# Patient Record
Sex: Female | Born: 1945 | Race: White | Hispanic: No | Smoking: Former smoker
Health system: Southern US, Community
[De-identification: ages and names within clinical notes are randomized; demographics above are authoritative.]

## PROBLEM LIST (undated history)

## (undated) DIAGNOSIS — R519 Headache, unspecified: Secondary | ICD-10-CM

## (undated) DIAGNOSIS — I429 Cardiomyopathy, unspecified: Secondary | ICD-10-CM

## (undated) DIAGNOSIS — I5042 Chronic combined systolic (congestive) and diastolic (congestive) heart failure: Secondary | ICD-10-CM

## (undated) DIAGNOSIS — I2510 Atherosclerotic heart disease of native coronary artery without angina pectoris: Secondary | ICD-10-CM

## (undated) DIAGNOSIS — C3490 Malignant neoplasm of unspecified part of unspecified bronchus or lung: Secondary | ICD-10-CM

## (undated) DIAGNOSIS — R51 Headache: Secondary | ICD-10-CM

## (undated) DIAGNOSIS — Z720 Tobacco use: Secondary | ICD-10-CM

## (undated) DIAGNOSIS — I714 Abdominal aortic aneurysm, without rupture, unspecified: Secondary | ICD-10-CM

## (undated) DIAGNOSIS — Z9289 Personal history of other medical treatment: Secondary | ICD-10-CM

## (undated) DIAGNOSIS — C50919 Malignant neoplasm of unspecified site of unspecified female breast: Secondary | ICD-10-CM

## (undated) DIAGNOSIS — D649 Anemia, unspecified: Secondary | ICD-10-CM

## (undated) DIAGNOSIS — I350 Nonrheumatic aortic (valve) stenosis: Secondary | ICD-10-CM

## (undated) DIAGNOSIS — I340 Nonrheumatic mitral (valve) insufficiency: Secondary | ICD-10-CM

## (undated) DIAGNOSIS — Z9221 Personal history of antineoplastic chemotherapy: Secondary | ICD-10-CM

## (undated) DIAGNOSIS — J449 Chronic obstructive pulmonary disease, unspecified: Secondary | ICD-10-CM

## (undated) DIAGNOSIS — R0600 Dyspnea, unspecified: Secondary | ICD-10-CM

## (undated) DIAGNOSIS — E7800 Pure hypercholesterolemia, unspecified: Secondary | ICD-10-CM

## (undated) HISTORY — DX: Abdominal aortic aneurysm, without rupture: I71.4

## (undated) HISTORY — PX: ABDOMINAL HYSTERECTOMY: SHX81

## (undated) HISTORY — DX: Abdominal aortic aneurysm, without rupture, unspecified: I71.40

## (undated) HISTORY — DX: Tobacco use: Z72.0

## (undated) HISTORY — DX: Malignant neoplasm of unspecified part of unspecified bronchus or lung: C34.90

---

## 2000-05-29 DIAGNOSIS — C50919 Malignant neoplasm of unspecified site of unspecified female breast: Secondary | ICD-10-CM

## 2000-05-29 HISTORY — DX: Malignant neoplasm of unspecified site of unspecified female breast: C50.919

## 2000-05-29 HISTORY — PX: MASTECTOMY: SHX3

## 2007-06-08 ENCOUNTER — Emergency Department: Payer: Self-pay | Admitting: Emergency Medicine

## 2007-09-04 ENCOUNTER — Ambulatory Visit: Payer: Self-pay

## 2011-04-11 ENCOUNTER — Ambulatory Visit: Payer: Self-pay | Admitting: "Endocrinology

## 2012-04-09 ENCOUNTER — Ambulatory Visit: Payer: Self-pay | Admitting: Family Medicine

## 2013-05-14 ENCOUNTER — Ambulatory Visit: Payer: Self-pay | Admitting: Ophthalmology

## 2014-04-15 ENCOUNTER — Ambulatory Visit: Payer: Self-pay | Admitting: Family Medicine

## 2014-05-15 ENCOUNTER — Ambulatory Visit: Payer: Self-pay | Admitting: Gastroenterology

## 2015-03-24 ENCOUNTER — Emergency Department
Admission: EM | Admit: 2015-03-24 | Discharge: 2015-03-24 | Disposition: A | Discharge status: Home / Self Care | Payer: Medicare Other | Attending: Emergency Medicine | Admitting: Emergency Medicine

## 2015-03-24 ENCOUNTER — Emergency Department: Payer: Medicare Other

## 2015-03-24 ENCOUNTER — Encounter: Payer: Self-pay | Admitting: *Deleted

## 2015-03-24 DIAGNOSIS — T148XXA Other injury of unspecified body region, initial encounter: Secondary | ICD-10-CM

## 2015-03-24 DIAGNOSIS — Y998 Other external cause status: Secondary | ICD-10-CM | POA: Insufficient documentation

## 2015-03-24 DIAGNOSIS — Y9289 Other specified places as the place of occurrence of the external cause: Secondary | ICD-10-CM | POA: Diagnosis not present

## 2015-03-24 DIAGNOSIS — S4992XA Unspecified injury of left shoulder and upper arm, initial encounter: Secondary | ICD-10-CM | POA: Diagnosis present

## 2015-03-24 DIAGNOSIS — X58XXXA Exposure to other specified factors, initial encounter: Secondary | ICD-10-CM | POA: Insufficient documentation

## 2015-03-24 DIAGNOSIS — S46812A Strain of other muscles, fascia and tendons at shoulder and upper arm level, left arm, initial encounter: Secondary | ICD-10-CM | POA: Diagnosis not present

## 2015-03-24 DIAGNOSIS — Z720 Tobacco use: Secondary | ICD-10-CM | POA: Insufficient documentation

## 2015-03-24 DIAGNOSIS — Y9389 Activity, other specified: Secondary | ICD-10-CM | POA: Diagnosis not present

## 2015-03-24 DIAGNOSIS — R110 Nausea: Secondary | ICD-10-CM | POA: Insufficient documentation

## 2015-03-24 HISTORY — DX: Pure hypercholesterolemia, unspecified: E78.00

## 2015-03-24 LAB — BASIC METABOLIC PANEL
ANION GAP: 9 (ref 5–15)
BUN: 13 mg/dL (ref 6–20)
CO2: 26 mmol/L (ref 22–32)
Calcium: 9.2 mg/dL (ref 8.9–10.3)
Chloride: 104 mmol/L (ref 101–111)
Creatinine, Ser: 0.88 mg/dL (ref 0.44–1.00)
GFR calc Af Amer: >60 mL/min (ref >60)
GLUCOSE: 98 mg/dL (ref 65–99)
POTASSIUM: 3.9 mmol/L (ref 3.5–5.1)
Sodium: 139 mmol/L (ref 135–145)

## 2015-03-24 LAB — CBC
HEMATOCRIT: 45.4 % (ref 35.0–47.0)
HEMOGLOBIN: 14.8 g/dL (ref 12.0–16.0)
MCH: 29.3 pg (ref 26.0–34.0)
MCHC: 32.5 g/dL (ref 32.0–36.0)
MCV: 90.2 fL (ref 80.0–100.0)
Platelets: 255 10*3/uL (ref 150–440)
RBC: 5.03 MIL/uL (ref 3.80–5.20)
RDW: 13.8 % (ref 11.5–14.5)
WBC: 7.9 10*3/uL (ref 3.6–11.0)

## 2015-03-24 LAB — TROPONIN I: Troponin I: <0.03 ng/mL (ref <0.031)

## 2015-03-24 MED ORDER — KETOROLAC TROMETHAMINE 30 MG/ML IJ SOLN
15.0000 mg | Freq: Once | INTRAMUSCULAR | Status: AC
  Administered 2015-03-24: 15 mg via INTRAVENOUS
  Filled 2015-03-24: qty 1

## 2015-03-24 NOTE — Discharge Instructions (Signed)
At this time, your pain appears to be from pulled muscles in her back and neck. There is no evidence at this time of a heart attack. However, if you becomes short of breath, have increased pain, feel nauseated, have any other new or worrisome symptoms, we advise you to please return to the emergency room

## 2015-03-24 NOTE — ED Notes (Signed)
Resumed care from tricia rn.  Pt alert and oriented.  Iv in place.  nsr on monitor.  Skin warm and dry.  Family with pt.

## 2015-03-24 NOTE — ED Notes (Signed)
Pt reports increasingly worse left arm pain, with nausea and intermittent pain to left chest.

## 2015-03-24 NOTE — ED Provider Notes (Addendum)
[facility] Emergency Department Provider Note  ____________________________________________   I have reviewed the triage vital signs and the nursing notes.   HISTORY  Chief Complaint Arm Pain and Nausea    HPI Roberta [name] is a 69 y.o. female with a history of COPD high cholesterol presents today with pain in her left arm. She actually has pain in her left shoulder as well. It is worse when she touches it worse when she changes position, has had slight nausea. To me, she denies chest pain, she states the pain is "right here" and she points to her trapezius muscle. She has not had any neck injuries she denies any numbness or weakness. She has not had this pain before. She woke up with it this morning as if she "slept wrong". It is not exertional she denies any fever or chills. It is worse when she changes position or lifts her arm is better when she lies still. She has not taken anything for it yet.  Past Medical History  Diagnosis Date  . Cancer ([place])   . High cholesterol     There are no active problems to display for this patient.   Past Surgical History  Procedure Laterality Date  . Mastectomy      No current outpatient prescriptions on file.  Allergies Review of patient's allergies indicates no known allergies.  No family history on file.  Social History Social History  Substance Use Topics  . Smoking status: Current Every Day Smoker  . Smokeless tobacco: None  . Alcohol Use: No    Review of Systems Constitutional: No fever/chills Eyes: No visual changes. ENT: No sore throat. No stiff neck no neck pain Cardiovascular: Denies chest pain. Respiratory: Denies shortness of breath. Gastrointestinal:   no vomiting.  No diarrhea.  No constipation. Genitourinary: Negative for dysuria. Musculoskeletal: Negative lower extremity swelling Skin: Negative for rash. Neurological: Negative for headaches, focal weakness or numbness. 10-point  ROS otherwise negative.  ____________________________________________   PHYSICAL EXAM:  VITAL SIGNS: ED Triage Vitals  Enc Vitals Group     BP 03/24/15 1308 96/76 mmHg     Pulse Rate 03/24/15 1308 64     Resp 03/24/15 1308 16     Temp 03/24/15 1308 97.6 F (36.4 C)     Temp Source 03/24/15 1308 Oral     SpO2 03/24/15 1308 98 %     Weight 03/24/15 1308 160 lb (72.576 kg)     Height 03/24/15 1308 '5\' 7"'$  (1.702 m)     Head Cir --      Peak Flow --      Pain Score 03/24/15 1305 8     Pain Loc --      Pain Edu? --      Excl. in [place]? --     Constitutional: Alert and oriented. Well appearing and in no acute distress. Eyes: Conjunctivae are normal. PERRL. EOMI. Head: Atraumatic. Nose: No congestion/rhinnorhea. Mouth/Throat: Mucous membranes are moist.  Oropharynx non-erythematous. Neck: No stridor.   Nontender with no meningismus Cardiovascular: Normal rate, regular rhythm. Grossly normal heart sounds.  Good peripheral circulation. Respiratory: Normal respiratory effort.  No retractions. Lungs CTAB. Gastrointestinal: Soft and nontender. No distention. No guarding no rebound Back:  There is no focal tenderness or step off there is no midline tenderness there are no lesions noted. there is no CVA tenderness Musculoskeletal: No lower extremity tenderness. No joint effusions, no DVT signs strong distal pulses no edema, patient has tenderness to  palpation in the distal trapezius muscle on the muscles around the scapula posteriorly. I touch this area patient jumps and states "ouch that's the pain right there" and lies back. She has no erythema or swelling there there is no crepitus there is no flail chest. It is not red or hot to touch. Strength is intact in the upper extremities strong distal pulses. Neurologic:  Normal speech and language. No gross focal neurologic deficits are appreciated.  Skin:  Skin is warm, dry and intact. No rash noted. Psychiatric: Mood and affect are normal. Speech  and behavior are normal.  ____________________________________________   LABS (all labs ordered are listed, but only abnormal results are displayed)  Labs Reviewed  CBC  BASIC METABOLIC PANEL  TROPONIN I   ____________________________________________  EKG I personally interpreted EKG, EKG performed by triage shows normal sinus rhythm rate 64 bpm no acute ST elevation or acute ST depression normal axis ____________________________________________  RADIOLOGY  Personally reviewed x-rays ____________________________________________   PROCEDURES  Procedure(s) performed: None  Critical Care performed: None  ____________________________________________   INITIAL IMPRESSION / ASSESSMENT AND PLAN / ED COURSE  Pertinent labs & imaging results that were available during my care of the patient were reviewed by me and considered in my medical decision making (see chart for details).  Patient here with very reproducible arm pain history of COPD history of high cholesterol, however very low suspicion that this is ACS. Patient is very reproducible pain in the exact muscle distributions that she describes the pain being in. It hurts when she ranges it hurts when I touch it. Hurts when she lifts her arm over her head. Nonetheless given her age and risk factors we will obtain a single set of cardiac enzymes and reassess. EKG is reassuring chest x-ray is reassuring nothing at this time to suggest PE dissection ACS myocarditis endocarditis or other acute pathology that Friday ____________________________________________  [facility] Emergency Department Provider Note  ____________________________________________   I have reviewed the triage vital signs and the nursing notes.  ----------------------------------------- 3:16 PM on 03/24/2015 -----------------------------------------  Patient with uninterrupted discomfort since 7:00 this morning, troponin is  negative, patient does not wish to stay for further cardiac enzymes. There was one blood pressure reading which was low but she was lying on the cuff and on immediate recheck, it was normalized again. Patient has had no chest pain, she has very reproducible pain in her trapezius muscle. Negative chest x-ray unremarkable EKG unremarkable blood work and the pain is nearly gone after Toradol. I did offer her admission to the hospital because of her age and she declines. She would prefer to follow up as an outpatient. She has had no worsening of symptoms here. And again this is quite reproducible. Return precautions and follow-up have been stressed and understood.   FINAL CLINICAL IMPRESSION(S) / ED DIAGNOSES  Final diagnoses:  None     Schuyler Amor, MD 03/24/15 1441  Schuyler Amor, MD 03/24/15 [phone]

## 2015-10-08 ENCOUNTER — Ambulatory Visit
Admission: RE | Admit: 2015-10-08 | Discharge: 2015-10-08 | Disposition: A | Discharge status: Home / Self Care | Payer: Medicare Other | Source: Ambulatory Visit | Attending: Family Medicine | Admitting: Family Medicine

## 2015-10-08 ENCOUNTER — Other Ambulatory Visit: Payer: Self-pay | Admitting: Family Medicine

## 2015-10-08 DIAGNOSIS — M25572 Pain in left ankle and joints of left foot: Secondary | ICD-10-CM

## 2016-03-23 ENCOUNTER — Ambulatory Visit
Admission: EM | Admit: 2016-03-23 | Discharge: 2016-03-23 | Disposition: A | Discharge status: Home / Self Care | Payer: Self-pay

## 2016-03-23 ENCOUNTER — Ambulatory Visit: Payer: Medicare Other

## 2016-03-23 ENCOUNTER — Encounter: Payer: Self-pay | Admitting: Emergency Medicine

## 2016-03-23 ENCOUNTER — Ambulatory Visit
Admission: EM | Admit: 2016-03-23 | Discharge: 2016-03-23 | Disposition: A | Discharge status: Home / Self Care | Payer: Medicare Other | Attending: Family Medicine | Admitting: Family Medicine

## 2016-03-23 DIAGNOSIS — R05 Cough: Secondary | ICD-10-CM | POA: Diagnosis not present

## 2016-03-23 DIAGNOSIS — R059 Cough, unspecified: Secondary | ICD-10-CM

## 2016-03-23 DIAGNOSIS — J441 Chronic obstructive pulmonary disease with (acute) exacerbation: Secondary | ICD-10-CM

## 2016-03-23 DIAGNOSIS — J45901 Unspecified asthma with (acute) exacerbation: Secondary | ICD-10-CM

## 2016-03-23 MED ORDER — FLUTICASONE-SALMETEROL 250-50 MCG/DOSE IN AEPB: 1.0000 | INHALATION_SPRAY | Freq: Two times a day (BID) | RESPIRATORY_TRACT | 0 refills | Status: DC

## 2016-03-23 MED ORDER — PREDNISONE 10 MG (21) PO TBPK: ORAL_TABLET | ORAL | 0 refills | Status: DC

## 2016-03-23 MED ORDER — ALBUTEROL SULFATE HFA 108 (90 BASE) MCG/ACT IN AERS: 2.0000 | INHALATION_SPRAY | Freq: Four times a day (QID) | RESPIRATORY_TRACT | 1 refills | Status: AC | PRN

## 2016-03-23 MED ORDER — IPRATROPIUM-ALBUTEROL 0.5-2.5 (3) MG/3ML IN SOLN
3.0000 mL | Freq: Four times a day (QID) | RESPIRATORY_TRACT | Status: DC
  Administered 2016-03-23: 3 mL via RESPIRATORY_TRACT

## 2016-03-23 MED ORDER — AZITHROMYCIN 250 MG PO TABS: ORAL_TABLET | ORAL | 0 refills | Status: DC

## 2016-03-23 NOTE — ED Provider Notes (Signed)
[facility]    CSN: [id number] Arrival date & time: 03/23/16  3299     History   Chief Complaint Chief Complaint  Patient presents with  . Cough    HPI Roberta [name] is a 70 y.o. female.   Patient is a 53 year old white female who presents with shortness of breath. She states that she's been short of breath since last week. Unfortunately patient is not a good historian. She was unable to confirm to the nurse that she's has COPD she states that she she uses 2 inhalers thousand 1 may be a nebulization treatment but she is not sure. Asked whether she's had to be on prednisone before but she is not sure that she is taking prednisone but she does not want a shot of Solu-Medrol any other type of injection at this time. She denies being acutely short of breath he low pulse ox is 86. She has an appointment to see her doctor secondary to November but explained to her that she may need to see her doctor before then. When asks for sputum production she states she's coughing up stuff but she does know her colors she states a cystlike regular sputum scan will come in the meantime she has exacerbations of his COPD but she does still smoke. She's not allergic to anything. She's had breast cancer and there are family members who've had colon cancer before in the past.     The history is provided by the patient. No language interpreter was used.  Cough  Cough characteristics:  Productive Sputum characteristics:  Nondescript Severity:  Moderate Onset quality:  Sudden Timing:  Constant Chronicity:  New Smoker: no   Context: smoke exposure and upper respiratory infection   Context: not sick contacts   Relieved by:  Nothing Worsened by:  Deep breathing Ineffective treatments:  Beta-agonist inhaler Associated symptoms: shortness of breath and wheezing     Past Medical History:  Diagnosis Date  . Cancer ([place])   . High cholesterol     There are no active problems to display  for this patient.   Past Surgical History:  Procedure Laterality Date  . MASTECTOMY      OB History    No data available       Home Medications    Prior to Admission medications   Medication Sig Start Date End Date Taking? Authorizing Provider  Atorvastatin Calcium (LIPITOR PO) Take by mouth.   Yes Historical Provider, MD  albuterol (PROVENTIL HFA;VENTOLIN HFA) 108 (90 Base) MCG/ACT inhaler Inhale 2 puffs into the lungs every 6 (six) hours as needed for wheezing or shortness of breath. 03/23/16   Frederich Cha, MD  azithromycin (ZITHROMAX Z-PAK) 250 MG tablet Take 2 tablets first day and then 1 po a day for 4 days 03/23/16   Frederich Cha, MD  Fluticasone-Salmeterol (ADVAIR DISKUS) 250-50 MCG/DOSE AEPB Inhale 1 puff into the lungs 2 (two) times daily. 03/23/16   Frederich Cha, MD  predniSONE (STERAPRED UNI-PAK 21 TAB) 10 MG (21) TBPK tablet Sig 6 tablet day 1, 5 tablets day 2, 4 tablets day 3,,3tablets day 4, 2 tablets day 5, 1 tablet day 6 take all tablets orally 03/23/16   Frederich Cha, MD    Family History No family history on file.  Social History Social History  Substance Use Topics  . Smoking status: Current Every Day Smoker    Packs/day: 0.50    Types: Cigarettes  . Smokeless tobacco: Never Used  . Alcohol use No  Allergies   Review of patient's allergies indicates no known allergies.   Review of Systems Review of Systems  Respiratory: Positive for cough, shortness of breath and wheezing.   All other systems reviewed and are negative.    Physical Exam Triage Vital Signs ED Triage Vitals  Enc Vitals Group     BP 03/23/16 0853 120/61     Pulse Rate 03/23/16 0853 86     Resp 03/23/16 0853 18     Temp 03/23/16 0853 98 F (36.7 C)     Temp Source 03/23/16 0853 Tympanic     SpO2 03/23/16 0901 (!) 86 %     Weight 03/23/16 0854 150 lb (68 kg)     Height 03/23/16 0854 '5\' 7"'$  (1.702 m)     Head Circumference --      Peak Flow --      Pain Score --      Pain  Loc --      Pain Edu? --      Excl. in [place]? --    No data found.   Updated Vital Signs BP 120/61   Pulse 86   Temp 98 F (36.7 C) (Tympanic)   Resp 18   Ht '5\' 7"'$  (1.702 m)   Wt 150 lb (68 kg)   SpO2 (!) 89% Comment: MD aware   BMI 23.49 kg/m   Visual Acuity Right Eye Distance:   Left Eye Distance:   Bilateral Distance:    Right Eye Near:   Left Eye Near:    Bilateral Near:     Physical Exam  Constitutional: She is oriented to person, place, and time. She appears well-developed and well-nourished. No distress.  HENT:  Head: Normocephalic and atraumatic.  Eyes: Conjunctivae are normal. Pupils are equal, round, and reactive to light.  Neck: Neck supple.  Cardiovascular: Normal rate and regular rhythm.   Pulmonary/Chest: She is in respiratory distress. She has wheezes. She has no rales.  Musculoskeletal: Normal range of motion.  Neurological: She is alert and oriented to person, place, and time. She has normal reflexes.  Skin: Skin is warm and dry. She is not diaphoretic. No erythema.  Psychiatric: She has a normal mood and affect.  Vitals reviewed.    UC Treatments / Results  Labs (all labs ordered are listed, but only abnormal results are displayed) Labs Reviewed - No data to display  EKG  EKG Interpretation None       Radiology No results found.  Procedures Procedures (including critical care time)  Medications Ordered in UC Medications  ipratropium-albuterol (DUONEB) 0.5-2.5 (3) MG/3ML nebulizer solution 3 mL (3 mLs Nebulization Given 03/23/16 0900)     Initial Impression / Assessment and Plan / UC Course  I have reviewed the triage vital signs and the nursing notes.  Pertinent labs & imaging results that were available during my care of the patient were reviewed by me and considered in my medical decision making (see chart for details).  Clinical Course   Patient was given DuoNeb treatment here. Pulse ox went from 86 to 88. Once again it  should be noted the patient has refused injection of Solu-Medrol. In trying to review her chart apparently she's not completely in the Epic system I have seen with a few Zithromax before on her and she's been on Advair inhaler and albuterol inhaler. Will give a prescription for albuterol inhaler and Advair as well. We'll place on 6 day course of prednisone and I'll place him on a  Z-Pak. Strongly suggest that she follow-up with her PCP next week and not wait until the second week of November. Also she gets worse she should go to the ED of her choice. Should be noted that patient denies feeling short of breath even though pulse ox is only 88   Final Clinical Impressions(s) / UC Diagnoses   Final diagnoses:  Acute exacerbation of COPD with asthma ([place])  Cough    New Prescriptions Discharge Medication List as of 03/23/2016  9:21 AM    START taking these medications   Details  albuterol (PROVENTIL HFA;VENTOLIN HFA) 108 (90 Base) MCG/ACT inhaler Inhale 2 puffs into the lungs every 6 (six) hours as needed for wheezing or shortness of breath., Starting Thu 03/23/2016, Print    azithromycin (ZITHROMAX Z-PAK) 250 MG tablet Take 2 tablets first day and then 1 po a day for 4 days, Print    Fluticasone-Salmeterol (ADVAIR DISKUS) 250-50 MCG/DOSE AEPB Inhale 1 puff into the lungs 2 (two) times daily., Starting Thu 03/23/2016, Print    predniSONE (STERAPRED UNI-PAK 21 TAB) 10 MG (21) TBPK tablet Sig 6 tablet day 1, 5 tablets day 2, 4 tablets day 3,,3tablets day 4, 2 tablets day 5, 1 tablet day 6 take all tablets orally, Print         Frederich Cha, MD 03/23/16 [phone]

## 2016-03-23 NOTE — ED Triage Notes (Signed)
Pt reports cough and congestion ongoing over last week. Pt denies known fever reports out of inhaler has had some SOB

## 2016-03-26 ENCOUNTER — Telehealth: Payer: Self-pay | Admitting: *Deleted

## 2016-04-11 ENCOUNTER — Other Ambulatory Visit: Payer: Self-pay | Admitting: Medical Oncology

## 2016-04-11 DIAGNOSIS — M8580 Other specified disorders of bone density and structure, unspecified site: Secondary | ICD-10-CM

## 2016-04-11 DIAGNOSIS — Z780 Asymptomatic menopausal state: Secondary | ICD-10-CM

## 2016-10-22 ENCOUNTER — Encounter: Payer: Self-pay | Admitting: Emergency Medicine

## 2016-10-22 ENCOUNTER — Emergency Department
Admission: EM | Admit: 2016-10-22 | Discharge: 2016-10-22 | Disposition: A | Discharge status: Home / Self Care | Payer: Medicare Other | Attending: Emergency Medicine | Admitting: Emergency Medicine

## 2016-10-22 ENCOUNTER — Emergency Department: Payer: Medicare Other

## 2016-10-22 DIAGNOSIS — I714 Abdominal aortic aneurysm, without rupture, unspecified: Secondary | ICD-10-CM

## 2016-10-22 DIAGNOSIS — N10 Acute tubulo-interstitial nephritis: Secondary | ICD-10-CM | POA: Insufficient documentation

## 2016-10-22 DIAGNOSIS — J449 Chronic obstructive pulmonary disease, unspecified: Secondary | ICD-10-CM | POA: Diagnosis not present

## 2016-10-22 DIAGNOSIS — F17210 Nicotine dependence, cigarettes, uncomplicated: Secondary | ICD-10-CM | POA: Diagnosis not present

## 2016-10-22 DIAGNOSIS — Z7951 Long term (current) use of inhaled steroids: Secondary | ICD-10-CM | POA: Insufficient documentation

## 2016-10-22 DIAGNOSIS — Z79899 Other long term (current) drug therapy: Secondary | ICD-10-CM | POA: Diagnosis not present

## 2016-10-22 DIAGNOSIS — Z7982 Long term (current) use of aspirin: Secondary | ICD-10-CM | POA: Diagnosis not present

## 2016-10-22 DIAGNOSIS — N12 Tubulo-interstitial nephritis, not specified as acute or chronic: Secondary | ICD-10-CM

## 2016-10-22 DIAGNOSIS — R1032 Left lower quadrant pain: Secondary | ICD-10-CM | POA: Diagnosis present

## 2016-10-22 HISTORY — DX: Chronic obstructive pulmonary disease, unspecified: J44.9

## 2016-10-22 LAB — URINALYSIS, COMPLETE (UACMP) WITH MICROSCOPIC
BILIRUBIN URINE: NEGATIVE
Glucose, UA: NEGATIVE mg/dL
KETONES UR: NEGATIVE mg/dL
Nitrite: POSITIVE — AB
PROTEIN: 30 mg/dL — AB
Specific Gravity, Urine: 1.011 (ref 1.005–1.030)
pH: 5 (ref 5.0–8.0)

## 2016-10-22 LAB — CBC WITH DIFFERENTIAL/PLATELET
BASOS ABS: 0 10*3/uL (ref 0–0.1)
BASOS PCT: 0 %
EOS PCT: 0 %
Eosinophils Absolute: 0 10*3/uL (ref 0–0.7)
HCT: 38.1 % (ref 35.0–47.0)
Hemoglobin: 12.8 g/dL (ref 12.0–16.0)
Lymphocytes Relative: 8 %
Lymphs Abs: 0.9 10*3/uL — ABNORMAL LOW (ref 1.0–3.6)
MCH: 29.3 pg (ref 26.0–34.0)
MCHC: 33.6 g/dL (ref 32.0–36.0)
MCV: 87.2 fL (ref 80.0–100.0)
MONO ABS: 1.1 10*3/uL — AB (ref 0.2–0.9)
Monocytes Relative: 10 %
Neutro Abs: 9.3 10*3/uL — ABNORMAL HIGH (ref 1.4–6.5)
Neutrophils Relative %: 82 %
PLATELETS: 279 10*3/uL (ref 150–440)
RBC: 4.37 MIL/uL (ref 3.80–5.20)
RDW: 14.2 % (ref 11.5–14.5)
WBC: 11.3 10*3/uL — ABNORMAL HIGH (ref 3.6–11.0)

## 2016-10-22 LAB — COMPREHENSIVE METABOLIC PANEL
ALT: 18 U/L (ref 14–54)
AST: 26 U/L (ref 15–41)
Albumin: 2.9 g/dL — ABNORMAL LOW (ref 3.5–5.0)
Alkaline Phosphatase: 98 U/L (ref 38–126)
Anion gap: 7 (ref 5–15)
BUN: 15 mg/dL (ref 6–20)
CO2: 27 mmol/L (ref 22–32)
Calcium: 8.5 mg/dL — ABNORMAL LOW (ref 8.9–10.3)
Chloride: 104 mmol/L (ref 101–111)
Creatinine, Ser: 0.95 mg/dL (ref 0.44–1.00)
GFR calc Af Amer: >60 mL/min (ref >60)
GFR calc non Af Amer: 59 mL/min — ABNORMAL LOW (ref >60)
Glucose, Bld: 132 mg/dL — ABNORMAL HIGH (ref 65–99)
Potassium: 3.6 mmol/L (ref 3.5–5.1)
Sodium: 138 mmol/L (ref 135–145)
Total Bilirubin: 0.5 mg/dL (ref 0.3–1.2)
Total Protein: 6.5 g/dL (ref 6.5–8.1)

## 2016-10-22 LAB — LACTIC ACID, PLASMA: LACTIC ACID, VENOUS: 1.8 mmol/L (ref 0.5–1.9)

## 2016-10-22 LAB — LIPASE, BLOOD: Lipase: 16 U/L (ref 11–51)

## 2016-10-22 MED ORDER — IOPAMIDOL (ISOVUE-300) INJECTION 61%
30.0000 mL | Freq: Once | INTRAVENOUS | Status: AC | PRN
  Administered 2016-10-22: 30 mL via ORAL

## 2016-10-22 MED ORDER — ACETAMINOPHEN 325 MG PO TABS
650.0000 mg | ORAL_TABLET | Freq: Once | ORAL | Status: AC
  Administered 2016-10-22: 650 mg via ORAL

## 2016-10-22 MED ORDER — CIPROFLOXACIN HCL 500 MG PO TABS: 500.0000 mg | ORAL_TABLET | Freq: Two times a day (BID) | ORAL | 0 refills | Status: AC

## 2016-10-22 MED ORDER — ACETAMINOPHEN 325 MG PO TABS
ORAL_TABLET | ORAL | Status: AC
  Administered 2016-10-22: 650 mg via ORAL
  Filled 2016-10-22: qty 2

## 2016-10-22 MED ORDER — IOPAMIDOL (ISOVUE-300) INJECTION 61%
100.0000 mL | Freq: Once | INTRAVENOUS | Status: AC | PRN
  Administered 2016-10-22: 100 mL via INTRAVENOUS

## 2016-10-22 NOTE — ED Provider Notes (Addendum)
[facility] Emergency Department Provider Note   ____________________________________________   None    (approximate)  I have reviewed the triage vital signs and the nursing notes.   HISTORY  Chief Complaint Abdominal Pain    HPI Roberta [name] is a 71 y.o. female who reports onset of left lower quadrant pain Friday morning. She has no nausea vomiting or diarrhea is not running a fever. She says she does occasionally have low blood pressure in the emergency room itself blood pressure is back up to 329 systolic. Pain is sore and achy in the left lower quadrant made worse with movement. It is not severe and patient does not want anything for pain medicine at the present time. Patient does have a mild headache and requested Tylenol for the headache   Past Medical History:  Diagnosis Date  . Cancer [facility])    left breast cancer  . COPD (chronic obstructive pulmonary disease) ([place])   . High cholesterol     There are no active problems to display for this patient.   Past Surgical History:  Procedure Laterality Date  . MASTECTOMY Left     Prior to Admission medications   Medication Sig Start Date End Date Taking? Authorizing Provider  ADVAIR HFA 115-21 MCG/ACT inhaler Inhale 2 puffs into the lungs 2 (two) times daily.  09/26/16  Yes [provider]  albuterol (PROVENTIL HFA;VENTOLIN HFA) 108 (90 Base) MCG/ACT inhaler Inhale 2 puffs into the lungs every 6 (six) hours as needed for wheezing or shortness of breath. 03/23/16  Yes Frederich Cha, MD  aspirin EC 81 MG tablet Take 81 mg by mouth daily.   Yes [provider]  atorvastatin (LIPITOR) 10 MG tablet Take 10 mg by mouth daily. 08/11/16  Yes [provider]  ciprofloxacin (CIPRO) 500 MG tablet Take 1 tablet (500 mg total) by mouth 2 (two) times daily. 10/22/16 11/01/16  Nena Polio, MD  Fluticasone-Salmeterol (ADVAIR DISKUS) 250-50 MCG/DOSE AEPB Inhale 1 puff into the lungs 2  (two) times daily. Patient not taking: Reported on 10/22/2016 03/23/16   Frederich Cha, MD    Allergies Patient has no known allergies.  No family history on file.  Social History Social History  Substance Use Topics  . Smoking status: Current Every Day Smoker    Packs/day: 0.50    Types: Cigarettes  . Smokeless tobacco: Never Used  . Alcohol use No    Review of Systems  Constitutional: No fever/chills Eyes: No visual changes. ENT: No sore throat. Cardiovascular: Denies chest pain. Respiratory: Denies shortness of breath. Gastrointestinal: See history of present illness Genitourinary: Negative for dysuria. Musculoskeletal: Negative for back pain. Skin: Negative for rash. Neurological: Negative for, focal weakness or numbness.   ____________________________________________   PHYSICAL EXAM:  VITAL SIGNS: ED Triage Vitals  Enc Vitals Group     BP 10/22/16 0825 (!) 99/50     Pulse Rate 10/22/16 0825 88     Resp 10/22/16 0825 16     Temp 10/22/16 0825 98.9 F (37.2 C)     Temp Source 10/22/16 0825 Oral     SpO2 10/22/16 0825 92 %     Weight 10/22/16 0824 150 lb (68 kg)     Height 10/22/16 0824 5\' 7"  (1.702 m)     Head Circumference --      Peak Flow --      Pain Score 10/22/16 0824 7     Pain Loc --  Pain Edu? --      Excl. in [place]? --     Constitutional: Alert and oriented. Well appearing and in no acute distress. Eyes: Conjunctivae are normal. Head: Atraumatic. Nose: No congestion/rhinnorhea. Mouth/Throat: Mucous membranes are moist.   Neck: No stridor.   Cardiovascular: Normal rate, regular rhythm. Grossly normal heart sounds.  Good peripheral circulation. Respiratory: Normal respiratory effort.  No retractions. Lungs CTAB. Gastrointestinal: Soft Tender to palpation percussion in the left lower quadrant No distention. No abdominal bruits. No CVA tenderness. Musculoskeletal: No lower extremity tenderness nor edema.  No joint effusions. Neurologic:   Normal speech and language. No gross focal neurologic deficits are appreciated.  Skin:  Skin is warm, dry and intact. No rash noted. Psychiatric: Mood and affect are normal. Speech and behavior are normal.  ____________________________________________   LABS (all labs ordered are listed, but only abnormal results are displayed)  Labs Reviewed  COMPREHENSIVE METABOLIC PANEL - Abnormal; Notable for the following:       Result Value   Glucose, Bld 132 (*)    Calcium 8.5 (*)    Albumin 2.9 (*)    GFR calc non Af Amer 59 (*)    All other components within normal limits  URINALYSIS, COMPLETE (UACMP) WITH MICROSCOPIC - Abnormal; Notable for the following:    Color, Urine YELLOW (*)    APPearance CLOUDY (*)    Hgb urine dipstick MODERATE (*)    Protein, ur 30 (*)    Nitrite POSITIVE (*)    Leukocytes, UA LARGE (*)    Bacteria, UA RARE (*)    Squamous Epithelial / LPF 6-30 (*)    All other components within normal limits  CBC WITH DIFFERENTIAL/PLATELET - Abnormal; Notable for the following:    WBC 11.3 (*)    Neutro Abs 9.3 (*)    Lymphs Abs 0.9 (*)    Monocytes Absolute 1.1 (*)    All other components within normal limits  LIPASE, BLOOD  LACTIC ACID, PLASMA  LACTIC ACID, PLASMA   ____________________________________________  EKG   ____________________________________________  RADIOLOGY  Study Result   CLINICAL DATA:  Left lower quadrant pain, fever, chills  EXAM: CT ABDOMEN AND PELVIS WITH CONTRAST  TECHNIQUE: Multidetector CT imaging of the abdomen and pelvis was performed using the standard protocol following bolus administration of intravenous contrast.  CONTRAST:  176mL ISOVUE-300 IOPAMIDOL (ISOVUE-300) INJECTION 61%  COMPARISON:  None.  FINDINGS: Lower chest: Lung bases are clear.  Hepatobiliary: Liver is within normal limits.  Gallbladder is unremarkable. No intrahepatic or extrahepatic ductal dilatation.  Pancreas: Within normal  limits.  Spleen: Within normal limits.  Adrenals/Urinary Tract: Adrenal glands are within normal limits.  Heterogeneous perfusion involving the bilateral kidneys, right greater than left, best visualized on the delayed images (series 7), compatible with pyelonephritis. No hydronephrosis.  Mildly thick-walled bladder.  Stomach/Bowel: Stomach is within normal limits.  No evidence of bowel obstruction.  Normal appendix (series 2/ image 60).  Sigmoid diverticulosis, without evidence of diverticulitis.  Vascular/Lymphatic: 5.0 x 4.8 cm infrarenal abdominal aortic aneurysm (series 2/ image 32).  Atherosclerotic calcifications of the abdominal aorta and branch vessels.  No suspicious abdominopelvic lymphadenopathy.  Reproductive: Uterus is within normal limits.  Bilateral ovaries are within normal limits.  Other: No abdominopelvic ascites.  Musculoskeletal: Mild degenerative changes of the visualized thoracolumbar spine.  IMPRESSION: Heterogeneous perfusion involving the bilateral kidneys, right greater than left, compatible with pyelonephritis.  Mild bladder wall thickening, likely reflecting cystitis.  **An incidental finding of potential  clinical significance has been found. 5.0 x 4.8 cm infrarenal abdominal aortic aneurysm. Recommend followup by abdomen and pelvis CTA in 3-6 months, and vascular surgery referral/consultation if not already obtained. This recommendation follows ACR consensus guidelines: White Paper of the ACR Incidental Findings Committee II on Vascular Findings. J Am Coll Radiol 2013; 10:789-794.**   Electronically Signed   By: Julian Hy M.D.   On: 10/22/2016 10:10    ____________________________________________   PROCEDURES  Procedure(s) performed:   Procedures  Critical Care performed:   ____________________________________________   INITIAL IMPRESSION / ASSESSMENT AND PLAN / ED COURSE  Pertinent labs  & imaging results that were available during my care of the patient were reviewed by me and considered in my medical decision making (see chart for details).   Patient's pain is low in the left lower quadrant. There is no mid abdominal tenderness at all. Discussed in detail the patient's CT findings and the course of treatment and what to do of her belly pain gets worse. We'll let her go with follow-up. I spoke with Dr. Synthia Innocent CO the vascular pain doctor on call who will expedite follow-up.     ____________________________________________   FINAL CLINICAL IMPRESSION(S) / ED DIAGNOSES  Final diagnoses:  Pyelonephritis  Abdominal aortic aneurysm (AAA) without rupture ([facility])      NEW MEDICATIONS STARTED DURING THIS VISIT:  New Prescriptions   CIPROFLOXACIN (CIPRO) 500 MG TABLET    Take 1 tablet (500 mg total) by mouth 2 (two) times daily.     Note:  This document was prepared using Dragon voice recognition software and may include unintentional dictation errors.    Nena Polio, MD 10/22/16 1033    Nena Polio, MD 10/22/16 1035 Patient is getting be discharged had her arm up and blood pressure was low. Her arm down and blood pressure is higher all above 100. Patient reports that her blood pressure usually is low. We'll get a couple more blood pressures to make sure her blood pressure maintains close to her normal range before discharge her.   Nena Polio, MD 10/23/16 [phone]

## 2016-10-22 NOTE — ED Triage Notes (Signed)
C/O LLQ abdominal pain.  Onset of symptoms Friday morning at 0200.  Denies N/V/D

## 2016-10-22 NOTE — Discharge Instructions (Signed)
It looks like the belly pain near having is from the bladder and kidney infection at you have. I will give you some Cipro twice a day to help take care of that. Drink plenty of fluids with it. When we did the CAT scan we also found a fairly large aneurysm and your aorta the big blood vessel or comes out of your heart and goes down to your legs. It will need close follow-up. If it gets bigger it could rupture and kill you. Right now it is not in danger of rupturing though. I will ask you to follow-up with [facility]. Dr. Lorenso Courier is on-call today. Call on Tuesday and tell them you were in the emergency room and we found an aneurysm in your abdominal aorta that will need follow-up. They should be able to get you in quickly.  Occasionally the Cipro will cause weakness in your tendons. If you get a lot of pain in your arms or legs stop the Cipro and come back.. Very rarely the tendons can rupture.  Please also come back for fever vomiting and not being able to keep down the medicine or if you have bad belly pain. His belly pain comes on suddenly and radiates through to your back: A month and come in immediately.

## 2016-11-02 ENCOUNTER — Encounter (INDEPENDENT_AMBULATORY_CARE_PROVIDER_SITE_OTHER): Payer: Self-pay | Admitting: Vascular Surgery

## 2016-11-02 ENCOUNTER — Ambulatory Visit (INDEPENDENT_AMBULATORY_CARE_PROVIDER_SITE_OTHER): Payer: Medicare Other | Admitting: Vascular Surgery

## 2016-11-02 DIAGNOSIS — E785 Hyperlipidemia, unspecified: Secondary | ICD-10-CM | POA: Insufficient documentation

## 2016-11-02 DIAGNOSIS — E782 Mixed hyperlipidemia: Secondary | ICD-10-CM

## 2016-11-02 DIAGNOSIS — I739 Peripheral vascular disease, unspecified: Secondary | ICD-10-CM | POA: Insufficient documentation

## 2016-11-02 DIAGNOSIS — J449 Chronic obstructive pulmonary disease, unspecified: Secondary | ICD-10-CM | POA: Insufficient documentation

## 2016-11-02 DIAGNOSIS — I714 Abdominal aortic aneurysm, without rupture, unspecified: Secondary | ICD-10-CM

## 2016-11-02 NOTE — Progress Notes (Signed)
MRN : [id number]  Roberta [name] is a 71 y.o. (April 29, 1946) female who presents with chief complaint of AAA.  History of Present Illness: The patient presents to the office for evaluation of an abdominal aortic aneurysm. She was seen in the ER and treated for a UTI.  CT was obtained because of abdominal pain.  The aneurysm was found incidentally by CT scan. Patient denies abdominal pain or unusual back pain, no other abdominal complaints.  No history of an acute onset of painful blue discoloration of the toes.     No family history of AAA.   Patient denies amaurosis fugax or TIA symptoms. There is no history of claudication or rest pain symptoms of the lower extremities.  The patient denies angina or shortness of breath.  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    Past Medical History:  Diagnosis Date  . Cancer [facility])    left breast cancer  . COPD (chronic obstructive pulmonary disease) ([place])   . High cholesterol     Past Surgical History:  Procedure Laterality Date  . MASTECTOMY Left     Social History Social History  Substance Use Topics  . Smoking status: Current Every Day Smoker    Packs/day: 0.50    Types: Cigarettes  . Smokeless tobacco: Never Used  . Alcohol use No    Family History No family history on file. No family history of bleeding/clotting disorders, porphyria or autoimmune disease  No Known Allergies   REVIEW OF SYSTEMS (Negative unless checked)  Constitutional: [] Weight loss  [] Fever  [] Chills Cardiac: [] Chest pain   [] Chest pressure   [] Palpitations   [] Shortness of breath when laying flat   [] Shortness of breath with exertion. Vascular:  [] Pain in legs with walking   [] Pain in legs at rest  [] History of DVT   [] Phlebitis   [x] Swelling in legs   [] Varicose  veins   [] Non-healing ulcers Pulmonary:   [] Uses home oxygen   [] Productive cough   [] Hemoptysis   [] Wheeze  [] COPD   [] Asthma Neurologic:  [] Dizziness   [] Seizures   [] History of stroke   [] History of TIA  [] Aphasia   [] Vissual changes   [] Weakness or numbness in arm   [] Weakness or numbness in leg Musculoskeletal:   [] Joint swelling   [x] Joint pain   [] Low back pain Hematologic:  [] Easy bruising  [] Easy bleeding   [] Hypercoagulable state   [] Anemic Gastrointestinal:  [] Diarrhea   [] Vomiting  [] Gastroesophageal reflux/heartburn   [] Difficulty swallowing. Genitourinary:  [] Chronic kidney disease   [] Difficult urination  [] Frequent urination   [] Blood in urine Skin:  [] Rashes   [] Ulcers  Psychological:  [] History of anxiety   []  History of major depression.  Physical Examination  There were no vitals filed for this visit. There is no height or weight on file to calculate BMI. Gen: WD/WN, NAD Head: [place]/AT, No temporalis wasting.  Ear/Nose/Throat: Hearing grossly intact, nares w/o erythema or drainage, poor dentition Eyes: PER, EOMI, sclera nonicteric.  Neck: Supple, no masses.  No bruit or JVD.  Pulmonary:  Good air movement, clear to auscultation bilaterally, no use of accessory muscles.  Cardiac: RRR, normal S1, S2, no Murmurs. Vascular:  Vessel Right Left  Radial Palpable Palpable  Ulnar Palpable Palpable  Brachial Palpable Palpable  Carotid Palpable  Palpable  Femoral Palpable Palpable  Popliteal Not Palpable Not Palpable  PT Not Palpable Not Palpable  DP Not Palpable Not Palpable   Gastrointestinal: soft, non-distended. No guarding/no peritoneal signs.  Musculoskeletal: M/S 5/5 throughout.  No deformity or atrophy.  Neurologic: CN 2-12 intact. Pain and light touch intact in extremities.  Symmetrical.  Speech is fluent. Motor exam as listed above. Psychiatric: Judgment intact, Mood & affect appropriate for pt's clinical situation. Dermatologic: No rashes or ulcers noted.  No  changes consistent with cellulitis. Lymph : No Cervical lymphadenopathy, no lichenification or skin changes of chronic lymphedema.  CBC Lab Results  Component Value Date   WBC 11.3 (H) 10/22/2016   HGB 12.8 10/22/2016   HCT 38.1 10/22/2016   MCV 87.2 10/22/2016   PLT 279 10/22/2016    BMET    Component Value Date/Time   NA 138 10/22/2016 0848   K 3.6 10/22/2016 0848   CL 104 10/22/2016 0848   CO2 27 10/22/2016 0848   GLUCOSE 132 (H) 10/22/2016 0848   BUN 15 10/22/2016 0848   CREATININE 0.95 10/22/2016 0848   CALCIUM 8.5 (L) 10/22/2016 0848   GFRNONAA 59 (L) 10/22/2016 0848   GFRAA >60 10/22/2016 0848   Estimated Creatinine Clearance: 53.6 mL/min (by C-G formula based on SCr of 0.95 mg/dL).  COAG No results found for: INR, PROTIME  Radiology Ct Abdomen Pelvis W Contrast  Result Date: 10/22/2016 CLINICAL DATA:  Left lower quadrant pain, fever, chills EXAM: CT ABDOMEN AND PELVIS WITH CONTRAST TECHNIQUE: Multidetector CT imaging of the abdomen and pelvis was performed using the standard protocol following bolus administration of intravenous contrast. CONTRAST:  121mL ISOVUE-300 IOPAMIDOL (ISOVUE-300) INJECTION 61% COMPARISON:  None. FINDINGS: Lower chest: Lung bases are clear. Hepatobiliary: Liver is within normal limits. Gallbladder is unremarkable. No intrahepatic or extrahepatic ductal dilatation. Pancreas: Within normal limits. Spleen: Within normal limits. Adrenals/Urinary Tract: Adrenal glands are within normal limits. Heterogeneous perfusion involving the bilateral kidneys, right greater than left, best visualized on the delayed images (series 7), compatible with pyelonephritis. No hydronephrosis. Mildly thick-walled bladder. Stomach/Bowel: Stomach is within normal limits. No evidence of bowel obstruction. Normal appendix (series 2/ image 60). Sigmoid diverticulosis, without evidence of diverticulitis. Vascular/Lymphatic: 5.0 x 4.8 cm infrarenal abdominal aortic aneurysm (series  2/ image 32). Atherosclerotic calcifications of the abdominal aorta and branch vessels. No suspicious abdominopelvic lymphadenopathy. Reproductive: Uterus is within normal limits. Bilateral ovaries are within normal limits. Other: No abdominopelvic ascites. Musculoskeletal: Mild degenerative changes of the visualized thoracolumbar spine. IMPRESSION: Heterogeneous perfusion involving the bilateral kidneys, right greater than left, compatible with pyelonephritis. Mild bladder wall thickening, likely reflecting cystitis. **An incidental finding of potential clinical significance has been found. 5.0 x 4.8 cm infrarenal abdominal aortic aneurysm. Recommend followup by abdomen and pelvis CTA in 3-6 months, and vascular surgery referral/consultation if not already obtained. This recommendation follows ACR consensus guidelines: White Paper of the ACR Incidental Findings Committee II on Vascular Findings. J Am Coll Radiol 2013; 10:789-794.** Electronically Signed   By: Julian Hy M.D.   On: 10/22/2016 10:10     Assessment/Plan 1. AAA (abdominal aortic aneurysm) without rupture ([place]) Recommend: The aneurysm is > 5 cm and therefore should undergo repair. Patient is status post CT scan of the abdominal aorta. The patient is a candidate for endovascular repair.   He will require cardiac clearance.   The patient will continue antiplatelet therapy as prescribed (since the patient is undergoing endovascular repair as opposed to open repair) as well  as aggressive management of hyperlipidemia. Exercise is again strongly encouraged.   The patient is reminded that lifetime routine surveillance is a necessity with an endograft.   The risks and benefits of AAA repair are reviewed with the patient.  All questions are answered.  Alternative therapies are also discussed.  The patient agrees to proceed with endovascular aneurysm repair.  Patient will follow-up with me in the office after the surgery.  - Ambulatory  referral to Cardiology  2. PAD (peripheral artery disease) (HCC)  Recommend:  The patient has evidence of atherosclerosis of the lower extremities with claudication.  The patient does not voice lifestyle limiting changes at this point in time.  Noninvasive studies do not suggest clinically significant change.  No invasive studies, angiography or surgery at this time The patient should continue walking and begin a more formal exercise program.  The patient should continue antiplatelet therapy and aggressive treatment of the lipid abnormalities  No changes in the patient's medications at this time  The patient should continue wearing graduated compression socks 10-15 mmHg strength to control the mild edema.   - VAS [place] ABI WITH/WO TBI; Future  3. COPD mixed type ([place]) Continue pulmonary medications and aerosols as already ordered, these medications have been reviewed and there are no changes at this time.    4. Mixed hyperlipidemia Continue statin as ordered and reviewed, no changes at this time     Hortencia Pilar, MD  11/02/2016 8:00 AM

## 2016-11-07 ENCOUNTER — Other Ambulatory Visit (INDEPENDENT_AMBULATORY_CARE_PROVIDER_SITE_OTHER): Payer: Medicare Other

## 2016-11-07 DIAGNOSIS — I739 Peripheral vascular disease, unspecified: Secondary | ICD-10-CM | POA: Diagnosis not present

## 2016-11-08 ENCOUNTER — Encounter (INDEPENDENT_AMBULATORY_CARE_PROVIDER_SITE_OTHER): Payer: Self-pay

## 2016-11-20 ENCOUNTER — Other Ambulatory Visit: Payer: Self-pay | Admitting: Family Medicine

## 2016-11-20 ENCOUNTER — Other Ambulatory Visit (INDEPENDENT_AMBULATORY_CARE_PROVIDER_SITE_OTHER): Payer: Self-pay | Admitting: Vascular Surgery

## 2016-11-20 DIAGNOSIS — Z1231 Encounter for screening mammogram for malignant neoplasm of breast: Secondary | ICD-10-CM

## 2016-11-23 ENCOUNTER — Other Ambulatory Visit: Payer: Self-pay | Admitting: Family Medicine

## 2016-11-23 ENCOUNTER — Ambulatory Visit
Admission: RE | Admit: 2016-11-23 | Discharge: 2016-11-23 | Disposition: A | Discharge status: Home / Self Care | Payer: Medicare Other | Source: Ambulatory Visit | Attending: Family Medicine | Admitting: Family Medicine

## 2016-11-23 DIAGNOSIS — Z1231 Encounter for screening mammogram for malignant neoplasm of breast: Secondary | ICD-10-CM | POA: Insufficient documentation

## 2016-11-23 HISTORY — DX: Malignant neoplasm of unspecified site of unspecified female breast: C50.919

## 2016-11-23 HISTORY — DX: Personal history of antineoplastic chemotherapy: Z92.21

## 2016-11-27 ENCOUNTER — Encounter
Admission: RE | Admit: 2016-11-27 | Discharge: 2016-11-27 | Disposition: A | Discharge status: Home / Self Care | Payer: Medicare Other | Source: Ambulatory Visit | Attending: Vascular Surgery | Admitting: Vascular Surgery

## 2016-11-27 DIAGNOSIS — Z01818 Encounter for other preprocedural examination: Secondary | ICD-10-CM | POA: Insufficient documentation

## 2016-11-27 DIAGNOSIS — I714 Abdominal aortic aneurysm, without rupture: Secondary | ICD-10-CM | POA: Insufficient documentation

## 2016-11-27 DIAGNOSIS — Z0183 Encounter for blood typing: Secondary | ICD-10-CM | POA: Diagnosis not present

## 2016-11-27 DIAGNOSIS — I498 Other specified cardiac arrhythmias: Secondary | ICD-10-CM | POA: Insufficient documentation

## 2016-11-27 DIAGNOSIS — Z01812 Encounter for preprocedural laboratory examination: Secondary | ICD-10-CM | POA: Insufficient documentation

## 2016-11-27 LAB — CBC WITH DIFFERENTIAL/PLATELET
BASOS ABS: 0 10*3/uL (ref 0–0.1)
Basophils Relative: 1 %
EOS PCT: 1 %
Eosinophils Absolute: 0.1 10*3/uL (ref 0–0.7)
HCT: 40.4 % (ref 35.0–47.0)
Hemoglobin: 13.2 g/dL (ref 12.0–16.0)
LYMPHS PCT: 29 %
Lymphs Abs: 2.3 10*3/uL (ref 1.0–3.6)
MCH: 28.9 pg (ref 26.0–34.0)
MCHC: 32.7 g/dL (ref 32.0–36.0)
MCV: 88.2 fL (ref 80.0–100.0)
Monocytes Absolute: 0.4 10*3/uL (ref 0.2–0.9)
Monocytes Relative: 6 %
Neutro Abs: 5.2 10*3/uL (ref 1.4–6.5)
Neutrophils Relative %: 65 %
PLATELETS: 275 10*3/uL (ref 150–440)
RBC: 4.58 MIL/uL (ref 3.80–5.20)
RDW: 15.2 % — ABNORMAL HIGH (ref 11.5–14.5)
WBC: 8 10*3/uL (ref 3.6–11.0)

## 2016-11-27 LAB — SURGICAL PCR SCREEN
MRSA, PCR: NEGATIVE
Staphylococcus aureus: NEGATIVE

## 2016-11-27 LAB — BASIC METABOLIC PANEL
ANION GAP: 7 (ref 5–15)
BUN: 17 mg/dL (ref 6–20)
CO2: 27 mmol/L (ref 22–32)
Calcium: 9.4 mg/dL (ref 8.9–10.3)
Chloride: 107 mmol/L (ref 101–111)
Creatinine, Ser: 0.82 mg/dL (ref 0.44–1.00)
GFR calc Af Amer: >60 mL/min (ref >60)
Glucose, Bld: 103 mg/dL — ABNORMAL HIGH (ref 65–99)
POTASSIUM: 4.9 mmol/L (ref 3.5–5.1)
Sodium: 141 mmol/L (ref 135–145)

## 2016-11-27 LAB — TYPE AND SCREEN
ABO/RH(D): A NEG
ANTIBODY SCREEN: NEGATIVE

## 2016-11-27 LAB — PROTIME-INR
INR: 0.95
Prothrombin Time: 12.7 seconds (ref 11.4–15.2)

## 2016-11-27 LAB — APTT: APTT: 28 s (ref 24–36)

## 2016-11-27 NOTE — Patient Instructions (Signed)
Your procedure is scheduled on: 12/06/16 Report to [facility]. 2ND FLOOR MEDICAL MALL ENTRANCE. To find out your arrival time please call [phone] between 1PM - 3PM on 12/05/16.  Remember: Instructions that are not followed completely may result in serious medical risk, up to and including death, or upon the discretion of your surgeon and anesthesiologist your surgery may need to be rescheduled.    __X__ 1. Do not eat food or drink liquids after midnight. No gum chewing or hard candies.     __X__ 2. No Alcohol for 24 hours before or after surgery.   ____ 3. Bring all medications with you on the day of surgery if instructed.    __X__ 4. Notify your doctor if there is any change in your medical condition     (cold, fever, infections).             ___X__5. No smoking within 24 hours of your surgery.     Do not wear jewelry, make-up, hairpins, clips or nail polish.  Do not wear lotions, powders, or perfumes.   Do not shave 48 hours prior to surgery. Men may shave face and neck.  Do not bring valuables to the hospital.    [facility] is not responsible for any belongings or valuables.               Contacts, dentures or bridgework may not be worn into surgery.  Leave your suitcase in the car. After surgery it may be brought to your room.  For patients admitted to the hospital, discharge time is determined by your                treatment team.   Patients discharged the day of surgery will not be allowed to drive home.   Please read over the following fact sheets that you were given:   MRSA Information   ____ Take these medicines the morning of surgery with A SIP OF WATER:    1. NONE  2.   3.   4.  5.  6.  ____ Fleet Enema (as directed)   __X__ Use CHG Soap as directed  __X__ Use inhalers on the day of surgery  ____ Stop metformin 2 days prior to surgery    ____ Take 1/2 of usual insulin dose the night before surgery and none on the morning of surgery.   __X_ Stop  Coumadin/Plavix/aspirin on OK TO CONTINUE ASPIRIN BUT DO NOT TAKE DAY OF SURGERY  __X__ Stop Anti-inflammatories such as Advil, Aleve, Ibuprofen, Motrin, Naproxen, Naprosyn, Goodies,powder, or aspirin products.  OK to take Tylenol.   ____ Stop supplements until after surgery.    ____ Bring C-Pap to the hospital.

## 2016-12-05 MED ORDER — CEFAZOLIN SODIUM-DEXTROSE 2-4 GM/100ML-% IV SOLN
2.0000 g | INTRAVENOUS | Status: AC
  Administered 2016-12-06: 2 g via INTRAVENOUS

## 2016-12-06 ENCOUNTER — Inpatient Hospital Stay: Payer: Medicare Other | Admitting: Anesthesiology

## 2016-12-06 ENCOUNTER — Inpatient Hospital Stay
Admission: RE | Admit: 2016-12-06 | Discharge: 2016-12-07 | DRG: 269 | Disposition: A | Discharge status: Home / Self Care | Payer: Medicare Other | Source: Ambulatory Visit | Attending: Vascular Surgery | Admitting: Vascular Surgery

## 2016-12-06 ENCOUNTER — Encounter
Admission: RE | Disposition: A | Discharge status: Home / Self Care | Payer: Self-pay | Source: Ambulatory Visit | Attending: Vascular Surgery

## 2016-12-06 DIAGNOSIS — Z79899 Other long term (current) drug therapy: Secondary | ICD-10-CM

## 2016-12-06 DIAGNOSIS — E785 Hyperlipidemia, unspecified: Secondary | ICD-10-CM | POA: Diagnosis present

## 2016-12-06 DIAGNOSIS — I2510 Atherosclerotic heart disease of native coronary artery without angina pectoris: Secondary | ICD-10-CM | POA: Diagnosis present

## 2016-12-06 DIAGNOSIS — F17210 Nicotine dependence, cigarettes, uncomplicated: Secondary | ICD-10-CM | POA: Diagnosis present

## 2016-12-06 DIAGNOSIS — Z9221 Personal history of antineoplastic chemotherapy: Secondary | ICD-10-CM | POA: Diagnosis not present

## 2016-12-06 DIAGNOSIS — I714 Abdominal aortic aneurysm, without rupture, unspecified: Secondary | ICD-10-CM

## 2016-12-06 DIAGNOSIS — E7800 Pure hypercholesterolemia, unspecified: Secondary | ICD-10-CM | POA: Diagnosis present

## 2016-12-06 DIAGNOSIS — I701 Atherosclerosis of renal artery: Secondary | ICD-10-CM

## 2016-12-06 DIAGNOSIS — Z853 Personal history of malignant neoplasm of breast: Secondary | ICD-10-CM

## 2016-12-06 DIAGNOSIS — J449 Chronic obstructive pulmonary disease, unspecified: Secondary | ICD-10-CM | POA: Diagnosis present

## 2016-12-06 DIAGNOSIS — I739 Peripheral vascular disease, unspecified: Secondary | ICD-10-CM | POA: Diagnosis present

## 2016-12-06 HISTORY — PX: ENDOVASCULAR STENT GRAFT (AAA): CATH118280

## 2016-12-06 HISTORY — PX: ENDOVASCULAR REPAIR/STENT GRAFT: CATH118280

## 2016-12-06 LAB — GLUCOSE, CAPILLARY: GLUCOSE-CAPILLARY: 162 mg/dL — AB (ref 65–99)

## 2016-12-06 LAB — ABO/RH: ABO/RH(D): A NEG

## 2016-12-06 SURGERY — ENDOVASCULAR REPAIR/STENT GRAFT
Anesthesia: General

## 2016-12-06 MED ORDER — SUGAMMADEX SODIUM 200 MG/2ML IV SOLN
INTRAVENOUS | Status: AC
  Filled 2016-12-06: qty 2

## 2016-12-06 MED ORDER — ALUM & MAG HYDROXIDE-SIMETH 200-200-20 MG/5ML PO SUSP
15.0000 mL | ORAL | Status: DC | PRN
  Filled 2016-12-06: qty 30

## 2016-12-06 MED ORDER — FENTANYL CITRATE (PF) 100 MCG/2ML IJ SOLN
INTRAMUSCULAR | Status: AC
  Filled 2016-12-06: qty 2

## 2016-12-06 MED ORDER — GUAIFENESIN-DM 100-10 MG/5ML PO SYRP
15.0000 mL | ORAL_SOLUTION | ORAL | Status: DC | PRN
  Filled 2016-12-06: qty 15

## 2016-12-06 MED ORDER — LACTATED RINGERS IV SOLN
INTRAVENOUS | Status: DC
  Administered 2016-12-06: 07:00:00 via INTRAVENOUS

## 2016-12-06 MED ORDER — NITROGLYCERIN IN D5W 200-5 MCG/ML-% IV SOLN: 5.0000 ug/min | INTRAVENOUS | Status: DC

## 2016-12-06 MED ORDER — HEPARIN SODIUM (PORCINE) 1000 UNIT/ML IJ SOLN
INTRAMUSCULAR | Status: DC | PRN
  Administered 2016-12-06: 5000 [IU] via INTRAVENOUS

## 2016-12-06 MED ORDER — SODIUM CHLORIDE 0.9 % IV SOLN: 500.0000 mL | Freq: Once | INTRAVENOUS | Status: DC | PRN

## 2016-12-06 MED ORDER — FAMOTIDINE 20 MG PO TABS: 20.0000 mg | ORAL_TABLET | Freq: Once | ORAL | Status: DC

## 2016-12-06 MED ORDER — OXYCODONE HCL 5 MG PO TABS
5.0000 mg | ORAL_TABLET | Freq: Once | ORAL | Status: DC | PRN
Start: 2016-12-06 — End: 2016-12-06

## 2016-12-06 MED ORDER — LIDOCAINE HCL (CARDIAC) 20 MG/ML IV SOLN
INTRAVENOUS | Status: DC | PRN
  Administered 2016-12-06: 50 mg via INTRAVENOUS

## 2016-12-06 MED ORDER — MAGNESIUM SULFATE 2 GM/50ML IV SOLN: 2.0000 g | Freq: Every day | INTRAVENOUS | Status: DC | PRN

## 2016-12-06 MED ORDER — PROPOFOL 10 MG/ML IV BOLUS
INTRAVENOUS | Status: AC
  Filled 2016-12-06: qty 20

## 2016-12-06 MED ORDER — CHLORHEXIDINE GLUCONATE CLOTH 2 % EX PADS
6.0000 | MEDICATED_PAD | Freq: Once | CUTANEOUS | Status: AC
  Administered 2016-12-06: 6 via TOPICAL

## 2016-12-06 MED ORDER — ONDANSETRON HCL 4 MG/2ML IJ SOLN
INTRAMUSCULAR | Status: DC | PRN
  Administered 2016-12-06: 4 mg via INTRAVENOUS

## 2016-12-06 MED ORDER — OXYCODONE-ACETAMINOPHEN 5-325 MG PO TABS: 1.0000 | ORAL_TABLET | ORAL | Status: DC | PRN

## 2016-12-06 MED ORDER — FAMOTIDINE IN NACL 20-0.9 MG/50ML-% IV SOLN
20.0000 mg | Freq: Two times a day (BID) | INTRAVENOUS | Status: DC
  Administered 2016-12-06 – 2016-12-07 (×3): 20 mg via INTRAVENOUS
  Filled 2016-12-06 (×3): qty 50

## 2016-12-06 MED ORDER — GLYCOPYRROLATE 0.2 MG/ML IJ SOLN
INTRAMUSCULAR | Status: AC
  Filled 2016-12-06: qty 1

## 2016-12-06 MED ORDER — POTASSIUM CHLORIDE CRYS ER 20 MEQ PO TBCR: 20.0000 meq | EXTENDED_RELEASE_TABLET | Freq: Every day | ORAL | Status: DC | PRN

## 2016-12-06 MED ORDER — CEFUROXIME SODIUM 1.5 G IV SOLR
1.5000 g | Freq: Two times a day (BID) | INTRAVENOUS | Status: AC
  Administered 2016-12-06 (×2): 1.5 g via INTRAVENOUS
  Filled 2016-12-06 (×2): qty 1.5

## 2016-12-06 MED ORDER — MORPHINE SULFATE (PF) 2 MG/ML IV SOLN: 2.0000 mg | INTRAVENOUS | Status: DC | PRN

## 2016-12-06 MED ORDER — PHENYLEPHRINE HCL 10 MG/ML IJ SOLN
INTRAMUSCULAR | Status: AC
  Filled 2016-12-06: qty 1

## 2016-12-06 MED ORDER — ONDANSETRON HCL 4 MG/2ML IJ SOLN
INTRAMUSCULAR | Status: AC
  Filled 2016-12-06: qty 2

## 2016-12-06 MED ORDER — PHENYLEPHRINE HCL 10 MG/ML IJ SOLN
INTRAMUSCULAR | Status: DC | PRN
  Administered 2016-12-06 (×4): 100 ug via INTRAVENOUS

## 2016-12-06 MED ORDER — SODIUM CHLORIDE 0.9 % IV SOLN
INTRAVENOUS | Status: DC | PRN
  Administered 2016-12-06: 25 ug/min via INTRAVENOUS

## 2016-12-06 MED ORDER — CHLORHEXIDINE GLUCONATE CLOTH 2 % EX PADS
6.0000 | MEDICATED_PAD | Freq: Once | CUTANEOUS | Status: AC
  Administered 2016-12-05: 6 via TOPICAL

## 2016-12-06 MED ORDER — DEXAMETHASONE SODIUM PHOSPHATE 10 MG/ML IJ SOLN
INTRAMUSCULAR | Status: DC | PRN
  Administered 2016-12-06: 5 mg via INTRAVENOUS

## 2016-12-06 MED ORDER — GLYCOPYRROLATE 0.2 MG/ML IJ SOLN
INTRAMUSCULAR | Status: DC | PRN
  Administered 2016-12-06: 0.2 mg via INTRAVENOUS

## 2016-12-06 MED ORDER — DEXAMETHASONE SODIUM PHOSPHATE 10 MG/ML IJ SOLN
INTRAMUSCULAR | Status: AC
  Filled 2016-12-06: qty 1

## 2016-12-06 MED ORDER — METOPROLOL TARTRATE 5 MG/5ML IV SOLN: 2.0000 mg | INTRAVENOUS | Status: DC | PRN

## 2016-12-06 MED ORDER — LIDOCAINE HCL (PF) 2 % IJ SOLN
INTRAMUSCULAR | Status: AC
  Filled 2016-12-06: qty 2

## 2016-12-06 MED ORDER — ACETAMINOPHEN 325 MG PO TABS
325.0000 mg | ORAL_TABLET | ORAL | Status: DC | PRN
  Administered 2016-12-06 – 2016-12-07 (×2): 650 mg via ORAL
  Filled 2016-12-06 (×2): qty 2

## 2016-12-06 MED ORDER — EPHEDRINE SULFATE 50 MG/ML IJ SOLN
INTRAMUSCULAR | Status: AC
  Filled 2016-12-06: qty 1

## 2016-12-06 MED ORDER — PHENOL 1.4 % MT LIQD
1.0000 | OROMUCOSAL | Status: DC | PRN
  Filled 2016-12-06: qty 177

## 2016-12-06 MED ORDER — ROCURONIUM BROMIDE 50 MG/5ML IV SOLN
INTRAVENOUS | Status: AC
  Filled 2016-12-06: qty 1

## 2016-12-06 MED ORDER — ROCURONIUM BROMIDE 100 MG/10ML IV SOLN
INTRAVENOUS | Status: DC | PRN
  Administered 2016-12-06: 40 mg via INTRAVENOUS
  Administered 2016-12-06: 10 mg via INTRAVENOUS

## 2016-12-06 MED ORDER — SODIUM CHLORIDE 0.9 % IV SOLN
INTRAVENOUS | Status: DC
  Administered 2016-12-06: 1000 mL via INTRAVENOUS

## 2016-12-06 MED ORDER — ALBUTEROL SULFATE HFA 108 (90 BASE) MCG/ACT IN AERS: 2.0000 | INHALATION_SPRAY | Freq: Four times a day (QID) | RESPIRATORY_TRACT | Status: DC | PRN

## 2016-12-06 MED ORDER — DOPAMINE-DEXTROSE 3.2-5 MG/ML-% IV SOLN: 3.0000 ug/kg/min | INTRAVENOUS | Status: DC

## 2016-12-06 MED ORDER — SUGAMMADEX SODIUM 200 MG/2ML IV SOLN
INTRAVENOUS | Status: DC | PRN
  Administered 2016-12-06: 130 mg via INTRAVENOUS

## 2016-12-06 MED ORDER — DOCUSATE SODIUM 100 MG PO CAPS
100.0000 mg | ORAL_CAPSULE | Freq: Every day | ORAL | Status: DC
  Administered 2016-12-07: 100 mg via ORAL
  Filled 2016-12-06: qty 1

## 2016-12-06 MED ORDER — HEPARIN SODIUM (PORCINE) 1000 UNIT/ML IJ SOLN
INTRAMUSCULAR | Status: AC
  Filled 2016-12-06: qty 1

## 2016-12-06 MED ORDER — IPRATROPIUM-ALBUTEROL 0.5-2.5 (3) MG/3ML IN SOLN
RESPIRATORY_TRACT | Status: AC
  Administered 2016-12-06: 3 mL via RESPIRATORY_TRACT
  Filled 2016-12-06: qty 3

## 2016-12-06 MED ORDER — MIDAZOLAM HCL 2 MG/2ML IJ SOLN
INTRAMUSCULAR | Status: DC | PRN
Start: 2016-12-06 — End: 2016-12-06
  Administered 2016-12-06: 2 mg via INTRAVENOUS

## 2016-12-06 MED ORDER — ALBUTEROL SULFATE (2.5 MG/3ML) 0.083% IN NEBU: 2.5000 mg | INHALATION_SOLUTION | RESPIRATORY_TRACT | Status: DC | PRN

## 2016-12-06 MED ORDER — ACETAMINOPHEN 650 MG RE SUPP: 325.0000 mg | RECTAL | Status: DC | PRN

## 2016-12-06 MED ORDER — IPRATROPIUM-ALBUTEROL 0.5-2.5 (3) MG/3ML IN SOLN
3.0000 mL | Freq: Once | RESPIRATORY_TRACT | Status: AC
  Administered 2016-12-06: 3 mL via RESPIRATORY_TRACT

## 2016-12-06 MED ORDER — IOPAMIDOL (ISOVUE-300) INJECTION 61%
INTRAVENOUS | Status: DC | PRN
  Administered 2016-12-06: 70 mL via INTRAVENOUS

## 2016-12-06 MED ORDER — ONDANSETRON HCL 4 MG/2ML IJ SOLN: 4.0000 mg | Freq: Four times a day (QID) | INTRAMUSCULAR | Status: DC | PRN

## 2016-12-06 MED ORDER — FENTANYL CITRATE (PF) 100 MCG/2ML IJ SOLN
INTRAMUSCULAR | Status: DC | PRN
  Administered 2016-12-06 (×2): 50 ug via INTRAVENOUS

## 2016-12-06 MED ORDER — SODIUM CHLORIDE 0.9 % IV SOLN
INTRAVENOUS | Status: DC | PRN
Start: 2016-12-06 — End: 2016-12-06
  Administered 2016-12-06: 08:00:00 via INTRAVENOUS

## 2016-12-06 MED ORDER — EPHEDRINE SULFATE 50 MG/ML IJ SOLN
INTRAMUSCULAR | Status: DC | PRN
  Administered 2016-12-06 (×2): 10 mg via INTRAVENOUS
  Administered 2016-12-06 (×2): 5 mg via INTRAVENOUS

## 2016-12-06 MED ORDER — PROPOFOL 10 MG/ML IV BOLUS
INTRAVENOUS | Status: DC | PRN
  Administered 2016-12-06: 150 mg via INTRAVENOUS

## 2016-12-06 MED ORDER — OXYCODONE HCL 5 MG/5ML PO SOLN: 5.0000 mg | Freq: Once | ORAL | Status: DC | PRN

## 2016-12-06 MED ORDER — ASPIRIN EC 81 MG PO TBEC
81.0000 mg | DELAYED_RELEASE_TABLET | Freq: Every day | ORAL | Status: DC
  Administered 2016-12-06 – 2016-12-07 (×2): 81 mg via ORAL
  Filled 2016-12-06 (×2): qty 1

## 2016-12-06 MED ORDER — HYDRALAZINE HCL 20 MG/ML IJ SOLN: 5.0000 mg | INTRAMUSCULAR | Status: DC | PRN

## 2016-12-06 MED ORDER — MOMETASONE FURO-FORMOTEROL FUM 200-5 MCG/ACT IN AERO
2.0000 | INHALATION_SPRAY | Freq: Two times a day (BID) | RESPIRATORY_TRACT | Status: DC
  Administered 2016-12-06 – 2016-12-07 (×3): 2 via RESPIRATORY_TRACT
  Filled 2016-12-06: qty 8.8

## 2016-12-06 MED ORDER — LABETALOL HCL 5 MG/ML IV SOLN: 10.0000 mg | INTRAVENOUS | Status: DC | PRN

## 2016-12-06 MED ORDER — FENTANYL CITRATE (PF) 100 MCG/2ML IJ SOLN: 25.0000 ug | INTRAMUSCULAR | Status: DC | PRN

## 2016-12-06 MED ORDER — MIDAZOLAM HCL 2 MG/2ML IJ SOLN
INTRAMUSCULAR | Status: AC
Start: 2016-12-06 — End: 2016-12-06
  Filled 2016-12-06: qty 2

## 2016-12-06 MED ORDER — ATORVASTATIN CALCIUM 10 MG PO TABS
10.0000 mg | ORAL_TABLET | Freq: Every day | ORAL | Status: DC
  Administered 2016-12-06: 10 mg via ORAL
  Filled 2016-12-06: qty 1

## 2016-12-06 SURGICAL SUPPLY — 52 items
BLADE SURG 15 STRL LF DISP TIS (BLADE) ×1 IMPLANT
BLADE SURG 15 STRL SS (BLADE) ×2
BLADE SURG SZ11 CARB STEEL (BLADE) ×3 IMPLANT
CATH ACCU-VU SIZ PIG 5F 70CM (CATHETERS) ×3 IMPLANT
CATH ANGIO 5F 80CM MHK1-H (CATHETERS) ×3 IMPLANT
CATH BALLN CODA 9X100X32 (BALLOONS) ×3 IMPLANT
CATH BEACON 5 .035 65 KMP TIP (CATHETERS) ×3 IMPLANT
CATH LIMA 6F (CATHETERS) ×3 IMPLANT
DERMABOND ADVANCED (GAUZE/BANDAGES/DRESSINGS) ×2
DERMABOND ADVANCED .7 DNX12 (GAUZE/BANDAGES/DRESSINGS) ×1 IMPLANT
DEVICE CLOSURE PERCLS PRGLD 6F (VASCULAR PRODUCTS) ×6 IMPLANT
DEVICE PRESTO INFLATION (MISCELLANEOUS) ×6 IMPLANT
DEVICE SAFEGUARD 24CM (GAUZE/BANDAGES/DRESSINGS) ×6 IMPLANT
DEVICE TORQUE (MISCELLANEOUS) ×6 IMPLANT
DRYSEAL FLEXSHEATH 12FR 33CM (SHEATH) ×2
DRYSEAL FLEXSHEATH 16FR 33CM (SHEATH) ×4
ELECT CAUTERY BLADE 6.4 (BLADE) ×3 IMPLANT
ELECT REM PT RETURN 9FT ADLT (ELECTROSURGICAL) ×3
ELECTRODE REM PT RTRN 9FT ADLT (ELECTROSURGICAL) ×1 IMPLANT
EXCLUDER TNK LEG 26MX12X16 (Endovascular Graft) ×1 IMPLANT
EXCLUDER TRUNK LEG 26MX12X16 (Endovascular Graft) ×3 IMPLANT
GLIDEWIRE STIFF .35X180X3 HYDR (WIRE) ×3 IMPLANT
GLOVE BIO SURGEON STRL SZ7 (GLOVE) ×9 IMPLANT
GLOVE BIO SURGEON STRL SZ8 (GLOVE) ×3 IMPLANT
GOWN STRL REUS W/ TWL LRG LVL3 (GOWN DISPOSABLE) ×2 IMPLANT
GOWN STRL REUS W/ TWL XL LVL3 (GOWN DISPOSABLE) ×2 IMPLANT
GOWN STRL REUS W/TWL LRG LVL3 (GOWN DISPOSABLE) ×4
GOWN STRL REUS W/TWL XL LVL3 (GOWN DISPOSABLE) ×4
GRAFT EXCLUDER AORTIC 26X3.3CM (Endovascular Graft) ×3 IMPLANT
GRAFT EXCLUDER LEG 12X14 (Endovascular Graft) ×3 IMPLANT
IV NS 1000ML (IV SOLUTION) ×2
IV NS 1000ML BAXH (IV SOLUTION) ×1 IMPLANT
NEEDLE ENTRY 21GA 7CM ECHOTIP (NEEDLE) ×3 IMPLANT
PACK ANGIOGRAPHY (CUSTOM PROCEDURE TRAY) ×3 IMPLANT
PACK BASIN MAJOR ARMC (MISCELLANEOUS) ×3 IMPLANT
PERCLOSE PROGLIDE 6F (VASCULAR PRODUCTS) ×18
SET INTRO CAPELLA COAXIAL (SET/KITS/TRAYS/PACK) ×3 IMPLANT
SHEATH BRITE TIP 6FRX11 (SHEATH) ×6 IMPLANT
SHEATH BRITE TIP 8FRX11 (SHEATH) ×6 IMPLANT
SHEATH DRYSEAL FLEX 12FR 33CM (SHEATH) ×1 IMPLANT
SHEATH DRYSEAL FLEX 16FR 33CM (SHEATH) ×2 IMPLANT
SHIELD RADPAD SCOOP 12X17 (MISCELLANEOUS) ×6 IMPLANT
STENT HERCULINK RX 5.5X18X135 (Permanent Stent) ×3 IMPLANT
SUT MNCRL 4-0 (SUTURE) ×2
SUT MNCRL 4-0 27XMFL (SUTURE) ×1
SUT PROLENE 6 0 BV (SUTURE) ×3 IMPLANT
SUTURE MNCRL 4-0 27XMF (SUTURE) ×1 IMPLANT
VALVE HEMO TOUHY BORST Y (VALVE) ×3 IMPLANT
WIRE AMPLATZ SSTIFF .035X260CM (WIRE) ×6 IMPLANT
WIRE J 3MM .035X145CM (WIRE) ×6 IMPLANT
WIRE MAGIC TOR.035 180C (WIRE) ×3 IMPLANT
WIRE SPARTACORE .014X190CM (WIRE) ×3 IMPLANT

## 2016-12-06 NOTE — Anesthesia Preprocedure Evaluation (Addendum)
Anesthesia Evaluation  Patient identified by MRN, date of birth, ID band Patient awake    Reviewed: Allergy & Precautions, H&P , NPO status , Patient's Chart, lab work & pertinent test results  Airway Mallampati: III  TM Distance: >3 FB Neck ROM: limited    Dental  (+) Poor Dentition, Chipped, Missing, Edentulous Upper   Pulmonary COPD,  COPD inhaler, Current Smoker,    + rhonchi  + decreased breath sounds      Cardiovascular Exercise Tolerance: Good (-) angina+ Peripheral Vascular Disease  (-) Past MI and (-) PND negative cardio ROS Normal cardiovascular exam Rhythm:regular Rate:Normal     Neuro/Psych negative neurological ROS  negative psych ROS   GI/Hepatic negative GI ROS, Neg liver ROS, neg GERD  ,  Endo/Other  negative endocrine ROS  Renal/GU      Musculoskeletal   Abdominal   Peds  Hematology negative hematology ROS (+)   Anesthesia Other Findings Patient reports baseline cough  Past Medical History: 2002: Breast cancer ([place])     Comment: left 2002: Cancer ([place])     Comment: left breast cancer No date: COPD (chronic obstructive pulmonary disease) (* No date: High cholesterol No date: Personal history of chemotherapy  Past Surgical History: No date: ABDOMINAL HYSTERECTOMY 2002: MASTECTOMY Left  BMI    Body Mass Index:  22.24 kg/m      Reproductive/Obstetrics negative OB ROS                            Anesthesia Physical Anesthesia Plan  ASA: III  Anesthesia Plan: General ETT   Post-op Pain Management:    Induction: Intravenous  PONV Risk Score and Plan: 3 and Ondansetron, Dexamethasone, Propofol and Treatment may vary due to age or medical condition  Airway Management Planned: Oral ETT  Additional Equipment:   Intra-op Plan:   Post-operative Plan: Extubation in OR and Possible Post-op intubation/ventilation  Informed Consent: I have reviewed the  patients History and Physical, chart, labs and discussed the procedure including the risks, benefits and alternatives for the proposed anesthesia with the patient or authorized representative who has indicated his/her understanding and acceptance.   Dental Advisory Given  Plan Discussed with: Anesthesiologist, CRNA and Surgeon  Anesthesia Plan Comments: (Patient consented for risks of anesthesia including but not limited to:  - adverse reactions to medications - damage to teeth, lips or other oral mucosa - sore throat or hoarseness - Damage to heart, brain, lungs or loss of life  Patient voiced understanding.)        Anesthesia Quick Evaluation

## 2016-12-06 NOTE — Anesthesia Postprocedure Evaluation (Signed)
Anesthesia Post Note  Patient: Roberta [name]  Procedure(s) Performed: Procedure(s) (LRB): Endovascular Repair/Stent Graft (N/A)  Patient location during evaluation: PACU Anesthesia Type: General Level of consciousness: awake and alert Pain management: pain level controlled Vital Signs Assessment: post-procedure vital signs reviewed and stable Respiratory status: spontaneous breathing, nonlabored ventilation, respiratory function stable and patient connected to nasal cannula oxygen Cardiovascular status: blood pressure returned to baseline and stable Postop Assessment: no signs of nausea or vomiting Anesthetic complications: no     Last Vitals:  Vitals:   12/06/16 1105 12/06/16 1115  BP: (!) 155/70   Pulse: 70 65  Resp: 19 17  Temp:      Last Pain:  Vitals:   12/06/16 0625  TempSrc: Oral                 Precious Haws Martina Brodbeck

## 2016-12-06 NOTE — Anesthesia Post-op Follow-up Note (Cosign Needed)
Anesthesia QCDR form completed.        

## 2016-12-06 NOTE — Op Note (Signed)
OPERATIVE NOTE   PROCEDURE: 1. US guidance for vascular access, bilateral femoral arteries 2. Catheter placement into aorta from bilateral femoral approaches 3. Placement of a C3 26 x 12 x 16 Gore Excluder Endoprosthesis main body with a 12 x 14 contralateral limb 4. Placement of 2 26 x 30 main body extender cuffs 5. Introduction catheter into right renal artery 6. Placement of a 5.5 mm x 18 mm Herculink stent right renal artery 7. ProGlide closure devices bilateral femoral arteries  PRE-OPERATIVE DIAGNOSIS: AAA  POST-OPERATIVE DIAGNOSIS: same  SURGEON: Hortencia Pilar, MD and Leotis Pain, MD - Co-surgeons  ANESTHESIA: general  ESTIMATED BLOOD LOSS: 100 cc  FINDING(S): 1.  AAA  SPECIMEN(S):  none  INDICATIONS:   Roberta [name] is a 71 y.o. y.o. female who presents with a 5 cm abdominal aortic aneurysm.  The risks and benefits for endovascular repair were reviewed all questions answered patient has agreed to proceed with repair of her abdominal aortic aneurysm to prevent lethal rupture.  DESCRIPTION: After obtaining full informed written consent, the patient was brought back to the operating room and placed supine upon the operating table.  The patient received IV antibiotics prior to induction.  After obtaining adequate anesthesia, the patient was prepped and draped in the standard fashion for endovascular AAA repair.  We then began by gaining access to both femoral arteries with US guidance with me working on the left and Dr. Lucky Cowboy working on the right.  The femoral arteries were found to be patent and accessed without difficulty with a needle under ultrasound guidance without difficulty on each side and permanent images were recorded.  We then placed 2 proglide devices on each side in a pre-close fashion and placed 8 French sheaths.  The patient was then given  5000 units of intravenous heparin.   The Pigtail catheter was placed into the aorta from the  left side. Using this  image, we selected a 26 x 12 x 16 Main body device.  Over a stiff wire, an 15 French sheath was placed. The main body was then placed through the 18 French sheath. A Kumpe catheter was placed up the right side and a magnified image at the renal arteries was performed. The main body was then deployed just below the lowest renal artery. The Kumpe catheter was used to cannulate the contralateral gate without difficulty and successful cannulation was confirmed by twirling the pigtail catheter in the main body. We then placed a stiff wire and a retrograde arteriogram was performed through the right femoral sheath. We upsized to the 12 [place] sheath for the contralateral limb and a 12 x 14 limb was selected and deployed. The main body deployment was then completed. Based off the angiographic findings, extension limbs in the iliac arteries were not necessary.  However, type I endoleak was identified which appeared to be from the leading edge of the main body. Therefore a 26 x 30 extender cuff was advanced up the left side and deployed. This deployment did not yield significant extension and the leak persisted. Therefore stiff wire was advanced up the right side and the sheath was increased in size to 16 [place] sheath and a second 26 x 30 extender cuff was then placed extending it slightly into or above the right renal. All junction points and seals zones were treated with the compliant balloon.   The pigtail catheter was then replaced and a completion angiogram was performed.   No Endoleak was detected on this  angiography. The renal arteries were found to be widely patent on the left but a 50% obstruction on the right. Therefore a stiff angled Glidewire and the S1 catheter were backloaded onto a LIMA 6 [place] guiding sheath.  This system was then advanced up the left side the the S1 reformed and used to engage the ostia of the right renal artery hand injection contrast demonstrated the renal anatomy confirming a  significant obstruction and I elected to move forward with stenting. A Magic torque wire was introduced and with the Magic torque wire in place the LIMA guiding catheter was advanced over the V S1 and positioned within the renal artery proper. After appropriate measurements a 5.5 x 18 Herculink stent was selected and then deployed protruding into the aorta by approximately 3 mm and securing the ostia of the right renal artery.   At this point we elected to terminate the procedure. We secured the pro glide devices for hemostasis on the femoral arteries. The skin incision was closed with a 4-0 Monocryl. Dermabond and pressure dressing were placed. The patient was taken to the recovery room in stable condition having tolerated the procedure well.  COMPLICATIONS: none  CONDITION: stable  Hortencia Pilar  12/06/2016, 11:17 AM

## 2016-12-06 NOTE — Op Note (Signed)
OPERATIVE NOTE   PROCEDURE: 1. US guidance for vascular access, bilateral femoral arteries 2. Catheter placement into aorta from bilateral femoral approaches 3. Placement of a 26 mm diameter, 16 cm length Gore Excluder Endoprosthesis main body left with a 12 mm diameter 14 cm length contralateral limb 4. Placement of two 26 mm diameter aortic cuffs 5. Placement of a right renal artery stent with 5.5 mm diameter x 18 mm length 6. ProGlide closure devices bilateral femoral arteries  PRE-OPERATIVE DIAGNOSIS: AAA  POST-OPERATIVE DIAGNOSIS: same  SURGEON: Leotis Pain, MD and Hortencia Pilar, MD - Co-surgeons  ANESTHESIA: general  ESTIMATED BLOOD LOSS: 75 cc  FINDING(S): 1.  AAA  SPECIMEN(S):  none  INDICATIONS:   Roberta [name] is a 71 y.o. female who presents with 5 cm AAA. The anatomy was suitable for endovascular repair.  Risks and benefits of repair in an endovascular fashion were discussed and informed consent was obtained.  DESCRIPTION: After obtaining full informed written consent, the patient was brought back to the operating room and placed supine upon the operating table.  The patient received IV antibiotics prior to induction.  After obtaining adequate anesthesia, the patient was prepped and draped in the standard fashion for endovascular AAA repair.  We then began by gaining access to both femoral arteries with US guidance with me working on the right and Dr. Delana Meyer working on the left.  The femoral arteries were found to be patent and accessed without difficulty with a needle under ultrasound guidance without difficulty on each side and permanent images were recorded.  We then placed 2 proglide devices on each side in a pre-close fashion and placed 8 French sheaths. The patient was then given 5000 units of intravenous heparin. The Pigtail catheter was placed into the aorta from the left side. Using this image, we selected a 26 mm x 16 cm length Main body device.  Over a  stiff wire, an 16 French sheath was placed up the left side. The main body was then placed through the 16 French sheath on the left. A Kumpe catheter was placed up the right side and a magnified image at the renal arteries was performed. The main body was then deployed just below the lowest renal artery which was the right renal artery. The Kumpe catheter was used to cannulate the contralateral gate without difficulty and successful cannulation was confirmed by twirling the pigtail catheter in the main body. We then placed a stiff wire and a retrograde arteriogram was performed through the right femoral sheath. We upsized to the 12 [place] sheath on the right for the contralateral limb and a 12 mm diameter x 14 cm length limb was selected and deployed. The main body deployment was then completed.  Based off the angiographic findings, extension limbs were not necessary in the iliac arteries. All junction points and seals zones were treated with the compliant balloon. The pigtail catheter was then replaced and a completion angiogram was performed. A type IA Endoleak was detected on completion angiography. The configuration was such that the stent was only about 2 mm below the right renal artery but about 10 mm below the left renal artery. We then deployed a 26 mm diameter aortic cuff proximally to try to gain length but only gained about 2-3 mm on each side initially. After using the compliant balloon and a type I endoleak was still present. For configuration purposes, we upsized to a 16 French sheath on the right to be able to  get the aortic cuff higher on the left side. Through the right femoral sheath a 26 mm diameter aortic cuff was placed and this was placed to the base of the left renal artery only about 1-2 mm below it and now covered about 1-2 mm of the right renal artery creating some narrowing. After this was ironed out with the compliant balloon, imaging was performed which showed resolution of the type I  endoleak with no endoleak present at this point. We then elected to stent the right renal artery to ensure flow to the right kidney. Dr. Delana Meyer selectively cannulated the right renal artery without difficulty with a VS 1 catheter and advanced an IM guide catheter into the right renal artery over a Magic torque wire. Then exchanged for a 0.014 wire and deployed a 5.5 mm diameter by 18 mm length Herculink stent in the proximal right renal artery going back about 2 mm into the aorta. Completion angiogram was then performed. The renal arteries were found to be widely patent with the stent in the right renal artery. At this point we elected to terminate the procedure. We secured the pro glide devices for hemostasis on the femoral arteries. The skin incision was closed with a 4-0 Monocryl. Dermabond and pressure dressing were placed. The patient was taken to the recovery room in stable condition having tolerated the procedure well.  COMPLICATIONS: none  CONDITION: stable  Leotis Pain  12/06/2016, 9:48 AM   This note was created with Dragon Medical transcription system. Any errors in dictation are purely unintentional.

## 2016-12-06 NOTE — H&P (Signed)
 [facility] History & Physical Update  The patient was interviewed and re-examined.  The patient's previous History and Physical has been reviewed and is unchanged.  There is no change in the plan of care. We plan to proceed with the scheduled procedure.  Hortencia Pilar, MD  12/06/2016, 7:30 AM

## 2016-12-06 NOTE — Anesthesia Procedure Notes (Signed)
Procedure Name: Intubation Performed by: Rolla Plate Pre-anesthesia Checklist: Patient identified, Patient being monitored, Timeout performed, Emergency Drugs available and Suction available Patient Re-evaluated:Patient Re-evaluated prior to inductionOxygen Delivery Method: Circle system utilized Preoxygenation: Pre-oxygenation with 100% oxygen Intubation Type: IV induction Ventilation: Mask ventilation without difficulty Laryngoscope Size: Miller and 2 Grade View: Grade I Tube type: Oral Tube size: 7.0 mm Number of attempts: 1 Airway Equipment and Method: Stylet Placement Confirmation: ETT inserted through vocal cords under direct vision,  positive ETCO2 and breath sounds checked- equal and bilateral Secured at: 21 cm Tube secured with: Tape Dental Injury: Teeth and Oropharynx as per pre-operative assessment

## 2016-12-06 NOTE — Transfer of Care (Signed)
Immediate Anesthesia Transfer of Care Note  Patient: Roberta [name]  Procedure(s) Performed: Procedure(s): Endovascular Repair/Stent Graft (N/A)  Patient Location: PACU  Anesthesia Type:General  Level of Consciousness: awake  Airway & Oxygen Therapy: Patient Spontanous Breathing and Patient connected to face mask oxygen  Post-op Assessment: Report given to RN and Post -op Vital signs reviewed and stable  Post vital signs: Reviewed  Last Vitals:  Vitals:   12/06/16 0625 12/06/16 1003  BP: 124/61 128/64  Pulse: 68 79  Resp: 18 20  Temp: 36.7 C (!) 36.2 C    Last Pain:  Vitals:   12/06/16 0625  TempSrc: Oral         Complications: No apparent anesthesia complications

## 2016-12-07 LAB — BASIC METABOLIC PANEL
Anion gap: 6 (ref 5–15)
BUN: 10 mg/dL (ref 6–20)
CALCIUM: 8.5 mg/dL — AB (ref 8.9–10.3)
CO2: 25 mmol/L (ref 22–32)
CREATININE: 0.54 mg/dL (ref 0.44–1.00)
Chloride: 110 mmol/L (ref 101–111)
GFR calc non Af Amer: >60 mL/min (ref >60)
GLUCOSE: 105 mg/dL — AB (ref 65–99)
Potassium: 3.9 mmol/L (ref 3.5–5.1)
Sodium: 141 mmol/L (ref 135–145)

## 2016-12-07 LAB — CBC
HEMATOCRIT: 36 % (ref 35.0–47.0)
Hemoglobin: 11.8 g/dL — ABNORMAL LOW (ref 12.0–16.0)
MCH: 29.1 pg (ref 26.0–34.0)
MCHC: 32.7 g/dL (ref 32.0–36.0)
MCV: 89.1 fL (ref 80.0–100.0)
Platelets: 237 10*3/uL (ref 150–440)
RBC: 4.05 MIL/uL (ref 3.80–5.20)
RDW: 14.9 % — AB (ref 11.5–14.5)
WBC: 12.6 10*3/uL — ABNORMAL HIGH (ref 3.6–11.0)

## 2016-12-07 MED ORDER — HYDROCODONE-ACETAMINOPHEN 5-325 MG PO TABS: 1.0000 | ORAL_TABLET | Freq: Four times a day (QID) | ORAL | 0 refills | Status: DC | PRN

## 2016-12-07 NOTE — Progress Notes (Signed)
Discharge instructions reviewed w/ the patient and her daughter who was at the bedside. Both verbalized their understanding of same. Patient signed that she received a written copy of instructions and her prescription for Norco. Patient was taken to visitor's entrance via wheelchair by hospital volunteer.

## 2016-12-07 NOTE — Care Management (Signed)
This RNCM received notification that patient has a referral from Dr. Bunnie Domino office to [facility]. Patient is aware but did not recall name of agency- she wants the one her doctor wants to use. There is no home health order in place by discharging MD. Since this started from MD office- I have advised Joelene Millin with [facility] to reach out to Dr. Lucky Cowboy office to complete this home health process. Patient indicated that she is not homebound- I have notified Joelene Millin. No RNCM needs.

## 2016-12-07 NOTE — Progress Notes (Signed)
Patient ambulated in room and up to recliner chair w/o difficulty. Denied pain, dizziness, SOB. Verbalizes relief of back discomfort while in chair instead of bed.

## 2016-12-21 ENCOUNTER — Encounter (INDEPENDENT_AMBULATORY_CARE_PROVIDER_SITE_OTHER): Payer: Self-pay | Admitting: Vascular Surgery

## 2016-12-21 ENCOUNTER — Ambulatory Visit (INDEPENDENT_AMBULATORY_CARE_PROVIDER_SITE_OTHER): Payer: Medicare Other | Admitting: Vascular Surgery

## 2016-12-21 VITALS — BP 106/73 | HR 70 | Resp 16 | Ht 66.0 in | Wt 139.0 lb

## 2016-12-21 DIAGNOSIS — I714 Abdominal aortic aneurysm, without rupture, unspecified: Secondary | ICD-10-CM

## 2016-12-21 NOTE — Progress Notes (Signed)
Patient ID: Roberta [name], female   DOB: [birth year], 71 y.o.   MRN: [id number]  Chief Complaint  Patient presents with  . Re-evaluation    2 week [facility] follow up, AAA    HPI Roberta [name] is a 71 y.o. female.  The patient returns to the office for surveillance of an abdominal aortic aneurysm status post stent graft placement on:  PROCEDURE November 26, 2016: 1. US guidance for vascular access, bilateral femoral arteries 2. Catheter placement into aorta from bilateral femoral approaches 3. Placement of a C3 26 x 12 x 16 Gore Excluder Endoprosthesis main body with a 12 x 14 contralateral limb 4. Placement of 2 26 x 30 main body extender cuffs 5. Introduction catheter into right renal artery 6. Placement of a 5.5 mm x 18 mm Herculink stent right renal artery 7. ProGlide closure devices bilateral femoral arteries  Patient denies abdominal pain or back pain, no other abdominal complaints. No groin related complaints. No symptoms consistent with distal embolization No changes in claudication distance.   There have been no interval changes in his overall healthcare since she was DC'd  Patient denies amaurosis fugax or TIA symptoms. There is no history of claudication or rest pain symptoms of the lower extremities. The patient denies angina or shortness of breath.     Past Medical History:  Diagnosis Date  . Breast cancer ([place]) 2002   left  . Cancer [facility]) 2002   left breast cancer  . COPD (chronic obstructive pulmonary disease) ([place])   . High cholesterol   . Personal history of chemotherapy     Past Surgical History:  Procedure Laterality Date  . ABDOMINAL HYSTERECTOMY    . ENDOVASCULAR REPAIR/STENT GRAFT N/A 12/06/2016   Procedure: Endovascular Repair/Stent Graft;  Surgeon: Katha Cabal, MD;  Location: [place] [facility];  Service: Cardiovascular;  Laterality: N/A;  . MASTECTOMY Left 2002      No Known Allergies  Current Outpatient Prescriptions  Medication  Sig Dispense Refill  . albuterol (PROVENTIL HFA;VENTOLIN HFA) 108 (90 Base) MCG/ACT inhaler Inhale 2 puffs into the lungs every 6 (six) hours as needed for wheezing or shortness of breath. (Patient not taking: Reported on 11/22/2016) 1 Inhaler 1  . aspirin EC 81 MG tablet Take 81 mg by mouth daily.    Marland Kitchen atorvastatin (LIPITOR) 10 MG tablet Take 10 mg by mouth at bedtime.     . Fluticasone-Salmeterol (ADVAIR DISKUS) 250-50 MCG/DOSE AEPB Inhale 1 puff into the lungs 2 (two) times daily. 60 each 0  . HYDROcodone-acetaminophen (NORCO) 5-325 MG tablet Take 1-2 tablets by mouth every 6 (six) hours as needed. 50 tablet 0   No current facility-administered medications for this visit.         Physical Exam There were no vitals taken for this visit. Gen:  WD/WN, NAD Skin: incision C/D/I     Assessment/Plan: 1. AAA (abdominal aortic aneurysm) without rupture (HCC) Recommend: Patient is status post successful endovascular repair of the AAA.   No further intervention is required at this time.   No endoleak is detected and the aneurysm sac is stable.  The patient will continue antiplatelet therapy as prescribed as well as aggressive management of hyperlipidemia. Exercise is again strongly encouraged.   However, endografts require continued surveillance with ultrasound or CT scan. This is mandatory to detect any changes that allow repressurization of the aneurysm sac.  The patient is informed that this would be asymptomatic.  The patient is reminded  that lifelong routine surveillance is a necessity with an endograft. Patient will continue to follow-up at 3 months with ultrasound of the aorta.       Roberta [name] 12/21/2016, 11:50 AM   This note was created with Dragon medical transcription system.  Any errors from dictation are unintentional.

## 2016-12-25 ENCOUNTER — Telehealth (INDEPENDENT_AMBULATORY_CARE_PROVIDER_SITE_OTHER): Payer: Self-pay

## 2016-12-25 NOTE — Telephone Encounter (Signed)
Angel from homecare called stating they will be seeing the patient one last time this week and discharging her from that point because the patient does not have any issues at this moment. Unless he needs them to continue this will be her last visit from them.

## 2017-01-04 NOTE — H&P (Signed)
[facility] Admission History & Physical  MRN : [id number]  Roberta [name] is a 71 y.o. (1946-05-28) female who presents with chief complaint of No chief complaint on file. Marland Kitchen  History of Present Illness: The patient is seen for follow up evaluation of AAA status post CTA. There were no problems or complications related to the CT scan. The patient denies interval development of abdominal or back pain. No new lower extremity pain or discoloration of the toes.   The patient has a history of coronary artery disease, no recent episodes of angina or shortness of breath. The patient denies interval anaurosis fugax. There is o recent history of TIA symptoms or focal motor deficits. The patient denies PAD or claudication symptoms. There is a history of hyperlipidemia which is being treated with a statin.   CT angiography of the abdomen and pelvis shows an infrarenal AAA 5.08 cm.  No current facility-administered medications for this encounter.    Current Outpatient Prescriptions  Medication Sig Dispense Refill  . aspirin EC 81 MG tablet Take 81 mg by mouth daily.    Marland Kitchen atorvastatin (LIPITOR) 10 MG tablet Take 10 mg by mouth at bedtime.     . Fluticasone-Salmeterol (ADVAIR DISKUS) 250-50 MCG/DOSE AEPB Inhale 1 puff into the lungs 2 (two) times daily. 60 each 0  . albuterol (PROVENTIL HFA;VENTOLIN HFA) 108 (90 Base) MCG/ACT inhaler Inhale 2 puffs into the lungs every 6 (six) hours as needed for wheezing or shortness of breath. (Patient not taking: Reported on 11/22/2016) 1 Inhaler 1  . HYDROcodone-acetaminophen (NORCO) 5-325 MG tablet Take 1-2 tablets by mouth every 6 (six) hours as needed. 50 tablet 0    Past Medical History:  Diagnosis Date  . Breast cancer ([place]) 2002   left  . Cancer [facility]) 2002   left breast cancer  . COPD (chronic obstructive pulmonary disease) ([place])   . High cholesterol   . Personal history of chemotherapy     Past Surgical History:  Procedure  Laterality Date  . ABDOMINAL HYSTERECTOMY    . ENDOVASCULAR REPAIR/STENT GRAFT N/A 12/06/2016   Procedure: Endovascular Repair/Stent Graft;  Surgeon: Katha Cabal, MD;  Location: [place] [facility];  Service: Cardiovascular;  Laterality: N/A;  . MASTECTOMY Left 2002    Social History Social History  Substance Use Topics  . Smoking status: Current Every Day Smoker    Packs/day: 0.50    Types: Cigarettes  . Smokeless tobacco: Never Used  . Alcohol use No    Family History Family History  Problem Relation Age of Onset  . Breast cancer Neg Hx   No family history of bleeding/clotting disorders, porphyria or autoimmune disease   No Known Allergies   REVIEW OF SYSTEMS (Negative unless checked)  Constitutional: [] Weight loss  [] Fever  [] Chills Cardiac: [] Chest pain   [] Chest pressure   [] Palpitations   [] Shortness of breath when laying flat   [] Shortness of breath at rest   [] Shortness of breath with exertion. Vascular:  [] Pain in legs with walking   [] Pain in legs at rest   [] Pain in legs when laying flat   [] Claudication   [] Pain in feet when walking  [] Pain in feet at rest  [] Pain in feet when laying flat   [] History of DVT   [] Phlebitis   [x] Swelling in legs   [] Varicose veins   [] Non-healing ulcers Pulmonary:   [] Uses home oxygen   [] Productive cough   [] Hemoptysis   [] Wheeze  [] COPD   []   Asthma Neurologic:  [] Dizziness  [] Blackouts   [] Seizures   [] History of stroke   [] History of TIA  [] Aphasia   [] Temporary blindness   [] Dysphagia   [] Weakness or numbness in arms   [] Weakness or numbness in legs Musculoskeletal:  [] Arthritis   [] Joint swelling   [x] Joint pain   [] Low back pain Hematologic:  [] Easy bruising  [] Easy bleeding   [] Hypercoagulable state   [] Anemic  [] Hepatitis Gastrointestinal:  [] Blood in stool   [] Vomiting blood  [] Gastroesophageal reflux/heartburn   [] Difficulty swallowing. Genitourinary:  [] Chronic kidney disease   [] Difficult urination  [] Frequent urination   [] Burning with urination   [] Blood in urine Skin:  [] Rashes   [] Ulcers   [] Wounds Psychological:  [] History of anxiety   []  History of major depression.  Physical Examination  Vitals:   12/07/16 0900 12/07/16 1000 12/07/16 1100 12/07/16 1200  BP: 129/63 135/69 (!) 132/105   Pulse: 73 69 71 82  Resp: 19 18 20  (!) 25  Temp:      TempSrc:      SpO2: 92% 93% 95% 93%  Weight:      Height:       Body mass index is 24.07 kg/m. Gen: WD/WN, NAD Head: [place]/AT, No temporalis wasting. Prominent temp pulse not noted. Ear/Nose/Throat: Hearing grossly intact, nares w/o erythema or drainage, oropharynx w/o Erythema/Exudate,  Eyes: Conjunctiva clear, sclera non-icteric Neck: Trachea midline.  No JVD.  Pulmonary:  Good air movement, respirations not labored, no use of accessory muscles.  Cardiac: RRR, normal S1, S2. Vascular: Aneurysm is not palpable secondary to body habitus Vessel Right Left  Radial Palpable Palpable  Ulnar Palpable Palpable  Brachial Palpable Palpable  Aorta Not palpable N/A  Femoral Palpable Palpable  Popliteal Not Palpable Not Palpable  PT Not Palpable Not Palpable  DP Not Palpable Not Palpable   Gastrointestinal: soft, non-tender/non-distended. No guarding/reflex.  Musculoskeletal: M/S 5/5 throughout.  Extremities without ischemic changes.  No deformity or atrophy.  Neurologic: Sensation grossly intact in extremities.  Symmetrical.  Speech is fluent. Motor exam as listed above. Psychiatric: Judgment intact, Mood & affect appropriate for pt's clinical situation. Dermatologic: No rashes or ulcers noted.  No cellulitis or open wounds. Lymph : No Cervical, Axillary, or Inguinal lymphadenopathy.     CBC Lab Results  Component Value Date   WBC 12.6 (H) 12/07/2016   HGB 11.8 (L) 12/07/2016   HCT 36.0 12/07/2016   MCV 89.1 12/07/2016   PLT 237 12/07/2016    BMET    Component Value Date/Time   NA 141 12/07/2016 0311   K 3.9 12/07/2016 0311   CL 110 12/07/2016  0311   CO2 25 12/07/2016 0311   GLUCOSE 105 (H) 12/07/2016 0311   BUN 10 12/07/2016 0311   CREATININE 0.54 12/07/2016 0311   CALCIUM 8.5 (L) 12/07/2016 0311   GFRNONAA >60 12/07/2016 0311   GFRAA >60 12/07/2016 0311   CrCl cannot be calculated (Patient's most recent lab result is older than the maximum 21 days allowed.).  COAG Lab Results  Component Value Date   INR 0.95 11/27/2016    Radiology No results found.  Assessment/Plan 1. Abdominal aortic aneurysm without rupture: Recommend: The aneurysm is > 5 cm and therefore should undergo repair. Patient is status post CT scan of the abdominal aorta. The patient is a candidate for endovascular repair.   He will require cardiac clearance.   The patient will continue antiplatelet therapy as prescribed (since the patient is undergoing endovascular repair as opposed to  open repair) as well as aggressive management of hyperlipidemia. Exercise is again strongly encouraged.   The patient is reminded that lifetime routine surveillance is a necessity with an endograft.   The risks and benefits of AAA repair are reviewed with the patient.  All questions are answered.  Alternative therapies are also discussed.  The patient agrees to proceed with endovascular aneurysm repair.  Patient will follow-up with me in the office after the surgery.  2. Peripheral artery disease:  Recommend:  The patient has evidence of atherosclerosis of the lower extremities with claudication.  The patient does not voice lifestyle limiting changes at this point in time.  Noninvasive studies do not suggest clinically significant change.  No invasive studies, angiography or surgery at this time The patient should continue walking and begin a more formal exercise program.  The patient should continue antiplatelet therapy and aggressive treatment of the lipid abnormalities  No changes in the patient's medications at this time  The patient should continue wearing  graduated compression socks 10-15 mmHg strength to control the mild edema.   3. COPD: Continue pulmonary medications and aerosols as already ordered, these medications have been reviewed and there are no changes at this time.  4. Hyperlipidemia: Continue statin as ordered and reviewed, no changes at this time    Hortencia Pilar, MD  01/04/2017 1:04 PM

## 2017-01-17 ENCOUNTER — Other Ambulatory Visit: Payer: Self-pay | Admitting: Family Medicine

## 2017-01-17 DIAGNOSIS — R05 Cough: Secondary | ICD-10-CM

## 2017-01-17 DIAGNOSIS — R9389 Abnormal findings on diagnostic imaging of other specified body structures: Secondary | ICD-10-CM

## 2017-01-17 DIAGNOSIS — R059 Cough, unspecified: Secondary | ICD-10-CM

## 2017-01-22 ENCOUNTER — Encounter (INDEPENDENT_AMBULATORY_CARE_PROVIDER_SITE_OTHER): Payer: Self-pay

## 2017-01-23 ENCOUNTER — Ambulatory Visit
Admission: RE | Admit: 2017-01-23 | Discharge: 2017-01-23 | Disposition: A | Discharge status: Home / Self Care | Payer: Medicare Other | Source: Ambulatory Visit | Attending: Family Medicine | Admitting: Family Medicine

## 2017-01-23 DIAGNOSIS — R059 Cough, unspecified: Secondary | ICD-10-CM

## 2017-01-23 DIAGNOSIS — R9389 Abnormal findings on diagnostic imaging of other specified body structures: Secondary | ICD-10-CM

## 2017-01-23 DIAGNOSIS — J90 Pleural effusion, not elsewhere classified: Secondary | ICD-10-CM | POA: Diagnosis not present

## 2017-01-23 DIAGNOSIS — R938 Abnormal findings on diagnostic imaging of other specified body structures: Secondary | ICD-10-CM | POA: Diagnosis present

## 2017-01-23 DIAGNOSIS — R918 Other nonspecific abnormal finding of lung field: Secondary | ICD-10-CM | POA: Insufficient documentation

## 2017-01-23 DIAGNOSIS — I2510 Atherosclerotic heart disease of native coronary artery without angina pectoris: Secondary | ICD-10-CM | POA: Diagnosis not present

## 2017-01-23 DIAGNOSIS — J9811 Atelectasis: Secondary | ICD-10-CM | POA: Diagnosis not present

## 2017-01-23 DIAGNOSIS — I700 Atherosclerosis of aorta: Secondary | ICD-10-CM | POA: Insufficient documentation

## 2017-01-23 DIAGNOSIS — R05 Cough: Secondary | ICD-10-CM

## 2017-01-30 ENCOUNTER — Other Ambulatory Visit: Payer: Self-pay | Admitting: Specialist

## 2017-01-30 DIAGNOSIS — R918 Other nonspecific abnormal finding of lung field: Secondary | ICD-10-CM

## 2017-02-07 ENCOUNTER — Encounter
Admission: RE | Admit: 2017-02-07 | Discharge: 2017-02-07 | Disposition: A | Discharge status: Home / Self Care | Payer: Medicare Other | Source: Ambulatory Visit | Attending: Specialist | Admitting: Specialist

## 2017-02-07 DIAGNOSIS — R918 Other nonspecific abnormal finding of lung field: Secondary | ICD-10-CM | POA: Insufficient documentation

## 2017-02-07 LAB — GLUCOSE, CAPILLARY: Glucose-Capillary: 99 mg/dL (ref 65–99)

## 2017-02-07 MED ORDER — FLUDEOXYGLUCOSE F - 18 (FDG) INJECTION
13.0600 | Freq: Once | INTRAVENOUS | Status: AC | PRN
  Administered 2017-02-07: 13.06 via INTRAVENOUS

## 2017-02-12 ENCOUNTER — Inpatient Hospital Stay: Payer: Medicare Other | Attending: Oncology | Admitting: Oncology

## 2017-02-12 ENCOUNTER — Encounter: Payer: Self-pay | Admitting: Oncology

## 2017-02-12 VITALS — BP 121/76 | HR 76 | Temp 97.7°F | Resp 18 | Ht 64.57 in | Wt 145.9 lb

## 2017-02-12 DIAGNOSIS — R942 Abnormal results of pulmonary function studies: Secondary | ICD-10-CM

## 2017-02-12 DIAGNOSIS — Z79899 Other long term (current) drug therapy: Secondary | ICD-10-CM | POA: Diagnosis not present

## 2017-02-12 DIAGNOSIS — I714 Abdominal aortic aneurysm, without rupture: Secondary | ICD-10-CM | POA: Diagnosis not present

## 2017-02-12 DIAGNOSIS — F17210 Nicotine dependence, cigarettes, uncomplicated: Secondary | ICD-10-CM

## 2017-02-12 DIAGNOSIS — J90 Pleural effusion, not elsewhere classified: Secondary | ICD-10-CM | POA: Insufficient documentation

## 2017-02-12 DIAGNOSIS — J449 Chronic obstructive pulmonary disease, unspecified: Secondary | ICD-10-CM | POA: Diagnosis not present

## 2017-02-12 DIAGNOSIS — Z853 Personal history of malignant neoplasm of breast: Secondary | ICD-10-CM | POA: Diagnosis not present

## 2017-02-12 DIAGNOSIS — R911 Solitary pulmonary nodule: Secondary | ICD-10-CM

## 2017-02-12 DIAGNOSIS — E7800 Pure hypercholesterolemia, unspecified: Secondary | ICD-10-CM | POA: Diagnosis not present

## 2017-02-12 DIAGNOSIS — Z7982 Long term (current) use of aspirin: Secondary | ICD-10-CM | POA: Diagnosis not present

## 2017-02-12 DIAGNOSIS — Z23 Encounter for immunization: Secondary | ICD-10-CM | POA: Insufficient documentation

## 2017-02-12 DIAGNOSIS — I700 Atherosclerosis of aorta: Secondary | ICD-10-CM | POA: Diagnosis not present

## 2017-02-12 DIAGNOSIS — C3411 Malignant neoplasm of upper lobe, right bronchus or lung: Secondary | ICD-10-CM | POA: Diagnosis not present

## 2017-02-12 DIAGNOSIS — R918 Other nonspecific abnormal finding of lung field: Secondary | ICD-10-CM | POA: Diagnosis not present

## 2017-02-12 DIAGNOSIS — R042 Hemoptysis: Secondary | ICD-10-CM | POA: Insufficient documentation

## 2017-02-12 DIAGNOSIS — R9389 Abnormal findings on diagnostic imaging of other specified body structures: Secondary | ICD-10-CM

## 2017-02-12 NOTE — Progress Notes (Signed)
Pt sent here from Dr. Raul Del office and they were going to se up bronch through [place] [facility]. Pt has not heard from there and I called [place] office and was told above. Pt. Has cut down on smoking to 1/2 ppd from 1 ppd.

## 2017-02-12 NOTE — Progress Notes (Signed)
Hematology/Oncology Consult note [facility] [phone] [phone]  Patient Care Team: Gayland Curry, MD as PCP - General (Family Medicine) Gayland Curry, MD (Family Medicine) Telford Nab, RN as Registered Nurse   Name of the patient: Roberta [name]  [id number]  10/21/1945    Reason for referral- RUL lung mass   Referring physician- Dr. Gayland Curry  Date of visit: 02/12/17   History of presenting illness- 1. Patient is a 71 year old female with a long-standing history of smoking and smoked 1 pack per day for more than 50 years and is now down to half a pack per day. She also has COPD and recently had abdominal aortic aneurysm status post stenting. She presented with symptoms of mild hemoptysis in August 2018 which prompted CT chest  2. CT chest on 01/23/2017 showed:IMPRESSION: 1. Large right suprahilar mass extending into the mediastinum consistent with primary lung carcinoma with mediastinal extension. 2. Partial atelectasis of right upper lobe due to this large suprahilar mass with probable postobstructive pneumonitis in the right upper lobe peripherally. 3. Small right pleural effusion. 4. Severe thoracic aortic atherosclerosis. Diffuse coronary artery Calcifications.   3. This was followed by a PET CT scan on 02/07/2017 showed:IMPRESSION: 1. Hypermetabolic disease in the upper right hilum and mediastinum compatible with neoplasm. 2. Mild FDG accumulation in peripheral areas of patchy airspace disease which may be secondary sites of neoplasm or related to infection/inflammation. 3. No evidence for distant metastases in the neck, abdomen, or pelvis.   4. Patient is scheduled to see Dr. Stoney Bang from pulmonary tomorrow to discuss the bronchoscopy   5. Patient lives with her sister and is independent of her ADLs. She reports occasional blood-tinged sputum but no significant frank hemoptysis.  Her appetite is good  and she denies any unintentional weight loss. She denies any pain. She does have a history of breast cancer in 2002 status post left mastectomy followed by adjuvant chemotherapy. This was treated by Dr. Oliva Bustard. She did not receive any adjuvant radiation.  ECOG PS- 1  Pain scale- 0   Review of systems- Review of Systems  Constitutional: Negative for chills, fever, malaise/fatigue and weight loss.  HENT: Negative for congestion, ear discharge and nosebleeds.   Eyes: Negative for blurred vision.  Respiratory: Positive for cough and hemoptysis. Negative for sputum production, shortness of breath and wheezing.   Cardiovascular: Negative for chest pain, palpitations, orthopnea and claudication.  Gastrointestinal: Negative for abdominal pain, blood in stool, constipation, diarrhea, heartburn, melena, nausea and vomiting.  Genitourinary: Negative for dysuria, flank pain, frequency, hematuria and urgency.  Musculoskeletal: Negative for back pain, joint pain and myalgias.  Skin: Negative for rash.  Neurological: Negative for dizziness, tingling, focal weakness, seizures, weakness and headaches.  Endo/Heme/Allergies: Does not bruise/bleed easily.  Psychiatric/Behavioral: Negative for depression and suicidal ideas. The patient does not have insomnia.     No Known Allergies  Patient Active Problem List   Diagnosis Date Noted  . AAA (abdominal aortic aneurysm) without rupture ([place]) 11/02/2016  . COPD mixed type ([place]) 11/02/2016  . Hyperlipidemia 11/02/2016  . PAD (peripheral artery disease) ([place]) 11/02/2016     Past Medical History:  Diagnosis Date  . AAA (abdominal aortic aneurysm) ([place])   . Breast cancer ([place]) 2002   left  . COPD (chronic obstructive pulmonary disease) ([place])   . High cholesterol   . Personal history of chemotherapy   . Tobacco abuse      Past Surgical History:  Procedure Laterality Date  .  ABDOMINAL HYSTERECTOMY    . ENDOVASCULAR REPAIR/STENT GRAFT N/A 12/06/2016     Procedure: Endovascular Repair/Stent Graft;  Surgeon: Katha Cabal, MD;  Location: [facility];  Service: Cardiovascular;  Laterality: N/A;  . MASTECTOMY Left 2002    Social History   Social History  . Marital status: Widowed    Spouse name: N/A  . Number of children: N/A  . Years of education: N/A   Occupational History  . Not on file.   Social History Main Topics  . Smoking status: Current Every Day Smoker    Packs/day: 0.50    Types: Cigarettes  . Smokeless tobacco: Never Used  . Alcohol use No  . Drug use: No  . Sexual activity: No   Other Topics Concern  . Not on file   Social History Narrative  . No narrative on file     Family History  Problem Relation Age of Onset  . Breast cancer Neg Hx      Current Outpatient Prescriptions:  .  albuterol (PROVENTIL HFA;VENTOLIN HFA) 108 (90 Base) MCG/ACT inhaler, Inhale 2 puffs into the lungs every 6 (six) hours as needed for wheezing or shortness of breath., Disp: 1 Inhaler, Rfl: 1 .  aspirin EC 81 MG tablet, Take 81 mg by mouth daily., Disp: , Rfl:  .  atorvastatin (LIPITOR) 10 MG tablet, Take 10 mg by mouth at bedtime. , Disp: , Rfl:  .  Fluticasone-Salmeterol (ADVAIR DISKUS) 250-50 MCG/DOSE AEPB, Inhale 1 puff into the lungs 2 (two) times daily., Disp: 60 each, Rfl: 0 .  HYDROcodone-homatropine (HYCODAN) 5-1.5 MG/5ML syrup, Take 5 mLs by mouth every 6 (six) hours as needed for cough., Disp: , Rfl:    Physical exam:  Vitals:   02/12/17 1128  BP: 121/76  Pulse: 76  Resp: 18  Temp: 97.7 F (36.5 C)  TempSrc: Tympanic  Weight: 145 lb 15.1 oz (66.2 kg)  Height: 5' 4.57" (1.64 m)   Physical Exam  Constitutional: She is oriented to person, place, and time and well-developed, well-nourished, and in no distress.  HENT:  Head: Normocephalic and atraumatic.  Poor dentition  Eyes: Pupils are equal, round, and reactive to light. EOM are normal.  Neck: Normal range of motion.  Cardiovascular: Normal  rate, regular rhythm and normal heart sounds.   Pulmonary/Chest: Effort normal and breath sounds normal.  Abdominal: Soft. Bowel sounds are normal.  Neurological: She is alert and oriented to person, place, and time.  Skin: Skin is warm and dry.   Patient is status post left mastectomy without any reconstruction. Well-healed surgical scar is noted and there is no evidence of chest wall recurrence. No axillary adenopathy    CMP Latest Ref Rng & Units 12/07/2016  Glucose 65 - 99 mg/dL 105(H)  BUN 6 - 20 mg/dL 10  Creatinine 0.44 - 1.00 mg/dL 0.54  Sodium 135 - 145 mmol/L 141  Potassium 3.5 - 5.1 mmol/L 3.9  Chloride 101 - 111 mmol/L 110  CO2 22 - 32 mmol/L 25  Calcium 8.9 - 10.3 mg/dL 8.5(L)  Total Protein 6.5 - 8.1 g/dL -  Total Bilirubin 0.3 - 1.2 mg/dL -  Alkaline Phos 38 - 126 U/L -  AST 15 - 41 U/L -  ALT 14 - 54 U/L -   CBC Latest Ref Rng & Units 12/07/2016  WBC 3.6 - 11.0 K/uL 12.6(H)  Hemoglobin 12.0 - 16.0 g/dL 11.8(L)  Hematocrit 35.0 - 47.0 % 36.0  Platelets 150 - 440 K/uL 237  No images are attached to the encounter.  Ct Chest W Contrast  Result Date: 01/23/2017 CLINICAL DATA:  Post repair of abdominal aortic aneurysm in July of 2018, now coughing up blood for 2 weeks, right-sided chest pain EXAM: CT CHEST WITH CONTRAST TECHNIQUE: Multidetector CT imaging of the chest was performed during intravenous contrast administration. CONTRAST:  75 cc Isovue-300 COMPARISON:  Chest x-ray of 03/24/2015 FINDINGS: Cardiovascular: Severe thoracic aortic atherosclerotic change is noted throughout the entire thoracic aorta. Diffuse coronary artery calcifications are present. The heart is mildly enlarged. No pericardial effusion is noted. The mid ascending thoracic aorta measures 30 mm in diameter. The pulmonary arteries opacify with no central abnormality. Mediastinum/Nodes: There is a large necrotic-appearing node precarinal on position on image 55 series 2 measuring 22 mm in diameter  with attenuation of 44 HU. However, there is a large right suprahilar soft tissue mass partially necrotic. On axial image 54 series to this right suprahilar mass measures 29 x 53 mm and in most consistent with primary lung carcinoma with mediastinal invasion. The thyroid gland is unremarkable. Lungs/Pleura: Some centrilobular emphysema is noted in the upper lobes. There is parenchymal opacity within the right upper lobe probably due to postobstructive pneumonitis. Also partial atelectasis of the right upper lobe is noted. The right upper lobe bronchus appears occluded with narrowing of the pulmonary artery to the right upper lobe as well by the large right suprahilar mass described above. Probable atelectasis is present posteriorly in the right lower lobe with a small right pleural effusion present as well. On the left only a single 2 mm noncalcified lung nodule is present in the left lower lobe on image 81 series 3. Upper Abdomen: The liver enhances with no obvious abnormality. Endovascular stent graft is noted for repair of abdominal aortic aneurysm within the upper abdomen, not completely assessed. Musculoskeletal: There is a mild thoracolumbar curvature. No lytic or blastic bony lesion is seen. IMPRESSION: 1. Large right suprahilar mass extending into the mediastinum consistent with primary lung carcinoma with mediastinal extension. 2. Partial atelectasis of right upper lobe due to this large suprahilar mass with probable postobstructive pneumonitis in the right upper lobe peripherally. 3. Small right pleural effusion. 4. Severe thoracic aortic atherosclerosis. Diffuse coronary artery calcifications. Electronically Signed   By: Ivar Drape M.D.   On: 01/23/2017 11:28   Nm Pet Image Initial (pi) Skull Base To Thigh  Result Date: 02/07/2017 CLINICAL DATA:  Initial treatment strategy for right lung mass. EXAM: NUCLEAR MEDICINE PET SKULL BASE TO THIGH TECHNIQUE: 13.1 mCi F-18 FDG was injected intravenously.  Full-ring PET imaging was performed from the skull base to thigh after the radiotracer. CT data was obtained and used for attenuation correction and anatomic localization. FASTING BLOOD GLUCOSE:  Value: 99 mg/dl COMPARISON:  CT chest 01/23/2017 FINDINGS: NECK: No hypermetabolic lymph nodes in the neck. CHEST: The abnormal soft tissue in the superior right hilum extending into the mediastinum is markedly hypermetabolic with SUV max = 65.7. Focal airspace opacity in the lateral aspect of the right apex shows low level hypermetabolism with SUV max = 2.4. Subtle area of airspace disease in the peripheral aspect of the posterior right lower lobe, along the minor fissure is hypermetabolic with SUV max = 3. Coronary artery and thoracic aortic atherosclerosis evident. ABDOMEN/PELVIS: No abnormal hypermetabolic activity within the liver, pancreas, adrenal glands, or spleen. No hypermetabolic lymph nodes in the abdomen or pelvis. Patient is status post endograft repair of abdominal aortic aneurysm. Diverticular changes noted left  colon without diverticulitis. SKELETON: No focal hypermetabolic activity to suggest skeletal metastasis. IMPRESSION: 1. Hypermetabolic disease in the upper right hilum and mediastinum compatible with neoplasm. 2. Mild FDG accumulation in peripheral areas of patchy airspace disease which may be secondary sites of neoplasm or related to infection/inflammation. 3. No evidence for distant metastases in the neck, abdomen, or pelvis. Electronically Signed   By: Misty Stanley M.D.   On: 02/07/2017 09:27    Assessment and plan- Patient is a 71 y.o. female with RUL lung mass and mediastinal invasion  I discussed the results of the CT chest and PET/CT scan with the patient in detail. I have also reviewed her imaging independently. Patient has a large right upper lobe mass with mediastinal extension. There is also a concern for possible malignancy in the posterior right lower lobe. She will be seeing Dr.  Mortimer Fries from pulmonary tomorrow to discuss bronchoscopy for definitive tissue diagnosis. Findings are concerning for lung cancer probably non-small cell lung cancer. Based on her imaging she does not seem to have any distant metastatic disease. 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 Given the mediastinal invasion, she is not a surgical candidate  At this time I will await pathology results from bronchoscopy. I will opt also obtain an MRI of her brain with and without contrast to complete her staging workup. I will tentatively see her back in 2 weeks' time and also schedule an appointment to see radiation oncology at the same time. I will discuss her case at the tumor Board this week. Check cbc, cmp in 2 weeks time   Thank you for this kind referral and the opportunity to participate in the care of this patient   Visit Diagnosis 1. Mass of upper lobe of right lung   2. Abnormal CT of the chest   3. Solitary pulmonary nodule     Dr. Randa Evens, MD, MPH [place] at [facility] Pager- [phone] 02/12/2017  12:00 PM

## 2017-02-13 ENCOUNTER — Ambulatory Visit (INDEPENDENT_AMBULATORY_CARE_PROVIDER_SITE_OTHER): Payer: Medicare Other | Admitting: Internal Medicine

## 2017-02-13 ENCOUNTER — Encounter: Payer: Self-pay | Admitting: Internal Medicine

## 2017-02-13 VITALS — BP 128/70 | HR 75 | Ht 64.5 in | Wt 146.0 lb

## 2017-02-13 DIAGNOSIS — R918 Other nonspecific abnormal finding of lung field: Secondary | ICD-10-CM

## 2017-02-13 NOTE — Progress Notes (Signed)
Name: Roberta [name] MRN: [id number] DOB: May 14, 1946     CONSULTATION DATE: 02/13/2017   REFERRING MD :  RAO  CHIEF COMPLAINT:  Coughing up blood  STUDIES:  CT chest 01/23/17 I have Independently reviewed images of CT chest   on 02/13/2017 Interpretation: Large right lung mass with extensive adenopathy    HISTORY OF PRESENT ILLNESS: 71 year old pleasant white female seen today for abnormal CT scan findings Patient has had coughing episodes with blood-tinged sputum for the past several months progressively worse over the last 1 month however the last 2 weeks has intermittent hemoptysis Patient has chronic shortness of breath and chronic dyspnea and exertion patient has a diagnosis of emphysema and COPD Patient denies any fevers chills nausea vomiting diarrhea constipation No weight loss noted Patient's appetite is good  patient smokes one pack a day for 50 years  Patient subsequently had a chest x-ray which showed some abnormal findings would lead which led to a CT scan of her chest Which revealed a very large right lung mass most likely consistent with primary lung cancer  Patient states she had left breast cancer in the past  I have reviewed the CT scan findings with the patient and the family and after further evaluation patient will need bronchoscopic evaluation and diagnosis for further care and management   The Risks and Benefits of the Bronchoscopy with EBUS/Bronchscopy were explained to patient/family and I have discussed the risk for acute bleeding, increased chance of infection, increased chance of respiratory failure, stroke, cardiac arrest and death. I have also explained to avoid all types of NSAIDs to decrease chance of bleeding, and to avoid food and drinks the midnight prior to procedure.  The procedure consists of a video camera with a light source to be placed and inserted  into the lungs to  look for abnormal tissue and to obtain tissue samples by using  needle and biopsy tools.  The patient/family understand the risks and benefits and have agreed to proceed with procedure.   PAST MEDICAL HISTORY :   has a past medical history of AAA (abdominal aortic aneurysm) ([place]); Breast cancer ([place]) (2002); COPD (chronic obstructive pulmonary disease) ([place]); High cholesterol; Personal history of chemotherapy; and Tobacco abuse.  has a past surgical history that includes Mastectomy (Left, 2002); Abdominal hysterectomy; and ENDOVASCULAR REPAIR/STENT GRAFT (N/A, 12/06/2016). Prior to Admission medications   Medication Sig Start Date End Date Taking? Authorizing Provider  albuterol (PROVENTIL HFA;VENTOLIN HFA) 108 (90 Base) MCG/ACT inhaler Inhale 2 puffs into the lungs every 6 (six) hours as needed for wheezing or shortness of breath. 03/23/16  Yes Frederich Cha, MD  aspirin EC 81 MG tablet Take 81 mg by mouth daily.   Yes [provider]  atorvastatin (LIPITOR) 10 MG tablet Take 10 mg by mouth at bedtime.  08/11/16  Yes [provider]  Fluticasone-Salmeterol (ADVAIR DISKUS) 250-50 MCG/DOSE AEPB Inhale 1 puff into the lungs 2 (two) times daily. 03/23/16  Yes Frederich Cha, MD  HYDROcodone-homatropine [facility]) 5-1.5 MG/5ML syrup Take 5 mLs by mouth every 6 (six) hours as needed for cough.   Yes [provider]  ipratropium (ATROVENT) 0.02 % nebulizer solution Take 0.5 mg by nebulization 4 (four) times daily.   Yes [provider]   No Known Allergies  FAMILY HISTORY:  family history is not on file. SOCIAL HISTORY:  reports that she has been smoking Cigarettes.  She has been smoking about 0.50 packs per day. She has never used smokeless tobacco.  She reports that she does not drink alcohol or use drugs.  REVIEW OF SYSTEMS:   Constitutional: Negative for fever, chills, weight loss, malaise/fatigue and diaphoresis.  HENT: Negative for hearing loss, ear pain, nosebleeds, congestion, sore throat, neck pain, tinnitus and ear  discharge.   Eyes: Negative for blurred vision, double vision, photophobia, pain, discharge and redness.  Respiratory: + cough, + hemoptysis, sputum production, + shortness of breath, + wheezing and stridor.   Cardiovascular: Negative for chest pain, palpitations, orthopnea, claudication, leg swelling and PND.  Gastrointestinal: Negative for heartburn, nausea, vomiting, abdominal pain, diarrhea, constipation, blood in stool and melena.  Genitourinary: Negative for dysuria, urgency, frequency, hematuria and flank pain.  Musculoskeletal: Negative for myalgias, back pain, joint pain and falls.  Skin: Negative for itching and rash.  Neurological: Negative for dizziness, tingling, tremors, sensory change, speech change, focal weakness, seizures, loss of consciousness, weakness and headaches.  Endo/Heme/Allergies: Negative for environmental allergies and polydipsia. Does not bruise/bleed easily.  ALL OTHER ROS ARE NEGATIVE    BP 128/70 (BP Location: Left Arm, Cuff Size: Normal)   Pulse 75   Ht 5' 4.5" (1.638 m)   Wt 146 lb (66.2 kg)   SpO2 95%   BMI 24.67 kg/m    Physical Examination:   GENERAL:NAD, no fevers, chills, no weakness no fatigue,  HEAD: Normocephalic, atraumatic.  EYES: Pupils equal, round, reactive to light. Extraocular muscles intact. No scleral icterus.  MOUTH: Moist mucosal membrane, poor dentition   EAR, NOSE, THROAT: Clear without exudates. No external lesions.  NECK: Supple. No thyromegaly. No nodules. No JVD.  PULMONARY:CTA B/L no wheezes, no crackles, no rhonchi CARDIOVASCULAR: S1 and S2. Regular rate and rhythm. No murmurs, rubs, or gallops. No edema.  GASTROINTESTINAL: Soft, nontender, nondistended. No masses. Positive bowel sounds.  MUSCULOSKELETAL: No swelling, clubbing, or edema. Range of motion full in all extremities.  NEUROLOGIC: Cranial nerves II through XII are intact. No gross focal neurological deficits.  SKIN: No ulceration, lesions, rashes, or  cyanosis. Skin warm and dry. Turgor intact.  PSYCHIATRIC: Mood, affect within normal limits. The patient is awake, alert and oriented x 3. Insight, judgment intact.      ASSESSMENT / PLAN: 71 year old pleasant white female seen today for hemoptysis secondary to abnormal CT chest findings of a very large right lung mass consistent with primary lung cancer in the setting of extensive smoking history and tobacco abuse as well as deconditioned state   I have discussed the next step of management which is to undergo bronchoscopy for airway examination and tissue sampling to confirm diagnosis, and at this time patient understands the risks and benefits and would like to proceed with bronchoscopy  Patient/Family are satisfied with Plan of action and management. All questions answered  Corrin Parker, M.D.  [facility]  Medical Director [place] Director [facility] Cardio-Pulmonary Department

## 2017-02-13 NOTE — Patient Instructions (Signed)
Stop aspirin therapy Plan for bronchoscopy procedure next week   The Risks and Benefits of the Bronchoscopy with EBUS/Bronchscopy  were explained to patient/family and I have discussed the risk for acute bleeding, increased chance of infection, increased chance of respiratory failure and cardiac arrest and death and stroke. I have also explained to avoid all types of NSAIDs to decrease chance of bleeding, and to avoid food and drinks the midnight prior to procedure.  The procedure consists of a video camera with a light source to be placed and inserted  into the lungs to  look for abnormal tissue and to obtain tissue samples by using needle and biopsy tools.  The patient/family understand the risks and benefits and have agreed to proceed with procedure.

## 2017-02-15 ENCOUNTER — Encounter
Admission: RE | Admit: 2017-02-15 | Discharge: 2017-02-15 | Disposition: A | Discharge status: Home / Self Care | Payer: Medicare Other | Source: Ambulatory Visit | Attending: Internal Medicine | Admitting: Internal Medicine

## 2017-02-15 DIAGNOSIS — Z9221 Personal history of antineoplastic chemotherapy: Secondary | ICD-10-CM | POA: Insufficient documentation

## 2017-02-15 DIAGNOSIS — Z853 Personal history of malignant neoplasm of breast: Secondary | ICD-10-CM | POA: Insufficient documentation

## 2017-02-15 DIAGNOSIS — F17210 Nicotine dependence, cigarettes, uncomplicated: Secondary | ICD-10-CM | POA: Insufficient documentation

## 2017-02-15 DIAGNOSIS — Z7951 Long term (current) use of inhaled steroids: Secondary | ICD-10-CM | POA: Insufficient documentation

## 2017-02-15 DIAGNOSIS — R0602 Shortness of breath: Secondary | ICD-10-CM | POA: Insufficient documentation

## 2017-02-15 DIAGNOSIS — Z01818 Encounter for other preprocedural examination: Secondary | ICD-10-CM | POA: Insufficient documentation

## 2017-02-15 DIAGNOSIS — Z79891 Long term (current) use of opiate analgesic: Secondary | ICD-10-CM | POA: Insufficient documentation

## 2017-02-15 DIAGNOSIS — Z7982 Long term (current) use of aspirin: Secondary | ICD-10-CM | POA: Insufficient documentation

## 2017-02-15 DIAGNOSIS — Z90710 Acquired absence of both cervix and uterus: Secondary | ICD-10-CM | POA: Insufficient documentation

## 2017-02-15 DIAGNOSIS — R0600 Dyspnea, unspecified: Secondary | ICD-10-CM | POA: Insufficient documentation

## 2017-02-15 DIAGNOSIS — Z79899 Other long term (current) drug therapy: Secondary | ICD-10-CM | POA: Insufficient documentation

## 2017-02-15 DIAGNOSIS — R918 Other nonspecific abnormal finding of lung field: Secondary | ICD-10-CM | POA: Insufficient documentation

## 2017-02-15 DIAGNOSIS — I714 Abdominal aortic aneurysm, without rupture: Secondary | ICD-10-CM | POA: Insufficient documentation

## 2017-02-15 DIAGNOSIS — Z9012 Acquired absence of left breast and nipple: Secondary | ICD-10-CM | POA: Insufficient documentation

## 2017-02-15 DIAGNOSIS — Z9889 Other specified postprocedural states: Secondary | ICD-10-CM | POA: Insufficient documentation

## 2017-02-15 DIAGNOSIS — J439 Emphysema, unspecified: Secondary | ICD-10-CM | POA: Insufficient documentation

## 2017-02-15 DIAGNOSIS — R042 Hemoptysis: Secondary | ICD-10-CM | POA: Insufficient documentation

## 2017-02-15 NOTE — Patient Instructions (Signed)
  Your procedure is scheduled on: Monday 02/19/17 Report to Day Surgery. To find out your arrival time please call [phone] between 1PM - 3PM on Friday 02/16/17.  Remember: Instructions that are not followed completely may result in serious medical risk, up to and including death, or upon the discretion of your surgeon and anesthesiologist your surgery may need to be rescheduled.    __x__ 1. Do not eat food after midnight the night before your procedure. No gum chewing or hard candies. You may drink clear liquids up to 2 hours before you are scheduled to arrive for your surgery- DO not drink clear liquids within 2 hours of the start of your surgery.  Clear Liquids include: water, apple juice without pulp, clear carbohydrate drink such as Clearfast of Gartorade, Black Coffee or Tea (Do not add anything to coffee or tea).    __x__ 2. No Alcohol for 24 hours before or after surgery.   __x__ 3. Do Not Smoke For 24 Hours Prior to Your Surgery.   ____ 4. Bring all medications with you on the day of surgery if instructed.    _x___ 5. Notify your doctor if there is any change in your medical condition     (cold, fever, infections).       Do not wear jewelry, make-up, hairpins, clips or nail polish.  Do not wear lotions, powders, or perfumes. You may wear deodorant.  Do not shave 48 hours prior to surgery. Men may shave face and neck.  Do not bring valuables to the hospital.    [facility] is not responsible for any belongings or valuables.               Contacts, dentures or bridgework may not be worn into surgery.  Leave your suitcase in the car. After surgery it may be brought to your room.  For patients admitted to the hospital, discharge time is determined by your                treatment team.   Patients discharged the day of surgery will not be allowed to drive home.   Please read over the following fact sheets that you were given:      __x__ Take these  medicines the morning of surgery with A SIP OF WATER:    1. HYDROcodone-homatropine (HYCODAN) 5-1.5 MG/5ML syrup  2.   3.   4.  5.  6.  ____ Fleet Enema (as directed)   ____ Use CHG Soap as directed  ____ Use inhalers on the day of surgery fluticasone-salmeterol (ADVAIR HFA) 115-21 MCG/ACT inhaler, ipratropium (ATROVENT) 0.02 % nebulizer solution, albuterol (PROVENTIL HFA;VENTOLIN HFA) 108 (90 Base) MCG/ACT inhaler and bring to hospital  ____ Stop metformin 2 days prior to surgery    ____ Take 1/2 of usual insulin dose the night before surgery and none on the morning of surgery.   ____ Stop Coumadin/Plavix/aspirin on   ____ Stop Anti-inflammatories on    ____ Stop supplements until after surgery.    ____ Bring C-Pap to the hospital.

## 2017-02-19 ENCOUNTER — Ambulatory Visit: Payer: Medicare Other | Admitting: Certified Registered"

## 2017-02-19 ENCOUNTER — Ambulatory Visit
Admission: RE | Admit: 2017-02-19 | Discharge: 2017-02-19 | Disposition: A | Discharge status: Home / Self Care | Payer: Medicare Other | Source: Ambulatory Visit | Attending: Internal Medicine | Admitting: Internal Medicine

## 2017-02-19 ENCOUNTER — Encounter: Payer: Self-pay | Admitting: *Deleted

## 2017-02-19 ENCOUNTER — Encounter
Admission: RE | Disposition: A | Discharge status: Home / Self Care | Payer: Self-pay | Source: Ambulatory Visit | Attending: Internal Medicine

## 2017-02-19 DIAGNOSIS — R0609 Other forms of dyspnea: Secondary | ICD-10-CM | POA: Insufficient documentation

## 2017-02-19 DIAGNOSIS — Z7982 Long term (current) use of aspirin: Secondary | ICD-10-CM | POA: Diagnosis not present

## 2017-02-19 DIAGNOSIS — I739 Peripheral vascular disease, unspecified: Secondary | ICD-10-CM | POA: Insufficient documentation

## 2017-02-19 DIAGNOSIS — F17210 Nicotine dependence, cigarettes, uncomplicated: Secondary | ICD-10-CM | POA: Diagnosis not present

## 2017-02-19 DIAGNOSIS — E7800 Pure hypercholesterolemia, unspecified: Secondary | ICD-10-CM | POA: Diagnosis not present

## 2017-02-19 DIAGNOSIS — C3411 Malignant neoplasm of upper lobe, right bronchus or lung: Secondary | ICD-10-CM | POA: Insufficient documentation

## 2017-02-19 DIAGNOSIS — I714 Abdominal aortic aneurysm, without rupture: Secondary | ICD-10-CM | POA: Insufficient documentation

## 2017-02-19 DIAGNOSIS — Z90710 Acquired absence of both cervix and uterus: Secondary | ICD-10-CM | POA: Diagnosis not present

## 2017-02-19 DIAGNOSIS — J449 Chronic obstructive pulmonary disease, unspecified: Secondary | ICD-10-CM | POA: Diagnosis not present

## 2017-02-19 DIAGNOSIS — Z9012 Acquired absence of left breast and nipple: Secondary | ICD-10-CM | POA: Insufficient documentation

## 2017-02-19 DIAGNOSIS — R918 Other nonspecific abnormal finding of lung field: Secondary | ICD-10-CM

## 2017-02-19 DIAGNOSIS — Z79899 Other long term (current) drug therapy: Secondary | ICD-10-CM | POA: Insufficient documentation

## 2017-02-19 DIAGNOSIS — Z853 Personal history of malignant neoplasm of breast: Secondary | ICD-10-CM | POA: Diagnosis not present

## 2017-02-19 DIAGNOSIS — Z9221 Personal history of antineoplastic chemotherapy: Secondary | ICD-10-CM | POA: Insufficient documentation

## 2017-02-19 HISTORY — PX: ENDOBRONCHIAL ULTRASOUND: SHX5096

## 2017-02-19 SURGERY — ENDOBRONCHIAL ULTRASOUND (EBUS)
Anesthesia: General

## 2017-02-19 MED ORDER — SODIUM CHLORIDE 0.9 % IJ SOLN
INTRAMUSCULAR | Status: AC
  Filled 2017-02-19: qty 10

## 2017-02-19 MED ORDER — PHENYLEPHRINE HCL 10 MG/ML IJ SOLN
INTRAMUSCULAR | Status: DC | PRN
  Administered 2017-02-19: 100 ug via INTRAVENOUS

## 2017-02-19 MED ORDER — ONDANSETRON HCL 4 MG/2ML IJ SOLN: 4.0000 mg | Freq: Once | INTRAMUSCULAR | Status: DC | PRN

## 2017-02-19 MED ORDER — ONDANSETRON HCL 4 MG/2ML IJ SOLN
INTRAMUSCULAR | Status: DC | PRN
  Administered 2017-02-19: 4 mg via INTRAVENOUS

## 2017-02-19 MED ORDER — SUCCINYLCHOLINE CHLORIDE 20 MG/ML IJ SOLN
INTRAMUSCULAR | Status: DC | PRN
  Administered 2017-02-19: 80 mg via INTRAVENOUS

## 2017-02-19 MED ORDER — ACETAMINOPHEN 500 MG PO TABS
1000.0000 mg | ORAL_TABLET | Freq: Once | ORAL | Status: AC
  Administered 2017-02-19: 1000 mg via ORAL

## 2017-02-19 MED ORDER — PROPOFOL 500 MG/50ML IV EMUL
INTRAVENOUS | Status: AC
  Filled 2017-02-19: qty 50

## 2017-02-19 MED ORDER — GLYCOPYRROLATE 0.2 MG/ML IJ SOLN
INTRAMUSCULAR | Status: DC | PRN
  Administered 2017-02-19: 0.2 mg via INTRAVENOUS

## 2017-02-19 MED ORDER — PHENYLEPHRINE HCL 10 MG/ML IJ SOLN
INTRAMUSCULAR | Status: AC
  Filled 2017-02-19: qty 1

## 2017-02-19 MED ORDER — PROPOFOL 500 MG/50ML IV EMUL
INTRAVENOUS | Status: DC | PRN
  Administered 2017-02-19: 125 ug/kg/min via INTRAVENOUS

## 2017-02-19 MED ORDER — FAMOTIDINE 20 MG PO TABS
20.0000 mg | ORAL_TABLET | Freq: Once | ORAL | Status: AC
  Administered 2017-02-19: 20 mg via ORAL

## 2017-02-19 MED ORDER — ROCURONIUM BROMIDE 50 MG/5ML IV SOLN
INTRAVENOUS | Status: AC
  Filled 2017-02-19: qty 1

## 2017-02-19 MED ORDER — SUCCINYLCHOLINE CHLORIDE 20 MG/ML IJ SOLN
INTRAMUSCULAR | Status: AC
  Filled 2017-02-19: qty 1

## 2017-02-19 MED ORDER — PROPOFOL 10 MG/ML IV BOLUS
INTRAVENOUS | Status: DC | PRN
  Administered 2017-02-19: 100 mg via INTRAVENOUS

## 2017-02-19 MED ORDER — PROPOFOL 10 MG/ML IV BOLUS
INTRAVENOUS | Status: AC
  Filled 2017-02-19: qty 20

## 2017-02-19 MED ORDER — ONDANSETRON HCL 4 MG/2ML IJ SOLN
INTRAMUSCULAR | Status: AC
  Filled 2017-02-19: qty 2

## 2017-02-19 MED ORDER — FENTANYL CITRATE (PF) 100 MCG/2ML IJ SOLN: 25.0000 ug | INTRAMUSCULAR | Status: DC | PRN

## 2017-02-19 MED ORDER — LIDOCAINE HCL 2 % IJ SOLN
INTRAMUSCULAR | Status: AC
  Filled 2017-02-19: qty 10

## 2017-02-19 MED ORDER — DEXAMETHASONE SODIUM PHOSPHATE 10 MG/ML IJ SOLN
INTRAMUSCULAR | Status: DC | PRN
  Administered 2017-02-19: 10 mg via INTRAVENOUS

## 2017-02-19 MED ORDER — GLYCOPYRROLATE 0.2 MG/ML IJ SOLN
INTRAMUSCULAR | Status: AC
  Filled 2017-02-19: qty 1

## 2017-02-19 MED ORDER — EPHEDRINE SULFATE 50 MG/ML IJ SOLN
INTRAMUSCULAR | Status: AC
  Filled 2017-02-19: qty 1

## 2017-02-19 MED ORDER — LIDOCAINE HCL (CARDIAC) 20 MG/ML IV SOLN
INTRAVENOUS | Status: DC | PRN
  Administered 2017-02-19: 50 mg via INTRAVENOUS

## 2017-02-19 MED ORDER — DEXAMETHASONE SODIUM PHOSPHATE 10 MG/ML IJ SOLN
INTRAMUSCULAR | Status: AC
  Filled 2017-02-19: qty 1

## 2017-02-19 MED ORDER — SODIUM CHLORIDE FLUSH 0.9 % IV SOLN
INTRAVENOUS | Status: AC
  Filled 2017-02-19: qty 10

## 2017-02-19 MED ORDER — LACTATED RINGERS IV SOLN
INTRAVENOUS | Status: DC
  Administered 2017-02-19: 12:00:00 via INTRAVENOUS

## 2017-02-19 MED ORDER — MIDAZOLAM HCL 2 MG/2ML IJ SOLN
INTRAMUSCULAR | Status: AC
  Filled 2017-02-19: qty 2

## 2017-02-19 MED ORDER — MIDAZOLAM HCL 2 MG/2ML IJ SOLN
INTRAMUSCULAR | Status: DC | PRN
  Administered 2017-02-19: 2 mg via INTRAVENOUS

## 2017-02-19 NOTE — Op Note (Signed)
[facility] Patient Name: Roberta [name] Procedure Date: 02/19/2017 1:14 PM MRN: [id number] Account #: [id number] Date of Birth: 1945-09-03 Admit Type: Outpatient Age: 71 Room: [facility] PROCEDURE RM 02 Gender: Female Note Status: Finalized Attending MD: Ferd Hibbs , MD Procedure:         Bronchoscopy Indications:       Right upper lobe mass Providers:         Ferd Hibbs, MD Referring MD:       Medicines:         General Anesthesia Complications:     No immediate complications Procedure:         Pre-Anesthesia Assessment:                    - A History and Physical has been performed. The patient's                     medications, allergies and sensitivities have been                     reviewed.                    After obtaining informed consent, the bronchoscope was                     passed under direct vision. Throughout the procedure, the                     patient's blood pressure, pulse, and oxygen saturations                     were monitored continuously. the Bronchoscope Olympus                     [id number] was introduced through the mouth, via                     the endotracheal tube and advanced to the right lung only.                     The procedure was accomplished without difficulty. The                     patient tolerated the procedure fairly well. Findings:      Right Lung Abnormalities: A completely obstructing mass was found       proximally in the right upper lobe. The mass was large and bloody,       circumferential, endobronchial, exophytic, friable, fungating and       infiltrative. The lesion was not traversed.      Endobronchial biopsies of a mass were performed in the right upper lobe       using a forceps and sent for routine cytology. Five samples were       obtained.      Protected brushings of a mass were obtained in the right upper lobe with       a cytology brush and sent for routine cytology. Two  samples were       obtained. Impression:        - Right upper lobe mass                    - A bloody, circumferential, endobronchial, exophytic,  friable, fungating and infiltrative mass was found in the                     right upper lobe. This lesion is malignant.                    - No specimens collected. Recommendation:    - Await biopsy, brushing and cytology results. Patricia Pesa MD Ferd Hibbs, MD 02/19/2017 1:25:04 PM This report has been signed electronically. Number of Addenda: 0 Note Initiated On: 02/19/2017 1:14 PM      [facility]

## 2017-02-19 NOTE — Interval H&P Note (Signed)
History and Physical Interval Note:  02/19/2017 1:26 PM  Roberta [name]  has presented today for surgery, with the diagnosis of lung mass  The various methods of treatment have been discussed with the patient and family. After consideration of risks, benefits and other options for treatment, the patient has consented to  Procedure(s): ENDOBRONCHIAL ULTRASOUND (N/A) as a surgical intervention .  The patient's history has been reviewed, patient examined, no change in status, stable for surgery.  I have reviewed the patient's chart and labs.  Questions were answered to the patient's satisfaction.     Flora Lipps

## 2017-02-19 NOTE — H&P (View-Only) (Signed)
Name: Roberta [name] MRN: [id number] DOB: 11-Jun-1945     CONSULTATION DATE: 02/13/2017   REFERRING MD :  RAO  CHIEF COMPLAINT:  Coughing up blood  STUDIES:  CT chest 01/23/17 I have Independently reviewed images of CT chest   on 02/13/2017 Interpretation: Large right lung mass with extensive adenopathy    HISTORY OF PRESENT ILLNESS: 71 year old pleasant white female seen today for abnormal CT scan findings Patient has had coughing episodes with blood-tinged sputum for the past several months progressively worse over the last 1 month however the last 2 weeks has intermittent hemoptysis Patient has chronic shortness of breath and chronic dyspnea and exertion patient has a diagnosis of emphysema and COPD Patient denies any fevers chills nausea vomiting diarrhea constipation No weight loss noted Patient's appetite is good  patient smokes one pack a day for 50 years  Patient subsequently had a chest x-ray which showed some abnormal findings would lead which led to a CT scan of her chest Which revealed a very large right lung mass most likely consistent with primary lung cancer  Patient states she had left breast cancer in the past  I have reviewed the CT scan findings with the patient and the family and after further evaluation patient will need bronchoscopic evaluation and diagnosis for further care and management   The Risks and Benefits of the Bronchoscopy with EBUS/Bronchscopy were explained to patient/family and I have discussed the risk for acute bleeding, increased chance of infection, increased chance of respiratory failure, stroke, cardiac arrest and death. I have also explained to avoid all types of NSAIDs to decrease chance of bleeding, and to avoid food and drinks the midnight prior to procedure.  The procedure consists of a video camera with a light source to be placed and inserted  into the lungs to  look for abnormal tissue and to obtain tissue samples by using  needle and biopsy tools.  The patient/family understand the risks and benefits and have agreed to proceed with procedure.   PAST MEDICAL HISTORY :   has a past medical history of AAA (abdominal aortic aneurysm) ([place]); Breast cancer ([place]) (2002); COPD (chronic obstructive pulmonary disease) ([place]); High cholesterol; Personal history of chemotherapy; and Tobacco abuse.  has a past surgical history that includes Mastectomy (Left, 2002); Abdominal hysterectomy; and ENDOVASCULAR REPAIR/STENT GRAFT (N/A, 12/06/2016). Prior to Admission medications   Medication Sig Start Date End Date Taking? Authorizing Provider  albuterol (PROVENTIL HFA;VENTOLIN HFA) 108 (90 Base) MCG/ACT inhaler Inhale 2 puffs into the lungs every 6 (six) hours as needed for wheezing or shortness of breath. 03/23/16  Yes Frederich Cha, MD  aspirin EC 81 MG tablet Take 81 mg by mouth daily.   Yes [provider]  atorvastatin (LIPITOR) 10 MG tablet Take 10 mg by mouth at bedtime.  08/11/16  Yes [provider]  Fluticasone-Salmeterol (ADVAIR DISKUS) 250-50 MCG/DOSE AEPB Inhale 1 puff into the lungs 2 (two) times daily. 03/23/16  Yes Frederich Cha, MD  HYDROcodone-homatropine [facility]) 5-1.5 MG/5ML syrup Take 5 mLs by mouth every 6 (six) hours as needed for cough.   Yes [provider]  ipratropium (ATROVENT) 0.02 % nebulizer solution Take 0.5 mg by nebulization 4 (four) times daily.   Yes [provider]   No Known Allergies  FAMILY HISTORY:  family history is not on file. SOCIAL HISTORY:  reports that she has been smoking Cigarettes.  She has been smoking about 0.50 packs per day. She has never used smokeless tobacco.  She reports that she does not drink alcohol or use drugs.  REVIEW OF SYSTEMS:   Constitutional: Negative for fever, chills, weight loss, malaise/fatigue and diaphoresis.  HENT: Negative for hearing loss, ear pain, nosebleeds, congestion, sore throat, neck pain, tinnitus and ear  discharge.   Eyes: Negative for blurred vision, double vision, photophobia, pain, discharge and redness.  Respiratory: + cough, + hemoptysis, sputum production, + shortness of breath, + wheezing and stridor.   Cardiovascular: Negative for chest pain, palpitations, orthopnea, claudication, leg swelling and PND.  Gastrointestinal: Negative for heartburn, nausea, vomiting, abdominal pain, diarrhea, constipation, blood in stool and melena.  Genitourinary: Negative for dysuria, urgency, frequency, hematuria and flank pain.  Musculoskeletal: Negative for myalgias, back pain, joint pain and falls.  Skin: Negative for itching and rash.  Neurological: Negative for dizziness, tingling, tremors, sensory change, speech change, focal weakness, seizures, loss of consciousness, weakness and headaches.  Endo/Heme/Allergies: Negative for environmental allergies and polydipsia. Does not bruise/bleed easily.  ALL OTHER ROS ARE NEGATIVE    BP 128/70 (BP Location: Left Arm, Cuff Size: Normal)   Pulse 75   Ht 5' 4.5" (1.638 m)   Wt 146 lb (66.2 kg)   SpO2 95%   BMI 24.67 kg/m    Physical Examination:   GENERAL:NAD, no fevers, chills, no weakness no fatigue,  HEAD: Normocephalic, atraumatic.  EYES: Pupils equal, round, reactive to light. Extraocular muscles intact. No scleral icterus.  MOUTH: Moist mucosal membrane, poor dentition   EAR, NOSE, THROAT: Clear without exudates. No external lesions.  NECK: Supple. No thyromegaly. No nodules. No JVD.  PULMONARY:CTA B/L no wheezes, no crackles, no rhonchi CARDIOVASCULAR: S1 and S2. Regular rate and rhythm. No murmurs, rubs, or gallops. No edema.  GASTROINTESTINAL: Soft, nontender, nondistended. No masses. Positive bowel sounds.  MUSCULOSKELETAL: No swelling, clubbing, or edema. Range of motion full in all extremities.  NEUROLOGIC: Cranial nerves II through XII are intact. No gross focal neurological deficits.  SKIN: No ulceration, lesions, rashes, or  cyanosis. Skin warm and dry. Turgor intact.  PSYCHIATRIC: Mood, affect within normal limits. The patient is awake, alert and oriented x 3. Insight, judgment intact.      ASSESSMENT / PLAN: 71 year old pleasant white female seen today for hemoptysis secondary to abnormal CT chest findings of a very large right lung mass consistent with primary lung cancer in the setting of extensive smoking history and tobacco abuse as well as deconditioned state   I have discussed the next step of management which is to undergo bronchoscopy for airway examination and tissue sampling to confirm diagnosis, and at this time patient understands the risks and benefits and would like to proceed with bronchoscopy  Patient/Family are satisfied with Plan of action and management. All questions answered  Corrin Parker, M.D.  [facility]  Medical Director [place] Director [facility] Cardio-Pulmonary Department

## 2017-02-19 NOTE — Anesthesia Preprocedure Evaluation (Signed)
Anesthesia Evaluation  Patient identified by MRN, date of birth, ID band Patient awake    Reviewed: Allergy & Precautions, H&P , NPO status , Patient's Chart, lab work & pertinent test results, reviewed documented beta blocker date and time   Airway Mallampati: II  TM Distance: >3 FB Neck ROM: full    Dental  (+) Teeth Intact   Pulmonary shortness of breath and with exertion, COPD,  COPD inhaler, Current Smoker,    Pulmonary exam normal        Cardiovascular Exercise Tolerance: Poor + Peripheral Vascular Disease and + DOE  negative cardio ROS Normal cardiovascular exam Rhythm:regular Rate:Normal     Neuro/Psych negative neurological ROS  negative psych ROS   GI/Hepatic negative GI ROS, Neg liver ROS,   Endo/Other  negative endocrine ROS  Renal/GU negative Renal ROS  negative genitourinary   Musculoskeletal   Abdominal   Peds  Hematology negative hematology ROS (+)   Anesthesia Other Findings Past Medical History: No date: AAA (abdominal aortic aneurysm) ([place]) 2002: Breast cancer ([place])     Comment:  left No date: COPD (chronic obstructive pulmonary disease) ([facility]) No date: High cholesterol No date: Personal history of chemotherapy No date: Tobacco abuse Past Surgical History: No date: ABDOMINAL HYSTERECTOMY 12/06/2016: ENDOVASCULAR REPAIR/STENT GRAFT; N/A     Comment:  Procedure: Endovascular Repair/Stent Graft;  Surgeon:               Katha Cabal, MD;  Location: [place] [facility];               Service: Cardiovascular;  Laterality: N/A; 2002: MASTECTOMY; Left     Comment:  with sentinel node    Reproductive/Obstetrics negative OB ROS                             Anesthesia Physical Anesthesia Plan  ASA: III  Anesthesia Plan: General ETT   Post-op Pain Management:    Induction:   PONV Risk Score and Plan: 1 and Ondansetron and Dexamethasone  Airway Management  Planned:   Additional Equipment:   Intra-op Plan:   Post-operative Plan:   Informed Consent: I have reviewed the patients History and Physical, chart, labs and discussed the procedure including the risks, benefits and alternatives for the proposed anesthesia with the patient or authorized representative who has indicated his/her understanding and acceptance.   Dental Advisory Given  Plan Discussed with: CRNA  Anesthesia Plan Comments: (Pt at high risk for post op ventilatory support.  Pt accepts and is aware.  JA)        Anesthesia Quick Evaluation

## 2017-02-19 NOTE — Progress Notes (Signed)
Pt is complaining with a headache  No pain associated with the surgery

## 2017-02-19 NOTE — Anesthesia Procedure Notes (Signed)
Procedure Name: Intubation Performed by: Rolla Plate Pre-anesthesia Checklist: Patient identified, Patient being monitored, Timeout performed, Emergency Drugs available and Suction available Patient Re-evaluated:Patient Re-evaluated prior to induction Oxygen Delivery Method: Circle system utilized Preoxygenation: Pre-oxygenation with 100% oxygen Induction Type: IV induction Ventilation: Mask ventilation without difficulty Laryngoscope Size: Miller and 2 Grade View: Grade I Tube type: Oral Tube size: 8.5 mm Number of attempts: 1 Airway Equipment and Method: Stylet Placement Confirmation: ETT inserted through vocal cords under direct vision,  positive ETCO2 and breath sounds checked- equal and bilateral Secured at: 21 cm Tube secured with: Tape Dental Injury: Teeth and Oropharynx as per pre-operative assessment

## 2017-02-19 NOTE — Progress Notes (Signed)
States headache is better

## 2017-02-19 NOTE — Anesthesia Post-op Follow-up Note (Signed)
Anesthesia QCDR form completed.        

## 2017-02-19 NOTE — Transfer of Care (Signed)
Immediate Anesthesia Transfer of Care Note  Patient: Roberta [name]  Procedure(s) Performed: Procedure(s): ENDOBRONCHIAL ULTRASOUND (N/A)  Patient Location: PACU  Anesthesia Type:General  Level of Consciousness: awake  Airway & Oxygen Therapy: Patient Spontanous Breathing and Patient connected to face mask oxygen  Post-op Assessment: Report given to RN and Post -op Vital signs reviewed and stable  Post vital signs: Reviewed  Last Vitals:  Vitals:   02/19/17 1333 02/19/17 1334  BP: (P) 103/63 103/63  Pulse: (P) 81 81  Resp: (P) 19 15  Temp: (P) 36.6 C   SpO2: (P) 98% 100%    Last Pain:  Vitals:   02/19/17 1153  TempSrc: Tympanic         Complications: No apparent anesthesia complications

## 2017-02-20 ENCOUNTER — Encounter: Payer: Self-pay | Admitting: Internal Medicine

## 2017-02-20 ENCOUNTER — Other Ambulatory Visit: Payer: Self-pay | Admitting: Pathology

## 2017-02-21 ENCOUNTER — Telehealth: Payer: Self-pay | Admitting: *Deleted

## 2017-02-21 ENCOUNTER — Ambulatory Visit
Admission: RE | Admit: 2017-02-21 | Discharge: 2017-02-21 | Disposition: A | Discharge status: Home / Self Care | Payer: Medicare Other | Source: Ambulatory Visit | Attending: Oncology | Admitting: Oncology

## 2017-02-21 ENCOUNTER — Telehealth: Payer: Self-pay | Admitting: Oncology

## 2017-02-21 DIAGNOSIS — R911 Solitary pulmonary nodule: Secondary | ICD-10-CM | POA: Diagnosis not present

## 2017-02-21 LAB — CYTOLOGY - NON PAP

## 2017-02-21 LAB — SURGICAL PATHOLOGY

## 2017-02-21 LAB — POCT I-STAT CREATININE: Creatinine, Ser: 1 mg/dL (ref 0.44–1.00)

## 2017-02-21 MED ORDER — GADOBENATE DIMEGLUMINE 529 MG/ML IV SOLN
15.0000 mL | Freq: Once | INTRAVENOUS | Status: AC | PRN
  Administered 2017-02-21: 13 mL via INTRAVENOUS

## 2017-02-21 NOTE — Telephone Encounter (Signed)
Per Sherry/schd msh 02/21/17, rschd from 10/02 to 09/27. Patient is aware. MF

## 2017-02-21 NOTE — Telephone Encounter (Signed)
I called pt to ask her if she would like to come to cancer center tom. Because we have pathology results.  She is agreeable.  She will needs labs 2 pm and then see md 2:15 and after that will see Dr. Baruch Gouty and she is agreeable to all.

## 2017-02-21 NOTE — Telephone Encounter (Signed)
Pt returning call about a result. Informed front staff we did not call patient. She may want to try Dr. Elroy Channel office.

## 2017-02-22 ENCOUNTER — Encounter: Payer: Self-pay | Admitting: Oncology

## 2017-02-22 ENCOUNTER — Inpatient Hospital Stay (HOSPITAL_BASED_OUTPATIENT_CLINIC_OR_DEPARTMENT_OTHER): Payer: Medicare Other | Admitting: Oncology

## 2017-02-22 ENCOUNTER — Ambulatory Visit
Admission: RE | Admit: 2017-02-22 | Discharge: 2017-02-22 | Disposition: A | Discharge status: Home / Self Care | Payer: Medicare Other | Source: Ambulatory Visit | Attending: Radiation Oncology | Admitting: Radiation Oncology

## 2017-02-22 ENCOUNTER — Inpatient Hospital Stay: Payer: Medicare Other

## 2017-02-22 VITALS — BP 153/79 | HR 61 | Temp 87.8°F | Resp 16 | Wt 143.3 lb

## 2017-02-22 DIAGNOSIS — E7800 Pure hypercholesterolemia, unspecified: Secondary | ICD-10-CM | POA: Insufficient documentation

## 2017-02-22 DIAGNOSIS — Z7982 Long term (current) use of aspirin: Secondary | ICD-10-CM | POA: Insufficient documentation

## 2017-02-22 DIAGNOSIS — Z853 Personal history of malignant neoplasm of breast: Secondary | ICD-10-CM | POA: Insufficient documentation

## 2017-02-22 DIAGNOSIS — Z510 Encounter for antineoplastic radiation therapy: Secondary | ICD-10-CM | POA: Insufficient documentation

## 2017-02-22 DIAGNOSIS — Z7189 Other specified counseling: Secondary | ICD-10-CM

## 2017-02-22 DIAGNOSIS — F17210 Nicotine dependence, cigarettes, uncomplicated: Secondary | ICD-10-CM | POA: Diagnosis not present

## 2017-02-22 DIAGNOSIS — Z79899 Other long term (current) drug therapy: Secondary | ICD-10-CM | POA: Insufficient documentation

## 2017-02-22 DIAGNOSIS — Z23 Encounter for immunization: Secondary | ICD-10-CM | POA: Diagnosis not present

## 2017-02-22 DIAGNOSIS — C3411 Malignant neoplasm of upper lobe, right bronchus or lung: Secondary | ICD-10-CM | POA: Diagnosis not present

## 2017-02-22 DIAGNOSIS — Z9221 Personal history of antineoplastic chemotherapy: Secondary | ICD-10-CM | POA: Insufficient documentation

## 2017-02-22 DIAGNOSIS — C3401 Malignant neoplasm of right main bronchus: Secondary | ICD-10-CM | POA: Insufficient documentation

## 2017-02-22 DIAGNOSIS — J449 Chronic obstructive pulmonary disease, unspecified: Secondary | ICD-10-CM | POA: Insufficient documentation

## 2017-02-22 DIAGNOSIS — R918 Other nonspecific abnormal finding of lung field: Secondary | ICD-10-CM

## 2017-02-22 DIAGNOSIS — I714 Abdominal aortic aneurysm, without rupture: Secondary | ICD-10-CM | POA: Insufficient documentation

## 2017-02-22 LAB — COMPREHENSIVE METABOLIC PANEL
ALBUMIN: 3.5 g/dL (ref 3.5–5.0)
ALK PHOS: 82 U/L (ref 38–126)
ALT: 14 U/L (ref 14–54)
AST: 23 U/L (ref 15–41)
Anion gap: 6 (ref 5–15)
BUN: 15 mg/dL (ref 6–20)
CALCIUM: 9.1 mg/dL (ref 8.9–10.3)
CHLORIDE: 102 mmol/L (ref 101–111)
CO2: 30 mmol/L (ref 22–32)
CREATININE: 0.92 mg/dL (ref 0.44–1.00)
GFR calc non Af Amer: >60 mL/min (ref >60)
GLUCOSE: 112 mg/dL — AB (ref 65–99)
Potassium: 4 mmol/L (ref 3.5–5.1)
SODIUM: 138 mmol/L (ref 135–145)
Total Bilirubin: 0.3 mg/dL (ref 0.3–1.2)
Total Protein: 6.9 g/dL (ref 6.5–8.1)

## 2017-02-22 LAB — CBC WITH DIFFERENTIAL/PLATELET
BASOS ABS: 0.1 10*3/uL (ref 0–0.1)
Basophils Relative: 1 %
EOS ABS: 0 10*3/uL (ref 0–0.7)
Eosinophils Relative: 0 %
HCT: 39.6 % (ref 35.0–47.0)
HEMOGLOBIN: 13.3 g/dL (ref 12.0–16.0)
LYMPHS ABS: 2.9 10*3/uL (ref 1.0–3.6)
Lymphocytes Relative: 30 %
MCH: 29.2 pg (ref 26.0–34.0)
MCHC: 33.5 g/dL (ref 32.0–36.0)
MCV: 87.2 fL (ref 80.0–100.0)
Monocytes Absolute: 0.6 10*3/uL (ref 0.2–0.9)
Monocytes Relative: 6 %
NEUTROS PCT: 63 %
Neutro Abs: 6.1 10*3/uL (ref 1.4–6.5)
Platelets: 362 10*3/uL (ref 150–440)
RBC: 4.54 MIL/uL (ref 3.80–5.20)
RDW: 15.4 % — ABNORMAL HIGH (ref 11.5–14.5)
WBC: 9.7 10*3/uL (ref 3.6–11.0)

## 2017-02-22 MED ORDER — INFLUENZA VAC SPLIT QUAD 0.5 ML IM SUSY
0.5000 mL | PREFILLED_SYRINGE | Freq: Once | INTRAMUSCULAR | Status: AC
  Administered 2017-02-22: 0.5 mL via INTRAMUSCULAR

## 2017-02-22 NOTE — Anesthesia Postprocedure Evaluation (Signed)
Anesthesia Post Note  Patient: Roberta [name]  Procedure(s) Performed: Procedure(s) (LRB): ENDOBRONCHIAL ULTRASOUND (N/A)  Patient location during evaluation: PACU Anesthesia Type: General Level of consciousness: awake and alert Pain management: pain level controlled Vital Signs Assessment: post-procedure vital signs reviewed and stable Respiratory status: spontaneous breathing, nonlabored ventilation, respiratory function stable and patient connected to nasal cannula oxygen Cardiovascular status: blood pressure returned to baseline and stable Postop Assessment: no apparent nausea or vomiting Anesthetic complications: no     Last Vitals:  Vitals:   02/19/17 1412 02/19/17 1440  BP: 130/64 123/73  Pulse: 86 71  Resp: 20   Temp: (!) 35.9 C   SpO2: 100% 100%    Last Pain:  Vitals:   02/20/17 0810  TempSrc:   PainSc: 0-No pain                 Molli Barrows

## 2017-02-22 NOTE — Progress Notes (Signed)
Patient and family are here today to get results from her recent lung biopsy. Patient does want the flu vaccine today.

## 2017-02-23 ENCOUNTER — Ambulatory Visit: Payer: Medicare Other

## 2017-02-23 ENCOUNTER — Other Ambulatory Visit: Payer: Self-pay | Admitting: *Deleted

## 2017-02-23 DIAGNOSIS — C3490 Malignant neoplasm of unspecified part of unspecified bronchus or lung: Secondary | ICD-10-CM

## 2017-02-23 DIAGNOSIS — Z7189 Other specified counseling: Secondary | ICD-10-CM | POA: Insufficient documentation

## 2017-02-23 DIAGNOSIS — C3411 Malignant neoplasm of upper lobe, right bronchus or lung: Secondary | ICD-10-CM | POA: Insufficient documentation

## 2017-02-23 MED ORDER — LORAZEPAM 0.5 MG PO TABS: 0.5000 mg | ORAL_TABLET | Freq: Four times a day (QID) | ORAL | 0 refills | Status: DC | PRN

## 2017-02-23 MED ORDER — LIDOCAINE-PRILOCAINE 2.5-2.5 % EX CREA: TOPICAL_CREAM | CUTANEOUS | 3 refills | Status: DC

## 2017-02-23 MED ORDER — DEXAMETHASONE 4 MG PO TABS: ORAL_TABLET | ORAL | 1 refills | Status: DC

## 2017-02-23 MED ORDER — ONDANSETRON HCL 8 MG PO TABS: 8.0000 mg | ORAL_TABLET | Freq: Two times a day (BID) | ORAL | 1 refills | Status: DC | PRN

## 2017-02-23 MED ORDER — PROCHLORPERAZINE MALEATE 10 MG PO TABS: 10.0000 mg | ORAL_TABLET | Freq: Four times a day (QID) | ORAL | 1 refills | Status: DC | PRN

## 2017-02-23 NOTE — Consult Note (Signed)
NEW PATIENT EVALUATION  Name: Roberta [name]  MRN: [id number]  Date:   02/22/2017     DOB: [birth year]   This 71 y.o. 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   DIAGNOSIS: There were no encounter diagnoses.   PREVIOUS INVESTIGATIONS:  PET CT and CT scans reviewed Pathology report reviewed Clinical notes reviewed  HPI: patient is a 71 year old female with greater than 50-pack-year smoking history who presented with increasing cough hemoptysis and weight loss. CT scan performed August 28 showed a large right suprahilar mass extending into the mediastinum consistent with primary lung carcinoma.this area was hypermetabolic on PET CT scan which showed no evidence of disease outside the chest.MRI of brain was unremarkable for metastatic disease.patient underwent bronchoscopy with biopsy proven small cell carcinoma TTS-1 negative CD 56 positive.she is seen today for radiation oncology opinion. She's been met with medical oncology who is recommending concurrent chemoradiation and depending on response possible PCI. Patient is doing fairly well she continues to have a cough and slight hemoptysis. She is having no pain at this time.  PLANNED TREATMENT REGIMEN: concurrent chemoradiation  PAST MEDICAL HISTORY:  has a past medical history of AAA (abdominal aortic aneurysm) ([place]); Breast cancer ([place]) (2002); COPD (chronic obstructive pulmonary disease) ([place]); High cholesterol; Personal history of chemotherapy; and Tobacco abuse.    PAST SURGICAL HISTORY:  Past Surgical History:  Procedure Laterality Date  . ABDOMINAL HYSTERECTOMY    . ENDOBRONCHIAL ULTRASOUND N/A 02/19/2017   Procedure: ENDOBRONCHIAL ULTRASOUND;  Surgeon: Flora Lipps, MD;  Location: [facility];  Service: Cardiopulmonary;   Laterality: N/A;  . ENDOVASCULAR REPAIR/STENT GRAFT N/A 12/06/2016   Procedure: Endovascular Repair/Stent Graft;  Surgeon: Katha Cabal, MD;  Location: [place] [facility];  Service: Cardiovascular;  Laterality: N/A;  . MASTECTOMY Left 2002   with sentinel node     FAMILY HISTORY: family history is not on file.  SOCIAL HISTORY:  reports that she has been smoking Cigarettes.  She has been smoking about 0.50 packs per day. She has never used smokeless tobacco. She reports that she does not drink alcohol or use drugs.  ALLERGIES: Patient has no known allergies.  MEDICATIONS:  Current Outpatient Prescriptions  Medication Sig Dispense Refill  . acetaminophen (TYLENOL) 500 MG tablet Take 1,000 mg by mouth every 6 (six) hours as needed (for pain.).    Marland Kitchen albuterol (PROVENTIL HFA;VENTOLIN HFA) 108 (90 Base) MCG/ACT inhaler Inhale 2 puffs into the lungs every 6 (six) hours as needed for wheezing or shortness of breath. 1 Inhaler 1  . aspirin EC 81 MG tablet Take 81 mg by mouth daily.    Marland Kitchen atorvastatin (LIPITOR) 10 MG tablet Take 10 mg by mouth at bedtime.     . fluticasone-salmeterol (ADVAIR HFA) 115-21 MCG/ACT inhaler Inhale 2 puffs into the lungs 2 (two) times daily.    Marland Kitchen HYDROcodone-homatropine (HYCODAN) 5-1.5 MG/5ML syrup Take 5 mLs by mouth every 6 (six) hours as needed for cough.    Marland Kitchen ipratropium (ATROVENT) 0.02 % nebulizer solution Take 0.5 mg by nebulization 4 (four) times daily.     No current facility-administered medications for this encounter.     ECOG PERFORMANCE STATUS:  1 - Symptomatic but completely ambulatory  REVIEW OF SYSTEMS: except for the hemoptysis and chronic cough and slight weight loss Patient denies any weight  loss, fatigue, weakness, fever, chills or night sweats. Patient denies any loss of vision, blurred vision. Patient denies any ringing  of the ears or hearing loss. No irregular heartbeat. Patient denies heart murmur or history of fainting. Patient denies any  chest pain or pain radiating to her upper extremities. Patient denies any shortness of breath, difficulty breathing at night, cough or hemoptysis. Patient denies any swelling in the lower legs. Patient denies any nausea vomiting, vomiting of blood, or coffee ground material in the vomitus. Patient denies any stomach pain. Patient states has had normal bowel movements no significant constipation or diarrhea. Patient denies any dysuria, hematuria or significant nocturia. Patient denies any problems walking, swelling in the joints or loss of balance. Patient denies any skin changes, loss of hair or loss of weight. Patient denies any excessive worrying or anxiety or significant depression. Patient denies any problems with insomnia. Patient denies excessive thirst, polyuria, polydipsia. Patient denies any swollen glands, patient denies easy bruising or easy bleeding. Patient denies any recent infections, allergies or URI. Patient "s visual fields have not changed significantly in recent time.    PHYSICAL EXAM: There were no vitals taken for this visit. Thin slightly cachectic female in NAD. Well-developed well-nourished patient in NAD. HEENT reveals PERLA, EOMI, discs not visualized.  Oral cavity is clear. No oral mucosal lesions are identified. Neck is clear without evidence of cervical or supraclavicular adenopathy. Lungs are clear to A&P. Cardiac examination is essentially unremarkable with regular rate and rhythm without murmur rub or thrill. Abdomen is benign with no organomegaly or masses noted. Motor sensory and DTR levels are equal and symmetric in the upper and lower extremities. Cranial nerves II through XII are grossly intact. Proprioception is intact. No peripheral adenopathy or edema is identified. No motor or sensory levels are noted. Crude visual fields are within normal range.  LABORATORY DATA: cytology and pathology reports reviewed    RADIOLOGY RESULTS:PET CT scan CT scans and MRI of brain  reviewed   IMPRESSION: limited stage small cell lung cancerof the right lung in 71 year old female for concurrent chemoradiationstage III disease  PLAN: at this time I to go ahead with concurrent chemoradiation. I would plan on delivering 6000 cGy over 6 weeks using IM RT radiation therapy. I would use I MRT to spare critical structures such as her heart esophagus normal lung volume and spinal cord. Risks and benefits of treatment including increasing possible cough fatigue alteration of blood counts possible radiation esophagitis all were discussed in detail with the patient and her family. I personally set up and ordered CT simulation for early next week.There will be extra effort by both professional staff as well as technical staff to coordinate and manage concurrent chemoradiation and ensuing side effects during R treatments.patient also depending on response will be candidate for PCI radiation after completion of chemotherapy and that was discussed with the patient also.  I would like to take this opportunity to thank you for allowing me to participate in the care of your patient.Armstead Peaks., MD

## 2017-02-23 NOTE — Patient Instructions (Signed)
Cisplatin injection  What is this medicine?  CISPLATIN (SIS pla tin) is a chemotherapy drug. It targets fast dividing cells, like cancer cells, and causes these cells to die. This medicine is used to treat many types of cancer like bladder, ovarian, and testicular cancers.  This medicine may be used for other purposes; ask your health care provider or pharmacist if you have questions.  COMMON BRAND NAME(S): Platinol, Platinol -AQ  What should I tell my health care provider before I take this medicine?  They need to know if you have any of these conditions:  -blood disorders  -hearing problems  -kidney disease  -recent or ongoing radiation therapy  -an unusual or allergic reaction to cisplatin, carboplatin, other chemotherapy, other medicines, foods, dyes, or preservatives  -pregnant or trying to get pregnant  -breast-feeding  How should I use this medicine?  This drug is given as an infusion into a vein. It is administered in a hospital or clinic by a specially trained health care professional.  Talk to your pediatrician regarding the use of this medicine in children. Special care may be needed.  Overdosage: If you think you have taken too much of this medicine contact a poison control center or emergency room at once.  NOTE: This medicine is only for you. Do not share this medicine with others.  What if I miss a dose?  It is important not to miss a dose. Call your doctor or health care professional if you are unable to keep an appointment.  What may interact with this medicine?  -dofetilide  -foscarnet  -medicines for seizures  -medicines to increase blood counts like filgrastim, pegfilgrastim, sargramostim  -probenecid  -pyridoxine used with altretamine  -rituximab  -some antibiotics like amikacin, gentamicin, neomycin, polymyxin B, streptomycin, tobramycin  -sulfinpyrazone  -vaccines  -zalcitabine  Talk to your doctor or health care professional before taking any of these  medicines:  -acetaminophen  -aspirin  -ibuprofen  -ketoprofen  -naproxen  This list may not describe all possible interactions. Give your health care provider a list of all the medicines, herbs, non-prescription drugs, or dietary supplements you use. Also tell them if you smoke, drink alcohol, or use illegal drugs. Some items may interact with your medicine.  What should I watch for while using this medicine?  Your condition will be monitored carefully while you are receiving this medicine. You will need important blood work done while you are taking this medicine.  This drug may make you feel generally unwell. This is not uncommon, as chemotherapy can affect healthy cells as well as cancer cells. Report any side effects. Continue your course of treatment even though you feel ill unless your doctor tells you to stop.  In some cases, you may be given additional medicines to help with side effects. Follow all directions for their use.  Call your doctor or health care professional for advice if you get a fever, chills or sore throat, or other symptoms of a cold or flu. Do not treat yourself. This drug decreases your body's ability to fight infections. Try to avoid being around people who are sick.  This medicine may increase your risk to bruise or bleed. Call your doctor or health care professional if you notice any unusual bleeding.  Be careful brushing and flossing your teeth or using a toothpick because you may get an infection or bleed more easily. If you have any dental work done, tell your dentist you are receiving this medicine.  Avoid taking products   that contain aspirin, acetaminophen, ibuprofen, naproxen, or ketoprofen unless instructed by your doctor. These medicines may hide a fever.  Do not become pregnant while taking this medicine. Women should inform their doctor if they wish to become pregnant or think they might be pregnant. There is a potential for serious side effects to an unborn child. Talk to  your health care professional or pharmacist for more information. Do not breast-feed an infant while taking this medicine.  Drink fluids as directed while you are taking this medicine. This will help protect your kidneys.  Call your doctor or health care professional if you get diarrhea. Do not treat yourself.  What side effects may I notice from receiving this medicine?  Side effects that you should report to your doctor or health care professional as soon as possible:  -allergic reactions like skin rash, itching or hives, swelling of the face, lips, or tongue  -signs of infection - fever or chills, cough, sore throat, pain or difficulty passing urine  -signs of decreased platelets or bleeding - bruising, pinpoint red spots on the skin, black, tarry stools, nosebleeds  -signs of decreased red blood cells - unusually weak or tired, fainting spells, lightheadedness  -breathing problems  -changes in hearing  -gout pain  -low blood counts - This drug may decrease the number of white blood cells, red blood cells and platelets. You may be at increased risk for infections and bleeding.  -nausea and vomiting  -pain, swelling, redness or irritation at the injection site  -pain, tingling, numbness in the hands or feet  -problems with balance, movement  -trouble passing urine or change in the amount of urine  Side effects that usually do not require medical attention (report to your doctor or health care professional if they continue or are bothersome):  -changes in vision  -loss of appetite  -metallic taste in the mouth or changes in taste  This list may not describe all possible side effects. Call your doctor for medical advice about side effects. You may report side effects to FDA at 1-800-FDA-1088.  Where should I keep my medicine?  This drug is given in a hospital or clinic and will not be stored at home.  NOTE: This sheet is a summary. It may not cover all possible information. If you have questions about this medicine,  talk to your doctor, pharmacist, or health care provider.   2018 [employer] Standard (2007-08-20 14:40:54)  Etoposide, VP-16 injection  What is this medicine?  ETOPOSIDE, VP-16 (e toe POE side) is a chemotherapy drug. It is used to treat testicular cancer, lung cancer, and other cancers.  This medicine may be used for other purposes; ask your health care provider or pharmacist if you have questions.  COMMON BRAND NAME(S): Etopophos, Toposar, VePesid  What should I tell my health care provider before I take this medicine?  They need to know if you have any of these conditions:  -infection  -kidney disease  -liver disease  -low blood counts, like low white cell, platelet, or red cell counts  -an unusual or allergic reaction to etoposide, other medicines, foods, dyes, or preservatives  -pregnant or trying to get pregnant  -breast-feeding  How should I use this medicine?  This medicine is for infusion into a vein. It is administered in a hospital or clinic by a specially trained health care professional.  Talk to your pediatrician regarding the use of this medicine in children. Special care may be needed.  Overdosage: If you think   you have taken too much of this medicine contact a poison control center or emergency room at once.  NOTE: This medicine is only for you. Do not share this medicine with others.  What if I miss a dose?  It is important not to miss your dose. Call your doctor or health care professional if you are unable to keep an appointment.  What may interact with this medicine?  -aspirin  -certain medications for seizures like carbamazepine, phenobarbital, phenytoin, valproic acid  -cyclosporine  -levamisole  -warfarin  This list may not describe all possible interactions. Give your health care provider a list of all the medicines, herbs, non-prescription drugs, or dietary supplements you use. Also tell them if you smoke, drink alcohol, or use illegal drugs. Some items may interact with your  medicine.  What should I watch for while using this medicine?  Visit your doctor for checks on your progress. This drug may make you feel generally unwell. This is not uncommon, as chemotherapy can affect healthy cells as well as cancer cells. Report any side effects. Continue your course of treatment even though you feel ill unless your doctor tells you to stop.  In some cases, you may be given additional medicines to help with side effects. Follow all directions for their use.  Call your doctor or health care professional for advice if you get a fever, chills or sore throat, or other symptoms of a cold or flu. Do not treat yourself. This drug decreases your body's ability to fight infections. Try to avoid being around people who are sick.  This medicine may increase your risk to bruise or bleed. Call your doctor or health care professional if you notice any unusual bleeding.  Talk to your doctor about your risk of cancer. You may be more at risk for certain types of cancers if you take this medicine.  Do not become pregnant while taking this medicine or for at least 6 months after stopping it. Women should inform their doctor if they wish to become pregnant or think they might be pregnant. Women of child-bearing potential will need to have a negative pregnancy test before starting this medicine. There is a potential for serious side effects to an unborn child. Talk to your health care professional or pharmacist for more information. Do not breast-feed an infant while taking this medicine.  Men must use a latex condom during sexual contact with a woman while taking this medicine and for at least 4 months after stopping it. A latex condom is needed even if you have had a vasectomy. Contact your doctor right away if your partner becomes pregnant. Do not donate sperm while taking this medicine and for at least 4 months after you stop taking this medicine. Men should inform their doctors if they wish to father a child.  This medicine may lower sperm counts.  What side effects may I notice from receiving this medicine?  Side effects that you should report to your doctor or health care professional as soon as possible:  -allergic reactions like skin rash, itching or hives, swelling of the face, lips, or tongue  -low blood counts - this medicine may decrease the number of white blood cells, red blood cells and platelets. You may be at increased risk for infections and bleeding.  -signs of infection - fever or chills, cough, sore throat, pain or difficulty passing urine  -signs of decreased platelets or bleeding - bruising, pinpoint red spots on the skin, black, tarry stools,   blood in the urine  -signs of decreased red blood cells - unusually weak or tired, fainting spells, lightheadedness  -breathing problems  -changes in vision  -mouth or throat sores or ulcers  -pain, redness, swelling or irritation at the injection site  -pain, tingling, numbness in the hands or feet  -redness, blistering, peeling or loosening of the skin, including inside the mouth  -seizures  -vomiting  Side effects that usually do not require medical attention (report to your doctor or health care professional if they continue or are bothersome):  -diarrhea  -hair loss  -loss of appetite  -nausea  -stomach pain  This list may not describe all possible side effects. Call your doctor for medical advice about side effects. You may report side effects to FDA at 1-800-FDA-1088.  Where should I keep my medicine?  This drug is given in a hospital or clinic and will not be stored at home.  NOTE: This sheet is a summary. It may not cover all possible information. If you have questions about this medicine, talk to your doctor, pharmacist, or health care provider.   2018 [employer] Standard (2015-05-07 11:53:23)

## 2017-02-23 NOTE — Progress Notes (Signed)
START ON PATHWAY REGIMEN - Small Cell Lung     A cycle is every 21 days:     Etoposide      Cisplatin   **Always confirm dose/schedule in your pharmacy ordering system**    Patient Characteristics: Limited Stage, First Line Stage Classification: Limited AJCC T Category: T3 AJCC N Category: N2 AJCC M Category: M0 AJCC 8 Stage Grouping: IIIB Line of therapy: First Line Would you be surprised if this patient died  in the next year<= I would NOT be surprised if this patient died in the next year Intent of Therapy: Curative Intent, Discussed with Patient

## 2017-02-23 NOTE — Progress Notes (Signed)
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 Patient is a 71 year old female with a long-standing history of smoking and smoked 1 pack per day for more than 50 years and is now down to half a pack per day. She also has COPD and recently had abdominal aortic aneurysm status post stenting. She presented with symptoms of mild hemoptysis in August 2018 which prompted CT chest  2. CT chest on 01/23/2017 showed:IMPRESSION: 1. Large right suprahilar mass extending into the mediastinum consistent with primary lung carcinoma with mediastinal extension. 2. Partial atelectasis of right upper lobe due to this large suprahilar mass with probable postobstructive pneumonitis in the right upper lobe peripherally. 3. Small right pleural effusion. 4. Severe thoracic aortic atherosclerosis. Diffuse coronary artery Calcifications.   3. This was followed by a PET CT scan on 02/07/2017 showed:IMPRESSION: 1. Hypermetabolic disease in the upper right hilum and mediastinum compatible with neoplasm. 2. Mild FDG accumulation in peripheral areas of patchy airspace disease which may be secondary sites of neoplasm or related to infection/inflammation. 3. No evidence for distant metastases in the neck, abdomen, or pelvis.   4. Patient underwent bronchoscopy guided biopsy with Dr. Mortimer Fries. Pathology showed small cell lung cancer. MRI brain negative for metastatic disease  5. Patient lives with her sister and is independent of her ADLs. She reports  occasional blood-tinged sputum but no significant frank hemoptysis.  Her appetite is good and she denies any unintentional weight loss. She denies any pain. She does have a history of breast cancer in 2002 status post left mastectomy followed by adjuvant chemotherapy. This was treated by Dr. Oliva Bustard. She did not receive any adjuvant radiation.   Interval history- patient is here with her entire family including friends and daughter and son. She reports doing well and denies any loss of appetite or unintentional weight loss. Denies any recent episodes of hemoptysis.  ECOG PS- 1 Pain scale- 0   Review of systems- Review of Systems  Constitutional: Negative for chills, fever, malaise/fatigue and weight loss.  HENT: Negative for congestion, ear discharge and nosebleeds.   Eyes: Negative for blurred vision.  Respiratory: Negative for cough, hemoptysis, sputum production, shortness of breath and wheezing.   Cardiovascular: Negative for chest pain, palpitations, orthopnea and claudication.  Gastrointestinal: Negative for abdominal pain, blood in stool, constipation, diarrhea, heartburn, melena, nausea and vomiting.  Genitourinary: Negative for dysuria, flank pain, frequency, hematuria and urgency.  Musculoskeletal: Negative for back pain, joint pain and myalgias.  Skin: Negative for rash.  Neurological: Negative for dizziness, tingling, focal weakness, seizures, weakness and headaches.  Endo/Heme/Allergies: Does not bruise/bleed easily.  Psychiatric/Behavioral: Negative for depression and suicidal ideas. The patient does not have insomnia.       No Known Allergies   Past Medical History:  Diagnosis Date  . AAA (abdominal aortic aneurysm) ([place])   . Breast cancer ([place]) 2002   left  . COPD (chronic obstructive pulmonary disease) ([place])   . High cholesterol   . Personal history of chemotherapy   . Tobacco abuse      Past Surgical History:  Procedure  Laterality Date  . ABDOMINAL  HYSTERECTOMY    . ENDOBRONCHIAL ULTRASOUND N/A 02/19/2017   Procedure: ENDOBRONCHIAL ULTRASOUND;  Surgeon: Flora Lipps, MD;  Location: [facility] ORS;  Service: Cardiopulmonary;  Laterality: N/A;  . ENDOVASCULAR REPAIR/STENT GRAFT N/A 12/06/2016   Procedure: Endovascular Repair/Stent Graft;  Surgeon: Katha Cabal, MD;  Location: [place] [facility];  Service: Cardiovascular;  Laterality: N/A;  . MASTECTOMY Left 2002   with sentinel node     Social History   Social History  . Marital status: Widowed    Spouse name: N/A  . Number of children: N/A  . Years of education: N/A   Occupational History  . Not on file.   Social History Main Topics  . Smoking status: Current Every Day Smoker    Packs/day: 0.50    Types: Cigarettes  . Smokeless tobacco: Never Used  . Alcohol use No  . Drug use: No  . Sexual activity: No   Other Topics Concern  . Not on file   Social History Narrative  . No narrative on file    Family History  Problem Relation Age of Onset  . Breast cancer Neg Hx      Current Outpatient Prescriptions:  .  acetaminophen (TYLENOL) 500 MG tablet, Take 1,000 mg by mouth every 6 (six) hours as needed (for pain.)., Disp: , Rfl:  .  albuterol (PROVENTIL HFA;VENTOLIN HFA) 108 (90 Base) MCG/ACT inhaler, Inhale 2 puffs into the lungs every 6 (six) hours as needed for wheezing or shortness of breath., Disp: 1 Inhaler, Rfl: 1 .  aspirin EC 81 MG tablet, Take 81 mg by mouth daily., Disp: , Rfl:  .  atorvastatin (LIPITOR) 10 MG tablet, Take 10 mg by mouth at bedtime. , Disp: , Rfl:  .  fluticasone-salmeterol (ADVAIR HFA) 115-21 MCG/ACT inhaler, Inhale 2 puffs into the lungs 2 (two) times daily., Disp: , Rfl:  .  HYDROcodone-homatropine (HYCODAN) 5-1.5 MG/5ML syrup, Take 5 mLs by mouth every 6 (six) hours as needed for cough., Disp: , Rfl:  .  ipratropium (ATROVENT) 0.02 % nebulizer solution, Take 0.5 mg by nebulization 4 (four) times daily., Disp: , Rfl:   Physical exam:    Vitals:   02/22/17 1438  BP: (!) 153/79  Pulse: 61  Resp: 16  Temp: (!) 87.8 F (31 C)  TempSrc: Tympanic  Weight: 143 lb 4.8 oz (65 kg)   Physical Exam  Constitutional: She is oriented to person, place, and time and well-developed, well-nourished, and in no distress.  HENT:  Head: Normocephalic and atraumatic.  Poor dentition  Eyes: Pupils are equal, round, and reactive to light. EOM are normal.  Neck: Normal range of motion.  Cardiovascular: Normal rate, regular rhythm and normal heart sounds.   Pulmonary/Chest: Effort normal and breath sounds normal.  Abdominal: Soft. Bowel sounds are normal.  Neurological: She is alert and oriented to person, place, and time.  Skin: Skin is warm and dry.     CMP Latest Ref Rng & Units 02/22/2017  Glucose 65 - 99 mg/dL 112(H)  BUN 6 - 20 mg/dL 15  Creatinine 0.44 - 1.00 mg/dL 0.92  Sodium 135 - 145 mmol/L 138  Potassium 3.5 - 5.1 mmol/L 4.0  Chloride 101 - 111 mmol/L 102  CO2 22 - 32 mmol/L 30  Calcium 8.9 - 10.3 mg/dL 9.1  Total Protein 6.5 - 8.1 g/dL 6.9  Total Bilirubin 0.3 - 1.2 mg/dL 0.3  Alkaline Phos 38 - 126 U/L 82  AST 15 -  41 U/L 23  ALT 14 - 54 U/L 14   CBC Latest Ref Rng & Units 02/22/2017  WBC 3.6 - 11.0 K/uL 9.7  Hemoglobin 12.0 - 16.0 g/dL 13.3  Hematocrit 35.0 - 47.0 % 39.6  Platelets 150 - 440 K/uL 362    No images are attached to the encounter.  Mr Jeri Cos Wo Contrast  Result Date: 02/21/2017 CLINICAL DATA:  Recently diagnosed lung cancer with right lung mass. Evaluation for brain metastases. EXAM: MRI HEAD WITHOUT AND WITH CONTRAST TECHNIQUE: Multiplanar, multiecho pulse sequences of the brain and surrounding structures were obtained without and with intravenous contrast. CONTRAST:  17mL MULTIHANCE GADOBENATE DIMEGLUMINE 529 MG/ML IV SOLN COMPARISON:  Head CT 06/08/2007 FINDINGS: Brain: There is no evidence of acute infarct, intracranial hemorrhage, mass, midline shift, or extra-axial fluid collection. The  ventricles and sulci are within normal limits for age. Small foci of T2 hyperintensity scattered throughout the subcortical and deep cerebral white matter are nonspecific but compatible with mild-to-moderate chronic small vessel ischemic disease. Small chronic cortical/subcortical infarcts are noted in the left parietal lobe. No abnormal enhancement is identified. Vascular: Major intracranial vascular flow voids are preserved. Skull and upper cervical spine: Unremarkable bone marrow signal. Mild cervical disc and facet degeneration. Sinuses/Orbits: Bilateral cataract extraction. Mild posterior right ethmoid air cell mucosal thickening. Trace mastoid fluid bilaterally. Other: None. IMPRESSION: 1. No evidence of intracranial metastases. 2. Mild-to-moderate chronic small vessel ischemic disease. Electronically Signed   By: Logan Bores M.D.   On: 02/21/2017 13:24   Nm Pet Image Initial (pi) Skull Base To Thigh  Result Date: 02/07/2017 CLINICAL DATA:  Initial treatment strategy for right lung mass. EXAM: NUCLEAR MEDICINE PET SKULL BASE TO THIGH TECHNIQUE: 13.1 mCi F-18 FDG was injected intravenously. Full-ring PET imaging was performed from the skull base to thigh after the radiotracer. CT data was obtained and used for attenuation correction and anatomic localization. FASTING BLOOD GLUCOSE:  Value: 99 mg/dl COMPARISON:  CT chest 01/23/2017 FINDINGS: NECK: No hypermetabolic lymph nodes in the neck. CHEST: The abnormal soft tissue in the superior right hilum extending into the mediastinum is markedly hypermetabolic with SUV max = 83.3. Focal airspace opacity in the lateral aspect of the right apex shows low level hypermetabolism with SUV max = 2.4. Subtle area of airspace disease in the peripheral aspect of the posterior right lower lobe, along the minor fissure is hypermetabolic with SUV max = 3. Coronary artery and thoracic aortic atherosclerosis evident. ABDOMEN/PELVIS: No abnormal hypermetabolic activity within  the liver, pancreas, adrenal glands, or spleen. No hypermetabolic lymph nodes in the abdomen or pelvis. Patient is status post endograft repair of abdominal aortic aneurysm. Diverticular changes noted left colon without diverticulitis. SKELETON: No focal hypermetabolic activity to suggest skeletal metastasis. IMPRESSION: 1. Hypermetabolic disease in the upper right hilum and mediastinum compatible with neoplasm. 2. Mild FDG accumulation in peripheral areas of patchy airspace disease which may be secondary sites of neoplasm or related to infection/inflammation. 3. No evidence for distant metastases in the neck, abdomen, or pelvis. Electronically Signed   By: Misty Stanley M.D.   On: 02/07/2017 09:27     Assessment and plan- Patient is a 71 y.o. female with newly diagnosed limited stage small cell lung cancer Stage IIIB T3N2M0  I discussed the results of the pathology with the patient which shows that this is small cell lung cancer. Given that there is no evidence of metastatic disease in his curative intent with concurrent chemoradiation. I would favor using  cisplatin 70 mg/m IV on day 1 along with etoposide at 100 mg/m IV on day 1 and day 2 and day 3 every 3 weeks X 4 cycles given limited stage disease. Radiation oncology will be seeing her later today. If she responds well to treatment she would benefit from PCI down the line  We will plan to get IR guided port placement at this time and plan to start chemotherapy early next week. I discussed the risks and benefits of chemotherapy including all but not limited to nausea, vomiting, low blood counts, risk of infections. Risk of peripheral neuropathy, hearing loss and kidney dysfunction associated with his platinum. Patient understands and agrees to proceed as planned. If there is any evidence of kidney dysfunction during treatment, I will switch her to carboplatin. Treatment will be given with a curative intent  Patient continues to smoke and is trying  to cut down but is not interested in quitting at this time  Patient's family had multiple insightful questions and all of them were answered to their satisfaction.    Cancer Staging Small cell lung cancer, right upper lobe [facility]) Staging form: Lung, AJCC 8th Edition - Clinical stage from 02/23/2017: Stage IIIB (cT3, cN2, cM0) - Signed by Sindy Guadeloupe, MD on 02/23/2017     Visit Diagnosis 1. Need for prophylactic vaccination and inoculation against influenza   2. Small cell lung cancer, right upper lobe ([place])   3. Goals of care, counseling/discussion      Dr. Randa Evens, MD, MPH [facility] at [facility] Pager- [phone] 02/23/2017 5:12 PM

## 2017-02-26 ENCOUNTER — Ambulatory Visit
Admission: RE | Admit: 2017-02-26 | Discharge: 2017-02-26 | Disposition: A | Discharge status: Home / Self Care | Payer: Medicare Other | Source: Ambulatory Visit | Attending: Radiation Oncology | Admitting: Radiation Oncology

## 2017-02-26 ENCOUNTER — Telehealth: Payer: Self-pay | Admitting: *Deleted

## 2017-02-26 DIAGNOSIS — E7800 Pure hypercholesterolemia, unspecified: Secondary | ICD-10-CM | POA: Diagnosis not present

## 2017-02-26 DIAGNOSIS — Z79899 Other long term (current) drug therapy: Secondary | ICD-10-CM | POA: Diagnosis not present

## 2017-02-26 DIAGNOSIS — J449 Chronic obstructive pulmonary disease, unspecified: Secondary | ICD-10-CM | POA: Diagnosis not present

## 2017-02-26 DIAGNOSIS — F17210 Nicotine dependence, cigarettes, uncomplicated: Secondary | ICD-10-CM | POA: Diagnosis not present

## 2017-02-26 DIAGNOSIS — Z7982 Long term (current) use of aspirin: Secondary | ICD-10-CM | POA: Diagnosis not present

## 2017-02-26 DIAGNOSIS — I714 Abdominal aortic aneurysm, without rupture: Secondary | ICD-10-CM | POA: Diagnosis not present

## 2017-02-26 DIAGNOSIS — Z9221 Personal history of antineoplastic chemotherapy: Secondary | ICD-10-CM | POA: Diagnosis not present

## 2017-02-26 DIAGNOSIS — Z853 Personal history of malignant neoplasm of breast: Secondary | ICD-10-CM | POA: Diagnosis not present

## 2017-02-26 DIAGNOSIS — Z510 Encounter for antineoplastic radiation therapy: Secondary | ICD-10-CM | POA: Diagnosis present

## 2017-02-26 DIAGNOSIS — C3401 Malignant neoplasm of right main bronchus: Secondary | ICD-10-CM | POA: Diagnosis not present

## 2017-02-26 NOTE — Telephone Encounter (Signed)
Called pt to leave a message that her portacath placement is scheduled for 10/5 and she needs to be at [place] 8:30 for a 9:30 procedure.  She needs to be NPO after midnight the night before. She needs to have a driver due to she will get conscious sedation.  If she has any meds she will need to take with just a sip of water.  She can call me if she has any questions.  The pt's home phone is not working when I dialed it and so O left the message on her cell.  I did call radiation in [place] and asked Maudie Mercury to give her the above information due to I was unable to speak to pt directly and pt is due to come there to radiation at 11 am today. Maudie Mercury said she would pass on info above.

## 2017-02-27 ENCOUNTER — Other Ambulatory Visit: Payer: Self-pay | Admitting: *Deleted

## 2017-02-27 ENCOUNTER — Institutional Professional Consult (permissible substitution): Payer: Medicare Other | Admitting: Radiation Oncology

## 2017-02-27 ENCOUNTER — Inpatient Hospital Stay: Payer: Medicare Other | Attending: Oncology

## 2017-02-27 ENCOUNTER — Inpatient Hospital Stay: Payer: Medicare Other | Admitting: Oncology

## 2017-02-27 ENCOUNTER — Inpatient Hospital Stay: Payer: Medicare Other

## 2017-02-27 DIAGNOSIS — Z510 Encounter for antineoplastic radiation therapy: Secondary | ICD-10-CM | POA: Diagnosis not present

## 2017-02-27 DIAGNOSIS — K5900 Constipation, unspecified: Secondary | ICD-10-CM | POA: Insufficient documentation

## 2017-02-27 DIAGNOSIS — Z853 Personal history of malignant neoplasm of breast: Secondary | ICD-10-CM | POA: Insufficient documentation

## 2017-02-27 DIAGNOSIS — Z5111 Encounter for antineoplastic chemotherapy: Secondary | ICD-10-CM | POA: Insufficient documentation

## 2017-02-27 DIAGNOSIS — I714 Abdominal aortic aneurysm, without rupture: Secondary | ICD-10-CM | POA: Insufficient documentation

## 2017-02-27 DIAGNOSIS — J449 Chronic obstructive pulmonary disease, unspecified: Secondary | ICD-10-CM | POA: Insufficient documentation

## 2017-02-27 DIAGNOSIS — T402X5A Adverse effect of other opioids, initial encounter: Secondary | ICD-10-CM | POA: Diagnosis not present

## 2017-02-27 DIAGNOSIS — Z9012 Acquired absence of left breast and nipple: Secondary | ICD-10-CM | POA: Diagnosis not present

## 2017-02-27 DIAGNOSIS — F17210 Nicotine dependence, cigarettes, uncomplicated: Secondary | ICD-10-CM | POA: Diagnosis not present

## 2017-02-27 DIAGNOSIS — E7800 Pure hypercholesterolemia, unspecified: Secondary | ICD-10-CM | POA: Diagnosis not present

## 2017-02-27 DIAGNOSIS — Z9221 Personal history of antineoplastic chemotherapy: Secondary | ICD-10-CM | POA: Insufficient documentation

## 2017-02-27 DIAGNOSIS — Z79899 Other long term (current) drug therapy: Secondary | ICD-10-CM | POA: Insufficient documentation

## 2017-02-27 DIAGNOSIS — I700 Atherosclerosis of aorta: Secondary | ICD-10-CM | POA: Insufficient documentation

## 2017-02-27 DIAGNOSIS — C3411 Malignant neoplasm of upper lobe, right bronchus or lung: Secondary | ICD-10-CM | POA: Diagnosis not present

## 2017-02-27 DIAGNOSIS — J90 Pleural effusion, not elsewhere classified: Secondary | ICD-10-CM | POA: Diagnosis not present

## 2017-02-28 ENCOUNTER — Other Ambulatory Visit: Payer: Self-pay | Admitting: Radiology

## 2017-03-01 ENCOUNTER — Other Ambulatory Visit: Payer: Self-pay | Admitting: *Deleted

## 2017-03-01 NOTE — Progress Notes (Unsigned)
Patients EMLA cream has been approved by [employer] until 05/31/17. We will have to renew prior authorization at that time.  [facility] in [place] has been notified to fill the prescription.  Patient was also notified of the approval.

## 2017-03-02 ENCOUNTER — Ambulatory Visit
Admission: RE | Admit: 2017-03-02 | Discharge: 2017-03-02 | Disposition: A | Discharge status: Home / Self Care | Payer: Medicare Other | Source: Ambulatory Visit | Attending: Oncology | Admitting: Oncology

## 2017-03-02 DIAGNOSIS — J449 Chronic obstructive pulmonary disease, unspecified: Secondary | ICD-10-CM | POA: Insufficient documentation

## 2017-03-02 DIAGNOSIS — E7800 Pure hypercholesterolemia, unspecified: Secondary | ICD-10-CM | POA: Diagnosis not present

## 2017-03-02 DIAGNOSIS — Z853 Personal history of malignant neoplasm of breast: Secondary | ICD-10-CM | POA: Insufficient documentation

## 2017-03-02 DIAGNOSIS — C3491 Malignant neoplasm of unspecified part of right bronchus or lung: Secondary | ICD-10-CM | POA: Diagnosis present

## 2017-03-02 DIAGNOSIS — C771 Secondary and unspecified malignant neoplasm of intrathoracic lymph nodes: Secondary | ICD-10-CM | POA: Diagnosis not present

## 2017-03-02 DIAGNOSIS — C3490 Malignant neoplasm of unspecified part of unspecified bronchus or lung: Secondary | ICD-10-CM

## 2017-03-02 DIAGNOSIS — Z7982 Long term (current) use of aspirin: Secondary | ICD-10-CM | POA: Diagnosis not present

## 2017-03-02 DIAGNOSIS — F17210 Nicotine dependence, cigarettes, uncomplicated: Secondary | ICD-10-CM | POA: Diagnosis not present

## 2017-03-02 DIAGNOSIS — Z7951 Long term (current) use of inhaled steroids: Secondary | ICD-10-CM | POA: Insufficient documentation

## 2017-03-02 DIAGNOSIS — I714 Abdominal aortic aneurysm, without rupture: Secondary | ICD-10-CM | POA: Insufficient documentation

## 2017-03-02 HISTORY — PX: IR FLUORO GUIDE PORT INSERTION RIGHT: IMG5741

## 2017-03-02 LAB — CBC
HCT: 39.6 % (ref 35.0–47.0)
HEMOGLOBIN: 13.1 g/dL (ref 12.0–16.0)
MCH: 28.7 pg (ref 26.0–34.0)
MCHC: 33 g/dL (ref 32.0–36.0)
MCV: 87 fL (ref 80.0–100.0)
PLATELETS: 288 10*3/uL (ref 150–440)
RBC: 4.55 MIL/uL (ref 3.80–5.20)
RDW: 15.6 % — ABNORMAL HIGH (ref 11.5–14.5)
WBC: 9 10*3/uL (ref 3.6–11.0)

## 2017-03-02 LAB — BASIC METABOLIC PANEL
ANION GAP: 11 (ref 5–15)
BUN: 14 mg/dL (ref 6–20)
CALCIUM: 9.2 mg/dL (ref 8.9–10.3)
CO2: 26 mmol/L (ref 22–32)
CREATININE: 0.71 mg/dL (ref 0.44–1.00)
Chloride: 106 mmol/L (ref 101–111)
GFR calc non Af Amer: >60 mL/min (ref >60)
Glucose, Bld: 99 mg/dL (ref 65–99)
Potassium: 4.2 mmol/L (ref 3.5–5.1)
SODIUM: 143 mmol/L (ref 135–145)

## 2017-03-02 LAB — PROTIME-INR
INR: 0.91
PROTHROMBIN TIME: 12.2 s (ref 11.4–15.2)

## 2017-03-02 LAB — APTT: aPTT: 28 seconds (ref 24–36)

## 2017-03-02 MED ORDER — CEFAZOLIN SODIUM-DEXTROSE 2-4 GM/100ML-% IV SOLN
2.0000 g | INTRAVENOUS | Status: AC
  Administered 2017-03-02: 2 g via INTRAVENOUS
  Filled 2017-03-02: qty 100

## 2017-03-02 MED ORDER — MIDAZOLAM HCL 2 MG/2ML IJ SOLN
INTRAMUSCULAR | Status: AC
  Filled 2017-03-02: qty 2

## 2017-03-02 MED ORDER — HEPARIN SOD (PORK) LOCK FLUSH 100 UNIT/ML IV SOLN
INTRAVENOUS | Status: AC
  Filled 2017-03-02: qty 5

## 2017-03-02 MED ORDER — FENTANYL CITRATE (PF) 100 MCG/2ML IJ SOLN
INTRAMUSCULAR | Status: AC | PRN
  Administered 2017-03-02: 50 ug via INTRAVENOUS

## 2017-03-02 MED ORDER — MIDAZOLAM HCL 2 MG/2ML IJ SOLN
INTRAMUSCULAR | Status: AC | PRN
  Administered 2017-03-02: 1 mg via INTRAVENOUS

## 2017-03-02 MED ORDER — HEPARIN (PORCINE) IN NACL 2-0.9 UNIT/ML-% IJ SOLN
INTRAMUSCULAR | Status: AC
  Filled 2017-03-02: qty 500

## 2017-03-02 MED ORDER — FENTANYL CITRATE (PF) 100 MCG/2ML IJ SOLN
INTRAMUSCULAR | Status: AC
  Filled 2017-03-02: qty 2

## 2017-03-02 MED ORDER — SODIUM CHLORIDE 0.9 % IV SOLN: INTRAVENOUS | Status: DC

## 2017-03-02 MED ORDER — LIDOCAINE HCL (PF) 1 % IJ SOLN
INTRAMUSCULAR | Status: AC
  Filled 2017-03-02: qty 30

## 2017-03-02 NOTE — H&P (Signed)
Chief Complaint: Patient was seen in consultation today for tunneled port placement at the request of Rao,Archana C  Referring Physician(s): Rao,Archana C  Patient Status: [facility] - Out-pt  History of Present Illness: Roberta [name] is a 71 y.o. female with small cell lung carcinoma and extensive right perihilar tumor with associated hilar and mediastinal lymph node metastases.  Bronchoscopic biopsy was positive for small cell CA.  Now here for port placement.  To start chemotherapy and radiation therapy on Monday.  Has a history of breast CA with prior left mastectomy.  Past Medical History:  Diagnosis Date  . AAA (abdominal aortic aneurysm) ([place])   . Breast cancer ([place]) 2002   left  . COPD (chronic obstructive pulmonary disease) ([place])   . High cholesterol   . Personal history of chemotherapy   . Tobacco abuse      Past Surgical History:  Procedure Laterality Date  . ABDOMINAL HYSTERECTOMY    . ENDOBRONCHIAL ULTRASOUND N/A 02/19/2017   Procedure: ENDOBRONCHIAL ULTRASOUND;  Surgeon: Flora Lipps, MD;  Location: [facility] ORS;  Service: Cardiopulmonary;  Laterality: N/A;  . ENDOVASCULAR REPAIR/STENT GRAFT N/A 12/06/2016   Procedure: Endovascular Repair/Stent Graft;  Surgeon: Katha Cabal, MD;  Location: [place] [facility];  Service: Cardiovascular;  Laterality: N/A;  . MASTECTOMY Left 2002   with sentinel node     Allergies: Patient has no known allergies.  Medications: Prior to Admission medications   Medication Sig Start Date End Date Taking? Authorizing Provider  acetaminophen (TYLENOL) 500 MG tablet Take 1,000 mg by mouth every 6 (six) hours as needed (for pain.).   Yes [provider]  albuterol (PROVENTIL HFA;VENTOLIN HFA) 108 (90 Base) MCG/ACT inhaler Inhale 2 puffs into the lungs every 6 (six) hours as needed for wheezing or shortness of breath. 03/23/16  Yes Frederich Cha, MD  aspirin EC 81 MG tablet Take 81 mg by mouth daily.   Yes [provider]  atorvastatin (LIPITOR) 10 MG tablet Take 10 mg by mouth at bedtime.  08/11/16  Yes [provider]  fluticasone-salmeterol (ADVAIR HFA) 115-21 MCG/ACT inhaler Inhale 2 puffs into the lungs 2 (two) times daily.   Yes [provider]  ipratropium (ATROVENT) 0.02 % nebulizer solution Take 0.5 mg by nebulization 4 (four) times daily.   Yes [provider]  dexamethasone (DECADRON) 4 MG tablet Take 2 tablets two times a day for 1 day on day 4 after cisplatin chemotherapy. Take with food. Patient not taking: Reported on 03/02/2017 02/23/17   Sindy Guadeloupe, MD  HYDROcodone-homatropine [facility]) 5-1.5 MG/5ML syrup Take 5 mLs by mouth every 6 (six) hours as needed for cough.    [provider]  lidocaine-prilocaine (EMLA) cream Apply to affected area once Patient not taking: Reported on 03/02/2017 02/23/17   Sindy Guadeloupe, MD  LORazepam (ATIVAN) 0.5 MG tablet Take 1 tablet (0.5 mg total) by mouth every 6 (six) hours as needed (Nausea or vomiting). Patient not taking: Reported on 03/02/2017 02/23/17   Sindy Guadeloupe, MD  ondansetron (ZOFRAN) 8 MG tablet Take 1 tablet (8 mg total) by mouth 2 (two) times daily as needed. Start on the third day after cisplatin chemotherapy. Patient not taking: Reported on 03/02/2017 02/23/17   Sindy Guadeloupe, MD  prochlorperazine (COMPAZINE) 10 MG tablet Take 1 tablet (10 mg total) by mouth every 6 (six) hours as needed (Nausea or vomiting). Patient not taking: Reported on 03/02/2017 02/23/17   Sindy Guadeloupe, MD  Family History  Problem Relation Age of Onset  . Breast cancer Neg Hx     Social History   Social History  . Marital status: Widowed    Spouse name: N/A  . Number of children: N/A  . Years of education: N/A   Social History Main Topics  . Smoking status: Current Every Day Smoker    Packs/day: 0.50    Types: Cigarettes  . Smokeless tobacco: Never Used  . Alcohol use No  . Drug use: No  . Sexual activity:  No   Other Topics Concern  . None   Social History Narrative  . None    ECOG Status: 1 - Symptomatic but completely ambulatory  Review of Systems: A 12 point ROS discussed and pertinent positives are indicated in the HPI above.  All other systems are negative.  Review of Systems  Constitutional: Negative.   HENT: Negative.   Respiratory: Positive for cough and shortness of breath. Negative for chest tightness.        Chronic dyspnea from lung disease.  Cardiovascular: Negative.   Gastrointestinal: Negative.   Genitourinary: Negative.   Musculoskeletal: Negative.   Neurological: Negative.     Vital Signs: BP 110/75   Pulse 72   Temp 97.8 F (36.6 C) (Oral)   Resp 18   Ht 5' 4.5" (1.638 m)   Wt 143 lb (64.9 kg)   SpO2 97%   BMI 24.17 kg/m   Physical Exam  Constitutional: She is oriented to person, place, and time. No distress.  HENT:  Head: Normocephalic and atraumatic.  Neck: Neck supple. No JVD present.  Cardiovascular: Normal rate, regular rhythm and normal heart sounds.  Exam reveals no gallop and no friction rub.   No murmur heard. Pulmonary/Chest: Effort normal. No stridor. No respiratory distress. She has wheezes. She has no rales.  Mild wheezing on right anteriorly.  Abdominal: Soft. Bowel sounds are normal. She exhibits no distension and no mass. There is no tenderness. There is no rebound and no guarding.  Musculoskeletal: She exhibits no edema.  Lymphadenopathy:    She has no cervical adenopathy.  Neurological: She is alert and oriented to person, place, and time.  Skin: Skin is warm and dry. She is not diaphoretic.  Vitals reviewed.   Imaging: Mr Jeri Cos HE Contrast  Result Date: 02/21/2017 CLINICAL DATA:  Recently diagnosed lung cancer with right lung mass. Evaluation for brain metastases. EXAM: MRI HEAD WITHOUT AND WITH CONTRAST TECHNIQUE: Multiplanar, multiecho pulse sequences of the brain and surrounding structures were obtained without and  with intravenous contrast. CONTRAST:  46mL MULTIHANCE GADOBENATE DIMEGLUMINE 529 MG/ML IV SOLN COMPARISON:  Head CT 06/08/2007 FINDINGS: Brain: There is no evidence of acute infarct, intracranial hemorrhage, mass, midline shift, or extra-axial fluid collection. The ventricles and sulci are within normal limits for age. Small foci of T2 hyperintensity scattered throughout the subcortical and deep cerebral white matter are nonspecific but compatible with mild-to-moderate chronic small vessel ischemic disease. Small chronic cortical/subcortical infarcts are noted in the left parietal lobe. No abnormal enhancement is identified. Vascular: Major intracranial vascular flow voids are preserved. Skull and upper cervical spine: Unremarkable bone marrow signal. Mild cervical disc and facet degeneration. Sinuses/Orbits: Bilateral cataract extraction. Mild posterior right ethmoid air cell mucosal thickening. Trace mastoid fluid bilaterally. Other: None. IMPRESSION: 1. No evidence of intracranial metastases. 2. Mild-to-moderate chronic small vessel ischemic disease. Electronically Signed   By: Logan Bores M.D.   On: 02/21/2017 13:24   Nm Pet Image  Initial (pi) Skull Base To Thigh  Result Date: 02/07/2017 CLINICAL DATA:  Initial treatment strategy for right lung mass. EXAM: NUCLEAR MEDICINE PET SKULL BASE TO THIGH TECHNIQUE: 13.1 mCi F-18 FDG was injected intravenously. Full-ring PET imaging was performed from the skull base to thigh after the radiotracer. CT data was obtained and used for attenuation correction and anatomic localization. FASTING BLOOD GLUCOSE:  Value: 99 mg/dl COMPARISON:  CT chest 01/23/2017 FINDINGS: NECK: No hypermetabolic lymph nodes in the neck. CHEST: The abnormal soft tissue in the superior right hilum extending into the mediastinum is markedly hypermetabolic with SUV max = 25.8. Focal airspace opacity in the lateral aspect of the right apex shows low level hypermetabolism with SUV max = 2.4. Subtle  area of airspace disease in the peripheral aspect of the posterior right lower lobe, along the minor fissure is hypermetabolic with SUV max = 3. Coronary artery and thoracic aortic atherosclerosis evident. ABDOMEN/PELVIS: No abnormal hypermetabolic activity within the liver, pancreas, adrenal glands, or spleen. No hypermetabolic lymph nodes in the abdomen or pelvis. Patient is status post endograft repair of abdominal aortic aneurysm. Diverticular changes noted left colon without diverticulitis. SKELETON: No focal hypermetabolic activity to suggest skeletal metastasis. IMPRESSION: 1. Hypermetabolic disease in the upper right hilum and mediastinum compatible with neoplasm. 2. Mild FDG accumulation in peripheral areas of patchy airspace disease which may be secondary sites of neoplasm or related to infection/inflammation. 3. No evidence for distant metastases in the neck, abdomen, or pelvis. Electronically Signed   By: Misty Stanley M.D.   On: 02/07/2017 09:27    Labs:  CBC:  Recent Labs  10/22/16 0848 11/27/16 0925 12/07/16 0311 02/22/17 1410  WBC 11.3* 8.0 12.6* 9.7  HGB 12.8 13.2 11.8* 13.3  HCT 38.1 40.4 36.0 39.6  PLT 279 275 237 362    COAGS:  Recent Labs  11/27/16 0925  INR 0.95  APTT 28    BMP:  Recent Labs  10/22/16 0848 11/27/16 0925 12/07/16 0311 02/21/17 1103 02/22/17 1410  NA 138 141 141  --  138  K 3.6 4.9 3.9  --  4.0  CL 104 107 110  --  102  CO2 27 27 25   --  30  GLUCOSE 132* 103* 105*  --  112*  BUN 15 17 10   --  15  CALCIUM 8.5* 9.4 8.5*  --  9.1  CREATININE 0.95 0.82 0.54 1.00 0.92  GFRNONAA 59* >60 >60  --  >60  GFRAA >60 >60 >60  --  >60    LIVER FUNCTION TESTS:  Recent Labs  10/22/16 0848 02/22/17 1410  BILITOT 0.5 0.3  AST 26 23  ALT 18 14  ALKPHOS 98 82  PROT 6.5 6.9  ALBUMIN 2.9* 3.5    Assessment and Plan:  For port placement today.  As she will be receiving extensive right chest radiation, I have recommended left chest port  placement.  There is ample chest wall tissue in the infraclavicular region by exam above her mastectomy scar which will easily accomodate a port.  Risks and benefits discussed with the patient including, but not limited to bleeding, infection, pneumothorax, or fibrin sheath development and need for additional procedures. All of the patient's questions were answered, patient is agreeable to proceed. Consent signed and in chart.  Thank you for this interesting consult.  I greatly enjoyed meeting Roberta [name] and look forward to participating in their care.  A copy of this report was sent to the requesting provider  on this date.  Electronically Signed: Azzie Roup, MD 03/02/2017, 9:05 AM   I spent a total of 30 Minutes in face to face in clinical consultation, greater than 50% of which was counseling/coordinating care for porta-cath placement.

## 2017-03-02 NOTE — Procedures (Signed)
Interventional Radiology Procedure Note  Procedure: Placement of a right IJ approach single lumen PowerPort.  Tip is positioned at the superior cavoatrial junction and catheter is ready for immediate use.  Complications: No immediate Recommendations:  - Ok to shower tomorrow - Do not submerge for 7 days - Routine line care   Marija Calamari T. Barry Faircloth, M.D Pager:  [phone]   

## 2017-03-05 ENCOUNTER — Inpatient Hospital Stay: Payer: Medicare Other

## 2017-03-05 ENCOUNTER — Inpatient Hospital Stay (HOSPITAL_BASED_OUTPATIENT_CLINIC_OR_DEPARTMENT_OTHER): Payer: Medicare Other | Admitting: Oncology

## 2017-03-05 ENCOUNTER — Encounter: Payer: Self-pay | Admitting: Oncology

## 2017-03-05 VITALS — BP 113/64 | HR 65 | Temp 96.4°F | Resp 18 | Ht 64.72 in | Wt 145.5 lb

## 2017-03-05 VITALS — BP 122/66 | HR 82 | Temp 96.5°F | Resp 18

## 2017-03-05 DIAGNOSIS — C3411 Malignant neoplasm of upper lobe, right bronchus or lung: Secondary | ICD-10-CM | POA: Diagnosis not present

## 2017-03-05 DIAGNOSIS — Z5111 Encounter for antineoplastic chemotherapy: Secondary | ICD-10-CM

## 2017-03-05 DIAGNOSIS — Z853 Personal history of malignant neoplasm of breast: Secondary | ICD-10-CM

## 2017-03-05 DIAGNOSIS — F17210 Nicotine dependence, cigarettes, uncomplicated: Secondary | ICD-10-CM | POA: Diagnosis not present

## 2017-03-05 DIAGNOSIS — Z23 Encounter for immunization: Secondary | ICD-10-CM

## 2017-03-05 DIAGNOSIS — Z9012 Acquired absence of left breast and nipple: Secondary | ICD-10-CM

## 2017-03-05 DIAGNOSIS — Z79899 Other long term (current) drug therapy: Secondary | ICD-10-CM

## 2017-03-05 DIAGNOSIS — Z9221 Personal history of antineoplastic chemotherapy: Secondary | ICD-10-CM | POA: Diagnosis not present

## 2017-03-05 LAB — COMPREHENSIVE METABOLIC PANEL
ALT: 11 U/L — ABNORMAL LOW (ref 14–54)
ANION GAP: 6 (ref 5–15)
AST: 21 U/L (ref 15–41)
Albumin: 3.3 g/dL — ABNORMAL LOW (ref 3.5–5.0)
Alkaline Phosphatase: 85 U/L (ref 38–126)
BUN: 24 mg/dL — ABNORMAL HIGH (ref 6–20)
CHLORIDE: 106 mmol/L (ref 101–111)
CO2: 25 mmol/L (ref 22–32)
CREATININE: 0.74 mg/dL (ref 0.44–1.00)
Calcium: 8.6 mg/dL — ABNORMAL LOW (ref 8.9–10.3)
Glucose, Bld: 115 mg/dL — ABNORMAL HIGH (ref 65–99)
Potassium: 4.1 mmol/L (ref 3.5–5.1)
SODIUM: 137 mmol/L (ref 135–145)
Total Bilirubin: 0.3 mg/dL (ref 0.3–1.2)
Total Protein: 6.8 g/dL (ref 6.5–8.1)

## 2017-03-05 LAB — CBC WITH DIFFERENTIAL/PLATELET
Basophils Absolute: 0.1 10*3/uL (ref 0–0.1)
Basophils Relative: 1 %
EOS ABS: 0.1 10*3/uL (ref 0–0.7)
EOS PCT: 1 %
HCT: 36.8 % (ref 35.0–47.0)
Hemoglobin: 12.2 g/dL (ref 12.0–16.0)
LYMPHS ABS: 2.1 10*3/uL (ref 1.0–3.6)
Lymphocytes Relative: 21 %
MCH: 29 pg (ref 26.0–34.0)
MCHC: 33 g/dL (ref 32.0–36.0)
MCV: 87.7 fL (ref 80.0–100.0)
Monocytes Absolute: 0.7 10*3/uL (ref 0.2–0.9)
Monocytes Relative: 6 %
NEUTROS PCT: 71 %
Neutro Abs: 7.5 10*3/uL — ABNORMAL HIGH (ref 1.4–6.5)
PLATELETS: 282 10*3/uL (ref 150–440)
RBC: 4.2 MIL/uL (ref 3.80–5.20)
RDW: 15.3 % — AB (ref 11.5–14.5)
WBC: 10.4 10*3/uL (ref 3.6–11.0)

## 2017-03-05 MED ORDER — SODIUM CHLORIDE 0.9 % IV SOLN
Freq: Once | INTRAVENOUS | Status: AC
  Administered 2017-03-05: 12:00:00 via INTRAVENOUS
  Filled 2017-03-05: qty 5

## 2017-03-05 MED ORDER — SODIUM CHLORIDE 0.9 % IV SOLN
100.0000 mg/m2 | Freq: Once | INTRAVENOUS | Status: AC
  Administered 2017-03-05: 170 mg via INTRAVENOUS
  Filled 2017-03-05: qty 8.5

## 2017-03-05 MED ORDER — PALONOSETRON HCL INJECTION 0.25 MG/5ML
INTRAVENOUS | Status: AC
  Filled 2017-03-05: qty 5

## 2017-03-05 MED ORDER — SODIUM CHLORIDE 0.9 % IV SOLN
Freq: Once | INTRAVENOUS | Status: AC
  Administered 2017-03-05: 09:00:00 via INTRAVENOUS
  Filled 2017-03-05: qty 1000

## 2017-03-05 MED ORDER — SODIUM CHLORIDE 0.9% FLUSH
10.0000 mL | INTRAVENOUS | Status: DC | PRN
  Administered 2017-03-05: 10 mL via INTRAVENOUS
  Filled 2017-03-05: qty 10

## 2017-03-05 MED ORDER — SODIUM CHLORIDE 0.9 % IV SOLN
60.0000 mg/m2 | Freq: Once | INTRAVENOUS | Status: AC
  Administered 2017-03-05: 103 mg via INTRAVENOUS
  Filled 2017-03-05: qty 100

## 2017-03-05 MED ORDER — POTASSIUM CHLORIDE 2 MEQ/ML IV SOLN
Freq: Once | INTRAVENOUS | Status: DC
  Filled 2017-03-05: qty 10

## 2017-03-05 MED ORDER — POTASSIUM CHLORIDE 2 MEQ/ML IV SOLN
Freq: Once | INTRAVENOUS | Status: AC
  Administered 2017-03-05: 10:00:00 via INTRAVENOUS
  Filled 2017-03-05: qty 1000

## 2017-03-05 MED ORDER — HEPARIN SOD (PORK) LOCK FLUSH 100 UNIT/ML IV SOLN
500.0000 [IU] | Freq: Once | INTRAVENOUS | Status: AC
  Administered 2017-03-05: 500 [IU] via INTRAVENOUS
  Filled 2017-03-05: qty 5

## 2017-03-05 MED ORDER — PALONOSETRON HCL INJECTION 0.25 MG/5ML
0.2500 mg | Freq: Once | INTRAVENOUS | Status: AC
  Administered 2017-03-05: 0.25 mg via INTRAVENOUS

## 2017-03-05 NOTE — Progress Notes (Signed)
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 Patient is a 71 year old female with a long-standing history of smoking and smoked 1 pack per day for more than 50 years and is now down to half a pack per day. She also has COPD and recently had abdominal aortic aneurysm status post stenting. She presented with symptoms of mild hemoptysis in August 2018 which prompted CT chest  2. CT chest on 01/23/2017 showed:IMPRESSION: 1. Large right suprahilar mass extending into the mediastinum consistent with primary lung carcinoma with mediastinal extension. 2. Partial atelectasis of right upper lobe due to this large suprahilar mass with probable postobstructive pneumonitis in the right upper lobe peripherally. 3. Small right pleural effusion. 4. Severe thoracic aortic atherosclerosis. Diffuse coronary artery Calcifications.   3. This was followed by a PET CT scan on 02/07/2017 showed:IMPRESSION: 1. Hypermetabolic disease in the upper right hilum and mediastinum compatible with neoplasm. 2. Mild FDG accumulation in peripheral areas of patchy airspace disease which may be secondary sites of neoplasm or related to infection/inflammation. 3. No evidence for distant metastases in the neck, abdomen, or pelvis.   4. Patient underwent bronchoscopy guided biopsy with Dr. Mortimer Fries. Pathology showed small cell lung cancer. MRI brain negative for metastatic disease  5. Patient lives  with her sister and is independent of her ADLs. She reports occasional blood-tinged sputum but no significant frank hemoptysis. Her appetite is good and she denies any unintentional weight loss. She denies any pain. She does have a history of breast cancer in 2002 status post left mastectomy followed by adjuvant chemotherapy. This was treated by Dr. Oliva Bustard. She did not receive any adjuvant radiation.  Interval history- mild soreness at port site. Denies other complaints  ECOG PS- 1 Pain scale- 0   Review of systems- Review of Systems  Constitutional: Positive for malaise/fatigue. Negative for chills, fever and weight loss.  HENT: Negative for congestion, ear discharge and nosebleeds.   Eyes: Negative for blurred vision.  Respiratory: Negative for cough, hemoptysis, sputum production, shortness of breath and wheezing.   Cardiovascular: Negative for chest pain, palpitations, orthopnea and claudication.  Gastrointestinal: Negative for abdominal pain, blood in stool, constipation, diarrhea, heartburn, melena, nausea and vomiting.  Genitourinary: Negative for dysuria, flank pain, frequency, hematuria and urgency.  Musculoskeletal: Negative for back pain, joint pain and myalgias.  Skin: Negative for rash.  Neurological: Negative for dizziness, tingling, focal weakness, seizures, weakness and headaches.  Endo/Heme/Allergies: Does not bruise/bleed easily.  Psychiatric/Behavioral: Negative for depression and suicidal ideas. The patient does not have insomnia.       No Known Allergies   Past Medical History:  Diagnosis Date  . AAA (abdominal aortic aneurysm) ([place])   . Breast cancer ([place]) 2002   left  . COPD (chronic obstructive pulmonary disease) ([place])   . High cholesterol   . Personal history of chemotherapy   . Tobacco abuse      Past Surgical History:  Procedure Laterality Date  . ABDOMINAL HYSTERECTOMY    . ENDOBRONCHIAL ULTRASOUND N/A 02/19/2017  Procedure: ENDOBRONCHIAL  ULTRASOUND;  Surgeon: Flora Lipps, MD;  Location: [facility] ORS;  Service: Cardiopulmonary;  Laterality: N/A;  . ENDOVASCULAR REPAIR/STENT GRAFT N/A 12/06/2016   Procedure: Endovascular Repair/Stent Graft;  Surgeon: Katha Cabal, MD;  Location: [facility];  Service: Cardiovascular;  Laterality: N/A;  . IR FLUORO GUIDE PORT INSERTION RIGHT  03/02/2017  . MASTECTOMY Left 2002   with sentinel node     Social History   Social History  . Marital status: Widowed    Spouse name: N/A  . Number of children: N/A  . Years of education: N/A   Occupational History  . Not on file.   Social History Main Topics  . Smoking status: Current Every Day Smoker    Packs/day: 0.50    Types: Cigarettes  . Smokeless tobacco: Never Used  . Alcohol use No  . Drug use: No  . Sexual activity: No   Other Topics Concern  . Not on file   Social History Narrative  . No narrative on file    Family History  Problem Relation Age of Onset  . Breast cancer Neg Hx      Current Outpatient Prescriptions:  .  acetaminophen (TYLENOL) 500 MG tablet, Take 1,000 mg by mouth every 6 (six) hours as needed (for pain.)., Disp: , Rfl:  .  albuterol (PROVENTIL HFA;VENTOLIN HFA) 108 (90 Base) MCG/ACT inhaler, Inhale 2 puffs into the lungs every 6 (six) hours as needed for wheezing or shortness of breath., Disp: 1 Inhaler, Rfl: 1 .  aspirin EC 81 MG tablet, Take 81 mg by mouth daily., Disp: , Rfl:  .  atorvastatin (LIPITOR) 10 MG tablet, Take 10 mg by mouth at bedtime. , Disp: , Rfl:  .  dexamethasone (DECADRON) 4 MG tablet, Take 2 tablets two times a day for 1 day on day 4 after cisplatin chemotherapy. Take with food. (Patient not taking: Reported on 03/02/2017), Disp: 30 tablet, Rfl: 1 .  fluticasone-salmeterol (ADVAIR HFA) 115-21 MCG/ACT inhaler, Inhale 2 puffs into the lungs 2 (two) times daily., Disp: , Rfl:  .  HYDROcodone-homatropine (HYCODAN) 5-1.5 MG/5ML syrup, Take 5 mLs by mouth every 6 (six) hours as  needed for cough., Disp: , Rfl:  .  ipratropium (ATROVENT) 0.02 % nebulizer solution, Take 0.5 mg by nebulization 4 (four) times daily., Disp: , Rfl:  .  lidocaine-prilocaine (EMLA) cream, Apply to affected area once (Patient not taking: Reported on 03/02/2017), Disp: 30 g, Rfl: 3 .  LORazepam (ATIVAN) 0.5 MG tablet, Take 1 tablet (0.5 mg total) by mouth every 6 (six) hours as needed (Nausea or vomiting). (Patient not taking: Reported on 03/02/2017), Disp: 30 tablet, Rfl: 0 .  ondansetron (ZOFRAN) 8 MG tablet, Take 1 tablet (8 mg total) by mouth 2 (two) times daily as needed. Start on the third day after cisplatin chemotherapy. (Patient not taking: Reported on 03/02/2017), Disp: 30 tablet, Rfl: 1 .  prochlorperazine (COMPAZINE) 10 MG tablet, Take 1 tablet (10 mg total) by mouth every 6 (six) hours as needed (Nausea or vomiting). (Patient not taking: Reported on 03/02/2017), Disp: 30 tablet, Rfl: 1  Physical exam:  Vitals:   03/05/17 0858  BP: 113/64  Pulse: 65  Resp: 18  Temp: (!) 96.4 F (35.8 C)  TempSrc: Tympanic  Weight: 145 lb 8.1 oz (66 kg)  Height: 5' 4.72" (1.644 m)   Physical Exam  Constitutional: She is oriented to person, place, and time.  Thin, appears in no acute distress  HENT:  Head: Normocephalic and atraumatic.  Poor dentition  Eyes: Pupils are equal, round, and reactive to light. EOM are normal.  Neck: Normal range of motion.  Cardiovascular: Normal rate, regular rhythm and normal heart sounds.   Pulmonary/Chest: Effort normal and breath sounds normal.  Left chest wall port in place. S/p left mastectomy without reconstruction  Abdominal: Soft. Bowel sounds are normal.  Neurological: She is alert and oriented to person, place, and time.  Skin: Skin is warm and dry.     CMP Latest Ref Rng & Units 03/05/2017  Glucose 65 - 99 mg/dL 115(H)  BUN 6 - 20 mg/dL 24(H)  Creatinine 0.44 - 1.00 mg/dL 0.74  Sodium 135 - 145 mmol/L 137  Potassium 3.5 - 5.1 mmol/L 4.1  Chloride  101 - 111 mmol/L 106  CO2 22 - 32 mmol/L 25  Calcium 8.9 - 10.3 mg/dL 8.6(L)  Total Protein 6.5 - 8.1 g/dL 6.8  Total Bilirubin 0.3 - 1.2 mg/dL 0.3  Alkaline Phos 38 - 126 U/L 85  AST 15 - 41 U/L 21  ALT 14 - 54 U/L 11(L)   CBC Latest Ref Rng & Units 03/05/2017  WBC 3.6 - 11.0 K/uL 10.4  Hemoglobin 12.0 - 16.0 g/dL 12.2  Hematocrit 35.0 - 47.0 % 36.8  Platelets 150 - 440 K/uL 282    No images are attached to the encounter.  Mr Jeri Cos Wo Contrast  Result Date: 02/21/2017 CLINICAL DATA:  Recently diagnosed lung cancer with right lung mass. Evaluation for brain metastases. EXAM: MRI HEAD WITHOUT AND WITH CONTRAST TECHNIQUE: Multiplanar, multiecho pulse sequences of the brain and surrounding structures were obtained without and with intravenous contrast. CONTRAST:  35mL MULTIHANCE GADOBENATE DIMEGLUMINE 529 MG/ML IV SOLN COMPARISON:  Head CT 06/08/2007 FINDINGS: Brain: There is no evidence of acute infarct, intracranial hemorrhage, mass, midline shift, or extra-axial fluid collection. The ventricles and sulci are within normal limits for age. Small foci of T2 hyperintensity scattered throughout the subcortical and deep cerebral white matter are nonspecific but compatible with mild-to-moderate chronic small vessel ischemic disease. Small chronic cortical/subcortical infarcts are noted in the left parietal lobe. No abnormal enhancement is identified. Vascular: Major intracranial vascular flow voids are preserved. Skull and upper cervical spine: Unremarkable bone marrow signal. Mild cervical disc and facet degeneration. Sinuses/Orbits: Bilateral cataract extraction. Mild posterior right ethmoid air cell mucosal thickening. Trace mastoid fluid bilaterally. Other: None. IMPRESSION: 1. No evidence of intracranial metastases. 2. Mild-to-moderate chronic small vessel ischemic disease. Electronically Signed   By: Logan Bores M.D.   On: 02/21/2017 13:24   Nm Pet Image Initial (pi) Skull Base To  Thigh  Result Date: 02/07/2017 CLINICAL DATA:  Initial treatment strategy for right lung mass. EXAM: NUCLEAR MEDICINE PET SKULL BASE TO THIGH TECHNIQUE: 13.1 mCi F-18 FDG was injected intravenously. Full-ring PET imaging was performed from the skull base to thigh after the radiotracer. CT data was obtained and used for attenuation correction and anatomic localization. FASTING BLOOD GLUCOSE:  Value: 99 mg/dl COMPARISON:  CT chest 01/23/2017 FINDINGS: NECK: No hypermetabolic lymph nodes in the neck. CHEST: The abnormal soft tissue in the superior right hilum extending into the mediastinum is markedly hypermetabolic with SUV max = 41.2. Focal airspace opacity in the lateral aspect of the right apex shows low level hypermetabolism with SUV max = 2.4. Subtle area of airspace disease in the peripheral aspect of the posterior right lower lobe, along the minor fissure is hypermetabolic with SUV max = 3. Coronary artery and thoracic  aortic atherosclerosis evident. ABDOMEN/PELVIS: No abnormal hypermetabolic activity within the liver, pancreas, adrenal glands, or spleen. No hypermetabolic lymph nodes in the abdomen or pelvis. Patient is status post endograft repair of abdominal aortic aneurysm. Diverticular changes noted left colon without diverticulitis. SKELETON: No focal hypermetabolic activity to suggest skeletal metastasis. IMPRESSION: 1. Hypermetabolic disease in the upper right hilum and mediastinum compatible with neoplasm. 2. Mild FDG accumulation in peripheral areas of patchy airspace disease which may be secondary sites of neoplasm or related to infection/inflammation. 3. No evidence for distant metastases in the neck, abdomen, or pelvis. Electronically Signed   By: Misty Stanley M.D.   On: 02/07/2017 09:27   Ir Fluoro Guide Port Insertion Right  Result Date: 03/02/2017 CLINICAL DATA:  Right-sided small cell lung carcinoma and need for porta cath to start chemotherapy. EXAM: IMPLANTED PORT A CATH PLACEMENT  WITH ULTRASOUND AND FLUOROSCOPIC GUIDANCE ANESTHESIA/SEDATION: 2.0 mg IV Versed; 100 mcg IV Fentanyl Total Moderate Sedation Time:  37 minutes The patient's level of consciousness and physiologic status were continuously monitored during the procedure by Radiology nursing. Additional Medications: 2 g IV Ancef. FLUOROSCOPY TIME:  18 seconds.  2.0 mGy. PROCEDURE: The procedure, risks, benefits, and alternatives were explained to the patient. Questions regarding the procedure were encouraged and answered. The patient understands and consents to the procedure. A time-out was performed prior to initiating the procedure. Ultrasound was utilized to confirm patency of the left internal jugular vein. The left neck and chest were prepped with chlorhexidine in a sterile fashion, and a sterile drape was applied covering the operative field. Maximum barrier sterile technique with sterile gowns and gloves were used for the procedure. Local anesthesia was provided with 1% lidocaine. After creating a small venotomy incision, a 21 gauge needle was advanced into the left internal jugular vein under direct, real-time ultrasound guidance. Ultrasound image documentation was performed. After securing guidewire access, an 8 Fr dilator was placed. A J-wire was kinked to measure appropriate catheter length. A subcutaneous port pocket was then created along the upper chest wall utilizing sharp and blunt dissection. Portable cautery was utilized. The pocket was irrigated with sterile saline. A single lumen power injectable port was chosen for placement. The 8 Fr catheter was tunneled from the port pocket site to the venotomy incision. The port was placed in the pocket. External catheter was trimmed to appropriate length based on guidewire measurement. At the venotomy, an 8 Fr peel-away sheath was placed over a guidewire. The catheter was then placed through the sheath and the sheath removed. Final catheter positioning was confirmed and  documented with a fluoroscopic spot image. The port was accessed with a needle and aspirated and flushed with heparinized saline. The access needle was removed. The venotomy and port pocket incisions were closed with subcutaneous 3-0 Monocryl and subcuticular 4-0 Vicryl. Dermabond was applied to both incisions. COMPLICATIONS: COMPLICATIONS None FINDINGS: After catheter placement, the tip lies at the cavo-atrial junction. The catheter aspirates normally and is ready for immediate use. IMPRESSION: Placement of single lumen port a cath via left internal jugular vein. The catheter tip lies at the cavo-atrial junction. A power injectable port a cath was placed and is ready for immediate use. Electronically Signed   By: Aletta Edouard M.D.   On: 03/02/2017 15:18     Assessment and plan- Patient is a 71 y.o. 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 I will dose reduce cisplatin to 60 milligrams per meter square given her age and concern for acute kidney injury. Baseline kidney functions are normal. She will come back to [place] for day 2 and day 3 of etoposide tomorrow and day after. She starts radiation therapy on 03/07/2017. We are working on [employer] for her antinausea medications as she found out that they required pre auth yesterday.  I will see her back in 1 week to see how she is tolerating her chemo. Weekly cbc for next 3 weeks. No neulasta given concurrent RT     Visit Diagnosis 1. Small cell lung cancer, right upper lobe ([place])   2. Encounter for antineoplastic chemotherapy      Dr. Randa Evens, MD, MPH [facility] Pager- [phone] 03/05/2017 9:26 AM

## 2017-03-05 NOTE — Progress Notes (Signed)
Pharmacist reported that we do not have Mannitol for the pre and post hydration.  MD made aware and agreed for the pharmacist to alter the order for the hydration fluids.  Today's labs:  BUN 24 and Creatinine 0.74.

## 2017-03-05 NOTE — Patient Instructions (Signed)
Etoposide, VP-16 injection What is this medicine? ETOPOSIDE, VP-16 (e toe POE side) is a chemotherapy drug. It is used to treat testicular cancer, lung cancer, and other cancers. This medicine may be used for other purposes; ask your health care provider or pharmacist if you have questions. COMMON BRAND NAME(S): Etopophos, Toposar, VePesid What should I tell my health care provider before I take this medicine? They need to know if you have any of these conditions: -infection -kidney disease -liver disease -low blood counts, like low white cell, platelet, or red cell counts -an unusual or allergic reaction to etoposide, other medicines, foods, dyes, or preservatives -pregnant or trying to get pregnant -breast-feeding How should I use this medicine? This medicine is for infusion into a vein. It is administered in a hospital or clinic by a specially trained health care professional. Talk to your pediatrician regarding the use of this medicine in children. Special care may be needed. Overdosage: If you think you have taken too much of this medicine contact a poison control center or emergency room at once. NOTE: This medicine is only for you. Do not share this medicine with others. What if I miss a dose? It is important not to miss your dose. Call your doctor or health care professional if you are unable to keep an appointment. What may interact with this medicine? -aspirin -certain medications for seizures like carbamazepine, phenobarbital, phenytoin, valproic acid -cyclosporine -levamisole -warfarin This list may not describe all possible interactions. Give your health care provider a list of all the medicines, herbs, non-prescription drugs, or dietary supplements you use. Also tell them if you smoke, drink alcohol, or use illegal drugs. Some items may interact with your medicine. What should I watch for while using this medicine? Visit your doctor for checks on your progress. This drug  may make you feel generally unwell. This is not uncommon, as chemotherapy can affect healthy cells as well as cancer cells. Report any side effects. Continue your course of treatment even though you feel ill unless your doctor tells you to stop. In some cases, you may be given additional medicines to help with side effects. Follow all directions for their use. Call your doctor or health care professional for advice if you get a fever, chills or sore throat, or other symptoms of a cold or flu. Do not treat yourself. This drug decreases your body's ability to fight infections. Try to avoid being around people who are sick. This medicine may increase your risk to bruise or bleed. Call your doctor or health care professional if you notice any unusual bleeding. Talk to your doctor about your risk of cancer. You may be more at risk for certain types of cancers if you take this medicine. Do not become pregnant while taking this medicine or for at least 6 months after stopping it. Women should inform their doctor if they wish to become pregnant or think they might be pregnant. Women of child-bearing potential will need to have a negative pregnancy test before starting this medicine. There is a potential for serious side effects to an unborn child. Talk to your health care professional or pharmacist for more information. Do not breast-feed an infant while taking this medicine. Men must use a latex condom during sexual contact with a woman while taking this medicine and for at least 4 months after stopping it. A latex condom is needed even if you have had a vasectomy. Contact your doctor right away if your partner becomes pregnant. Do   not donate sperm while taking this medicine and for at least 4 months after you stop taking this medicine. Men should inform their doctors if they wish to father a child. This medicine may lower sperm counts. What side effects may I notice from receiving this medicine? Side effects that  you should report to your doctor or health care professional as soon as possible: -allergic reactions like skin rash, itching or hives, swelling of the face, lips, or tongue -low blood counts - this medicine may decrease the number of white blood cells, red blood cells and platelets. You may be at increased risk for infections and bleeding. -signs of infection - fever or chills, cough, sore throat, pain or difficulty passing urine -signs of decreased platelets or bleeding - bruising, pinpoint red spots on the skin, black, tarry stools, blood in the urine -signs of decreased red blood cells - unusually weak or tired, fainting spells, lightheadedness -breathing problems -changes in vision -mouth or throat sores or ulcers -pain, redness, swelling or irritation at the injection site -pain, tingling, numbness in the hands or feet -redness, blistering, peeling or loosening of the skin, including inside the mouth -seizures -vomiting Side effects that usually do not require medical attention (report to your doctor or health care professional if they continue or are bothersome): -diarrhea -hair loss -loss of appetite -nausea -stomach pain This list may not describe all possible side effects. Call your doctor for medical advice about side effects. You may report side effects to FDA at 1-800-FDA-1088. Where should I keep my medicine? This drug is given in a hospital or clinic and will not be stored at home. NOTE: This sheet is a summary. It may not cover all possible information. If you have questions about this medicine, talk to your doctor, pharmacist, or health care provider.  2018 [employer] Standard (2015-05-07 11:53:23) Cisplatin injection What is this medicine? CISPLATIN (SIS pla tin) is a chemotherapy drug. It targets fast dividing cells, like cancer cells, and causes these cells to die. This medicine is used to treat many types of cancer like bladder, ovarian, and testicular  cancers. This medicine may be used for other purposes; ask your health care provider or pharmacist if you have questions. COMMON BRAND NAME(S): Platinol, Platinol -AQ What should I tell my health care provider before I take this medicine? They need to know if you have any of these conditions: -blood disorders -hearing problems -kidney disease -recent or ongoing radiation therapy -an unusual or allergic reaction to cisplatin, carboplatin, other chemotherapy, other medicines, foods, dyes, or preservatives -pregnant or trying to get pregnant -breast-feeding How should I use this medicine? This drug is given as an infusion into a vein. It is administered in a hospital or clinic by a specially trained health care professional. Talk to your pediatrician regarding the use of this medicine in children. Special care may be needed. Overdosage: If you think you have taken too much of this medicine contact a poison control center or emergency room at once. NOTE: This medicine is only for you. Do not share this medicine with others. What if I miss a dose? It is important not to miss a dose. Call your doctor or health care professional if you are unable to keep an appointment. What may interact with this medicine? -dofetilide -foscarnet -medicines for seizures -medicines to increase blood counts like filgrastim, pegfilgrastim, sargramostim -probenecid -pyridoxine used with altretamine -rituximab -some antibiotics like amikacin, gentamicin, neomycin, polymyxin B, streptomycin, tobramycin -sulfinpyrazone -vaccines -zalcitabine Talk to your  doctor or health care professional before taking any of these medicines: -acetaminophen -aspirin -ibuprofen -ketoprofen -naproxen This list may not describe all possible interactions. Give your health care provider a list of all the medicines, herbs, non-prescription drugs, or dietary supplements you use. Also tell them if you smoke, drink alcohol, or use  illegal drugs. Some items may interact with your medicine. What should I watch for while using this medicine? Your condition will be monitored carefully while you are receiving this medicine. You will need important blood work done while you are taking this medicine. This drug may make you feel generally unwell. This is not uncommon, as chemotherapy can affect healthy cells as well as cancer cells. Report any side effects. Continue your course of treatment even though you feel ill unless your doctor tells you to stop. In some cases, you may be given additional medicines to help with side effects. Follow all directions for their use. Call your doctor or health care professional for advice if you get a fever, chills or sore throat, or other symptoms of a cold or flu. Do not treat yourself. This drug decreases your body's ability to fight infections. Try to avoid being around people who are sick. This medicine may increase your risk to bruise or bleed. Call your doctor or health care professional if you notice any unusual bleeding. Be careful brushing and flossing your teeth or using a toothpick because you may get an infection or bleed more easily. If you have any dental work done, tell your dentist you are receiving this medicine. Avoid taking products that contain aspirin, acetaminophen, ibuprofen, naproxen, or ketoprofen unless instructed by your doctor. These medicines may hide a fever. Do not become pregnant while taking this medicine. Women should inform their doctor if they wish to become pregnant or think they might be pregnant. There is a potential for serious side effects to an unborn child. Talk to your health care professional or pharmacist for more information. Do not breast-feed an infant while taking this medicine. Drink fluids as directed while you are taking this medicine. This will help protect your kidneys. Call your doctor or health care professional if you get diarrhea. Do not treat  yourself. What side effects may I notice from receiving this medicine? Side effects that you should report to your doctor or health care professional as soon as possible: -allergic reactions like skin rash, itching or hives, swelling of the face, lips, or tongue -signs of infection - fever or chills, cough, sore throat, pain or difficulty passing urine -signs of decreased platelets or bleeding - bruising, pinpoint red spots on the skin, black, tarry stools, nosebleeds -signs of decreased red blood cells - unusually weak or tired, fainting spells, lightheadedness -breathing problems -changes in hearing -gout pain -low blood counts - This drug may decrease the number of white blood cells, red blood cells and platelets. You may be at increased risk for infections and bleeding. -nausea and vomiting -pain, swelling, redness or irritation at the injection site -pain, tingling, numbness in the hands or feet -problems with balance, movement -trouble passing urine or change in the amount of urine Side effects that usually do not require medical attention (report to your doctor or health care professional if they continue or are bothersome): -changes in vision -loss of appetite -metallic taste in the mouth or changes in taste This list may not describe all possible side effects. Call your doctor for medical advice about side effects. You may report side effects to  FDA at 1-800-FDA-1088. Where should I keep my medicine? This drug is given in a hospital or clinic and will not be stored at home. NOTE: This sheet is a summary. It may not cover all possible information. If you have questions about this medicine, talk to your doctor, pharmacist, or health care provider.  2018 [employer]/Gold Standard (2007-08-20 14:40:54)

## 2017-03-05 NOTE — Progress Notes (Signed)
First day of chemo for pt. Pt went to get her premeds and meds for nausea and was told they all have to authorized. She does not have any today

## 2017-03-06 ENCOUNTER — Inpatient Hospital Stay: Payer: Medicare Other

## 2017-03-06 VITALS — BP 129/73 | HR 58 | Temp 96.3°F | Resp 18

## 2017-03-06 DIAGNOSIS — Z5111 Encounter for antineoplastic chemotherapy: Secondary | ICD-10-CM | POA: Diagnosis not present

## 2017-03-06 DIAGNOSIS — C3411 Malignant neoplasm of upper lobe, right bronchus or lung: Secondary | ICD-10-CM

## 2017-03-06 MED ORDER — SODIUM CHLORIDE 0.9 % IV SOLN
100.0000 mg/m2 | Freq: Once | INTRAVENOUS | Status: AC
  Administered 2017-03-06: 170 mg via INTRAVENOUS
  Filled 2017-03-06: qty 8.5

## 2017-03-06 MED ORDER — SODIUM CHLORIDE 0.9% FLUSH
10.0000 mL | INTRAVENOUS | Status: DC | PRN
  Administered 2017-03-06: 10 mL
  Filled 2017-03-06: qty 10

## 2017-03-06 MED ORDER — SODIUM CHLORIDE 0.9 % IV SOLN: 10.0000 mg | Freq: Once | INTRAVENOUS | Status: DC

## 2017-03-06 MED ORDER — DEXAMETHASONE SODIUM PHOSPHATE 10 MG/ML IJ SOLN
10.0000 mg | Freq: Once | INTRAMUSCULAR | Status: AC
  Administered 2017-03-06: 10 mg via INTRAVENOUS
  Filled 2017-03-06: qty 1

## 2017-03-06 MED ORDER — SODIUM CHLORIDE 0.9 % IV SOLN
Freq: Once | INTRAVENOUS | Status: AC
  Administered 2017-03-06: 14:00:00 via INTRAVENOUS
  Filled 2017-03-06: qty 1000

## 2017-03-06 MED ORDER — HEPARIN SOD (PORK) LOCK FLUSH 100 UNIT/ML IV SOLN
500.0000 [IU] | Freq: Once | INTRAVENOUS | Status: AC | PRN
  Administered 2017-03-06: 500 [IU]
  Filled 2017-03-06: qty 5

## 2017-03-06 NOTE — Patient Instructions (Signed)
Etoposide, VP-16 injection What is this medicine? ETOPOSIDE, VP-16 (e toe POE side) is a chemotherapy drug. It is used to treat testicular cancer, lung cancer, and other cancers. This medicine may be used for other purposes; ask your health care provider or pharmacist if you have questions. COMMON BRAND NAME(S): Etopophos, Toposar, VePesid What should I tell my health care provider before I take this medicine? They need to know if you have any of these conditions: -infection -kidney disease -liver disease -low blood counts, like low white cell, platelet, or red cell counts -an unusual or allergic reaction to etoposide, other medicines, foods, dyes, or preservatives -pregnant or trying to get pregnant -breast-feeding How should I use this medicine? This medicine is for infusion into a vein. It is administered in a hospital or clinic by a specially trained health care professional. Talk to your pediatrician regarding the use of this medicine in children. Special care may be needed. Overdosage: If you think you have taken too much of this medicine contact a poison control center or emergency room at once. NOTE: This medicine is only for you. Do not share this medicine with others. What if I miss a dose? It is important not to miss your dose. Call your doctor or health care professional if you are unable to keep an appointment. What may interact with this medicine? -aspirin -certain medications for seizures like carbamazepine, phenobarbital, phenytoin, valproic acid -cyclosporine -levamisole -warfarin This list may not describe all possible interactions. Give your health care provider a list of all the medicines, herbs, non-prescription drugs, or dietary supplements you use. Also tell them if you smoke, drink alcohol, or use illegal drugs. Some items may interact with your medicine. What should I watch for while using this medicine? Visit your doctor for checks on your progress. This drug  may make you feel generally unwell. This is not uncommon, as chemotherapy can affect healthy cells as well as cancer cells. Report any side effects. Continue your course of treatment even though you feel ill unless your doctor tells you to stop. In some cases, you may be given additional medicines to help with side effects. Follow all directions for their use. Call your doctor or health care professional for advice if you get a fever, chills or sore throat, or other symptoms of a cold or flu. Do not treat yourself. This drug decreases your body's ability to fight infections. Try to avoid being around people who are sick. This medicine may increase your risk to bruise or bleed. Call your doctor or health care professional if you notice any unusual bleeding. Talk to your doctor about your risk of cancer. You may be more at risk for certain types of cancers if you take this medicine. Do not become pregnant while taking this medicine or for at least 6 months after stopping it. Women should inform their doctor if they wish to become pregnant or think they might be pregnant. Women of child-bearing potential will need to have a negative pregnancy test before starting this medicine. There is a potential for serious side effects to an unborn child. Talk to your health care professional or pharmacist for more information. Do not breast-feed an infant while taking this medicine. Men must use a latex condom during sexual contact with a woman while taking this medicine and for at least 4 months after stopping it. A latex condom is needed even if you have had a vasectomy. Contact your doctor right away if your partner becomes pregnant. Do   not donate sperm while taking this medicine and for at least 4 months after you stop taking this medicine. Men should inform their doctors if they wish to father a child. This medicine may lower sperm counts. What side effects may I notice from receiving this medicine? Side effects that  you should report to your doctor or health care professional as soon as possible: -allergic reactions like skin rash, itching or hives, swelling of the face, lips, or tongue -low blood counts - this medicine may decrease the number of white blood cells, red blood cells and platelets. You may be at increased risk for infections and bleeding. -signs of infection - fever or chills, cough, sore throat, pain or difficulty passing urine -signs of decreased platelets or bleeding - bruising, pinpoint red spots on the skin, black, tarry stools, blood in the urine -signs of decreased red blood cells - unusually weak or tired, fainting spells, lightheadedness -breathing problems -changes in vision -mouth or throat sores or ulcers -pain, redness, swelling or irritation at the injection site -pain, tingling, numbness in the hands or feet -redness, blistering, peeling or loosening of the skin, including inside the mouth -seizures -vomiting Side effects that usually do not require medical attention (report to your doctor or health care professional if they continue or are bothersome): -diarrhea -hair loss -loss of appetite -nausea -stomach pain This list may not describe all possible side effects. Call your doctor for medical advice about side effects. You may report side effects to FDA at 1-800-FDA-1088. Where should I keep my medicine? This drug is given in a hospital or clinic and will not be stored at home. NOTE: This sheet is a summary. It may not cover all possible information. If you have questions about this medicine, talk to your doctor, pharmacist, or health care provider.  2018 [employer] Standard (2015-05-07 11:53:23)  

## 2017-03-07 ENCOUNTER — Ambulatory Visit
Admission: RE | Admit: 2017-03-07 | Discharge: 2017-03-07 | Disposition: A | Discharge status: Home / Self Care | Payer: Medicare Other | Source: Ambulatory Visit | Attending: Radiation Oncology | Admitting: Radiation Oncology

## 2017-03-07 ENCOUNTER — Telehealth: Payer: Self-pay | Admitting: *Deleted

## 2017-03-07 ENCOUNTER — Inpatient Hospital Stay: Payer: Medicare Other

## 2017-03-07 VITALS — BP 132/70 | HR 55 | Temp 96.4°F | Resp 18

## 2017-03-07 DIAGNOSIS — C3411 Malignant neoplasm of upper lobe, right bronchus or lung: Secondary | ICD-10-CM

## 2017-03-07 DIAGNOSIS — Z5111 Encounter for antineoplastic chemotherapy: Secondary | ICD-10-CM | POA: Diagnosis not present

## 2017-03-07 MED ORDER — DEXAMETHASONE SODIUM PHOSPHATE 10 MG/ML IJ SOLN
10.0000 mg | Freq: Once | INTRAMUSCULAR | Status: AC
  Administered 2017-03-07: 10 mg via INTRAVENOUS

## 2017-03-07 MED ORDER — HEPARIN SOD (PORK) LOCK FLUSH 100 UNIT/ML IV SOLN
500.0000 [IU] | Freq: Once | INTRAVENOUS | Status: AC | PRN
  Administered 2017-03-07: 500 [IU]
  Filled 2017-03-07: qty 5

## 2017-03-07 MED ORDER — SODIUM CHLORIDE 0.9 % IV SOLN
100.0000 mg/m2 | Freq: Once | INTRAVENOUS | Status: AC
  Administered 2017-03-07: 170 mg via INTRAVENOUS
  Filled 2017-03-07: qty 8.5

## 2017-03-07 MED ORDER — SODIUM CHLORIDE 0.9 % IV SOLN
Freq: Once | INTRAVENOUS | Status: AC
  Administered 2017-03-07: 14:00:00 via INTRAVENOUS
  Filled 2017-03-07: qty 1000

## 2017-03-07 MED ORDER — DEXAMETHASONE SODIUM PHOSPHATE 10 MG/ML IJ SOLN
INTRAMUSCULAR | Status: AC
  Filled 2017-03-07: qty 1

## 2017-03-07 NOTE — Telephone Encounter (Signed)
I spoke to Dr. Janese Banks ans she said to have pt try robitussin over the counter tonight and in am and if no help then call back and we can give her hycodan tom. If the OTC does not work. Pt. Is agreeable

## 2017-03-07 NOTE — Telephone Encounter (Signed)
-----   Message from Mayer Camel, RN sent at 03/07/2017  1:51 PM EDT ----- Patient requesting MD to call her in some cough medicine. Her sputum is clear, no blood. Cough is annoying her especially when she lies down. No dyspnea noted in clinic today. VSS No fever.

## 2017-03-08 ENCOUNTER — Ambulatory Visit
Admission: RE | Admit: 2017-03-08 | Discharge: 2017-03-08 | Disposition: A | Discharge status: Home / Self Care | Payer: Medicare Other | Source: Ambulatory Visit | Attending: Radiation Oncology | Admitting: Radiation Oncology

## 2017-03-08 ENCOUNTER — Telehealth: Payer: Self-pay | Admitting: *Deleted

## 2017-03-08 DIAGNOSIS — Z510 Encounter for antineoplastic radiation therapy: Secondary | ICD-10-CM | POA: Diagnosis not present

## 2017-03-08 NOTE — Telephone Encounter (Signed)
Called pt to see if the otc medication robitussin worked for her cough and she said it is doing fine. Again I told her that if it does not work then she can call back and we can get her hycodan. She will let us know if she needs it.

## 2017-03-09 ENCOUNTER — Ambulatory Visit
Admission: RE | Admit: 2017-03-09 | Discharge: 2017-03-09 | Disposition: A | Discharge status: Home / Self Care | Payer: Medicare Other | Source: Ambulatory Visit | Attending: Radiation Oncology | Admitting: Radiation Oncology

## 2017-03-09 DIAGNOSIS — Z510 Encounter for antineoplastic radiation therapy: Secondary | ICD-10-CM | POA: Diagnosis not present

## 2017-03-12 ENCOUNTER — Ambulatory Visit
Admission: RE | Admit: 2017-03-12 | Discharge: 2017-03-12 | Disposition: A | Discharge status: Home / Self Care | Payer: Medicare Other | Source: Ambulatory Visit | Attending: Radiation Oncology | Admitting: Radiation Oncology

## 2017-03-12 DIAGNOSIS — Z510 Encounter for antineoplastic radiation therapy: Secondary | ICD-10-CM | POA: Diagnosis not present

## 2017-03-13 ENCOUNTER — Inpatient Hospital Stay (HOSPITAL_BASED_OUTPATIENT_CLINIC_OR_DEPARTMENT_OTHER): Payer: Medicare Other | Admitting: Oncology

## 2017-03-13 ENCOUNTER — Ambulatory Visit
Admission: RE | Admit: 2017-03-13 | Discharge: 2017-03-13 | Disposition: A | Discharge status: Home / Self Care | Payer: Medicare Other | Source: Ambulatory Visit | Attending: Radiation Oncology | Admitting: Radiation Oncology

## 2017-03-13 ENCOUNTER — Inpatient Hospital Stay: Payer: Medicare Other

## 2017-03-13 VITALS — BP 105/68 | Wt 141.2 lb

## 2017-03-13 DIAGNOSIS — C3411 Malignant neoplasm of upper lobe, right bronchus or lung: Secondary | ICD-10-CM

## 2017-03-13 DIAGNOSIS — K5900 Constipation, unspecified: Secondary | ICD-10-CM

## 2017-03-13 DIAGNOSIS — F17210 Nicotine dependence, cigarettes, uncomplicated: Secondary | ICD-10-CM

## 2017-03-13 DIAGNOSIS — T402X5A Adverse effect of other opioids, initial encounter: Secondary | ICD-10-CM

## 2017-03-13 DIAGNOSIS — Z5111 Encounter for antineoplastic chemotherapy: Secondary | ICD-10-CM

## 2017-03-13 DIAGNOSIS — Z9221 Personal history of antineoplastic chemotherapy: Secondary | ICD-10-CM | POA: Diagnosis not present

## 2017-03-13 DIAGNOSIS — Z9012 Acquired absence of left breast and nipple: Secondary | ICD-10-CM

## 2017-03-13 DIAGNOSIS — Z79899 Other long term (current) drug therapy: Secondary | ICD-10-CM

## 2017-03-13 DIAGNOSIS — Z510 Encounter for antineoplastic radiation therapy: Secondary | ICD-10-CM | POA: Diagnosis not present

## 2017-03-13 DIAGNOSIS — Z853 Personal history of malignant neoplasm of breast: Secondary | ICD-10-CM

## 2017-03-13 LAB — COMPREHENSIVE METABOLIC PANEL
ALBUMIN: 3.3 g/dL — AB (ref 3.5–5.0)
ALT: 50 U/L (ref 14–54)
AST: 20 U/L (ref 15–41)
Alkaline Phosphatase: 88 U/L (ref 38–126)
Anion gap: 10 (ref 5–15)
BUN: 21 mg/dL — ABNORMAL HIGH (ref 6–20)
CHLORIDE: 99 mmol/L — AB (ref 101–111)
CO2: 26 mmol/L (ref 22–32)
Calcium: 8.7 mg/dL — ABNORMAL LOW (ref 8.9–10.3)
Creatinine, Ser: 0.8 mg/dL (ref 0.44–1.00)
Glucose, Bld: 130 mg/dL — ABNORMAL HIGH (ref 65–99)
POTASSIUM: 4.2 mmol/L (ref 3.5–5.1)
SODIUM: 135 mmol/L (ref 135–145)
Total Bilirubin: 0.6 mg/dL (ref 0.3–1.2)
Total Protein: 6.4 g/dL — ABNORMAL LOW (ref 6.5–8.1)

## 2017-03-13 MED ORDER — OXYCODONE HCL 5 MG PO TABS: 5.0000 mg | ORAL_TABLET | Freq: Four times a day (QID) | ORAL | 0 refills | Status: DC | PRN

## 2017-03-13 MED ORDER — OXYCODONE HCL 5 MG PO TABS
5.0000 mg | ORAL_TABLET | Freq: Four times a day (QID) | ORAL | 0 refills | Status: DC | PRN
Start: 2017-03-13 — End: 2017-10-30

## 2017-03-13 NOTE — Progress Notes (Signed)
Here for follow up. BP 105/68 -pt denies light headedness today/. Stated light headed feeling over week end on standing. Encouraged to drink enough fluids . Sister [name] stated she too reminds pt to drink fluids .

## 2017-03-13 NOTE — Progress Notes (Signed)
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 Patient is a 71 year old female with a long-standing history of smoking and smoked 1 pack per day for more than 50 years and is now down to half a pack per day. She also has COPD and recently had abdominal aortic aneurysm status post stenting. She presented with symptoms of mild hemoptysis in August 2018 which prompted CT chest  2. CT chest on 01/23/2017 showed:IMPRESSION: 1. Large right suprahilar mass extending into the mediastinum consistent with primary lung carcinoma with mediastinal extension. 2. Partial atelectasis of right upper lobe due to this large suprahilar mass with probable postobstructive pneumonitis in the right upper lobe peripherally. 3. Small right pleural effusion. 4. Severe thoracic aortic atherosclerosis. Diffuse coronary artery Calcifications.   3. This was followed by a PET CT scan on 02/07/2017 showed:IMPRESSION: 1. Hypermetabolic disease in the upper right hilum and mediastinum compatible with neoplasm. 2. Mild FDG accumulation in peripheral areas of patchy airspace disease which may be secondary sites of neoplasm or related to infection/inflammation. 3. No evidence for distant metastases in the neck, abdomen, or pelvis.   4. Patient underwent bronchoscopy guided biopsy with Dr. Mortimer Fries. Pathology showed small cell lung cancer. MRI brain negative for metastatic disease  5. Patient lives with her  sister and is independent of her ADLs. She reports occasional blood-tinged sputum but no significant frank hemoptysis. Her appetite is good and she denies any unintentional weight loss. She denies any pain. She does have a history of breast cancer in 2002 status post left mastectomy followed by adjuvant chemotherapy. This was treated by Dr. Oliva Bustard. She did not receive any adjuvant radiation.  6. Cycle 1 of cis/etoposide started on 03/05/17  Interval history- reports feeling fatigued and spends time napping and resting. But does walk around the house and does her chores. Denies any nausea/ vomiting. Reports some right chest wall pain radiating to her back. Tylenol has not helping her as much. Cough improved with robitussin  ECOG PS- 1 Pain scale- 0 Opioid associated constipation- no  Review of systems- Review of Systems  Constitutional: Positive for malaise/fatigue. Negative for chills, fever and weight loss.  HENT: Negative for congestion, ear discharge and nosebleeds.   Eyes: Negative for blurred vision.  Respiratory: Positive for cough. Negative for hemoptysis, sputum production, shortness of breath and wheezing.   Cardiovascular: Negative for chest pain, palpitations, orthopnea and claudication.       Chest wall pain  Gastrointestinal: Negative for abdominal pain, blood in stool, constipation, diarrhea, heartburn, melena, nausea and vomiting.  Genitourinary: Negative for dysuria, flank pain, frequency, hematuria and urgency.  Musculoskeletal: Negative for back pain, joint pain and myalgias.  Skin: Negative for rash.  Neurological: Negative for dizziness, tingling, focal weakness, seizures, weakness and headaches.  Endo/Heme/Allergies: Does not bruise/bleed easily.  Psychiatric/Behavioral: Negative for depression and suicidal ideas. The patient does not have insomnia.        No Known Allergies   Past Medical History:  Diagnosis Date  . AAA (abdominal aortic aneurysm) ([place])   .  Breast cancer ([place]) 2002   left  . COPD (chronic obstructive pulmonary disease) ([place])   . High cholesterol   . Personal history of chemotherapy   . Small cell lung cancer ([place])   . Tobacco abuse      Past Surgical History:  Procedure Laterality Date  . ABDOMINAL HYSTERECTOMY    . ENDOBRONCHIAL ULTRASOUND N/A 02/19/2017   Procedure: ENDOBRONCHIAL ULTRASOUND;  Surgeon: Flora Lipps, MD;  Location: [facility] OR[facility];  Service: Cardiopulmonary;  Laterality: N/A;  . ENDOVASCULAR REPAIR/STENT GRAFT N/A 12/06/2016   Procedure: Endovascular Repair/Stent Graft;  Surgeon: Katha Cabal, MD;  Location: [facility];  Service: Cardiovascular;  Laterality: N/A;  . IR FLUORO GUIDE PORT INSERTION RIGHT  03/02/2017  . MASTECTOMY Left 2002   with sentinel node     Social History   Social History  . Marital status: Widowed    Spouse name: N/A  . Number of children: N/A  . Years of education: N/A   Occupational History  . Not on file.   Social History Main Topics  . Smoking status: Current Every Day Smoker    Packs/day: 0.50    Types: Cigarettes  . Smokeless tobacco: Never Used  . Alcohol use No  . Drug use: No  . Sexual activity: No   Other Topics Concern  . Not on file   Social History Narrative  . No narrative on file    Family History  Problem Relation Age of Onset  . Breast cancer Neg Hx      Current Outpatient Prescriptions:  .  albuterol (PROVENTIL HFA;VENTOLIN HFA) 108 (90 Base) MCG/ACT inhaler, Inhale 2 puffs into the lungs every 6 (six) hours as needed for wheezing or shortness of breath., Disp: 1 Inhaler, Rfl: 1 .  aspirin EC 81 MG tablet, Take 81 mg by mouth daily., Disp: , Rfl:  .  atorvastatin (LIPITOR) 10 MG tablet, Take 10 mg by mouth at bedtime. , Disp: , Rfl:  .  dexamethasone (DECADRON) 4 MG tablet, Take 2 tablets two times a day for 1 day on day 4 after cisplatin chemotherapy. Take with food., Disp: 30 tablet, Rfl: 1 .  fluticasone-salmeterol (ADVAIR HFA)  115-21 MCG/ACT inhaler, Inhale 2 puffs into the lungs 2 (two) times daily., Disp: , Rfl:  .  ipratropium (ATROVENT) 0.02 % nebulizer solution, Take 0.5 mg by nebulization 4 (four) times daily., Disp: , Rfl:  .  ondansetron (ZOFRAN) 8 MG tablet, Take 1 tablet (8 mg total) by mouth 2 (two) times daily as needed. Start on the third day after cisplatin chemotherapy., Disp: 30 tablet, Rfl: 1 .  acetaminophen (TYLENOL) 500 MG tablet, Take 1,000 mg by mouth every 6 (six) hours as needed (for pain.)., Disp: , Rfl:  .  lidocaine-prilocaine (EMLA) cream, Apply to affected area once (Patient not taking: Reported on 03/13/2017), Disp: 30 g, Rfl: 3 .  LORazepam (ATIVAN) 0.5 MG tablet, Take 1 tablet (0.5 mg total) by mouth every 6 (six) hours as needed (Nausea or vomiting). (Patient not taking: Reported on 03/13/2017), Disp: 30 tablet, Rfl: 0 .  oxyCODONE (OXY IR/ROXICODONE) 5 MG immediate release tablet, Take 1 tablet (5 mg total) by mouth every 6 (six) hours as needed for severe pain., Disp: 30 tablet, Rfl: 0 .  prochlorperazine (COMPAZINE) 10 MG tablet, Take 1 tablet (10 mg total) by mouth every 6 (six) hours as needed (Nausea or vomiting). (Patient not taking: Reported on 03/02/2017), Disp: 30 tablet, Rfl: 1  Physical exam:  Vitals:  03/13/17 1008  BP: 105/68  TempSrc: Tympanic  Weight: 141 lb 3.2 oz (64 kg)   Physical Exam  Constitutional: She is oriented to person, place, and time and well-developed, well-nourished, and in no distress.  HENT:  Head: Normocephalic and atraumatic.  Eyes: Pupils are equal, round, and reactive to light. EOM are normal.  Neck: Normal range of motion.  Cardiovascular: Normal rate, regular rhythm and normal heart sounds.   Pulmonary/Chest: Effort normal and breath sounds normal.  Abdominal: Soft. Bowel sounds are normal.  Neurological: She is alert and oriented to person, place, and time.  Skin: Skin is warm and dry.     CMP Latest Ref Rng & Units 03/13/2017  Glucose  65 - 99 mg/dL 130(H)  BUN 6 - 20 mg/dL 21(H)  Creatinine 0.44 - 1.00 mg/dL 0.80  Sodium 135 - 145 mmol/L 135  Potassium 3.5 - 5.1 mmol/L 4.2  Chloride 101 - 111 mmol/L 99(L)  CO2 22 - 32 mmol/L 26  Calcium 8.9 - 10.3 mg/dL 8.7(L)  Total Protein 6.5 - 8.1 g/dL 6.4(L)  Total Bilirubin 0.3 - 1.2 mg/dL 0.6  Alkaline Phos 38 - 126 U/L 88  AST 15 - 41 U/L 20  ALT 14 - 54 U/L 50   CBC Latest Ref Rng & Units 03/05/2017  WBC 3.6 - 11.0 K/uL 10.4  Hemoglobin 12.0 - 16.0 g/dL 12.2  Hematocrit 35.0 - 47.0 % 36.8  Platelets 150 - 440 K/uL 282    No images are attached to the encounter.  Mr Jeri Cos Wo Contrast  Result Date: 02/21/2017 CLINICAL DATA:  Recently diagnosed lung cancer with right lung mass. Evaluation for brain metastases. EXAM: MRI HEAD WITHOUT AND WITH CONTRAST TECHNIQUE: Multiplanar, multiecho pulse sequences of the brain and surrounding structures were obtained without and with intravenous contrast. CONTRAST:  69mL MULTIHANCE GADOBENATE DIMEGLUMINE 529 MG/ML IV SOLN COMPARISON:  Head CT 06/08/2007 FINDINGS: Brain: There is no evidence of acute infarct, intracranial hemorrhage, mass, midline shift, or extra-axial fluid collection. The ventricles and sulci are within normal limits for age. Small foci of T2 hyperintensity scattered throughout the subcortical and deep cerebral white matter are nonspecific but compatible with mild-to-moderate chronic small vessel ischemic disease. Small chronic cortical/subcortical infarcts are noted in the left parietal lobe. No abnormal enhancement is identified. Vascular: Major intracranial vascular flow voids are preserved. Skull and upper cervical spine: Unremarkable bone marrow signal. Mild cervical disc and facet degeneration. Sinuses/Orbits: Bilateral cataract extraction. Mild posterior right ethmoid air cell mucosal thickening. Trace mastoid fluid bilaterally. Other: None. IMPRESSION: 1. No evidence of intracranial metastases. 2. Mild-to-moderate  chronic small vessel ischemic disease. Electronically Signed   By: Logan Bores M.D.   On: 02/21/2017 13:24   Ir Fluoro Guide Port Insertion Right  Result Date: 03/02/2017 CLINICAL DATA:  Right-sided small cell lung carcinoma and need for porta cath to start chemotherapy. EXAM: IMPLANTED PORT A CATH PLACEMENT WITH ULTRASOUND AND FLUOROSCOPIC GUIDANCE ANESTHESIA/SEDATION: 2.0 mg IV Versed; 100 mcg IV Fentanyl Total Moderate Sedation Time:  37 minutes The patient's level of consciousness and physiologic status were continuously monitored during the procedure by Radiology nursing. Additional Medications: 2 g IV Ancef. FLUOROSCOPY TIME:  18 seconds.  2.0 mGy. PROCEDURE: The procedure, risks, benefits, and alternatives were explained to the patient. Questions regarding the procedure were encouraged and answered. The patient understands and consents to the procedure. A time-out was performed prior to initiating the procedure. Ultrasound was utilized to confirm patency of the left internal jugular vein.  The left neck and chest were prepped with chlorhexidine in a sterile fashion, and a sterile drape was applied covering the operative field. Maximum barrier sterile technique with sterile gowns and gloves were used for the procedure. Local anesthesia was provided with 1% lidocaine. After creating a small venotomy incision, a 21 gauge needle was advanced into the left internal jugular vein under direct, real-time ultrasound guidance. Ultrasound image documentation was performed. After securing guidewire access, an 8 Fr dilator was placed. A J-wire was kinked to measure appropriate catheter length. A subcutaneous port pocket was then created along the upper chest wall utilizing sharp and blunt dissection. Portable cautery was utilized. The pocket was irrigated with sterile saline. A single lumen power injectable port was chosen for placement. The 8 Fr catheter was tunneled from the port pocket site to the venotomy  incision. The port was placed in the pocket. External catheter was trimmed to appropriate length based on guidewire measurement. At the venotomy, an 8 Fr peel-away sheath was placed over a guidewire. The catheter was then placed through the sheath and the sheath removed. Final catheter positioning was confirmed and documented with a fluoroscopic spot image. The port was accessed with a needle and aspirated and flushed with heparinized saline. The access needle was removed. The venotomy and port pocket incisions were closed with subcutaneous 3-0 Monocryl and subcuticular 4-0 Vicryl. Dermabond was applied to both incisions. COMPLICATIONS: COMPLICATIONS None FINDINGS: After catheter placement, the tip lies at the cavo-atrial junction. The catheter aspirates normally and is ready for immediate use. IMPRESSION: Placement of single lumen port a cath via left internal jugular vein. The catheter tip lies at the cavo-atrial junction. A power injectable port a cath was placed and is ready for immediate use. Electronically Signed   By: Aletta Edouard M.D.   On: 03/02/2017 15:18     Assessment and plan- Patient is a 71 y.o. 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 Will prescribe prn oxycodone 5 mg Q6 prn for neoplasm associated pain. She will senna for opioid associated constipation. Repeat cbc next week. I will see her in 2 weeks at [facility] with cbc, cmp for cycle 2 of cisplatin/ etoposide chemotherapy    Visit Diagnosis 1. Small cell lung cancer, right upper lobe ([place])      Dr. Randa Evens, MD, MPH [place] [facility] Pager- [phone] 03/13/2017 1:24 PM

## 2017-03-14 ENCOUNTER — Ambulatory Visit
Admission: RE | Admit: 2017-03-14 | Discharge: 2017-03-14 | Disposition: A | Discharge status: Home / Self Care | Payer: Medicare Other | Source: Ambulatory Visit | Attending: Radiation Oncology | Admitting: Radiation Oncology

## 2017-03-14 DIAGNOSIS — Z510 Encounter for antineoplastic radiation therapy: Secondary | ICD-10-CM | POA: Diagnosis not present

## 2017-03-15 ENCOUNTER — Ambulatory Visit
Admission: RE | Admit: 2017-03-15 | Discharge: 2017-03-15 | Disposition: A | Discharge status: Home / Self Care | Payer: Medicare Other | Source: Ambulatory Visit | Attending: Radiation Oncology | Admitting: Radiation Oncology

## 2017-03-15 DIAGNOSIS — Z510 Encounter for antineoplastic radiation therapy: Secondary | ICD-10-CM | POA: Diagnosis not present

## 2017-03-16 ENCOUNTER — Ambulatory Visit
Admission: RE | Admit: 2017-03-16 | Discharge: 2017-03-16 | Disposition: A | Discharge status: Home / Self Care | Payer: Medicare Other | Source: Ambulatory Visit | Attending: Radiation Oncology | Admitting: Radiation Oncology

## 2017-03-16 DIAGNOSIS — Z510 Encounter for antineoplastic radiation therapy: Secondary | ICD-10-CM | POA: Diagnosis not present

## 2017-03-19 ENCOUNTER — Inpatient Hospital Stay: Payer: Medicare Other

## 2017-03-19 ENCOUNTER — Ambulatory Visit
Admission: RE | Admit: 2017-03-19 | Discharge: 2017-03-19 | Disposition: A | Discharge status: Home / Self Care | Payer: Medicare Other | Source: Ambulatory Visit | Attending: Radiation Oncology | Admitting: Radiation Oncology

## 2017-03-19 DIAGNOSIS — Z510 Encounter for antineoplastic radiation therapy: Secondary | ICD-10-CM | POA: Diagnosis not present

## 2017-03-19 DIAGNOSIS — Z5111 Encounter for antineoplastic chemotherapy: Secondary | ICD-10-CM | POA: Diagnosis not present

## 2017-03-19 DIAGNOSIS — C3411 Malignant neoplasm of upper lobe, right bronchus or lung: Secondary | ICD-10-CM

## 2017-03-19 LAB — CBC WITH DIFFERENTIAL/PLATELET
BAND NEUTROPHILS: 0 %
BASOS ABS: 0 10*3/uL (ref 0–0.1)
BASOS PCT: 1 %
Blasts: 0 %
EOS PCT: 1 %
Eosinophils Absolute: 0 10*3/uL (ref 0–0.7)
HCT: 38.6 % (ref 35.0–47.0)
HEMOGLOBIN: 12.6 g/dL (ref 12.0–16.0)
LYMPHS ABS: 0.9 10*3/uL — AB (ref 1.0–3.6)
LYMPHS PCT: 59 %
MCH: 28.9 pg (ref 26.0–34.0)
MCHC: 32.7 g/dL (ref 32.0–36.0)
MCV: 88.4 fL (ref 80.0–100.0)
MONO ABS: 0.4 10*3/uL (ref 0.2–0.9)
MONOS PCT: 22 %
Metamyelocytes Relative: 0 %
Myelocytes: 0 %
NEUTROS ABS: 0.3 10*3/uL — AB (ref 1.7–7.7)
Neutrophils Relative %: 17 %
Other: 0 %
PLATELETS: 178 10*3/uL (ref 150–440)
PROMYELOCYTES ABS: 0 %
RBC: 4.37 MIL/uL (ref 3.80–5.20)
RDW: 15 % — ABNORMAL HIGH (ref 11.5–14.5)
WBC: 1.6 10*3/uL — ABNORMAL LOW (ref 3.6–11.0)
nRBC: 0 /100 WBC

## 2017-03-20 ENCOUNTER — Ambulatory Visit: Payer: Medicare Other

## 2017-03-20 ENCOUNTER — Telehealth: Payer: Self-pay | Admitting: *Deleted

## 2017-03-20 NOTE — Telephone Encounter (Signed)
Discussed neutropenic precautions/ instructions with patient's family member. She verbalized understanding.   dhs

## 2017-03-21 ENCOUNTER — Ambulatory Visit
Admission: RE | Admit: 2017-03-21 | Discharge: 2017-03-21 | Disposition: A | Discharge status: Home / Self Care | Payer: Medicare Other | Source: Ambulatory Visit | Attending: Radiation Oncology | Admitting: Radiation Oncology

## 2017-03-21 ENCOUNTER — Other Ambulatory Visit (INDEPENDENT_AMBULATORY_CARE_PROVIDER_SITE_OTHER): Payer: Self-pay | Admitting: Cardiology

## 2017-03-21 DIAGNOSIS — Z510 Encounter for antineoplastic radiation therapy: Secondary | ICD-10-CM | POA: Diagnosis not present

## 2017-03-22 ENCOUNTER — Ambulatory Visit
Admission: RE | Admit: 2017-03-22 | Discharge: 2017-03-22 | Disposition: A | Discharge status: Home / Self Care | Payer: Medicare Other | Source: Ambulatory Visit | Attending: Radiation Oncology | Admitting: Radiation Oncology

## 2017-03-22 DIAGNOSIS — Z510 Encounter for antineoplastic radiation therapy: Secondary | ICD-10-CM | POA: Diagnosis not present

## 2017-03-23 ENCOUNTER — Ambulatory Visit
Admission: RE | Admit: 2017-03-23 | Discharge: 2017-03-23 | Disposition: A | Discharge status: Home / Self Care | Payer: Medicare Other | Source: Ambulatory Visit | Attending: Radiation Oncology | Admitting: Radiation Oncology

## 2017-03-23 DIAGNOSIS — Z510 Encounter for antineoplastic radiation therapy: Secondary | ICD-10-CM | POA: Diagnosis not present

## 2017-03-26 ENCOUNTER — Encounter (INDEPENDENT_AMBULATORY_CARE_PROVIDER_SITE_OTHER): Payer: Medicare Other

## 2017-03-26 ENCOUNTER — Inpatient Hospital Stay (HOSPITAL_BASED_OUTPATIENT_CLINIC_OR_DEPARTMENT_OTHER): Payer: Medicare Other | Admitting: Oncology

## 2017-03-26 ENCOUNTER — Ambulatory Visit
Admission: RE | Admit: 2017-03-26 | Discharge: 2017-03-26 | Disposition: A | Discharge status: Home / Self Care | Payer: Medicare Other | Source: Ambulatory Visit | Attending: Radiation Oncology | Admitting: Radiation Oncology

## 2017-03-26 ENCOUNTER — Encounter: Payer: Self-pay | Admitting: Oncology

## 2017-03-26 ENCOUNTER — Ambulatory Visit (INDEPENDENT_AMBULATORY_CARE_PROVIDER_SITE_OTHER): Payer: Medicare Other | Admitting: Vascular Surgery

## 2017-03-26 ENCOUNTER — Other Ambulatory Visit (INDEPENDENT_AMBULATORY_CARE_PROVIDER_SITE_OTHER): Payer: Medicare Other

## 2017-03-26 ENCOUNTER — Inpatient Hospital Stay: Payer: Medicare Other

## 2017-03-26 VITALS — BP 114/76 | HR 103 | Temp 97.0°F | Resp 16 | Wt 140.7 lb

## 2017-03-26 VITALS — BP 116/70 | HR 100 | Temp 97.0°F | Resp 18

## 2017-03-26 DIAGNOSIS — C3411 Malignant neoplasm of upper lobe, right bronchus or lung: Secondary | ICD-10-CM

## 2017-03-26 DIAGNOSIS — Z79899 Other long term (current) drug therapy: Secondary | ICD-10-CM

## 2017-03-26 DIAGNOSIS — F17210 Nicotine dependence, cigarettes, uncomplicated: Secondary | ICD-10-CM

## 2017-03-26 DIAGNOSIS — Z5111 Encounter for antineoplastic chemotherapy: Secondary | ICD-10-CM

## 2017-03-26 DIAGNOSIS — Z510 Encounter for antineoplastic radiation therapy: Secondary | ICD-10-CM | POA: Diagnosis not present

## 2017-03-26 LAB — COMPREHENSIVE METABOLIC PANEL
ALK PHOS: 93 U/L (ref 38–126)
ALT: 12 U/L — ABNORMAL LOW (ref 14–54)
ANION GAP: 11 (ref 5–15)
AST: 16 U/L (ref 15–41)
Albumin: 3.4 g/dL — ABNORMAL LOW (ref 3.5–5.0)
BILIRUBIN TOTAL: 0.6 mg/dL (ref 0.3–1.2)
BUN: 15 mg/dL (ref 6–20)
CALCIUM: 8.9 mg/dL (ref 8.9–10.3)
CO2: 24 mmol/L (ref 22–32)
Chloride: 102 mmol/L (ref 101–111)
Creatinine, Ser: 0.7 mg/dL (ref 0.44–1.00)
GFR calc Af Amer: >60 mL/min (ref >60)
GLUCOSE: 115 mg/dL — AB (ref 65–99)
POTASSIUM: 4.1 mmol/L (ref 3.5–5.1)
Sodium: 137 mmol/L (ref 135–145)
TOTAL PROTEIN: 6.8 g/dL (ref 6.5–8.1)

## 2017-03-26 LAB — CBC WITH DIFFERENTIAL/PLATELET
Basophils Absolute: 0 10*3/uL (ref 0–0.1)
Basophils Relative: 1 %
Eosinophils Absolute: 0 10*3/uL (ref 0–0.7)
Eosinophils Relative: 1 %
HEMATOCRIT: 36.4 % (ref 35.0–47.0)
Hemoglobin: 12.1 g/dL (ref 12.0–16.0)
LYMPHS ABS: 1.5 10*3/uL (ref 1.0–3.6)
LYMPHS PCT: 26 %
MCH: 29.3 pg (ref 26.0–34.0)
MCHC: 33.3 g/dL (ref 32.0–36.0)
MCV: 88 fL (ref 80.0–100.0)
MONO ABS: 0.9 10*3/uL (ref 0.2–0.9)
MONOS PCT: 14 %
NEUTROS ABS: 3.5 10*3/uL (ref 1.4–6.5)
Neutrophils Relative %: 58 %
Platelets: 475 10*3/uL — ABNORMAL HIGH (ref 150–440)
RBC: 4.13 MIL/uL (ref 3.80–5.20)
RDW: 15.5 % — AB (ref 11.5–14.5)
WBC: 6 10*3/uL (ref 3.6–11.0)

## 2017-03-26 MED ORDER — POTASSIUM CHLORIDE 2 MEQ/ML IV SOLN
Freq: Once | INTRAVENOUS | Status: AC
  Administered 2017-03-26: 11:00:00 via INTRAVENOUS
  Filled 2017-03-26: qty 1000

## 2017-03-26 MED ORDER — SODIUM CHLORIDE 0.9 % IV SOLN
Freq: Once | INTRAVENOUS | Status: AC
  Administered 2017-03-26: 11:00:00 via INTRAVENOUS
  Filled 2017-03-26: qty 1000

## 2017-03-26 MED ORDER — HEPARIN SOD (PORK) LOCK FLUSH 100 UNIT/ML IV SOLN
INTRAVENOUS | Status: AC
  Filled 2017-03-26: qty 5

## 2017-03-26 MED ORDER — SODIUM CHLORIDE 0.9 % IV SOLN
Freq: Once | INTRAVENOUS | Status: AC
  Administered 2017-03-26: 11:00:00 via INTRAVENOUS
  Filled 2017-03-26: qty 5

## 2017-03-26 MED ORDER — SODIUM CHLORIDE 0.9% FLUSH
10.0000 mL | INTRAVENOUS | Status: AC | PRN
  Administered 2017-03-26: 10 mL via INTRAVENOUS
  Filled 2017-03-26: qty 10

## 2017-03-26 MED ORDER — PALONOSETRON HCL INJECTION 0.25 MG/5ML
0.2500 mg | Freq: Once | INTRAVENOUS | Status: AC
  Administered 2017-03-26: 0.25 mg via INTRAVENOUS

## 2017-03-26 MED ORDER — CISPLATIN CHEMO INJECTION 100MG/100ML
60.0000 mg/m2 | Freq: Once | INTRAVENOUS | Status: AC
  Administered 2017-03-26: 103 mg via INTRAVENOUS
  Filled 2017-03-26: qty 103

## 2017-03-26 MED ORDER — SODIUM CHLORIDE 0.9 % IV SOLN
100.0000 mg/m2 | Freq: Once | INTRAVENOUS | Status: AC
  Administered 2017-03-26: 170 mg via INTRAVENOUS
  Filled 2017-03-26: qty 8.5

## 2017-03-26 MED ORDER — HEPARIN SOD (PORK) LOCK FLUSH 100 UNIT/ML IV SOLN
500.0000 [IU] | Freq: Once | INTRAVENOUS | Status: AC
  Administered 2017-03-26: 500 [IU] via INTRAVENOUS

## 2017-03-26 MED ORDER — PALONOSETRON HCL INJECTION 0.25 MG/5ML
INTRAVENOUS | Status: AC
  Filled 2017-03-26: qty 5

## 2017-03-26 NOTE — Progress Notes (Signed)
Patient here for follow up with labs and Cisplatin, VP-16. She states that she feels "fairly well" today and denies having any pain. She was in [place] earlier this morning for her radiation treatment. She has already received her Flu Vaccine.

## 2017-03-26 NOTE — Progress Notes (Signed)
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 Patient is a 71 year old female with a long-standing history of smoking and smoked 1 pack per day for more than 50 years and is now down to half a pack per day. She also has COPD and recently had abdominal aortic aneurysm status post stenting. She presented with symptoms of mild hemoptysis in August 2018 which prompted CT chest  2. CT chest on 01/23/2017 showed:IMPRESSION: 1. Large right suprahilar mass extending into the mediastinum consistent with primary lung carcinoma with mediastinal extension. 2. Partial atelectasis of right upper lobe due to this large suprahilar mass with probable postobstructive pneumonitis in the right upper lobe peripherally. 3. Small right pleural effusion. 4. Severe thoracic aortic atherosclerosis. Diffuse coronary artery Calcifications.   3. This was followed by a PET CT scan on 02/07/2017 showed:IMPRESSION: 1. Hypermetabolic disease in the upper right hilum and mediastinum compatible with neoplasm. 2. Mild FDG accumulation in peripheral areas of patchy airspace disease which may be secondary sites of neoplasm or related to infection/inflammation. 3. No evidence for distant metastases in the neck, abdomen, or pelvis.   4. Patient underwent bronchoscopy guided biopsy with Dr. Mortimer Fries. Pathology showed small cell lung cancer. MRI brain negative for metastatic disease  5. Patient lives  with her sister and is independent of her ADLs. She reports occasional blood-tinged sputum but no significant frank hemoptysis. Her appetite is good and she denies any unintentional weight loss. She denies any pain. She does have a history of breast cancer in 2002 status post left mastectomy followed by adjuvant chemotherapy. This was treated by Dr. Oliva Bustard. She did not receive any adjuvant radiation.  6. Cycle 1 of cis/etoposide started on 03/05/17   Interval history- she reports no pain today and has not used her oxycodone. Only uses tylenol occasionally. Appetite is good and she is still up and about. Mild nausea controlled with meds. She developed 3 blisters over b/l middle fingers 1 week ago which are slowly healing  ECOG PS- 1 Pain scale- 0 Opioid associated constipation- no  Review of systems- Review of Systems  Constitutional: Positive for malaise/fatigue. Negative for chills, fever and weight loss.  HENT: Negative for congestion, ear discharge and nosebleeds.   Eyes: Negative for blurred vision.  Respiratory: Negative for cough, hemoptysis, sputum production, shortness of breath and wheezing.   Cardiovascular: Negative for chest pain, palpitations, orthopnea and claudication.  Gastrointestinal: Positive for nausea. Negative for abdominal pain, blood in stool, constipation, diarrhea, heartburn and melena.  Genitourinary: Negative for dysuria, flank pain, frequency, hematuria and urgency.  Musculoskeletal: Negative for back pain, joint pain and myalgias.  Skin: Negative for rash.  Neurological: Negative for dizziness, tingling, focal weakness, seizures, weakness and headaches.  Endo/Heme/Allergies: Does not bruise/bleed easily.  Psychiatric/Behavioral: Negative for depression and suicidal ideas. The patient does not have insomnia.        No Known Allergies   Past Medical History:  Diagnosis Date  . AAA (abdominal aortic aneurysm) ([place])   . Breast cancer ([place]) 2002  left    . COPD (chronic obstructive pulmonary disease) ([place])   . High cholesterol   . Personal history of chemotherapy   . Small cell lung cancer ([place])   . Tobacco abuse      Past Surgical History:  Procedure Laterality Date  . ABDOMINAL HYSTERECTOMY    . ENDOBRONCHIAL ULTRASOUND N/A 02/19/2017   Procedure: ENDOBRONCHIAL ULTRASOUND;  Surgeon: Flora Lipps, MD;  Location: [facility] ORS;  Service: Cardiopulmonary;  Laterality: N/A;  . ENDOVASCULAR REPAIR/STENT GRAFT N/A 12/06/2016   Procedure: Endovascular Repair/Stent Graft;  Surgeon: Katha Cabal, MD;  Location: [place] [facility];  Service: Cardiovascular;  Laterality: N/A;  . IR FLUORO GUIDE PORT INSERTION RIGHT  03/02/2017  . MASTECTOMY Left 2002   with sentinel node     Social History   Social History  . Marital status: Widowed    Spouse name: N/A  . Number of children: N/A  . Years of education: N/A   Occupational History  . Not on file.   Social History Main Topics  . Smoking status: Current Every Day Smoker    Packs/day: 0.50    Types: Cigarettes  . Smokeless tobacco: Never Used  . Alcohol use No  . Drug use: No  . Sexual activity: No   Other Topics Concern  . Not on file   Social History Narrative  . No narrative on file    Family History  Problem Relation Age of Onset  . Breast cancer Neg Hx      Current Outpatient Prescriptions:  .  acetaminophen (TYLENOL) 500 MG tablet, Take 1,000 mg by mouth every 6 (six) hours as needed (for pain.)., Disp: , Rfl:  .  albuterol (PROVENTIL HFA;VENTOLIN HFA) 108 (90 Base) MCG/ACT inhaler, Inhale 2 puffs into the lungs every 6 (six) hours as needed for wheezing or shortness of breath., Disp: 1 Inhaler, Rfl: 1 .  aspirin EC 81 MG tablet, Take 81 mg by mouth daily., Disp: , Rfl:  .  atorvastatin (LIPITOR) 10 MG tablet, Take 10 mg by mouth at bedtime. , Disp: , Rfl:  .  dexamethasone (DECADRON) 4 MG tablet, Take 2 tablets two times a day for 1 day on day 4 after cisplatin  chemotherapy. Take with food., Disp: 30 tablet, Rfl: 1 .  fluticasone-salmeterol (ADVAIR HFA) 115-21 MCG/ACT inhaler, Inhale 2 puffs into the lungs 2 (two) times daily., Disp: , Rfl:  .  ipratropium (ATROVENT) 0.02 % nebulizer solution, Take 0.5 mg by nebulization 4 (four) times daily., Disp: , Rfl:  .  lidocaine-prilocaine (EMLA) cream, Apply to affected area once (Patient not taking: Reported on 03/13/2017), Disp: 30 g, Rfl: 3 .  LORazepam (ATIVAN) 0.5 MG tablet, Take 1 tablet (0.5 mg total) by mouth every 6 (six) hours as needed (Nausea or vomiting). (Patient not taking: Reported on 03/13/2017), Disp: 30 tablet, Rfl: 0 .  ondansetron (ZOFRAN) 8 MG tablet, Take 1 tablet (8 mg total) by mouth 2 (two) times daily as needed. Start on the third day after cisplatin chemotherapy., Disp: 30 tablet, Rfl: 1 .  oxyCODONE (OXY IR/ROXICODONE) 5 MG immediate release tablet, Take 1 tablet (5 mg total) by mouth every 6 (six) hours as needed for severe pain., Disp: 30 tablet, Rfl: 0 .  prochlorperazine (COMPAZINE) 10 MG tablet, Take 1 tablet (10 mg total) by mouth every 6 (six) hours as needed (Nausea or vomiting). (Patient not taking: Reported on 03/02/2017), Disp: 30 tablet, Rfl: 1 No current facility-administered medications for this visit.  Facility-Administered Medications Ordered in Other Visits:  .  CISplatin (PLATINOL) 103 mg in sodium chloride 0.9 % 500 mL chemo infusion, 60 mg/m2 (Treatment Plan Recorded), Intravenous, Once, Sindy Guadeloupe, MD .  dextrose 5 % and 0.45% NaCl 1,000 mL with potassium chloride 20 mEq, magnesium sulfate 12 mEq, mannitol 12.5 g infusion, , Intravenous, Once, Sindy Guadeloupe, MD .  etoposide (VEPESID) 170 mg in sodium chloride 0.9 % 500 mL chemo infusion, 100 mg/m2 (Treatment Plan Recorded), Intravenous, Once, Sindy Guadeloupe, MD .  fosaprepitant (EMEND) 150 mg, dexamethasone (DECADRON) 12 mg in sodium chloride 0.9 % 145 mL IVPB, , Intravenous, Once, Sindy Guadeloupe, MD, Last Rate:  454 mL/hr at 03/26/17 1032 .  heparin lock flush 100 unit/mL, 500 Units, Intravenous, Once, Sindy Guadeloupe, MD .  palonosetron (ALOXI) injection 0.25 mg, 0.25 mg, Intravenous, Once, Sindy Guadeloupe, MD .  sodium chloride flush (NS) 0.9 % injection 10 mL, 10 mL, Intravenous, PRN, Sindy Guadeloupe, MD, 10 mL at 03/26/17 0948  Physical exam:  Vitals:   03/26/17 1011  BP: 114/76  Pulse: (!) 103  Resp: 16  Temp: (!) 97 F (36.1 C)  TempSrc: Tympanic  Weight: 140 lb 10.5 oz (63.8 kg)   Physical Exam  Constitutional: She is oriented to person, place, and time and well-developed, well-nourished, and in no distress.  HENT:  Head: Normocephalic and atraumatic.  Poor dentition  Eyes: Pupils are equal, round, and reactive to light. EOM are normal.  Neck: Normal range of motion.  Cardiovascular: Normal rate, regular rhythm and normal heart sounds.   Pulmonary/Chest: Effort normal and breath sounds normal.  Abdominal: Soft. Bowel sounds are normal.  Neurological: She is alert and oriented to person, place, and time.  Skin: Skin is warm and dry.  Superficial blisters noted on b/l middle fingers. 3 of them. 1 of which has scabbed and healing     CMP Latest Ref Rng & Units 03/26/2017  Glucose 65 - 99 mg/dL 115(H)  BUN 6 - 20 mg/dL 15  Creatinine 0.44 - 1.00 mg/dL 0.70  Sodium 135 - 145 mmol/L 137  Potassium 3.5 - 5.1 mmol/L 4.1  Chloride 101 - 111 mmol/L 102  CO2 22 - 32 mmol/L 24  Calcium 8.9 - 10.3 mg/dL 8.9  Total Protein 6.5 - 8.1 g/dL 6.8  Total Bilirubin 0.3 - 1.2 mg/dL 0.6  Alkaline Phos 38 - 126 U/L 93  AST 15 - 41 U/L 16  ALT 14 - 54 U/L 12(L)   CBC Latest Ref Rng & Units 03/26/2017  WBC 3.6 - 11.0 K/uL 6.0  Hemoglobin 12.0 - 16.0 g/dL 12.1  Hematocrit 35.0 - 47.0 % 36.4  Platelets 150 - 440 K/uL 475(H)    No images are attached to the encounter.  Ir Fluoro Guide Port Insertion Right  Result Date: 03/02/2017 CLINICAL DATA:  Right-sided small cell lung carcinoma and need  for porta cath to start chemotherapy. EXAM: IMPLANTED PORT A CATH PLACEMENT WITH ULTRASOUND AND FLUOROSCOPIC GUIDANCE ANESTHESIA/SEDATION: 2.0 mg IV Versed; 100 mcg IV Fentanyl Total Moderate Sedation Time:  37 minutes The patient's level of consciousness and physiologic status were continuously monitored during the procedure by Radiology nursing. Additional Medications: 2 g IV Ancef. FLUOROSCOPY TIME:  18 seconds.  2.0 mGy. PROCEDURE: The procedure, risks, benefits, and alternatives were explained to the patient. Questions regarding the procedure were encouraged and answered. The patient understands and consents to the procedure. A time-out was performed prior to initiating the  procedure. Ultrasound was utilized to confirm patency of the left internal jugular vein. The left neck and chest were prepped with chlorhexidine in a sterile fashion, and a sterile drape was applied covering the operative field. Maximum barrier sterile technique with sterile gowns and gloves were used for the procedure. Local anesthesia was provided with 1% lidocaine. After creating a small venotomy incision, a 21 gauge needle was advanced into the left internal jugular vein under direct, real-time ultrasound guidance. Ultrasound image documentation was performed. After securing guidewire access, an 8 Fr dilator was placed. A J-wire was kinked to measure appropriate catheter length. A subcutaneous port pocket was then created along the upper chest wall utilizing sharp and blunt dissection. Portable cautery was utilized. The pocket was irrigated with sterile saline. A single lumen power injectable port was chosen for placement. The 8 Fr catheter was tunneled from the port pocket site to the venotomy incision. The port was placed in the pocket. External catheter was trimmed to appropriate length based on guidewire measurement. At the venotomy, an 8 Fr peel-away sheath was placed over a guidewire. The catheter was then placed through the sheath  and the sheath removed. Final catheter positioning was confirmed and documented with a fluoroscopic spot image. The port was accessed with a needle and aspirated and flushed with heparinized saline. The access needle was removed. The venotomy and port pocket incisions were closed with subcutaneous 3-0 Monocryl and subcuticular 4-0 Vicryl. Dermabond was applied to both incisions. COMPLICATIONS: COMPLICATIONS None FINDINGS: After catheter placement, the tip lies at the cavo-atrial junction. The catheter aspirates normally and is ready for immediate use. IMPRESSION: Placement of single lumen port a cath via left internal jugular vein. The catheter tip lies at the cavo-atrial junction. A power injectable port a cath was placed and is ready for immediate use. Electronically Signed   By: Aletta Edouard M.D.   On: 03/02/2017 15:18     Assessment and plan- Patient is a 71 y.o. 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 She was neutropenic last week but her counts have recovered today. No neutropenia. Ok to proceed with cycle 2 of chemotherapy today. I will see her back in 3 weeks with cbc, cmp. Interim bloodwork- cbc and cmp to eb checked in 10 days. Will also obtain interim scans- CT chest abdomen and pelvis after 2 cycles to assess response  2. Skin blisters- do not appear to be infected. Continue to monitor   Visit Diagnosis 1. Small cell lung cancer, right upper lobe ([place])   2. Encounter for antineoplastic chemotherapy      Dr. Randa Evens, MD, MPH [facility] Pager- [phone] 03/26/2017 10:41 AM

## 2017-03-26 NOTE — Progress Notes (Signed)
Paitent here for labs and radation, but receiving chemo treatment in [place] today.

## 2017-03-27 ENCOUNTER — Ambulatory Visit
Admission: RE | Admit: 2017-03-27 | Discharge: 2017-03-27 | Disposition: A | Discharge status: Home / Self Care | Payer: Medicare Other | Source: Ambulatory Visit | Attending: Radiation Oncology | Admitting: Radiation Oncology

## 2017-03-27 ENCOUNTER — Inpatient Hospital Stay: Payer: Medicare Other

## 2017-03-27 ENCOUNTER — Ambulatory Visit: Payer: Medicare Other

## 2017-03-27 VITALS — BP 132/73 | HR 106 | Temp 96.4°F | Resp 18

## 2017-03-27 DIAGNOSIS — Z510 Encounter for antineoplastic radiation therapy: Secondary | ICD-10-CM | POA: Diagnosis not present

## 2017-03-27 DIAGNOSIS — Z5111 Encounter for antineoplastic chemotherapy: Secondary | ICD-10-CM | POA: Diagnosis not present

## 2017-03-27 DIAGNOSIS — C3411 Malignant neoplasm of upper lobe, right bronchus or lung: Secondary | ICD-10-CM

## 2017-03-27 MED ORDER — SODIUM CHLORIDE 0.9 % IV SOLN
100.0000 mg/m2 | Freq: Once | INTRAVENOUS | Status: AC
  Administered 2017-03-27: 170 mg via INTRAVENOUS
  Filled 2017-03-27: qty 8.5

## 2017-03-27 MED ORDER — SODIUM CHLORIDE 0.9 % IV SOLN: 10.0000 mg | Freq: Once | INTRAVENOUS | Status: DC

## 2017-03-27 MED ORDER — SODIUM CHLORIDE 0.9% FLUSH
10.0000 mL | INTRAVENOUS | Status: DC | PRN
  Administered 2017-03-27: 10 mL
  Filled 2017-03-27: qty 10

## 2017-03-27 MED ORDER — SODIUM CHLORIDE 0.9 % IV SOLN
Freq: Once | INTRAVENOUS | Status: AC
  Administered 2017-03-27: 09:00:00 via INTRAVENOUS
  Filled 2017-03-27: qty 1000

## 2017-03-27 MED ORDER — ETOPOSIDE CHEMO INJECTION 1 GM/50ML
100.0000 mg/m2 | Freq: Once | INTRAVENOUS | Status: AC
  Administered 2017-03-28: 170 mg via INTRAVENOUS
  Filled 2017-03-27: qty 8.5

## 2017-03-27 MED ORDER — DEXAMETHASONE SODIUM PHOSPHATE 10 MG/ML IJ SOLN
10.0000 mg | Freq: Once | INTRAMUSCULAR | Status: AC
  Administered 2017-03-27: 10 mg via INTRAVENOUS
  Filled 2017-03-27: qty 1

## 2017-03-27 MED ORDER — HEPARIN SOD (PORK) LOCK FLUSH 100 UNIT/ML IV SOLN
500.0000 [IU] | Freq: Once | INTRAVENOUS | Status: AC | PRN
  Administered 2017-03-27: 500 [IU]
  Filled 2017-03-27: qty 5

## 2017-03-28 ENCOUNTER — Ambulatory Visit
Admission: RE | Admit: 2017-03-28 | Discharge: 2017-03-28 | Disposition: A | Discharge status: Home / Self Care | Payer: Medicare Other | Source: Ambulatory Visit | Attending: Radiation Oncology | Admitting: Radiation Oncology

## 2017-03-28 ENCOUNTER — Ambulatory Visit: Payer: Medicare Other

## 2017-03-28 ENCOUNTER — Inpatient Hospital Stay: Payer: Medicare Other

## 2017-03-28 VITALS — BP 124/68 | HR 79 | Temp 97.1°F | Resp 18

## 2017-03-28 DIAGNOSIS — Z510 Encounter for antineoplastic radiation therapy: Secondary | ICD-10-CM | POA: Diagnosis not present

## 2017-03-28 DIAGNOSIS — Z5111 Encounter for antineoplastic chemotherapy: Secondary | ICD-10-CM | POA: Diagnosis not present

## 2017-03-28 DIAGNOSIS — C3411 Malignant neoplasm of upper lobe, right bronchus or lung: Secondary | ICD-10-CM

## 2017-03-28 MED ORDER — HEPARIN SOD (PORK) LOCK FLUSH 100 UNIT/ML IV SOLN
500.0000 [IU] | Freq: Once | INTRAVENOUS | Status: AC | PRN
  Administered 2017-03-28: 500 [IU]
  Filled 2017-03-28: qty 5

## 2017-03-28 MED ORDER — SODIUM CHLORIDE 0.9 % IV SOLN: 10.0000 mg | Freq: Once | INTRAVENOUS | Status: DC

## 2017-03-28 MED ORDER — SODIUM CHLORIDE 0.9% FLUSH
10.0000 mL | INTRAVENOUS | Status: DC | PRN
  Administered 2017-03-28: 10 mL
  Filled 2017-03-28: qty 10

## 2017-03-28 MED ORDER — DEXAMETHASONE SODIUM PHOSPHATE 10 MG/ML IJ SOLN
10.0000 mg | Freq: Once | INTRAMUSCULAR | Status: AC
  Administered 2017-03-28: 10 mg via INTRAVENOUS

## 2017-03-28 MED ORDER — SODIUM CHLORIDE 0.9 % IV SOLN
Freq: Once | INTRAVENOUS | Status: AC
  Administered 2017-03-28: 09:00:00 via INTRAVENOUS
  Filled 2017-03-28: qty 1000

## 2017-03-29 ENCOUNTER — Ambulatory Visit
Admission: RE | Admit: 2017-03-29 | Discharge: 2017-03-29 | Disposition: A | Discharge status: Home / Self Care | Payer: Medicare Other | Source: Ambulatory Visit | Attending: Radiation Oncology | Admitting: Radiation Oncology

## 2017-03-29 DIAGNOSIS — Z510 Encounter for antineoplastic radiation therapy: Secondary | ICD-10-CM | POA: Diagnosis not present

## 2017-03-30 ENCOUNTER — Ambulatory Visit
Admission: RE | Admit: 2017-03-30 | Discharge: 2017-03-30 | Disposition: A | Discharge status: Home / Self Care | Payer: Medicare Other | Source: Ambulatory Visit | Attending: Radiation Oncology | Admitting: Radiation Oncology

## 2017-03-30 DIAGNOSIS — Z510 Encounter for antineoplastic radiation therapy: Secondary | ICD-10-CM | POA: Diagnosis not present

## 2017-04-02 ENCOUNTER — Ambulatory Visit
Admission: RE | Admit: 2017-04-02 | Discharge: 2017-04-02 | Disposition: A | Discharge status: Home / Self Care | Payer: Medicare Other | Source: Ambulatory Visit | Attending: Radiation Oncology | Admitting: Radiation Oncology

## 2017-04-02 DIAGNOSIS — Z510 Encounter for antineoplastic radiation therapy: Secondary | ICD-10-CM | POA: Diagnosis not present

## 2017-04-03 ENCOUNTER — Other Ambulatory Visit (INDEPENDENT_AMBULATORY_CARE_PROVIDER_SITE_OTHER): Payer: Self-pay | Admitting: Vascular Surgery

## 2017-04-03 ENCOUNTER — Other Ambulatory Visit: Payer: Self-pay | Admitting: *Deleted

## 2017-04-03 ENCOUNTER — Ambulatory Visit
Admission: RE | Admit: 2017-04-03 | Discharge: 2017-04-03 | Disposition: A | Discharge status: Home / Self Care | Payer: Medicare Other | Source: Ambulatory Visit | Attending: Radiation Oncology | Admitting: Radiation Oncology

## 2017-04-03 DIAGNOSIS — I7090 Unspecified atherosclerosis: Secondary | ICD-10-CM

## 2017-04-03 DIAGNOSIS — Z510 Encounter for antineoplastic radiation therapy: Secondary | ICD-10-CM | POA: Diagnosis not present

## 2017-04-03 MED ORDER — SUCRALFATE 1 G PO TABS: 1.0000 g | ORAL_TABLET | Freq: Three times a day (TID) | ORAL | 0 refills | Status: DC

## 2017-04-04 ENCOUNTER — Other Ambulatory Visit (INDEPENDENT_AMBULATORY_CARE_PROVIDER_SITE_OTHER): Payer: Self-pay | Admitting: Vascular Surgery

## 2017-04-04 ENCOUNTER — Ambulatory Visit
Admission: RE | Admit: 2017-04-04 | Discharge: 2017-04-04 | Disposition: A | Discharge status: Home / Self Care | Payer: Medicare Other | Source: Ambulatory Visit | Attending: Radiation Oncology | Admitting: Radiation Oncology

## 2017-04-04 DIAGNOSIS — I739 Peripheral vascular disease, unspecified: Secondary | ICD-10-CM

## 2017-04-04 DIAGNOSIS — Z510 Encounter for antineoplastic radiation therapy: Secondary | ICD-10-CM | POA: Diagnosis not present

## 2017-04-05 ENCOUNTER — Ambulatory Visit (INDEPENDENT_AMBULATORY_CARE_PROVIDER_SITE_OTHER): Payer: Medicare Other

## 2017-04-05 ENCOUNTER — Ambulatory Visit
Admission: RE | Admit: 2017-04-05 | Discharge: 2017-04-05 | Disposition: A | Discharge status: Home / Self Care | Payer: Medicare Other | Source: Ambulatory Visit | Attending: Radiation Oncology | Admitting: Radiation Oncology

## 2017-04-05 ENCOUNTER — Ambulatory Visit (INDEPENDENT_AMBULATORY_CARE_PROVIDER_SITE_OTHER): Payer: Medicare Other | Admitting: Vascular Surgery

## 2017-04-05 ENCOUNTER — Encounter (INDEPENDENT_AMBULATORY_CARE_PROVIDER_SITE_OTHER): Payer: Self-pay | Admitting: Vascular Surgery

## 2017-04-05 VITALS — BP 116/72 | HR 73 | Resp 17 | Ht 65.0 in | Wt 137.0 lb

## 2017-04-05 DIAGNOSIS — I714 Abdominal aortic aneurysm, without rupture, unspecified: Secondary | ICD-10-CM

## 2017-04-05 DIAGNOSIS — I739 Peripheral vascular disease, unspecified: Secondary | ICD-10-CM

## 2017-04-05 DIAGNOSIS — E782 Mixed hyperlipidemia: Secondary | ICD-10-CM

## 2017-04-05 DIAGNOSIS — Z510 Encounter for antineoplastic radiation therapy: Secondary | ICD-10-CM | POA: Diagnosis not present

## 2017-04-05 NOTE — Progress Notes (Signed)
Subjective:    Patient ID: Roberta [name], female    DOB: 1945-10-27, 71 y.o.   MRN: [id number] Chief Complaint  Patient presents with  . Follow-up    30mos ABI   The patient presents for a 6 month abdominal aortic aneurysm follow-up. The patient underwent a endovascular repair of a abdominal aortic aneurysm with renal artery stent placement on 12/06/2016. An EVAR completed today was notable for a patent endovascular abdominal aortic aneurysm repair with no extra stent flow noted. No hemodynamically significant increase in velocities of the distal abdominal aorta or bilateral proximal common iliac arteries. When compared to previous duplex there is no change.Patient denies any fever, nausea or vomiting. The patient also underwent a bilateral ABI which was notable for no hemodynamically significant arterial occlusive disease to the bilateral lower extremity. Triphasic tibials bilaterally. Patient is currently undergoing chemotherapy for small cell lung cancer.   Review of Systems  Constitutional: Negative.   HENT: Negative.   Eyes: Negative.   Respiratory: Negative.   Cardiovascular: Negative.   Gastrointestinal: Negative.   Endocrine: Negative.   Genitourinary: Negative.   Musculoskeletal: Negative.   Skin: Negative.   Allergic/Immunologic: Negative.   Neurological: Negative.   Hematological: Negative.   Psychiatric/Behavioral: Negative.       Objective:   Physical Exam  Constitutional: She is oriented to person, place, and time. She appears well-developed and well-nourished. No distress.  Patient does not have hair due to chemotherapy  HENT:  Head: Normocephalic and atraumatic.  Eyes: Conjunctivae are normal. Pupils are equal, round, and reactive to light.  Neck: Normal range of motion.  Cardiovascular: Normal rate, regular rhythm, normal heart sounds and intact distal pulses.  Pulses:      Radial pulses are 2+ on the right side, and 2+ on the left side.       Dorsalis  pedis pulses are 2+ on the right side, and 2+ on the left side.       Posterior tibial pulses are 2+ on the right side, and 2+ on the left side.  Pulmonary/Chest: Effort normal.  Abdominal: Soft. Bowel sounds are normal. She exhibits no distension. There is no tenderness. There is no rebound.  Musculoskeletal: Normal range of motion. She exhibits no edema.  Neurological: She is alert and oriented to person, place, and time.  Skin: Skin is warm and dry. She is not diaphoretic.  Psychiatric: She has a normal mood and affect. Her behavior is normal. Judgment and thought content normal.  Vitals reviewed.  BP 116/72 (BP Location: Right Arm)   Pulse 73   Resp 17   Ht 5\' 5"  (1.651 m)   Wt 137 lb (62.1 kg)   BMI 22.80 kg/m   Past Medical History:  Diagnosis Date  . AAA (abdominal aortic aneurysm) ([place])   . Breast cancer ([place]) 2002   left  . COPD (chronic obstructive pulmonary disease) ([place])   . High cholesterol   . Personal history of chemotherapy   . Small cell lung cancer ([place])   . Tobacco abuse    Social History   Socioeconomic History  . Marital status: Widowed    Spouse name: Not on file  . Number of children: Not on file  . Years of education: Not on file  . Highest education level: Not on file  Social Needs  . Financial resource strain: Not on file  . Food insecurity - worry: Not on file  . Food insecurity - inability: Not on file  .  Transportation needs - medical: Not on file  . Transportation needs - non-medical: Not on file  Occupational History  . Not on file  Tobacco Use  . Smoking status: Current Every Day Smoker    Packs/day: 0.50    Types: Cigarettes  . Smokeless tobacco: Never Used  Substance and Sexual Activity  . Alcohol use: No  . Drug use: No  . Sexual activity: No  Other Topics Concern  . Not on file  Social History Narrative  . Not on file   Past Surgical History:  Procedure Laterality Date  . ABDOMINAL HYSTERECTOMY    . IR FLUORO GUIDE PORT  INSERTION RIGHT  03/02/2017  . MASTECTOMY Left 2002   with sentinel node    Family History  Problem Relation Age of Onset  . Breast cancer Neg Hx    No Known Allergies     Assessment & Plan:  The patient presents for a 6 month abdominal aortic aneurysm follow-up. The patient underwent a endovascular repair of a abdominal aortic aneurysm with renal artery stent placement on 12/06/2016. An EVAR completed today was notable for a patent endovascular abdominal aortic aneurysm repair with no extra stent flow noted. No hemodynamically significant increase in velocities of the distal abdominal aorta or bilateral proximal common iliac arteries. When compared to previous duplex there is no change.Patient denies any fever, nausea or vomiting. The patient also underwent a bilateral ABI which was notable for no hemodynamically significant arterial occlusive disease to the bilateral lower extremity. Triphasic tibials bilaterally. Patient is currently undergoing chemotherapy for small cell lung cancer.  1. AAA (abdominal aortic aneurysm) without rupture ([place]) -  Studies reviewed with patient. Duplex stable with no leak, physical exam unremarkable. No surgery or intervention at this time. The patient to follow up in six months with an aortic duplex. The patient has a stable asymptomatic abdominal aortic aneurysm s/p repair. I have reviewed the natural history of abdominal aortic aneurysms s/p repair and the need for continued surveillance with ultrasound or CT scan is mandatory.  I have discussed with the patient at length the risk factors for and pathogenesis of atherosclerotic disease and encouraged a healthy diet, regular exercise regimen and blood pressure / glucose control. The patient is to follow up in six months for an aortic duplex with ABI's. Patient was instructed to contact our office with problems in the interim such back pain, pulsatile abdominal masses or thrombosis in her extremities, extremity  pain or development of ulcerations.    - VAS US RENAL ARTERY DUPLEX; Future  2. PAD (peripheral artery disease) ([facility]) - Stable Studies reviewed with patient. Asymptomatic at this time. The physical exam is unremarkable. Normal ABI There is no indication for intervention at this time Patient to follow up in 6 months Patient to remain abstinent of tobacco use. I have discussed with the patient at length the risk factors for and pathogenesis of atherosclerotic disease and encouraged a healthy diet, regular exercise regimen and blood pressure / glucose control.  The patient was encouraged to call the office in the interim if he experiences any claudication like symptoms, rest pain or ulcers to his feet / toes.  - VAS [place] ABI WITH/WO TBI; Future  3. Mixed hyperlipidemia - Stable Encouraged good control as its slows the progression of atherosclerotic disease  Current Outpatient Medications on File Prior to Visit  Medication Sig Dispense Refill  . albuterol (PROVENTIL HFA;VENTOLIN HFA) 108 (90 Base) MCG/ACT inhaler Inhale 2 puffs into the lungs every  6 (six) hours as needed for wheezing or shortness of breath. 1 Inhaler 1  . aspirin EC 81 MG tablet Take 81 mg by mouth daily.    Marland Kitchen atorvastatin (LIPITOR) 10 MG tablet Take 10 mg by mouth at bedtime.     Marland Kitchen dexamethasone (DECADRON) 4 MG tablet Take 2 tablets two times a day for 1 day on day 4 after cisplatin chemotherapy. Take with food. 30 tablet 1  . fluticasone-salmeterol (ADVAIR HFA) 115-21 MCG/ACT inhaler Inhale 2 puffs into the lungs 2 (two) times daily.    Marland Kitchen ipratropium (ATROVENT) 0.02 % nebulizer solution Take 0.5 mg by nebulization 4 (four) times daily.    . ondansetron (ZOFRAN) 8 MG tablet Take 1 tablet (8 mg total) by mouth 2 (two) times daily as needed. Start on the third day after cisplatin chemotherapy. 30 tablet 1  . oxyCODONE (OXY IR/ROXICODONE) 5 MG immediate release tablet Take 1 tablet (5 mg total) by mouth every 6 (six) hours as  needed for severe pain. 30 tablet 0  . sucralfate (CARAFATE) 1 g tablet Take 1 tablet (1 g total) 4 (four) times daily -  with meals and at bedtime by mouth. dissolve in 8 oz of water 90 tablet 0  . acetaminophen (TYLENOL) 500 MG tablet Take 1,000 mg by mouth every 6 (six) hours as needed (for pain.).    Marland Kitchen lidocaine-prilocaine (EMLA) cream Apply to affected area once (Patient not taking: Reported on 03/13/2017) 30 g 3  . LORazepam (ATIVAN) 0.5 MG tablet Take 1 tablet (0.5 mg total) by mouth every 6 (six) hours as needed (Nausea or vomiting). (Patient not taking: Reported on 03/13/2017) 30 tablet 0  . prochlorperazine (COMPAZINE) 10 MG tablet Take 1 tablet (10 mg total) by mouth every 6 (six) hours as needed (Nausea or vomiting). (Patient not taking: Reported on 03/02/2017) 30 tablet 1   Current Facility-Administered Medications on File Prior to Visit  Medication Dose Route Frequency Provider Last Rate Last Dose  . sodium chloride flush (NS) 0.9 % injection 10 mL  10 mL Intravenous PRN Sindy Guadeloupe, MD   10 mL at 03/26/17 0948   There are no Patient Instructions on file for this visit. No Follow-up on file.  KIMBERLY A STEGMAYER, PA-C

## 2017-04-06 ENCOUNTER — Ambulatory Visit
Admission: RE | Admit: 2017-04-06 | Discharge: 2017-04-06 | Disposition: A | Discharge status: Home / Self Care | Payer: Medicare Other | Source: Ambulatory Visit | Attending: Oncology | Admitting: Oncology

## 2017-04-06 ENCOUNTER — Ambulatory Visit
Admission: RE | Admit: 2017-04-06 | Discharge: 2017-04-06 | Disposition: A | Discharge status: Home / Self Care | Payer: Medicare Other | Source: Ambulatory Visit | Attending: Radiation Oncology | Admitting: Radiation Oncology

## 2017-04-06 ENCOUNTER — Inpatient Hospital Stay: Payer: Medicare Other | Attending: Internal Medicine

## 2017-04-06 DIAGNOSIS — Z5111 Encounter for antineoplastic chemotherapy: Secondary | ICD-10-CM | POA: Diagnosis not present

## 2017-04-06 DIAGNOSIS — E876 Hypokalemia: Secondary | ICD-10-CM | POA: Insufficient documentation

## 2017-04-06 DIAGNOSIS — R911 Solitary pulmonary nodule: Secondary | ICD-10-CM | POA: Diagnosis not present

## 2017-04-06 DIAGNOSIS — K5730 Diverticulosis of large intestine without perforation or abscess without bleeding: Secondary | ICD-10-CM | POA: Diagnosis not present

## 2017-04-06 DIAGNOSIS — M47816 Spondylosis without myelopathy or radiculopathy, lumbar region: Secondary | ICD-10-CM | POA: Insufficient documentation

## 2017-04-06 DIAGNOSIS — E861 Hypovolemia: Secondary | ICD-10-CM | POA: Diagnosis not present

## 2017-04-06 DIAGNOSIS — E7800 Pure hypercholesterolemia, unspecified: Secondary | ICD-10-CM | POA: Diagnosis not present

## 2017-04-06 DIAGNOSIS — R590 Localized enlarged lymph nodes: Secondary | ICD-10-CM | POA: Insufficient documentation

## 2017-04-06 DIAGNOSIS — J90 Pleural effusion, not elsewhere classified: Secondary | ICD-10-CM | POA: Diagnosis not present

## 2017-04-06 DIAGNOSIS — F17210 Nicotine dependence, cigarettes, uncomplicated: Secondary | ICD-10-CM | POA: Diagnosis not present

## 2017-04-06 DIAGNOSIS — Z95828 Presence of other vascular implants and grafts: Secondary | ICD-10-CM | POA: Insufficient documentation

## 2017-04-06 DIAGNOSIS — Z79899 Other long term (current) drug therapy: Secondary | ICD-10-CM | POA: Diagnosis not present

## 2017-04-06 DIAGNOSIS — Z510 Encounter for antineoplastic radiation therapy: Secondary | ICD-10-CM | POA: Diagnosis not present

## 2017-04-06 DIAGNOSIS — I714 Abdominal aortic aneurysm, without rupture: Secondary | ICD-10-CM | POA: Diagnosis not present

## 2017-04-06 DIAGNOSIS — I700 Atherosclerosis of aorta: Secondary | ICD-10-CM | POA: Insufficient documentation

## 2017-04-06 DIAGNOSIS — M5136 Other intervertebral disc degeneration, lumbar region: Secondary | ICD-10-CM | POA: Diagnosis not present

## 2017-04-06 DIAGNOSIS — R000 Tachycardia, unspecified: Secondary | ICD-10-CM | POA: Insufficient documentation

## 2017-04-06 DIAGNOSIS — Z9012 Acquired absence of left breast and nipple: Secondary | ICD-10-CM | POA: Insufficient documentation

## 2017-04-06 DIAGNOSIS — J449 Chronic obstructive pulmonary disease, unspecified: Secondary | ICD-10-CM | POA: Insufficient documentation

## 2017-04-06 DIAGNOSIS — I2510 Atherosclerotic heart disease of native coronary artery without angina pectoris: Secondary | ICD-10-CM | POA: Insufficient documentation

## 2017-04-06 DIAGNOSIS — J439 Emphysema, unspecified: Secondary | ICD-10-CM | POA: Insufficient documentation

## 2017-04-06 DIAGNOSIS — I739 Peripheral vascular disease, unspecified: Secondary | ICD-10-CM

## 2017-04-06 DIAGNOSIS — I959 Hypotension, unspecified: Secondary | ICD-10-CM | POA: Insufficient documentation

## 2017-04-06 DIAGNOSIS — C3411 Malignant neoplasm of upper lobe, right bronchus or lung: Secondary | ICD-10-CM

## 2017-04-06 LAB — CBC WITH DIFFERENTIAL/PLATELET
Basophils Absolute: 0 10*3/uL (ref 0–0.1)
Basophils Relative: 1 %
Eosinophils Absolute: 0 10*3/uL (ref 0–0.7)
Eosinophils Relative: 0 %
HEMATOCRIT: 35.7 % (ref 35.0–47.0)
Hemoglobin: 11.8 g/dL — ABNORMAL LOW (ref 12.0–16.0)
LYMPHS PCT: 38 %
Lymphs Abs: 0.6 10*3/uL — ABNORMAL LOW (ref 1.0–3.6)
MCH: 28.8 pg (ref 26.0–34.0)
MCHC: 33 g/dL (ref 32.0–36.0)
MCV: 87.3 fL (ref 80.0–100.0)
MONO ABS: 0.3 10*3/uL (ref 0.2–0.9)
MONOS PCT: 18 %
NEUTROS ABS: 0.6 10*3/uL — AB (ref 1.4–6.5)
Neutrophils Relative %: 43 %
Platelets: 119 10*3/uL — ABNORMAL LOW (ref 150–440)
RBC: 4.08 MIL/uL (ref 3.80–5.20)
RDW: 15.1 % — AB (ref 11.5–14.5)
WBC: 1.5 10*3/uL — ABNORMAL LOW (ref 3.6–11.0)

## 2017-04-06 LAB — COMPREHENSIVE METABOLIC PANEL
ALBUMIN: 3.5 g/dL (ref 3.5–5.0)
ALK PHOS: 91 U/L (ref 38–126)
ALT: 18 U/L (ref 14–54)
ANION GAP: 8 (ref 5–15)
AST: 18 U/L (ref 15–41)
BUN: 8 mg/dL (ref 6–20)
CALCIUM: 8.8 mg/dL — AB (ref 8.9–10.3)
CO2: 28 mmol/L (ref 22–32)
Chloride: 97 mmol/L — ABNORMAL LOW (ref 101–111)
Creatinine, Ser: 0.67 mg/dL (ref 0.44–1.00)
GFR calc Af Amer: >60 mL/min (ref >60)
GFR calc non Af Amer: >60 mL/min (ref >60)
GLUCOSE: 114 mg/dL — AB (ref 65–99)
POTASSIUM: 4 mmol/L (ref 3.5–5.1)
SODIUM: 133 mmol/L — AB (ref 135–145)
Total Bilirubin: 0.3 mg/dL (ref 0.3–1.2)
Total Protein: 6.9 g/dL (ref 6.5–8.1)

## 2017-04-06 MED ORDER — IOPAMIDOL (ISOVUE-300) INJECTION 61%
100.0000 mL | Freq: Once | INTRAVENOUS | Status: AC | PRN
  Administered 2017-04-06: 100 mL via INTRAVENOUS

## 2017-04-09 ENCOUNTER — Ambulatory Visit
Admission: RE | Admit: 2017-04-09 | Discharge: 2017-04-09 | Disposition: A | Discharge status: Home / Self Care | Payer: Medicare Other | Source: Ambulatory Visit | Attending: Radiation Oncology | Admitting: Radiation Oncology

## 2017-04-09 DIAGNOSIS — Z510 Encounter for antineoplastic radiation therapy: Secondary | ICD-10-CM | POA: Diagnosis not present

## 2017-04-10 ENCOUNTER — Ambulatory Visit
Admission: RE | Admit: 2017-04-10 | Discharge: 2017-04-10 | Disposition: A | Discharge status: Home / Self Care | Payer: Medicare Other | Source: Ambulatory Visit | Attending: Radiation Oncology | Admitting: Radiation Oncology

## 2017-04-10 DIAGNOSIS — Z510 Encounter for antineoplastic radiation therapy: Secondary | ICD-10-CM | POA: Diagnosis not present

## 2017-04-11 ENCOUNTER — Ambulatory Visit
Admission: RE | Admit: 2017-04-11 | Discharge: 2017-04-11 | Disposition: A | Discharge status: Home / Self Care | Payer: Medicare Other | Source: Ambulatory Visit | Attending: Radiation Oncology | Admitting: Radiation Oncology

## 2017-04-11 DIAGNOSIS — Z510 Encounter for antineoplastic radiation therapy: Secondary | ICD-10-CM | POA: Diagnosis not present

## 2017-04-12 ENCOUNTER — Ambulatory Visit
Admission: RE | Admit: 2017-04-12 | Discharge: 2017-04-12 | Disposition: A | Discharge status: Home / Self Care | Payer: Medicare Other | Source: Ambulatory Visit | Attending: Radiation Oncology | Admitting: Radiation Oncology

## 2017-04-12 DIAGNOSIS — Z510 Encounter for antineoplastic radiation therapy: Secondary | ICD-10-CM | POA: Diagnosis not present

## 2017-04-13 ENCOUNTER — Ambulatory Visit
Admission: RE | Admit: 2017-04-13 | Discharge: 2017-04-13 | Disposition: A | Discharge status: Home / Self Care | Payer: Medicare Other | Source: Ambulatory Visit | Attending: Radiation Oncology | Admitting: Radiation Oncology

## 2017-04-13 DIAGNOSIS — Z510 Encounter for antineoplastic radiation therapy: Secondary | ICD-10-CM | POA: Diagnosis not present

## 2017-04-16 ENCOUNTER — Ambulatory Visit
Admission: RE | Admit: 2017-04-16 | Discharge: 2017-04-16 | Disposition: A | Discharge status: Home / Self Care | Payer: Medicare Other | Source: Ambulatory Visit | Attending: Radiation Oncology | Admitting: Radiation Oncology

## 2017-04-16 ENCOUNTER — Inpatient Hospital Stay (HOSPITAL_BASED_OUTPATIENT_CLINIC_OR_DEPARTMENT_OTHER): Payer: Medicare Other | Admitting: Oncology

## 2017-04-16 ENCOUNTER — Inpatient Hospital Stay: Payer: Medicare Other

## 2017-04-16 ENCOUNTER — Encounter: Payer: Self-pay | Admitting: Oncology

## 2017-04-16 ENCOUNTER — Other Ambulatory Visit: Payer: Medicare Other

## 2017-04-16 VITALS — BP 104/66 | HR 98 | Temp 98.2°F | Resp 18

## 2017-04-16 VITALS — BP 88/58 | HR 121 | Temp 99.6°F | Resp 18 | Wt 136.5 lb

## 2017-04-16 DIAGNOSIS — Z510 Encounter for antineoplastic radiation therapy: Secondary | ICD-10-CM | POA: Diagnosis not present

## 2017-04-16 DIAGNOSIS — C3411 Malignant neoplasm of upper lobe, right bronchus or lung: Secondary | ICD-10-CM

## 2017-04-16 DIAGNOSIS — Z79899 Other long term (current) drug therapy: Secondary | ICD-10-CM

## 2017-04-16 DIAGNOSIS — Z5111 Encounter for antineoplastic chemotherapy: Secondary | ICD-10-CM

## 2017-04-16 DIAGNOSIS — I959 Hypotension, unspecified: Secondary | ICD-10-CM | POA: Diagnosis not present

## 2017-04-16 DIAGNOSIS — E876 Hypokalemia: Secondary | ICD-10-CM | POA: Diagnosis not present

## 2017-04-16 LAB — CBC WITH DIFFERENTIAL/PLATELET
BASOS ABS: 0.2 10*3/uL — AB (ref 0–0.1)
BASOS PCT: 1 %
EOS ABS: 0 10*3/uL (ref 0–0.7)
EOS PCT: 0 %
HCT: 30.5 % — ABNORMAL LOW (ref 35.0–47.0)
Hemoglobin: 10 g/dL — ABNORMAL LOW (ref 12.0–16.0)
LYMPHS PCT: 4 %
Lymphs Abs: 0.5 10*3/uL — ABNORMAL LOW (ref 1.0–3.6)
MCH: 28.7 pg (ref 26.0–34.0)
MCHC: 32.9 g/dL (ref 32.0–36.0)
MCV: 87.1 fL (ref 80.0–100.0)
MONO ABS: 1.8 10*3/uL — AB (ref 0.2–0.9)
Monocytes Relative: 12 %
Neutro Abs: 12.2 10*3/uL — ABNORMAL HIGH (ref 1.4–6.5)
Neutrophils Relative %: 83 %
PLATELETS: 346 10*3/uL (ref 150–440)
RBC: 3.5 MIL/uL — AB (ref 3.80–5.20)
RDW: 16.2 % — AB (ref 11.5–14.5)
WBC: 14.7 10*3/uL — AB (ref 3.6–11.0)

## 2017-04-16 LAB — COMPREHENSIVE METABOLIC PANEL
ALBUMIN: 2.3 g/dL — AB (ref 3.5–5.0)
ALK PHOS: 111 U/L (ref 38–126)
ALT: 12 U/L — ABNORMAL LOW (ref 14–54)
AST: 16 U/L (ref 15–41)
Anion gap: 8 (ref 5–15)
BILIRUBIN TOTAL: 0.5 mg/dL (ref 0.3–1.2)
BUN: 11 mg/dL (ref 6–20)
CALCIUM: 8.1 mg/dL — AB (ref 8.9–10.3)
CO2: 29 mmol/L (ref 22–32)
CREATININE: 0.7 mg/dL (ref 0.44–1.00)
Chloride: 98 mmol/L — ABNORMAL LOW (ref 101–111)
GFR calc Af Amer: >60 mL/min (ref >60)
GLUCOSE: 119 mg/dL — AB (ref 65–99)
POTASSIUM: 3.2 mmol/L — AB (ref 3.5–5.1)
Sodium: 135 mmol/L (ref 135–145)
TOTAL PROTEIN: 6.2 g/dL — AB (ref 6.5–8.1)

## 2017-04-16 MED ORDER — DEXAMETHASONE SODIUM PHOSPHATE 10 MG/ML IJ SOLN: 10.0000 mg | Freq: Once | INTRAMUSCULAR | Status: DC

## 2017-04-16 MED ORDER — SODIUM CHLORIDE 0.9 % IV SOLN
60.0000 mg/m2 | Freq: Once | INTRAVENOUS | Status: AC
  Administered 2017-04-16: 103 mg via INTRAVENOUS
  Filled 2017-04-16: qty 103

## 2017-04-16 MED ORDER — AMOXICILLIN-POT CLAVULANATE 875-125 MG PO TABS: 1.0000 | ORAL_TABLET | Freq: Two times a day (BID) | ORAL | 0 refills | Status: DC

## 2017-04-16 MED ORDER — SODIUM CHLORIDE 0.9% FLUSH
10.0000 mL | INTRAVENOUS | Status: AC | PRN
  Administered 2017-04-16: 10 mL via INTRAVENOUS
  Filled 2017-04-16: qty 10

## 2017-04-16 MED ORDER — SODIUM CHLORIDE 0.9 % IV SOLN
100.0000 mg/m2 | Freq: Once | INTRAVENOUS | Status: AC
  Administered 2017-04-16: 170 mg via INTRAVENOUS
  Filled 2017-04-16: qty 8.5

## 2017-04-16 MED ORDER — POTASSIUM CHLORIDE 2 MEQ/ML IV SOLN
Freq: Once | INTRAVENOUS | Status: AC
  Administered 2017-04-16: 12:00:00 via INTRAVENOUS
  Filled 2017-04-16: qty 10

## 2017-04-16 MED ORDER — SODIUM CHLORIDE 0.9 % IV SOLN
Freq: Once | INTRAVENOUS | Status: AC
Start: 2017-04-16 — End: 2017-04-16
  Administered 2017-04-16: 14:00:00 via INTRAVENOUS
  Filled 2017-04-16: qty 1000

## 2017-04-16 MED ORDER — SODIUM CHLORIDE 0.9 % IV SOLN
Freq: Once | INTRAVENOUS | Status: AC
  Administered 2017-04-16: 14:00:00 via INTRAVENOUS
  Filled 2017-04-16: qty 5

## 2017-04-16 MED ORDER — HEPARIN SOD (PORK) LOCK FLUSH 100 UNIT/ML IV SOLN
500.0000 [IU] | Freq: Once | INTRAVENOUS | Status: AC
  Administered 2017-04-16: 500 [IU] via INTRAVENOUS

## 2017-04-16 MED ORDER — SODIUM CHLORIDE 0.9 % IV SOLN
Freq: Once | INTRAVENOUS | Status: AC
  Administered 2017-04-16: 13:00:00 via INTRAVENOUS
  Filled 2017-04-16: qty 1000

## 2017-04-16 MED ORDER — HEPARIN SOD (PORK) LOCK FLUSH 100 UNIT/ML IV SOLN
INTRAVENOUS | Status: AC
Start: 2017-04-16 — End: 2017-04-16
  Filled 2017-04-16: qty 5

## 2017-04-16 MED ORDER — PALONOSETRON HCL INJECTION 0.25 MG/5ML
0.2500 mg | Freq: Once | INTRAVENOUS | Status: AC
  Administered 2017-04-16: 0.25 mg via INTRAVENOUS
  Filled 2017-04-16: qty 5

## 2017-04-16 NOTE — Progress Notes (Signed)
Patient hypotensive on arrival. Denies any dizziness or weakness.Patient was hydrated with 1 Liter of NS and her pre fluids with electrolytes. BP has come up to 93/63. MD and patient want to proceed with chemotherapy today as intent is curative.

## 2017-04-16 NOTE — Progress Notes (Signed)
Patient here for follow up with labs and treatment today. She has a cough now for about a week, which she has been taking robitussin for, but it does not seem to be helping. She denies having any pain.

## 2017-04-16 NOTE — Progress Notes (Signed)
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 Patient is a 71 year old female with a long-standing history of smoking and smoked 1 pack per day for more than 50 years and is now down to half a pack per day. She also has COPD and recently had abdominal aortic aneurysm status post stenting. She presented with symptoms of mild hemoptysis in August 2018 which prompted CT chest  2. CT chest on 01/23/2017 showed:IMPRESSION: 1. Large right suprahilar mass extending into the mediastinum consistent with primary lung carcinoma with mediastinal extension. 2. Partial atelectasis of right upper lobe due to this large suprahilar mass with probable postobstructive pneumonitis in the right upper lobe peripherally. 3. Small right pleural effusion. 4. Severe thoracic aortic atherosclerosis. Diffuse coronary artery Calcifications.   3. This was followed by a PET CT scan on 02/07/2017 showed:IMPRESSION: 1. Hypermetabolic disease in the upper right hilum and mediastinum compatible with neoplasm. 2. Mild FDG accumulation in peripheral areas of patchy airspace disease which may be secondary sites of neoplasm or related to infection/inflammation. 3. No evidence for distant metastases in the neck, abdomen, or pelvis.   4. Patient underwent bronchoscopy guided biopsy with Dr. Mortimer Fries. Pathology showed small cell lung cancer. MRI brain negative for metastatic disease  5. Patient lives  with her sister and is independent of her ADLs. She reports occasional blood-tinged sputum but no significant frank hemoptysis. Her appetite is good and she denies any unintentional weight loss. She denies any pain. She does have a history of breast cancer in 2002 status post left mastectomy followed by adjuvant chemotherapy. This was treated by Dr. Oliva Bustard. She did not receive any adjuvant radiation.  6. Cycle 1 of cis/etoposide started on 03/05/17   Interval history-she has been having constant cough for the last 1 week which is not getting better with Robitussin.  Most of the times the cough is nonproductive.  Denies any fever at home.  Appetite has been fair and her weight has been stable.  Denies any pain presently   ECOG PS- 1 Pain scale- 0 Opioid associated constipation- no  Review of systems- Review of Systems  Constitutional: Positive for malaise/fatigue. Negative for chills, fever and weight loss.  HENT: Negative for congestion, ear discharge and nosebleeds.   Eyes: Negative for blurred vision.  Respiratory: Positive for cough. Negative for hemoptysis, sputum production, shortness of breath and wheezing.   Cardiovascular: Negative for chest pain, palpitations, orthopnea and claudication.  Gastrointestinal: Negative for abdominal pain, blood in stool, constipation, diarrhea, heartburn, melena, nausea and vomiting.  Genitourinary: Negative for dysuria, flank pain, frequency, hematuria and urgency.  Musculoskeletal: Negative for back pain, joint pain and myalgias.  Skin: Negative for rash.  Neurological: Negative for dizziness, tingling, focal weakness, seizures, weakness and headaches.  Endo/Heme/Allergies: Does not bruise/bleed easily.  Psychiatric/Behavioral: Negative for depression and suicidal ideas. The patient does not have insomnia.      No Known Allergies   Past Medical History:  Diagnosis Date  . AAA (abdominal aortic aneurysm) ([place])   . Breast cancer ([place])  2002    left  . COPD (chronic obstructive pulmonary disease) ([place])   . High cholesterol   . Personal history of chemotherapy   . Small cell lung cancer ([place])   . Tobacco abuse      Past Surgical History:  Procedure Laterality Date  . ABDOMINAL HYSTERECTOMY    . ENDOBRONCHIAL ULTRASOUND N/A 02/19/2017   Performed by Flora Lipps, MD at [facility] ORS  . Endovascular Repair/Stent Graft N/A 12/06/2016   Performed by Katha Cabal, MD at [facility]  . IR FLUORO GUIDE PORT INSERTION RIGHT  03/02/2017  . MASTECTOMY Left 2002   with sentinel node     Social History   Socioeconomic History  . Marital status: Widowed    Spouse name: Not on file  . Number of children: Not on file  . Years of education: Not on file  . Highest education level: Not on file  Social Needs  . Financial resource strain: Not on file  . Food insecurity - worry: Not on file  . Food insecurity - inability: Not on file  . Transportation needs - medical: Not on file  . Transportation needs - non-medical: Not on file  Occupational History  . Not on file  Tobacco Use  . Smoking status: Current Every Day Smoker    Packs/day: 0.50    Types: Cigarettes  . Smokeless tobacco: Never Used  Substance and Sexual Activity  . Alcohol use: No  . Drug use: No  . Sexual activity: No  Other Topics Concern  . Not on file  Social History Narrative  . Not on file    Family History  Problem Relation Age of Onset  . Breast cancer Neg Hx      Current Outpatient Medications:  .  acetaminophen (TYLENOL) 500 MG tablet, Take 1,000 mg by mouth every 6 (six) hours as needed (for pain.)., Disp: , Rfl:  .  albuterol (PROVENTIL HFA;VENTOLIN HFA) 108 (90 Base) MCG/ACT inhaler, Inhale 2 puffs into the lungs every 6 (six) hours as needed for wheezing or shortness of breath., Disp: 1 Inhaler, Rfl: 1 .  aspirin EC 81 MG tablet, Take 81 mg by mouth daily., Disp: , Rfl:  .  atorvastatin (LIPITOR) 10 MG tablet, Take 10 mg by mouth at  bedtime. , Disp: , Rfl:  .  dexamethasone (DECADRON) 4 MG tablet, Take 2 tablets two times a day for 1 day on day 4 after cisplatin chemotherapy. Take with food., Disp: 30 tablet, Rfl: 1 .  fluticasone-salmeterol (ADVAIR HFA) 115-21 MCG/ACT inhaler, Inhale 2 puffs into the lungs 2 (two) times daily., Disp: , Rfl:  .  ipratropium (ATROVENT) 0.02 % nebulizer solution, Take 0.5 mg by nebulization 4 (four) times daily., Disp: , Rfl:  .  lidocaine-prilocaine (EMLA) cream, Apply to affected area once (Patient not taking: Reported on 03/13/2017), Disp: 30 g, Rfl: 3 .  LORazepam (ATIVAN) 0.5 MG tablet, Take 1 tablet (0.5 mg total) by mouth every 6 (six) hours as needed (Nausea or vomiting). (Patient not taking: Reported on 03/13/2017), Disp: 30 tablet, Rfl: 0 .  ondansetron (ZOFRAN) 8 MG tablet, Take 1 tablet (8 mg total) by mouth 2 (two) times daily as needed. Start on the third day after cisplatin chemotherapy., Disp: 30 tablet, Rfl: 1 .  oxyCODONE (OXY IR/ROXICODONE) 5 MG immediate release tablet, Take 1 tablet (5 mg total) by mouth every 6 (six) hours as needed for severe pain., Disp: 30 tablet, Rfl: 0 .  prochlorperazine (COMPAZINE)  10 MG tablet, Take 1 tablet (10 mg total) by mouth every 6 (six) hours as needed (Nausea or vomiting). (Patient not taking: Reported on 03/02/2017), Disp: 30 tablet, Rfl: 1 .  sucralfate (CARAFATE) 1 g tablet, Take 1 tablet (1 g total) 4 (four) times daily -  with meals and at bedtime by mouth. dissolve in 8 oz of water, Disp: 90 tablet, Rfl: 0 No current facility-administered medications for this visit.   Facility-Administered Medications Ordered in Other Visits:  .  sodium chloride flush (NS) 0.9 % injection 10 mL, 10 mL, Intravenous, PRN, Sindy Guadeloupe, MD, 10 mL at 03/26/17 0948  Physical exam:  Vitals:   04/16/17 1101  BP: (!) 88/58  Pulse: (!) 121  Resp: 18  Temp: 99.6 F (37.6 C)  TempSrc: Tympanic  Weight: 136 lb 7.4 oz (61.9 kg)   Physical Exam    Constitutional: She is oriented to person, place, and time.  Thin female who does not appear to be in any acute distress  HENT:  Head: Normocephalic and atraumatic.  Mouth/Throat: Oropharynx is clear and moist.  Eyes: EOM are normal. Pupils are equal, round, and reactive to light.  Neck: Normal range of motion.  Cardiovascular: Normal rate, regular rhythm and normal heart sounds.  Pulmonary/Chest: Effort normal.  Mild scattered bilateral wheezing  Abdominal: Soft. Bowel sounds are normal.  Neurological: She is alert and oriented to person, place, and time.  Skin: Skin is warm and dry.     CMP Latest Ref Rng & Units 04/16/2017  Glucose 65 - 99 mg/dL 119(H)  BUN 6 - 20 mg/dL 11  Creatinine 0.44 - 1.00 mg/dL 0.70  Sodium 135 - 145 mmol/L 135  Potassium 3.5 - 5.1 mmol/L 3.2(L)  Chloride 101 - 111 mmol/L 98(L)  CO2 22 - 32 mmol/L 29  Calcium 8.9 - 10.3 mg/dL 8.1(L)  Total Protein 6.5 - 8.1 g/dL 6.2(L)  Total Bilirubin 0.3 - 1.2 mg/dL 0.5  Alkaline Phos 38 - 126 U/L 111  AST 15 - 41 U/L 16  ALT 14 - 54 U/L 12(L)   CBC Latest Ref Rng & Units 04/16/2017  WBC 3.6 - 11.0 K/uL 14.7(H)  Hemoglobin 12.0 - 16.0 g/dL 10.0(L)  Hematocrit 35.0 - 47.0 % 30.5(L)  Platelets 150 - 440 K/uL 346    No images are attached to the encounter.  Ct Chest W Contrast  Result Date: 04/06/2017 CLINICAL DATA:  Restaging of right upper lobe small cell lung cancer, initial diagnosis September 2018, status post chemotherapy and radiation therapy. EXAM: CT CHEST, ABDOMEN, AND PELVIS WITH CONTRAST TECHNIQUE: Multidetector CT imaging of the chest, abdomen and pelvis was performed following the standard protocol during bolus administration of intravenous contrast. CONTRAST:  138mL ISOVUE-300 IOPAMIDOL (ISOVUE-300) INJECTION 61% COMPARISON:  Multiple exams, including PET-CT from 02/07/2017 FINDINGS: CT CHEST FINDINGS Cardiovascular: Left IJ line tip: Right atrium. Coronary, aortic arch, and branch vessel  atherosclerotic vascular disease. Mediastinum/Nodes: Significant reduction in the right suprahilar mass, right hilar adenopathy, and lower paratracheal adenopathy. A low-density right lower paratracheal node measures 1.4 cm in short axis on image 22/2 and previously measured 2.2 cm. This lesion is also less dense than on the prior exam. A right hilar lymph node measures 1.6 cm in short axis on image 24/2, previous 2.2 cm. Lungs/Pleura: The right hilar mass measures approximately 2.0 by 2.4 cm on image 23/2, new formerly approximately 3.7 by 4.8 cm. This also demonstrates reduced density compared the prior exam. The hypermetabolic peripheral lesion in  the right upper lobe measures 1.6 by 1.2 cm on image 31/4, previously 2.2 by 1.8 cm. Biapical pleuroparenchymal scarring. Right paramediastinal density likely due to atelectasis. Paraseptal emphysema. Bandlike left upper lobe subpleural density on images 39-40 series 4 is similar to prior and was not previously hypermetabolic, this merits surveillance. Musculoskeletal: Dextroconvex thoracic scoliosis.  Left mastectomy. CT ABDOMEN PELVIS FINDINGS Hepatobiliary: Contracted gallbladder.  Otherwise unremarkable. Pancreas: Unremarkable Spleen: Unremarkable Adrenals/Urinary Tract: Unremarkable Stomach/Bowel: Sigmoid diverticulosis without active diverticulitis. Vascular/Lymphatic: Aortoiliac atherosclerotic vascular disease. Right renal artery stent. 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 No pathologic adenopathy observed. Reproductive: Unremarkable Other: No supplemental non-categorized findings. Musculoskeletal: Mild kyphotic curvature centered at approximately L1 without compression fracture. There is lumbar spine spondylosis and degenerative disc disease resulting in  some mild foraminal impingement are notably on the right at L3-4 and L4-5. No findings of skeletal metastatic disease. IMPRESSION: 1. Significant reduction in size of the right hilar mass, mediastinal adenopathy, and peripheral right hilar nodule compared to the prior PET-CT of 02/07/2017 indicating interval effective therapy. 2. No findings of new metastatic disease or involvement of the abdomen/pelvis or skeleton. 3. Other imaging findings of potential clinical significance: Aortic Atherosclerosis (ICD10-I70.0) and Emphysema (ICD10-J43.9). Coronary atherosclerosis. Sigmoid diverticulosis. 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 Lumbar impingement due to spondylosis and degenerative disc disease. Electronically Signed   By: Van Clines M.D.   On: 04/06/2017 12:25   Ct Abdomen Pelvis W Contrast  Result Date: 04/06/2017 CLINICAL DATA:  Restaging of right upper lobe small cell lung cancer, initial diagnosis September 2018, status post chemotherapy and radiation therapy. EXAM: CT CHEST, ABDOMEN, AND PELVIS WITH CONTRAST TECHNIQUE: Multidetector CT imaging of the chest, abdomen and pelvis was performed following the standard protocol during bolus administration of intravenous contrast. CONTRAST:  177mL ISOVUE-300 IOPAMIDOL (ISOVUE-300) INJECTION 61% COMPARISON:  Multiple exams, including PET-CT from 02/07/2017 FINDINGS: CT CHEST FINDINGS Cardiovascular: Left IJ line tip: Right atrium. Coronary, aortic arch, and branch vessel atherosclerotic vascular disease. Mediastinum/Nodes: Significant reduction in the right suprahilar mass, right hilar adenopathy, and lower paratracheal adenopathy. A low-density right lower paratracheal node measures 1.4 cm in short axis on image 22/2 and previously measured 2.2 cm. This lesion is also less dense than on the prior exam. A right hilar lymph node measures 1.6 cm in short axis on image 24/2, previous 2.2 cm. Lungs/Pleura: The right hilar mass  measures approximately 2.0 by 2.4 cm on image 23/2, new formerly approximately 3.7 by 4.8 cm. This also demonstrates reduced density compared the prior exam. The hypermetabolic peripheral lesion in the right upper lobe measures 1.6 by 1.2 cm on image 31/4, previously 2.2 by 1.8 cm. Biapical pleuroparenchymal scarring. Right paramediastinal density likely due to atelectasis. Paraseptal emphysema. Bandlike left upper lobe subpleural density on images 39-40 series 4 is similar to prior and was not previously hypermetabolic, this merits surveillance. Musculoskeletal: Dextroconvex thoracic scoliosis.  Left mastectomy. CT ABDOMEN PELVIS FINDINGS Hepatobiliary: Contracted gallbladder.  Otherwise unremarkable. Pancreas: Unremarkable Spleen: Unremarkable Adrenals/Urinary Tract: Unremarkable Stomach/Bowel: Sigmoid diverticulosis without active diverticulitis. Vascular/Lymphatic: Aortoiliac atherosclerotic vascular disease. Right renal artery stent. Infrarenal abdominal aortic stent graft extending through an infrarenal aneurysm, with patent by iliac limbs of the stent graft, the thrombosed excluded aneurysm measures 4.6 by 4.3 cm on image 69/2 and  I do not see obvious signs of an endoleak. No pathologic adenopathy observed. Reproductive: Unremarkable Other: No supplemental non-categorized findings. Musculoskeletal: Mild kyphotic curvature centered at approximately L1 without compression fracture. There is lumbar spine spondylosis and degenerative disc disease resulting in some mild foraminal impingement are notably on the right at L3-4 and L4-5. No findings of skeletal metastatic disease. IMPRESSION: 1. Significant reduction in size of the right hilar mass, mediastinal adenopathy, and peripheral right hilar nodule compared to the prior PET-CT of 02/07/2017 indicating interval effective therapy. 2. No findings of new metastatic disease or involvement of the abdomen/pelvis or skeleton. 3. Other imaging findings of potential  clinical significance: Aortic Atherosclerosis (ICD10-I70.0) and Emphysema (ICD10-J43.9). Coronary atherosclerosis. Sigmoid diverticulosis. 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 Lumbar impingement due to spondylosis and degenerative disc disease. Electronically Signed   By: Van Clines M.D.   On: 04/06/2017 12:25     Assessment and plan- Patient is a 71 y.o. 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  Overall she has had a good response to chemoradiation.  There has been significant reduction in the size of her right hilar mass as well as mediastinal adenopathy.  There is no new metastatic disease noted at this time.  Plan is to complete 2 more cycles of cisplatin and etoposide at this time.  Patient was neutropenic last week but her counts have now normalized.  Okay to proceed with cycle #3 of cisplatin and etoposide today.  I will see her back in 3 weeks time with repeat CBC and CMP for cycle #4 of cisplatin and etoposide.  She will be completing her radiation next week and therefore I will consider giving her Neulasta with cycle 4 of chemotherapy  Hypotension: She is asymptomatic and she will be receiving fluids before and after cisplatin.  We will also plan to give her 1 extra liter of fluid today.  We will reassess how she is doing next week and schedule her for possible IV fluids next week at [facility]  Hypokalemia: 20 IV potassium today with IVF  Cough: Recent CT scan done 10 days ago did not show any evidence of pneumonia but given that her symptoms are not getting better with Robitussin I will empirically give her Augmentin for 7 days.  I will see her back in 3 weeks time with repeat CBC and CMP for cycle #4 of cisplatin and  etoposide with possible Neulasta.  Plans to repeat scans after 4 cycles.  She will be done with her radiation next week    Visit Diagnosis 1. Small cell lung cancer, right upper lobe ([place])   2. Encounter for antineoplastic chemotherapy      Dr. Randa Evens, MD, MPH [facility] at [facility] Pager- [phone] 04/16/2017 8:38 AM

## 2017-04-17 ENCOUNTER — Ambulatory Visit
Admission: RE | Admit: 2017-04-17 | Discharge: 2017-04-17 | Disposition: A | Discharge status: Home / Self Care | Payer: Medicare Other | Source: Ambulatory Visit | Attending: Radiation Oncology | Admitting: Radiation Oncology

## 2017-04-17 ENCOUNTER — Inpatient Hospital Stay: Payer: Medicare Other

## 2017-04-17 VITALS — BP 127/68 | HR 99 | Temp 97.1°F | Resp 18 | Wt 136.0 lb

## 2017-04-17 DIAGNOSIS — C3411 Malignant neoplasm of upper lobe, right bronchus or lung: Secondary | ICD-10-CM

## 2017-04-17 DIAGNOSIS — Z510 Encounter for antineoplastic radiation therapy: Secondary | ICD-10-CM | POA: Diagnosis not present

## 2017-04-17 DIAGNOSIS — Z5111 Encounter for antineoplastic chemotherapy: Secondary | ICD-10-CM | POA: Diagnosis not present

## 2017-04-17 MED ORDER — HEPARIN SOD (PORK) LOCK FLUSH 100 UNIT/ML IV SOLN
500.0000 [IU] | Freq: Once | INTRAVENOUS | Status: AC | PRN
  Administered 2017-04-17: 500 [IU]
  Filled 2017-04-17: qty 5

## 2017-04-17 MED ORDER — SODIUM CHLORIDE 0.9 % IV SOLN
100.0000 mg/m2 | Freq: Once | INTRAVENOUS | Status: AC
  Administered 2017-04-17: 170 mg via INTRAVENOUS
  Filled 2017-04-17: qty 8.5

## 2017-04-17 MED ORDER — SODIUM CHLORIDE 0.9% FLUSH
10.0000 mL | INTRAVENOUS | Status: DC | PRN
  Administered 2017-04-17: 10 mL
  Filled 2017-04-17: qty 10

## 2017-04-17 MED ORDER — SODIUM CHLORIDE 0.9 % IV SOLN
Freq: Once | INTRAVENOUS | Status: AC
Start: 2017-04-17 — End: 2017-04-17
  Administered 2017-04-17: 14:00:00 via INTRAVENOUS
  Filled 2017-04-17: qty 1000

## 2017-04-17 MED ORDER — DEXAMETHASONE SODIUM PHOSPHATE 10 MG/ML IJ SOLN
10.0000 mg | Freq: Once | INTRAMUSCULAR | Status: AC
  Administered 2017-04-17: 10 mg via INTRAVENOUS
  Filled 2017-04-17: qty 1

## 2017-04-18 ENCOUNTER — Ambulatory Visit: Payer: Medicare Other

## 2017-04-18 ENCOUNTER — Inpatient Hospital Stay: Payer: Medicare Other

## 2017-04-18 ENCOUNTER — Ambulatory Visit
Admission: RE | Admit: 2017-04-18 | Discharge: 2017-04-18 | Disposition: A | Discharge status: Home / Self Care | Payer: Medicare Other | Source: Ambulatory Visit | Attending: Radiation Oncology | Admitting: Radiation Oncology

## 2017-04-18 VITALS — BP 109/66 | HR 103 | Temp 98.0°F | Resp 18

## 2017-04-18 DIAGNOSIS — C3411 Malignant neoplasm of upper lobe, right bronchus or lung: Secondary | ICD-10-CM

## 2017-04-18 DIAGNOSIS — Z510 Encounter for antineoplastic radiation therapy: Secondary | ICD-10-CM | POA: Diagnosis not present

## 2017-04-18 DIAGNOSIS — Z5111 Encounter for antineoplastic chemotherapy: Secondary | ICD-10-CM | POA: Diagnosis not present

## 2017-04-18 MED ORDER — DEXAMETHASONE SODIUM PHOSPHATE 10 MG/ML IJ SOLN
10.0000 mg | Freq: Once | INTRAMUSCULAR | Status: AC
  Administered 2017-04-18: 10 mg via INTRAVENOUS
  Filled 2017-04-18: qty 1

## 2017-04-18 MED ORDER — HEPARIN SOD (PORK) LOCK FLUSH 100 UNIT/ML IV SOLN
500.0000 [IU] | Freq: Once | INTRAVENOUS | Status: AC | PRN
  Administered 2017-04-18: 500 [IU]
  Filled 2017-04-18: qty 5

## 2017-04-18 MED ORDER — SODIUM CHLORIDE 0.9% FLUSH
10.0000 mL | INTRAVENOUS | Status: DC | PRN
  Administered 2017-04-18: 10 mL
  Filled 2017-04-18: qty 10

## 2017-04-18 MED ORDER — SODIUM CHLORIDE 0.9 % IV SOLN
Freq: Once | INTRAVENOUS | Status: AC
Start: 2017-04-18 — End: 2017-04-18
  Administered 2017-04-18: 14:00:00 via INTRAVENOUS
  Filled 2017-04-18: qty 1000

## 2017-04-18 MED ORDER — SODIUM CHLORIDE 0.9 % IV SOLN
100.0000 mg/m2 | Freq: Once | INTRAVENOUS | Status: AC
  Administered 2017-04-18: 170 mg via INTRAVENOUS
  Filled 2017-04-18: qty 8.5

## 2017-04-23 ENCOUNTER — Ambulatory Visit
Admission: RE | Admit: 2017-04-23 | Discharge: 2017-04-23 | Disposition: A | Discharge status: Home / Self Care | Payer: Medicare Other | Source: Ambulatory Visit | Attending: Radiation Oncology | Admitting: Radiation Oncology

## 2017-04-23 ENCOUNTER — Encounter: Payer: Self-pay | Admitting: Oncology

## 2017-04-23 ENCOUNTER — Inpatient Hospital Stay: Payer: Medicare Other

## 2017-04-23 ENCOUNTER — Inpatient Hospital Stay (HOSPITAL_BASED_OUTPATIENT_CLINIC_OR_DEPARTMENT_OTHER): Payer: Medicare Other | Admitting: Oncology

## 2017-04-23 VITALS — BP 89/59 | HR 114 | Temp 98.0°F | Resp 16

## 2017-04-23 DIAGNOSIS — C3411 Malignant neoplasm of upper lobe, right bronchus or lung: Secondary | ICD-10-CM

## 2017-04-23 DIAGNOSIS — F17210 Nicotine dependence, cigarettes, uncomplicated: Secondary | ICD-10-CM

## 2017-04-23 DIAGNOSIS — R000 Tachycardia, unspecified: Secondary | ICD-10-CM | POA: Diagnosis not present

## 2017-04-23 DIAGNOSIS — E876 Hypokalemia: Secondary | ICD-10-CM

## 2017-04-23 DIAGNOSIS — E861 Hypovolemia: Secondary | ICD-10-CM

## 2017-04-23 DIAGNOSIS — I9589 Other hypotension: Secondary | ICD-10-CM | POA: Diagnosis not present

## 2017-04-23 DIAGNOSIS — Z79899 Other long term (current) drug therapy: Secondary | ICD-10-CM | POA: Diagnosis not present

## 2017-04-23 DIAGNOSIS — Z5111 Encounter for antineoplastic chemotherapy: Secondary | ICD-10-CM | POA: Diagnosis not present

## 2017-04-23 DIAGNOSIS — Z9012 Acquired absence of left breast and nipple: Secondary | ICD-10-CM

## 2017-04-23 DIAGNOSIS — Z510 Encounter for antineoplastic radiation therapy: Secondary | ICD-10-CM | POA: Diagnosis not present

## 2017-04-23 LAB — BASIC METABOLIC PANEL
ANION GAP: 10 (ref 5–15)
BUN: 21 mg/dL — AB (ref 6–20)
CALCIUM: 7.9 mg/dL — AB (ref 8.9–10.3)
CO2: 29 mmol/L (ref 22–32)
Chloride: 93 mmol/L — ABNORMAL LOW (ref 101–111)
Creatinine, Ser: 0.8 mg/dL (ref 0.44–1.00)
GFR calc Af Amer: >60 mL/min (ref >60)
GLUCOSE: 127 mg/dL — AB (ref 65–99)
Potassium: 3.1 mmol/L — ABNORMAL LOW (ref 3.5–5.1)
Sodium: 132 mmol/L — ABNORMAL LOW (ref 135–145)

## 2017-04-23 LAB — CBC WITH DIFFERENTIAL/PLATELET
BASOS ABS: 0 10*3/uL (ref 0–0.1)
Basophils Relative: 1 %
Eosinophils Absolute: 0 10*3/uL (ref 0–0.7)
Eosinophils Relative: 0 %
HCT: 32.6 % — ABNORMAL LOW (ref 35.0–47.0)
Hemoglobin: 10.8 g/dL — ABNORMAL LOW (ref 12.0–16.0)
LYMPHS ABS: 0.3 10*3/uL — AB (ref 1.0–3.6)
Lymphocytes Relative: 9 %
MCH: 28.7 pg (ref 26.0–34.0)
MCHC: 33.2 g/dL (ref 32.0–36.0)
MCV: 86.4 fL (ref 80.0–100.0)
MONO ABS: 0 10*3/uL — AB (ref 0.2–0.9)
MONOS PCT: 0 %
NEUTROS ABS: 2.7 10*3/uL (ref 1.4–6.5)
Neutrophils Relative %: 90 %
PLATELETS: 163 10*3/uL (ref 150–440)
RBC: 3.78 MIL/uL — ABNORMAL LOW (ref 3.80–5.20)
RDW: 15.9 % — AB (ref 11.5–14.5)
WBC: 3 10*3/uL — ABNORMAL LOW (ref 3.6–11.0)

## 2017-04-23 MED ORDER — SODIUM CHLORIDE 0.9% FLUSH
10.0000 mL | INTRAVENOUS | Status: DC | PRN
  Administered 2017-04-23: 10 mL via INTRAVENOUS
  Filled 2017-04-23: qty 10

## 2017-04-23 MED ORDER — HEPARIN SOD (PORK) LOCK FLUSH 100 UNIT/ML IV SOLN
500.0000 [IU] | Freq: Once | INTRAVENOUS | Status: AC
  Administered 2017-04-23: 500 [IU] via INTRAVENOUS

## 2017-04-23 MED ORDER — HEPARIN SOD (PORK) LOCK FLUSH 100 UNIT/ML IV SOLN
INTRAVENOUS | Status: AC
  Filled 2017-04-23: qty 5

## 2017-04-23 MED ORDER — SODIUM CHLORIDE 0.9 % IV SOLN
Freq: Once | INTRAVENOUS | Status: AC
  Administered 2017-04-23: 10:00:00 via INTRAVENOUS
  Filled 2017-04-23: qty 1000

## 2017-04-23 MED ORDER — POTASSIUM CHLORIDE IN NACL 40-0.9 MEQ/L-% IV SOLN: Freq: Once | INTRAVENOUS | Status: DC

## 2017-04-23 NOTE — Progress Notes (Signed)
Patient here today for follow up with lab and possible IV fluid/potassium. She states that she is feeling better today and denies having any pain. She will finish her antibiotic tonight.

## 2017-04-23 NOTE — Patient Instructions (Signed)

## 2017-04-23 NOTE — Progress Notes (Signed)
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 Patient is a 71 year old female with a long-standing history of smoking and smoked 1 pack per day for more than 50 years and is now down to half a pack per day. She also has COPD and recently had abdominal aortic aneurysm status post stenting. She presented with symptoms of mild hemoptysis in August 2018 which prompted CT chest  2. CT chest on 01/23/2017 showed:IMPRESSION: 1. Large right suprahilar mass extending into the mediastinum consistent with primary lung carcinoma with mediastinal extension. 2. Partial atelectasis of right upper lobe due to this large suprahilar mass with probable postobstructive pneumonitis in the right upper lobe peripherally. 3. Small right pleural effusion. 4. Severe thoracic aortic atherosclerosis. Diffuse coronary artery Calcifications.   3. This was followed by a PET CT scan on 02/07/2017 showed:IMPRESSION: 1. Hypermetabolic disease in the upper right hilum and mediastinum compatible with neoplasm. 2. Mild FDG accumulation in peripheral areas of patchy airspace disease which may be secondary sites of neoplasm or related to infection/inflammation. 3. No evidence for distant metastases in the neck, abdomen, or pelvis.   4. Patient underwent bronchoscopy guided biopsy with Dr. Mortimer Fries. Pathology showed small cell lung cancer. MRI brain negative for metastatic disease  5. Patient lives with her  sister and is independent of her ADLs. She reports occasional blood-tinged sputum but no significant frank hemoptysis. Her appetite is good and she denies any unintentional weight loss. She denies any pain. She does have a history of breast cancer in 2002 status post left mastectomy followed by adjuvant chemotherapy. This was treated by Dr. Oliva Bustard. She did not receive any adjuvant radiation.  6. Cycle 1 of cis/etoposide started on 03/05/17    Interval history- she is still having some cough with clear sputum. Reports sob is at baseline and stable with inhalers. No fevers. Family has noticed that her hearing is somewhat diminished  ECOG PS- 1 Pain scale- 0 Opioid associated constipation- no  Review of systems- Review of Systems  Constitutional: Positive for malaise/fatigue. Negative for chills, fever and weight loss.  HENT: Negative for congestion, ear discharge and nosebleeds.   Eyes: Negative for blurred vision.  Respiratory: Positive for sputum production and shortness of breath. Negative for cough, hemoptysis and wheezing.   Cardiovascular: Negative for chest pain, palpitations, orthopnea and claudication.  Gastrointestinal: Negative for abdominal pain, blood in stool, constipation, diarrhea, heartburn, melena, nausea and vomiting.  Genitourinary: Negative for dysuria, flank pain, frequency, hematuria and urgency.  Musculoskeletal: Negative for back pain, joint pain and myalgias.  Skin: Negative for rash.  Neurological: Negative for dizziness, tingling, focal weakness, seizures, weakness and headaches.  Endo/Heme/Allergies: Does not bruise/bleed easily.  Psychiatric/Behavioral: Negative for depression and suicidal ideas. The patient does not have insomnia.       No Known Allergies   Past Medical History:  Diagnosis Date  . AAA (abdominal aortic aneurysm) ([place])   . Breast cancer ([place]) 2002   left  . COPD (chronic obstructive pulmonary disease) ([place])   . High  cholesterol   .  Personal history of chemotherapy   . Small cell lung cancer ([place])   . Tobacco abuse      Past Surgical History:  Procedure Laterality Date  . ABDOMINAL HYSTERECTOMY    . ENDOBRONCHIAL ULTRASOUND N/A 02/19/2017   Procedure: ENDOBRONCHIAL ULTRASOUND;  Surgeon: Flora Lipps, MD;  Location: [facility] ORS;  Service: Cardiopulmonary;  Laterality: N/A;  . ENDOVASCULAR REPAIR/STENT GRAFT N/A 12/06/2016   Procedure: Endovascular Repair/Stent Graft;  Surgeon: Katha Cabal, MD;  Location: [facility];  Service: Cardiovascular;  Laterality: N/A;  . IR FLUORO GUIDE PORT INSERTION RIGHT  03/02/2017  . MASTECTOMY Left 2002   with sentinel node     Social History   Socioeconomic History  . Marital status: Widowed    Spouse name: Not on file  . Number of children: Not on file  . Years of education: Not on file  . Highest education level: Not on file  Social Needs  . Financial resource strain: Not on file  . Food insecurity - worry: Not on file  . Food insecurity - inability: Not on file  . Transportation needs - medical: Not on file  . Transportation needs - non-medical: Not on file  Occupational History  . Not on file  Tobacco Use  . Smoking status: Current Every Day Smoker    Packs/day: 0.50    Types: Cigarettes  . Smokeless tobacco: Never Used  Substance and Sexual Activity  . Alcohol use: No  . Drug use: No  . Sexual activity: No  Other Topics Concern  . Not on file  Social History Narrative  . Not on file    Family History  Problem Relation Age of Onset  . Breast cancer Neg Hx      Current Outpatient Medications:  .  acetaminophen (TYLENOL) 500 MG tablet, Take 1,000 mg by mouth every 6 (six) hours as needed (for pain.)., Disp: , Rfl:  .  albuterol (PROVENTIL HFA;VENTOLIN HFA) 108 (90 Base) MCG/ACT inhaler, Inhale 2 puffs into the lungs every 6 (six) hours as needed for wheezing or shortness of breath., Disp: 1 Inhaler, Rfl: 1 .  amoxicillin-clavulanate (AUGMENTIN)  875-125 MG tablet, Take 1 tablet 2 (two) times daily by mouth., Disp: 14 tablet, Rfl: 0 .  aspirin EC 81 MG tablet, Take 81 mg by mouth daily., Disp: , Rfl:  .  atorvastatin (LIPITOR) 10 MG tablet, Take 10 mg by mouth at bedtime. , Disp: , Rfl:  .  dexamethasone (DECADRON) 4 MG tablet, Take 2 tablets two times a day for 1 day on day 4 after cisplatin chemotherapy. Take with food., Disp: 30 tablet, Rfl: 1 .  fluticasone-salmeterol (ADVAIR HFA) 115-21 MCG/ACT inhaler, Inhale 2 puffs into the lungs 2 (two) times daily., Disp: , Rfl:  .  guaifenesin (ROBITUSSIN) 100 MG/5ML syrup, Take 200 mg 3 (three) times daily as needed by mouth for cough., Disp: , Rfl:  .  ipratropium (ATROVENT) 0.02 % nebulizer solution, Take 0.5 mg by nebulization 4 (four) times daily., Disp: , Rfl:  .  lidocaine-prilocaine (EMLA) cream, Apply to affected area once (Patient not taking: Reported on 03/13/2017), Disp: 30 g, Rfl: 3 .  LORazepam (ATIVAN) 0.5 MG tablet, Take 1 tablet (0.5 mg total) by mouth every 6 (six) hours as needed (Nausea or vomiting). (Patient not taking: Reported on 03/13/2017), Disp: 30 tablet, Rfl: 0 .  ondansetron (ZOFRAN) 8 MG tablet, Take 1 tablet (8 mg total) by mouth 2 (two) times daily as  needed. Start on the third day after cisplatin chemotherapy., Disp: 30 tablet, Rfl: 1 .  oxyCODONE (OXY IR/ROXICODONE) 5 MG immediate release tablet, Take 1 tablet (5 mg total) by mouth every 6 (six) hours as needed for severe pain., Disp: 30 tablet, Rfl: 0 .  prochlorperazine (COMPAZINE) 10 MG tablet, Take 1 tablet (10 mg total) by mouth every 6 (six) hours as needed (Nausea or vomiting). (Patient not taking: Reported on 03/02/2017), Disp: 30 tablet, Rfl: 1 .  sucralfate (CARAFATE) 1 g tablet, Take 1 tablet (1 g total) 4 (four) times daily -  with meals and at bedtime by mouth. dissolve in 8 oz of water, Disp: 90 tablet, Rfl: 0 No current facility-administered medications for this visit.   Facility-Administered  Medications Ordered in Other Visits:  .  sodium chloride flush (NS) 0.9 % injection 10 mL, 10 mL, Intravenous, PRN, Sindy Guadeloupe, MD, 10 mL at 03/26/17 0948 .  sodium chloride flush (NS) 0.9 % injection 10 mL, 10 mL, Intravenous, PRN, Sindy Guadeloupe, MD, 10 mL at 04/16/17 1006  Physical exam:  Vitals:   04/23/17 0921  BP: (!) 89/59  Pulse: (!) 114  Resp: 16  Temp: 98 F (36.7 C)  TempSrc: Tympanic   Physical Exam  Constitutional: She is oriented to person, place, and time and well-developed, well-nourished, and in no distress.  HENT:  Head: Normocephalic and atraumatic.  Eyes: EOM are normal. Pupils are equal, round, and reactive to light.  Neck: Normal range of motion.  Cardiovascular: Regular rhythm and normal heart sounds.  tachycardic  Pulmonary/Chest: Effort normal.  Scattered b/l rhonchi  Abdominal: Soft. Bowel sounds are normal.  Neurological: She is alert and oriented to person, place, and time.  Skin: Skin is warm and dry.     CMP Latest Ref Rng & Units 04/23/2017  Glucose 65 - 99 mg/dL 127(H)  BUN 6 - 20 mg/dL 21(H)  Creatinine 0.44 - 1.00 mg/dL 0.80  Sodium 135 - 145 mmol/L 132(L)  Potassium 3.5 - 5.1 mmol/L 3.1(L)  Chloride 101 - 111 mmol/L 93(L)  CO2 22 - 32 mmol/L 29  Calcium 8.9 - 10.3 mg/dL 7.9(L)  Total Protein 6.5 - 8.1 g/dL -  Total Bilirubin 0.3 - 1.2 mg/dL -  Alkaline Phos 38 - 126 U/L -  AST 15 - 41 U/L -  ALT 14 - 54 U/L -   CBC Latest Ref Rng & Units 04/23/2017  WBC 3.6 - 11.0 K/uL 3.0(L)  Hemoglobin 12.0 - 16.0 g/dL 10.8(L)  Hematocrit 35.0 - 47.0 % 32.6(L)  Platelets 150 - 440 K/uL 163    No images are attached to the encounter.  Ct Chest W Contrast  Result Date: 04/06/2017 CLINICAL DATA:  Restaging of right upper lobe small cell lung cancer, initial diagnosis September 2018, status post chemotherapy and radiation therapy. EXAM: CT CHEST, ABDOMEN, AND PELVIS WITH CONTRAST TECHNIQUE: Multidetector CT imaging of the chest, abdomen and  pelvis was performed following the standard protocol during bolus administration of intravenous contrast. CONTRAST:  170mL ISOVUE-300 IOPAMIDOL (ISOVUE-300) INJECTION 61% COMPARISON:  Multiple exams, including PET-CT from 02/07/2017 FINDINGS: CT CHEST FINDINGS Cardiovascular: Left IJ line tip: Right atrium. Coronary, aortic arch, and branch vessel atherosclerotic vascular disease. Mediastinum/Nodes: Significant reduction in the right suprahilar mass, right hilar adenopathy, and lower paratracheal adenopathy. A low-density right lower paratracheal node measures 1.4 cm in short axis on image 22/2 and previously measured 2.2 cm. This lesion is also less dense than on the prior exam. A  right hilar lymph node measures 1.6 cm in short axis on image 24/2, previous 2.2 cm. Lungs/Pleura: The right hilar mass measures approximately 2.0 by 2.4 cm on image 23/2, new formerly approximately 3.7 by 4.8 cm. This also demonstrates reduced density compared the prior exam. The hypermetabolic peripheral lesion in the right upper lobe measures 1.6 by 1.2 cm on image 31/4, previously 2.2 by 1.8 cm. Biapical pleuroparenchymal scarring. Right paramediastinal density likely due to atelectasis. Paraseptal emphysema. Bandlike left upper lobe subpleural density on images 39-40 series 4 is similar to prior and was not previously hypermetabolic, this merits surveillance. Musculoskeletal: Dextroconvex thoracic scoliosis.  Left mastectomy. CT ABDOMEN PELVIS FINDINGS Hepatobiliary: Contracted gallbladder.  Otherwise unremarkable. Pancreas: Unremarkable Spleen: Unremarkable Adrenals/Urinary Tract: Unremarkable Stomach/Bowel: Sigmoid diverticulosis without active diverticulitis. Vascular/Lymphatic: Aortoiliac atherosclerotic vascular disease. Right renal artery stent. Infrarenal abdominal aortic stent graft extending through an infrarenal aneurysm, with patent by iliac limbs of the stent graft, the thrombosed excluded aneurysm measures 4.6 by 4.3 cm  on image 69/2 and I do not see obvious signs of an endoleak. No pathologic adenopathy observed. Reproductive: Unremarkable Other: No supplemental non-categorized findings. Musculoskeletal: Mild kyphotic curvature centered at approximately L1 without compression fracture. There is lumbar spine spondylosis and degenerative disc disease resulting in some mild foraminal impingement are notably on the right at L3-4 and L4-5. No findings of skeletal metastatic disease. IMPRESSION: 1. Significant reduction in size of the right hilar mass, mediastinal adenopathy, and peripheral right hilar nodule compared to the prior PET-CT of 02/07/2017 indicating interval effective therapy. 2. No findings of new metastatic disease or involvement of the abdomen/pelvis or skeleton. 3. Other imaging findings of potential clinical significance: Aortic Atherosclerosis (ICD10-I70.0) and Emphysema (ICD10-J43.9). Coronary atherosclerosis. Sigmoid diverticulosis. 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 Lumbar impingement due to spondylosis and degenerative disc disease. Electronically Signed   By: Van Clines M.D.   On: 04/06/2017 12:25   Ct Abdomen Pelvis W Contrast  Result Date: 04/06/2017 CLINICAL DATA:  Restaging of right upper lobe small cell lung cancer, initial diagnosis September 2018, status post chemotherapy and radiation therapy. EXAM: CT CHEST, ABDOMEN, AND PELVIS WITH CONTRAST TECHNIQUE: Multidetector CT imaging of the chest, abdomen and pelvis was performed following the standard protocol during bolus administration of intravenous contrast. CONTRAST:  122mL ISOVUE-300 IOPAMIDOL (ISOVUE-300) INJECTION 61% COMPARISON:  Multiple exams, including PET-CT from 02/07/2017 FINDINGS: CT CHEST FINDINGS Cardiovascular: Left IJ line tip: Right atrium. Coronary, aortic arch, and branch vessel atherosclerotic vascular disease. Mediastinum/Nodes: Significant reduction in the right suprahilar mass, right  hilar adenopathy, and lower paratracheal adenopathy. A low-density right lower paratracheal node measures 1.4 cm in short axis on image 22/2 and previously measured 2.2 cm. This lesion is also less dense than on the prior exam. A right hilar lymph node measures 1.6 cm in short axis on image 24/2, previous 2.2 cm. Lungs/Pleura: The right hilar mass measures approximately 2.0 by 2.4 cm on image 23/2, new formerly approximately 3.7 by 4.8 cm. This also demonstrates reduced density compared the prior exam. The hypermetabolic peripheral lesion in the right upper lobe measures 1.6 by 1.2 cm on image 31/4, previously 2.2 by 1.8 cm. Biapical pleuroparenchymal scarring. Right paramediastinal density likely due to atelectasis. Paraseptal emphysema. Bandlike left upper lobe subpleural density on images 39-40 series 4 is similar to prior and was not previously hypermetabolic, this merits surveillance. Musculoskeletal: Dextroconvex thoracic scoliosis.  Left mastectomy. CT ABDOMEN PELVIS FINDINGS Hepatobiliary: Contracted gallbladder.  Otherwise unremarkable. Pancreas: Unremarkable Spleen:  Unremarkable Adrenals/Urinary Tract: Unremarkable Stomach/Bowel: Sigmoid diverticulosis without active diverticulitis. Vascular/Lymphatic: Aortoiliac atherosclerotic vascular disease. Right renal artery stent. 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 No pathologic adenopathy observed. Reproductive: Unremarkable Other: No supplemental non-categorized findings. Musculoskeletal: Mild kyphotic curvature centered at approximately L1 without compression fracture. There is lumbar spine spondylosis and degenerative disc disease resulting in some mild foraminal impingement are notably on the right at L3-4 and L4-5. No findings of skeletal metastatic  disease. IMPRESSION: 1. Significant reduction in size of the right hilar mass, mediastinal adenopathy, and peripheral right hilar nodule compared to the prior PET-CT of 02/07/2017 indicating interval effective therapy. 2. No findings of new metastatic disease or involvement of the abdomen/pelvis or skeleton. 3. Other imaging findings of potential clinical significance: Aortic Atherosclerosis (ICD10-I70.0) and Emphysema (ICD10-J43.9). Coronary atherosclerosis. Sigmoid diverticulosis. 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 Lumbar impingement due to spondylosis and degenerative disc disease. Electronically Signed   By: Van Clines M.D.   On: 04/06/2017 12:25     Assessment and plan- Patient is a 71 y.o. 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 Will give 1L NS and 40 meq potassium. She will rtc in 1 week for possible fluids and labs cbc/bmp. She was prescribed augmentin last week and 1 more dose remaining  She will be completing her radiation today and has 1 more cycle of chemotherapy left in 2 weeks time. Given her hearing loss, I will switch her to carboplatin for cycle 4. Plan to repeat scans after 4 cycles of treatment    Visit Diagnosis 1. Small cell lung cancer, right upper lobe ([place])   2. Hypotension due to hypovolemia   3. Hypokalemia      Dr. Randa Evens, MD, MPH [facility] Pager- [phone] 04/23/2017 9:39 AM

## 2017-04-28 ENCOUNTER — Other Ambulatory Visit: Payer: Self-pay

## 2017-04-28 ENCOUNTER — Inpatient Hospital Stay
Admission: EM | Admit: 2017-04-28 | Discharge: 2017-05-02 | DRG: 871 | Disposition: A | Discharge status: Home / Self Care | Payer: Medicare Other | Attending: Internal Medicine | Admitting: Internal Medicine

## 2017-04-28 ENCOUNTER — Emergency Department: Payer: Medicare Other

## 2017-04-28 DIAGNOSIS — R6520 Severe sepsis without septic shock: Secondary | ICD-10-CM

## 2017-04-28 DIAGNOSIS — A419 Sepsis, unspecified organism: Principal | ICD-10-CM

## 2017-04-28 DIAGNOSIS — I739 Peripheral vascular disease, unspecified: Secondary | ICD-10-CM | POA: Diagnosis present

## 2017-04-28 DIAGNOSIS — Z9012 Acquired absence of left breast and nipple: Secondary | ICD-10-CM

## 2017-04-28 DIAGNOSIS — E7800 Pure hypercholesterolemia, unspecified: Secondary | ICD-10-CM | POA: Diagnosis present

## 2017-04-28 DIAGNOSIS — D701 Agranulocytosis secondary to cancer chemotherapy: Secondary | ICD-10-CM

## 2017-04-28 DIAGNOSIS — D61810 Antineoplastic chemotherapy induced pancytopenia: Secondary | ICD-10-CM | POA: Diagnosis present

## 2017-04-28 DIAGNOSIS — Z853 Personal history of malignant neoplasm of breast: Secondary | ICD-10-CM | POA: Diagnosis not present

## 2017-04-28 DIAGNOSIS — F17210 Nicotine dependence, cigarettes, uncomplicated: Secondary | ICD-10-CM | POA: Diagnosis present

## 2017-04-28 DIAGNOSIS — C3411 Malignant neoplasm of upper lobe, right bronchus or lung: Secondary | ICD-10-CM | POA: Diagnosis present

## 2017-04-28 DIAGNOSIS — R6521 Severe sepsis with septic shock: Secondary | ICD-10-CM | POA: Diagnosis present

## 2017-04-28 DIAGNOSIS — Z90710 Acquired absence of both cervix and uterus: Secondary | ICD-10-CM | POA: Diagnosis not present

## 2017-04-28 DIAGNOSIS — E872 Acidosis: Secondary | ICD-10-CM | POA: Diagnosis present

## 2017-04-28 DIAGNOSIS — E871 Hypo-osmolality and hyponatremia: Secondary | ICD-10-CM | POA: Diagnosis present

## 2017-04-28 DIAGNOSIS — E538 Deficiency of other specified B group vitamins: Secondary | ICD-10-CM | POA: Diagnosis present

## 2017-04-28 DIAGNOSIS — Z79899 Other long term (current) drug therapy: Secondary | ICD-10-CM

## 2017-04-28 DIAGNOSIS — E43 Unspecified severe protein-calorie malnutrition: Secondary | ICD-10-CM | POA: Diagnosis present

## 2017-04-28 DIAGNOSIS — Z95828 Presence of other vascular implants and grafts: Secondary | ICD-10-CM

## 2017-04-28 DIAGNOSIS — Z716 Tobacco abuse counseling: Secondary | ICD-10-CM

## 2017-04-28 DIAGNOSIS — J449 Chronic obstructive pulmonary disease, unspecified: Secondary | ICD-10-CM | POA: Diagnosis present

## 2017-04-28 DIAGNOSIS — T451X5S Adverse effect of antineoplastic and immunosuppressive drugs, sequela: Secondary | ICD-10-CM | POA: Diagnosis not present

## 2017-04-28 DIAGNOSIS — D702 Other drug-induced agranulocytosis: Secondary | ICD-10-CM | POA: Diagnosis not present

## 2017-04-28 DIAGNOSIS — D709 Neutropenia, unspecified: Secondary | ICD-10-CM | POA: Diagnosis not present

## 2017-04-28 DIAGNOSIS — D638 Anemia in other chronic diseases classified elsewhere: Secondary | ICD-10-CM | POA: Diagnosis present

## 2017-04-28 DIAGNOSIS — R5081 Fever presenting with conditions classified elsewhere: Secondary | ICD-10-CM | POA: Diagnosis present

## 2017-04-28 DIAGNOSIS — E785 Hyperlipidemia, unspecified: Secondary | ICD-10-CM | POA: Diagnosis present

## 2017-04-28 DIAGNOSIS — E876 Hypokalemia: Secondary | ICD-10-CM | POA: Diagnosis present

## 2017-04-28 DIAGNOSIS — I714 Abdominal aortic aneurysm, without rupture: Secondary | ICD-10-CM | POA: Diagnosis present

## 2017-04-28 DIAGNOSIS — D6959 Other secondary thrombocytopenia: Secondary | ICD-10-CM | POA: Diagnosis not present

## 2017-04-28 DIAGNOSIS — Z7982 Long term (current) use of aspirin: Secondary | ICD-10-CM | POA: Diagnosis not present

## 2017-04-28 DIAGNOSIS — Z66 Do not resuscitate: Secondary | ICD-10-CM | POA: Diagnosis present

## 2017-04-28 DIAGNOSIS — T451X5A Adverse effect of antineoplastic and immunosuppressive drugs, initial encounter: Secondary | ICD-10-CM

## 2017-04-28 DIAGNOSIS — Z923 Personal history of irradiation: Secondary | ICD-10-CM | POA: Diagnosis not present

## 2017-04-28 DIAGNOSIS — Z9221 Personal history of antineoplastic chemotherapy: Secondary | ICD-10-CM

## 2017-04-28 DIAGNOSIS — Z6823 Body mass index (BMI) 23.0-23.9, adult: Secondary | ICD-10-CM

## 2017-04-28 DIAGNOSIS — D6481 Anemia due to antineoplastic chemotherapy: Secondary | ICD-10-CM | POA: Diagnosis not present

## 2017-04-28 LAB — CBC WITH DIFFERENTIAL/PLATELET
BASOS ABS: 0 10*3/uL (ref 0–0.1)
Basophils Absolute: 0 10*3/uL (ref 0–0.1)
Basophils Relative: 3 %
Basophils Relative: 3 %
EOS ABS: 0 10*3/uL (ref 0–0.7)
EOS PCT: 1 %
Eosinophils Absolute: 0 10*3/uL (ref 0–0.7)
Eosinophils Relative: 3 %
HCT: 23.5 % — ABNORMAL LOW (ref 35.0–47.0)
HEMATOCRIT: 27.8 % — AB (ref 35.0–47.0)
HEMOGLOBIN: 9 g/dL — AB (ref 12.0–16.0)
Hemoglobin: 7.8 g/dL — ABNORMAL LOW (ref 12.0–16.0)
LYMPHS ABS: 0.1 10*3/uL — AB (ref 1.0–3.6)
LYMPHS PCT: 43 %
LYMPHS PCT: 37 %
Lymphs Abs: 0.1 10*3/uL — ABNORMAL LOW (ref 1.0–3.6)
MCH: 28 pg (ref 26.0–34.0)
MCH: 28.6 pg (ref 26.0–34.0)
MCHC: 32.4 g/dL (ref 32.0–36.0)
MCHC: 33.1 g/dL (ref 32.0–36.0)
MCV: 86.2 fL (ref 80.0–100.0)
MCV: 86.1 fL (ref 80.0–100.0)
Monocytes Absolute: 0.1 10*3/uL — ABNORMAL LOW (ref 0.2–0.9)
Monocytes Absolute: 0.1 10*3/uL — ABNORMAL LOW (ref 0.2–0.9)
Monocytes Relative: 43 %
Monocytes Relative: 50 %
NEUTROS ABS: 0 10*3/uL — AB (ref 1.4–6.5)
NEUTROS PCT: 7 %
Neutro Abs: 0 10*3/uL — ABNORMAL LOW (ref 1.4–6.5)
Neutrophils Relative %: 10 %
Platelets: 26 10*3/uL — CL (ref 150–440)
Platelets: 26 10*3/uL — CL (ref 150–440)
RBC: 3.23 MIL/uL — AB (ref 3.80–5.20)
RBC: 2.73 MIL/uL — AB (ref 3.80–5.20)
RDW: 16.2 % — ABNORMAL HIGH (ref 11.5–14.5)
RDW: 15.7 % — ABNORMAL HIGH (ref 11.5–14.5)
WBC: 0.3 10*3/uL — AB (ref 3.6–11.0)
WBC: 0.2 10*3/uL — AB (ref 3.6–11.0)

## 2017-04-28 LAB — COMPREHENSIVE METABOLIC PANEL
ALBUMIN: 2.8 g/dL — AB (ref 3.5–5.0)
ALBUMIN: 2.3 g/dL — AB (ref 3.5–5.0)
ALK PHOS: 100 U/L (ref 38–126)
ALK PHOS: 85 U/L (ref 38–126)
ALT: 13 U/L — AB (ref 14–54)
ALT: 12 U/L — ABNORMAL LOW (ref 14–54)
ANION GAP: 12 (ref 5–15)
ANION GAP: 10 (ref 5–15)
AST: 18 U/L (ref 15–41)
AST: 14 U/L — ABNORMAL LOW (ref 15–41)
BUN: 16 mg/dL (ref 6–20)
BUN: 14 mg/dL (ref 6–20)
CALCIUM: 8.4 mg/dL — AB (ref 8.9–10.3)
CO2: 29 mmol/L (ref 22–32)
CO2: 24 mmol/L (ref 22–32)
Calcium: 7 mg/dL — ABNORMAL LOW (ref 8.9–10.3)
Chloride: 95 mmol/L — ABNORMAL LOW (ref 101–111)
Chloride: 98 mmol/L — ABNORMAL LOW (ref 101–111)
Creatinine, Ser: 0.65 mg/dL (ref 0.44–1.00)
Creatinine, Ser: 0.65 mg/dL (ref 0.44–1.00)
GFR calc Af Amer: >60 mL/min (ref >60)
GFR calc Af Amer: >60 mL/min (ref >60)
GFR calc non Af Amer: >60 mL/min (ref >60)
GFR calc non Af Amer: >60 mL/min (ref >60)
GLUCOSE: 116 mg/dL — AB (ref 65–99)
GLUCOSE: 125 mg/dL — AB (ref 65–99)
POTASSIUM: 2.9 mmol/L — AB (ref 3.5–5.1)
Potassium: 3.6 mmol/L (ref 3.5–5.1)
SODIUM: 136 mmol/L (ref 135–145)
SODIUM: 132 mmol/L — AB (ref 135–145)
Total Bilirubin: 0.9 mg/dL (ref 0.3–1.2)
Total Bilirubin: 0.9 mg/dL (ref 0.3–1.2)
Total Protein: 5.9 g/dL — ABNORMAL LOW (ref 6.5–8.1)
Total Protein: 4.9 g/dL — ABNORMAL LOW (ref 6.5–8.1)

## 2017-04-28 LAB — GLUCOSE, CAPILLARY: Glucose-Capillary: 111 mg/dL — ABNORMAL HIGH (ref 65–99)

## 2017-04-28 LAB — LACTIC ACID, PLASMA
Lactic Acid, Venous: 3 mmol/L (ref 0.5–1.9)
Lactic Acid, Venous: 0.8 mmol/L (ref 0.5–1.9)

## 2017-04-28 LAB — INFLUENZA PANEL BY PCR (TYPE A & B)
INFLAPCR: NEGATIVE
INFLBPCR: NEGATIVE

## 2017-04-28 LAB — MRSA PCR SCREENING: MRSA BY PCR: NEGATIVE

## 2017-04-28 LAB — TROPONIN I: Troponin I: <0.03 ng/mL (ref <0.03)

## 2017-04-28 LAB — HEMOGLOBIN AND HEMATOCRIT, BLOOD
HCT: 21.1 % — ABNORMAL LOW (ref 35.0–47.0)
HEMOGLOBIN: 7 g/dL — AB (ref 12.0–16.0)

## 2017-04-28 LAB — PROTIME-INR
INR: 1
INR: 1.1
PROTHROMBIN TIME: 13.1 s (ref 11.4–15.2)
Prothrombin Time: 14.1 seconds (ref 11.4–15.2)

## 2017-04-28 LAB — LIPASE, BLOOD: Lipase: 15 U/L (ref 11–51)

## 2017-04-28 LAB — POTASSIUM: POTASSIUM: 3.7 mmol/L (ref 3.5–5.1)

## 2017-04-28 LAB — PROCALCITONIN
PROCALCITONIN: 0.1 ng/mL
Procalcitonin: 0.17 ng/mL

## 2017-04-28 LAB — PHOSPHORUS: PHOSPHORUS: 2.3 mg/dL — AB (ref 2.5–4.6)

## 2017-04-28 LAB — APTT: APTT: 31 s (ref 24–36)

## 2017-04-28 LAB — MAGNESIUM: MAGNESIUM: 0.9 mg/dL — AB (ref 1.7–2.4)

## 2017-04-28 MED ORDER — NYSTATIN 100000 UNIT/ML MT SUSP
5.0000 mL | Freq: Four times a day (QID) | OROMUCOSAL | Status: DC
  Administered 2017-04-28 – 2017-05-02 (×15): 500000 [IU] via OROMUCOSAL
  Filled 2017-04-28 (×18): qty 5

## 2017-04-28 MED ORDER — ORAL CARE MOUTH RINSE
15.0000 mL | Freq: Two times a day (BID) | OROMUCOSAL | Status: DC
  Administered 2017-04-28 – 2017-05-01 (×5): 15 mL via OROMUCOSAL

## 2017-04-28 MED ORDER — DEXTROSE 5 % IV SOLN
30.0000 mmol | Freq: Once | INTRAVENOUS | Status: AC
  Administered 2017-04-28: 30 mmol via INTRAVENOUS
  Filled 2017-04-28: qty 10

## 2017-04-28 MED ORDER — DEXTROSE 5 % IV SOLN
2.0000 g | Freq: Two times a day (BID) | INTRAVENOUS | Status: DC
  Filled 2017-04-28: qty 2

## 2017-04-28 MED ORDER — LORAZEPAM 0.5 MG PO TABS
0.5000 mg | ORAL_TABLET | Freq: Four times a day (QID) | ORAL | Status: DC | PRN
  Administered 2017-05-01: 0.5 mg via ORAL
  Filled 2017-04-28: qty 1

## 2017-04-28 MED ORDER — GUAIFENESIN 100 MG/5ML PO SYRP
200.0000 mg | ORAL_SOLUTION | Freq: Three times a day (TID) | ORAL | Status: DC | PRN
  Filled 2017-04-28: qty 10

## 2017-04-28 MED ORDER — SODIUM CHLORIDE 0.9 % IV BOLUS (SEPSIS)
500.0000 mL | Freq: Once | INTRAVENOUS | Status: AC
  Administered 2017-04-28: 500 mL via INTRAVENOUS

## 2017-04-28 MED ORDER — SODIUM CHLORIDE 0.9 % IV SOLN
Freq: Once | INTRAVENOUS | Status: AC
  Administered 2017-04-29: 02:00:00 via INTRAVENOUS

## 2017-04-28 MED ORDER — SUCRALFATE 1 G PO TABS
1.0000 g | ORAL_TABLET | Freq: Three times a day (TID) | ORAL | Status: DC
  Administered 2017-04-28 – 2017-05-02 (×16): 1 g via ORAL
  Filled 2017-04-28 (×16): qty 1

## 2017-04-28 MED ORDER — ONDANSETRON HCL 4 MG PO TABS: 4.0000 mg | ORAL_TABLET | Freq: Four times a day (QID) | ORAL | Status: DC | PRN

## 2017-04-28 MED ORDER — ASPIRIN EC 81 MG PO TBEC: 81.0000 mg | DELAYED_RELEASE_TABLET | Freq: Every day | ORAL | Status: DC

## 2017-04-28 MED ORDER — ACETAMINOPHEN 500 MG PO TABS
ORAL_TABLET | ORAL | Status: AC
  Filled 2017-04-28: qty 2

## 2017-04-28 MED ORDER — POTASSIUM CHLORIDE CRYS ER 20 MEQ PO TBCR: 40.0000 meq | EXTENDED_RELEASE_TABLET | Freq: Once | ORAL | Status: DC

## 2017-04-28 MED ORDER — LIDOCAINE VISCOUS 2 % MT SOLN: 15.0000 mL | Freq: Once | OROMUCOSAL | Status: DC

## 2017-04-28 MED ORDER — SODIUM CHLORIDE 0.9 % IV SOLN
INTRAVENOUS | Status: DC
  Administered 2017-04-28 – 2017-05-01 (×6): via INTRAVENOUS

## 2017-04-28 MED ORDER — NICOTINE 14 MG/24HR TD PT24
14.0000 mg | MEDICATED_PATCH | Freq: Every day | TRANSDERMAL | Status: DC
  Administered 2017-04-28 – 2017-05-02 (×5): 14 mg via TRANSDERMAL
  Filled 2017-04-28 (×5): qty 1

## 2017-04-28 MED ORDER — SODIUM CHLORIDE 0.9 % IV BOLUS (SEPSIS)
1000.0000 mL | Freq: Once | INTRAVENOUS | Status: AC
  Administered 2017-04-28: 1000 mL via INTRAVENOUS

## 2017-04-28 MED ORDER — GUAIFENESIN 100 MG/5ML PO SOLN
200.0000 mg | Freq: Three times a day (TID) | ORAL | Status: DC | PRN
  Administered 2017-04-28 – 2017-04-29 (×3): 200 mg via ORAL
  Administered 2017-05-01: 16:00:00 100 mg via ORAL
  Filled 2017-04-28 (×5): qty 10

## 2017-04-28 MED ORDER — MOMETASONE FURO-FORMOTEROL FUM 200-5 MCG/ACT IN AERO
2.0000 | INHALATION_SPRAY | Freq: Two times a day (BID) | RESPIRATORY_TRACT | Status: DC
  Administered 2017-04-28 – 2017-05-02 (×8): 2 via RESPIRATORY_TRACT
  Filled 2017-04-28: qty 8.8

## 2017-04-28 MED ORDER — DEXTROSE 5 % IV SOLN
1.0000 g | Freq: Once | INTRAVENOUS | Status: AC
  Administered 2017-04-28: 1 g via INTRAVENOUS
  Filled 2017-04-28: qty 1

## 2017-04-28 MED ORDER — LIDOCAINE HCL 2 % EX GEL
CUTANEOUS | Status: AC
  Administered 2017-04-28: 10
  Filled 2017-04-28: qty 10

## 2017-04-28 MED ORDER — LIDOCAINE HCL 2 % EX GEL: 1.0000 "application " | Freq: Once | CUTANEOUS | Status: DC

## 2017-04-28 MED ORDER — MAGNESIUM SULFATE 4 GM/100ML IV SOLN
4.0000 g | Freq: Once | INTRAVENOUS | Status: AC
  Administered 2017-04-28: 4 g via INTRAVENOUS
  Filled 2017-04-28: qty 100

## 2017-04-28 MED ORDER — ACETAMINOPHEN 500 MG PO TABS
1000.0000 mg | ORAL_TABLET | Freq: Once | ORAL | Status: AC
  Administered 2017-04-28: 1000 mg via ORAL
  Filled 2017-04-28: qty 2

## 2017-04-28 MED ORDER — ATORVASTATIN CALCIUM 20 MG PO TABS
10.0000 mg | ORAL_TABLET | Freq: Every day | ORAL | Status: DC
  Administered 2017-04-28 – 2017-05-01 (×4): 10 mg via ORAL
  Filled 2017-04-28 (×4): qty 1

## 2017-04-28 MED ORDER — POTASSIUM CHLORIDE 10 MEQ/100ML IV SOLN
10.0000 meq | INTRAVENOUS | Status: AC
  Administered 2017-04-28 (×2): 10 meq via INTRAVENOUS
  Filled 2017-04-28 (×2): qty 100

## 2017-04-28 MED ORDER — ALBUTEROL SULFATE (2.5 MG/3ML) 0.083% IN NEBU: 2.5000 mg | INHALATION_SOLUTION | Freq: Four times a day (QID) | RESPIRATORY_TRACT | Status: DC | PRN

## 2017-04-28 MED ORDER — NOREPINEPHRINE BITARTRATE 1 MG/ML IV SOLN
0.0000 ug/min | Freq: Once | INTRAVENOUS | Status: DC
  Filled 2017-04-28: qty 4

## 2017-04-28 MED ORDER — DOCUSATE SODIUM 100 MG PO CAPS
100.0000 mg | ORAL_CAPSULE | Freq: Two times a day (BID) | ORAL | Status: DC
  Administered 2017-04-28 – 2017-05-01 (×7): 100 mg via ORAL
  Filled 2017-04-28 (×10): qty 1

## 2017-04-28 MED ORDER — ALBUTEROL SULFATE HFA 108 (90 BASE) MCG/ACT IN AERS: 2.0000 | INHALATION_SPRAY | Freq: Four times a day (QID) | RESPIRATORY_TRACT | Status: DC | PRN

## 2017-04-28 MED ORDER — POTASSIUM CHLORIDE 20 MEQ PO PACK
40.0000 meq | PACK | Freq: Once | ORAL | Status: AC
  Administered 2017-04-28: 40 meq via ORAL
  Filled 2017-04-28: qty 2

## 2017-04-28 MED ORDER — TBO-FILGRASTIM 480 MCG/0.8ML ~~LOC~~ SOSY
480.0000 ug | PREFILLED_SYRINGE | Freq: Once | SUBCUTANEOUS | Status: AC
  Administered 2017-04-28: 480 ug via SUBCUTANEOUS
  Filled 2017-04-28: qty 0.8

## 2017-04-28 MED ORDER — IPRATROPIUM BROMIDE 0.02 % IN SOLN: 0.5000 mg | Freq: Four times a day (QID) | RESPIRATORY_TRACT | Status: DC

## 2017-04-28 MED ORDER — CEFEPIME HCL 2 G IJ SOLR
2.0000 g | Freq: Two times a day (BID) | INTRAMUSCULAR | Status: DC
  Filled 2017-04-28: qty 2

## 2017-04-28 MED ORDER — VANCOMYCIN HCL IN DEXTROSE 1-5 GM/200ML-% IV SOLN
1000.0000 mg | INTRAVENOUS | Status: DC
  Administered 2017-04-28 – 2017-04-29 (×2): 1000 mg via INTRAVENOUS
  Filled 2017-04-28 (×3): qty 200

## 2017-04-28 MED ORDER — PIPERACILLIN-TAZOBACTAM 3.375 G IVPB
3.3750 g | Freq: Three times a day (TID) | INTRAVENOUS | Status: DC
  Administered 2017-04-28 – 2017-05-02 (×12): 3.375 g via INTRAVENOUS
  Filled 2017-04-28 (×12): qty 50

## 2017-04-28 MED ORDER — VANCOMYCIN HCL IN DEXTROSE 1-5 GM/200ML-% IV SOLN
1000.0000 mg | Freq: Once | INTRAVENOUS | Status: AC
  Administered 2017-04-28: 1000 mg via INTRAVENOUS
  Filled 2017-04-28: qty 200

## 2017-04-28 MED ORDER — VANCOMYCIN HCL IN DEXTROSE 1-5 GM/200ML-% IV SOLN: 1000.0000 mg | Freq: Once | INTRAVENOUS | Status: DC

## 2017-04-28 NOTE — ED Notes (Signed)
Patient transported to X-ray 

## 2017-04-28 NOTE — ED Notes (Signed)
Unable to give report at this time. ICU RN to call back to receive.

## 2017-04-28 NOTE — Progress Notes (Signed)
ANTIBIOTIC CONSULT NOTE - INITIAL  Pharmacy Consult for vancomycin Indication: pneumonia  No Known Allergies  Patient Measurements:   Adjusted Body Weight:   Vital Signs:   Intake/Output from previous day: No intake/output data recorded. Intake/Output from this shift: No intake/output data recorded.  Labs: No results for input(s): WBC, HGB, PLT, LABCREA, CREATININE in the last 72 hours. Estimated Creatinine Clearance: 58.9 mL/min (by C-G formula based on SCr of 0.8 mg/dL). No results for input(s): VANCOTROUGH, VANCOPEAK, VANCORANDOM, GENTTROUGH, GENTPEAK, GENTRANDOM, TOBRATROUGH, TOBRAPEAK, TOBRARND, AMIKACINPEAK, AMIKACINTROU, AMIKACIN in the last 72 hours.   Microbiology: No results found for this or any previous visit (from the past 720 hour(s)).  Medical History: Past Medical History:  Diagnosis Date  . AAA (abdominal aortic aneurysm) ([place])   . Breast cancer ([place]) 2002   left  . COPD (chronic obstructive pulmonary disease) ([place])   . High cholesterol   . Personal history of chemotherapy   . Small cell lung cancer ([place])   . Tobacco abuse     Medications:  Infusions:  . ceFEPime (MAXIPIME) IV 1 g (04/28/17 1025)  . vancomycin 1,000 mg (04/28/17 1033)  . vancomycin     Assessment: 64 yof to ED via EMS for fatigue/syncope, noted orthostatic hypotension. PMH includes COPD, small cell lung cancer III last chemo 3 weeks ago. Recent OP abx Augmentin 2 weeks ago. Positive fever, tachycardia, hypotension - sepsis criteria with respiratory symptoms. Pharmacy consulted to dose vancomycin for PNA/sepsis.  Goal of Therapy:  Vancomycin trough level 15-20 mcg/ml  Plan:  Vancomycin 1 gm IV x 1 in ED followed in approximately 11 hours (stacked dosing) by vancomycin 1 gm IV Q18H, predicted trough 16 mcg/mL. Pharmacy will continue to follow and adjust as needed to maintain trough 15 to 20 mcg/mL.   Vd 39.6 L, Ke 0.053 hr-1, T1/2 13.1 hr  Laural Benes, Pharm.D.,  BCPS Clinical Pharmacist 04/28/2017,10:41 AM

## 2017-04-28 NOTE — ED Notes (Signed)
ED Provider at bedside. 

## 2017-04-28 NOTE — Progress Notes (Signed)
CODE SEPSIS - PHARMACY COMMUNICATION  **Broad Spectrum Antibiotics should be administered within 1 hour of Sepsis diagnosis**  Time Code Sepsis Called/Page Received: 10:15  Antibiotics Ordered: Cefepime and vancomycin  Time of 1st antibiotic administration: 10:25  Additional action taken by pharmacy: Spoke with RN Hunter at 10:20 to make sure he was able to remove abx from [facility]. He said he hadn't tried yet but has 2 lines, plans on running them at the same time, and will call if there are any issues removing drugs from Pyxis.  If necessary, Name of Provider/Nurse Contacted: Hunter, RN  Laural Benes ,PharmD, BCPS Clinical Pharmacist  04/28/2017  10:18 AM

## 2017-04-28 NOTE — ED Notes (Signed)
Transport delayed due to hypotension and central access needed. Port accessed per MD request.

## 2017-04-28 NOTE — ED Notes (Signed)
Port accessed

## 2017-04-28 NOTE — Progress Notes (Signed)
CRITICAL VALUE ALERT  Critical Value:  0.9 Mag.   Date & Time Notied:  12/1 @ 2030  Provider Notified: Ms. Patria Mane, NP @ 2031  Orders Received/Actions taken: NP ordered replacement

## 2017-04-28 NOTE — ED Provider Notes (Addendum)
[facility] Emergency Department Provider Note  ____________________________________________   First MD Initiated Contact with Patient 04/28/17 [id number]     (approximate)  I have reviewed the triage vital signs and the nursing notes.   HISTORY  Chief Complaint No chief complaint on file.   HPI Roberta [name] is a 71 y.o. female who presents to the emergency department via EMS with fatigue and near syncope.  When EMS arrived they found the patient at home tired appearing and orthostatic with normal blood pressure and heart rate when lying down although when standing up she was hypotensive tachycardic and near syncopal.  The patient has a complex past medical history including COPD as well as small cell lung cancer stage III.  Her last chemotherapy was 3 weeks ago.  2 weeks ago she had a cough and completed a course of Augmentin which she said initially did improve her symptoms.  She has not felt ill again until this morning.  She denies fevers or chills now.  She does have moderate severity sharp upper chest pain worse with cough improved when not coughing.  She mostly feels tired and lethargic.  Past Medical History:  Diagnosis Date  . AAA (abdominal aortic aneurysm) ([place])   . Breast cancer ([place]) 2002   left  . COPD (chronic obstructive pulmonary disease) ([place])   . High cholesterol   . Personal history of chemotherapy   . Small cell lung cancer ([place])   . Tobacco abuse     Patient Active Problem List   Diagnosis Date Noted  . Small cell lung cancer, right upper lobe ([place]) 02/23/2017  . Goals of care, counseling/discussion 02/23/2017  . Lung mass   . AAA (abdominal aortic aneurysm) without rupture ([place]) 11/02/2016  . COPD mixed type ([place]) 11/02/2016  . Hyperlipidemia 11/02/2016  . PAD (peripheral artery disease) ([place]) 11/02/2016    Past Surgical History:  Procedure Laterality Date  . ABDOMINAL HYSTERECTOMY    . ENDOBRONCHIAL ULTRASOUND N/A 02/19/2017    Procedure: ENDOBRONCHIAL ULTRASOUND;  Surgeon: Flora Lipps, MD;  Location: [facility] ORS;  Service: Cardiopulmonary;  Laterality: N/A;  . ENDOVASCULAR REPAIR/STENT GRAFT N/A 12/06/2016   Procedure: Endovascular Repair/Stent Graft;  Surgeon: Katha Cabal, MD;  Location: [place] CV LAB;  Service: Cardiovascular;  Laterality: N/A;  . IR FLUORO GUIDE PORT INSERTION RIGHT  03/02/2017  . MASTECTOMY Left 2002   with sentinel node     Prior to Admission medications   Medication Sig Start Date End Date Taking? Authorizing Provider  acetaminophen (TYLENOL) 500 MG tablet Take 1,000 mg by mouth every 6 (six) hours as needed (for pain.).    [provider]  albuterol (PROVENTIL HFA;VENTOLIN HFA) 108 (90 Base) MCG/ACT inhaler Inhale 2 puffs into the lungs every 6 (six) hours as needed for wheezing or shortness of breath. 03/23/16   Frederich Cha, MD  amoxicillin-clavulanate (AUGMENTIN) 875-125 MG tablet Take 1 tablet 2 (two) times daily by mouth. 04/16/17   Sindy Guadeloupe, MD  aspirin EC 81 MG tablet Take 81 mg by mouth daily.    [provider]  atorvastatin (LIPITOR) 10 MG tablet Take 10 mg by mouth at bedtime.  08/11/16   [provider]  dexamethasone (DECADRON) 4 MG tablet Take 2 tablets two times a day for 1 day on day 4 after cisplatin chemotherapy. Take with food. 02/23/17   Sindy Guadeloupe, MD  fluticasone-salmeterol (ADVAIR HFA) [phone] MCG/ACT inhaler Inhale 2 puffs into the lungs 2 (two)  times daily.    [provider]  guaifenesin (ROBITUSSIN) 100 MG/5ML syrup Take 200 mg 3 (three) times daily as needed by mouth for cough.    [provider]  ipratropium (ATROVENT) 0.02 % nebulizer solution Take 0.5 mg by nebulization 4 (four) times daily.    [provider]  lidocaine-prilocaine (EMLA) cream Apply to affected area once 02/23/17   Sindy Guadeloupe, MD  LORazepam (ATIVAN) 0.5 MG tablet Take 1 tablet (0.5 mg total) by mouth every 6 (six) hours  as needed (Nausea or vomiting). 02/23/17   Sindy Guadeloupe, MD  ondansetron (ZOFRAN) 8 MG tablet Take 1 tablet (8 mg total) by mouth 2 (two) times daily as needed. Start on the third day after cisplatin chemotherapy. 02/23/17   Sindy Guadeloupe, MD  oxyCODONE (OXY IR/ROXICODONE) 5 MG immediate release tablet Take 1 tablet (5 mg total) by mouth every 6 (six) hours as needed for severe pain. 03/13/17   Sindy Guadeloupe, MD  prochlorperazine (COMPAZINE) 10 MG tablet Take 1 tablet (10 mg total) by mouth every 6 (six) hours as needed (Nausea or vomiting). 02/23/17   Sindy Guadeloupe, MD  sucralfate (CARAFATE) 1 g tablet Take 1 tablet (1 g total) 4 (four) times daily -  with meals and at bedtime by mouth. dissolve in 8 oz of water 04/03/17   Noreene Filbert, MD    Allergies Patient has no known allergies.  Family History  Problem Relation Age of Onset  . Breast cancer Neg Hx     Social History Social History   Tobacco Use  . Smoking status: Current Every Day Smoker    Packs/day: 0.50    Types: Cigarettes  . Smokeless tobacco: Never Used  Substance Use Topics  . Alcohol use: No  . Drug use: No    Review of Systems Constitutional: No fever/chills Eyes: No visual changes. ENT: No sore throat. Cardiovascular: Positive for chest pain. Respiratory: Positive for shortness of breath. Gastrointestinal: No abdominal pain.  No nausea, no vomiting.  No diarrhea.  No constipation. Genitourinary: Negative for dysuria. Musculoskeletal: Negative for back pain. Skin: Negative for rash. Neurological: Negative for headaches, focal weakness or numbness.   ____________________________________________   PHYSICAL EXAM:  VITAL SIGNS: ED Triage Vitals  Enc Vitals Group     BP      Pulse      Resp      Temp      Temp src      SpO2      Weight      Height      Head Circumference      Peak Flow      Pain Score      Pain Loc      Pain Edu?      Excl. in [place]?     Constitutional: Alert and oriented  x4 chronically ill-appearing no diaphoresis speaks in short sentences Eyes: PERRL EOMI. Head: Atraumatic. Nose: No congestion/rhinnorhea. Mouth/Throat: No trismus Neck: No stridor.   Cardiovascular: Tachycardic rate, regular rhythm. Grossly normal heart sounds.  Good peripheral circulation. Respiratory: Increased respiratory effort with mild accessory muscle use.  Lung sounds somewhat coarse on the right although moving good air Gastrointestinal: Soft nontender Musculoskeletal: No lower extremity edema   Neurologic:  Normal speech and language. No gross focal neurologic deficits are appreciated. Skin:  Skin is warm, dry and intact. No rash noted. Psychiatric: Mood and affect are normal. Speech and behavior are normal.    ____________________________________________  DIFFERENTIAL includes but not limited to  Pneumonia, influenza, pneumothorax, pulmonary embolism, sepsis, urinary tract infection, bacteremia ____________________________________________   LABS (all labs ordered are listed, but only abnormal results are displayed)  Labs Reviewed  LACTIC ACID, PLASMA - Abnormal; Notable for the following components:      Result Value   Lactic Acid, Venous 3.0 (*)    All other components within normal limits  COMPREHENSIVE METABOLIC PANEL - Abnormal; Notable for the following components:   Chloride 95 (*)    Glucose, Bld 116 (*)    Calcium 8.4 (*)    Total Protein 5.9 (*)    Albumin 2.8 (*)    ALT 13 (*)    All other components within normal limits  CULTURE, BLOOD (ROUTINE X 2)  CULTURE, BLOOD (ROUTINE X 2)  URINE CULTURE  LIPASE, BLOOD  TROPONIN I  PROTIME-INR  LACTIC ACID, PLASMA  CBC WITH DIFFERENTIAL/PLATELET  PROCALCITONIN  URINALYSIS, ROUTINE W REFLEX MICROSCOPIC  INFLUENZA PANEL BY PCR (TYPE A & B)    Blood work reviewed by me with elevated lactic acid concerning for sepsis Low albumin is concerning for chronic nutritional insufficiency  __________________________________________  EKG  ED ECG REPORT I, Darel Hong, the attending physician, personally viewed and interpreted this ECG.  Date: 04/28/2017 EKG Time:  Rate: 124 Rhythm: Sinus tachycardia QRS Axis: normal Intervals: normal ST/T Wave abnormalities: normal Narrative Interpretation: Very wavy baseline makes interpretation difficult but no acute ischemia noted.  Early transition.  ____________________________________________  RADIOLOGY  Chest x-ray reviewed by me with no acute disease ____________________________________________   PROCEDURES  Procedure(s) performed: no  Procedures  Critical Care performed: Yes  CRITICAL CARE Performed by: Darel Hong   Total critical care time: 35 minutes  Critical care time was exclusive of separately billable procedures and treating other patients.  Critical care was necessary to treat or prevent imminent or life-threatening deterioration.  Critical care was time spent personally by me on the following activities: development of treatment plan with patient and/or surrogate as well as nursing, discussions with consultants, evaluation of patient's response to treatment, examination of patient, obtaining history from patient or surrogate, ordering and performing treatments and interventions, ordering and review of laboratory studies, ordering and review of radiographic studies, pulse oximetry and re-evaluation of patient's condition.   Observation: no ____________________________________________   INITIAL IMPRESSION / ASSESSMENT AND PLAN / ED COURSE  Pertinent labs & imaging results that were available during my care of the patient were reviewed by me and considered in my medical decision making (see chart for details).  On arrival the patient is febrile, tachycardic, and hypotensive in the setting of cough and shortness of breath as well as hypoxia.  She had recent chemotherapy and also recently took  antibiotics which places her at high risk for pseudomonal infection.  At this point she is septic and requires intravenous fluids, antipyretics, and broad-spectrum IV antibiotics.  Influenza testing is pending.  She will require inpatient admission.     ----------------------------------------- 10:48 AM on 04/28/2017 -----------------------------------------  Unclear etiology of the patient's sepsis, however she remains tachycardic with an elevated lactic acid.  At this point she requires inpatient admission for aggressive IV fluids, broad-spectrum antibiotics, and continued supportive care.  I discussed with the patient and family who verbalized understanding and agreement with admission.  I then discussed the case with the hospitalist who is graciously agreed to admit the patient to her service. ____________________________________________  ----------------------------------------- 11:43 AM on 04/28/2017 -----------------------------------------  The patient's blood pressure  is now in the 80s despite adequate fluid resuscitation.  We will begin levophed and notify the hospitalist.  Sepsis - Repeat Assessment  Performed at:    1209  Vitals     Blood pressure (!) 86/50, pulse (!) 109, temperature (!) 100.5 F (38.1 C), temperature source Oral, resp. rate (!) 29, height 5\' 3"  (1.6 m), weight 59 kg (130 lb), SpO2 93 %.  Heart:     Tachycardic  Lungs:    CTA  Capillary Refill:   <2 sec  Peripheral Pulse:   Radial pulse palpable  Skin:     Normal Color     FINAL CLINICAL IMPRESSION(S) / ED DIAGNOSES  Final diagnoses:  Sepsis, due to unspecified organism ([place])  Severe sepsis ([place])      NEW MEDICATIONS STARTED DURING THIS VISIT:  This SmartLink is deprecated. Use AVSMEDLIST instead to display the medication list for a patient.   Note:  This document was prepared using Dragon voice recognition software and may include unintentional dictation errors.     Darel Hong, MD 04/28/17 1143    Darel Hong, MD 04/28/17 1209

## 2017-04-28 NOTE — ED Triage Notes (Signed)
Pt presents from home via EMS c/o weakness. Fever 100.5 upon arrival, 02 saturation 85% ra. MD at bedside.

## 2017-04-28 NOTE — H&P (Signed)
[place] [facility] [place] NAME: Roberta [name]    MR#:  [id number]  DATE OF BIRTH:  08/24/1945  DATE OF ADMISSION:  04/28/2017  PRIMARY CARE PHYSICIAN: Gayland Curry, MD   REQUESTING/REFERRING PHYSICIAN:  CHIEF COMPLAINT:   DIZZY HISTORY OF PRESENT ILLNESS:  Roberta [name]  is a 71 y.o. female with a known history of stage III small cell lung cancer, recent chemotherapy and radiation therapy, chronic history of COPD, hyperlipidemia, AAA status post stent, still continues to smoke, recently used Augmentin a few days ago for possible pneumonia is presenting to ED with a chief complaint of almost passing out spell and feeling weak and terrible. Patient was found to be hypotensive tachycardic and lactic acid 3. Chest x-ray with no acute infiltrate WBC count at 0.3. Platelet count is at 26,000. Patient is started on broad-spectrum IV antibiotics and hospitalist team is called to admit the patient. Son and daughter-in-law are at bedside. Patient was still hypotensive after giving fluid boluses and map is at 54  PAST MEDICAL HISTORY:   Past Medical History:  Diagnosis Date  . AAA (abdominal aortic aneurysm) ([place])   . Breast cancer ([place]) 2002   left  . COPD (chronic obstructive pulmonary disease) ([place])   . High cholesterol   . Personal history of chemotherapy   . Small cell lung cancer ([place])   . Tobacco abuse     PAST SURGICAL HISTOIRY:   Past Surgical History:  Procedure Laterality Date  . ABDOMINAL HYSTERECTOMY    . ENDOBRONCHIAL ULTRASOUND N/A 02/19/2017   Procedure: ENDOBRONCHIAL ULTRASOUND;  Surgeon: Flora Lipps, MD;  Location: [facility] ORS;  Service: Cardiopulmonary;  Laterality: N/A;  . ENDOVASCULAR REPAIR/STENT GRAFT N/A 12/06/2016   Procedure: Endovascular Repair/Stent Graft;  Surgeon: Katha Cabal, MD;  Location: [place] CV LAB;  Service: Cardiovascular;  Laterality: N/A;  . IR FLUORO GUIDE PORT INSERTION RIGHT   03/02/2017  . MASTECTOMY Left 2002   with sentinel node     SOCIAL HISTORY:   Social History   Tobacco Use  . Smoking status: Current Every Day Smoker    Packs/day: 0.50    Types: Cigarettes  . Smokeless tobacco: Never Used  Substance Use Topics  . Alcohol use: No    FAMILY HISTORY:   Family History  Problem Relation Age of Onset  . Breast cancer Neg Hx     DRUG ALLERGIES:  No Known Allergies  REVIEW OF SYSTEMS:  CONSTITUTIONAL: pt has  fever, fatigue and  weakness.  EYES: No blurred or double vision.  EARS, NOSE, AND THROAT: No tinnitus or ear pain.  RESPIRATORY:some cough,  shortness of breath, no wheezing or hemoptysis.  CARDIOVASCULAR: No chest pain, orthopnea, edema.  GASTROINTESTINAL: No nausea, vomiting, diarrhea or abdominal pain.  GENITOURINARY: No dysuria, hematuria.  ENDOCRINE: No polyuria, nocturia,  HEMATOLOGY: No anemia, easy bruising or bleeding SKIN: No rash or lesion. MUSCULOSKELETAL: No joint pain or arthritis.   NEUROLOGIC: No tingling, numbness, weakness.  PSYCHIATRY: No anxiety or depression.   MEDICATIONS AT HOME:   Prior to Admission medications   Medication Sig Start Date End Date Taking? Authorizing Provider  albuterol (PROVENTIL HFA;VENTOLIN HFA) 108 (90 Base) MCG/ACT inhaler Inhale 2 puffs into the lungs every 6 (six) hours as needed for wheezing or shortness of breath. 03/23/16  Yes Frederich Cha, MD  aspirin EC 81 MG tablet Take 81 mg by mouth daily.   Yes [provider]  atorvastatin (LIPITOR) 10 MG  tablet Take 10 mg by mouth at bedtime.  08/11/16  Yes [provider]  dexamethasone (DECADRON) 4 MG tablet Take 2 tablets two times a day for 1 day on day 4 after cisplatin chemotherapy. Take with food. 02/23/17  Yes Sindy Guadeloupe, MD  fluticasone-salmeterol (ADVAIR HFA) [phone] MCG/ACT inhaler Inhale 2 puffs into the lungs 2 (two) times daily.   Yes [provider]  guaifenesin (ROBITUSSIN) 100 MG/5ML syrup Take  200 mg 3 (three) times daily as needed by mouth for cough.   Yes [provider]  ipratropium (ATROVENT) 0.02 % nebulizer solution Take 0.5 mg by nebulization 4 (four) times daily.   Yes [provider]  lidocaine-prilocaine (EMLA) cream Apply to affected area once 02/23/17  Yes Sindy Guadeloupe, MD  LORazepam (ATIVAN) 0.5 MG tablet Take 1 tablet (0.5 mg total) by mouth every 6 (six) hours as needed (Nausea or vomiting). 02/23/17  Yes Sindy Guadeloupe, MD  nystatin (MYCOSTATIN) 100000 UNIT/ML suspension Use as directed 5 mLs in the mouth or throat 4 (four) times daily. Swish and swallow 04/24/17  Yes [provider]  ondansetron (ZOFRAN) 8 MG tablet Take 1 tablet (8 mg total) by mouth 2 (two) times daily as needed. Start on the third day after cisplatin chemotherapy. 02/23/17  Yes Sindy Guadeloupe, MD  sucralfate (CARAFATE) 1 g tablet Take 1 tablet (1 g total) 4 (four) times daily -  with meals and at bedtime by mouth. dissolve in 8 oz of water 04/03/17  Yes Chrystal, Eulas Post, MD  amoxicillin-clavulanate (AUGMENTIN) 875-125 MG tablet Take 1 tablet 2 (two) times daily by mouth. Patient not taking: Reported on 04/28/2017 04/16/17   Sindy Guadeloupe, MD  oxyCODONE (OXY IR/ROXICODONE) 5 MG immediate release tablet Take 1 tablet (5 mg total) by mouth every 6 (six) hours as needed for severe pain. Patient not taking: Reported on 04/28/2017 03/13/17   Sindy Guadeloupe, MD  prochlorperazine (COMPAZINE) 10 MG tablet Take 1 tablet (10 mg total) by mouth every 6 (six) hours as needed (Nausea or vomiting). Patient not taking: Reported on 04/28/2017 02/23/17   Sindy Guadeloupe, MD      VITAL SIGNS:  Blood pressure (!) 86/50, pulse (!) 109, temperature (!) 100.5 F (38.1 C), temperature source Oral, resp. rate (!) 29, height '5\' 3"'$  (1.6 m), weight 59 kg (130 lb), SpO2 93 %.  PHYSICAL EXAMINATION:  GENERAL:  71 y.o.-year-old patient lying in the bed with no acute distress.  EYES: Pupils equal, round,  reactive to light and accommodation. No scleral icterus. Extraocular muscles intact.  HEENT: Head atraumatic, normocephalic. Oropharynx and nasopharynx clear.  NECK:  Supple, no jugular venous distention. No thyroid enlargement, no tenderness.  LUNGS: Mod breath sounds bilaterally, no wheezing, rales,rhonchi or crepitation. No use of accessory muscles of respiration. Right anterior chest wall with Port-A-Cath CARDIOVASCULAR: S1, S2 normal. No murmurs, rubs, or gallops.  ABDOMEN: Soft, nontender, nondistended. Bowel sounds present. No organomegaly or mass.  EXTREMITIES: No pedal edema, cyanosis, or clubbing.  NEUROLOGIC: Cranial nerves II through XII are intact. Muscle strength at baseline in all extremities. Sensation intact. Gait not checked.  PSYCHIATRIC: The patient is alert and oriented x 3.  SKIN: No obvious rash, lesion, or ulcer.   LABORATORY PANEL:   CBC Recent Labs  Lab 04/28/17 1002  WBC 0.3*  HGB 9.0*  HCT 27.8*  PLT 26*   ------------------------------------------------------------------------------------------------------------------  Chemistries  Recent Labs  Lab 04/28/17 1002  NA 136  K 3.6  CL 95*  CO2 29  GLUCOSE 116*  BUN 16  CREATININE 0.65  CALCIUM 8.4*  AST 18  ALT 13*  ALKPHOS 100  BILITOT 0.9   ------------------------------------------------------------------------------------------------------------------  Cardiac Enzymes Recent Labs  Lab 04/28/17 1002  TROPONINI <0.03   ------------------------------------------------------------------------------------------------------------------  RADIOLOGY:  Dg Chest Port 1 View  Result Date: 04/28/2017 CLINICAL DATA:  Known right upper lobe small cell lung cancer, fever EXAM: PORTABLE CHEST 1 VIEW COMPARISON:  04/06/2017 FINDINGS: Left IJ power port catheter tip SVC RA junction. Ill-defined right hilar mass noted but better demonstrated by CT comparison. Biapical pleural-parenchymal scarring and  nodularity appears grossly stable. Mild background stable interstitial prominence. No superimposed pneumonia, significant collapse or consolidation. Negative for CHF or edema. No large effusion or pneumothorax. Trachea is midline. Aorta is atherosclerotic. Bones are osteopenic. IMPRESSION: Stable right upper lobe hilar mass and chronic changes as above. No definite superimposed acute process or edema. No large effusion or pneumothorax. Electronically Signed   By: Jerilynn Mages.  Shick M.D.   On: 04/28/2017 10:34    EKG:   Orders placed or performed during the hospital encounter of 04/28/17  . ED EKG 12-Lead  . ED EKG 12-Lead    IMPRESSION AND PLAN:   Roberta [name]  is a 71 y.o. female with a known history of stage III small cell lung cancer, recent chemotherapy and radiation therapy, chronic history of COPD, hyperlipidemia, AAA status post stent, still continues to smoke, recently used Augmentin a few days ago for possible pneumonia is presenting to ED with a chief complaint of almost passing out spell and feeling weak and terrible. Patient was found to be hypotensive tachycardic and lactic acid 3.  # Sepsis with severe hypotension Met septic criteria with hypotension, tachycardia. Elevated lactic acid Admitted to stepdown unit IV fluid boluses and pressors Broad-spectrum IV antibiotics cefepime and vancomycin Get blood cultures, urine cultures and sputum culture and sensitivity Chest x-ray negative Discussed with intensivist  #Neutropenic fever with recent history of chemotherapy Will give Neupogen and monitor white blood cell count Neutropenic precautions  #Stage III small cell lung cancer status post chemotherapy and radiation therapy oncology consult is placed, discussed with Dr. Janese Banks, agrees with Neupogen Dr. Mike Gip is on call for  the weekend  #Thrombocytopenia No active bleeding or bruising Will consider platelet transfusion if needed/if the platelet count is close to 10,000  #Chronic  history of COPD he still continues to smoke Bronchodilator therapy  #Tobacco abuse disorder Consult patient to quit smoking for 4 minutes. Agreeable with nicotine patch  All the records are reviewed and case discussed with ED provider. Management plans discussed with the patient, family and they are in agreement.  CODE STATUS: DNR Pandora Leiter HCPOA  TOTAL CRITICAL CARE TIME TAKING CARE OF THIS PATIENT: 45  minutes.   Note: This dictation was prepared with Dragon dictation along with smaller phrase technology. Any transcriptional errors that result from this process are unintentional.  Nicholes Mango M.D on 04/28/2017 at 12:40 PM  Between 7am to 6pm - Pager - [phone]  After 6pm go to [email or web address] - password [email or web address] [facility]  [facility]  Office  [phone]  CC: Primary care physician; Gayland Curry, MD

## 2017-04-28 NOTE — Progress Notes (Signed)
Alerted Ms. Patria Mane, NP that pt.'s HR was ST sustaining in the 130's-140's.   1 L NS bolus, 12 lead and additional lab work ordered. Pt. Asymptomatic, BP stable, will continue to monitor pt. Closely.

## 2017-04-28 NOTE — Progress Notes (Signed)
Alerted Ms. Tukov, NP about pt.'s drop in Hgb from 9.0 @ 1000 to 7.0 @ 2200. NP stated she will order unit of RBC's, will continue to monitor pt. Closely.

## 2017-04-28 NOTE — Consult Note (Addendum)
Pharmacy Antibiotic Note  Roberta [name] is a 71 y.o. female admitted on 04/28/2017 with neutropenic fever, sepsis.  Pharmacy has been consulted for vancomycin and cefepime dosing. Pt has PMH of stage II small cell lung cancer currenlty receiving chemotherapy/radiation.  Plan: Patient received 1g of vancomycin in the ED. Will give next dose in 8 hours Vancomycin 1000mg  IV every 18 hours.  Goal trough 15-20 mcg/mL.  Trough prior to the 5th total dose Zosyn 3.375g q 8hr EI infusion  Height: 5\' 3"  (160 cm) Weight: 130 lb (59 kg) IBW/kg (Calculated) : 52.4  Temp (24hrs), Avg:100.5 F (38.1 C), Min:100.5 F (38.1 C), Max:100.5 F (38.1 C)  Recent Labs  Lab 04/23/17 0832 04/23/17 0840 04/28/17 1002  WBC 3.0*  --  0.3*  CREATININE  --  0.80 0.65  LATICACIDVEN  --   --  3.0*    Estimated Creatinine Clearance: 54.1 mL/min (by C-G formula based on SCr of 0.65 mg/dL).    No Known Allergies  Antimicrobials this admission: cefepime 12/1 >> one dose vancomycin 12/1 >>  Zosyn 12/1>>  Dose adjustments this admission:   Microbiology results: 12/1 BCx:   Thank you for allowing pharmacy to be a part of this patient's care.  Ramond Dial, Pharm.D, BCPS Clinical Pharmacist  04/28/2017 2:09 PM

## 2017-04-28 NOTE — Consult Note (Signed)
[facility] Consultation      Name: Roberta [name] MRN: [id number] DOB: [birth year]    ADMISSION DATE:  04/28/2017 CONSULTATION DATE:  04/28/17  REFERRING MD :  Dr. Margaretmary Eddy   CHIEF COMPLAINT:   Weakness  HISTORY OF PRESENT ILLNESS   71 year old lady with past medical history significant for COPD, stage III lung cancer, ongoing tobacco abuse, dyslipidemia, breast carcinoma who presented to the hospital with one-week history of weakness and just not feeling well. The patient was found to be neutropenic and hypotensive at the time of presentation.  She received 2 L of IV fluids in the emergency room and was admitted to the ICU for closer monitoring and further care.  I saw the patient on arrival to the ICU. She endorses a 1 week history of overall lethargy and not feeling well.  She states this morning she got out of bed and felt dizzy and came to the emergency room.  She denies cough, sick contacts, abdominal pain, nausea, vomiting, shortness of breath, chest pain, chest pressure, headache, changes in vision. She is currently on chemotherapy and radiation therapy and states her last chemotherapy was 1 week ago.  She takes Decadron for 1 day on day 4 of her chemotherapy.  She is not on scheduled steroids. The patient was hemodynamically stable with systolic blood pressure of 104 and mean arterial pressure of 60 on arrival to the ICU.  She is awake.  She states she feels somewhat better compared to the time of her presentation in the ER.  Chest x-ray did not show any acute infiltrates. The patient's lab workup showed neutropenia with a white count of 0.3.  She has chronic anemia with a hemoglobin of 9.  She also has thrombocytopenia with platelet count of 26,000. Electrolytes were normal. Lactic acid levels were 3. Troponin was normal. Influenza screen was negative.  In the emergency room the patient was given vancomycin and cefepime.  She was also given 2 L of IV  fluids.  Blood cultures were sent in the emergency room.    PAST MEDICAL HISTORY    :  Past Medical History:  Diagnosis Date  . AAA (abdominal aortic aneurysm) ([place])   . Breast cancer ([place]) 2002   left  . COPD (chronic obstructive pulmonary disease) ([place])   . High cholesterol   . Personal history of chemotherapy   . Small cell lung cancer ([place])   . Tobacco abuse    Past Surgical History:  Procedure Laterality Date  . ABDOMINAL HYSTERECTOMY    . ENDOBRONCHIAL ULTRASOUND N/A 02/19/2017   Procedure: ENDOBRONCHIAL ULTRASOUND;  Surgeon: Flora Lipps, MD;  Location: [facility] ORS;  Service: Cardiopulmonary;  Laterality: N/A;  . ENDOVASCULAR REPAIR/STENT GRAFT N/A 12/06/2016   Procedure: Endovascular Repair/Stent Graft;  Surgeon: Katha Cabal, MD;  Location: [facility];  Service: Cardiovascular;  Laterality: N/A;  . IR FLUORO GUIDE PORT INSERTION RIGHT  03/02/2017  . MASTECTOMY Left 2002   with sentinel node    Prior to Admission medications   Medication Sig Start Date End Date Taking? Authorizing Provider  albuterol (PROVENTIL HFA;VENTOLIN HFA) 108 (90 Base) MCG/ACT inhaler Inhale 2 puffs into the lungs every 6 (six) hours as needed for wheezing or shortness of breath. 03/23/16  Yes Frederich Cha, MD  aspirin EC 81 MG tablet Take 81 mg by mouth daily.   Yes [provider]  atorvastatin (LIPITOR) 10 MG tablet Take 10 mg by mouth at bedtime.  08/11/16  Yes  [provider]  dexamethasone (DECADRON) 4 MG tablet Take 2 tablets two times a day for 1 day on day 4 after cisplatin chemotherapy. Take with food. 02/23/17  Yes Sindy Guadeloupe, MD  fluticasone-salmeterol (ADVAIR HFA) [phone] MCG/ACT inhaler Inhale 2 puffs into the lungs 2 (two) times daily.   Yes [provider]  guaifenesin (ROBITUSSIN) 100 MG/5ML syrup Take 200 mg 3 (three) times daily as needed by mouth for cough.   Yes [provider]  ipratropium (ATROVENT) 0.02 % nebulizer solution Take  0.5 mg by nebulization 4 (four) times daily.   Yes [provider]  lidocaine-prilocaine (EMLA) cream Apply to affected area once 02/23/17  Yes Sindy Guadeloupe, MD  LORazepam (ATIVAN) 0.5 MG tablet Take 1 tablet (0.5 mg total) by mouth every 6 (six) hours as needed (Nausea or vomiting). 02/23/17  Yes Sindy Guadeloupe, MD  nystatin (MYCOSTATIN) 100000 UNIT/ML suspension Use as directed 5 mLs in the mouth or throat 4 (four) times daily. Swish and swallow 04/24/17  Yes [provider]  ondansetron (ZOFRAN) 8 MG tablet Take 1 tablet (8 mg total) by mouth 2 (two) times daily as needed. Start on the third day after cisplatin chemotherapy. 02/23/17  Yes Sindy Guadeloupe, MD  sucralfate (CARAFATE) 1 g tablet Take 1 tablet (1 g total) 4 (four) times daily -  with meals and at bedtime by mouth. dissolve in 8 oz of water 04/03/17  Yes Chrystal, Eulas Post, MD  amoxicillin-clavulanate (AUGMENTIN) 875-125 MG tablet Take 1 tablet 2 (two) times daily by mouth. Patient not taking: Reported on 04/28/2017 04/16/17   Sindy Guadeloupe, MD  oxyCODONE (OXY IR/ROXICODONE) 5 MG immediate release tablet Take 1 tablet (5 mg total) by mouth every 6 (six) hours as needed for severe pain. Patient not taking: Reported on 04/28/2017 03/13/17   Sindy Guadeloupe, MD  prochlorperazine (COMPAZINE) 10 MG tablet Take 1 tablet (10 mg total) by mouth every 6 (six) hours as needed (Nausea or vomiting). Patient not taking: Reported on 04/28/2017 02/23/17   Sindy Guadeloupe, MD   No Known Allergies   FAMILY HISTORY   Family History  Problem Relation Age of Onset  . Breast cancer Neg Hx       SOCIAL HISTORY    reports that she has been smoking cigarettes.  She has been smoking about 0.50 packs per day. she has never used smokeless tobacco. She reports that she does not drink alcohol or use drugs.  ROS Multisystem review of system was performed and was negative except for that mentioned in HPI   VITAL SIGNS    Temp:  [100.5 F  (38.1 C)] 100.5 F (38.1 C) (12/01 0940) Pulse Rate:  [98-129] 102 (12/01 1300) Resp:  [19-29] 25 (12/01 1300) BP: (81-123)/(44-65) 101/55 (12/01 1300) SpO2:  [85 %-99 %] 95 % (12/01 1300) Weight:  [130 lb (59 kg)] 130 lb (59 kg) (12/01 1102) HEMODYNAMICS:   VENTILATOR SETTINGS:   INTAKE / OUTPUT: No intake or output data in the 24 hours ending 04/28/17 1418     PHYSICAL EXAM   Awake, alert, oriented.  No apparent distress. Sclera anicteric. Oral mucosa moist. CVS S1, S2, 0. Chest is clear to auscultation bilaterally with no rales rhonchi or wheezes. Abdomen is soft, nontender, nondistended. No lower extremity edema bilaterally   LABS   LABS:  CBC Recent Labs  Lab 04/23/17 0832 04/28/17 1002  WBC 3.0* 0.3*  HGB 10.8* 9.0*  HCT 32.6* 27.8*  PLT  163 26*   Coag's Recent Labs  Lab 04/28/17 1002  INR 1.00   BMET Recent Labs  Lab 04/23/17 0840 04/28/17 1002  NA 132* 136  K 3.1* 3.6  CL 93* 95*  CO2 29 29  BUN 21* 16  CREATININE 0.80 0.65  GLUCOSE 127* 116*   Electrolytes Recent Labs  Lab 04/23/17 0840 04/28/17 1002  CALCIUM 7.9* 8.4*   Sepsis Markers Recent Labs  Lab 04/28/17 1002  LATICACIDVEN 3.0*  PROCALCITON 0.10   ABG No results for input(s): PHART, PCO2ART, PO2ART in the last 168 hours. Liver Enzymes Recent Labs  Lab 04/28/17 1002  AST 18  ALT 13*  ALKPHOS 100  BILITOT 0.9  ALBUMIN 2.8*   Cardiac Enzymes Recent Labs  Lab 04/28/17 1002  TROPONINI <0.03   Glucose No results for input(s): GLUCAP in the last 168 hours.   No results found for this or any previous visit (from the past 240 hour(s)).   Current Facility-Administered Medications:  .  0.9 %  sodium chloride infusion, , Intravenous, Continuous, Gouru, Aruna, MD .  acetaminophen (TYLENOL) 500 MG tablet, , , ,  .  albuterol (PROVENTIL) (2.5 MG/3ML) 0.083% nebulizer solution 2.5 mg, 2.5 mg, Nebulization, Q6H PRN, Gouru, Aruna, MD .  atorvastatin (LIPITOR)  tablet 10 mg, 10 mg, Oral, QHS, Gouru, Aruna, MD .  docusate sodium (COLACE) capsule 100 mg, 100 mg, Oral, BID, Gouru, Aruna, MD .  lidocaine (XYLOCAINE) 2 % jelly 1 application, 1 application, Other, Once, Rifenbark, Milta Deiters, MD .  LORazepam (ATIVAN) tablet 0.5 mg, 0.5 mg, Oral, Q6H PRN, Gouru, Aruna, MD .  mometasone-formoterol (DULERA) 200-5 MCG/ACT inhaler 2 puff, 2 puff, Inhalation, BID, Gouru, Aruna, MD .  nicotine (NICODERM CQ - dosed in mg/24 hours) patch 14 mg, 14 mg, Transdermal, Daily, Gouru, Aruna, MD .  norepinephrine (LEVOPHED) 4 mg in dextrose 5 % 250 mL (0.016 mg/mL) infusion, 0-40 mcg/min, Intravenous, Once, Rifenbark, Milta Deiters, MD .  nystatin (MYCOSTATIN) 100000 UNIT/ML suspension 500,000 Units, 5 mL, Mouth/Throat, QID, Gouru, Aruna, MD .  ondansetron (ZOFRAN) tablet 4 mg, 4 mg, Oral, Q6H PRN, Gouru, Aruna, MD .  sodium chloride 0.9 % bolus 500 mL, 500 mL, Intravenous, Once, Gouru, Aruna, MD .  sucralfate (CARAFATE) tablet 1 g, 1 g, Oral, TID WC & HS, Gouru, Aruna, MD .  Tbo-Filgrastim (GRANIX) injection 480 mcg, 480 mcg, Subcutaneous, Once, Gouru, Aruna, MD .  vancomycin (VANCOCIN) IVPB 1000 mg/200 mL premix, 1,000 mg, Intravenous, Q18H, Darel Hong, MD  Facility-Administered Medications Ordered in Other Encounters:  .  sodium chloride flush (NS) 0.9 % injection 10 mL, 10 mL, Intravenous, PRN, Sindy Guadeloupe, MD, 10 mL at 03/26/17 0948 .  sodium chloride flush (NS) 0.9 % injection 10 mL, 10 mL, Intravenous, PRN, Sindy Guadeloupe, MD, 10 mL at 04/16/17 1006  IMAGING    Dg Chest Port 1 View  Result Date: 04/28/2017 CLINICAL DATA:  Known right upper lobe small cell lung cancer, fever EXAM: PORTABLE CHEST 1 VIEW COMPARISON:  04/06/2017 FINDINGS: Left IJ power port catheter tip SVC RA junction. Ill-defined right hilar mass noted but better demonstrated by CT comparison. Biapical pleural-parenchymal scarring and nodularity appears grossly stable. Mild background stable interstitial  prominence. No superimposed pneumonia, significant collapse or consolidation. Negative for CHF or edema. No large effusion or pneumothorax. Trachea is midline. Aorta is atherosclerotic. Bones are osteopenic. IMPRESSION: Stable right upper lobe hilar mass and chronic changes as above. No definite superimposed acute process or edema. No large effusion  or pneumothorax. Electronically Signed   By: Jerilynn Mages.  Shick M.D.   On: 04/28/2017 10:34       MICRO DATA: MRSA PCR  Urine  Blood Resp     ASSESSMENT/PLAN   71 year old lady with past medical history significant for COPD, stage III lung cancer, ongoing tobacco abuse, dyslipidemia, breast carcinoma who presented to the hospital with one-week history of weakness and just not feeling well. The patient was found to be neutropenic and hypotensive at the time of presentation.  She received 2 L of IV fluids in the emergency room and was admitted to the ICU for closer monitoring and further care.  Problem list  Neutropenic sepsis Severe sepsis with shock Lactic acidosis Thrombocytopenia-1 week post chemotherapy Chronic anemia COPD Ongoing tobacco abuse-half a pack of cigarettes a day Dyslipidemia History of breast cancer History of stage III lung cancer-currently on chemotherapy and radiation therapy  Admit to ICU. Patient was fluid resuscitated in the emergency room. On arrival to the ICU the patient is hemodynamically stable.  She is not on any vasopressors and is maintaining her mean arterial pressure more than 65. Continue IV fluids. Norepinephrine has been ordered.  It will be started if the patient's mean arterial pressures drop below 65. Continue vancomycin.  Given the degree of severity of her neutropenia, I have stopped the cefepime and have started Zosyn. Follow blood and urine cultures. As the patient takes Decadron only for 1 day post chemotherapy, she does not need stress dose steroids at this time. We will place the patient on  neutropenic precautions. Follow serial lactic acid levels. Patient already has a port and that is being accessed for antibiotics and will be access for vasopressors if needed.  Neutropenia, thrombocytopenia and anemia is likely secondary to chemotherapy.  However sepsis could be contributing to their severity. On Granix. Oncology consult ordered by the primary team. Continue monitoring for now. Transfuse blood for hemoglobin less than 7 and platelets for platelet count less than 10,000. Initiate neutropenic precautions.  As the patient has severe thrombocytopenia, I am stopping her aspirin and her platelet count improves.  Patient is on Advair at home.  Agree with changing it to [facility] (alternative medicine use in the hospital). Albuterol as needed. DC scheduled Atrovent.  Continue statins.  SCDs for DVT prophylaxis.  Cannot start heparin/enoxaparin given her severe thrombocytopenia.  Patient is DNR/DNI.  This was confirmed with the patient.   I have personally obtained a history, examined the patient, evaluated laboratory and independently reviewed  imaging results, formulated the assessment and plan and placed orders.  The Patient requires high complexity decision making for assessment and support, frequent evaluation and titration of therapies, application of advanced monitoring technologies and extensive interpretation of multiple databases. Critical Care Time devoted to patient care services described in this note is  55 minutes.      Nettie Elm, M.D.  Pulmonary and Critical Care Medicine

## 2017-04-28 NOTE — ED Notes (Signed)
Unable to urinate at this time. Will notify RN when able.

## 2017-04-28 NOTE — ED Notes (Signed)
Lidocaine jelly applied to port access per pt request and MD order. Will access soon.

## 2017-04-28 NOTE — ED Notes (Signed)
Pt hypotensive. ED MD and Hospitalist Guru MD notified. IVF being administered. Placed on pressure bag per MD verbal order. Will update MD once completed.

## 2017-04-28 NOTE — ED Notes (Signed)
Admitting Provider at bedside. 

## 2017-04-29 DIAGNOSIS — D702 Other drug-induced agranulocytosis: Secondary | ICD-10-CM

## 2017-04-29 DIAGNOSIS — D6959 Other secondary thrombocytopenia: Secondary | ICD-10-CM

## 2017-04-29 DIAGNOSIS — D6481 Anemia due to antineoplastic chemotherapy: Secondary | ICD-10-CM

## 2017-04-29 DIAGNOSIS — I714 Abdominal aortic aneurysm, without rupture: Secondary | ICD-10-CM

## 2017-04-29 DIAGNOSIS — Z9012 Acquired absence of left breast and nipple: Secondary | ICD-10-CM

## 2017-04-29 DIAGNOSIS — Z79899 Other long term (current) drug therapy: Secondary | ICD-10-CM

## 2017-04-29 DIAGNOSIS — J449 Chronic obstructive pulmonary disease, unspecified: Secondary | ICD-10-CM

## 2017-04-29 DIAGNOSIS — R5081 Fever presenting with conditions classified elsewhere: Secondary | ICD-10-CM

## 2017-04-29 DIAGNOSIS — Z923 Personal history of irradiation: Secondary | ICD-10-CM

## 2017-04-29 DIAGNOSIS — E7800 Pure hypercholesterolemia, unspecified: Secondary | ICD-10-CM

## 2017-04-29 DIAGNOSIS — C3411 Malignant neoplasm of upper lobe, right bronchus or lung: Secondary | ICD-10-CM

## 2017-04-29 DIAGNOSIS — Z853 Personal history of malignant neoplasm of breast: Secondary | ICD-10-CM

## 2017-04-29 DIAGNOSIS — F17210 Nicotine dependence, cigarettes, uncomplicated: Secondary | ICD-10-CM

## 2017-04-29 LAB — IRON AND TIBC
Iron: 11 ug/dL — ABNORMAL LOW (ref 28–170)
Saturation Ratios: 8 % — ABNORMAL LOW (ref 10.4–31.8)
TIBC: 141 ug/dL — ABNORMAL LOW (ref 250–450)
UIBC: 130 ug/dL

## 2017-04-29 LAB — COMPREHENSIVE METABOLIC PANEL
ALT: 13 U/L — ABNORMAL LOW (ref 14–54)
ANION GAP: 6 (ref 5–15)
AST: 15 U/L (ref 15–41)
Albumin: 2 g/dL — ABNORMAL LOW (ref 3.5–5.0)
Alkaline Phosphatase: 78 U/L (ref 38–126)
BUN: 9 mg/dL (ref 6–20)
CHLORIDE: 101 mmol/L (ref 101–111)
CO2: 24 mmol/L (ref 22–32)
Calcium: 6.9 mg/dL — ABNORMAL LOW (ref 8.9–10.3)
Creatinine, Ser: 0.77 mg/dL (ref 0.44–1.00)
Glucose, Bld: 87 mg/dL (ref 65–99)
POTASSIUM: 3.4 mmol/L — AB (ref 3.5–5.1)
Sodium: 131 mmol/L — ABNORMAL LOW (ref 135–145)
TOTAL PROTEIN: 4.7 g/dL — AB (ref 6.5–8.1)
Total Bilirubin: 1 mg/dL (ref 0.3–1.2)

## 2017-04-29 LAB — CBC
HCT: 25.5 % — ABNORMAL LOW (ref 35.0–47.0)
Hemoglobin: 8.3 g/dL — ABNORMAL LOW (ref 12.0–16.0)
MCH: 28.2 pg (ref 26.0–34.0)
MCHC: 32.7 g/dL (ref 32.0–36.0)
MCV: 86.2 fL (ref 80.0–100.0)
PLATELETS: 25 10*3/uL — AB (ref 150–440)
RBC: 2.96 MIL/uL — ABNORMAL LOW (ref 3.80–5.20)
RDW: 15.6 % — AB (ref 11.5–14.5)
WBC: 0.5 10*3/uL — AB (ref 3.6–11.0)

## 2017-04-29 LAB — PROTIME-INR
INR: 1.14
PROTHROMBIN TIME: 14.5 s (ref 11.4–15.2)

## 2017-04-29 LAB — HEMOGLOBIN AND HEMATOCRIT, BLOOD
HEMATOCRIT: 24.3 % — AB (ref 35.0–47.0)
HEMOGLOBIN: 8.1 g/dL — AB (ref 12.0–16.0)

## 2017-04-29 LAB — PREPARE RBC (CROSSMATCH)

## 2017-04-29 LAB — VITAMIN B12: Vitamin B-12: 247 pg/mL (ref 180–914)

## 2017-04-29 LAB — MAGNESIUM: MAGNESIUM: 1.8 mg/dL (ref 1.7–2.4)

## 2017-04-29 LAB — FOLATE: Folate: 8.8 ng/mL (ref >5.9)

## 2017-04-29 LAB — TROPONIN I: Troponin I: <0.03 ng/mL (ref <0.03)

## 2017-04-29 LAB — FERRITIN: Ferritin: 334 ng/mL — ABNORMAL HIGH (ref 11–307)

## 2017-04-29 MED ORDER — FILGRASTIM 480 MCG/1.6ML IJ SOLN
480.0000 ug | Freq: Once | INTRAMUSCULAR | Status: AC
  Administered 2017-04-29: 480 ug via SUBCUTANEOUS
  Filled 2017-04-29: qty 1.6

## 2017-04-29 MED ORDER — TBO-FILGRASTIM 480 MCG/0.8ML ~~LOC~~ SOSY
480.0000 ug | PREFILLED_SYRINGE | Freq: Once | SUBCUTANEOUS | Status: DC
  Filled 2017-04-29: qty 0.8

## 2017-04-29 MED ORDER — SODIUM CHLORIDE 0.9 % IV BOLUS (SEPSIS)
500.0000 mL | Freq: Once | INTRAVENOUS | Status: AC
  Administered 2017-04-29: 500 mL via INTRAVENOUS

## 2017-04-29 NOTE — Plan of Care (Signed)
Pt.'s VSS, pt. Received electrolyte replacements for K+, Phos & Mg as well as 1 unit of RBC's.

## 2017-04-29 NOTE — Progress Notes (Signed)
[facility] [place] NAME: Roberta [name]    MR#:  [id number]  DATE OF BIRTH:  01/31/1946  SUBJECTIVE:  CHIEF COMPLAINT:   Chief Complaint  Patient presents with  . Fever   The patient feels better but still hypotension with blood pressure at 80s.  REVIEW OF SYSTEMS:  Review of Systems  Constitutional: Positive for malaise/fatigue. Negative for chills and fever.  HENT: Negative for sore throat.   Eyes: Negative for blurred vision and double vision.  Respiratory: Negative for cough, hemoptysis, shortness of breath, wheezing and stridor.   Cardiovascular: Negative for chest pain, palpitations, orthopnea and leg swelling.  Gastrointestinal: Negative for abdominal pain, blood in stool, diarrhea, melena, nausea and vomiting.  Genitourinary: Negative for dysuria, flank pain and hematuria.  Musculoskeletal: Negative for back pain and joint pain.  Skin: Negative for rash.  Neurological: Positive for weakness. Negative for dizziness, sensory change, focal weakness, seizures, loss of consciousness and headaches.  Endo/Heme/Allergies: Negative for polydipsia.  Psychiatric/Behavioral: Negative for depression. The patient is not nervous/anxious.     DRUG ALLERGIES:  No Known Allergies VITALS:  Blood pressure (!) 83/43, pulse 98, temperature 98.1 F (36.7 C), temperature source Oral, resp. rate (!) 25, height '5\' 3"'  (1.6 m), weight 131 lb 6.3 oz (59.6 kg), SpO2 96 %. PHYSICAL EXAMINATION:  Physical Exam  Constitutional: She is oriented to person, place, and time and well-developed, well-nourished, and in no distress.  HENT:  Head: Normocephalic.  Mouth/Throat: Oropharynx is clear and moist.  Eyes: Conjunctivae and EOM are normal. Pupils are equal, round, and reactive to light. No scleral icterus.  Neck: Normal range of motion. Neck supple. No JVD present. No tracheal deviation present.  Cardiovascular: Normal rate, regular rhythm and normal  heart sounds. Exam reveals no gallop.  No murmur heard. Pulmonary/Chest: Effort normal and breath sounds normal. No respiratory distress. She has no wheezes. She has no rales.  Abdominal: Soft. Bowel sounds are normal. She exhibits no distension. There is no tenderness. There is no rebound.  Musculoskeletal: Normal range of motion. She exhibits no edema or tenderness.  Neurological: She is alert and oriented to person, place, and time. No cranial nerve deficit.  Skin: No rash noted. No erythema.  Psychiatric: Affect normal.   LABORATORY PANEL:  Female CBC Recent Labs  Lab 04/29/17 0539  WBC 0.5*  HGB 8.3*  HCT 25.5*  PLT 25*   ------------------------------------------------------------------------------------------------------------------ Chemistries  Recent Labs  Lab 04/29/17 0539  NA 131*  K 3.4*  CL 101  CO2 24  GLUCOSE 87  BUN 9  CREATININE 0.77  CALCIUM 6.9*  MG 1.8  AST 15  ALT 13*  ALKPHOS 78  BILITOT 1.0   RADIOLOGY:  No results found. ASSESSMENT AND PLAN:   Roberta [name]  is a 71 y.o. female with a known history of stage III small cell lung cancer, recent chemotherapy and radiation therapy, chronic history of COPD, hyperlipidemia, AAA status post stent, still continues to smoke, recently used Augmentin a few days ago for possible pneumonia is presenting to ED with a chief complaint of almost passing out spell and feeling weak and terrible. Patient was found to be hypotensive tachycardic and lactic acid 3.  # Sepsis with severe hypotension Met septic criteria with hypotension, tachycardia. Elevated lactic acid To new IV fluid boluses and pressors as needed. Broad-spectrum IV antibiotics cefepime and vancomycin Follow-up blood cultures, urinalysis, urine cultures and sputum culture and sensitivity Chest x-ray negative   #  Neutropenic fever with recent history of chemotherapy On Granix.  Follow CBC. Neutropenic precautions  #Stage III small cell lung  cancer status post chemotherapy and radiation therapy  #Thrombocytopenia No active bleeding or bruising Per Dr. Mike Gip,  Maintain platelets > 20,000.  Anemia of chronic disease.   Hemoglobin decreased to 7.0 last night , the patient got 1 unit of blood transfusion and hemoglobin is up to 8.3.  One morel unit PRBC transfusion due to hypotension.  #Chronic history of COPD he still continues to smoke Bronchodilator therapy  #Tobacco abuse disorder Consult patient to quit smoking, on nicotine patch  Hypokalemia.  Give potassium supplement and follow level. Hypomagnesemia.  Improved with IV magnesium. Hypophosphatemia.  Given phosphorus and follow-up level. Hyponatremia.  Continue normal saline IV and follow-up BMP.  All the records are reviewed and case discussed with Care Management/Social Worker. Management plans discussed with the patient, her son and daughter, and they are in agreement.  CODE STATUS: DNR  TOTAL TIME TAKING CARE OF THIS PATIENT: 42 minutes.   More than 50% of the time was spent in counseling/coordination of care: YES  POSSIBLE D/C IN 3 DAYS, DEPENDING ON CLINICAL CONDITION.   Demetrios Loll M.D on 04/29/2017 at 12:06 PM  Between 7am to 6pm - Pager - [phone]  After 6pm go to [email or web address] - [facility]

## 2017-04-29 NOTE — Consult Note (Signed)
[facility]  Date of admission:  04/28/2017  Inpatient day:  04/29/2017  Consulting physician: Dr. Nicholes Mango  Reason for Consultation:  Neutropenic fever.  Chief Complaint: Roberta [name] is a 71 y.o. female with localized small cell lung cancer who was admitted through the emergency room with sepsis.  HPI: The patient was diagnosed with stage IIIB mall cell lung cancer in 01/2017. She is status post 3 cycles of cis-platinum and etoposide (03/06/2007 13 - 04/16/2017).  She has received concurrent radiation from 03/08/2017 - 04/23/2017. She has not received Neulasta support in secondary to her radiation.    She was last seen in the medical oncology clinic on 04/23/2017.  At that time, she had a slight cough.  Shortness of breath was at baseline.    She was completing a course of Augmentin.  She received IVF secondary to hypotension (89/59).  She also received potassium.  CBC revealed a hematocrit of 32.6, hemoglobin 10.8, platelets 163,000, and WBC 3000.  She presented to the [facility] ER with weakness and presyncope.  She was febrile, tachycardic and hypotensive.  Lactic acid was 3.  CBC revealed a hematocrit of 27.8, hemoglobin 9.0, MCV 86.2, platelets 26,000, white count 300 with an ANC of 0.  Cultures were performed. Chest x-ray revealed a right upper lobe hilar mass and chronic changes with no superimposed pneumonia or edema.  She was started on vancomycin and Zosyn.  She received GCSF 480 mcg SQ x 1.  Symptomatically, she still feels fatigued.  She denies any shortness of breath. She denies any dizziness or lightheadedness.   Past Medical History:  Diagnosis Date  . AAA (abdominal aortic aneurysm) ([place])   . Breast cancer ([place]) 2002   left  . COPD (chronic obstructive pulmonary disease) ([place])   . High cholesterol   . Personal history of chemotherapy   . Small cell lung cancer ([place])   . Tobacco abuse     Past Surgical History:  Procedure Laterality Date  .  ABDOMINAL HYSTERECTOMY    . ENDOBRONCHIAL ULTRASOUND N/A 02/19/2017   Procedure: ENDOBRONCHIAL ULTRASOUND;  Surgeon: Flora Lipps, MD;  Location: [facility] ORS;  Service: Cardiopulmonary;  Laterality: N/A;  . ENDOVASCULAR REPAIR/STENT GRAFT N/A 12/06/2016   Procedure: Endovascular Repair/Stent Graft;  Surgeon: Katha Cabal, MD;  Location: [place] CV LAB;  Service: Cardiovascular;  Laterality: N/A;  . IR FLUORO GUIDE PORT INSERTION RIGHT  03/02/2017  . MASTECTOMY Left 2002   with sentinel node     Family History  Problem Relation Age of Onset  . Breast cancer Neg Hx     Social History:  reports that she has been smoking cigarettes.  She has been smoking about 0.50 packs per day. she has never used smokeless tobacco. She reports that she does not drink alcohol or use drugs.  Allergies: No Known Allergies  Medications Prior to Admission  Medication Sig Dispense Refill  . albuterol (PROVENTIL HFA;VENTOLIN HFA) 108 (90 Base) MCG/ACT inhaler Inhale 2 puffs into the lungs every 6 (six) hours as needed for wheezing or shortness of breath. 1 Inhaler 1  . aspirin EC 81 MG tablet Take 81 mg by mouth daily.    Marland Kitchen atorvastatin (LIPITOR) 10 MG tablet Take 10 mg by mouth at bedtime.     Marland Kitchen dexamethasone (DECADRON) 4 MG tablet Take 2 tablets two times a day for 1 day on day 4 after cisplatin chemotherapy. Take with food. 30 tablet 1  . fluticasone-salmeterol (ADVAIR HFA) 115-21 MCG/ACT  inhaler Inhale 2 puffs into the lungs 2 (two) times daily.    Marland Kitchen guaifenesin (ROBITUSSIN) 100 MG/5ML syrup Take 200 mg 3 (three) times daily as needed by mouth for cough.    Marland Kitchen ipratropium (ATROVENT) 0.02 % nebulizer solution Take 0.5 mg by nebulization 4 (four) times daily.    Marland Kitchen lidocaine-prilocaine (EMLA) cream Apply to affected area once 30 g 3  . LORazepam (ATIVAN) 0.5 MG tablet Take 1 tablet (0.5 mg total) by mouth every 6 (six) hours as needed (Nausea or vomiting). 30 tablet 0  . nystatin (MYCOSTATIN) 100000  UNIT/ML suspension Use as directed 5 mLs in the mouth or throat 4 (four) times daily. Swish and swallow    . ondansetron (ZOFRAN) 8 MG tablet Take 1 tablet (8 mg total) by mouth 2 (two) times daily as needed. Start on the third day after cisplatin chemotherapy. 30 tablet 1  . sucralfate (CARAFATE) 1 g tablet Take 1 tablet (1 g total) 4 (four) times daily -  with meals and at bedtime by mouth. dissolve in 8 oz of water 90 tablet 0  . amoxicillin-clavulanate (AUGMENTIN) 875-125 MG tablet Take 1 tablet 2 (two) times daily by mouth. (Patient not taking: Reported on 04/28/2017) 14 tablet 0  . oxyCODONE (OXY IR/ROXICODONE) 5 MG immediate release tablet Take 1 tablet (5 mg total) by mouth every 6 (six) hours as needed for severe pain. (Patient not taking: Reported on 04/28/2017) 30 tablet 0  . prochlorperazine (COMPAZINE) 10 MG tablet Take 1 tablet (10 mg total) by mouth every 6 (six) hours as needed (Nausea or vomiting). (Patient not taking: Reported on 04/28/2017) 30 tablet 1    Review of Systems: GENERAL:  Fatigue.  No fevers.  No sweats. PERFORMANCE STATUS (ECOG):  2 HEENT:  No visual changes, runny nose, sore throat, mouth sores or tenderness. Lungs: No increased shortness of breath.  Chronic cough.  No hemoptysis. Cardiac:  No chest pain, palpitations, orthopnea, or PND. GI:  Decreased oral intake.  No nausea, vomiting, diarrhea, constipation, melena or hematochezia. GU:  No urgency, frequency, dysuria, or hematuria. Musculoskeletal:  No back pain.  No joint pain.  No muscle tenderness. Extremities:  No pain or swelling. Skin:  No rashes or skin changes. Neuro:  No headache, numbness or weakness, balance or coordination issues. Endocrine:  No diabetes, thyroid issues, hot flashes or night sweats. Psych:  No mood changes, depression or anxiety. Pain:  No focal pain. Review of systems:  All other systems reviewed and found to be negative.  Physical Exam:  Blood pressure (!) 105/59, pulse (!) 105,  temperature 99.1 F (37.3 C), temperature source Oral, resp. rate (!) 26, height _0  (1.6 m), weight 131 lb 6.3 oz (59.6 kg), SpO2 94 %.  GENERAL:  Thin elderly woman sitting comfortably in a lounge chair in the ICU in no acute distress. MENTAL STATUS:  Alert and oriented to person, place and time. HEAD:  Alopecia.  Normocephalic, atraumatic, face symmetric, no Cushingoid features. EYES:  Pupils equal round and reactive to light and accomodation.  No conjunctivitis or scleral icterus. ENT:  Hettick in place.  Oropharynx clear without lesion.  Tongue normal. Mucous membranes dry.  RESPIRATORY:  Clear to auscultation without rales, wheezes or rhonchi. CARDIOVASCULAR:  Regular rate and rhythm without murmur, rub or gallop. CHEST WALL:  Port-a-cath site unremarkable. ABDOMEN:  Soft, non-tender, with active bowel sounds, and no hepatosplenomegaly.  No masses. SKIN:  Upper extremity ecchymosis.  Lower extremity scratches/bruises s/p fall.  No  rashes, ulcers or lesions. EXTREMITIES: No edema, no skin discoloration or tenderness.  No palpable cords. LYMPH NODES: No palpable cervical, supraclavicular or axillary adenopathy  NEUROLOGICAL: Unremarkable. PSYCH:  Appropriate.   Results for orders placed or performed during the hospital encounter of 04/28/17 (from the past 48 hour(s))  Lactic acid, plasma     Status: Abnormal   Collection Time: 04/28/17 10:02 AM  Result Value Ref Range   Lactic Acid, Venous 3.0 (HH) 0.5 - 1.9 mmol/L    Comment: CRITICAL RESULT CALLED TO, READ BACK BY AND VERIFIED WITH HUNTER ORR ON 04/28/17 AT 1045 [facility]   Comprehensive metabolic panel     Status: Abnormal   Collection Time: 04/28/17 10:02 AM  Result Value Ref Range   Sodium 136 135 - 145 mmol/L   Potassium 3.6 3.5 - 5.1 mmol/L   Chloride 95 (L) 101 - 111 mmol/L   CO2 29 22 - 32 mmol/L   Glucose, Bld 116 (H) 65 - 99 mg/dL   BUN 16 6 - 20 mg/dL   Creatinine, Ser 0.65 0.44 - 1.00 mg/dL   Calcium 8.4 (L) 8.9 - 10.3  mg/dL   Total Protein 5.9 (L) 6.5 - 8.1 g/dL   Albumin 2.8 (L) 3.5 - 5.0 g/dL   AST 18 15 - 41 U/L   ALT 13 (L) 14 - 54 U/L   Alkaline Phosphatase 100 38 - 126 U/L   Total Bilirubin 0.9 0.3 - 1.2 mg/dL   GFR calc non Af Amer >60 >60 mL/min   GFR calc Af Amer >60 >60 mL/min    Comment: (NOTE) The eGFR has been calculated using the CKD EPI equation. This calculation has not been validated in all clinical situations. eGFR's persistently <60 mL/min signify possible Chronic Kidney Disease.    Anion gap 12 5 - 15  Lipase, blood     Status: None   Collection Time: 04/28/17 10:02 AM  Result Value Ref Range   Lipase 15 11 - 51 U/L  Troponin I     Status: None   Collection Time: 04/28/17 10:02 AM  Result Value Ref Range   Troponin I <0.03 <0.03 ng/mL  CBC WITH DIFFERENTIAL     Status: Abnormal   Collection Time: 04/28/17 10:02 AM  Result Value Ref Range   WBC 0.3 (LL) 3.6 - 11.0 K/uL    Comment: RESULT REPEATED AND VERIFIED TOO FEW TO COUNT, SMEAR AVAILABLE FOR REVIEW CRITICAL RESULT CALLED TO, READ BACK BY AND VERIFIED WITH: HUNTER ORE ON 04/28/17 AT 113[address] [place]    RBC 3.23 (L) 3.80 - 5.20 MIL/uL   Hemoglobin 9.0 (L) 12.0 - 16.0 g/dL   HCT 27.8 (L) 35.0 - 47.0 %   MCV 86.2 80.0 - 100.0 fL   MCH 28.0 26.0 - 34.0 pg   MCHC 32.4 32.0 - 36.0 g/dL   RDW 16.2 (H) 11.5 - 14.5 %   Platelets 26 (LL) 150 - 440 K/uL    Comment: CRITICAL RESULT CALLED TO, READ BACK BY AND VERIFIED WITH: HUNTER ORE ON 04/28/17 AT 1137 [place] PLATELET COUNT CONFIRMED BY SMEAR    Neutrophils Relative % 10 %   Neutro Abs 0.0 (L) 1.4 - 6.5 K/uL   Lymphocytes Relative 43 %   Lymphs Abs 0.1 (L) 1.0 - 3.6 K/uL   Monocytes Relative 43 %   Monocytes Absolute 0.1 (L) 0.2 - 0.9 K/uL   Eosinophils Relative 1 %   Eosinophils Absolute 0.0 0 - 0.7 K/uL   Basophils Relative 3 %  Basophils Absolute 0.0 0 - 0.1 K/uL  Procalcitonin     Status: None   Collection Time: 04/28/17 10:02 AM  Result Value Ref Range    Procalcitonin 0.10 ng/mL    Comment:        Interpretation: PCT (Procalcitonin) <= 0.5 ng/mL: Systemic infection (sepsis) is not likely. Local bacterial infection is possible. (NOTE)       Sepsis PCT Algorithm           Lower Respiratory Tract                                      Infection PCT Algorithm    ----------------------------     ----------------------------         PCT < 0.25 ng/mL                PCT < 0.10 ng/mL         Strongly encourage             Strongly discourage   discontinuation of antibiotics    initiation of antibiotics    ----------------------------     -----------------------------       PCT 0.25 - 0.50 ng/mL            PCT 0.10 - 0.25 ng/mL               OR       >80% decrease in PCT            Discourage initiation of                                            antibiotics      Encourage discontinuation           of antibiotics    ----------------------------     -----------------------------         PCT >= 0.50 ng/mL              PCT 0.26 - 0.50 ng/mL               AND        <80% decrease in PCT             Encourage initiation of                                             antibiotics       Encourage continuation           of antibiotics    ----------------------------     -----------------------------        PCT >= 0.50 ng/mL                  PCT > 0.50 ng/mL               AND         increase in PCT                  Strongly encourage  initiation of antibiotics    Strongly encourage escalation           of antibiotics                                     -----------------------------                                           PCT <= 0.25 ng/mL                                                 OR                                        > 80% decrease in PCT                                     Discontinue / Do not initiate                                             antibiotics   Protime-INR     Status: None    Collection Time: 04/28/17 10:02 AM  Result Value Ref Range   Prothrombin Time 13.1 11.4 - 15.2 seconds   INR 1.00   Blood Culture (routine x 2)     Status: None (Preliminary result)   Collection Time: 04/28/17 10:02 AM  Result Value Ref Range   Specimen Description BLOOD BLOOD RIGHT HAND    Special Requests      BOTTLES DRAWN AEROBIC AND ANAEROBIC Blood Culture results may not be optimal due to an excessive volume of blood received in culture bottles   Culture NO GROWTH < 24 HOURS    Report Status PENDING   Influenza panel by PCR (type A & B)     Status: None   Collection Time: 04/28/17 10:02 AM  Result Value Ref Range   Influenza A By PCR NEGATIVE NEGATIVE   Influenza B By PCR NEGATIVE NEGATIVE    Comment: (NOTE) The Xpert Xpress Flu assay is intended as an aid in the diagnosis of  influenza and should not be used as a sole basis for treatment.  This  assay is FDA approved for nasopharyngeal swab specimens only. Nasal  washings and aspirates are unacceptable for Xpert Xpress Flu testing.   Blood Culture (routine x 2)     Status: None (Preliminary result)   Collection Time: 04/28/17 10:09 AM  Result Value Ref Range   Specimen Description BLOOD BLOOD RIGHT WRIST    Special Requests      BOTTLES DRAWN AEROBIC AND ANAEROBIC Blood Culture adequate volume   Culture NO GROWTH < 24 HOURS    Report Status PENDING   Lactic acid, plasma     Status: None   Collection Time: 04/28/17 12:51 PM  Result Value Ref Range   Lactic Acid, Venous 0.8 0.5 - 1.9  mmol/L  Glucose, capillary     Status: Abnormal   Collection Time: 04/28/17  1:52 PM  Result Value Ref Range   Glucose-Capillary 111 (H) 65 - 99 mg/dL  MRSA PCR Screening     Status: None   Collection Time: 04/28/17  3:52 PM  Result Value Ref Range   MRSA by PCR NEGATIVE NEGATIVE    Comment:        The GeneXpert MRSA Assay (FDA approved for NASAL specimens only), is one component of a comprehensive MRSA colonization surveillance  program. It is not intended to diagnose MRSA infection nor to guide or monitor treatment for MRSA infections.   CBC with Differential     Status: Abnormal   Collection Time: 04/28/17  3:56 PM  Result Value Ref Range   WBC 0.2 (LL) 3.6 - 11.0 K/uL    Comment: REPEATED TO VERIFY CRITICAL VALUE NOTED.  VALUE IS CONSISTENT WITH PREVIOUSLY REPORTED AND CALLED VALUE.    RBC 2.73 (L) 3.80 - 5.20 MIL/uL   Hemoglobin 7.8 (L) 12.0 - 16.0 g/dL   HCT 23.5 (L) 35.0 - 47.0 %   MCV 86.1 80.0 - 100.0 fL   MCH 28.6 26.0 - 34.0 pg   MCHC 33.1 32.0 - 36.0 g/dL   RDW 15.7 (H) 11.5 - 14.5 %   Platelets 26 (LL) 150 - 440 K/uL    Comment: REPEATED TO VERIFY CRITICAL VALUE NOTED.  VALUE IS CONSISTENT WITH PREVIOUSLY REPORTED AND CALLED VALUE.    Neutrophils Relative % 7 %   Lymphocytes Relative 37 %   Monocytes Relative 50 %   Eosinophils Relative 3 %   Basophils Relative 3 %   Neutro Abs 0.0 (L) 1.4 - 6.5 K/uL   Lymphs Abs 0.1 (L) 1.0 - 3.6 K/uL   Monocytes Absolute 0.1 (L) 0.2 - 0.9 K/uL   Eosinophils Absolute 0.0 0 - 0.7 K/uL   Basophils Absolute 0.0 0 - 0.1 K/uL   RBC Morphology POLYCHROMASIA PRESENT     Comment: MIXED RBC POPULATION TOO FEW TO COUNT, SMEAR AVAILABLE FOR REVIEW   Comprehensive metabolic panel     Status: Abnormal   Collection Time: 04/28/17  3:56 PM  Result Value Ref Range   Sodium 132 (L) 135 - 145 mmol/L   Potassium 2.9 (L) 3.5 - 5.1 mmol/L   Chloride 98 (L) 101 - 111 mmol/L   CO2 24 22 - 32 mmol/L   Glucose, Bld 125 (H) 65 - 99 mg/dL   BUN 14 6 - 20 mg/dL   Creatinine, Ser 0.65 0.44 - 1.00 mg/dL   Calcium 7.0 (L) 8.9 - 10.3 mg/dL   Total Protein 4.9 (L) 6.5 - 8.1 g/dL   Albumin 2.3 (L) 3.5 - 5.0 g/dL   AST 14 (L) 15 - 41 U/L   ALT 12 (L) 14 - 54 U/L   Alkaline Phosphatase 85 38 - 126 U/L   Total Bilirubin 0.9 0.3 - 1.2 mg/dL   GFR calc non Af Amer >60 >60 mL/min   GFR calc Af Amer >60 >60 mL/min    Comment: (NOTE) The eGFR has been calculated using the CKD  EPI equation. This calculation has not been validated in all clinical situations. eGFR's persistently <60 mL/min signify possible Chronic Kidney Disease.    Anion gap 10 5 - 15  Procalcitonin     Status: None   Collection Time: 04/28/17  3:56 PM  Result Value Ref Range   Procalcitonin 0.17 ng/mL    Comment:  Interpretation: PCT (Procalcitonin) <= 0.5 ng/mL: Systemic infection (sepsis) is not likely. Local bacterial infection is possible. (NOTE)       Sepsis PCT Algorithm           Lower Respiratory Tract                                      Infection PCT Algorithm    ----------------------------     ----------------------------         PCT < 0.25 ng/mL                PCT < 0.10 ng/mL         Strongly encourage             Strongly discourage   discontinuation of antibiotics    initiation of antibiotics    ----------------------------     -----------------------------       PCT 0.25 - 0.50 ng/mL            PCT 0.10 - 0.25 ng/mL               OR       >80% decrease in PCT            Discourage initiation of                                            antibiotics      Encourage discontinuation           of antibiotics    ----------------------------     -----------------------------         PCT >= 0.50 ng/mL              PCT 0.26 - 0.50 ng/mL               AND        <80% decrease in PCT             Encourage initiation of                                             antibiotics       Encourage continuation           of antibiotics    ----------------------------     -----------------------------        PCT >= 0.50 ng/mL                  PCT > 0.50 ng/mL               AND         increase in PCT                  Strongly encourage                                      initiation of antibiotics    Strongly encourage escalation           of antibiotics                                     -----------------------------  PCT <= 0.25  ng/mL                                                 OR                                        > 80% decrease in PCT                                     Discontinue / Do not initiate                                             antibiotics   Protime-INR     Status: None   Collection Time: 04/28/17  3:56 PM  Result Value Ref Range   Prothrombin Time 14.1 11.4 - 15.2 seconds   INR 1.10   APTT     Status: None   Collection Time: 04/28/17  3:56 PM  Result Value Ref Range   aPTT 31 24 - 36 seconds  Troponin I     Status: None   Collection Time: 04/28/17  7:45 PM  Result Value Ref Range   Troponin I <0.03 <0.03 ng/mL  Magnesium     Status: Abnormal   Collection Time: 04/28/17  7:45 PM  Result Value Ref Range   Magnesium 0.9 (LL) 1.7 - 2.4 mg/dL    Comment: CRITICAL RESULT CALLED TO, READ BACK BY AND VERIFIED WITH BRITTON LEE RUST CHESTER AT 2030 04/28/17.PMH  Phosphorus     Status: Abnormal   Collection Time: 04/28/17  7:45 PM  Result Value Ref Range   Phosphorus 2.3 (L) 2.5 - 4.6 mg/dL  Hemoglobin and hematocrit, blood     Status: Abnormal   Collection Time: 04/28/17  9:54 PM  Result Value Ref Range   Hemoglobin 7.0 (L) 12.0 - 16.0 g/dL    Comment: RESULT REPEATED AND VERIFIED   HCT 21.1 (L) 35.0 - 47.0 %  Potassium     Status: None   Collection Time: 04/28/17  9:54 PM  Result Value Ref Range   Potassium 3.7 3.5 - 5.1 mmol/L  Type and screen [place]     Status: None (Preliminary result)   Collection Time: 04/28/17 11:56 PM  Result Value Ref Range   ABO/RH(D) A NEG    Antibody Screen NEG    Sample Expiration 05/01/2017    Unit Number [id number]    Blood Component Type RED CELLS,LR    Unit division 00    Status of Unit ISSUED    Transfusion Status OK TO TRANSFUSE    Crossmatch Result Compatible   Prepare RBC     Status: None   Collection Time: 04/28/17 11:56 PM  Result Value Ref Range   Order Confirmation ORDER PROCESSED BY BLOOD BANK    Troponin I     Status: None   Collection Time: 04/29/17  1:40 AM  Result Value Ref Range   Troponin I <0.03 <0.03 ng/mL  CBC     Status: Abnormal   Collection Time: 04/29/17  5:39 AM  Result Value Ref Range   WBC 0.5 (LL) 3.6 - 11.0 K/uL    Comment: CRITICAL RESULT CALLED TO, READ BACK BY AND VERIFIED WITH: BRITTONLEE RUST CHESTER AT 0615 04/29/17 ALV    RBC 2.96 (L) 3.80 - 5.20 MIL/uL   Hemoglobin 8.3 (L) 12.0 - 16.0 g/dL   HCT 25.5 (L) 35.0 - 47.0 %   MCV 86.2 80.0 - 100.0 fL   MCH 28.2 26.0 - 34.0 pg   MCHC 32.7 32.0 - 36.0 g/dL   RDW 15.6 (H) 11.5 - 14.5 %   Platelets 25 (LL) 150 - 440 K/uL    Comment: CRITICAL RESULT CALLED TO, READ BACK BY AND VERIFIED WITH: BRITTONLEE RUST CHESTER AT 0615 04/29/17 ALV PLATELET COUNT CONFIRMED BY SMEAR   Comprehensive metabolic panel     Status: Abnormal   Collection Time: 04/29/17  5:39 AM  Result Value Ref Range   Sodium 131 (L) 135 - 145 mmol/L   Potassium 3.4 (L) 3.5 - 5.1 mmol/L   Chloride 101 101 - 111 mmol/L   CO2 24 22 - 32 mmol/L   Glucose, Bld 87 65 - 99 mg/dL   BUN 9 6 - 20 mg/dL   Creatinine, Ser 0.77 0.44 - 1.00 mg/dL   Calcium 6.9 (L) 8.9 - 10.3 mg/dL   Total Protein 4.7 (L) 6.5 - 8.1 g/dL   Albumin 2.0 (L) 3.5 - 5.0 g/dL   AST 15 15 - 41 U/L   ALT 13 (L) 14 - 54 U/L   Alkaline Phosphatase 78 38 - 126 U/L   Total Bilirubin 1.0 0.3 - 1.2 mg/dL   GFR calc non Af Amer >60 >60 mL/min   GFR calc Af Amer >60 >60 mL/min    Comment: (NOTE) The eGFR has been calculated using the CKD EPI equation. This calculation has not been validated in all clinical situations. eGFR's persistently <60 mL/min signify possible Chronic Kidney Disease.    Anion gap 6 5 - 15  Protime-INR     Status: None   Collection Time: 04/29/17  5:39 AM  Result Value Ref Range   Prothrombin Time 14.5 11.4 - 15.2 seconds   INR 1.14   Magnesium     Status: None   Collection Time: 04/29/17  5:39 AM  Result Value Ref Range   Magnesium 1.8 1.7 - 2.4  mg/dL   Dg Chest Port 1 View  Result Date: 04/28/2017 CLINICAL DATA:  Known right upper lobe small cell lung cancer, fever EXAM: PORTABLE CHEST 1 VIEW COMPARISON:  04/06/2017 FINDINGS: Left IJ power port catheter tip SVC RA junction. Ill-defined right hilar mass noted but better demonstrated by CT comparison. Biapical pleural-parenchymal scarring and nodularity appears grossly stable. Mild background stable interstitial prominence. No superimposed pneumonia, significant collapse or consolidation. Negative for CHF or edema. No large effusion or pneumothorax. Trachea is midline. Aorta is atherosclerotic. Bones are osteopenic. IMPRESSION: Stable right upper lobe hilar mass and chronic changes as above. No definite superimposed acute process or edema. No large effusion or pneumothorax. Electronically Signed   By: Jerilynn Mages.  Shick M.D.   On: 04/28/2017 10:34    Assessment:  The patient is a 71 y.o. woman with stage IIIB small cell lung cancer currently day 14 s/p cycle #3 cisplatin and etoposide.  She completed chest radiation on 04/23/2017.  She was admitted with fever and neutropenia.  CXR is negative.  Blood cultures are negative to date.  She has probable chemotherapy induced anemia and thrombocytopenia.  Increased platelet consumption secondary to infection.  WBC is low post chemotherapy as she did not received Neulasta (secondary to radiation).  WBC has improved s/p initiation of Granix yesterday.    Plan:   1.  Oncology:  Day 14 s/p cycle #3 cisplatin and etoposide.  Neutropenic precautions.  Anticipate cycle #4 in OPD on 05/07/2017.  2.  Hematology:  Likely chemotherapy induced anemia.  Work-up ordered.  Maintain platelets > 20,000.  Maintain active type and screen.  Patient to be transfused 1 unit of PRBCs today per nursing secondary to low pressure.  GCSF daily until [place] > 3000.  3.  Infectious disease:  Continue broad spectrum antibiotics (vancomycin and Zosyn). Cultures negative to date.  Blood  cultures q 24 hours prn T >= 100.4.  4:  Cardiovascular:  Patient in ICU.  Hypotension.  Patient receiving IVF and PRBCs today.   Thank you for allowing me to participate in SIVAN CUELLO 's care.  I will follow her closely with you until Dr Elroy Channel return on Monday, 04/30/2017.   Lequita Asal, MD  04/29/2017, 11:27 AM

## 2017-04-29 NOTE — Progress Notes (Signed)
Verbal orders per Dr.Khan. to give 500 cc bolus for pt hypotension. If not resolved give second 500 cc bolus, if hypotension does not resolve orders to notify Dr.Khan. Will continue to assess.

## 2017-04-29 NOTE — Progress Notes (Addendum)
Second message left for Dr. Mike Gip - we don't have Granix in stock but there is some Neupogen. Need V/O to substitute. Will continue trying to reach MD.  14:00 Dr. Mike Gip called back and gave V/O to sub to Neupogen 480 subcutaneously once. Order modified.   Kezia Benevides A. [place], [place].D., BCPS Clinical Pharmacist 04/29/2017 12:56

## 2017-04-29 NOTE — Consult Note (Signed)
Pharmacy Antibiotic Note  Roberta [name] is a 71 y.o. female admitted on 04/28/2017 with neutropenic fever, sepsis.  Pharmacy has been consulted for vancomycin and cefepime dosing. Pt has PMH of stage II small cell lung cancer currenlty receiving chemotherapy/radiation.  Plan: Patient received 1g of vancomycin in the ED. Will give next dose in 8 hours Vancomycin 1000mg  IV every 18 hours.  Goal trough 15-20 mcg/mL.  Trough prior to the 5th total dose Zosyn 3.375g q 8hr EI infusion  12/2 Renal function stable, trough moved to 12/4 at 0230, before 4th dose of regimen  Height: 5\' 3"  (160 cm) Weight: 131 lb 6.3 oz (59.6 kg) IBW/kg (Calculated) : 52.4  Temp (24hrs), Avg:99.2 F (37.3 C), Min:97.8 F (36.6 C), Max:100.2 F (37.9 C)  Recent Labs  Lab 04/23/17 0832 04/23/17 0840 04/28/17 1002 04/28/17 1251 04/28/17 1556 04/29/17 0539  WBC 3.0*  --  0.3*  --  0.2* 0.5*  CREATININE  --  0.80 0.65  --  0.65 0.77  LATICACIDVEN  --   --  3.0* 0.8  --   --     Estimated Creatinine Clearance: 54.1 mL/min (by C-G formula based on SCr of 0.77 mg/dL).    No Known Allergies  Antimicrobials this admission: cefepime 12/1 >> one dose vancomycin 12/1 >>  Zosyn 12/1>>  Dose adjustments this admission:   Microbiology results: 12/1 BCx: NGTD UCx sent  Thank you for allowing pharmacy to be a part of this patient's care.  Rayna Sexton, PharmD, BCPS Clinical Pharmacist 04/29/2017 2:42 PM    04/29/2017 2:36 PM

## 2017-04-29 NOTE — Progress Notes (Signed)
[facility] Consultation     Date: 04/29/2017,   MRN# [id number] Roberta [name] 1945/11/12 Code Status:     Code Status Orders  (From admission, onward)        Start     Ordered   04/28/17 1147  Do not attempt resuscitation (DNR)  Continuous    Question Answer Comment  In the event of cardiac or respiratory ARREST Do not call a "code blue"   In the event of cardiac or respiratory ARREST Do not perform Intubation, CPR, defibrillation or ACLS   In the event of cardiac or respiratory ARREST Use medication by any route, position, wound care, and other measures to relive pain and suffering. May use oxygen, suction and manual treatment of airway obstruction as needed for comfort.   Comments RN may pronounce      04/28/17 1146    Code Status History    Date Active Date Inactive Code Status Order ID Comments User Context   12/06/2016 11:17 12/07/2016 15:47 Full Code [id number]  Algernon Huxley, MD Inpatient     [facility] day:@LENGTHOFSTAYDAYS @ Referring MD: @ATDPROV @     PCP:      AdmissionWeight: 130 lb (59 kg)                 CurrentWeight: 131 lb 6.3 oz (59.6 kg) Roberta [name] is a 71 y.o. old female     SUBJECTIVE:   Endorsed lethargy. Denies chest pain, nausea, vomiting, shortness of breath.   Hemoglobin drop noted.  No overt bleeding. Transfuse PRBCs. Not requiring pressors.chest pain, chest pressure, nausea, vomiting. Endorse le   MEDICATIONS    Home Medication:    Current Medication:   Current Facility-Administered Medications:  .  0.9 %  sodium chloride infusion, , Intravenous, Continuous, Gouru, Aruna, MD, Last Rate: 125 mL/hr at 04/29/17 2100 .  albuterol (PROVENTIL) (2.5 MG/3ML) 0.083% nebulizer solution 2.5 mg, 2.5 mg, Nebulization, Q6H PRN, Gouru, Aruna, MD .  atorvastatin (LIPITOR) tablet 10 mg, 10 mg, Oral, QHS, Gouru, Aruna, MD, 10 mg at 04/28/17 2141 .  docusate sodium (COLACE) capsule 100 mg, 100 mg, Oral, BID, Gouru, Aruna, MD,  100 mg at 04/29/17 1024 .  guaifenesin (ROBITUSSIN) 100 MG/5ML syrup 200 mg, 200 mg, Oral, TID PRN, Patria Mane, Magadalene S, NP, 200 mg at 04/29/17 1833 .  lidocaine (XYLOCAINE) 2 % jelly 1 application, 1 application, Other, Once, Darel Hong, MD, Stopped at 04/28/17 1507 .  LORazepam (ATIVAN) tablet 0.5 mg, 0.5 mg, Oral, Q6H PRN, Gouru, Aruna, MD .  MEDLINE mouth rinse, 15 mL, Mouth Rinse, BID, Nettie Elm, MD, 15 mL at 04/28/17 2153 .  mometasone-formoterol (DULERA) 200-5 MCG/ACT inhaler 2 puff, 2 puff, Inhalation, BID, Gouru, Aruna, MD, 2 puff at 04/29/17 0752 .  nicotine (NICODERM CQ - dosed in mg/24 hours) patch 14 mg, 14 mg, Transdermal, Daily, Gouru, Aruna, MD, 14 mg at 04/29/17 1000 .  norepinephrine (LEVOPHED) 4 mg in dextrose 5 % 250 mL (0.016 mg/mL) infusion, 0-40 mcg/min, Intravenous, Once, Darel Hong, MD, Stopped at 04/28/17 1508 .  nystatin (MYCOSTATIN) 100000 UNIT/ML suspension 500,000 Units, 5 mL, Mouth/Throat, QID, Gouru, Aruna, MD, 500,000 Units at 04/29/17 1749 .  ondansetron (ZOFRAN) tablet 4 mg, 4 mg, Oral, Q6H PRN, Gouru, Aruna, MD .  piperacillin-tazobactam (ZOSYN) IVPB 3.375 g, 3.375 g, Intravenous, Q8H, Maccia, Melissa D, RPH, Last Rate: 12.5 mL/hr at 04/29/17 1438, 3.375 g at 04/29/17 1438 .  sucralfate (CARAFATE) tablet 1 g, 1 g, Oral, TID WC &  HS, Gouru, Aruna, MD, 1 g at 04/29/17 1625 .  vancomycin (VANCOCIN) IVPB 1000 mg/200 mL premix, 1,000 mg, Intravenous, Q18H, Darel Hong, MD, Stopped at 04/29/17 1740  Facility-Administered Medications Ordered in Other Encounters:  .  sodium chloride flush (NS) 0.9 % injection 10 mL, 10 mL, Intravenous, PRN, Sindy Guadeloupe, MD, 10 mL at 03/26/17 0948 .  sodium chloride flush (NS) 0.9 % injection 10 mL, 10 mL, Intravenous, PRN, Sindy Guadeloupe, MD, 10 mL at 04/16/17 1006     REVIEW OF SYSTEMS    VS: BP 102/65   Pulse 96   Temp 98.1 F (36.7 C) (Oral)   Resp (!) 27   Ht 5\' 3"  (1.6 m)   Wt 131 lb 6.3 oz (59.6 kg)    SpO2 95%   BMI 23.28 kg/m      PHYSICAL EXAM   Awake, alert, oriented.  No apparent distress. Sclera anicteric. Oral mucosa moist. CVS S1, S2, 0. Chest is clear to auscultation bilaterally with no rales rhonchi or wheezes. Abdomen is soft, nontender, nondistended. No lower extremity edema bilaterally    LABS    Recent Labs    04/28/17 1002 04/28/17 1556 04/28/17 2154 04/29/17 0539 04/29/17 1435  HGB 9.0* 7.8* 7.0* 8.3* 8.1*  HCT 27.8* 23.5* 21.1* 25.5* 24.3*  MCV 86.2 86.1  --  86.2  --   WBC 0.3* 0.2*  --  0.5*  --   BUN 16 14  --  9  --   CREATININE 0.65 0.65  --  0.77  --   GLUCOSE 116* 125*  --  87  --   CALCIUM 8.4* 7.0*  --  6.9*  --   INR 1.00 1.10  --  1.14  --   ,    No results for input(s): PH in the last 72 hours.  Invalid input(s): PCO2, PO2, BASEEXCESS, BASEDEFICITE, TFT    CULTURE RESULTS   Recent Results (from the past 240 hour(s))  Blood Culture (routine x 2)     Status: None (Preliminary result)   Collection Time: 04/28/17 10:02 AM  Result Value Ref Range Status   Specimen Description BLOOD BLOOD RIGHT HAND  Final   Special Requests   Final    BOTTLES DRAWN AEROBIC AND ANAEROBIC Blood Culture results may not be optimal due to an excessive volume of blood received in culture bottles   Culture NO GROWTH < 24 HOURS  Final   Report Status PENDING  Incomplete  Blood Culture (routine x 2)     Status: None (Preliminary result)   Collection Time: 04/28/17 10:09 AM  Result Value Ref Range Status   Specimen Description BLOOD BLOOD RIGHT WRIST  Final   Special Requests   Final    BOTTLES DRAWN AEROBIC AND ANAEROBIC Blood Culture adequate volume   Culture NO GROWTH < 24 HOURS  Final   Report Status PENDING  Incomplete  MRSA PCR Screening     Status: None   Collection Time: 04/28/17  3:52 PM  Result Value Ref Range Status   MRSA by PCR NEGATIVE NEGATIVE Final    Comment:        The GeneXpert MRSA Assay (FDA approved for NASAL  specimens only), is one component of a comprehensive MRSA colonization surveillance program. It is not intended to diagnose MRSA infection nor to guide or monitor treatment for MRSA infections.           IMAGING    No results found.  ASSESSMENT/PLAN   71 year old lady with past medical history significant for COPD, stage III lung cancer, ongoing tobacco abuse, dyslipidemia, breast carcinoma who presented to the hospital with one-week history of weakness and just not feeling well. The patient was found to be neutropenic and hypotensive at the time of presentation.  She received 2 L of IV fluids in the emergency room and was admitted to the ICU for closer monitoring and further care.  Problem list  Neutropenic sepsis Severe sepsis  Lactic acidosis Thrombocytopenia-1 week post chemotherapy Chronic anemia COPD Ongoing tobacco abuse-half a pack of cigarettes a day Dyslipidemia History of breast cancer History of stage III lung cancer-currently on chemotherapy and radiation therapy  Continue current aggressive care. Continue IV fluids.  Closely monitor blood pressure.  If becomes hypotensive despite IV fluid resuscitation, will need vasopressors. Follow cultures.  Continue vancomycin and Zosyn. Lactic acidosis resolved   Continue Granix. Neutropenia, thrombocytopenia and anemia is likely secondary to chemotherapy. Sepsis is also likely contributing to it. Hemoglobin drop noted earlier.  No overt bleeding. Check stool for Hemoccult. Patient received PRBC transfusion.  Hemoglobin have been stable during the day. Monitor daily hemoglobin.  Transfuse for hemoglobin less than 7 and platelets for less than platelet count of 10,000.  Continue Dulera. Albuterol PRN.  Continue statins.  SCD for DVT prophylaxis given the severity of thrombocytopenia.  DNR/DNI.  Critical Care time 55 minutes.  Nettie Elm, M.D.  Pulmonary & Critical Care Medicine

## 2017-04-30 ENCOUNTER — Ambulatory Visit: Payer: Medicare Other

## 2017-04-30 ENCOUNTER — Other Ambulatory Visit: Payer: Self-pay

## 2017-04-30 ENCOUNTER — Inpatient Hospital Stay: Payer: Medicare Other

## 2017-04-30 DIAGNOSIS — E876 Hypokalemia: Secondary | ICD-10-CM

## 2017-04-30 LAB — BASIC METABOLIC PANEL
ANION GAP: 7 (ref 5–15)
BUN: 5 mg/dL — AB (ref 6–20)
CALCIUM: 7.4 mg/dL — AB (ref 8.9–10.3)
CO2: 26 mmol/L (ref 22–32)
Chloride: 105 mmol/L (ref 101–111)
Creatinine, Ser: 0.65 mg/dL (ref 0.44–1.00)
GFR calc Af Amer: >60 mL/min (ref >60)
GLUCOSE: 99 mg/dL (ref 65–99)
POTASSIUM: 3.4 mmol/L — AB (ref 3.5–5.1)
SODIUM: 138 mmol/L (ref 135–145)

## 2017-04-30 LAB — CBC WITH DIFFERENTIAL/PLATELET
Basophils Absolute: 0 10*3/uL (ref 0–0.1)
Basophils Relative: 0 %
EOS PCT: 2 %
Eosinophils Absolute: 0.1 10*3/uL (ref 0–0.7)
HCT: 26.9 % — ABNORMAL LOW (ref 35.0–47.0)
HEMOGLOBIN: 9.1 g/dL — AB (ref 12.0–16.0)
LYMPHS PCT: 14 %
Lymphs Abs: 0.4 10*3/uL — ABNORMAL LOW (ref 1.0–3.6)
MCH: 29.2 pg (ref 26.0–34.0)
MCHC: 33.7 g/dL (ref 32.0–36.0)
MCV: 86.7 fL (ref 80.0–100.0)
MONOS PCT: 26 %
Monocytes Absolute: 0.7 10*3/uL (ref 0.2–0.9)
NEUTROS ABS: 1.4 10*3/uL (ref 1.4–6.5)
NRBC: 1 /100{WBCs} — AB
Neutrophils Relative %: 58 %
Platelets: 35 10*3/uL — ABNORMAL LOW (ref 150–440)
RBC: 3.11 MIL/uL — ABNORMAL LOW (ref 3.80–5.20)
RDW: 16.1 % — ABNORMAL HIGH (ref 11.5–14.5)
WBC MORPHOLOGY: INCREASED
WBC: 2.6 10*3/uL — ABNORMAL LOW (ref 3.6–11.0)

## 2017-04-30 LAB — MAGNESIUM
MAGNESIUM: 1.2 mg/dL — AB (ref 1.7–2.4)
MAGNESIUM: 2.1 mg/dL (ref 1.7–2.4)

## 2017-04-30 LAB — COMPREHENSIVE METABOLIC PANEL
ALBUMIN: 2 g/dL — AB (ref 3.5–5.0)
ALK PHOS: 84 U/L (ref 38–126)
ALT: 13 U/L — AB (ref 14–54)
AST: 17 U/L (ref 15–41)
Anion gap: 7 (ref 5–15)
BUN: 6 mg/dL (ref 6–20)
CALCIUM: 7.2 mg/dL — AB (ref 8.9–10.3)
CO2: 24 mmol/L (ref 22–32)
CREATININE: 0.63 mg/dL (ref 0.44–1.00)
Chloride: 106 mmol/L (ref 101–111)
GFR calc non Af Amer: >60 mL/min (ref >60)
GLUCOSE: 80 mg/dL (ref 65–99)
Potassium: 2.9 mmol/L — ABNORMAL LOW (ref 3.5–5.1)
SODIUM: 137 mmol/L (ref 135–145)
Total Bilirubin: 0.9 mg/dL (ref 0.3–1.2)
Total Protein: 4.8 g/dL — ABNORMAL LOW (ref 6.5–8.1)

## 2017-04-30 LAB — BPAM RBC
Blood Product Expiration Date: 201812232359
ISSUE DATE / TIME: 201812020131
Unit Type and Rh: 600

## 2017-04-30 LAB — TYPE AND SCREEN
ABO/RH(D): A NEG
ANTIBODY SCREEN: NEGATIVE
UNIT DIVISION: 0

## 2017-04-30 LAB — PROTIME-INR
INR: 1.14
Prothrombin Time: 14.5 seconds (ref 11.4–15.2)

## 2017-04-30 LAB — PHOSPHORUS
PHOSPHORUS: 3.2 mg/dL (ref 2.5–4.6)
Phosphorus: 2 mg/dL — ABNORMAL LOW (ref 2.5–4.6)

## 2017-04-30 LAB — URINE CULTURE: CULTURE: NO GROWTH

## 2017-04-30 MED ORDER — POTASSIUM CHLORIDE 20 MEQ PO PACK
40.0000 meq | PACK | Freq: Once | ORAL | Status: AC
  Administered 2017-04-30: 40 meq via ORAL
  Filled 2017-04-30: qty 2

## 2017-04-30 MED ORDER — SODIUM CHLORIDE 0.9 % IV BOLUS (SEPSIS)
500.0000 mL | Freq: Once | INTRAVENOUS | Status: AC
  Administered 2017-04-30: 500 mL via INTRAVENOUS

## 2017-04-30 MED ORDER — ENSURE ENLIVE PO LIQD
237.0000 mL | Freq: Three times a day (TID) | ORAL | Status: DC
  Administered 2017-04-30 – 2017-05-02 (×4): 237 mL via ORAL

## 2017-04-30 MED ORDER — ADULT MULTIVITAMIN W/MINERALS CH
1.0000 | ORAL_TABLET | Freq: Every day | ORAL | Status: DC
  Administered 2017-05-01 – 2017-05-02 (×2): 1 via ORAL
  Filled 2017-04-30 (×2): qty 1

## 2017-04-30 MED ORDER — VITAMIN B-12 1000 MCG PO TABS
1000.0000 ug | ORAL_TABLET | Freq: Every day | ORAL | Status: DC
  Administered 2017-04-30 – 2017-05-02 (×3): 1000 ug via ORAL
  Filled 2017-04-30 (×3): qty 1

## 2017-04-30 MED ORDER — POTASSIUM PHOSPHATES 15 MMOLE/5ML IV SOLN
40.0000 mmol | Freq: Once | INTRAVENOUS | Status: AC
  Administered 2017-04-30: 40 mmol via INTRAVENOUS
  Filled 2017-04-30: qty 13.33

## 2017-04-30 MED ORDER — MAGNESIUM SULFATE 4 GM/100ML IV SOLN
4.0000 g | Freq: Once | INTRAVENOUS | Status: AC
  Administered 2017-04-30: 4 g via INTRAVENOUS
  Filled 2017-04-30: qty 100

## 2017-04-30 MED ORDER — FILGRASTIM 480 MCG/1.6ML IJ SOLN
480.0000 ug | Freq: Every day | INTRAMUSCULAR | Status: DC
  Administered 2017-04-30: 480 ug via SUBCUTANEOUS
  Filled 2017-04-30 (×2): qty 1.6

## 2017-04-30 NOTE — Progress Notes (Signed)
[facility] [place] NAME: Roberta [name]    MR#:  [id number]  DATE OF BIRTH:  12-Apr-1946  SUBJECTIVE:  CHIEF COMPLAINT:   Chief Complaint  Patient presents with  . Fever   The patient feels better, hypotension improved but BP is at low side. REVIEW OF SYSTEMS:  Review of Systems  Constitutional: Positive for malaise/fatigue. Negative for chills and fever.  HENT: Negative for sore throat.   Eyes: Negative for blurred vision and double vision.  Respiratory: Negative for cough, hemoptysis, shortness of breath, wheezing and stridor.   Cardiovascular: Negative for chest pain, palpitations, orthopnea and leg swelling.  Gastrointestinal: Negative for abdominal pain, blood in stool, diarrhea, melena, nausea and vomiting.  Genitourinary: Negative for dysuria, flank pain and hematuria.  Musculoskeletal: Negative for back pain and joint pain.  Skin: Negative for rash.  Neurological: Positive for weakness. Negative for dizziness, sensory change, focal weakness, seizures, loss of consciousness and headaches.  Endo/Heme/Allergies: Negative for polydipsia.  Psychiatric/Behavioral: Negative for depression. The patient is not nervous/anxious.     DRUG ALLERGIES:  No Known Allergies VITALS:  Blood pressure 100/65, pulse 95, temperature 97.8 F (36.6 C), temperature source Oral, resp. rate (!) 23, height '5\' 3"'  (1.6 m), weight 131 lb 6.3 oz (59.6 kg), SpO2 98 %. PHYSICAL EXAMINATION:  Physical Exam  Constitutional: She is oriented to person, place, and time and well-developed, well-nourished, and in no distress.  HENT:  Head: Normocephalic.  Mouth/Throat: Oropharynx is clear and moist.  Eyes: Conjunctivae and EOM are normal. Pupils are equal, round, and reactive to light. No scleral icterus.  Neck: Normal range of motion. Neck supple. No JVD present. No tracheal deviation present.  Cardiovascular: Normal rate, regular rhythm and normal heart  sounds. Exam reveals no gallop.  No murmur heard. Pulmonary/Chest: Effort normal and breath sounds normal. No respiratory distress. She has no wheezes. She has no rales.  Abdominal: Soft. Bowel sounds are normal. She exhibits no distension. There is no tenderness. There is no rebound.  Musculoskeletal: Normal range of motion. She exhibits no edema or tenderness.  Neurological: She is alert and oriented to person, place, and time. No cranial nerve deficit.  Skin: No rash noted. No erythema.  Psychiatric: Affect normal.   LABORATORY PANEL:  Female CBC Recent Labs  Lab 04/30/17 0548  WBC 2.6*  HGB 9.1*  HCT 26.9*  PLT 35*   ------------------------------------------------------------------------------------------------------------------ Chemistries  Recent Labs  Lab 04/30/17 0548  NA 137  K 2.9*  CL 106  CO2 24  GLUCOSE 80  BUN 6  CREATININE 0.63  CALCIUM 7.2*  MG 1.2*  AST 17  ALT 13*  ALKPHOS 84  BILITOT 0.9   RADIOLOGY:  No results found. ASSESSMENT AND PLAN:   Roberta [name]  is a 71 y.o. female with a known history of stage III small cell lung cancer, recent chemotherapy and radiation therapy, chronic history of COPD, hyperlipidemia, AAA status post stent, still continues to smoke, recently used Augmentin a few days ago for possible pneumonia is presenting to ED with a chief complaint of almost passing out spell and feeling weak and terrible. Patient was found to be hypotensive tachycardic and lactic acid 3.  # Sepsis with severe hypotension Met septic criteria with hypotension, tachycardia. Elevated lactic acid continue IV fluid, zosyn and discontinued vancomycin Follow-up blood cultures, urinalysis, urine cultures and sputum culture and sensitivity Chest x-ray negative   #Neutropenic fever with recent history of chemotherapy, improving. She  would need antibiotics at least for 7-10 days especially since she is neutropenic.  Continue Neupogen 480 mcg daily until  [place] greater than 3 per Dr. Janese Banks. Neutropenic precautions.  #Stage III small cell lung cancer status post chemotherapy and radiation therapy.   #Thrombocytopenia No active bleeding or bruising Per Dr. Janese Banks, Thrombocytopenia secondary to chemotherapy continue to monitor no indication for platelet transfusion unless platelet count less than 10 or there is evidence of clinical bleeding.  Her platelet counts showed normalize in the next week or 10 days.  Anemia of chronic disease.   hemoglobin is up to 9.1 after 2 unit PRBC transfusion.  #Chronic history of COPD he still continues to smoke Bronchodilator therapy  #Tobacco abuse disorder Consult patient to quit smoking, on nicotine patch  Hypokalemia.  Given potassium supplement and follow level. Hypomagnesemia.  Improved with IV magnesium. Hypophosphatemia.  Given phosphorus and follow-up level. Hyponatremia.  Continue normal saline IV and follow-up BMP.  All the records are reviewed and case discussed with Care Management/Social Worker. Management plans discussed with the patient, her son and daughter, and they are in agreement.  CODE STATUS: DNR  TOTAL TIME TAKING CARE OF THIS PATIENT: 37 minutes.   More than 50% of the time was spent in counseling/coordination of care: YES  POSSIBLE D/C IN 3 DAYS, DEPENDING ON CLINICAL CONDITION.   Demetrios Loll M.D on 04/30/2017 at 2:29 PM  Between 7am to 6pm - Pager - [phone]  After 6pm go to [email or web address] - [facility]

## 2017-04-30 NOTE — Progress Notes (Signed)
Initial Nutrition Assessment  DOCUMENTATION CODES:   Severe malnutrition in context of acute illness/injury  INTERVENTION:  Diet was liberalized to regular today during rounds.  Provide Ensure Enlive po TID, each supplement provides 350 kcal and 20 grams of protein.  Provide MVI daily.  Encouraged intake of small, frequent meals throughout the day. Encouraged patient to eat calorie- and protein-dense foods to make the most of each bite.  Patient would benefit from being followed by outpatient RD at cancer center in setting of malnutrition. Will coordinate care.  NUTRITION DIAGNOSIS:   Severe Malnutrition related to acute illness(stage III small cell lung cancer s/p chemotherapy/XRT) as evidenced by 9.9% weight loss over 2.5 months, moderate fat depletion, mild muscle depletion, moderate muscle depletion.  GOAL:   Patient will meet greater than or equal to 90% of their needs  MONITOR:   PO intake, Supplement acceptance, Labs, Weight trends, I & O's  REASON FOR ASSESSMENT:   Rounds    ASSESSMENT:   71 year old female with PMHx of COPD, left breast cancer s/p mastectomy in 2002, AAA s/p stent, stage III small cell lung cancer s/p 3 cycles of cisplatin and etoposide with concurrent XRT who presented with one weak history of weakness found to have neutropenic sepsis, thrombocytopenia.   Met with patient at bedside. She reports she has had a poor appetite since she started treatment in September. She is reporting absence of hunger (anorexia). She initially had N/V after her first cycle of chemotherapy but reports that has improved now. Denies constipation/diarrhea (though noted documented BM today is large type 7 after RD assessment), or difficulty chewing (even with poor dentition noted on exam). She does endorse some pain with swallowing in her chest, but reports the medication they are giving her is helping (nystatin). She typically eats 3 meals per day. Breakfast is eggs and toast  or grits. Lunch is a sandwich or soup. Dinner is a meat with vegetables. She occasionally has a bedtime snack. She used to be able to finish most of her meals, but since starting treatment she is not finishing as much (though unable to describe exactly how much she is eating). She has not previously tried Ensure but she is open to trying it to help meet her calorie/protein needs.  Patient reports she is unsure of her UBW but she knows she has been losing weight. Per chart patient was 145.9 lbs on 02/12/2017. She has lost 14.5 lbs (9.9% body weight) over 2.5 months, which is significant for time frame.  Medications reviewed and include: Colace, nystatin 5 mL QID, Carafate 1 gram TID and QHS, vitamin B12 1000 micrograms daily, NS @ 125 mL/hr, Zosyn, potassium phosphate 40 mmol once IV today.  Labs reviewed: Potassium 2.9, Phosphorus 2, Magnesium 1.2. On 12/2 Iron 11, TIBC 141, Ferritin 334, folate WNL, vitamin B12 WNL (but on lower end of normal at 247).  Discussed with RN and also discussed on rounds. Patient has not been eating very well.  NUTRITION - FOCUSED PHYSICAL EXAM:    Most Recent Value  Orbital Region  Moderate depletion  Upper Arm Region  Moderate depletion  Thoracic and Lumbar Region  Mild depletion  Buccal Region  Moderate depletion  Temple Region  Moderate depletion  Clavicle Bone Region  Moderate depletion  Clavicle and Acromion Bone Region  Moderate depletion  Scapular Bone Region  Mild depletion  Dorsal Hand  Mild depletion  Patellar Region  Moderate depletion  Anterior Thigh Region  Moderate depletion  Posterior  Calf Region  Moderate depletion  Edema (RD Assessment)  None  Hair  Reviewed  Eyes  Reviewed  Mouth  Reviewed  Skin  Reviewed  Nails  Reviewed     Diet Order:  Diet regular Room service appropriate? Yes; Fluid consistency: Thin  EDUCATION NEEDS:   Education needs have been addressed  Skin:  Skin Assessment: Reviewed RN Assessment  Last BM:  04/30/2017 -  large type 7  Height:   Ht Readings from Last 1 Encounters:  04/28/17 '5\' 3"'  (1.6 m)    Weight:   Wt Readings from Last 1 Encounters:  04/28/17 131 lb 6.3 oz (59.6 kg)    Ideal Body Weight:  52.3 kg  BMI:  Body mass index is 23.28 kg/m.  Estimated Nutritional Needs:   Kcal:  1500-1800 (25-30 kcal/kg)  Protein:  72-90 grams (1.2-1.5 grams/kg)  Fluid:  1.5-1.8 L/day (25-30 mL/kg)  Willey Blade, MS, RD, LDN Office: [phone] Pager: [phone] After Hours/Weekend Pager: [phone]

## 2017-04-30 NOTE — Care Management Note (Signed)
Case Management Note  Patient Details  Name: Roberta [name] MRN: [id number] Date of Birth: 05-Aug-1945  Subjective/Objective:                 Patient admitted from home to icu for sepsis and hypotension. Current oxygen requirement is acute.  Currently being treated for stage 3 lung cancer with chemo and radiation. Does not have any services in the home at present. No issues accessing medical care identified   Action/Plan:   Expected Discharge Date:                  Expected Discharge Plan:     In-House Referral:     Discharge planning Services     Post Acute Care Choice:    Choice offered to:     DME Arranged:    DME Agency:     HH Arranged:    HH Agency:     Status of Service:     If discussed at [employer], dates discussed:    Additional Comments:  Katrina Stack, RN 04/30/2017, 4:42 PM

## 2017-04-30 NOTE — Progress Notes (Signed)
[facility] Consultation     Date: 04/30/2017,   MRN# [id number] Roberta [name] Aug 17, 1945 Code Status:     Code Status Orders  (From admission, onward)        Start     Ordered   04/28/17 1147  Do not attempt resuscitation (DNR)  Continuous    Question Answer Comment  In the event of cardiac or respiratory ARREST Do not call a "code blue"   In the event of cardiac or respiratory ARREST Do not perform Intubation, CPR, defibrillation or ACLS   In the event of cardiac or respiratory ARREST Use medication by any route, position, wound care, and other measures to relive pain and suffering. May use oxygen, suction and manual treatment of airway obstruction as needed for comfort.   Comments RN may pronounce      04/28/17 1146    Code Status History    Date Active Date Inactive Code Status Order ID Comments User Context   12/06/2016 11:17 12/07/2016 15:47 Full Code [id number]  Algernon Huxley, MD Inpatient     Hosp day:@LENGTHOFSTAYDAYS @ Referring MD: @ATDPROV @     PCP:      AdmissionWeight: 59 kg (130 lb)                 CurrentWeight: 59.6 kg (131 lb 6.3 oz) Roberta [name] is a 71 y.o. old female    SUBJECTIVE:  dorse le No acute issues overnight   MEDICATIONS    Home Medication:    Current Medication:   Current Facility-Administered Medications:  .  0.9 %  sodium chloride infusion, , Intravenous, Continuous, Gouru, Aruna, MD, Last Rate: 125 mL/hr at 04/30/17 0400 .  albuterol (PROVENTIL) (2.5 MG/3ML) 0.083% nebulizer solution 2.5 mg, 2.5 mg, Nebulization, Q6H PRN, Gouru, Aruna, MD .  atorvastatin (LIPITOR) tablet 10 mg, 10 mg, Oral, QHS, Gouru, Aruna, MD, 10 mg at 04/29/17 2223 .  docusate sodium (COLACE) capsule 100 mg, 100 mg, Oral, BID, Gouru, Aruna, MD, 100 mg at 04/30/17 0932 .  filgrastim (NEUPOGEN) injection 480 mcg, 480 mcg, Subcutaneous, Daily, Sindy Guadeloupe, MD .  guaifenesin (ROBITUSSIN) 100 MG/5ML syrup 200 mg, 200 mg, Oral, TID  PRN, Dorene Sorrow S, NP, 200 mg at 04/29/17 2353 .  lidocaine (XYLOCAINE) 2 % jelly 1 application, 1 application, Other, Once, Darel Hong, MD, Stopped at 04/28/17 1507 .  LORazepam (ATIVAN) tablet 0.5 mg, 0.5 mg, Oral, Q6H PRN, Gouru, Aruna, MD .  MEDLINE mouth rinse, 15 mL, Mouth Rinse, BID, Nettie Elm, MD, 15 mL at 04/30/17 0944 .  mometasone-formoterol (DULERA) 200-5 MCG/ACT inhaler 2 puff, 2 puff, Inhalation, BID, Gouru, Aruna, MD, 2 puff at 04/30/17 0805 .  nicotine (NICODERM CQ - dosed in mg/24 hours) patch 14 mg, 14 mg, Transdermal, Daily, Gouru, Aruna, MD, 14 mg at 04/30/17 0944 .  norepinephrine (LEVOPHED) 4 mg in dextrose 5 % 250 mL (0.016 mg/mL) infusion, 0-40 mcg/min, Intravenous, Once, Darel Hong, MD, Stopped at 04/28/17 1508 .  nystatin (MYCOSTATIN) 100000 UNIT/ML suspension 500,000 Units, 5 mL, Mouth/Throat, QID, Gouru, Aruna, MD, 500,000 Units at 04/30/17 0933 .  ondansetron (ZOFRAN) tablet 4 mg, 4 mg, Oral, Q6H PRN, Gouru, Aruna, MD .  piperacillin-tazobactam (ZOSYN) IVPB 3.375 g, 3.375 g, Intravenous, Q8H, Ramond Dial, RPH, Stopped at 04/30/17 0907 .  potassium PHOSPHATE 40 mmol in dextrose 5 % 500 mL infusion, 40 mmol, Intravenous, Once, Tukov, Magadalene S, NP, Last Rate: 86 mL/hr at 04/30/17 0928, 40 mmol at 04/30/17  4098 .  sucralfate (CARAFATE) tablet 1 g, 1 g, Oral, TID WC & HS, Gouru, Aruna, MD, 1 g at 04/30/17 0804 .  vitamin B-12 (CYANOCOBALAMIN) tablet 1,000 mcg, 1,000 mcg, Oral, Daily, Sindy Guadeloupe, MD  Facility-Administered Medications Ordered in Other Encounters:  .  sodium chloride flush (NS) 0.9 % injection 10 mL, 10 mL, Intravenous, PRN, Sindy Guadeloupe, MD, 10 mL at 03/26/17 0948 .  sodium chloride flush (NS) 0.9 % injection 10 mL, 10 mL, Intravenous, PRN, Sindy Guadeloupe, MD, 10 mL at 04/16/17 1006     REVIEW OF SYSTEMS    VS: BP (!) 80/56   Pulse 94   Temp 98 F (36.7 C) (Axillary)   Resp 18   Ht 5\' 3"  (1.6 m)   Wt 59.6 kg (131  lb 6.3 oz)   SpO2 97%   BMI 23.28 kg/m      PHYSICAL EXAM   Awake, alert, oriented.  No apparent distress. Sclera anicteric. Oral mucosa moist. CVS: s1s2, rrr, no M/R/G Chest clear to auscultation bilaterally with no rales rhonchi or wheezes, even, non labored  Abdomen is soft, nontender, nondistended. No lower extremity edema bilaterally Scattered bilateral lower extremity abrasions   LABS    Recent Labs    04/28/17 1002 04/28/17 1556 04/28/17 2154 04/29/17 0539 04/29/17 1435 04/30/17 0548  HGB 9.0* 7.8* 7.0* 8.3* 8.1* 9.1*  HCT 27.8* 23.5* 21.1* 25.5* 24.3* 26.9*  MCV 86.2 86.1  --  86.2  --  86.7  WBC 0.3* 0.2*  --  0.5*  --  2.6*  BUN 16 14  --  9  --  6  CREATININE 0.65 0.65  --  0.77  --  0.63  GLUCOSE 116* 125*  --  87  --  80  CALCIUM 8.4* 7.0*  --  6.9*  --  7.2*  INR 1.00 1.10  --  1.14  --  1.14  ,    No results for input(s): PH in the last 72 hours.  Invalid input(s): PCO2, PO2, BASEEXCESS, BASEDEFICITE, TFT    CULTURE RESULTS   Recent Results (from the past 240 hour(s))  Blood Culture (routine x 2)     Status: None (Preliminary result)   Collection Time: 04/28/17 10:02 AM  Result Value Ref Range Status   Specimen Description BLOOD BLOOD RIGHT HAND  Final   Special Requests   Final    BOTTLES DRAWN AEROBIC AND ANAEROBIC Blood Culture results may not be optimal due to an excessive volume of blood received in culture bottles   Culture NO GROWTH 2 DAYS  Final   Report Status PENDING  Incomplete  Blood Culture (routine x 2)     Status: None (Preliminary result)   Collection Time: 04/28/17 10:09 AM  Result Value Ref Range Status   Specimen Description BLOOD BLOOD RIGHT WRIST  Final   Special Requests   Final    BOTTLES DRAWN AEROBIC AND ANAEROBIC Blood Culture adequate volume   Culture NO GROWTH 2 DAYS  Final   Report Status PENDING  Incomplete  MRSA PCR Screening     Status: None   Collection Time: 04/28/17  3:52 PM  Result Value Ref Range  Status   MRSA by PCR NEGATIVE NEGATIVE Final    Comment:        The GeneXpert MRSA Assay (FDA approved for NASAL specimens only), is one component of a comprehensive MRSA colonization surveillance program. It is not intended to diagnose MRSA infection nor to guide  or monitor treatment for MRSA infections.   Urine culture     Status: None   Collection Time: 04/28/17  8:34 PM  Result Value Ref Range Status   Specimen Description URINE, RANDOM  Final   Special Requests NONE  Final   Culture   Final    NO GROWTH Performed at [facility], [address] [place], [place] [address]    Report Status 04/30/2017 FINAL  Final          IMAGING    No results found.       ASSESSMENT/PLAN   70 year old lady with past medical history significant for COPD, stage III lung cancer, ongoing tobacco abuse, dyslipidemia, breast carcinoma who presented to the hospital with one-week history of weakness and just not feeling well. The patient was found to be neutropenic and hypotensive at the time of presentation.  She received 2 L of IV fluids in the emergency room and was admitted to the ICU for closer monitoring and further care.  Problem list Neutropenic sepsis Severe sepsis  Lactic acidosis Thrombocytopenia-1 week post chemotherapy Chronic anemia COPD Ongoing tobacco abuse-half a pack of cigarettes a day Dyslipidemia History of breast cancer History of stage III lung cancer-currently on chemotherapy and radiation therapy  P: Continue current aggressive care. Continue IV fluids.  Closely monitor blood pressure.  If becomes hypotensive despite IV fluid resuscitation, will need vasopressors. Follow cultures.   Discontinued Vancomycin and continue zosyn Lactic acidosis resolved  Continue Granix. Neutropenia, thrombocytopenia and anemia is likely secondary to chemotherapy. Sepsis is also likely contributing to it. Hemoglobin drop noted earlier.  No overt  bleeding. Check stool for Hemoccult. Hemoglobin currently stable  Monitor daily hemoglobin. Transfuse for hemoglobin less than 7 and platelets for less than platelet count of 10,000.  Continue Dulera. Albuterol PRN.  Continue statins.  SCD for DVT prophylaxis given the severity of thrombocytopenia.  DNR/DNI.  Marda Stalker, [place] Pager [phone] (please enter 7 digits) PCCM Consult Pager [phone] (please enter 7 digits)

## 2017-04-30 NOTE — Progress Notes (Signed)
Patient alert and able to make needs known. Patient denies pain and slept off and on throughout the night. Vitals maintained throughout the shift and patient remains on 3L O2 via [place].

## 2017-04-30 NOTE — Progress Notes (Signed)
Hematology/Oncology Consult note [facility]  [phone] [phone]  Patient Care Team: Gayland Curry, MD as PCP - General (Family Medicine) Gayland Curry, MD (Family Medicine) Telford Nab, RN as Registered Nurse   Name of the patient: Roberta [name]  [id number]  70-Nov-1947   Date of visit: 04/30/2017  Diagnosis-limited stage small cell lung cancer status post 3 cycles of cisplatin and etoposide and concurrent radiation   Interval history-patient feels better since admission.  She still feels tired and continues to have dry nonproductive cough which has continued since diagnosis.  She has tried Robitussin, Best boy and Hycodan cough syrup without any benefit.  No fever over the last 48 hours.  Appetite is improving.  ECOG PS- 2 Pain scale- 0  Review of systems- Review of Systems  Constitutional: Positive for malaise/fatigue. Negative for chills, fever and weight loss.  HENT: Negative for congestion, ear discharge and nosebleeds.   Eyes: Negative for blurred vision.  Respiratory: Positive for cough. Negative for hemoptysis, sputum production, shortness of breath and wheezing.   Cardiovascular: Negative for chest pain, palpitations, orthopnea and claudication.  Gastrointestinal: Negative for abdominal pain, blood in stool, constipation, diarrhea, heartburn, melena, nausea and vomiting.  Genitourinary: Negative for dysuria, flank pain, frequency, hematuria and urgency.  Musculoskeletal: Negative for back pain, joint pain and myalgias.  Skin: Negative for rash.  Neurological: Positive for weakness. Negative for dizziness, tingling, focal weakness, seizures and headaches.  Endo/Heme/Allergies: Does not bruise/bleed easily.  Psychiatric/Behavioral: Negative for depression and suicidal ideas. The patient does not have insomnia.      No Known Allergies   Past Medical History:  Diagnosis Date  . AAA (abdominal aortic  aneurysm) ([place])   . Breast cancer ([place]) 2002   left  . COPD (chronic obstructive pulmonary disease) ([place])   . High cholesterol   . Personal history of chemotherapy   . Small cell lung cancer ([place])   . Tobacco abuse      Past Surgical History:  Procedure Laterality Date  . ABDOMINAL HYSTERECTOMY    . ENDOBRONCHIAL ULTRASOUND N/A 02/19/2017   Procedure: ENDOBRONCHIAL ULTRASOUND;  Surgeon: Flora Lipps, MD;  Location: [facility] ORS;  Service: Cardiopulmonary;  Laterality: N/A;  . ENDOVASCULAR REPAIR/STENT GRAFT N/A 12/06/2016   Procedure: Endovascular Repair/Stent Graft;  Surgeon: Katha Cabal, MD;  Location: [facility];  Service: Cardiovascular;  Laterality: N/A;  . IR FLUORO GUIDE PORT INSERTION RIGHT  03/02/2017  . MASTECTOMY Left 2002   with sentinel node     Social History   Socioeconomic History  . Marital status: Widowed    Spouse name: Not on file  . Number of children: Not on file  . Years of education: Not on file  . Highest education level: Not on file  Social Needs  . Financial resource strain: Not on file  . Food insecurity - worry: Not on file  . Food insecurity - inability: Not on file  . Transportation needs - medical: Not on file  . Transportation needs - non-medical: Not on file  Occupational History  . Not on file  Tobacco Use  . Smoking status: Current Every Day Smoker    Packs/day: 0.50    Types: Cigarettes  . Smokeless tobacco: Never Used  Substance and Sexual Activity  . Alcohol use: No  . Drug use: No  . Sexual activity: No  Other Topics Concern  . Not on file  Social History Narrative  . Not on file  Family History  Problem Relation Age of Onset  . Breast cancer Neg Hx      Current Facility-Administered Medications:  .  0.9 %  sodium chloride infusion, , Intravenous, Continuous, Gouru, Aruna, MD, Last Rate: 125 mL/hr at 04/30/17 0400 .  albuterol (PROVENTIL) (2.5 MG/3ML) 0.083% nebulizer solution 2.5 mg, 2.5 mg,  Nebulization, Q6H PRN, Gouru, Aruna, MD .  atorvastatin (LIPITOR) tablet 10 mg, 10 mg, Oral, QHS, Gouru, Aruna, MD, 10 mg at 04/29/17 2223 .  docusate sodium (COLACE) capsule 100 mg, 100 mg, Oral, BID, Gouru, Aruna, MD, 100 mg at 04/29/17 2223 .  guaifenesin (ROBITUSSIN) 100 MG/5ML syrup 200 mg, 200 mg, Oral, TID PRN, Dorene Sorrow S, NP, 200 mg at 04/29/17 2353 .  lidocaine (XYLOCAINE) 2 % jelly 1 application, 1 application, Other, Once, Darel Hong, MD, Stopped at 04/28/17 1507 .  LORazepam (ATIVAN) tablet 0.5 mg, 0.5 mg, Oral, Q6H PRN, Gouru, Aruna, MD .  magnesium sulfate IVPB 4 g 100 mL, 4 g, Intravenous, Once, Tukov, Magadalene S, NP, Last Rate: 50 mL/hr at 04/30/17 0805, 4 g at 04/30/17 0805 .  MEDLINE mouth rinse, 15 mL, Mouth Rinse, BID, Nettie Elm, MD, 15 mL at 04/29/17 2221 .  mometasone-formoterol (DULERA) 200-5 MCG/ACT inhaler 2 puff, 2 puff, Inhalation, BID, Gouru, Aruna, MD, 2 puff at 04/30/17 0805 .  nicotine (NICODERM CQ - dosed in mg/24 hours) patch 14 mg, 14 mg, Transdermal, Daily, Gouru, Aruna, MD, 14 mg at 04/29/17 1000 .  norepinephrine (LEVOPHED) 4 mg in dextrose 5 % 250 mL (0.016 mg/mL) infusion, 0-40 mcg/min, Intravenous, Once, Darel Hong, MD, Stopped at 04/28/17 1508 .  nystatin (MYCOSTATIN) 100000 UNIT/ML suspension 500,000 Units, 5 mL, Mouth/Throat, QID, Gouru, Aruna, MD, 500,000 Units at 04/29/17 2223 .  ondansetron (ZOFRAN) tablet 4 mg, 4 mg, Oral, Q6H PRN, Gouru, Aruna, MD .  piperacillin-tazobactam (ZOSYN) IVPB 3.375 g, 3.375 g, Intravenous, Q8H, Maccia, Melissa D, RPH, Last Rate: 12.5 mL/hr at 04/30/17 0501, 3.375 g at 04/30/17 0501 .  potassium PHOSPHATE 40 mmol in dextrose 5 % 500 mL infusion, 40 mmol, Intravenous, Once, Tukov, Magadalene S, NP .  sucralfate (CARAFATE) tablet 1 g, 1 g, Oral, TID WC & HS, Gouru, Aruna, MD, 1 g at 04/30/17 9381  Facility-Administered Medications Ordered in Other Encounters:  .  sodium chloride flush (NS) 0.9 %  injection 10 mL, 10 mL, Intravenous, PRN, Sindy Guadeloupe, MD, 10 mL at 03/26/17 0948 .  sodium chloride flush (NS) 0.9 % injection 10 mL, 10 mL, Intravenous, PRN, Sindy Guadeloupe, MD, 10 mL at 04/16/17 1006  Physical exam:  Vitals:   04/30/17 0200 04/30/17 0300 04/30/17 0400 04/30/17 0500  BP: (!) 110/59 111/60 109/60 105/64  Pulse: (!) 103 98 98 (!) 118  Resp: (!) 23 (!) 22 (!) 21 (!) 22  Temp:   98 F (36.7 C)   TempSrc:   Axillary   SpO2: 93% 95% 98% 96%  Weight:      Height:       Physical Exam  Constitutional: She is oriented to person, place, and time.  Thin woman who does not appear to be in any acute distress  HENT:  Head: Normocephalic and atraumatic.  Eyes: EOM are normal. Pupils are equal, round, and reactive to light.  Neck: Normal range of motion.  Cardiovascular: Regular rhythm and normal heart sounds.  Tachycardic  Pulmonary/Chest: Effort normal and breath sounds normal.  Abdominal: Soft. Bowel sounds are normal.  Neurological: She is alert and  oriented to person, place, and time.  Skin: Skin is warm and dry.     CMP Latest Ref Rng & Units 04/30/2017  Glucose 65 - 99 mg/dL 80  BUN 6 - 20 mg/dL 6  Creatinine 0.44 - 1.00 mg/dL 0.63  Sodium 135 - 145 mmol/L 137  Potassium 3.5 - 5.1 mmol/L 2.9(L)  Chloride 101 - 111 mmol/L 106  CO2 22 - 32 mmol/L 24  Calcium 8.9 - 10.3 mg/dL 7.2(L)  Total Protein 6.5 - 8.1 g/dL 4.8(L)  Total Bilirubin 0.3 - 1.2 mg/dL 0.9  Alkaline Phos 38 - 126 U/L 84  AST 15 - 41 U/L 17  ALT 14 - 54 U/L 13(L)   CBC Latest Ref Rng & Units 04/30/2017  WBC 3.6 - 11.0 K/uL 2.6(L)  Hemoglobin 12.0 - 16.0 g/dL 9.1(L)  Hematocrit 35.0 - 47.0 % 26.9(L)  Platelets 150 - 440 K/uL 35(L)    @IMAGES @  Ct Chest W Contrast  Result Date: 04/06/2017 CLINICAL DATA:  Restaging of right upper lobe small cell lung cancer, initial diagnosis September 2018, status post chemotherapy and radiation therapy. EXAM: CT CHEST, ABDOMEN, AND PELVIS WITH CONTRAST  TECHNIQUE: Multidetector CT imaging of the chest, abdomen and pelvis was performed following the standard protocol during bolus administration of intravenous contrast. CONTRAST:  14mL ISOVUE-300 IOPAMIDOL (ISOVUE-300) INJECTION 61% COMPARISON:  Multiple exams, including PET-CT from 02/07/2017 FINDINGS: CT CHEST FINDINGS Cardiovascular: Left IJ line tip: Right atrium. Coronary, aortic arch, and branch vessel atherosclerotic vascular disease. Mediastinum/Nodes: Significant reduction in the right suprahilar mass, right hilar adenopathy, and lower paratracheal adenopathy. A low-density right lower paratracheal node measures 1.4 cm in short axis on image 22/2 and previously measured 2.2 cm. This lesion is also less dense than on the prior exam. A right hilar lymph node measures 1.6 cm in short axis on image 24/2, previous 2.2 cm. Lungs/Pleura: The right hilar mass measures approximately 2.0 by 2.4 cm on image 23/2, new formerly approximately 3.7 by 4.8 cm. This also demonstrates reduced density compared the prior exam. The hypermetabolic peripheral lesion in the right upper lobe measures 1.6 by 1.2 cm on image 31/4, previously 2.2 by 1.8 cm. Biapical pleuroparenchymal scarring. Right paramediastinal density likely due to atelectasis. Paraseptal emphysema. Bandlike left upper lobe subpleural density on images 39-40 series 4 is similar to prior and was not previously hypermetabolic, this merits surveillance. Musculoskeletal: Dextroconvex thoracic scoliosis.  Left mastectomy. CT ABDOMEN PELVIS FINDINGS Hepatobiliary: Contracted gallbladder.  Otherwise unremarkable. Pancreas: Unremarkable Spleen: Unremarkable Adrenals/Urinary Tract: Unremarkable Stomach/Bowel: Sigmoid diverticulosis without active diverticulitis. Vascular/Lymphatic: Aortoiliac atherosclerotic vascular disease. Right renal artery stent. 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 No pathologic adenopathy observed. Reproductive: Unremarkable Other: No supplemental non-categorized findings. Musculoskeletal: Mild kyphotic curvature centered at approximately L1 without compression fracture. There is lumbar spine spondylosis and degenerative disc disease resulting in some mild foraminal impingement are notably on the right at L3-4 and L4-5. No findings of skeletal metastatic disease. IMPRESSION: 1. Significant reduction in size of the right hilar mass, mediastinal adenopathy, and peripheral right hilar nodule compared to the prior PET-CT of 02/07/2017 indicating interval effective therapy. 2. No findings of new metastatic disease or involvement of the abdomen/pelvis or skeleton. 3. Other imaging findings of potential clinical significance: Aortic Atherosclerosis (ICD10-I70.0) and Emphysema (ICD10-J43.9). Coronary atherosclerosis. Sigmoid  diverticulosis. 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 Lumbar impingement due to spondylosis and degenerative disc disease. Electronically Signed   By: Van Clines M.D.   On: 04/06/2017 12:25   Ct Abdomen Pelvis W Contrast  Result Date: 04/06/2017 CLINICAL DATA:  Restaging of right upper lobe small cell lung cancer, initial diagnosis September 2018, status post chemotherapy and radiation therapy. EXAM: CT CHEST, ABDOMEN, AND PELVIS WITH CONTRAST TECHNIQUE: Multidetector CT imaging of the chest, abdomen and pelvis was performed following the standard protocol during bolus administration of intravenous contrast. CONTRAST:  18mL ISOVUE-300 IOPAMIDOL (ISOVUE-300) INJECTION 61% COMPARISON:  Multiple exams, including PET-CT from 02/07/2017 FINDINGS: CT CHEST FINDINGS Cardiovascular: Left IJ line tip: Right atrium. Coronary, aortic arch, and branch vessel atherosclerotic vascular disease.  Mediastinum/Nodes: Significant reduction in the right suprahilar mass, right hilar adenopathy, and lower paratracheal adenopathy. A low-density right lower paratracheal node measures 1.4 cm in short axis on image 22/2 and previously measured 2.2 cm. This lesion is also less dense than on the prior exam. A right hilar lymph node measures 1.6 cm in short axis on image 24/2, previous 2.2 cm. Lungs/Pleura: The right hilar mass measures approximately 2.0 by 2.4 cm on image 23/2, new formerly approximately 3.7 by 4.8 cm. This also demonstrates reduced density compared the prior exam. The hypermetabolic peripheral lesion in the right upper lobe measures 1.6 by 1.2 cm on image 31/4, previously 2.2 by 1.8 cm. Biapical pleuroparenchymal scarring. Right paramediastinal density likely due to atelectasis. Paraseptal emphysema. Bandlike left upper lobe subpleural density on images 39-40 series 4 is similar to prior and was not previously hypermetabolic, this merits surveillance. Musculoskeletal: Dextroconvex thoracic scoliosis.  Left mastectomy. CT ABDOMEN PELVIS FINDINGS Hepatobiliary: Contracted gallbladder.  Otherwise unremarkable. Pancreas: Unremarkable Spleen: Unremarkable Adrenals/Urinary Tract: Unremarkable Stomach/Bowel: Sigmoid diverticulosis without active diverticulitis. Vascular/Lymphatic: Aortoiliac atherosclerotic vascular disease. Right renal artery stent. 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 No pathologic adenopathy observed. Reproductive: Unremarkable Other: No supplemental non-categorized findings. Musculoskeletal: Mild kyphotic curvature centered at approximately L1 without compression fracture. There is lumbar spine spondylosis and degenerative disc disease resulting in some mild foraminal impingement are  notably on the right at L3-4 and L4-5. No findings of skeletal metastatic disease. IMPRESSION: 1. Significant reduction in size of the right hilar mass, mediastinal adenopathy, and peripheral right hilar nodule compared to the prior PET-CT of 02/07/2017 indicating interval effective therapy. 2. No findings of new metastatic disease or involvement of the abdomen/pelvis or skeleton. 3. Other imaging findings of potential clinical significance: Aortic Atherosclerosis (ICD10-I70.0) and Emphysema (ICD10-J43.9). Coronary atherosclerosis. Sigmoid diverticulosis. 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 Lumbar impingement due to spondylosis and degenerative disc disease. Electronically Signed   By: Van Clines M.D.   On: 04/06/2017 12:25   Dg Chest Port 1 View  Result Date: 04/28/2017 CLINICAL DATA:  Known right upper lobe small cell lung cancer, fever EXAM: PORTABLE CHEST 1 VIEW COMPARISON:  04/06/2017 FINDINGS: Left IJ power port catheter tip SVC RA junction. Ill-defined right hilar mass noted but better demonstrated by CT comparison. Biapical pleural-parenchymal scarring and nodularity appears grossly stable. Mild background stable interstitial prominence. No superimposed pneumonia, significant collapse or consolidation. Negative for CHF or edema. No large effusion or pneumothorax. Trachea is midline. Aorta is atherosclerotic. Bones  are osteopenic. IMPRESSION: Stable right upper lobe hilar mass and chronic changes as above. No definite superimposed acute process or edema. No large effusion or pneumothorax. Electronically Signed   By: Jerilynn Mages.  Shick M.D.   On: 04/28/2017 10:34     Assessment and plan- Patient is a 71 y.o. female with limited stage small cell lung cancer status post 3 cycles of chemotherapy with cisplatin and etoposide and concurrent radiation admitted for neutropenic fever  1.  Neutropenic fever: No clear source and blood cultures have been negative so far.  Chest  x-ray did not reveal any pneumonia.  She has received 1 week of Augmentin as an outpatient.  She is currently on vancomycin and Zosyn.  She would need antibiotics at least for 7-10 days especially since she is neutropenic.  Continue Neupogen 480 mcg daily until [place] greater than 3.  2.  Pancytopenia: Secondary to chemotherapy patient received 1 unit of PRBC yesterday anemia studies were done which are consistent with iron deficiency in addition to anemia of chronic disease.  She also has borderline low B12 levels.  I will start her on oral B12 once a day.  Please give 1 dose of IV iron as an inpatient and I will give her further doses as an outpatient.  Thrombocytopenia secondary to chemotherapy continue to monitor no indication for platelet transfusion unless platelet count less than 10 or there is evidence of clinical bleeding.  Her platelet counts showed normalize in the next week or 10 days.  3.  Limited stage small cell lung cancer: Patient did not receive Neulasta with the first 3 cycles of chemotherapy as she was concurrently getting radiation.  Patient is due to get her fourth cycle of chemotherapy on 05/07/2017 which will be delayed by a week due to current hospitalization.  She will be getting Neulasta with that cycle and I might potentially switch her to carboplatin given her hearing loss although carboplatin is more myelosuppressive as compared to cisplatin.  Plan is to stop treatment after 4 cycles of chemotherapy.  4. Hypokalemia, hypomagnesemia and hypophosphatemia: Secondary to poor nutrition and underlying chemotherapy.  She is getting IV replacements   I will continue to follow the patient while she is inpatient Visit Diagnosis 1. Sepsis, due to unspecified organism ([place])   2. Severe sepsis ([place])   3. Septic shock ([place])   4. Sepsis ([place])   5. Chemotherapy-induced neutropenia ([facility])      Dr. Randa Evens, MD, MPH [facility] at [facility] Pager-  [phone] 04/30/2017 9:12 AM

## 2017-05-01 DIAGNOSIS — D709 Neutropenia, unspecified: Secondary | ICD-10-CM

## 2017-05-01 DIAGNOSIS — T451X5S Adverse effect of antineoplastic and immunosuppressive drugs, sequela: Secondary | ICD-10-CM

## 2017-05-01 DIAGNOSIS — A419 Sepsis, unspecified organism: Principal | ICD-10-CM

## 2017-05-01 DIAGNOSIS — R6520 Severe sepsis without septic shock: Secondary | ICD-10-CM

## 2017-05-01 LAB — CBC WITH DIFFERENTIAL/PLATELET
Basophils Absolute: 0 10*3/uL (ref 0–0.1)
Basophils Relative: 0 %
EOS ABS: 0.1 10*3/uL (ref 0–0.7)
EOS PCT: 1 %
HEMATOCRIT: 27.7 % — AB (ref 35.0–47.0)
Hemoglobin: 9 g/dL — ABNORMAL LOW (ref 12.0–16.0)
LYMPHS ABS: 0.6 10*3/uL — AB (ref 1.0–3.6)
Lymphocytes Relative: 5 %
MCH: 28.3 pg (ref 26.0–34.0)
MCHC: 32.4 g/dL (ref 32.0–36.0)
MCV: 87.5 fL (ref 80.0–100.0)
MONO ABS: 1.5 10*3/uL — AB (ref 0.2–0.9)
Monocytes Relative: 13 %
Neutro Abs: 9.7 10*3/uL — ABNORMAL HIGH (ref 1.4–6.5)
Neutrophils Relative %: 81 %
Platelets: 53 10*3/uL — ABNORMAL LOW (ref 150–440)
RBC: 3.17 MIL/uL — AB (ref 3.80–5.20)
RDW: 16.1 % — AB (ref 11.5–14.5)
WBC: 11.9 10*3/uL — AB (ref 3.6–11.0)

## 2017-05-01 LAB — PROTIME-INR
INR: 1.11
Prothrombin Time: 14.2 s (ref 11.4–15.2)

## 2017-05-01 LAB — COMPREHENSIVE METABOLIC PANEL WITH GFR
ALT: 14 U/L (ref 14–54)
AST: 22 U/L (ref 15–41)
Albumin: 2.2 g/dL — ABNORMAL LOW (ref 3.5–5.0)
Alkaline Phosphatase: 94 U/L (ref 38–126)
Anion gap: 7 (ref 5–15)
BUN: <5 mg/dL — ABNORMAL LOW (ref 6–20)
CO2: 25 mmol/L (ref 22–32)
Calcium: 7.7 mg/dL — ABNORMAL LOW (ref 8.9–10.3)
Chloride: 106 mmol/L (ref 101–111)
Creatinine, Ser: 0.69 mg/dL (ref 0.44–1.00)
GFR calc Af Amer: >60 mL/min
GFR calc non Af Amer: >60 mL/min
Glucose, Bld: 84 mg/dL (ref 65–99)
Potassium: 3.5 mmol/L (ref 3.5–5.1)
Sodium: 138 mmol/L (ref 135–145)
Total Bilirubin: 0.5 mg/dL (ref 0.3–1.2)
Total Protein: 5 g/dL — ABNORMAL LOW (ref 6.5–8.1)

## 2017-05-01 MED ORDER — ACETAMINOPHEN 325 MG PO TABS
650.0000 mg | ORAL_TABLET | Freq: Four times a day (QID) | ORAL | Status: DC | PRN
  Administered 2017-05-01: 650 mg via ORAL
  Filled 2017-05-01: qty 2

## 2017-05-01 MED ORDER — POTASSIUM CHLORIDE 20 MEQ PO PACK
40.0000 meq | PACK | Freq: Once | ORAL | Status: AC
  Administered 2017-05-01: 40 meq via ORAL
  Filled 2017-05-01: qty 2

## 2017-05-01 NOTE — Plan of Care (Signed)
  Progressing Education: Knowledge of General Education information will improve 05/01/2017 1849 - Progressing by Marga Hoots, RN Health Behavior/Discharge Planning: Ability to manage health-related needs will improve 05/01/2017 1849 - Progressing by Marga Hoots, RN Clinical Measurements: Ability to maintain clinical measurements within normal limits will improve 05/01/2017 1849 - Progressing by Marga Hoots, RN Will remain free from infection 05/01/2017 1849 - Progressing by Marga Hoots, RN Diagnostic test results will improve 05/01/2017 1849 - Progressing by Marga Hoots, RN Respiratory complications will improve 05/01/2017 1849 - Progressing by Marga Hoots, RN Cardiovascular complication will be avoided 05/01/2017 1849 - Progressing by Marga Hoots, RN Activity: Risk for activity intolerance will decrease 05/01/2017 1849 - Progressing by Marga Hoots, RN Nutrition: Adequate nutrition will be maintained 05/01/2017 1849 - Progressing by Marga Hoots, RN Coping: Level of anxiety will decrease 05/01/2017 1849 - Progressing by Marga Hoots, RN Elimination: Will not experience complications related to bowel motility 05/01/2017 1849 - Progressing by Marga Hoots, RN Will not experience complications related to urinary retention 05/01/2017 1849 - Progressing by Marga Hoots, RN Pain Managment: General experience of comfort will improve 05/01/2017 1849 - Progressing by Marga Hoots, RN Safety: Ability to remain free from injury will improve 05/01/2017 1849 - Progressing by Marga Hoots, RN Skin Integrity: Risk for impaired skin integrity will decrease 05/01/2017 1849 - Progressing by Marga Hoots, RN

## 2017-05-01 NOTE — Progress Notes (Addendum)
No new complaints No distress  Vitals:   05/01/17 1100 05/01/17 1200  BP: 125/70 129/81  Pulse: (!) 104 (!) 106  Resp: (!) 25 20  Temp:  97.8 F (36.6 C)  SpO2: 93% 93%     Gen:  NAD HEENT: NCAT Lungs: full BS, no adventitious sounds Cardiovascular: Reg rate, no M noted Abdomen: Soft, NT, +BS Ext: no C/C/E Neuro: No focal deficits  BMP Latest Ref Rng & Units 05/01/2017 04/30/2017 04/30/2017  Glucose 65 - 99 mg/dL 84 99 80  BUN 6 - 20 mg/dL <5(L) 5(L) 6  Creatinine 0.44 - 1.00 mg/dL 0.69 0.65 0.63  Sodium 135 - 145 mmol/L 138 138 137  Potassium 3.5 - 5.1 mmol/L 3.5 3.4(L) 2.9(L)  Chloride 101 - 111 mmol/L 106 105 106  CO2 22 - 32 mmol/L 25 26 24   Calcium 8.9 - 10.3 mg/dL 7.7(L) 7.4(L) 7.2(L)    CBC Latest Ref Rng & Units 05/01/2017 04/30/2017 04/29/2017  WBC 3.6 - 11.0 K/uL 11.9(H) 2.6(L) -  Hemoglobin 12.0 - 16.0 g/dL 9.0(L) 9.1(L) 8.1(L)  Hematocrit 35.0 - 47.0 % 27.7(L) 26.9(L) 24.3(L)  Platelets 150 - 440 K/uL 53(L) 35(L) -    No new CXR  IMPRESSION: Severe sepsis Neutropenia, resolved Thrombocytopenia COPD Lung cancer DNR  PLAN/REC: Transfer to [place] current medical therapies Would complete 7 days of Zosyn After transfer, PCCM will sign off. Please call if we can be of further assistance    Merton Border, MD PCCM service Mobile [phone] Pager [phone] 05/01/2017 1:27 PM

## 2017-05-01 NOTE — Progress Notes (Addendum)
[place] [facility] [place] NAME: Roberta [name]    MR#:  [id number]  DATE OF BIRTH:  03/03/1946  SUBJECTIVE:  CHIEF COMPLAINT:   Chief Complaint  Patient presents with  . Fever   The patient has generalized weakness, hypotension improved. REVIEW OF SYSTEMS:  Review of Systems  Constitutional: Positive for malaise/fatigue. Negative for chills and fever.  HENT: Negative for sore throat.   Eyes: Negative for blurred vision and double vision.  Respiratory: Negative for cough, hemoptysis, shortness of breath, wheezing and stridor.   Cardiovascular: Negative for chest pain, palpitations, orthopnea and leg swelling.  Gastrointestinal: Negative for abdominal pain, blood in stool, diarrhea, melena, nausea and vomiting.  Genitourinary: Negative for dysuria, flank pain and hematuria.  Musculoskeletal: Negative for back pain and joint pain.  Skin: Negative for rash.  Neurological: Positive for weakness. Negative for dizziness, sensory change, focal weakness, seizures, loss of consciousness and headaches.  Endo/Heme/Allergies: Negative for polydipsia.  Psychiatric/Behavioral: Negative for depression. The patient is not nervous/anxious.     DRUG ALLERGIES:  No Known Allergies VITALS:  Blood pressure 129/60, pulse (!) 107, temperature 97.6 F (36.4 C), temperature source Oral, resp. rate 18, height '5\' 3"'  (1.6 m), weight 145 lb 1.6 oz (65.8 kg), SpO2 94 %. PHYSICAL EXAMINATION:  Physical Exam  Constitutional: She is oriented to person, place, and time and well-developed, well-nourished, and in no distress.  HENT:  Head: Normocephalic.  Mouth/Throat: Oropharynx is clear and moist.  Eyes: Conjunctivae and EOM are normal. Pupils are equal, round, and reactive to light. No scleral icterus.  Neck: Normal range of motion. Neck supple. No JVD present. No tracheal deviation present.  Cardiovascular: Normal rate, regular rhythm and normal heart sounds. Exam  reveals no gallop.  No murmur heard. Pulmonary/Chest: Effort normal and breath sounds normal. No respiratory distress. She has no wheezes. She has no rales.  Abdominal: Soft. Bowel sounds are normal. She exhibits no distension. There is no tenderness. There is no rebound.  Musculoskeletal: Normal range of motion. She exhibits no edema or tenderness.  Neurological: She is alert and oriented to person, place, and time. No cranial nerve deficit.  Skin: No rash noted. No erythema.  Psychiatric: Affect normal.   LABORATORY PANEL:  Female CBC Recent Labs  Lab 05/01/17 0618  WBC 11.9*  HGB 9.0*  HCT 27.7*  PLT 53*   ------------------------------------------------------------------------------------------------------------------ Chemistries  Recent Labs  Lab 04/30/17 1707 05/01/17 0618  NA 138 138  K 3.4* 3.5  CL 105 106  CO2 26 25  GLUCOSE 99 84  BUN 5* <5*  CREATININE 0.65 0.69  CALCIUM 7.4* 7.7*  MG 2.1  --   AST  --  22  ALT  --  14  ALKPHOS  --  94  BILITOT  --  0.5   RADIOLOGY:  No results found. ASSESSMENT AND PLAN:   Roberta [name]  is a 71 y.o. female with a known history of stage III small cell lung cancer, recent chemotherapy and radiation therapy, chronic history of COPD, hyperlipidemia, AAA status post stent, still continues to smoke, recently used Augmentin a few days ago for possible pneumonia is presenting to ED with a chief complaint of almost passing out spell and feeling weak and terrible. Patient was found to be hypotensive tachycardic and lactic acid 3.  # Sepsis with severe hypotension Met septic criteria with hypotension, tachycardia. Elevated lactic acid continue IV fluid, zosyn and discontinued vancomycin Negative blood cultures and urine  cultures so far. Would complete 7 days of Zosyn per Dr. Alva Garnet. Chest x-ray negative   #Neutropenic fever with recent history of chemotherapy, improved. She would need antibiotics at least for 7-10 days  especially since she is neutropenic.  Continue Neupogen 480 mcg daily until Carter greater than 3 per Dr. Janese Banks. Neutropenic precautions.  #Stage III small cell lung cancer status post chemotherapy and radiation therapy.   #Thrombocytopenia No active bleeding or bruising Per Dr. Janese Banks, Thrombocytopenia secondary to chemotherapy continue to monitor no indication for platelet transfusion unless platelet count less than 10 or there is evidence of clinical bleeding.  Her platelet counts showed normalize in the next week or 10 days.  Anemia of chronic disease.   hemoglobin is up to 9.1 after 2 unit PRBC transfusion. Stable.  #Chronic history of COPD, stable. Bronchodilator therapy  #Tobacco abuse disorder Consult patient to quit smoking, on nicotine patch  Hypokalemia.  Given potassium supplement and improved. Hypomagnesemia.  Improved with IV magnesium. Hypophosphatemia.  Given phosphorus and improved. Hyponatremia.  Improved with normal saline IV.  Severe malnutrition.  Follow dietitian's recommendation. Generalized weakness.  PT evaluation. All the records are reviewed and case discussed with Care Management/Social Worker. Management plans discussed with the patient, and they are in agreement.  CODE STATUS: DNR  TOTAL TIME TAKING CARE OF THIS PATIENT: 33 minutes.   More than 50% of the time was spent in counseling/coordination of care: YES  POSSIBLE D/C IN 1-2 DAYS, DEPENDING ON CLINICAL CONDITION.   Demetrios Loll M.D on 05/01/2017 at 1:46 PM  Between 7am to 6pm - Pager - [phone]  After 6pm go to [email or web address] - [facility]

## 2017-05-01 NOTE — Progress Notes (Addendum)
Hematology/Oncology Consult note [facility]  Telephone:([phone] [phone]  Patient Care Team: Gayland Curry, MD as PCP - General (Family Medicine) Gayland Curry, MD (Family Medicine) Telford Nab, RN as Registered Nurse   Name of the patient: Roberta [name]  [id number]  02-25-1946     Interval history- she feels significantly better today. Still fatigued but almost back to her baseline and wants to go home  ECOG PS- 2 Pain scale- 0   Review of systems- Review of Systems  Constitutional: Positive for malaise/fatigue. Negative for chills, fever and weight loss.  HENT: Negative for congestion, ear discharge and nosebleeds.   Eyes: Negative for blurred vision.  Respiratory: Negative for cough, hemoptysis, sputum production, shortness of breath and wheezing.   Cardiovascular: Negative for chest pain, palpitations, orthopnea and claudication.  Gastrointestinal: Negative for abdominal pain, blood in stool, constipation, diarrhea, heartburn, melena, nausea and vomiting.  Genitourinary: Negative for dysuria, flank pain, frequency, hematuria and urgency.  Musculoskeletal: Negative for back pain, joint pain and myalgias.  Skin: Negative for rash.  Neurological: Negative for dizziness, tingling, focal weakness, seizures, weakness and headaches.  Endo/Heme/Allergies: Does not bruise/bleed easily.  Psychiatric/Behavioral: Negative for depression and suicidal ideas. The patient does not have insomnia.      No Known Allergies   Past Medical History:  Diagnosis Date  . AAA (abdominal aortic aneurysm) ([place])   . Breast cancer ([place]) 2002   left  . COPD (chronic obstructive pulmonary disease) ([place])   . High cholesterol   . Personal history of chemotherapy   . Small cell lung cancer ([place])   . Tobacco abuse      Past Surgical History:  Procedure Laterality Date  . ABDOMINAL HYSTERECTOMY    . ENDOBRONCHIAL ULTRASOUND N/A 02/19/2017   Procedure: ENDOBRONCHIAL ULTRASOUND;  Surgeon: Flora Lipps, MD;  Location: [facility] ORS;  Service: Cardiopulmonary;  Laterality: N/A;  . ENDOVASCULAR REPAIR/STENT GRAFT N/A 12/06/2016   Procedure: Endovascular Repair/Stent Graft;  Surgeon: Katha Cabal, MD;  Location: [place] [facility];  Service: Cardiovascular;  Laterality: N/A;  . IR FLUORO GUIDE PORT INSERTION RIGHT  03/02/2017  . MASTECTOMY Left 2002   with sentinel node     Social History   Socioeconomic History  . Marital status: Widowed    Spouse name: Not on file  . Number of children: Not on file  . Years of education: Not on file  . Highest education level: Not on file  Social Needs  . Financial resource strain: Not on file  . Food insecurity - worry: Not on file  . Food insecurity - inability: Not on file  . Transportation needs - medical: Not on file  . Transportation needs - non-medical: Not on file  Occupational History  . Not on file  Tobacco Use  . Smoking status: Current Every Day Smoker    Packs/day: 0.50    Types: Cigarettes  . Smokeless tobacco: Never Used  Substance and Sexual Activity  . Alcohol use: No  . Drug use: No  . Sexual activity: No  Other Topics Concern  . Not on file  Social History Narrative  . Not on file    Family History  Problem Relation Age of Onset  . Breast cancer Neg Hx      Current Facility-Administered Medications:  .  albuterol (PROVENTIL) (2.5 MG/3ML) 0.083% nebulizer solution 2.5 mg, 2.5 mg, Nebulization, Q6H PRN, Gouru, Aruna, MD .  atorvastatin (LIPITOR) tablet 10 mg, 10 mg, Oral, QHS,  Nicholes Mango, MD, 10 mg at 04/30/17 2149 .  docusate sodium (COLACE) capsule 100 mg, 100 mg, Oral, BID, Gouru, Aruna, MD, 100 mg at 05/01/17 0917 .  feeding supplement (ENSURE ENLIVE) (ENSURE ENLIVE) liquid 237 mL, 237 mL, Oral, TID BM, Blakeney, Dana G, NP, 237 mL at 05/01/17 1456 .  guaiFENesin (ROBITUSSIN) 100 MG/5ML solution 200 mg, 200 mg, Oral, TID PRN, Patria Mane, Magadalene S, NP,  100 mg at 05/01/17 1603 .  LORazepam (ATIVAN) tablet 0.5 mg, 0.5 mg, Oral, Q6H PRN, Gouru, Aruna, MD, 0.5 mg at 05/01/17 0309 .  MEDLINE mouth rinse, 15 mL, Mouth Rinse, BID, Nettie Elm, MD, 15 mL at 05/01/17 3785 .  mometasone-formoterol (DULERA) 200-5 MCG/ACT inhaler 2 puff, 2 puff, Inhalation, BID, Gouru, Aruna, MD, 2 puff at 05/01/17 8850 .  multivitamin with minerals tablet 1 tablet, 1 tablet, Oral, Daily, Awilda Bill, NP, 1 tablet at 05/01/17 [phone]  nicotine (NICODERM CQ - dosed in mg/24 hours) patch 14 mg, 14 mg, Transdermal, Daily, Gouru, Aruna, MD, 14 mg at 05/01/17 0917 .  nystatin (MYCOSTATIN) 100000 UNIT/ML suspension 500,000 Units, 5 mL, Mouth/Throat, QID, Gouru, Aruna, MD, 500,000 Units at 05/01/17 1505 .  ondansetron (ZOFRAN) tablet 4 mg, 4 mg, Oral, Q6H PRN, Gouru, Aruna, MD .  piperacillin-tazobactam (ZOSYN) IVPB 3.375 g, 3.375 g, Intravenous, Q8H, Maccia, Melissa D, RPH, Last Rate: 12.5 mL/hr at 05/01/17 1455, 3.375 g at 05/01/17 1455 .  sucralfate (CARAFATE) tablet 1 g, 1 g, Oral, TID WC & HS, Gouru, Aruna, MD, 1 g at 05/01/17 1208 .  vitamin B-12 (CYANOCOBALAMIN) tablet 1,000 mcg, 1,000 mcg, Oral, Daily, Sindy Guadeloupe, MD, 1,000 mcg at 05/01/17 1287  Facility-Administered Medications Ordered in Other Encounters:  .  sodium chloride flush (NS) 0.9 % injection 10 mL, 10 mL, Intravenous, PRN, Sindy Guadeloupe, MD, 10 mL at 03/26/17 0948 .  sodium chloride flush (NS) 0.9 % injection 10 mL, 10 mL, Intravenous, PRN, Sindy Guadeloupe, MD, 10 mL at 04/16/17 1006  Physical exam:  Vitals:   05/01/17 1000 05/01/17 1100 05/01/17 1200 05/01/17 1325  BP: (!) 132/97 125/70 129/81 129/60  Pulse: (!) 113 (!) 104 (!) 106 (!) 107  Resp: 17 (!) 25 20 18   Temp:   97.8 F (36.6 C) 97.6 F (36.4 C)  TempSrc:   Oral Oral  SpO2: 96% 93% 93% 94%  Weight:    145 lb 1.6 oz (65.8 kg)  Height:    5\' 3"  (1.6 m)   Physical Exam  Constitutional: She is oriented to person, place, and time.    Thin elderly female in no acute distress  HENT:  Head: Normocephalic and atraumatic.  Eyes: EOM are normal. Pupils are equal, round, and reactive to light.  Neck: Normal range of motion.  Cardiovascular: Regular rhythm and normal heart sounds.  tachycardic  Pulmonary/Chest: Effort normal and breath sounds normal.  Abdominal: Soft. Bowel sounds are normal.  Neurological: She is alert and oriented to person, place, and time.  Skin: Skin is warm and dry.     CMP Latest Ref Rng & Units 05/01/2017  Glucose 65 - 99 mg/dL 84  BUN 6 - 20 mg/dL <5(L)  Creatinine 0.44 - 1.00 mg/dL 0.69  Sodium 135 - 145 mmol/L 138  Potassium 3.5 - 5.1 mmol/L 3.5  Chloride 101 - 111 mmol/L 106  CO2 22 - 32 mmol/L 25  Calcium 8.9 - 10.3 mg/dL 7.7(L)  Total Protein 6.5 - 8.1 g/dL 5.0(L)  Total Bilirubin 0.3 -  1.2 mg/dL 0.5  Alkaline Phos 38 - 126 U/L 94  AST 15 - 41 U/L 22  ALT 14 - 54 U/L 14   CBC Latest Ref Rng & Units 05/01/2017  WBC 3.6 - 11.0 K/uL 11.9(H)  Hemoglobin 12.0 - 16.0 g/dL 9.0(L)  Hematocrit 35.0 - 47.0 % 27.7(L)  Platelets 150 - 440 K/uL 53(L)    @IMAGES @  Ct Chest W Contrast  Result Date: 04/06/2017 CLINICAL DATA:  Restaging of right upper lobe small cell lung cancer, initial diagnosis September 2018, status post chemotherapy and radiation therapy. EXAM: CT CHEST, ABDOMEN, AND PELVIS WITH CONTRAST TECHNIQUE: Multidetector CT imaging of the chest, abdomen and pelvis was performed following the standard protocol during bolus administration of intravenous contrast. CONTRAST:  130mL ISOVUE-300 IOPAMIDOL (ISOVUE-300) INJECTION 61% COMPARISON:  Multiple exams, including PET-CT from 02/07/2017 FINDINGS: CT CHEST FINDINGS Cardiovascular: Left IJ line tip: Right atrium. Coronary, aortic arch, and branch vessel atherosclerotic vascular disease. Mediastinum/Nodes: Significant reduction in the right suprahilar mass, right hilar adenopathy, and lower paratracheal adenopathy. A low-density right lower  paratracheal node measures 1.4 cm in short axis on image 22/2 and previously measured 2.2 cm. This lesion is also less dense than on the prior exam. A right hilar lymph node measures 1.6 cm in short axis on image 24/2, previous 2.2 cm. Lungs/Pleura: The right hilar mass measures approximately 2.0 by 2.4 cm on image 23/2, new formerly approximately 3.7 by 4.8 cm. This also demonstrates reduced density compared the prior exam. The hypermetabolic peripheral lesion in the right upper lobe measures 1.6 by 1.2 cm on image 31/4, previously 2.2 by 1.8 cm. Biapical pleuroparenchymal scarring. Right paramediastinal density likely due to atelectasis. Paraseptal emphysema. Bandlike left upper lobe subpleural density on images 39-40 series 4 is similar to prior and was not previously hypermetabolic, this merits surveillance. Musculoskeletal: Dextroconvex thoracic scoliosis.  Left mastectomy. CT ABDOMEN PELVIS FINDINGS Hepatobiliary: Contracted gallbladder.  Otherwise unremarkable. Pancreas: Unremarkable Spleen: Unremarkable Adrenals/Urinary Tract: Unremarkable Stomach/Bowel: Sigmoid diverticulosis without active diverticulitis. Vascular/Lymphatic: Aortoiliac atherosclerotic vascular disease. Right renal artery stent. 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 No pathologic adenopathy observed. Reproductive: Unremarkable Other: No supplemental non-categorized findings. Musculoskeletal: Mild kyphotic curvature centered at approximately L1 without compression fracture. There is lumbar spine spondylosis and degenerative disc disease resulting in some mild foraminal impingement are notably on the right at L3-4 and L4-5. No findings of skeletal metastatic disease. IMPRESSION: 1. Significant reduction in size of the right hilar mass,  mediastinal adenopathy, and peripheral right hilar nodule compared to the prior PET-CT of 02/07/2017 indicating interval effective therapy. 2. No findings of new metastatic disease or involvement of the abdomen/pelvis or skeleton. 3. Other imaging findings of potential clinical significance: Aortic Atherosclerosis (ICD10-I70.0) and Emphysema (ICD10-J43.9). Coronary atherosclerosis. Sigmoid diverticulosis. 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 Lumbar impingement due to spondylosis and degenerative disc disease. Electronically Signed   By: Van Clines M.D.   On: 04/06/2017 12:25   Ct Abdomen Pelvis W Contrast  Result Date: 04/06/2017 CLINICAL DATA:  Restaging of right upper lobe small cell lung cancer, initial diagnosis September 2018, status post chemotherapy and radiation therapy. EXAM: CT CHEST, ABDOMEN, AND PELVIS WITH CONTRAST TECHNIQUE: Multidetector CT imaging of the chest, abdomen and pelvis was performed following the standard protocol during bolus  administration of intravenous contrast. CONTRAST:  138mL ISOVUE-300 IOPAMIDOL (ISOVUE-300) INJECTION 61% COMPARISON:  Multiple exams, including PET-CT from 02/07/2017 FINDINGS: CT CHEST FINDINGS Cardiovascular: Left IJ line tip: Right atrium. Coronary, aortic arch, and branch vessel atherosclerotic vascular disease. Mediastinum/Nodes: Significant reduction in the right suprahilar mass, right hilar adenopathy, and lower paratracheal adenopathy. A low-density right lower paratracheal node measures 1.4 cm in short axis on image 22/2 and previously measured 2.2 cm. This lesion is also less dense than on the prior exam. A right hilar lymph node measures 1.6 cm in short axis on image 24/2, previous 2.2 cm. Lungs/Pleura: The right hilar mass measures approximately 2.0 by 2.4 cm on image 23/2, new formerly approximately 3.7 by 4.8 cm. This also demonstrates reduced density compared the prior exam. The hypermetabolic peripheral  lesion in the right upper lobe measures 1.6 by 1.2 cm on image 31/4, previously 2.2 by 1.8 cm. Biapical pleuroparenchymal scarring. Right paramediastinal density likely due to atelectasis. Paraseptal emphysema. Bandlike left upper lobe subpleural density on images 39-40 series 4 is similar to prior and was not previously hypermetabolic, this merits surveillance. Musculoskeletal: Dextroconvex thoracic scoliosis.  Left mastectomy. CT ABDOMEN PELVIS FINDINGS Hepatobiliary: Contracted gallbladder.  Otherwise unremarkable. Pancreas: Unremarkable Spleen: Unremarkable Adrenals/Urinary Tract: Unremarkable Stomach/Bowel: Sigmoid diverticulosis without active diverticulitis. Vascular/Lymphatic: Aortoiliac atherosclerotic vascular disease. Right renal artery stent. 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 No pathologic adenopathy observed. Reproductive: Unremarkable Other: No supplemental non-categorized findings. Musculoskeletal: Mild kyphotic curvature centered at approximately L1 without compression fracture. There is lumbar spine spondylosis and degenerative disc disease resulting in some mild foraminal impingement are notably on the right at L3-4 and L4-5. No findings of skeletal metastatic disease. IMPRESSION: 1. Significant reduction in size of the right hilar mass, mediastinal adenopathy, and peripheral right hilar nodule compared to the prior PET-CT of 02/07/2017 indicating interval effective therapy. 2. No findings of new metastatic disease or involvement of the abdomen/pelvis or skeleton. 3. Other imaging findings of potential clinical significance: Aortic Atherosclerosis (ICD10-I70.0) and Emphysema (ICD10-J43.9). Coronary atherosclerosis. Sigmoid diverticulosis. Stable appearance of aortic stent graft repair of  infrarenal aneurysm. Lumbar impingement due to spondylosis and degenerative disc disease. Electronically Signed   By: Van Clines M.D.   On: 04/06/2017 12:25   Dg Chest Port 1 View  Result Date: 04/28/2017 CLINICAL DATA:  Known right upper lobe small cell lung cancer, fever EXAM: PORTABLE CHEST 1 VIEW COMPARISON:  04/06/2017 FINDINGS: Left IJ power port catheter tip SVC RA junction. Ill-defined right hilar mass noted but better demonstrated by CT comparison. Biapical pleural-parenchymal scarring and nodularity appears grossly stable. Mild background stable interstitial prominence. No superimposed pneumonia, significant collapse or consolidation. Negative for CHF or edema. No large effusion or pneumothorax. Trachea is midline. Aorta is atherosclerotic. Bones are osteopenic. IMPRESSION: Stable right upper lobe hilar mass and chronic changes as above. No definite superimposed acute process or edema. No large effusion or pneumothorax. Electronically Signed   By: Jerilynn Mages.  Shick M.D.   On: 04/28/2017 10:34     Assessment and plan- Patient is a 71 y.o. female with limited stage small cell lung cancer status post 3 cycles of chemotherapy with cisplatin and etoposide and concurrent radiation admitted for neutropenic fever    1. Neutropenic fever- neutropenia has resolved. neupogen discontinued. Thrombocytopenia is improving. Ok to  d/c patient from oncology standpoint in 1-2 days on PO antibiotics. I will f/u with her next week as an outpatient. Blood and urine cultures have been negative. No fever. Clinically patient looks better  2. Limited stage small cell lung cancer- will plan to delay chemotherapy by 1 week  3. Chemotherapy induced anemia along with component of iron and b12 deficiency. Continue po b12. Will plan to give feraheme as an outpatient   Visit Diagnosis 1. Sepsis, due to unspecified organism ([employer])   2. Severe sepsis ([place])   3. Septic shock ([place])   4. Sepsis ([place])   5.  Chemotherapy-induced neutropenia ([facility])   6. Small cell lung cancer, right upper lobe ([place])      Dr. Randa Evens, MD, MPH [facility] Pager- [phone] 05/01/2017 4:45 PM

## 2017-05-02 LAB — COMPREHENSIVE METABOLIC PANEL
ALT: 14 U/L (ref 14–54)
AST: 23 U/L (ref 15–41)
Albumin: 2 g/dL — ABNORMAL LOW (ref 3.5–5.0)
Alkaline Phosphatase: 117 U/L (ref 38–126)
Anion gap: 9 (ref 5–15)
BUN: 5 mg/dL — AB (ref 6–20)
CALCIUM: 8 mg/dL — AB (ref 8.9–10.3)
CO2: 29 mmol/L (ref 22–32)
CREATININE: 0.56 mg/dL (ref 0.44–1.00)
Chloride: 102 mmol/L (ref 101–111)
GFR calc Af Amer: >60 mL/min (ref >60)
GFR calc non Af Amer: >60 mL/min (ref >60)
Glucose, Bld: 86 mg/dL (ref 65–99)
Potassium: 3.5 mmol/L (ref 3.5–5.1)
SODIUM: 140 mmol/L (ref 135–145)
TOTAL PROTEIN: 4.7 g/dL — AB (ref 6.5–8.1)
Total Bilirubin: 0.3 mg/dL (ref 0.3–1.2)

## 2017-05-02 LAB — CBC WITH DIFFERENTIAL/PLATELET
BASOS PCT: 0 %
Basophils Absolute: 0 10*3/uL (ref 0–0.1)
EOS PCT: 1 %
Eosinophils Absolute: 0.2 10*3/uL (ref 0–0.7)
HCT: 27.3 % — ABNORMAL LOW (ref 35.0–47.0)
HEMOGLOBIN: 9.1 g/dL — AB (ref 12.0–16.0)
LYMPHS PCT: 5 %
Lymphs Abs: 1.2 10*3/uL (ref 1.0–3.6)
MCH: 28.9 pg (ref 26.0–34.0)
MCHC: 33.2 g/dL (ref 32.0–36.0)
MCV: 87 fL (ref 80.0–100.0)
Monocytes Absolute: 2.2 10*3/uL — ABNORMAL HIGH (ref 0.2–0.9)
Monocytes Relative: 9 %
NEUTROS PCT: 85 %
Neutro Abs: 20.5 10*3/uL — ABNORMAL HIGH (ref 1.4–6.5)
Platelets: 71 10*3/uL — ABNORMAL LOW (ref 150–440)
RBC: 3.14 MIL/uL — ABNORMAL LOW (ref 3.80–5.20)
RDW: 16.1 % — ABNORMAL HIGH (ref 11.5–14.5)
WBC: 24.1 10*3/uL — ABNORMAL HIGH (ref 3.6–11.0)

## 2017-05-02 LAB — PROTIME-INR
INR: 1.05
Prothrombin Time: 13.6 seconds (ref 11.4–15.2)

## 2017-05-02 MED ORDER — HEPARIN SOD (PORK) LOCK FLUSH 100 UNIT/ML IV SOLN
500.0000 [IU] | Freq: Once | INTRAVENOUS | Status: AC
  Administered 2017-05-02: 12:00:00 500 [IU] via INTRAVENOUS
  Filled 2017-05-02: qty 5

## 2017-05-02 MED ORDER — AMOXICILLIN-POT CLAVULANATE 875-125 MG PO TABS
1.0000 | ORAL_TABLET | Freq: Two times a day (BID) | ORAL | Status: DC
  Administered 2017-05-02: 12:00:00 1 via ORAL
  Filled 2017-05-02: qty 1

## 2017-05-02 MED ORDER — AMOXICILLIN-POT CLAVULANATE 875-125 MG PO TABS: 1.0000 | ORAL_TABLET | Freq: Two times a day (BID) | ORAL | 0 refills | Status: AC

## 2017-05-02 MED ORDER — GUAIFENESIN 100 MG/5ML PO SYRP: 200.0000 mg | ORAL_SOLUTION | Freq: Three times a day (TID) | ORAL | 0 refills | Status: DC | PRN

## 2017-05-02 NOTE — Discharge Summary (Signed)
[facility] NAME: Roberta [name]    MR#:  [id number]  DATE OF BIRTH:  28-Mar-1946  DATE OF ADMISSION:  04/28/2017   ADMITTING PHYSICIAN: Nicholes Mango, MD  DATE OF DISCHARGE: 05/02/2017 12:20 PM  PRIMARY CARE PHYSICIAN: Gayland Curry, MD   ADMISSION DIAGNOSIS:  Septic shock ([place]) [A41.9, R65.21] Severe sepsis ([place]) [A41.9, R65.20] Sepsis, due to unspecified organism ([place]) [A41.9] Sepsis ([place]) [A41.9] DISCHARGE DIAGNOSIS:  Active Problems:   Sepsis ([place])  SECONDARY DIAGNOSIS:   Past Medical History:  Diagnosis Date  . AAA (abdominal aortic aneurysm) ([place])   . Breast cancer ([place]) 2002   left  . COPD (chronic obstructive pulmonary disease) ([place])   . High cholesterol   . Personal history of chemotherapy   . Small cell lung cancer ([place])   . Tobacco abuse    HOSPITAL COURSE:   Roberta a71 y.o.femalewith a known history of stage III small cell lung cancer, recent chemotherapy and radiation therapy,chronic history of COPD, hyperlipidemia, AAA status post stent,still continues to smoke,recently used Augmentin a few days ago for possible pneumonia is presenting to ED with a chief complaint of almost passing out spell and feeling weak and terrible. Patient was found to be hypotensive tachycardic and lactic acid 3.  #Sepsis with severe hypotension Met septic criteria with hypotension, tachycardia. Elevated lactic acid Improved based IV fluid, zosyn and discontinued vancomycin Negative blood cultures and urine cultures so far. Would complete 7 days of Zosyn per Dr. Alva Garnet. Chest x-ray negative  Per Dr. Janese Banks, 2 more days p.o. antibiotics.  #Neutropenic fever with recent history of chemotherapy, improved. She would need antibiotics at least for 7-10 days especially since she is neutropenic. Continue Neupogen 480 mcg daily until [place] greater than 3 per Dr. Janese Banks. Neutropenic precautions. Neupogen was  discontinued due to elevated WBC.  #Stage III small cell lung cancer status post chemotherapy and radiation therapy.  Follow-up with Dr. Janese Banks as outpatient.  #Thrombocytopenia No active bleeding or bruising Per Dr. Janese Banks, Thrombocytopenia secondary to chemotherapy continue to monitor no indication for platelet transfusion unless platelet count less than 10 or there is evidence of clinical bleeding. Her platelet counts showed normalize in the next week or 10 days.  Anemia of chronic disease.   hemoglobin is up to 9.1 after 2 unit PRBC transfusion. Stable.  #Chronic history of COPD, stable. Bronchodilator therapy  #Tobacco abuse disorder Consult patient to quit smoking, on nicotine patch  Hypokalemia.  Given potassium supplement and improved. Hypomagnesemia.  Improved with IV magnesium. Hypophosphatemia.  Given phosphorus and improved. Hyponatremia.  Improved with normal saline IV.  Severe malnutrition.  Follow dietitian's recommendation. Generalized weakness.  PT evaluation, no need PT follow-up. DISCHARGE CONDITIONS:  Stable, discharged to home today. CONSULTS OBTAINED:   DRUG ALLERGIES:  No Known Allergies DISCHARGE MEDICATIONS:   Allergies as of 05/02/2017   No Known Allergies     Medication List    TAKE these medications   albuterol 108 (90 Base) MCG/ACT inhaler Commonly known as:  PROVENTIL HFA;VENTOLIN HFA Inhale 2 puffs into the lungs every 6 (six) hours as needed for wheezing or shortness of breath.   amoxicillin-clavulanate 875-125 MG tablet Commonly known as:  AUGMENTIN Take 1 tablet by mouth 2 (two) times daily for 2 days.   aspirin EC 81 MG tablet Take 81 mg by mouth daily.   atorvastatin 10 MG tablet Commonly known as:  LIPITOR Take 10 mg by mouth at bedtime.  dexamethasone 4 MG tablet Commonly known as:  DECADRON Take 2 tablets two times a day for 1 day on day 4 after cisplatin chemotherapy. Take with food.   fluticasone-salmeterol 115-21  MCG/ACT inhaler Commonly known as:  ADVAIR HFA Inhale 2 puffs into the lungs 2 (two) times daily.   guaifenesin 100 MG/5ML syrup Commonly known as:  ROBITUSSIN Take 10 mLs (200 mg total) by mouth 3 (three) times daily as needed for cough.   ipratropium 0.02 % nebulizer solution Commonly known as:  ATROVENT Take 0.5 mg by nebulization 4 (four) times daily.   lidocaine-prilocaine cream Commonly known as:  EMLA Apply to affected area once   LORazepam 0.5 MG tablet Commonly known as:  ATIVAN Take 1 tablet (0.5 mg total) by mouth every 6 (six) hours as needed (Nausea or vomiting).   nystatin 100000 UNIT/ML suspension Commonly known as:  MYCOSTATIN Use as directed 5 mLs in the mouth or throat 4 (four) times daily. Swish and swallow   ondansetron 8 MG tablet Commonly known as:  ZOFRAN Take 1 tablet (8 mg total) by mouth 2 (two) times daily as needed. Start on the third day after cisplatin chemotherapy.   oxyCODONE 5 MG immediate release tablet Commonly known as:  Oxy IR/ROXICODONE Take 1 tablet (5 mg total) by mouth every 6 (six) hours as needed for severe pain.   prochlorperazine 10 MG tablet Commonly known as:  COMPAZINE Take 1 tablet (10 mg total) by mouth every 6 (six) hours as needed (Nausea or vomiting).   sucralfate 1 g tablet Commonly known as:  CARAFATE Take 1 tablet (1 g total) 4 (four) times daily -  with meals and at bedtime by mouth. dissolve in 8 oz of water        DISCHARGE INSTRUCTIONS:  See AVS.  If you experience worsening of your admission symptoms, develop shortness of breath, life threatening emergency, suicidal or homicidal thoughts you must seek medical attention immediately by calling 911 or calling your MD immediately  if symptoms less severe.  You Must read complete instructions/literature along with all the possible adverse reactions/side effects for all the Medicines you take and that have been prescribed to you. Take any new Medicines after you  have completely understood and accpet all the possible adverse reactions/side effects.   Please note  You were cared for by a hospitalist during your hospital stay. If you have any questions about your discharge medications or the care you received while you were in the hospital after you are discharged, you can call the unit and asked to speak with the hospitalist on call if the hospitalist that took care of you is not available. Once you are discharged, your primary care physician will handle any further medical issues. Please note that NO REFILLS for any discharge medications will be authorized once you are discharged, as it is imperative that you return to your primary care physician (or establish a relationship with a primary care physician if you do not have one) for your aftercare needs so that they can reassess your need for medications and monitor your lab values.    On the day of Discharge:  VITAL SIGNS:  Blood pressure (!) 120/58, pulse (!) 120, temperature 98.2 F (36.8 C), temperature source Oral, resp. rate 20, height '5\' 3"'  (1.6 m), weight 145 lb 1.6 oz (65.8 kg), SpO2 90 %. PHYSICAL EXAMINATION:  GENERAL:  71 y.o.-year-old patient lying in the bed with no acute distress.  EYES: Pupils equal, round, reactive  to light and accommodation. No scleral icterus. Extraocular muscles intact.  HEENT: Head atraumatic, normocephalic. Oropharynx and nasopharynx clear.  NECK:  Supple, no jugular venous distention. No thyroid enlargement, no tenderness.  LUNGS: Normal breath sounds bilaterally, no wheezing, rales,rhonchi or crepitation. No use of accessory muscles of respiration.  CARDIOVASCULAR: S1, S2 normal. No murmurs, rubs, or gallops.  ABDOMEN: Soft, non-tender, non-distended. Bowel sounds present. No organomegaly or mass.  EXTREMITIES: No pedal edema, cyanosis, or clubbing.  NEUROLOGIC: Cranial nerves II through XII are intact. Muscle strength 5/5 in all extremities. Sensation intact. Gait  not checked.  PSYCHIATRIC: The patient is alert and oriented x 3.  SKIN: No obvious rash, lesion, or ulcer.  DATA REVIEW:   CBC Recent Labs  Lab 05/02/17 0526  WBC 24.1*  HGB 9.1*  HCT 27.3*  PLT 71*    Chemistries  Recent Labs  Lab 04/30/17 1707  05/02/17 0526  NA 138   < > 140  K 3.4*   < > 3.5  CL 105   < > 102  CO2 26   < > 29  GLUCOSE 99   < > 86  BUN 5*   < > 5*  CREATININE 0.65   < > 0.56  CALCIUM 7.4*   < > 8.0*  MG 2.1  --   --   AST  --    < > 23  ALT  --    < > 14  ALKPHOS  --    < > 117  BILITOT  --    < > 0.3   < > = values in this interval not displayed.     Microbiology Results  Results for orders placed or performed during the hospital encounter of 04/28/17  Blood Culture (routine x 2)     Status: None (Preliminary result)   Collection Time: 04/28/17 10:02 AM  Result Value Ref Range Status   Specimen Description BLOOD BLOOD RIGHT HAND  Final   Special Requests   Final    BOTTLES DRAWN AEROBIC AND ANAEROBIC Blood Culture results may not be optimal due to an excessive volume of blood received in culture bottles   Culture NO GROWTH 4 DAYS  Final   Report Status PENDING  Incomplete  Blood Culture (routine x 2)     Status: None (Preliminary result)   Collection Time: 04/28/17 10:09 AM  Result Value Ref Range Status   Specimen Description BLOOD BLOOD RIGHT WRIST  Final   Special Requests   Final    BOTTLES DRAWN AEROBIC AND ANAEROBIC Blood Culture adequate volume   Culture NO GROWTH 4 DAYS  Final   Report Status PENDING  Incomplete  MRSA PCR Screening     Status: None   Collection Time: 04/28/17  3:52 PM  Result Value Ref Range Status   MRSA by PCR NEGATIVE NEGATIVE Final    Comment:        The GeneXpert MRSA Assay (FDA approved for NASAL specimens only), is one component of a comprehensive MRSA colonization surveillance program. It is not intended to diagnose MRSA infection nor to guide or monitor treatment for MRSA infections.   Urine  culture     Status: None   Collection Time: 04/28/17  8:34 PM  Result Value Ref Range Status   Specimen Description URINE, RANDOM  Final   Special Requests NONE  Final   Culture   Final    NO GROWTH Performed at [facility], [address],  [place] [address]    Report Status 04/30/2017 FINAL  Final    RADIOLOGY:  No results found.   Management plans discussed with the patient, family and they are in agreement.  CODE STATUS: Prior   TOTAL TIME TAKING CARE OF THIS PATIENT: 33 minutes.    Demetrios Loll M.D on 05/02/2017 at 4:05 PM  Between 7am to 6pm - Pager - [phone]  After 6pm go to [email or web address] - Proofreader  [facility]  Office  [phone]  CC: Primary care physician; Gayland Curry, MD   Note: This dictation was prepared with Dragon dictation along with smaller phrase technology. Any transcriptional errors that result from this process are unintentional.

## 2017-05-02 NOTE — Discharge Instructions (Signed)
No smoking. augmentin po for 2 more days.

## 2017-05-02 NOTE — Care Management Important Message (Signed)
Important Message  Patient Details  Name: Roberta [name] MRN: [id number] Date of Birth: July 13, 1945   Medicare Important Message Given:  Yes    Shelbie Ammons, RN 05/02/2017, 7:19 AM

## 2017-05-02 NOTE — Plan of Care (Signed)
Pt was admitted on 12/1 for sepsis - tachycardia and hypotension.  She has chronic COPD, Stage 3 Lung ca with active tx with chemo and radiation (last chemo 2 wks ago); AAA w/3 stents placed in July.  WBC elevated but she's been getting neupogen.  Pt wked with PT very steady.  No c/o pain or any other symptoms.  Ready for d/c.  Sister, with whom she lives is here to take her home after review of d/c paperwork and f/u appts.  Will hep lock port before deaccessing.

## 2017-05-02 NOTE — Care Management Note (Signed)
Case Management Note  Patient Details  Name: JAYDENCE VANYO MRN: [id number] Date of Birth: 1946-04-21  Subjective/Objective: Admitted to [facility] with the diagnosis of sepsis, history of lung cancer. Lives alone. Granddaughter is [name] [phone]). Seen Dr. Astrid Divine in [place] last Tuesday. Prescriptions are filled at [facility] in [place]. Home Health in July following stent placements. (abdomin).   Doesn't remember name of agency. "They came from [facility]."  No skilled nursing. No home oxygen. Does have a nebulizer in the home. Takes care of all basic activities of daily living herself, can drive, if needed.    Sisters have been helping with errands. No falls. Appetite has been up and down. No weight lost.  Discharge to home today ?               Action/Plan: Physical therapy evaluation completed. No follow-up needs   Expected Discharge Date:                  Expected Discharge Plan:     In-House Referral:     Discharge planning Services     Post Acute Care Choice:    Choice offered to:     DME Arranged:    DME Agency:     HH Arranged:    HH Agency:     Status of Service:     If discussed at [employer], dates discussed:    Additional Comments:  Shelbie Ammons, RN MSN CCM Care Management [phone] 05/02/2017, 10:17 AM

## 2017-05-02 NOTE — Evaluation (Signed)
Physical Therapy Evaluation Patient Details Name: MINHA FULCO MRN: [id number] DOB: [birth year] Today's Date: 05/02/2017   History of Present Illness  Pt is a 71 y.o. female presenting to hospital with fatigue and near syncope; of note pt with recent chemo and radiation therapy for SCLC stage III.  Pt admitted 04/28/17 with sepsis with severe hypotension, neutropenic, thrombocytopenia, and s/p 2 units PRBC's secondary to anemia.  PMH includes SCLC stage III, COPD, AAA, L breast CA s/p mastectomy.  Clinical Impression  Prior to hospital admission, pt was independent with functional mobility.  Pt lives with her sister in an apt (no stairs).  Currently pt is independent with bed mobility, transfers, and ambulation around nursing loop.  Pt's HR 119 bpm and O2 92% on room air at rest and with ambulation HR increased to 137 bpm and O2 decreased to 89% on room air (improved to HR 130 bpm and O2 93% end of session resting in bed); nursing notified regarding above vitals.  Upon hospital discharge, recommend pt discharge to home; no acute PT needs identified during hospitalization or upon discharge home.  Will discharge pt from PT in house and will complete current PT order: please re-consult PT if pt's status changes and acute PT needs are identified.    Follow Up Recommendations No PT follow up    Equipment Recommendations  None recommended by PT    Recommendations for Other Services       Precautions / Restrictions Precautions Precautions: Fall Precaution Comments: L chest port Restrictions Weight Bearing Restrictions: No      Mobility  Bed Mobility Overal bed mobility: Independent             General bed mobility comments: Supine to/from sit with bed flat without any difficulties.  Transfers Overall transfer level: Independent Equipment used: None             General transfer comment: Sit to/from stand and stand step turn no AD steady and  safe  Ambulation/Gait Ambulation/Gait assistance: Independent Ambulation Distance (Feet): 230 Feet Assistive device: None Gait Pattern/deviations: WFL(Within Functional Limits)   Gait velocity interpretation: at or above normal speed for age/gender General Gait Details: steady ambulation; mild SOB with distance  Stairs Stairs: (Deferred (pt does not have stairs at home))          Wheelchair Mobility    Modified Rankin (Stroke Patients Only)       Balance Overall balance assessment: No apparent balance deficits (not formally assessed)(No loss of balance with ambulation and head turns R/L/up/down, increasing/decreasing speed, and turning and stopping; steady with static standing balance feet shoulder width apart and feet together x10 seconds both (steady without loss of balance))                                           Pertinent Vitals/Pain Pain Assessment: No/denies pain    Home Living Family/patient expects to be discharged to:: Private residence Living Arrangements: Other relatives(Pt's sister) Available Help at Discharge: Family Type of Home: Apartment(1st level) Home Access: Level entry     Home Layout: One level Home Equipment: Grab bars - tub/shower;Grab bars - toilet      Prior Function Level of Independence: Independent         Comments: Pt denies any falls in past 6 months.     Hand Dominance        Extremity/Trunk  Assessment   Upper Extremity Assessment Upper Extremity Assessment: Overall WFL for tasks assessed    Lower Extremity Assessment Lower Extremity Assessment: Overall WFL for tasks assessed    Cervical / Trunk Assessment Cervical / Trunk Assessment: Normal  Communication   Communication: No difficulties  Cognition Arousal/Alertness: Awake/alert Behavior During Therapy: WFL for tasks assessed/performed Overall Cognitive Status: Within Functional Limits for tasks assessed                                         General Comments General comments (skin integrity, edema, etc.): Pt resting in bed upon PT entry.  Nursing cleared pt for participation in physical therapy.  Pt agreeable to PT session.    Exercises     Assessment/Plan    PT Assessment Patent does not need any further PT services  PT Problem List         PT Treatment Interventions      PT Goals (Current goals can be found in the Care Plan section)  Acute Rehab PT Goals Patient Stated Goal: to go home PT Goal Formulation: With patient Time For Goal Achievement: 05/16/17 Potential to Achieve Goals: Good    Frequency     Barriers to discharge        Co-evaluation               AM-PAC PT "6 Clicks" Daily Activity  Outcome Measure Difficulty turning over in bed (including adjusting bedclothes, sheets and blankets)?: None Difficulty moving from lying on back to sitting on the side of the bed? : None Difficulty sitting down on and standing up from a chair with arms (e.g., wheelchair, bedside commode, etc,.)?: None Help needed moving to and from a bed to chair (including a wheelchair)?: None Help needed walking in hospital room?: None Help needed climbing 3-5 steps with a railing? : None 6 Click Score: 24    End of Session Equipment Utilized During Treatment: Gait belt Activity Tolerance: Patient tolerated treatment well Patient left: in bed Nurse Communication: Mobility status;Precautions;Other (comment)(Pt's HR and O2 status during session.) PT Visit Diagnosis: Other abnormalities of gait and mobility (R26.89)    Time: 2751-7001 PT Time Calculation (min) (ACUTE ONLY): 12 min   Charges:   PT Evaluation $PT Eval Low Complexity: 1 Low     PT G Codes:   PT G-Codes **NOT FOR INPATIENT CLASS** Functional Assessment Tool Used: AM-PAC 6 Clicks Basic Mobility Functional Limitation: Mobility: Walking and moving around Mobility: Walking and Moving Around Current Status (V4944): 0 percent impaired,  limited or restricted Mobility: Walking and Moving Around Goal Status (H6759): 0 percent impaired, limited or restricted Mobility: Walking and Moving Around Discharge Status (F6384): 0 percent impaired, limited or restricted    Leitha Bleak, PT 05/02/17, 10:08 AM [phone]

## 2017-05-03 ENCOUNTER — Other Ambulatory Visit: Payer: Self-pay | Admitting: Family Medicine

## 2017-05-03 DIAGNOSIS — Z780 Asymptomatic menopausal state: Secondary | ICD-10-CM

## 2017-05-03 LAB — CULTURE, BLOOD (ROUTINE X 2)
Culture: NO GROWTH
Culture: NO GROWTH
SPECIAL REQUESTS: ADEQUATE

## 2017-05-04 ENCOUNTER — Other Ambulatory Visit: Payer: Self-pay | Admitting: *Deleted

## 2017-05-04 ENCOUNTER — Telehealth: Payer: Self-pay | Admitting: *Deleted

## 2017-05-04 DIAGNOSIS — C3411 Malignant neoplasm of upper lobe, right bronchus or lung: Secondary | ICD-10-CM

## 2017-05-04 NOTE — Telephone Encounter (Signed)
Called patient and left a message on her voicemail letting her know that we were canceling her appointment for chemo on December 10.  We have rescheduled her an appointment just to see the doctor and have labs December 14 at 1030 and she also has made arrangements that if she needs fluids it will be given to her on the same day.  I have asked patient if she could call and confirm that she got my message and she will be here December 14 and that she knows not to come to [facility] on  December 10 for chemo because she is not recuperated from being in hospital with neutropenia

## 2017-05-04 NOTE — Telephone Encounter (Signed)
-----   Message from Festus Holts sent at 05/03/2017 11:29 AM EST ----- Regarding: Lab/MD/IVF with Potassium As discussed earlier, Patient is scheduled for Friday 05/11/17 @ 10:30a for Lab/MD/ 2 hrs IVF with poss Potassium here at [place].  Please advise patient.   Verdis Frederickson ----- Message ----- From: Luella Cook, RN Sent: 05/03/2017  10:14 AM To: Festus Holts  I checked with Janese Banks and she may need 2 hours for infusion for the potassium.  thanks ----- Message ----- From: Sindy Guadeloupe, MD Sent: 05/03/2017   9:39 AM To: Luella Cook, RN, Festus Holts  I need to see her next week- cbc. Bmp mg. Phos. No chemo. Possible fludis and potassium  Judeen Hammans- can you please putt he labs in  Thanks, [place]

## 2017-05-07 ENCOUNTER — Ambulatory Visit: Payer: Medicare Other

## 2017-05-07 ENCOUNTER — Ambulatory Visit: Payer: Medicare Other | Admitting: Oncology

## 2017-05-07 ENCOUNTER — Other Ambulatory Visit: Payer: Medicare Other

## 2017-05-08 ENCOUNTER — Ambulatory Visit: Payer: Medicare Other

## 2017-05-09 ENCOUNTER — Ambulatory Visit: Payer: Medicare Other

## 2017-05-11 ENCOUNTER — Ambulatory Visit: Payer: Medicare Other

## 2017-05-11 ENCOUNTER — Ambulatory Visit: Payer: Medicare Other | Admitting: Oncology

## 2017-05-11 ENCOUNTER — Other Ambulatory Visit: Payer: Medicare Other

## 2017-05-14 ENCOUNTER — Inpatient Hospital Stay: Payer: Medicare Other | Attending: Oncology | Admitting: Oncology

## 2017-05-14 ENCOUNTER — Inpatient Hospital Stay: Payer: Medicare Other

## 2017-05-14 ENCOUNTER — Encounter: Payer: Self-pay | Admitting: Oncology

## 2017-05-14 VITALS — BP 104/60 | HR 100 | Temp 97.4°F | Resp 18

## 2017-05-14 VITALS — BP 91/60 | HR 125 | Temp 98.4°F | Wt 129.6 lb

## 2017-05-14 DIAGNOSIS — E871 Hypo-osmolality and hyponatremia: Secondary | ICD-10-CM

## 2017-05-14 DIAGNOSIS — Z9012 Acquired absence of left breast and nipple: Secondary | ICD-10-CM | POA: Diagnosis not present

## 2017-05-14 DIAGNOSIS — J449 Chronic obstructive pulmonary disease, unspecified: Secondary | ICD-10-CM | POA: Diagnosis not present

## 2017-05-14 DIAGNOSIS — Z5111 Encounter for antineoplastic chemotherapy: Secondary | ICD-10-CM

## 2017-05-14 DIAGNOSIS — F17210 Nicotine dependence, cigarettes, uncomplicated: Secondary | ICD-10-CM

## 2017-05-14 DIAGNOSIS — E876 Hypokalemia: Secondary | ICD-10-CM | POA: Diagnosis not present

## 2017-05-14 DIAGNOSIS — I714 Abdominal aortic aneurysm, without rupture: Secondary | ICD-10-CM | POA: Insufficient documentation

## 2017-05-14 DIAGNOSIS — Z853 Personal history of malignant neoplasm of breast: Secondary | ICD-10-CM

## 2017-05-14 DIAGNOSIS — E7800 Pure hypercholesterolemia, unspecified: Secondary | ICD-10-CM | POA: Diagnosis not present

## 2017-05-14 DIAGNOSIS — C3411 Malignant neoplasm of upper lobe, right bronchus or lung: Secondary | ICD-10-CM | POA: Insufficient documentation

## 2017-05-14 DIAGNOSIS — Z79899 Other long term (current) drug therapy: Secondary | ICD-10-CM | POA: Diagnosis not present

## 2017-05-14 DIAGNOSIS — I700 Atherosclerosis of aorta: Secondary | ICD-10-CM | POA: Insufficient documentation

## 2017-05-14 LAB — CBC WITH DIFFERENTIAL/PLATELET
BASOS ABS: 0.1 10*3/uL (ref 0–0.1)
BASOS PCT: 2 %
EOS ABS: 0 10*3/uL (ref 0–0.7)
EOS PCT: 0 %
HCT: 29.4 % — ABNORMAL LOW (ref 35.0–47.0)
HEMOGLOBIN: 9.6 g/dL — AB (ref 12.0–16.0)
LYMPHS ABS: 1.1 10*3/uL (ref 1.0–3.6)
Lymphocytes Relative: 17 %
MCH: 29.5 pg (ref 26.0–34.0)
MCHC: 32.6 g/dL (ref 32.0–36.0)
MCV: 90.5 fL (ref 80.0–100.0)
Monocytes Absolute: 0.7 10*3/uL (ref 0.2–0.9)
Monocytes Relative: 10 %
NEUTROS PCT: 71 %
Neutro Abs: 4.7 10*3/uL (ref 1.4–6.5)
PLATELETS: 484 10*3/uL — AB (ref 150–440)
RBC: 3.25 MIL/uL — AB (ref 3.80–5.20)
RDW: 19.6 % — ABNORMAL HIGH (ref 11.5–14.5)
WBC: 6.6 10*3/uL (ref 3.6–11.0)

## 2017-05-14 LAB — BASIC METABOLIC PANEL
Anion gap: 9 (ref 5–15)
BUN: 14 mg/dL (ref 6–20)
CALCIUM: 8.5 mg/dL — AB (ref 8.9–10.3)
CO2: 29 mmol/L (ref 22–32)
CREATININE: 0.68 mg/dL (ref 0.44–1.00)
Chloride: 101 mmol/L (ref 101–111)
Glucose, Bld: 172 mg/dL — ABNORMAL HIGH (ref 65–99)
Potassium: 3.4 mmol/L — ABNORMAL LOW (ref 3.5–5.1)
SODIUM: 139 mmol/L (ref 135–145)

## 2017-05-14 LAB — MAGNESIUM: MAGNESIUM: 1.2 mg/dL — AB (ref 1.7–2.4)

## 2017-05-14 LAB — PHOSPHORUS: PHOSPHORUS: 3.3 mg/dL (ref 2.5–4.6)

## 2017-05-14 MED ORDER — SODIUM CHLORIDE 0.9 % IV SOLN
60.0000 mg/m2 | Freq: Once | INTRAVENOUS | Status: AC
  Administered 2017-05-14: 103 mg via INTRAVENOUS
  Filled 2017-05-14: qty 103

## 2017-05-14 MED ORDER — SODIUM CHLORIDE 0.9 % IV SOLN
Freq: Once | INTRAVENOUS | Status: AC
  Administered 2017-05-14: 12:00:00 via INTRAVENOUS
  Filled 2017-05-14: qty 1000

## 2017-05-14 MED ORDER — PALONOSETRON HCL INJECTION 0.25 MG/5ML
0.2500 mg | Freq: Once | INTRAVENOUS | Status: AC
  Administered 2017-05-14: 0.25 mg via INTRAVENOUS
  Filled 2017-05-14: qty 5

## 2017-05-14 MED ORDER — HEPARIN SOD (PORK) LOCK FLUSH 100 UNIT/ML IV SOLN
500.0000 [IU] | Freq: Once | INTRAVENOUS | Status: AC | PRN
  Administered 2017-05-14: 500 [IU]
  Filled 2017-05-14: qty 5

## 2017-05-14 MED ORDER — SODIUM CHLORIDE 0.9 % IV SOLN
100.0000 mg/m2 | Freq: Once | INTRAVENOUS | Status: AC
  Administered 2017-05-14: 170 mg via INTRAVENOUS
  Filled 2017-05-14: qty 8.5

## 2017-05-14 MED ORDER — DEXAMETHASONE SODIUM PHOSPHATE 10 MG/ML IJ SOLN: 10.0000 mg | Freq: Once | INTRAMUSCULAR | Status: DC

## 2017-05-14 MED ORDER — MAGNESIUM SULFATE 2 GM/50ML IV SOLN
2.0000 g | Freq: Once | INTRAVENOUS | Status: AC
  Administered 2017-05-14: 2 g via INTRAVENOUS
  Filled 2017-05-14: qty 50

## 2017-05-14 MED ORDER — SODIUM CHLORIDE 0.9% FLUSH
10.0000 mL | INTRAVENOUS | Status: DC | PRN
  Administered 2017-05-14: 10 mL
  Filled 2017-05-14: qty 10

## 2017-05-14 MED ORDER — SODIUM CHLORIDE 0.9 % IV SOLN
Freq: Once | INTRAVENOUS | Status: AC
  Administered 2017-05-14: 12:00:00 via INTRAVENOUS
  Filled 2017-05-14: qty 5

## 2017-05-14 MED ORDER — POTASSIUM CHLORIDE 2 MEQ/ML IV SOLN
Freq: Once | INTRAVENOUS | Status: AC
  Administered 2017-05-14: 10:00:00 via INTRAVENOUS
  Filled 2017-05-14: qty 10

## 2017-05-14 MED ORDER — SODIUM CHLORIDE 0.9 % IV SOLN
Freq: Once | INTRAVENOUS | Status: AC
  Administered 2017-05-14: 15:00:00 via INTRAVENOUS
  Filled 2017-05-14: qty 1000

## 2017-05-14 MED ORDER — POTASSIUM CHLORIDE 20 MEQ/100ML IV SOLN
20.0000 meq | Freq: Once | INTRAVENOUS | Status: AC
  Administered 2017-05-14: 20 meq via INTRAVENOUS

## 2017-05-14 NOTE — Progress Notes (Signed)
Patient here for follow up with labs and possible IV fluids/potassium today. She was discharged from the hospital last Wednesday. She states that she is feeling better and getting her strength back. She is drinking 2 Ensure drinks a day, and eats a small meal twice a day.

## 2017-05-14 NOTE — Progress Notes (Signed)
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 Patient is a 71 year old female with a long-standing history of smoking and smoked 1 pack per day for more than 50 years and is now down to half a pack per day. She also has COPD and recently had abdominal aortic aneurysm status post stenting. She presented with symptoms of mild hemoptysis in August 2018 which prompted CT chest  2. CT chest on 01/23/2017 showed:IMPRESSION: 1. Large right suprahilar mass extending into the mediastinum consistent with primary lung carcinoma with mediastinal extension. 2. Partial atelectasis of right upper lobe due to this large suprahilar mass with probable postobstructive pneumonitis in the right upper lobe peripherally. 3. Small right pleural effusion. 4. Severe thoracic aortic atherosclerosis. Diffuse coronary artery Calcifications.   3. This was followed by a PET CT scan on 02/07/2017 showed:IMPRESSION: 1. Hypermetabolic disease in the upper right hilum and mediastinum compatible with neoplasm. 2. Mild FDG accumulation in peripheral areas of patchy airspace disease which may be secondary sites of neoplasm or related to infection/inflammation. 3. No evidence for distant metastases in the neck, abdomen, or pelvis.   4. Patient underwent bronchoscopy guided biopsy with Dr. Mortimer Fries. Pathology showed small cell lung cancer. MRI brain negative for metastatic disease  5. Patient lives with her  sister and is independent of her ADLs. She reports occasional blood-tinged sputum but no significant frank hemoptysis. Her appetite is good and she denies any unintentional weight loss. She denies any pain. She does have a history of breast cancer in 2002 status post left mastectomy followed by adjuvant chemotherapy. This was treated by Dr. Oliva Bustard. She did not receive any adjuvant radiation.  6. Cycle 1 of cis/etoposide started on 03/05/17.  Scans after 2 cycles showed excellent response to treatment    Interval history-patient was admitted for neutropenic fever and generalized weakness after 3 cycles of chemotherapy and recently discharged from the hospital on 05/02/2017.  She still feels tired but is able to carry on with her ADLs.  She has not had any dizzy spells or falls  ECOG PS- 1 Pain scale- 0 Opioid associated constipation- no  Review of systems- Review of Systems  Constitutional: Positive for malaise/fatigue. Negative for chills, fever and weight loss.  HENT: Negative for congestion, ear discharge and nosebleeds.   Eyes: Negative for blurred vision.  Respiratory: Negative for cough, hemoptysis, sputum production, shortness of breath and wheezing.   Cardiovascular: Negative for chest pain, palpitations, orthopnea and claudication.  Gastrointestinal: Negative for abdominal pain, blood in stool, constipation, diarrhea, heartburn, melena, nausea and vomiting.  Genitourinary: Negative for dysuria, flank pain, frequency, hematuria and urgency.  Musculoskeletal: Negative for back pain, joint pain and myalgias.  Skin: Negative for rash.  Neurological: Negative for dizziness, tingling, focal weakness, seizures, weakness and headaches.  Endo/Heme/Allergies: Does not bruise/bleed easily.  Psychiatric/Behavioral: Negative for depression and suicidal ideas. The patient does not have insomnia.        No Known Allergies   Past Medical History:  Diagnosis Date  . AAA (abdominal aortic  aneurysm) ([place])   .  Breast cancer ([place]) 2002   left  . COPD (chronic obstructive pulmonary disease) ([place])   . High cholesterol   . Personal history of chemotherapy   . Small cell lung cancer ([place])   . Tobacco abuse      Past Surgical History:  Procedure Laterality Date  . ABDOMINAL HYSTERECTOMY    . ENDOBRONCHIAL ULTRASOUND N/A 02/19/2017   Procedure: ENDOBRONCHIAL ULTRASOUND;  Surgeon: Flora Lipps, MD;  Location: [facility] ORS;  Service: Cardiopulmonary;  Laterality: N/A;  . ENDOVASCULAR REPAIR/STENT GRAFT N/A 12/06/2016   Procedure: Endovascular Repair/Stent Graft;  Surgeon: Katha Cabal, MD;  Location: [facility];  Service: Cardiovascular;  Laterality: N/A;  . IR FLUORO GUIDE PORT INSERTION RIGHT  03/02/2017  . MASTECTOMY Left 2002   with sentinel node     Social History   Socioeconomic History  . Marital status: Widowed    Spouse name: Not on file  . Number of children: Not on file  . Years of education: Not on file  . Highest education level: Not on file  Social Needs  . Financial resource strain: Not on file  . Food insecurity - worry: Not on file  . Food insecurity - inability: Not on file  . Transportation needs - medical: Not on file  . Transportation needs - non-medical: Not on file  Occupational History  . Not on file  Tobacco Use  . Smoking status: Current Every Day Smoker    Packs/day: 0.50    Types: Cigarettes  . Smokeless tobacco: Never Used  Substance and Sexual Activity  . Alcohol use: No  . Drug use: No  . Sexual activity: No  Other Topics Concern  . Not on file  Social History Narrative  . Not on file    Family History  Problem Relation Age of Onset  . Breast cancer Neg Hx      Current Outpatient Medications:  .  albuterol (PROVENTIL HFA;VENTOLIN HFA) 108 (90 Base) MCG/ACT inhaler, Inhale 2 puffs into the lungs every 6 (six) hours as needed for wheezing or shortness of breath., Disp: 1 Inhaler, Rfl: 1 .  aspirin EC 81 MG  tablet, Take 81 mg by mouth daily., Disp: , Rfl:  .  atorvastatin (LIPITOR) 10 MG tablet, Take 10 mg by mouth at bedtime. , Disp: , Rfl:  .  dexamethasone (DECADRON) 4 MG tablet, Take 2 tablets two times a day for 1 day on day 4 after cisplatin chemotherapy. Take with food., Disp: 30 tablet, Rfl: 1 .  fluticasone-salmeterol (ADVAIR HFA) 115-21 MCG/ACT inhaler, Inhale 2 puffs into the lungs 2 (two) times daily., Disp: , Rfl:  .  guaifenesin (ROBITUSSIN) 100 MG/5ML syrup, Take 10 mLs (200 mg total) by mouth 3 (three) times daily as needed for cough., Disp: 118 mL, Rfl: 0 .  ipratropium (ATROVENT) 0.02 % nebulizer solution, Take 0.5 mg by nebulization 4 (four) times daily., Disp: , Rfl:  .  lidocaine-prilocaine (EMLA) cream, Apply to affected area once, Disp: 30 g, Rfl: 3 .  LORazepam (ATIVAN) 0.5 MG tablet, Take 1 tablet (0.5 mg total) by mouth every 6 (six) hours as needed (Nausea or vomiting)., Disp: 30 tablet, Rfl: 0 .  nystatin (MYCOSTATIN) 100000 UNIT/ML suspension, Use as directed 5 mLs in the mouth or throat 4 (four) times daily. Swish and swallow, Disp: , Rfl:  .  ondansetron (ZOFRAN) 8 MG tablet, Take 1 tablet (8 mg total) by mouth 2 (two) times daily as needed. Start on the third day after  cisplatin chemotherapy., Disp: 30 tablet, Rfl: 1 .  oxyCODONE (OXY IR/ROXICODONE) 5 MG immediate release tablet, Take 1 tablet (5 mg total) by mouth every 6 (six) hours as needed for severe pain. (Patient not taking: Reported on 04/28/2017), Disp: 30 tablet, Rfl: 0 .  prochlorperazine (COMPAZINE) 10 MG tablet, Take 1 tablet (10 mg total) by mouth every 6 (six) hours as needed (Nausea or vomiting). (Patient not taking: Reported on 04/28/2017), Disp: 30 tablet, Rfl: 1 .  sucralfate (CARAFATE) 1 g tablet, Take 1 tablet (1 g total) 4 (four) times daily -  with meals and at bedtime by mouth. dissolve in 8 oz of water, Disp: 90 tablet, Rfl: 0 No current facility-administered medications for this visit.    Facility-Administered Medications Ordered in Other Visits:  .  sodium chloride flush (NS) 0.9 % injection 10 mL, 10 mL, Intravenous, PRN, Sindy Guadeloupe, MD, 10 mL at 03/26/17 0948 .  sodium chloride flush (NS) 0.9 % injection 10 mL, 10 mL, Intravenous, PRN, Sindy Guadeloupe, MD, 10 mL at 04/16/17 1006  Physical exam:  Vitals:   05/14/17 0851  BP: 91/60  Pulse: (!) 125  Temp: 98.4 F (36.9 C)  TempSrc: Tympanic  Weight: 129 lb 10.1 oz (58.8 kg)   Physical Exam  Constitutional: She is oriented to person, place, and time.  Thin elderly female who appears mildly fatigued  HENT:  Head: Normocephalic and atraumatic.  Eyes: EOM are normal. Pupils are equal, round, and reactive to light.  Neck: Normal range of motion.  Cardiovascular: Regular rhythm and normal heart sounds.  Tachycardic  Pulmonary/Chest: Effort normal and breath sounds normal.  Abdominal: Soft. Bowel sounds are normal.  Neurological: She is alert and oriented to person, place, and time.  Skin: Skin is warm and dry.     CMP Latest Ref Rng & Units 05/02/2017  Glucose 65 - 99 mg/dL 86  BUN 6 - 20 mg/dL 5(L)  Creatinine 0.44 - 1.00 mg/dL 0.56  Sodium 135 - 145 mmol/L 140  Potassium 3.5 - 5.1 mmol/L 3.5  Chloride 101 - 111 mmol/L 102  CO2 22 - 32 mmol/L 29  Calcium 8.9 - 10.3 mg/dL 8.0(L)  Total Protein 6.5 - 8.1 g/dL 4.7(L)  Total Bilirubin 0.3 - 1.2 mg/dL 0.3  Alkaline Phos 38 - 126 U/L 117  AST 15 - 41 U/L 23  ALT 14 - 54 U/L 14   CBC Latest Ref Rng & Units 05/02/2017  WBC 3.6 - 11.0 K/uL 24.1(H)  Hemoglobin 12.0 - 16.0 g/dL 9.1(L)  Hematocrit 35.0 - 47.0 % 27.3(L)  Platelets 150 - 440 K/uL 71(L)    No images are attached to the encounter.  Dg Chest Port 1 View  Result Date: 04/28/2017 CLINICAL DATA:  Known right upper lobe small cell lung cancer, fever EXAM: PORTABLE CHEST 1 VIEW COMPARISON:  04/06/2017 FINDINGS: Left IJ power port catheter tip SVC RA junction. Ill-defined right hilar mass noted but  better demonstrated by CT comparison. Biapical pleural-parenchymal scarring and nodularity appears grossly stable. Mild background stable interstitial prominence. No superimposed pneumonia, significant collapse or consolidation. Negative for CHF or edema. No large effusion or pneumothorax. Trachea is midline. Aorta is atherosclerotic. Bones are osteopenic. IMPRESSION: Stable right upper lobe hilar mass and chronic changes as above. No definite superimposed acute process or edema. No large effusion or pneumothorax. Electronically Signed   By: Jerilynn Mages.  Shick M.D.   On: 04/28/2017 10:34     Assessment and plan- Patient is a 70 y.o.  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  Carboplatin is more myelosuppressive I will hold off on switching her to carboplatin at this time.  Her hearing loss has not worsened and she is able to carry on day-to-day conversations without significant issues.  I will plan to give her on pro-Neulasta on day 3 of etoposide.  Hypokalemia/hypomagnesemia: We will give 20 mEq of additional IV potassium as well as 2 g of IV magnesium today along with 1 L IV fluids.  She will return to clinic in 1 week time for possible fluids and electrolytes.  Repeat CBC and CMP in 2 weeks time followed by CT chest abdomen and pelvis with contrast to assess response to treatment after 4 cycles of chemotherapy and radiation.  If she has stable disease a partial response I will refer her back to radiation oncology to discuss prophylactic cranial irradiation.  I do plan to stop treatment after this cycle    Visit Diagnosis 1. Small cell lung cancer, right upper lobe ([place])   2. Encounter for antineoplastic chemotherapy   3. Hypokalemia   4. Hypomagnesemia      Dr. Randa Evens, MD, MPH [facility] Pager- [phone] 05/14/2017 11:43 AM

## 2017-05-15 ENCOUNTER — Inpatient Hospital Stay: Payer: Medicare Other

## 2017-05-15 VITALS — BP 107/58 | HR 95 | Temp 97.7°F | Resp 18

## 2017-05-15 DIAGNOSIS — C3411 Malignant neoplasm of upper lobe, right bronchus or lung: Secondary | ICD-10-CM

## 2017-05-15 DIAGNOSIS — Z5111 Encounter for antineoplastic chemotherapy: Secondary | ICD-10-CM | POA: Diagnosis not present

## 2017-05-15 MED ORDER — SODIUM CHLORIDE 0.9 % IV SOLN
Freq: Once | INTRAVENOUS | Status: AC
  Administered 2017-05-15: 14:00:00 via INTRAVENOUS
  Filled 2017-05-15: qty 1000

## 2017-05-15 MED ORDER — SODIUM CHLORIDE 0.9% FLUSH
10.0000 mL | INTRAVENOUS | Status: DC | PRN
  Administered 2017-05-15: 10 mL
  Filled 2017-05-15: qty 10

## 2017-05-15 MED ORDER — DEXAMETHASONE SODIUM PHOSPHATE 10 MG/ML IJ SOLN
10.0000 mg | Freq: Once | INTRAMUSCULAR | Status: AC
  Administered 2017-05-15: 10 mg via INTRAVENOUS

## 2017-05-15 MED ORDER — HEPARIN SOD (PORK) LOCK FLUSH 100 UNIT/ML IV SOLN
500.0000 [IU] | Freq: Once | INTRAVENOUS | Status: AC | PRN
  Administered 2017-05-15: 500 [IU]
  Filled 2017-05-15: qty 5

## 2017-05-15 MED ORDER — SODIUM CHLORIDE 0.9 % IV SOLN
100.0000 mg/m2 | Freq: Once | INTRAVENOUS | Status: AC
  Administered 2017-05-15: 170 mg via INTRAVENOUS
  Filled 2017-05-15: qty 8.5

## 2017-05-16 ENCOUNTER — Inpatient Hospital Stay: Payer: Medicare Other

## 2017-05-16 VITALS — BP 125/68 | HR 91 | Temp 97.5°F | Resp 18

## 2017-05-16 DIAGNOSIS — C3411 Malignant neoplasm of upper lobe, right bronchus or lung: Secondary | ICD-10-CM

## 2017-05-16 DIAGNOSIS — Z5111 Encounter for antineoplastic chemotherapy: Secondary | ICD-10-CM | POA: Diagnosis not present

## 2017-05-16 MED ORDER — SODIUM CHLORIDE 0.9 % IV SOLN
Freq: Once | INTRAVENOUS | Status: AC
  Administered 2017-05-16: 13:00:00 via INTRAVENOUS
  Filled 2017-05-16: qty 1000

## 2017-05-16 MED ORDER — DEXAMETHASONE SODIUM PHOSPHATE 10 MG/ML IJ SOLN
INTRAMUSCULAR | Status: AC
  Filled 2017-05-16: qty 1

## 2017-05-16 MED ORDER — HEPARIN SOD (PORK) LOCK FLUSH 100 UNIT/ML IV SOLN
500.0000 [IU] | Freq: Once | INTRAVENOUS | Status: AC | PRN
Start: 2017-05-16 — End: 2017-05-16
  Administered 2017-05-16: 500 [IU]
  Filled 2017-05-16: qty 5

## 2017-05-16 MED ORDER — SODIUM CHLORIDE 0.9% FLUSH
10.0000 mL | INTRAVENOUS | Status: DC | PRN
  Administered 2017-05-16: 10 mL
  Filled 2017-05-16: qty 10

## 2017-05-16 MED ORDER — DEXAMETHASONE SODIUM PHOSPHATE 10 MG/ML IJ SOLN
10.0000 mg | Freq: Once | INTRAMUSCULAR | Status: AC
  Administered 2017-05-16: 10 mg via INTRAVENOUS

## 2017-05-16 MED ORDER — SODIUM CHLORIDE 0.9 % IV SOLN
100.0000 mg/m2 | Freq: Once | INTRAVENOUS | Status: AC
  Administered 2017-05-16: 170 mg via INTRAVENOUS
  Filled 2017-05-16: qty 8.5

## 2017-05-21 ENCOUNTER — Other Ambulatory Visit: Payer: Self-pay | Admitting: Oncology

## 2017-05-21 ENCOUNTER — Inpatient Hospital Stay: Payer: Medicare Other

## 2017-05-21 VITALS — BP 86/48 | HR 84 | Temp 97.7°F | Resp 18

## 2017-05-21 DIAGNOSIS — Z95828 Presence of other vascular implants and grafts: Secondary | ICD-10-CM

## 2017-05-21 DIAGNOSIS — Z5111 Encounter for antineoplastic chemotherapy: Secondary | ICD-10-CM | POA: Diagnosis not present

## 2017-05-21 DIAGNOSIS — C3411 Malignant neoplasm of upper lobe, right bronchus or lung: Secondary | ICD-10-CM

## 2017-05-21 LAB — BASIC METABOLIC PANEL
ANION GAP: 12 (ref 5–15)
BUN: 24 mg/dL — ABNORMAL HIGH (ref 6–20)
CALCIUM: 8.7 mg/dL — AB (ref 8.9–10.3)
CHLORIDE: 98 mmol/L — AB (ref 101–111)
CO2: 27 mmol/L (ref 22–32)
CREATININE: 0.85 mg/dL (ref 0.44–1.00)
GFR calc non Af Amer: >60 mL/min (ref >60)
Glucose, Bld: 133 mg/dL — ABNORMAL HIGH (ref 65–99)
Potassium: 3.7 mmol/L (ref 3.5–5.1)
SODIUM: 137 mmol/L (ref 135–145)

## 2017-05-21 LAB — MAGNESIUM: Magnesium: 1.3 mg/dL — ABNORMAL LOW (ref 1.7–2.4)

## 2017-05-21 MED ORDER — SODIUM CHLORIDE 0.9% FLUSH
10.0000 mL | INTRAVENOUS | Status: DC | PRN
  Administered 2017-05-21: 10 mL via INTRAVENOUS
  Filled 2017-05-21: qty 10

## 2017-05-21 MED ORDER — HEPARIN SOD (PORK) LOCK FLUSH 100 UNIT/ML IV SOLN
500.0000 [IU] | Freq: Once | INTRAVENOUS | Status: AC
  Administered 2017-05-21: 500 [IU] via INTRAVENOUS
  Filled 2017-05-21: qty 5

## 2017-05-21 MED ORDER — SODIUM CHLORIDE 0.9 % IV SOLN
Freq: Once | INTRAVENOUS | Status: AC
  Administered 2017-05-21: 09:00:00 via INTRAVENOUS
  Filled 2017-05-21: qty 1000

## 2017-05-21 MED ORDER — MAGNESIUM SULFATE 2 GM/50ML IV SOLN
2.0000 g | Freq: Once | INTRAVENOUS | Status: AC
  Administered 2017-05-21: 2 g via INTRAVENOUS
  Filled 2017-05-21: qty 50

## 2017-05-28 ENCOUNTER — Other Ambulatory Visit: Payer: Self-pay | Admitting: *Deleted

## 2017-05-28 ENCOUNTER — Inpatient Hospital Stay: Payer: Medicare Other

## 2017-05-28 VITALS — BP 90/56 | HR 85 | Temp 97.6°F | Resp 18

## 2017-05-28 DIAGNOSIS — Z5111 Encounter for antineoplastic chemotherapy: Secondary | ICD-10-CM | POA: Diagnosis not present

## 2017-05-28 DIAGNOSIS — E876 Hypokalemia: Secondary | ICD-10-CM

## 2017-05-28 DIAGNOSIS — C3411 Malignant neoplasm of upper lobe, right bronchus or lung: Secondary | ICD-10-CM

## 2017-05-28 LAB — CBC WITH DIFFERENTIAL/PLATELET
Basophils Absolute: 0 10*3/uL (ref 0–0.1)
Basophils Relative: 0 %
EOS PCT: 1 %
Eosinophils Absolute: 0 10*3/uL (ref 0–0.7)
HEMATOCRIT: 24.8 % — AB (ref 35.0–47.0)
Hemoglobin: 8 g/dL — ABNORMAL LOW (ref 12.0–16.0)
LYMPHS ABS: 0.9 10*3/uL — AB (ref 1.0–3.6)
LYMPHS PCT: 29 %
MCH: 29.7 pg (ref 26.0–34.0)
MCHC: 32.2 g/dL (ref 32.0–36.0)
MCV: 92.2 fL (ref 80.0–100.0)
MONO ABS: 0.7 10*3/uL (ref 0.2–0.9)
Monocytes Relative: 21 %
Neutro Abs: 1.5 10*3/uL (ref 1.4–6.5)
Neutrophils Relative %: 49 %
PLATELETS: 174 10*3/uL (ref 150–440)
RBC: 2.69 MIL/uL — AB (ref 3.80–5.20)
RDW: 20.4 % — AB (ref 11.5–14.5)
WBC: 3.1 10*3/uL — ABNORMAL LOW (ref 3.6–11.0)

## 2017-05-28 LAB — BASIC METABOLIC PANEL
Anion gap: 9 (ref 5–15)
BUN: 17 mg/dL (ref 6–20)
CHLORIDE: 98 mmol/L — AB (ref 101–111)
CO2: 28 mmol/L (ref 22–32)
Calcium: 8.3 mg/dL — ABNORMAL LOW (ref 8.9–10.3)
Creatinine, Ser: 0.7 mg/dL (ref 0.44–1.00)
GFR calc Af Amer: >60 mL/min (ref >60)
GFR calc non Af Amer: >60 mL/min (ref >60)
GLUCOSE: 162 mg/dL — AB (ref 65–99)
POTASSIUM: 3.2 mmol/L — AB (ref 3.5–5.1)
Sodium: 135 mmol/L (ref 135–145)

## 2017-05-28 LAB — MAGNESIUM: Magnesium: 1.2 mg/dL — ABNORMAL LOW (ref 1.7–2.4)

## 2017-05-28 MED ORDER — POTASSIUM CHLORIDE 20 MEQ/100ML IV SOLN
20.0000 meq | Freq: Once | INTRAVENOUS | Status: AC
  Administered 2017-05-28: 20 meq via INTRAVENOUS

## 2017-05-28 MED ORDER — SODIUM CHLORIDE 0.9 % IV SOLN
Freq: Once | INTRAVENOUS | Status: AC
Start: 2017-05-28 — End: 2017-05-28
  Administered 2017-05-28: 10:00:00 via INTRAVENOUS
  Filled 2017-05-28: qty 1000

## 2017-05-28 MED ORDER — HEPARIN SOD (PORK) LOCK FLUSH 100 UNIT/ML IV SOLN
500.0000 [IU] | Freq: Once | INTRAVENOUS | Status: AC
  Administered 2017-05-28: 500 [IU] via INTRAVENOUS
  Filled 2017-05-28: qty 5

## 2017-05-28 MED ORDER — MAGNESIUM SULFATE 2 GM/50ML IV SOLN
2.0000 g | Freq: Once | INTRAVENOUS | Status: AC
  Administered 2017-05-28: 2 g via INTRAVENOUS
  Filled 2017-05-28: qty 50

## 2017-05-28 MED ORDER — POTASSIUM CHLORIDE 20 MEQ/100ML IV SOLN
INTRAVENOUS | Status: AC
  Filled 2017-05-28: qty 100

## 2017-05-28 MED ORDER — POTASSIUM CHLORIDE 2 MEQ/ML IV SOLN
40.0000 meq | Freq: Once | INTRAVENOUS | Status: DC
Start: 2017-05-28 — End: 2017-05-28

## 2017-05-28 MED ORDER — SODIUM CHLORIDE 0.9% FLUSH
10.0000 mL | INTRAVENOUS | Status: DC | PRN
  Administered 2017-05-28: 10 mL via INTRAVENOUS
  Filled 2017-05-28: qty 10

## 2017-05-28 NOTE — Patient Instructions (Signed)
Hypokalemia Hypokalemia means that the amount of potassium in the blood is lower than normal.Potassium is a chemical that helps regulate the amount of fluid in the body (electrolyte). It also stimulates muscle tightening (contraction) and helps nerves work properly.Normally, most of the body's potassium is inside of cells, and only a very small amount is in the blood. Because the amount in the blood is so small, minor changes to potassium levels in the blood can be life-threatening. What are the causes? This condition may be caused by:  Antibiotic medicine.  Diarrhea or vomiting. Taking too much of a medicine that helps you have a bowel movement (laxative) can cause diarrhea and lead to hypokalemia.  Chronic kidney disease (CKD).  Medicines that help the body get rid of excess fluid (diuretics).  Eating disorders, such as bulimia.  Low magnesium levels in the body.  Sweating a lot.  What are the signs or symptoms? Symptoms of this condition include:  Weakness.  Constipation.  Fatigue.  Muscle cramps.  Mental confusion.  Skipped heartbeats or irregular heartbeat (palpitations).  Tingling or numbness.  How is this diagnosed? This condition is diagnosed with a blood test. How is this treated? Hypokalemia can be treated by taking potassium supplements by mouth or adjusting the medicines that you take. Treatment may also include eating more foods that contain a lot of potassium. If your potassium level is very low, you may need to get potassium through an IV tube in one of your veins and be monitored in the hospital. Follow these instructions at home:  Take over-the-counter and prescription medicines only as told by your health care provider. This includes vitamins and supplements.  Eat a healthy diet. A healthy diet includes fresh fruits and vegetables, whole grains, healthy fats, and lean proteins.  If instructed, eat more foods that contain a lot of potassium, such  as: ? Nuts, such as peanuts and pistachios. ? Seeds, such as sunflower seeds and pumpkin seeds. ? Peas, lentils, and lima beans. ? Whole grain and bran cereals and breads. ? Fresh fruits and vegetables, such as apricots, avocado, bananas, cantaloupe, kiwi, oranges, tomatoes, asparagus, and potatoes. ? Orange juice. ? Tomato juice. ? Red meats. ? Yogurt.  Keep all follow-up visits as told by your health care provider. This is important. Contact a health care provider if:  You have weakness that gets worse.  You feel your heart pounding or racing.  You vomit.  You have diarrhea.  You have diabetes (diabetes mellitus) and you have trouble keeping your blood sugar (glucose) in your target range. Get help right away if:  You have chest pain.  You have shortness of breath.  You have vomiting or diarrhea that lasts for more than 2 days.  You faint. This information is not intended to replace advice given to you by your health care provider. Make sure you discuss any questions you have with your health care provider. Document Released: 05/15/2005 Document Revised: 01/01/2016 Document Reviewed: 01/01/2016 Elsevier Interactive Patient Education  2018 [employer]. Hypomagnesemia Hypomagnesemia is a condition in which the level of magnesium in the blood is low. Magnesium is a mineral that is found in many foods. It is used in many different processes in the body. Hypomagnesemia can affect every organ in the body. It can cause life-threatening problems. What are the causes? Causes of hypomagnesemia include:  Not getting enough magnesium in your diet.  Malnutrition.  Problems with absorbing magnesium from the intestines.  Dehydration.  Alcohol abuse.  Vomiting.  Severe diarrhea.  Some medicines, including medicines that make you urinate more.  Certain diseases, such as kidney disease, diabetes, and overactive thyroid.  What are the signs or symptoms?  Involuntary  shaking or trembling of a body part (tremor).  Confusion.  Muscle weakness.  Sensitivity to light, sound, and touch.  Psychiatric issues, such as depression, irritability, or psychosis.  Sudden tightening of muscles (muscle spasms).  Tingling in the arms and legs.  A feeling of fluttering of the heart. These symptoms are more severe if magnesium levels drop suddenly. How is this diagnosed? To make a diagnosis, your health care provider will do a physical exam and order blood and urine tests. How is this treated? Treatment will depend on the cause and the severity of your condition. It may involve:  A magnesium supplement. This can be taken in pill form. It can also be given through an IV tube. This is usually done if the condition is severe.  Changes to your diet. You may be directed to eat foods that have a lot of magnesium, such as green leafy vegetables, peas, beans, and nuts.  Eliminating alcohol from your diet.  Follow these instructions at home:  Include foods with magnesium in your diet. Foods that are rich in magnesium include green vegetables, beans, nuts and seeds, and whole grains.  Take medicines only as directed by your health care provider.  Take magnesium supplements if your health care provider instructs you to do that. Take them as directed.  Have your magnesium levels monitored as directed by your health care provider.  When you are active, drink fluids that contain electrolytes.  Keep all follow-up visits as directed by your health care provider. This is important. Contact a health care provider if:  You get worse instead of better.  Your symptoms return. Get help right away if:  Your symptoms are severe. This information is not intended to replace advice given to you by your health care provider. Make sure you discuss any questions you have with your health care provider. Document Released: 02/08/2005 Document Revised: 10/21/2015 Document Reviewed:  12/29/2013 Elsevier Interactive Patient Education  2018 [employer].

## 2017-05-30 ENCOUNTER — Ambulatory Visit
Admission: RE | Admit: 2017-05-30 | Discharge: 2017-05-30 | Disposition: A | Discharge status: Home / Self Care | Payer: Medicare Other | Source: Ambulatory Visit | Attending: Oncology | Admitting: Oncology

## 2017-05-30 DIAGNOSIS — Z95828 Presence of other vascular implants and grafts: Secondary | ICD-10-CM | POA: Diagnosis not present

## 2017-05-30 DIAGNOSIS — R918 Other nonspecific abnormal finding of lung field: Secondary | ICD-10-CM | POA: Insufficient documentation

## 2017-05-30 DIAGNOSIS — Z9889 Other specified postprocedural states: Secondary | ICD-10-CM | POA: Insufficient documentation

## 2017-05-30 DIAGNOSIS — I714 Abdominal aortic aneurysm, without rupture: Secondary | ICD-10-CM | POA: Insufficient documentation

## 2017-05-30 DIAGNOSIS — C3411 Malignant neoplasm of upper lobe, right bronchus or lung: Secondary | ICD-10-CM | POA: Diagnosis not present

## 2017-05-30 MED ORDER — IOPAMIDOL (ISOVUE-300) INJECTION 61%
80.0000 mL | Freq: Once | INTRAVENOUS | Status: AC | PRN
  Administered 2017-05-30: 80 mL via INTRAVENOUS

## 2017-05-31 ENCOUNTER — Ambulatory Visit
Admission: RE | Admit: 2017-05-31 | Discharge: 2017-05-31 | Disposition: A | Discharge status: Home / Self Care | Payer: Medicare Other | Source: Ambulatory Visit | Attending: Radiation Oncology | Admitting: Radiation Oncology

## 2017-05-31 ENCOUNTER — Other Ambulatory Visit: Payer: Self-pay

## 2017-05-31 ENCOUNTER — Encounter: Payer: Self-pay | Admitting: Radiation Oncology

## 2017-05-31 VITALS — BP 95/61 | HR 118 | Temp 98.2°F | Resp 20 | Wt 130.5 lb

## 2017-05-31 DIAGNOSIS — Z510 Encounter for antineoplastic radiation therapy: Secondary | ICD-10-CM | POA: Insufficient documentation

## 2017-05-31 DIAGNOSIS — Z923 Personal history of irradiation: Secondary | ICD-10-CM | POA: Diagnosis not present

## 2017-05-31 DIAGNOSIS — R5383 Other fatigue: Secondary | ICD-10-CM | POA: Insufficient documentation

## 2017-05-31 DIAGNOSIS — Z9221 Personal history of antineoplastic chemotherapy: Secondary | ICD-10-CM | POA: Insufficient documentation

## 2017-05-31 DIAGNOSIS — F17210 Nicotine dependence, cigarettes, uncomplicated: Secondary | ICD-10-CM | POA: Insufficient documentation

## 2017-05-31 DIAGNOSIS — C3401 Malignant neoplasm of right main bronchus: Secondary | ICD-10-CM

## 2017-05-31 NOTE — Progress Notes (Signed)
Radiation Oncology Follow up Note  Name: Roberta [name]   Date:   05/31/2017 MRN:  [id number] DOB: [birth year]    This 72 y.o. female presents to the clinic today for one-month follow-up status post concurrent chemoradiation for limited stage small cell lung cancer with consideration for PCI.  REFERRING PROVIDER: Gayland Curry, MD  HPI: Patient is a 72 year old female now 1 month out having completed radiation therapy with concurrent chemotherapy for limited stage IIIB (T3 N2 M0) small cell lung cancer. She is seen today in routine follow-up is doing fairly well still quite fatigued from her overall treatment. She is a mild nonproductive cough.. She had a CT scan of chest abdomen and pelvis yesterday showing continued improvement of the right upper lobe mass and right hilar mediastinal adenopathy. She does have some small ill-defined. Peribronchial vascular nodules in both lungs thought to be inflammatory. No other evidence of metastatic disease. She specifically denies headaches or change in visual fields.  COMPLICATIONS OF TREATMENT: none  FOLLOW UP COMPLIANCE: keeps appointments   PHYSICAL EXAM:  BP 95/61   Pulse (!) 118   Temp 98.2 F (36.8 C)   Resp 20   Wt 130 lb 8.2 oz (59.2 kg)   BMI 23.12 kg/m  Thin slightly cachectic female in NAD. Well-developed well-nourished patient in NAD. HEENT reveals PERLA, EOMI, discs not visualized.  Oral cavity is clear. No oral mucosal lesions are identified. Neck is clear without evidence of cervical or supraclavicular adenopathy. Lungs are clear to A&P. Cardiac examination is essentially unremarkable with regular rate and rhythm without murmur rub or thrill. Abdomen is benign with no organomegaly or masses noted. Motor sensory and DTR levels are equal and symmetric in the upper and lower extremities. Cranial nerves II through XII are grossly intact. Proprioception is intact. No peripheral adenopathy or edema is identified. No motor or sensory  levels are noted. Crude visual fields are within normal range.  RADIOLOGY RESULTS: CT scan is reviewed and compatible with the above-stated findings  PLAN: At this time if medical oncology is in agreement I like to wait 2 weeks and start PCI on the patient would plan on delivering 3000 cGy in 15 fractions. Risks and benefits of whole brain radiation including hair loss fatigue alteration of blood counts and slight chance of cognitive decline all were discussed in detail with the patient. I've tentatively set her up for CT simulation in about 2 weeks' time. Patient will be seeing medical oncology prior to that visit and will change her plans should they not agree with proceeding with PCI. Patient, his my rationale for whole brain radiation at this time.  I would like to take this opportunity to thank you for allowing me to participate in the care of your patient.Noreene Filbert, MD

## 2017-06-04 ENCOUNTER — Inpatient Hospital Stay: Payer: Medicare Other

## 2017-06-04 ENCOUNTER — Other Ambulatory Visit: Payer: Self-pay

## 2017-06-04 ENCOUNTER — Inpatient Hospital Stay: Payer: Medicare Other | Attending: Oncology | Admitting: Oncology

## 2017-06-04 ENCOUNTER — Encounter: Payer: Self-pay | Admitting: Oncology

## 2017-06-04 VITALS — BP 112/70 | HR 118 | Temp 98.2°F | Resp 20 | Wt 130.6 lb

## 2017-06-04 DIAGNOSIS — Z923 Personal history of irradiation: Secondary | ICD-10-CM | POA: Insufficient documentation

## 2017-06-04 DIAGNOSIS — D6481 Anemia due to antineoplastic chemotherapy: Secondary | ICD-10-CM

## 2017-06-04 DIAGNOSIS — Z853 Personal history of malignant neoplasm of breast: Secondary | ICD-10-CM | POA: Diagnosis not present

## 2017-06-04 DIAGNOSIS — Z9221 Personal history of antineoplastic chemotherapy: Secondary | ICD-10-CM | POA: Diagnosis not present

## 2017-06-04 DIAGNOSIS — F17210 Nicotine dependence, cigarettes, uncomplicated: Secondary | ICD-10-CM | POA: Diagnosis not present

## 2017-06-04 DIAGNOSIS — E876 Hypokalemia: Secondary | ICD-10-CM | POA: Insufficient documentation

## 2017-06-04 DIAGNOSIS — C3491 Malignant neoplasm of unspecified part of right bronchus or lung: Secondary | ICD-10-CM | POA: Diagnosis not present

## 2017-06-04 DIAGNOSIS — J90 Pleural effusion, not elsewhere classified: Secondary | ICD-10-CM | POA: Diagnosis not present

## 2017-06-04 DIAGNOSIS — T451X5A Adverse effect of antineoplastic and immunosuppressive drugs, initial encounter: Secondary | ICD-10-CM

## 2017-06-04 DIAGNOSIS — D649 Anemia, unspecified: Secondary | ICD-10-CM

## 2017-06-04 DIAGNOSIS — C3411 Malignant neoplasm of upper lobe, right bronchus or lung: Secondary | ICD-10-CM

## 2017-06-04 LAB — MAGNESIUM: Magnesium: 1.4 mg/dL — ABNORMAL LOW (ref 1.7–2.4)

## 2017-06-04 LAB — POTASSIUM: Potassium: 4.2 mmol/L (ref 3.5–5.1)

## 2017-06-04 MED ORDER — MAGNESIUM SULFATE 2 GM/50ML IV SOLN
2.0000 g | Freq: Once | INTRAVENOUS | Status: AC
  Administered 2017-06-04: 2 g via INTRAVENOUS

## 2017-06-04 MED ORDER — SODIUM CHLORIDE 0.9 % IV SOLN
Freq: Once | INTRAVENOUS | Status: AC
  Administered 2017-06-04: 11:00:00 via INTRAVENOUS
  Filled 2017-06-04: qty 1000

## 2017-06-04 MED ORDER — SODIUM CHLORIDE 0.9% FLUSH
10.0000 mL | INTRAVENOUS | Status: DC | PRN
  Administered 2017-06-04: 10 mL via INTRAVENOUS
  Filled 2017-06-04: qty 10

## 2017-06-04 MED ORDER — HEPARIN SOD (PORK) LOCK FLUSH 100 UNIT/ML IV SOLN
500.0000 [IU] | Freq: Once | INTRAVENOUS | Status: AC
  Administered 2017-06-04: 500 [IU] via INTRAVENOUS

## 2017-06-04 NOTE — Progress Notes (Signed)
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 Patient is a 72 year old female with a long-standing history of smoking and smoked 1 pack per day for more than 50 years and is now down to half a pack per day. She also has COPD and recently had abdominal aortic aneurysm status post stenting. She presented with symptoms of mild hemoptysis in August 2018 which prompted CT chest  2. CT chest on 01/23/2017 showed:IMPRESSION: 1. Large right suprahilar mass extending into the mediastinum consistent with primary lung carcinoma with mediastinal extension. 2. Partial atelectasis of right upper lobe due to this large suprahilar mass with probable postobstructive pneumonitis in the right upper lobe peripherally. 3. Small right pleural effusion. 4. Severe thoracic aortic atherosclerosis. Diffuse coronary artery Calcifications.   3. This was followed by a PET CT scan on 02/07/2017 showed:IMPRESSION: 1. Hypermetabolic disease in the upper right hilum and mediastinum compatible with neoplasm. 2. Mild FDG accumulation in peripheral areas of patchy airspace disease which may be secondary sites of neoplasm or related to infection/inflammation. 3. No evidence for distant metastases in the neck, abdomen, or pelvis.   4. Patient underwent bronchoscopy guided biopsy with Dr. Mortimer Fries. Pathology showed small cell lung cancer. MRI brain negative for metastatic disease  5. Patient lives with her sister and is independent of her ADLs. She  reports occasional blood-tinged sputum but no significant frank hemoptysis. Her appetite is good and she denies any unintentional weight loss. She denies any pain. She does have a history of breast cancer in 2002 status post left mastectomy followed by adjuvant chemotherapy. This was treated by Dr. Oliva Bustard. She did not receive any adjuvant radiation.  6. Cycle 1 of cis/etoposide started on 03/05/17.  Scans after 2 cycles showed excellent response to treatment. Scans after 4 cycles showed further decrease in the size of lung mass and emdiastinal adenopathy   Interval history- energy levels are improving and so is her appetite. She is drinking ensure and keeping up with PO and fluid intake  ECOG PS- 1 Pain scale- 0 Opioid associated constipation- no  Review of systems- Review of Systems  Constitutional: Positive for malaise/fatigue. Negative for chills, fever and weight loss.  HENT: Negative for congestion, ear discharge and nosebleeds.   Eyes: Negative for blurred vision.  Respiratory: Negative for cough, hemoptysis, sputum production, shortness of breath and wheezing.   Cardiovascular: Negative for chest pain, palpitations, orthopnea and claudication.  Gastrointestinal: Negative for abdominal pain, blood in stool, constipation, diarrhea, heartburn, melena, nausea and vomiting.  Genitourinary: Negative for dysuria, flank pain, frequency, hematuria and urgency.  Musculoskeletal: Negative for back pain, joint pain and myalgias.  Skin: Negative for rash.  Neurological: Negative for dizziness, tingling, focal weakness, seizures, weakness and headaches.  Endo/Heme/Allergies: Does not bruise/bleed easily.  Psychiatric/Behavioral: Negative for depression and suicidal ideas. The patient does not have insomnia.      No Known Allergies   Past Medical History:  Diagnosis Date  . AAA (abdominal aortic aneurysm) ([place])   . Breast cancer ([place]) 2002   left  . COPD (chronic obstructive pulmonary  disease) ([place])   .  High cholesterol   . Personal history of chemotherapy   . Small cell lung cancer ([place])   . Tobacco abuse      Past Surgical History:  Procedure Laterality Date  . ABDOMINAL HYSTERECTOMY    . ENDOBRONCHIAL ULTRASOUND N/A 02/19/2017   Procedure: ENDOBRONCHIAL ULTRASOUND;  Surgeon: Flora Lipps, MD;  Location: [facility] ORS;  Service: Cardiopulmonary;  Laterality: N/A;  . ENDOVASCULAR REPAIR/STENT GRAFT N/A 12/06/2016   Procedure: Endovascular Repair/Stent Graft;  Surgeon: Katha Cabal, MD;  Location: [place] [facility];  Service: Cardiovascular;  Laterality: N/A;  . IR FLUORO GUIDE PORT INSERTION RIGHT  03/02/2017  . MASTECTOMY Left 2002   with sentinel node     Social History   Socioeconomic History  . Marital status: Widowed    Spouse name: Not on file  . Number of children: Not on file  . Years of education: Not on file  . Highest education level: Not on file  Social Needs  . Financial resource strain: Not on file  . Food insecurity - worry: Not on file  . Food insecurity - inability: Not on file  . Transportation needs - medical: Not on file  . Transportation needs - non-medical: Not on file  Occupational History  . Not on file  Tobacco Use  . Smoking status: Current Every Day Smoker    Packs/day: 0.50    Types: Cigarettes  . Smokeless tobacco: Never Used  Substance and Sexual Activity  . Alcohol use: No  . Drug use: No  . Sexual activity: No  Other Topics Concern  . Not on file  Social History Narrative  . Not on file    Family History  Problem Relation Age of Onset  . Breast cancer Neg Hx      Current Outpatient Medications:  .  acetaminophen (TYLENOL) 325 MG tablet, Take 650 mg by mouth every 6 (six) hours as needed., Disp: , Rfl:  .  albuterol (PROVENTIL HFA;VENTOLIN HFA) 108 (90 Base) MCG/ACT inhaler, Inhale 2 puffs into the lungs every 6 (six) hours as needed for wheezing or shortness of breath., Disp: 1 Inhaler, Rfl: 1 .  aspirin  EC 81 MG tablet, Take 81 mg by mouth daily., Disp: , Rfl:  .  atorvastatin (LIPITOR) 10 MG tablet, Take 10 mg by mouth at bedtime. , Disp: , Rfl:  .  fluticasone-salmeterol (ADVAIR HFA) 115-21 MCG/ACT inhaler, Inhale 2 puffs into the lungs 2 (two) times daily., Disp: , Rfl:  .  guaifenesin (ROBITUSSIN) 100 MG/5ML syrup, Take 10 mLs (200 mg total) by mouth 3 (three) times daily as needed for cough., Disp: 118 mL, Rfl: 0 .  ipratropium (ATROVENT) 0.02 % nebulizer solution, Take 0.5 mg by nebulization 4 (four) times daily., Disp: , Rfl:  .  lidocaine-prilocaine (EMLA) cream, Apply to affected area once, Disp: 30 g, Rfl: 3 .  Multiple Vitamin (MULTIVITAMIN) tablet, Take 1 tablet by mouth daily., Disp: , Rfl:  .  dexamethasone (DECADRON) 4 MG tablet, Take 2 tablets two times a day for 1 day on day 4 after cisplatin chemotherapy. Take with food. (Patient not taking: Reported on 05/31/2017), Disp: 30 tablet, Rfl: 1 .  LORazepam (ATIVAN) 0.5 MG tablet, Take 1 tablet (0.5 mg total) by mouth every 6 (six) hours as needed (Nausea or vomiting). (Patient not taking: Reported on 06/04/2017), Disp: 30 tablet, Rfl: 0 .  nystatin (MYCOSTATIN) 100000 UNIT/ML suspension, Use as directed 5 mLs in the mouth or throat 4 (four) times daily. Swish and  swallow, Disp: , Rfl:  .  ondansetron (ZOFRAN) 8 MG tablet, Take 1 tablet (8 mg total) by mouth 2 (two) times daily as needed. Start on the third day after cisplatin chemotherapy. (Patient not taking: Reported on 05/31/2017), Disp: 30 tablet, Rfl: 1 .  oxyCODONE (OXY IR/ROXICODONE) 5 MG immediate release tablet, Take 1 tablet (5 mg total) by mouth every 6 (six) hours as needed for severe pain. (Patient not taking: Reported on 04/28/2017), Disp: 30 tablet, Rfl: 0 .  prochlorperazine (COMPAZINE) 10 MG tablet, Take 1 tablet (10 mg total) by mouth every 6 (six) hours as needed (Nausea or vomiting). (Patient not taking: Reported on 04/28/2017), Disp: 30 tablet, Rfl: 1 .  sucralfate (CARAFATE)  1 g tablet, Take 1 tablet (1 g total) 4 (four) times daily -  with meals and at bedtime by mouth. dissolve in 8 oz of water (Patient not taking: Reported on 05/31/2017), Disp: 90 tablet, Rfl: 0 No current facility-administered medications for this visit.   Facility-Administered Medications Ordered in Other Visits:  .  0.9 %  sodium chloride infusion, , Intravenous, Once, Sindy Guadeloupe, MD .  heparin lock flush 100 unit/mL, 500 Units, Intravenous, Once, Randa Evens C, MD .  magnesium sulfate IVPB 2 g 50 mL, 2 g, Intravenous, Once, Sindy Guadeloupe, MD, Last Rate: 50 mL/hr at 06/04/17 1044, 2 g at 06/04/17 1044 .  sodium chloride flush (NS) 0.9 % injection 10 mL, 10 mL, Intravenous, PRN, Sindy Guadeloupe, MD, 10 mL at 03/26/17 0948 .  sodium chloride flush (NS) 0.9 % injection 10 mL, 10 mL, Intravenous, PRN, Sindy Guadeloupe, MD, 10 mL at 04/16/17 1006 .  sodium chloride flush (NS) 0.9 % injection 10 mL, 10 mL, Intravenous, PRN, Sindy Guadeloupe, MD, 10 mL at 06/04/17 1010  Physical exam:  Vitals:   06/04/17 1018  BP: 112/70  Pulse: (!) 118  Resp: 20  Temp: 98.2 F (36.8 C)  TempSrc: Tympanic  Weight: 130 lb 10 oz (59.2 kg)   Physical Exam  Constitutional: She is oriented to person, place, and time.  Thin elderly frail woman in no acute distress  HENT:  Head: Normocephalic and atraumatic.  Eyes: EOM are normal. Pupils are equal, round, and reactive to light.  Neck: Normal range of motion.  Cardiovascular: Regular rhythm and normal heart sounds.  tachycardic  Pulmonary/Chest: Effort normal and breath sounds normal.  Abdominal: Soft. Bowel sounds are normal.  Neurological: She is alert and oriented to person, place, and time.  Skin: Skin is warm and dry.     CMP Latest Ref Rng & Units 06/04/2017  Glucose 65 - 99 mg/dL -  BUN 6 - 20 mg/dL -  Creatinine 0.44 - 1.00 mg/dL -  Sodium 135 - 145 mmol/L -  Potassium 3.5 - 5.1 mmol/L 4.2  Chloride 101 - 111 mmol/L -  CO2 22 - 32 mmol/L -    Calcium 8.9 - 10.3 mg/dL -  Total Protein 6.5 - 8.1 g/dL -  Total Bilirubin 0.3 - 1.2 mg/dL -  Alkaline Phos 38 - 126 U/L -  AST 15 - 41 U/L -  ALT 14 - 54 U/L -   CBC Latest Ref Rng & Units 05/28/2017  WBC 3.6 - 11.0 K/uL 3.1(L)  Hemoglobin 12.0 - 16.0 g/dL 8.0(L)  Hematocrit 35.0 - 47.0 % 24.8(L)  Platelets 150 - 440 K/uL 174    No images are attached to the encounter.  Ct Chest W Contrast  Result Date:  05/30/2017 CLINICAL DATA:  Small cell lung cancer post completion of the initial therapy (chemotherapy and radiation therapy). History of left-sided breast cancer. EXAM: CT CHEST, ABDOMEN, AND PELVIS WITH CONTRAST TECHNIQUE: Multidetector CT imaging of the chest, abdomen and pelvis was performed following the standard protocol during bolus administration of intravenous contrast. CONTRAST:  15mL ISOVUE-300 IOPAMIDOL (ISOVUE-300) INJECTION 61% COMPARISON:  CTs 04/06/2017. FINDINGS: CT CHEST FINDINGS Cardiovascular: There is atherosclerosis of the aorta, great vessels and coronary arteries. No acute vascular findings are demonstrated. Left IJ central venous catheter extends to the superior cavoatrial junction. The heart size is normal. There is no pericardial effusion. Mediastinum/Nodes: Further improvement in mediastinal and right hilar adenopathy. Right paratracheal node measures 8 mm short axis on image 21 (previously 14 mm). Right hilar nodes measure 12 x 10 mm (previously 16 mm) and 15 x 18 mm on image 24 (previously 20 x 24 mm). No new or enlarging lymph nodes are seen. The thyroid gland, trachea and esophagus demonstrate no significant findings. Lungs/Pleura: Trace pleural fluid on the right. Peripheral right upper lobe lesion has decreased in size, now measuring 15 x 11 mm on image 32 (previously 16 x 12 mm. There is underlying emphysema with diffuse central airway thickening, asymmetric to the right. There is some mucous plugging in the right upper lobe bronchi. Interval development of  multiple ill-defined peribronchial vascular nodules bilaterally. These are greatest within the peripheral aspects of the right upper and middle lobes. Largest component measures 7 x 10 mm in the right upper lobe (image 51). Musculoskeletal/Chest wall: No chest wall mass or suspicious osseous findings. Previous left mastectomy. CT ABDOMEN AND PELVIS FINDINGS Hepatobiliary: The liver is normal in density without focal abnormality. No evidence of gallstones, gallbladder wall thickening or biliary dilatation. Pancreas: Unremarkable. No pancreatic ductal dilatation or surrounding inflammatory changes. Spleen: Normal in size without focal abnormality. Adrenals/Urinary Tract: Both adrenal glands appear normal. The kidneys appear normal without evidence of urinary tract calculus, suspicious lesion or hydronephrosis. No bladder abnormalities are seen. Stomach/Bowel: No evidence of bowel wall thickening, distention or surrounding inflammatory change. Sigmoid colon diverticular changes. The appendix appears normal. Vascular/Lymphatic: There are no enlarged abdominal or pelvic lymph nodes. Stable appearance of thrombosed, excluded abdominal aortic aneurysm status post aortoiliac stent grafting. The iliac grafts are patent. There is a right renal stent which is patent. No acute vascular or significant venous abnormalities. Reproductive: The uterus and ovaries appear normal. No evidence of adnexal mass. Other: No evidence of abdominal wall mass or hernia. No ascites. Musculoskeletal: No acute or significant osseous findings. Mild degenerative changes in the spine. IMPRESSION: 1. Continued improvement in the right upper lobe mass, right hilar and mediastinal lymphadenopathy consistent with response to therapy. 2. Interval development of multiple small ill-defined peribronchovascular nodules in both lungs, greatest in the right upper lobe. Some of these are clustered, and are not associated with the bronchovascular bundles such  that lymphangitic tumor is unlikely. These are likely inflammatory. Attention on follow-up recommended. 3. No evidence of metastatic disease in the abdomen or pelvis. 4. Stable appearance of abdominal aortic aneurysm status post stent grafting. Electronically Signed   By: Richardean Sale M.D.   On: 05/30/2017 13:12   Ct Abdomen Pelvis W Contrast  Result Date: 05/30/2017 CLINICAL DATA:  Small cell lung cancer post completion of the initial therapy (chemotherapy and radiation therapy). History of left-sided breast cancer. EXAM: CT CHEST, ABDOMEN, AND PELVIS WITH CONTRAST TECHNIQUE: Multidetector CT imaging of the chest, abdomen and pelvis was  performed following the standard protocol during bolus administration of intravenous contrast. CONTRAST:  47mL ISOVUE-300 IOPAMIDOL (ISOVUE-300) INJECTION 61% COMPARISON:  CTs 04/06/2017. FINDINGS: CT CHEST FINDINGS Cardiovascular: There is atherosclerosis of the aorta, great vessels and coronary arteries. No acute vascular findings are demonstrated. Left IJ central venous catheter extends to the superior cavoatrial junction. The heart size is normal. There is no pericardial effusion. Mediastinum/Nodes: Further improvement in mediastinal and right hilar adenopathy. Right paratracheal node measures 8 mm short axis on image 21 (previously 14 mm). Right hilar nodes measure 12 x 10 mm (previously 16 mm) and 15 x 18 mm on image 24 (previously 20 x 24 mm). No new or enlarging lymph nodes are seen. The thyroid gland, trachea and esophagus demonstrate no significant findings. Lungs/Pleura: Trace pleural fluid on the right. Peripheral right upper lobe lesion has decreased in size, now measuring 15 x 11 mm on image 32 (previously 16 x 12 mm. There is underlying emphysema with diffuse central airway thickening, asymmetric to the right. There is some mucous plugging in the right upper lobe bronchi. Interval development of multiple ill-defined peribronchial vascular nodules bilaterally.  These are greatest within the peripheral aspects of the right upper and middle lobes. Largest component measures 7 x 10 mm in the right upper lobe (image 51). Musculoskeletal/Chest wall: No chest wall mass or suspicious osseous findings. Previous left mastectomy. CT ABDOMEN AND PELVIS FINDINGS Hepatobiliary: The liver is normal in density without focal abnormality. No evidence of gallstones, gallbladder wall thickening or biliary dilatation. Pancreas: Unremarkable. No pancreatic ductal dilatation or surrounding inflammatory changes. Spleen: Normal in size without focal abnormality. Adrenals/Urinary Tract: Both adrenal glands appear normal. The kidneys appear normal without evidence of urinary tract calculus, suspicious lesion or hydronephrosis. No bladder abnormalities are seen. Stomach/Bowel: No evidence of bowel wall thickening, distention or surrounding inflammatory change. Sigmoid colon diverticular changes. The appendix appears normal. Vascular/Lymphatic: There are no enlarged abdominal or pelvic lymph nodes. Stable appearance of thrombosed, excluded abdominal aortic aneurysm status post aortoiliac stent grafting. The iliac grafts are patent. There is a right renal stent which is patent. No acute vascular or significant venous abnormalities. Reproductive: The uterus and ovaries appear normal. No evidence of adnexal mass. Other: No evidence of abdominal wall mass or hernia. No ascites. Musculoskeletal: No acute or significant osseous findings. Mild degenerative changes in the spine. IMPRESSION: 1. Continued improvement in the right upper lobe mass, right hilar and mediastinal lymphadenopathy consistent with response to therapy. 2. Interval development of multiple small ill-defined peribronchovascular nodules in both lungs, greatest in the right upper lobe. Some of these are clustered, and are not associated with the bronchovascular bundles such that lymphangitic tumor is unlikely. These are likely inflammatory.  Attention on follow-up recommended. 3. No evidence of metastatic disease in the abdomen or pelvis. 4. Stable appearance of abdominal aortic aneurysm status post stent grafting. Electronically Signed   By: Richardean Sale M.D.   On: 05/30/2017 13:12     Assessment and plan- Patient is a 72 y.o. 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  Overall patient has had good partial response to treatment as evidenced by decrease in the size of her lung mass as well as mediastinal adenopathy.  At this point she will not receive any further chemotherapy.  She can proceed  with prophylactic cranial irradiation as planned with Dr. Donella Stade.  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  Today her magnesium levels are again low at 1.2.  She will received 2 g of IV magnesium and we will repeat her BMP and magnesium in 2 weeks time.  Her potassium levels  are normal today.  Chemo-induced anemia: Last H&H checked a week ago was 8/24.8.  Her baseline hemoglobin runs around 12.  Hopefully it should improve as she is not going to be receiving any further chemotherapy at this time.  Will repeat CBC with differential along with ferritin and iron studies as well as B12 and folate in 2 weeks time   Patient will also need a port flush every 6 weeks     Visit Diagnosis 1. Small cell lung cancer, right upper lobe ([place])   2. Hypokalemia   3. Hypomagnesemia      Dr. Randa Evens, MD, MPH [facility] Pager- [phone] 06/04/2017 11:03 AM

## 2017-06-04 NOTE — Progress Notes (Signed)
Here for follow up doing well per pt.

## 2017-06-11 ENCOUNTER — Ambulatory Visit
Admission: RE | Admit: 2017-06-11 | Discharge: 2017-06-11 | Disposition: A | Discharge status: Home / Self Care | Payer: Medicare Other | Source: Ambulatory Visit | Attending: Radiation Oncology | Admitting: Radiation Oncology

## 2017-06-11 ENCOUNTER — Other Ambulatory Visit: Payer: Self-pay | Admitting: *Deleted

## 2017-06-11 DIAGNOSIS — Z510 Encounter for antineoplastic radiation therapy: Secondary | ICD-10-CM | POA: Diagnosis not present

## 2017-06-11 MED ORDER — DEXAMETHASONE 4 MG PO TABS: 4.0000 mg | ORAL_TABLET | Freq: Every day | ORAL | 0 refills | Status: DC

## 2017-06-15 ENCOUNTER — Other Ambulatory Visit: Payer: Self-pay | Admitting: *Deleted

## 2017-06-18 ENCOUNTER — Ambulatory Visit: Payer: Medicare Other

## 2017-06-18 ENCOUNTER — Ambulatory Visit
Admission: RE | Admit: 2017-06-18 | Discharge: 2017-06-18 | Disposition: A | Discharge status: Home / Self Care | Payer: Medicare Other | Source: Ambulatory Visit | Attending: Radiation Oncology | Admitting: Radiation Oncology

## 2017-06-18 ENCOUNTER — Other Ambulatory Visit: Payer: Self-pay | Admitting: *Deleted

## 2017-06-18 ENCOUNTER — Telehealth: Payer: Self-pay | Admitting: *Deleted

## 2017-06-18 ENCOUNTER — Inpatient Hospital Stay: Payer: Medicare Other

## 2017-06-18 DIAGNOSIS — T451X5A Adverse effect of antineoplastic and immunosuppressive drugs, initial encounter: Secondary | ICD-10-CM

## 2017-06-18 DIAGNOSIS — D6481 Anemia due to antineoplastic chemotherapy: Secondary | ICD-10-CM

## 2017-06-18 DIAGNOSIS — C3491 Malignant neoplasm of unspecified part of right bronchus or lung: Secondary | ICD-10-CM | POA: Diagnosis not present

## 2017-06-18 DIAGNOSIS — C3411 Malignant neoplasm of upper lobe, right bronchus or lung: Secondary | ICD-10-CM

## 2017-06-18 DIAGNOSIS — Z510 Encounter for antineoplastic radiation therapy: Secondary | ICD-10-CM | POA: Diagnosis not present

## 2017-06-18 DIAGNOSIS — D649 Anemia, unspecified: Secondary | ICD-10-CM

## 2017-06-18 LAB — IRON AND TIBC
Iron: 34 ug/dL (ref 28–170)
Saturation Ratios: 14 % (ref 10.4–31.8)
TIBC: 251 ug/dL (ref 250–450)
UIBC: 217 ug/dL

## 2017-06-18 LAB — CBC WITH DIFFERENTIAL/PLATELET
BASOS PCT: 1 %
Basophils Absolute: 0.1 10*3/uL (ref 0–0.1)
Eosinophils Absolute: 0 10*3/uL (ref 0–0.7)
Eosinophils Relative: 1 %
HEMATOCRIT: 30.3 % — AB (ref 35.0–47.0)
Hemoglobin: 10.1 g/dL — ABNORMAL LOW (ref 12.0–16.0)
Lymphocytes Relative: 16 %
Lymphs Abs: 1.4 10*3/uL (ref 1.0–3.6)
MCH: 31.5 pg (ref 26.0–34.0)
MCHC: 33.3 g/dL (ref 32.0–36.0)
MCV: 94.4 fL (ref 80.0–100.0)
Monocytes Absolute: 0.8 10*3/uL (ref 0.2–0.9)
Monocytes Relative: 9 %
NEUTROS ABS: 6.9 10*3/uL — AB (ref 1.4–6.5)
Neutrophils Relative %: 75 %
Platelets: 473 10*3/uL — ABNORMAL HIGH (ref 150–440)
RBC: 3.21 MIL/uL — ABNORMAL LOW (ref 3.80–5.20)
RDW: 19.3 % — ABNORMAL HIGH (ref 11.5–14.5)
WBC: 9.2 10*3/uL (ref 3.6–11.0)

## 2017-06-18 LAB — FOLATE: FOLATE: 43 ng/mL (ref >5.9)

## 2017-06-18 LAB — FERRITIN: Ferritin: 187 ng/mL (ref 11–307)

## 2017-06-18 LAB — POTASSIUM: Potassium: 3.9 mmol/L (ref 3.5–5.1)

## 2017-06-18 LAB — VITAMIN B12: Vitamin B-12: 433 pg/mL (ref 180–914)

## 2017-06-18 LAB — MAGNESIUM: MAGNESIUM: 1.7 mg/dL (ref 1.7–2.4)

## 2017-06-18 MED ORDER — SODIUM CHLORIDE 0.9% FLUSH
10.0000 mL | INTRAVENOUS | Status: DC | PRN
  Administered 2017-06-18: 10 mL via INTRAVENOUS
  Filled 2017-06-18: qty 10

## 2017-06-18 MED ORDER — HYDROCODONE-HOMATROPINE 5-1.5 MG/5ML PO SYRP: 5.0000 mL | ORAL_SOLUTION | Freq: Four times a day (QID) | ORAL | 0 refills | Status: DC | PRN

## 2017-06-18 MED ORDER — BENZONATATE 200 MG PO CAPS: 200.0000 mg | ORAL_CAPSULE | Freq: Three times a day (TID) | ORAL | 0 refills | Status: DC | PRN

## 2017-06-18 MED ORDER — HEPARIN SOD (PORK) LOCK FLUSH 100 UNIT/ML IV SOLN
500.0000 [IU] | Freq: Once | INTRAVENOUS | Status: AC
  Administered 2017-06-18: 500 [IU] via INTRAVENOUS
  Filled 2017-06-18: qty 5

## 2017-06-18 NOTE — Telephone Encounter (Signed)
SW okay'd Getting drugs under [employer]. I called patient to inform her. She told me that is okay. Her sister has agreed to pay out of pocket for patient to get medicines this afternoon at [facility].

## 2017-06-18 NOTE — Telephone Encounter (Signed)
Pt here is clinic today with coughing. She states she has been taking robitussin DM.  I checked with Dr. Janese Banks ans she would like her to try hycodan and tessalon pearls.  Gave her rx for the hycodan and sent rx for the tessalon pearls.  Advised her to not take together and could take them at least 2 hours apart.  Pt says she understands. She will pick them up today.

## 2017-06-18 NOTE — Telephone Encounter (Signed)
I called [facility] pharmacy in [place] and spoke to [place] who told me the Benzonatate and the Hycodan cough syrup that patient was prescribed today by Dr Janese Banks  are not covered by Medicare, None of the cough medicines are covered, as these are viewed as a "nuisance meds " not treatments according to Medicare. The 200mg  tessalin perles are $111.00/ 90 day supply vs 100mg  /90 day supply is $53.00. RN is checking with SW to see if one of the [place] funds can cover the expense of these drugs for patient.

## 2017-06-19 ENCOUNTER — Ambulatory Visit
Admission: RE | Admit: 2017-06-19 | Discharge: 2017-06-19 | Disposition: A | Discharge status: Home / Self Care | Payer: Medicare Other | Source: Ambulatory Visit | Attending: Radiation Oncology | Admitting: Radiation Oncology

## 2017-06-19 DIAGNOSIS — Z510 Encounter for antineoplastic radiation therapy: Secondary | ICD-10-CM | POA: Diagnosis not present

## 2017-06-20 ENCOUNTER — Ambulatory Visit
Admission: RE | Admit: 2017-06-20 | Discharge: 2017-06-20 | Disposition: A | Discharge status: Home / Self Care | Payer: Medicare Other | Source: Ambulatory Visit | Attending: Radiation Oncology | Admitting: Radiation Oncology

## 2017-06-20 DIAGNOSIS — Z510 Encounter for antineoplastic radiation therapy: Secondary | ICD-10-CM | POA: Diagnosis not present

## 2017-06-21 ENCOUNTER — Ambulatory Visit
Admission: RE | Admit: 2017-06-21 | Discharge: 2017-06-21 | Disposition: A | Discharge status: Home / Self Care | Payer: Medicare Other | Source: Ambulatory Visit | Attending: Radiation Oncology | Admitting: Radiation Oncology

## 2017-06-21 DIAGNOSIS — Z510 Encounter for antineoplastic radiation therapy: Secondary | ICD-10-CM | POA: Diagnosis not present

## 2017-06-22 ENCOUNTER — Ambulatory Visit
Admission: RE | Admit: 2017-06-22 | Discharge: 2017-06-22 | Disposition: A | Discharge status: Home / Self Care | Payer: Medicare Other | Source: Ambulatory Visit | Attending: Radiation Oncology | Admitting: Radiation Oncology

## 2017-06-22 DIAGNOSIS — Z510 Encounter for antineoplastic radiation therapy: Secondary | ICD-10-CM | POA: Diagnosis not present

## 2017-06-25 ENCOUNTER — Ambulatory Visit
Admission: RE | Admit: 2017-06-25 | Discharge: 2017-06-25 | Disposition: A | Discharge status: Home / Self Care | Payer: Medicare Other | Source: Ambulatory Visit | Attending: Radiation Oncology | Admitting: Radiation Oncology

## 2017-06-25 DIAGNOSIS — Z510 Encounter for antineoplastic radiation therapy: Secondary | ICD-10-CM | POA: Diagnosis not present

## 2017-06-26 ENCOUNTER — Ambulatory Visit
Admission: RE | Admit: 2017-06-26 | Discharge: 2017-06-26 | Disposition: A | Discharge status: Home / Self Care | Payer: Medicare Other | Source: Ambulatory Visit | Attending: Radiation Oncology | Admitting: Radiation Oncology

## 2017-06-26 DIAGNOSIS — Z510 Encounter for antineoplastic radiation therapy: Secondary | ICD-10-CM | POA: Diagnosis not present

## 2017-06-27 ENCOUNTER — Ambulatory Visit
Admission: RE | Admit: 2017-06-27 | Discharge: 2017-06-27 | Disposition: A | Discharge status: Home / Self Care | Payer: Medicare Other | Source: Ambulatory Visit | Attending: Radiation Oncology | Admitting: Radiation Oncology

## 2017-06-27 DIAGNOSIS — Z510 Encounter for antineoplastic radiation therapy: Secondary | ICD-10-CM | POA: Diagnosis not present

## 2017-06-28 ENCOUNTER — Other Ambulatory Visit: Payer: Self-pay

## 2017-06-28 ENCOUNTER — Telehealth: Payer: Self-pay | Admitting: *Deleted

## 2017-06-28 ENCOUNTER — Ambulatory Visit
Admission: RE | Admit: 2017-06-28 | Discharge: 2017-06-28 | Disposition: A | Discharge status: Home / Self Care | Payer: Medicare Other | Source: Ambulatory Visit | Attending: Radiation Oncology | Admitting: Radiation Oncology

## 2017-06-28 ENCOUNTER — Emergency Department
Admission: EM | Admit: 2017-06-28 | Discharge: 2017-06-28 | Disposition: A | Discharge status: Home / Self Care | Payer: Medicare Other | Attending: Emergency Medicine | Admitting: Emergency Medicine

## 2017-06-28 DIAGNOSIS — R42 Dizziness and giddiness: Secondary | ICD-10-CM | POA: Diagnosis present

## 2017-06-28 DIAGNOSIS — Z7982 Long term (current) use of aspirin: Secondary | ICD-10-CM | POA: Diagnosis not present

## 2017-06-28 DIAGNOSIS — Z510 Encounter for antineoplastic radiation therapy: Secondary | ICD-10-CM | POA: Diagnosis not present

## 2017-06-28 DIAGNOSIS — J449 Chronic obstructive pulmonary disease, unspecified: Secondary | ICD-10-CM | POA: Diagnosis not present

## 2017-06-28 DIAGNOSIS — R05 Cough: Secondary | ICD-10-CM | POA: Insufficient documentation

## 2017-06-28 DIAGNOSIS — F17210 Nicotine dependence, cigarettes, uncomplicated: Secondary | ICD-10-CM | POA: Diagnosis not present

## 2017-06-28 DIAGNOSIS — Z79899 Other long term (current) drug therapy: Secondary | ICD-10-CM | POA: Insufficient documentation

## 2017-06-28 LAB — BASIC METABOLIC PANEL
ANION GAP: 10 (ref 5–15)
BUN: 17 mg/dL (ref 6–20)
CO2: 24 mmol/L (ref 22–32)
Calcium: 9.1 mg/dL (ref 8.9–10.3)
Chloride: 98 mmol/L — ABNORMAL LOW (ref 101–111)
Creatinine, Ser: 0.76 mg/dL (ref 0.44–1.00)
GFR calc Af Amer: >60 mL/min (ref >60)
GFR calc non Af Amer: >60 mL/min (ref >60)
GLUCOSE: 120 mg/dL — AB (ref 65–99)
Potassium: 4 mmol/L (ref 3.5–5.1)
Sodium: 132 mmol/L — ABNORMAL LOW (ref 135–145)

## 2017-06-28 LAB — URINALYSIS, COMPLETE (UACMP) WITH MICROSCOPIC
BACTERIA UA: NONE SEEN
Bilirubin Urine: NEGATIVE
Glucose, UA: NEGATIVE mg/dL
HGB URINE DIPSTICK: NEGATIVE
KETONES UR: NEGATIVE mg/dL
Leukocytes, UA: NEGATIVE
NITRITE: NEGATIVE
PROTEIN: NEGATIVE mg/dL
Specific Gravity, Urine: 1.016 (ref 1.005–1.030)
pH: 7 (ref 5.0–8.0)

## 2017-06-28 LAB — CBC
HEMATOCRIT: 35.2 % (ref 35.0–47.0)
HEMOGLOBIN: 11.7 g/dL — AB (ref 12.0–16.0)
MCH: 31.9 pg (ref 26.0–34.0)
MCHC: 33.1 g/dL (ref 32.0–36.0)
MCV: 96.4 fL (ref 80.0–100.0)
Platelets: 375 10*3/uL (ref 150–440)
RBC: 3.66 MIL/uL — ABNORMAL LOW (ref 3.80–5.20)
RDW: 18.6 % — ABNORMAL HIGH (ref 11.5–14.5)
WBC: 11 10*3/uL (ref 3.6–11.0)

## 2017-06-28 LAB — TROPONIN I: Troponin I: <0.03 ng/mL (ref <0.03)

## 2017-06-28 MED ORDER — MECLIZINE HCL 25 MG PO TABS: 25.0000 mg | ORAL_TABLET | Freq: Three times a day (TID) | ORAL | 0 refills | Status: DC | PRN

## 2017-06-28 MED ORDER — MECLIZINE HCL 25 MG PO TABS
25.0000 mg | ORAL_TABLET | Freq: Once | ORAL | Status: AC
  Administered 2017-06-28: 25 mg via ORAL
  Filled 2017-06-28: qty 1

## 2017-06-28 MED ORDER — SODIUM CHLORIDE 0.9 % IV BOLUS (SEPSIS)
500.0000 mL | Freq: Once | INTRAVENOUS | Status: AC
  Administered 2017-06-28: 500 mL via INTRAVENOUS

## 2017-06-28 NOTE — ED Notes (Addendum)
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 She does have a porta cath but would prefer peripheral IV. Pt vomited this am. Now she is tired, weak and dizzy. Has an appetite and had been eating an drinking well

## 2017-06-28 NOTE — ED Notes (Signed)
Pt placed on bed pan by this tech

## 2017-06-28 NOTE — ED Triage Notes (Signed)
Pt states she is taking radiation and that she started with dizziness yesterday and a productive cough

## 2017-06-28 NOTE — ED Notes (Signed)
Informed RN that patient has been roomed and is ready for evaluation.  Patient in NAD at this time and call bell placed within reach.   

## 2017-06-28 NOTE — ED Provider Notes (Signed)
[facility] Emergency Department Provider Note   ____________________________________________   I have reviewed the triage vital signs and the nursing notes.   HISTORY  Chief Complaint Dizziness and Cough   History limited by: Not Limited   HPI Roberta [name] is a 72 y.o. female who presents to the emergency department today with concerns for dizziness.  The patient states that it started yesterday but became worse today.  It occurs when she moves her head or stands up.  She describes as room spinning around her.  It is not accompanied by any headache.  Patient states she is undergoing radiation therapy of her pain.  She denies any fevers.  No recent illness.   Per medical record review patient has a history of COPD, small cell lung cancer.   Past Medical History:  Diagnosis Date  . AAA (abdominal aortic aneurysm) ([place])   . Breast cancer ([place]) 2002   left  . COPD (chronic obstructive pulmonary disease) ([place])   . High cholesterol   . Personal history of chemotherapy   . Small cell lung cancer ([place])   . Tobacco abuse     Patient Active Problem List   Diagnosis Date Noted  . Sepsis ([place]) 04/28/2017  . Small cell lung cancer, right upper lobe ([place]) 02/23/2017  . Goals of care, counseling/discussion 02/23/2017  . Lung mass   . AAA (abdominal aortic aneurysm) without rupture ([place]) 11/02/2016  . COPD mixed type ([place]) 11/02/2016  . Hyperlipidemia 11/02/2016  . PAD (peripheral artery disease) ([place]) 11/02/2016    Past Surgical History:  Procedure Laterality Date  . ABDOMINAL HYSTERECTOMY    . ENDOBRONCHIAL ULTRASOUND N/A 02/19/2017   Procedure: ENDOBRONCHIAL ULTRASOUND;  Surgeon: Flora Lipps, MD;  Location: [facility] ORS;  Service: Cardiopulmonary;  Laterality: N/A;  . ENDOVASCULAR REPAIR/STENT GRAFT N/A 12/06/2016   Procedure: Endovascular Repair/Stent Graft;  Surgeon: Katha Cabal, MD;  Location: [facility];  Service: Cardiovascular;   Laterality: N/A;  . IR FLUORO GUIDE PORT INSERTION RIGHT  03/02/2017  . MASTECTOMY Left 2002   with sentinel node     Prior to Admission medications   Medication Sig Start Date End Date Taking? Authorizing Provider  acetaminophen (TYLENOL) 325 MG tablet Take 650 mg by mouth every 6 (six) hours as needed.    [provider]  albuterol (PROVENTIL HFA;VENTOLIN HFA) 108 (90 Base) MCG/ACT inhaler Inhale 2 puffs into the lungs every 6 (six) hours as needed for wheezing or shortness of breath. 03/23/16   Frederich Cha, MD  aspirin EC 81 MG tablet Take 81 mg by mouth daily.    [provider]  atorvastatin (LIPITOR) 10 MG tablet Take 10 mg by mouth at bedtime.  08/11/16   [provider]  benzonatate (TESSALON) 200 MG capsule Take 1 capsule (200 mg total) by mouth 3 (three) times daily as needed for cough. 06/18/17   Sindy Guadeloupe, MD  dexamethasone (DECADRON) 4 MG tablet Take 2 tablets two times a day for 1 day on day 4 after cisplatin chemotherapy. Take with food. Patient not taking: Reported on 05/31/2017 02/23/17   Sindy Guadeloupe, MD  dexamethasone (DECADRON) 4 MG tablet Take 1 tablet (4 mg total) by mouth daily. 06/11/17   Noreene Filbert, MD  fluticasone-salmeterol (ADVAIR HFA) 884-16 MCG/ACT inhaler Inhale 2 puffs into the lungs 2 (two) times daily.    [provider]  guaifenesin (ROBITUSSIN) 100 MG/5ML syrup Take 10 mLs (200 mg total) by mouth 3 (three)  times daily as needed for cough. 05/02/17   Demetrios Loll, MD  HYDROcodone-homatropine [facility]) 5-1.5 MG/5ML syrup Take 5 mLs by mouth every 6 (six) hours as needed for cough. 06/18/17   Sindy Guadeloupe, MD  ipratropium (ATROVENT) 0.02 % nebulizer solution Take 0.5 mg by nebulization 4 (four) times daily.    [provider]  lidocaine-prilocaine (EMLA) cream Apply to affected area once 02/23/17   Sindy Guadeloupe, MD  LORazepam (ATIVAN) 0.5 MG tablet Take 1 tablet (0.5 mg total) by mouth every 6 (six) hours as  needed (Nausea or vomiting). Patient not taking: Reported on 06/04/2017 02/23/17   Sindy Guadeloupe, MD  Multiple Vitamin (MULTIVITAMIN) tablet Take 1 tablet by mouth daily.    [provider]  nystatin (MYCOSTATIN) 100000 UNIT/ML suspension Use as directed 5 mLs in the mouth or throat 4 (four) times daily. Swish and swallow 04/24/17   [provider]  ondansetron (ZOFRAN) 8 MG tablet Take 1 tablet (8 mg total) by mouth 2 (two) times daily as needed. Start on the third day after cisplatin chemotherapy. Patient not taking: Reported on 05/31/2017 02/23/17   Sindy Guadeloupe, MD  oxyCODONE (OXY IR/ROXICODONE) 5 MG immediate release tablet Take 1 tablet (5 mg total) by mouth every 6 (six) hours as needed for severe pain. Patient not taking: Reported on 04/28/2017 03/13/17   Sindy Guadeloupe, MD  prochlorperazine (COMPAZINE) 10 MG tablet Take 1 tablet (10 mg total) by mouth every 6 (six) hours as needed (Nausea or vomiting). Patient not taking: Reported on 04/28/2017 02/23/17   Sindy Guadeloupe, MD  sucralfate (CARAFATE) 1 g tablet Take 1 tablet (1 g total) 4 (four) times daily -  with meals and at bedtime by mouth. dissolve in 8 oz of water Patient not taking: Reported on 05/31/2017 04/03/17   Noreene Filbert, MD    Allergies Patient has no known allergies.  Family History  Problem Relation Age of Onset  . Breast cancer Neg Hx     Social History Social History   Tobacco Use  . Smoking status: Current Every Day Smoker    Packs/day: 0.50    Types: Cigarettes  . Smokeless tobacco: Never Used  Substance Use Topics  . Alcohol use: No  . Drug use: No    Review of Systems Constitutional: No fever/chills Eyes: No visual changes. ENT: No sore throat. Cardiovascular: Denies chest pain. Respiratory: Denies shortness of breath. Gastrointestinal: No abdominal pain.  No nausea, no vomiting.  No diarrhea.   Genitourinary: Negative for dysuria. Musculoskeletal: Negative for back pain. Skin:  Negative for rash. Neurological: Negative for headaches, focal weakness or numbness. Positive for dizziness.  ____________________________________________   PHYSICAL EXAM:  VITAL SIGNS: ED Triage Vitals [06/28/17 1823]  Enc Vitals Group     BP (!) 130/58     Pulse Rate 87     Resp 17     Temp 97.8 F (36.6 C)     Temp Source Oral     SpO2 94 %     Weight 132 lb (59.9 kg)     Height 5\' 7"  (1.702 m)     Head Circumference      Peak Flow      Pain Score 0   Constitutional: Alert and oriented. Well appearing and in no distress. Eyes: Conjunctivae are normal.  ENT   Head: Normocephalic and atraumatic.   Nose: No congestion/rhinnorhea.   Mouth/Throat: Mucous membranes are moist.   Neck: No stridor. Hematological/Lymphatic/Immunilogical: No  cervical lymphadenopathy. Cardiovascular: Normal rate, regular rhythm.  No murmurs, rubs, or gallops. Respiratory: Normal respiratory effort without tachypnea nor retractions. Breath sounds are clear and equal bilaterally. No wheezes/rales/rhonchi. Gastrointestinal: Soft and non tender. No rebound. No guarding.  Genitourinary: Deferred Musculoskeletal: Normal range of motion in all extremities. No lower extremity edema. Neurologic:  Normal speech and language. No gross focal neurologic deficits are appreciated.  Skin:  Skin is warm, dry and intact. No rash noted. Psychiatric: Mood and affect are normal. Speech and behavior are normal. Patient exhibits appropriate insight and judgment.  ____________________________________________    LABS (pertinent positives/negatives)  Trop <0.03 CBC wbc 11.0, hgb 11.7, plt 275 BMP na 132, glu 120, cr 0.76  ____________________________________________   EKG  I, Nance Pear, attending physician, personally viewed and interpreted this EKG  EKG Time: 1829 Rate: 99 Rhythm: sinus rhythm with sinus arrythmia Axis: normal Intervals: qtc 477 QRS: narrow ST changes: no st  elevation Impression: abnormal ekg    ____________________________________________    RADIOLOGY  None  ____________________________________________   PROCEDURES  Procedures  ____________________________________________   INITIAL IMPRESSION / ASSESSMENT AND PLAN / ED COURSE  Pertinent labs & imaging results that were available during my care of the patient were reviewed by me and considered in my medical decision making (see chart for details).  Patient presented to the emergency department today because of concerns for dizziness.  Differential would be broad including anemia, electrolyte abnormality vertigo amongst other etiologies.  Blood work without any obvious etiology of the patient's symptoms.  She did feel better after fluids and meclizine.  At this point I think vertigo likely. Discussed blood work with patient. Discussed vertigo with patient.   ____________________________________________   FINAL CLINICAL IMPRESSION(S) / ED DIAGNOSES  Final diagnoses:  Vertigo  Dizziness     Note: This dictation was prepared with Dragon dictation. Any transcriptional errors that result from this process are unintentional     Nance Pear, MD 06/29/17 1414

## 2017-06-28 NOTE — Telephone Encounter (Signed)
-----   Message from Secundino Ginger sent at 06/26/2017  8:44 AM EST ----- Contact: [phone] Pt is getting very irritated with sister [name]) whom she lives with. [name] said she is going off on her. She gets a wild look in her eyes. Getting aggressive with her over nothing. Please call after 11 am [name] stated she can't deal with it anymore. Something is going on with her.

## 2017-06-28 NOTE — Telephone Encounter (Signed)
Called again and left message to call me and talk about anything going on. Left my number.

## 2017-06-28 NOTE — Discharge Instructions (Signed)
Please seek medical attention for any high fevers, chest pain, shortness of breath, change in behavior, persistent vomiting, bloody stool or any other new or concerning symptoms.  

## 2017-06-29 ENCOUNTER — Ambulatory Visit: Payer: Medicare Other

## 2017-06-29 ENCOUNTER — Telehealth: Payer: Self-pay | Admitting: *Deleted

## 2017-06-29 NOTE — Telephone Encounter (Signed)
-----   Message from Secundino Ginger sent at 06/26/2017  8:44 AM EST ----- Contact: [phone] Pt is getting very irritated with sister [name]) whom she lives with. [name] said she is going off on her. She gets a wild look in her eyes. Getting aggressive with her over nothing. Please call after 11 am [name] stated she can't deal with it anymore. Something is going on with her.

## 2017-06-29 NOTE — Telephone Encounter (Signed)
I have called several time and left message to have Roberta [name] call  Me back and she has not. I do see she went to ED yest. And she had vertigo and was given fluids an meclizine.  I will wait for family or pt to call me back if needed.

## 2017-07-02 ENCOUNTER — Ambulatory Visit
Admission: RE | Admit: 2017-07-02 | Discharge: 2017-07-02 | Disposition: A | Discharge status: Home / Self Care | Payer: Medicare Other | Source: Ambulatory Visit | Attending: Radiation Oncology | Admitting: Radiation Oncology

## 2017-07-02 DIAGNOSIS — Z510 Encounter for antineoplastic radiation therapy: Secondary | ICD-10-CM | POA: Insufficient documentation

## 2017-07-02 DIAGNOSIS — Z298 Encounter for other specified prophylactic measures: Secondary | ICD-10-CM | POA: Diagnosis not present

## 2017-07-02 DIAGNOSIS — F17210 Nicotine dependence, cigarettes, uncomplicated: Secondary | ICD-10-CM | POA: Insufficient documentation

## 2017-07-02 DIAGNOSIS — C3401 Malignant neoplasm of right main bronchus: Secondary | ICD-10-CM | POA: Insufficient documentation

## 2017-07-03 ENCOUNTER — Ambulatory Visit: Payer: Medicare Other

## 2017-07-04 ENCOUNTER — Ambulatory Visit
Admission: RE | Admit: 2017-07-04 | Discharge: 2017-07-04 | Disposition: A | Discharge status: Home / Self Care | Payer: Medicare Other | Source: Ambulatory Visit | Attending: Radiation Oncology | Admitting: Radiation Oncology

## 2017-07-04 DIAGNOSIS — Z510 Encounter for antineoplastic radiation therapy: Secondary | ICD-10-CM | POA: Diagnosis not present

## 2017-07-05 ENCOUNTER — Ambulatory Visit
Admission: RE | Admit: 2017-07-05 | Discharge: 2017-07-05 | Disposition: A | Discharge status: Home / Self Care | Payer: Medicare Other | Source: Ambulatory Visit | Attending: Radiation Oncology | Admitting: Radiation Oncology

## 2017-07-05 DIAGNOSIS — Z510 Encounter for antineoplastic radiation therapy: Secondary | ICD-10-CM | POA: Diagnosis not present

## 2017-07-06 ENCOUNTER — Ambulatory Visit
Admission: RE | Admit: 2017-07-06 | Discharge: 2017-07-06 | Disposition: A | Discharge status: Home / Self Care | Payer: Medicare Other | Source: Ambulatory Visit | Attending: Radiation Oncology | Admitting: Radiation Oncology

## 2017-07-06 DIAGNOSIS — Z510 Encounter for antineoplastic radiation therapy: Secondary | ICD-10-CM | POA: Diagnosis not present

## 2017-07-09 ENCOUNTER — Ambulatory Visit: Payer: Medicare Other

## 2017-07-09 ENCOUNTER — Inpatient Hospital Stay
Admission: RE | Admit: 2017-07-09 | Discharge: 2017-07-09 | Disposition: A | Discharge status: Home / Self Care | Payer: Medicare Other | Source: Ambulatory Visit | Attending: Radiation Oncology | Admitting: Radiation Oncology

## 2017-07-10 ENCOUNTER — Ambulatory Visit
Admission: RE | Admit: 2017-07-10 | Discharge: 2017-07-10 | Disposition: A | Discharge status: Home / Self Care | Payer: Medicare Other | Source: Ambulatory Visit | Attending: Radiation Oncology | Admitting: Radiation Oncology

## 2017-07-10 DIAGNOSIS — Z510 Encounter for antineoplastic radiation therapy: Secondary | ICD-10-CM | POA: Diagnosis not present

## 2017-07-11 ENCOUNTER — Ambulatory Visit
Admission: RE | Admit: 2017-07-11 | Discharge: 2017-07-11 | Disposition: A | Discharge status: Home / Self Care | Payer: Medicare Other | Source: Ambulatory Visit | Attending: Radiation Oncology | Admitting: Radiation Oncology

## 2017-07-11 DIAGNOSIS — Z510 Encounter for antineoplastic radiation therapy: Secondary | ICD-10-CM | POA: Diagnosis not present

## 2017-08-13 ENCOUNTER — Ambulatory Visit
Admission: RE | Admit: 2017-08-13 | Discharge: 2017-08-13 | Disposition: A | Discharge status: Home / Self Care | Payer: Medicare Other | Source: Ambulatory Visit | Attending: Radiation Oncology | Admitting: Radiation Oncology

## 2017-08-13 ENCOUNTER — Other Ambulatory Visit: Payer: Self-pay

## 2017-08-13 ENCOUNTER — Encounter: Payer: Self-pay | Admitting: Radiation Oncology

## 2017-08-13 VITALS — BP 100/69 | HR 100 | Temp 98.3°F | Resp 20 | Wt 123.3 lb

## 2017-08-13 DIAGNOSIS — C3401 Malignant neoplasm of right main bronchus: Secondary | ICD-10-CM

## 2017-08-13 DIAGNOSIS — C3411 Malignant neoplasm of upper lobe, right bronchus or lung: Secondary | ICD-10-CM | POA: Diagnosis not present

## 2017-08-13 NOTE — Progress Notes (Signed)
Radiation Oncology Follow up Note  Name: Roberta [name]   Date:   08/13/2017 MRN:  [id number] DOB: 07/22/1945    This 72 y.o. female presents to the clinic today for one-month follow-up status post PCI for limited stage small cell lung cancer.  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 Seen today in routine follow-up she is doing well. She specifically denies headaches any change in neurologic status cough hemoptysis or significant dyspnea on exertion.She scheduled for follow-up CT scan of her chest in April..  COMPLICATIONS OF TREATMENT: none  FOLLOW UP COMPLIANCE: keeps appointments   PHYSICAL EXAM:  BP 100/69   Pulse 100   Temp 98.3 F (36.8 C)   Resp 20   Wt 123 lb 5.6 oz (56 kg)   BMI 19.32 kg/m  Well-developed well-nourished patient in NAD. HEENT reveals PERLA, EOMI, discs not visualized.  Oral cavity is clear. No oral mucosal lesions are identified. Neck is clear without evidence of cervical or supraclavicular adenopathy. Lungs are clear to A&P. Cardiac examination is essentially unremarkable with regular rate and rhythm without murmur rub or thrill. Abdomen is benign with no organomegaly or masses noted. Motor sensory and DTR levels are equal and symmetric in the upper and lower extremities. Cranial nerves II through XII are grossly intact. Proprioception is intact. No peripheral adenopathy or edema is identified. No motor or sensory levels are noted. Crude visual fields are within normal range.  RADIOLOGY RESULTS: no current films for review  PLAN: present time patient is doing well 1 month out from PCI. She continues close follow-up care with medical oncology and I will a repeat CT scan of her chest next month which I have requested for my review. She otherwise continues to do well I've  asked to see her back in 3-4 months for follow-up. Patient is to call with any concerns.  I would like to take this opportunity to thank you for allowing me to participate in the care of your patient.Noreene Filbert, MD

## 2017-08-28 ENCOUNTER — Inpatient Hospital Stay: Payer: Medicare Other | Attending: Oncology

## 2017-08-28 DIAGNOSIS — J449 Chronic obstructive pulmonary disease, unspecified: Secondary | ICD-10-CM | POA: Diagnosis not present

## 2017-08-28 DIAGNOSIS — F17210 Nicotine dependence, cigarettes, uncomplicated: Secondary | ICD-10-CM | POA: Diagnosis not present

## 2017-08-28 DIAGNOSIS — C3411 Malignant neoplasm of upper lobe, right bronchus or lung: Secondary | ICD-10-CM | POA: Diagnosis not present

## 2017-08-28 DIAGNOSIS — Z853 Personal history of malignant neoplasm of breast: Secondary | ICD-10-CM | POA: Insufficient documentation

## 2017-08-28 LAB — COMPREHENSIVE METABOLIC PANEL
ALK PHOS: 77 U/L (ref 38–126)
ALT: 15 U/L (ref 14–54)
AST: 24 U/L (ref 15–41)
Albumin: 3.3 g/dL — ABNORMAL LOW (ref 3.5–5.0)
Anion gap: 5 (ref 5–15)
BILIRUBIN TOTAL: 0.3 mg/dL (ref 0.3–1.2)
BUN: 14 mg/dL (ref 6–20)
CO2: 26 mmol/L (ref 22–32)
CREATININE: 0.82 mg/dL (ref 0.44–1.00)
Calcium: 8.1 mg/dL — ABNORMAL LOW (ref 8.9–10.3)
Chloride: 101 mmol/L (ref 101–111)
GFR calc Af Amer: >60 mL/min (ref >60)
Glucose, Bld: 92 mg/dL (ref 65–99)
Potassium: 3.6 mmol/L (ref 3.5–5.1)
Sodium: 132 mmol/L — ABNORMAL LOW (ref 135–145)
TOTAL PROTEIN: 6.9 g/dL (ref 6.5–8.1)

## 2017-08-28 LAB — CBC WITH DIFFERENTIAL/PLATELET
BASOS ABS: 0 10*3/uL (ref 0–0.1)
Basophils Relative: 1 %
EOS PCT: 1 %
Eosinophils Absolute: 0.1 10*3/uL (ref 0–0.7)
HCT: 35.2 % (ref 35.0–47.0)
Hemoglobin: 11.8 g/dL — ABNORMAL LOW (ref 12.0–16.0)
LYMPHS ABS: 1.2 10*3/uL (ref 1.0–3.6)
Lymphocytes Relative: 18 %
MCH: 30.4 pg (ref 26.0–34.0)
MCHC: 33.6 g/dL (ref 32.0–36.0)
MCV: 90.4 fL (ref 80.0–100.0)
Monocytes Absolute: 0.8 10*3/uL (ref 0.2–0.9)
Monocytes Relative: 11 %
Neutro Abs: 4.7 10*3/uL (ref 1.4–6.5)
Neutrophils Relative %: 69 %
PLATELETS: 328 10*3/uL (ref 150–440)
RBC: 3.9 MIL/uL (ref 3.80–5.20)
RDW: 16.9 % — ABNORMAL HIGH (ref 11.5–14.5)
WBC: 6.7 10*3/uL (ref 3.6–11.0)

## 2017-08-28 LAB — MAGNESIUM: Magnesium: 1.8 mg/dL (ref 1.7–2.4)

## 2017-08-28 MED ORDER — SODIUM CHLORIDE 0.9% FLUSH
10.0000 mL | INTRAVENOUS | Status: DC | PRN
  Administered 2017-08-28: 10 mL via INTRAVENOUS
  Filled 2017-08-28: qty 10

## 2017-08-28 MED ORDER — HEPARIN SOD (PORK) LOCK FLUSH 100 UNIT/ML IV SOLN
500.0000 [IU] | Freq: Once | INTRAVENOUS | Status: AC
  Administered 2017-08-28: 500 [IU] via INTRAVENOUS
  Filled 2017-08-28: qty 5

## 2017-08-29 ENCOUNTER — Ambulatory Visit
Admission: RE | Admit: 2017-08-29 | Discharge: 2017-08-29 | Disposition: A | Discharge status: Home / Self Care | Payer: Medicare Other | Source: Ambulatory Visit | Attending: Oncology | Admitting: Oncology

## 2017-08-29 DIAGNOSIS — I700 Atherosclerosis of aorta: Secondary | ICD-10-CM | POA: Insufficient documentation

## 2017-08-29 DIAGNOSIS — I719 Aortic aneurysm of unspecified site, without rupture: Secondary | ICD-10-CM | POA: Diagnosis not present

## 2017-08-29 DIAGNOSIS — J439 Emphysema, unspecified: Secondary | ICD-10-CM | POA: Diagnosis not present

## 2017-08-29 DIAGNOSIS — C3411 Malignant neoplasm of upper lobe, right bronchus or lung: Secondary | ICD-10-CM | POA: Diagnosis present

## 2017-08-29 DIAGNOSIS — I2510 Atherosclerotic heart disease of native coronary artery without angina pectoris: Secondary | ICD-10-CM | POA: Insufficient documentation

## 2017-08-29 MED ORDER — IOPAMIDOL (ISOVUE-300) INJECTION 61%
100.0000 mL | Freq: Once | INTRAVENOUS | Status: AC | PRN
  Administered 2017-08-29: 100 mL via INTRAVENOUS

## 2017-08-30 ENCOUNTER — Other Ambulatory Visit: Payer: Medicare Other

## 2017-09-03 ENCOUNTER — Encounter: Payer: Self-pay | Admitting: Oncology

## 2017-09-03 ENCOUNTER — Inpatient Hospital Stay (HOSPITAL_BASED_OUTPATIENT_CLINIC_OR_DEPARTMENT_OTHER): Payer: Medicare Other | Admitting: Oncology

## 2017-09-03 VITALS — BP 102/70 | HR 106 | Temp 97.3°F | Resp 18 | Ht 67.0 in | Wt 125.3 lb

## 2017-09-03 DIAGNOSIS — Z08 Encounter for follow-up examination after completed treatment for malignant neoplasm: Secondary | ICD-10-CM

## 2017-09-03 DIAGNOSIS — F17210 Nicotine dependence, cigarettes, uncomplicated: Secondary | ICD-10-CM

## 2017-09-03 DIAGNOSIS — Z853 Personal history of malignant neoplasm of breast: Secondary | ICD-10-CM | POA: Diagnosis not present

## 2017-09-03 DIAGNOSIS — C3411 Malignant neoplasm of upper lobe, right bronchus or lung: Secondary | ICD-10-CM

## 2017-09-03 DIAGNOSIS — H918X9 Other specified hearing loss, unspecified ear: Secondary | ICD-10-CM

## 2017-09-03 DIAGNOSIS — J449 Chronic obstructive pulmonary disease, unspecified: Secondary | ICD-10-CM

## 2017-09-03 DIAGNOSIS — Z85118 Personal history of other malignant neoplasm of bronchus and lung: Secondary | ICD-10-CM

## 2017-09-03 NOTE — Progress Notes (Signed)
No new changes noted today 

## 2017-09-03 NOTE — Progress Notes (Signed)
Hematology/Oncology Consult note 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 Patient is a 72 year old female with a long-standing history of smoking and smoked 1 pack per day for more than 50 years and is now down to half a pack per day. She also has COPD and recently had abdominal aortic aneurysm status post stenting. She presented with symptoms of mild hemoptysis in August 2018 which prompted CT chest  2. CT chest on 01/23/2017 showed:IMPRESSION: 1. Large right suprahilar mass extending into the mediastinum consistent with primary lung carcinoma with mediastinal extension. 2. Partial atelectasis of right upper lobe due to this large suprahilar mass with probable postobstructive pneumonitis in the right upper lobe peripherally. 3. Small right pleural effusion. 4. Severe thoracic aortic atherosclerosis. Diffuse coronary artery Calcifications.   3. This was followed by a PET CT scan on 02/07/2017 showed:IMPRESSION: 1. Hypermetabolic disease in the upper right hilum and mediastinum compatible with neoplasm. 2. Mild FDG accumulation in peripheral areas of patchy airspace disease which may be secondary sites of neoplasm or related to infection/inflammation. 3. No evidence for distant metastases in the neck, abdomen, or pelvis.   4. Patient underwent bronchoscopy guided biopsy with Dr. Mortimer Fries. Pathology showed small cell lung cancer. MRI brain negative for metastatic disease  5. Patient lives with her sister  and is independent of her ADLs. She reports occasional blood-tinged sputum but no significant frank hemoptysis. Her appetite is good and she denies any unintentional weight loss. She denies any pain. She does have a history of breast cancer in 2002 status post left mastectomy followed by adjuvant chemotherapy. This was treated by Dr. Oliva Bustard. She did not receive any adjuvant radiation.  6. Cycle 1 of cis/etoposide started on 03/05/17.Scans after 2 cycles showed excellent response to treatment. Scans after 4 cycles showed further decrease in the size of lung mass and emdiastinal adenopathy. She also completed PCI    Interval history- appetite is fair. Energy levels are at baseline. She is independent of her ADL's. Does report difficulty with hearing since she completed chemotherapy. Denies any fever, cough or chest pain  ECOG PS- 1 Pain scale- 0   Review of systems- Review of Systems  Constitutional: Positive for malaise/fatigue. Negative for chills, fever and weight loss.  HENT: Positive for hearing loss. Negative for congestion, ear discharge and nosebleeds.   Eyes: Negative for blurred vision.  Respiratory: Negative for cough, hemoptysis, sputum production, shortness of breath and wheezing.   Cardiovascular: Negative for chest pain, palpitations, orthopnea and claudication.  Gastrointestinal: Negative for abdominal pain, blood in stool, constipation, diarrhea, heartburn, melena, nausea and vomiting.  Genitourinary: Negative for dysuria, flank pain, frequency, hematuria and urgency.  Musculoskeletal: Negative for back pain, joint pain and myalgias.  Skin: Negative for rash.  Neurological: Negative for dizziness, tingling, focal weakness, seizures, weakness and headaches.  Endo/Heme/Allergies: Does not bruise/bleed easily.  Psychiatric/Behavioral: Negative for depression and suicidal ideas. The patient does not have insomnia.       No Known Allergies   Past Medical History:    Diagnosis Date  . AAA (abdominal aortic  aneurysm) ([place])   . Breast cancer ([place]) 2002   left  . COPD (chronic obstructive pulmonary disease) ([place])   . High cholesterol   . Personal history of chemotherapy   . Small cell lung cancer ([place])   . Tobacco abuse      Past Surgical History:  Procedure Laterality Date  . ABDOMINAL HYSTERECTOMY    . ENDOBRONCHIAL ULTRASOUND N/A 02/19/2017   Procedure: ENDOBRONCHIAL ULTRASOUND;  Surgeon: Flora Lipps, MD;  Location: [facility] OR[facility];  Service: Cardiopulmonary;  Laterality: N/A;  . ENDOVASCULAR REPAIR/STENT GRAFT N/A 12/06/2016   Procedure: Endovascular Repair/Stent Graft;  Surgeon: Katha Cabal, MD;  Location: [place] [facility];  Service: Cardiovascular;  Laterality: N/A;  . IR FLUORO GUIDE PORT INSERTION RIGHT  03/02/2017  . MASTECTOMY Left 2002   with sentinel node     Social History   Socioeconomic History  . Marital status: Widowed    Spouse name: Not on file  . Number of children: Not on file  . Years of education: Not on file  . Highest education level: Not on file  Occupational History  . Not on file  Social Needs  . Financial resource strain: Not on file  . Food insecurity:    Worry: Not on file    Inability: Not on file  . Transportation needs:    Medical: Not on file    Non-medical: Not on file  Tobacco Use  . Smoking status: Current Every Day Smoker    Packs/day: 0.50    Types: Cigarettes  . Smokeless tobacco: Never Used  Substance and Sexual Activity  . Alcohol use: No  . Drug use: No  . Sexual activity: Never  Lifestyle  . Physical activity:    Days per week: Not on file    Minutes per session: Not on file  . Stress: Not on file  Relationships  . Social connections:    Talks on phone: Not on file    Gets together: Not on file    Attends religious service: Not on file    Active member of club or organization: Not on file    Attends meetings of clubs or organizations: Not on file    Relationship status:  Not on file  . Intimate partner violence:    Fear of current or ex partner: Not on file    Emotionally abused: Not on file    Physically abused: Not on file    Forced sexual activity: Not on file  Other Topics Concern  . Not on file  Social History Narrative  . Not on file    Family History  Problem Relation Age of Onset  . Breast cancer Neg Hx      Current Outpatient Medications:  .  acetaminophen (TYLENOL) 325 MG tablet, Take 650 mg by mouth every 6 (six) hours as needed., Disp: , Rfl:  .  albuterol (PROVENTIL HFA;VENTOLIN HFA) 108 (90 Base) MCG/ACT inhaler, Inhale 2 puffs into the lungs every 6 (six) hours as needed for wheezing or shortness of breath., Disp: 1 Inhaler, Rfl: 1 .  aspirin EC 81 MG tablet, Take 81 mg by mouth daily., Disp: , Rfl:  .  atorvastatin (LIPITOR) 10 MG tablet, Take 10 mg by mouth at bedtime. , Disp: , Rfl:  .  Multiple Vitamin (MULTIVITAMIN) tablet, Take 1 tablet by mouth daily., Disp: , Rfl:  .  benzonatate (TESSALON) 200 MG capsule, Take 1 capsule (200 mg total) by mouth 3 (three) times daily as needed for  cough. (Patient not taking: Reported on 08/13/2017), Disp: 90 capsule, Rfl: 0 .  dexamethasone (DECADRON) 4 MG tablet, Take 2 tablets two times a day for 1 day on day 4 after cisplatin chemotherapy. Take with food. (Patient not taking: Reported on 05/31/2017), Disp: 30 tablet, Rfl: 1 .  dexamethasone (DECADRON) 4 MG tablet, Take 1 tablet (4 mg total) by mouth daily. (Patient not taking: Reported on 08/13/2017), Disp: 33 tablet, Rfl: 0 .  fluticasone-salmeterol (ADVAIR HFA) 115-21 MCG/ACT inhaler, Inhale 2 puffs into the lungs 2 (two) times daily., Disp: , Rfl:  .  guaifenesin (ROBITUSSIN) 100 MG/5ML syrup, Take 10 mLs (200 mg total) by mouth 3 (three) times daily as needed for cough. (Patient not taking: Reported on 08/13/2017), Disp: 118 mL, Rfl: 0 .  HYDROcodone-homatropine (HYCODAN) 5-1.5 MG/5ML syrup, Take 5 mLs by mouth every 6 (six) hours as needed for  cough. (Patient not taking: Reported on 08/13/2017), Disp: 240 mL, Rfl: 0 .  ipratropium (ATROVENT) 0.02 % nebulizer solution, Take 0.5 mg by nebulization 4 (four) times daily., Disp: , Rfl:  .  lidocaine-prilocaine (EMLA) cream, Apply to affected area once (Patient not taking: Reported on 08/13/2017), Disp: 30 g, Rfl: 3 .  LORazepam (ATIVAN) 0.5 MG tablet, Take 1 tablet (0.5 mg total) by mouth every 6 (six) hours as needed (Nausea or vomiting). (Patient not taking: Reported on 06/04/2017), Disp: 30 tablet, Rfl: 0 .  meclizine (ANTIVERT) 25 MG tablet, Take 1 tablet (25 mg total) by mouth 3 (three) times daily as needed for dizziness. (Patient not taking: Reported on 08/13/2017), Disp: 15 tablet, Rfl: 0 .  nystatin (MYCOSTATIN) 100000 UNIT/ML suspension, Use as directed 5 mLs in the mouth or throat 4 (four) times daily. Swish and swallow, Disp: , Rfl:  .  ondansetron (ZOFRAN) 8 MG tablet, Take 1 tablet (8 mg total) by mouth 2 (two) times daily as needed. Start on the third day after cisplatin chemotherapy. (Patient not taking: Reported on 05/31/2017), Disp: 30 tablet, Rfl: 1 .  oxyCODONE (OXY IR/ROXICODONE) 5 MG immediate release tablet, Take 1 tablet (5 mg total) by mouth every 6 (six) hours as needed for severe pain. (Patient not taking: Reported on 04/28/2017), Disp: 30 tablet, Rfl: 0 .  prochlorperazine (COMPAZINE) 10 MG tablet, Take 1 tablet (10 mg total) by mouth every 6 (six) hours as needed (Nausea or vomiting). (Patient not taking: Reported on 04/28/2017), Disp: 30 tablet, Rfl: 1 .  sucralfate (CARAFATE) 1 g tablet, Take 1 tablet (1 g total) 4 (four) times daily -  with meals and at bedtime by mouth. dissolve in 8 oz of water (Patient not taking: Reported on 05/31/2017), Disp: 90 tablet, Rfl: 0 No current facility-administered medications for this visit.   Facility-Administered Medications Ordered in Other Visits:  .  sodium chloride flush (NS) 0.9 % injection 10 mL, 10 mL, Intravenous, PRN, Sindy Guadeloupe, MD, 10 mL at 03/26/17 0948 .  sodium chloride flush (NS) 0.9 % injection 10 mL, 10 mL, Intravenous, PRN, Sindy Guadeloupe, MD, 10 mL at 04/16/17 1006  Physical exam:  Vitals:   09/03/17 1001 09/03/17 1003  BP: 102/70   Pulse: (!) 106   Resp: 18   Temp: (!) 97.3 F (36.3 C)   TempSrc: Tympanic   SpO2: 95%   Weight:  125 lb 5.3 oz (56.8 kg)  Height: 5\' 7"  (1.702 m)    Physical Exam  Constitutional: She is oriented to person, place, and time.  Thin frail lady in no  acute distress  HENT:  Head: Normocephalic and atraumatic.  Poor dentition  Eyes: Pupils are equal, round, and reactive to light. EOM are normal.  Neck: Normal range of motion.  Cardiovascular: Regular rhythm and normal heart sounds.  tachycardic  Pulmonary/Chest: Effort normal and breath sounds normal.  Abdominal: Soft. Bowel sounds are normal.  Lymphadenopathy:  No palpable cervical adenopathy  Neurological: She is alert and oriented to person, place, and time.  Skin: Skin is warm and dry.     CMP Latest Ref Rng & Units 08/28/2017  Glucose 65 - 99 mg/dL 92  BUN 6 - 20 mg/dL 14  Creatinine 0.44 - 1.00 mg/dL 0.82  Sodium 135 - 145 mmol/L 132(L)  Potassium 3.5 - 5.1 mmol/L 3.6  Chloride 101 - 111 mmol/L 101  CO2 22 - 32 mmol/L 26  Calcium 8.9 - 10.3 mg/dL 8.1(L)  Total Protein 6.5 - 8.1 g/dL 6.9  Total Bilirubin 0.3 - 1.2 mg/dL 0.3  Alkaline Phos 38 - 126 U/L 77  AST 15 - 41 U/L 24  ALT 14 - 54 U/L 15   CBC Latest Ref Rng & Units 08/28/2017  WBC 3.6 - 11.0 K/uL 6.7  Hemoglobin 12.0 - 16.0 g/dL 11.8(L)  Hematocrit 35.0 - 47.0 % 35.2  Platelets 150 - 440 K/uL 328    No images are attached to the encounter.  Ct Chest W Contrast  Result Date: 08/29/2017 CLINICAL DATA:  Left breast cancer with chemotherapy and radiation therapy. Lung cancer with chemotherapy and radiation therapy. EXAM: CT CHEST, ABDOMEN, AND PELVIS WITH CONTRAST TECHNIQUE: Multidetector CT imaging of the chest, abdomen and pelvis was  performed following the standard protocol during bolus administration of intravenous contrast. CONTRAST:  147mL ISOVUE-300 IOPAMIDOL (ISOVUE-300) INJECTION 61% COMPARISON:  05/30/2017. FINDINGS: CT CHEST FINDINGS Cardiovascular: Left IJ Port-A-Cath terminates in the high right atrium. Atherosclerotic calcification of the arterial vasculature, including coronary arteries and aortic valve. Heart size normal. No pericardial effusion. Mediastinum/Nodes: No pathologically enlarged mediastinal, hilar or axillary lymph nodes. Surgical clips in the left axilla. Esophagus is grossly unremarkable. Lungs/Pleura: Biapical pleuroparenchymal scarring. Centrilobular and paraseptal emphysema. Nodular lesion in the apical segment right upper lobe measures 11 x 11 mm (series 5, image 32), unchanged. Worsening peribronchovascular ground-glass and consolidation in the right upper and right lower lobes, as well as minimally within the medial left upper lobe. No pleural fluid. There is thickening of the right upper lobe bronchi. Airway is otherwise unremarkable. Musculoskeletal: No worrisome lytic or sclerotic lesions. CT ABDOMEN PELVIS FINDINGS Hepatobiliary: Liver and gallbladder are unremarkable. No biliary ductal dilatation. Pancreas: Negative. Spleen: Negative. Adrenals/Urinary Tract: Adrenal glands and right kidney are unremarkable. There is geographic low attenuation within the lower pole left kidney on nephrographic phase imaging, as before, with associated cortical thinning. Ureters are decompressed. Bladder is grossly unremarkable. Stomach/Bowel: Stomach, small bowel, appendix and colon are unremarkable. Vascular/Lymphatic: Atherosclerotic calcification of the arterial vasculature. Aorto bi-iliac endograft repair of an infrarenal aortic aneurysm, measuring 3.8 cm, stable. No pathologically enlarged lymph nodes. Reproductive: Uterus is visualized.  No adnexal mass. Other: No free fluid.  Mesenteries and peritoneum are  unremarkable. Musculoskeletal: No worrisome lytic or sclerotic lesions. IMPRESSION: 1. Increasing peribronchovascular ground-glass and consolidation in the right upper and right lower lobes, as well as medial left upper lobe, likely due to evolving changes of radiation therapy. 2. Apical segment right upper lobe nodular consolidation, stable. 3. Aortic atherosclerosis (ICD10-170.0). Coronary artery calcification. 4.  Emphysema (ICD10-J43.9). 5. Aorto bi-iliac endograft repair of an  infrarenal aortic aneurysm, stable. Electronically Signed   By: Lorin Picket M.D.   On: 08/29/2017 13:12   Ct Abdomen Pelvis W Contrast  Result Date: 08/29/2017 CLINICAL DATA:  Left breast cancer with chemotherapy and radiation therapy. Lung cancer with chemotherapy and radiation therapy. EXAM: CT CHEST, ABDOMEN, AND PELVIS WITH CONTRAST TECHNIQUE: Multidetector CT imaging of the chest, abdomen and pelvis was performed following the standard protocol during bolus administration of intravenous contrast. CONTRAST:  171mL ISOVUE-300 IOPAMIDOL (ISOVUE-300) INJECTION 61% COMPARISON:  05/30/2017. FINDINGS: CT CHEST FINDINGS Cardiovascular: Left IJ Port-A-Cath terminates in the high right atrium. Atherosclerotic calcification of the arterial vasculature, including coronary arteries and aortic valve. Heart size normal. No pericardial effusion. Mediastinum/Nodes: No pathologically enlarged mediastinal, hilar or axillary lymph nodes. Surgical clips in the left axilla. Esophagus is grossly unremarkable. Lungs/Pleura: Biapical pleuroparenchymal scarring. Centrilobular and paraseptal emphysema. Nodular lesion in the apical segment right upper lobe measures 11 x 11 mm (series 5, image 32), unchanged. Worsening peribronchovascular ground-glass and consolidation in the right upper and right lower lobes, as well as minimally within the medial left upper lobe. No pleural fluid. There is thickening of the right upper lobe bronchi. Airway is otherwise  unremarkable. Musculoskeletal: No worrisome lytic or sclerotic lesions. CT ABDOMEN PELVIS FINDINGS Hepatobiliary: Liver and gallbladder are unremarkable. No biliary ductal dilatation. Pancreas: Negative. Spleen: Negative. Adrenals/Urinary Tract: Adrenal glands and right kidney are unremarkable. There is geographic low attenuation within the lower pole left kidney on nephrographic phase imaging, as before, with associated cortical thinning. Ureters are decompressed. Bladder is grossly unremarkable. Stomach/Bowel: Stomach, small bowel, appendix and colon are unremarkable. Vascular/Lymphatic: Atherosclerotic calcification of the arterial vasculature. Aorto bi-iliac endograft repair of an infrarenal aortic aneurysm, measuring 3.8 cm, stable. No pathologically enlarged lymph nodes. Reproductive: Uterus is visualized.  No adnexal mass. Other: No free fluid.  Mesenteries and peritoneum are unremarkable. Musculoskeletal: No worrisome lytic or sclerotic lesions. IMPRESSION: 1. Increasing peribronchovascular ground-glass and consolidation in the right upper and right lower lobes, as well as medial left upper lobe, likely due to evolving changes of radiation therapy. 2. Apical segment right upper lobe nodular consolidation, stable. 3. Aortic atherosclerosis (ICD10-170.0). Coronary artery calcification. 4.  Emphysema (ICD10-J43.9). 5. Aorto bi-iliac endograft repair of an infrarenal aortic aneurysm, stable. Electronically Signed   By: Lorin Picket M.D.   On: 08/29/2017 13:12     Assessment and plan- Patient is a 72 y.o. 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 She has evolving changes of radiation therapy. 1.1 cm stable lesion in the RUL. No mediastinal or hilar adenopathy. No evidence of distant metastatic  disease. 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 She is able to hear day to day conversations but is hard of hearing at times. I offered audiology referral which she declines.  She continues to smoke and is not interested in quitting     Visit Diagnosis 1. Small cell lung cancer, right upper lobe ([place])   2. Encounter for follow-up surveillance of lung cancer  Dr. Randa Evens, MD, MPH [facility] [phone] 09/03/2017 12:29 PM

## 2017-09-12 ENCOUNTER — Ambulatory Visit
Admission: RE | Admit: 2017-09-12 | Discharge: 2017-09-12 | Disposition: A | Discharge status: Home / Self Care | Payer: Medicare Other | Source: Ambulatory Visit | Attending: Oncology | Admitting: Oncology

## 2017-09-12 DIAGNOSIS — I6782 Cerebral ischemia: Secondary | ICD-10-CM | POA: Insufficient documentation

## 2017-09-12 DIAGNOSIS — G319 Degenerative disease of nervous system, unspecified: Secondary | ICD-10-CM | POA: Diagnosis not present

## 2017-09-12 DIAGNOSIS — C3411 Malignant neoplasm of upper lobe, right bronchus or lung: Secondary | ICD-10-CM | POA: Insufficient documentation

## 2017-09-12 MED ORDER — GADOBENATE DIMEGLUMINE 529 MG/ML IV SOLN
10.0000 mL | Freq: Once | INTRAVENOUS | Status: AC | PRN
  Administered 2017-09-12: 10 mL via INTRAVENOUS

## 2017-10-02 ENCOUNTER — Other Ambulatory Visit (INDEPENDENT_AMBULATORY_CARE_PROVIDER_SITE_OTHER): Payer: Self-pay | Admitting: Vascular Surgery

## 2017-10-02 DIAGNOSIS — I714 Abdominal aortic aneurysm, without rupture, unspecified: Secondary | ICD-10-CM

## 2017-10-02 DIAGNOSIS — I70203 Unspecified atherosclerosis of native arteries of extremities, bilateral legs: Secondary | ICD-10-CM

## 2017-10-03 ENCOUNTER — Ambulatory Visit (INDEPENDENT_AMBULATORY_CARE_PROVIDER_SITE_OTHER): Payer: Medicare Other

## 2017-10-03 ENCOUNTER — Ambulatory Visit (INDEPENDENT_AMBULATORY_CARE_PROVIDER_SITE_OTHER): Payer: Medicare Other | Admitting: Vascular Surgery

## 2017-10-03 ENCOUNTER — Encounter (INDEPENDENT_AMBULATORY_CARE_PROVIDER_SITE_OTHER): Payer: Self-pay | Admitting: Vascular Surgery

## 2017-10-03 VITALS — BP 101/66 | HR 88 | Resp 16 | Ht 65.5 in | Wt 116.4 lb

## 2017-10-03 DIAGNOSIS — R918 Other nonspecific abnormal finding of lung field: Secondary | ICD-10-CM

## 2017-10-03 DIAGNOSIS — I714 Abdominal aortic aneurysm, without rupture, unspecified: Secondary | ICD-10-CM

## 2017-10-03 DIAGNOSIS — E782 Mixed hyperlipidemia: Secondary | ICD-10-CM | POA: Diagnosis not present

## 2017-10-03 DIAGNOSIS — I70203 Unspecified atherosclerosis of native arteries of extremities, bilateral legs: Secondary | ICD-10-CM

## 2017-10-03 NOTE — Progress Notes (Signed)
Subjective:    Patient ID: Roberta [name], female    DOB: Aug 23, 1945, 72 y.o.   MRN: [id number] Chief Complaint  Patient presents with  . Follow-up    60mth EVAR and ABI   Patient presents for a six-month ER follow-up.  The patient is status post an endovascular repair of an abdominal aortic aneurysm with right renal artery stent placement on December 06, 2016.  The patient presents today without complaint.  The patient denies any abdominal pain, back pain or thrombosis to the bilateral lower extremity.  The patient notes that her lung cancer is now in remission she has stopped chemotherapy and radiation.  The patient underwent an Ivar duplex which was notable for a repaired AAA measuring 4.7 cm x 4.2 cm.  This is smaller than the previous measurement 6 months ago.  Patent endovascular aneurysm repair with no evidence of endoleak.  Triphasic blood flow through the right common iliac and left common iliac artery.  Right common iliac artery measures 1.2 cm x 1.2 cm.  Left common iliac artery measures 1.3 cm x 1.2 cm.  The patient also underwent a bilateral lower extremity ABI which was notable for triphasic tibials.  Right ABI 1.2 well left ABI 1.16.  There is no evidence of significant right or left lower extremity arterial disease.  The patient denies any fever, nausea vomiting.  Review of Systems  Constitutional: Negative.   HENT: Negative.   Eyes: Negative.   Respiratory: Negative.   Cardiovascular:       AAA  Gastrointestinal: Negative.   Endocrine: Negative.   Genitourinary: Negative.   Musculoskeletal: Negative.   Skin: Negative.   Allergic/Immunologic: Negative.   Neurological: Negative.   Hematological: Negative.   Psychiatric/Behavioral: Negative.       Objective:   Physical Exam  Constitutional: She is oriented to person, place, and time. She appears well-developed and well-nourished. No distress.  HENT:  Head: Normocephalic and atraumatic.  Right Ear: External ear normal.    Left Ear: External ear normal.  Mouth/Throat: Oropharynx is clear and moist.  Eyes: Pupils are equal, round, and reactive to light. Conjunctivae and EOM are normal.  Neck: Normal range of motion.  Cardiovascular: Normal rate, regular rhythm, normal heart sounds and intact distal pulses.  Pulses:      Radial pulses are 2+ on the right side, and 2+ on the left side.       Dorsalis pedis pulses are 2+ on the right side, and 2+ on the left side.       Posterior tibial pulses are 2+ on the right side, and 2+ on the left side.  Pulmonary/Chest: Effort normal and breath sounds normal.  Abdominal: Soft. Bowel sounds are normal.  Musculoskeletal: Normal range of motion. She exhibits no edema.  Neurological: She is alert and oriented to person, place, and time.  Skin: Skin is warm and dry. She is not diaphoretic.  Psychiatric: She has a normal mood and affect. Her behavior is normal. Judgment and thought content normal.  Vitals reviewed.  BP 101/66 (BP Location: Right Arm)   Pulse 88   Resp 16   Ht 5' 5.5" (1.664 m)   Wt 116 lb 6.4 oz (52.8 kg)   BMI 19.08 kg/m   Past Medical History:  Diagnosis Date  . AAA (abdominal aortic aneurysm) ([place])   . Breast cancer ([place]) 2002   left  . COPD (chronic obstructive pulmonary disease) ([place])   . High cholesterol   . Personal history  of chemotherapy   . Small cell lung cancer ([place])   . Tobacco abuse    Social History   Socioeconomic History  . Marital status: Widowed    Spouse name: Not on file  . Number of children: Not on file  . Years of education: Not on file  . Highest education level: Not on file  Occupational History  . Not on file  Social Needs  . Financial resource strain: Not on file  . Food insecurity:    Worry: Not on file    Inability: Not on file  . Transportation needs:    Medical: Not on file    Non-medical: Not on file  Tobacco Use  . Smoking status: Current Every Day Smoker    Packs/day: 0.50    Types: Cigarettes   . Smokeless tobacco: Never Used  Substance and Sexual Activity  . Alcohol use: No  . Drug use: No  . Sexual activity: Never  Lifestyle  . Physical activity:    Days per week: Not on file    Minutes per session: Not on file  . Stress: Not on file  Relationships  . Social connections:    Talks on phone: Not on file    Gets together: Not on file    Attends religious service: Not on file    Active member of club or organization: Not on file    Attends meetings of clubs or organizations: Not on file    Relationship status: Not on file  . Intimate partner violence:    Fear of current or ex partner: Not on file    Emotionally abused: Not on file    Physically abused: Not on file    Forced sexual activity: Not on file  Other Topics Concern  . Not on file  Social History Narrative  . Not on file   Past Surgical History:  Procedure Laterality Date  . ABDOMINAL HYSTERECTOMY    . ENDOBRONCHIAL ULTRASOUND N/A 02/19/2017   Procedure: ENDOBRONCHIAL ULTRASOUND;  Surgeon: Flora Lipps, MD;  Location: [facility] ORS;  Service: Cardiopulmonary;  Laterality: N/A;  . ENDOVASCULAR REPAIR/STENT GRAFT N/A 12/06/2016   Procedure: Endovascular Repair/Stent Graft;  Surgeon: Katha Cabal, MD;  Location: [facility];  Service: Cardiovascular;  Laterality: N/A;  . IR FLUORO GUIDE PORT INSERTION RIGHT  03/02/2017  . MASTECTOMY Left 2002   with sentinel node    Family History  Problem Relation Age of Onset  . Breast cancer Neg Hx    No Known Allergies     Assessment & Plan:  Patient presents for a six-month ER follow-up.  The patient is status post an endovascular repair of an abdominal aortic aneurysm with right renal artery stent placement on December 06, 2016.  The patient presents today without complaint.  The patient denies any abdominal pain, back pain or thrombosis to the bilateral lower extremity.  The patient notes that her lung cancer is now in remission she has stopped chemotherapy and  radiation.  The patient underwent an Ivar duplex which was notable for a repaired AAA measuring 4.7 cm x 4.2 cm.  This is smaller than the previous measurement 6 months ago.  Patent endovascular aneurysm repair with no evidence of endoleak.  Triphasic blood flow through the right common iliac and left common iliac artery.  Right common iliac artery measures 1.2 cm x 1.2 cm.  Left common iliac artery measures 1.3 cm x 1.2 cm.  The patient also underwent a bilateral lower extremity  ABI which was notable for triphasic tibials.  Right ABI 1.2 well left ABI 1.16.  There is no evidence of significant right or left lower extremity arterial disease.  The patient denies any fever, nausea vomiting.  1. AAA (abdominal aortic aneurysm) without rupture ([facility]) - Stable Patient presents for a six-month Ivar/ABI follow-up There is no evidence of enlarging aneurysmal sac or endoleak on duplex There is no evidence of significant lower extremity arterial disease The patient is asymptomatic and physical exam is unremarkable The patient should follow-up in 1 year for repeat Ivar/ABI. The patient's blood pressure is being adequately controlled however I have reviewed the importance of hypertension and lipid control and the importance of continuing his abstinence from tobacco.  The patient is also encouraged to exercise a minimum of 30 minutes 4 times a week.  Should the patient develop new onset abdominal or back pain or signs of peripheral embolization they are instructed to seek medical attention immediately and to alert the physician providing care that they have an aneurysm.  The patient voices their understanding  - VAS [place] EVAR DUPLEX; Future - VAS [place] ABI WITH/WO TBI; Future  2. Lung mass - Stable Patient is now in remission and has stopped chemotherapy and radiation.  3. Mixed hyperlipidemia - Stable Encouraged good control as its slows the progression of atherosclerotic disease This is followed by her primary  care physician  Current Outpatient Medications on File Prior to Visit  Medication Sig Dispense Refill  . acetaminophen (TYLENOL) 325 MG tablet Take 650 mg by mouth every 6 (six) hours as needed.    Marland Kitchen albuterol (PROVENTIL HFA;VENTOLIN HFA) 108 (90 Base) MCG/ACT inhaler Inhale 2 puffs into the lungs every 6 (six) hours as needed for wheezing or shortness of breath. 1 Inhaler 1  . aspirin EC 81 MG tablet Take 81 mg by mouth daily.    Marland Kitchen atorvastatin (LIPITOR) 10 MG tablet Take 10 mg by mouth at bedtime.     . fluticasone-salmeterol (ADVAIR HFA) 115-21 MCG/ACT inhaler Inhale 2 puffs into the lungs 2 (two) times daily.    Marland Kitchen ipratropium (ATROVENT) 0.02 % nebulizer solution Take 0.5 mg by nebulization 4 (four) times daily.    . Multiple Vitamin (MULTIVITAMIN) tablet Take 1 tablet by mouth daily.    Marland Kitchen nystatin (MYCOSTATIN) 100000 UNIT/ML suspension Use as directed 5 mLs in the mouth or throat 4 (four) times daily. Swish and swallow    . benzonatate (TESSALON) 200 MG capsule Take 1 capsule (200 mg total) by mouth 3 (three) times daily as needed for cough. (Patient not taking: Reported on 08/13/2017) 90 capsule 0  . dexamethasone (DECADRON) 4 MG tablet Take 2 tablets two times a day for 1 day on day 4 after cisplatin chemotherapy. Take with food. (Patient not taking: Reported on 05/31/2017) 30 tablet 1  . dexamethasone (DECADRON) 4 MG tablet Take 1 tablet (4 mg total) by mouth daily. (Patient not taking: Reported on 08/13/2017) 33 tablet 0  . guaifenesin (ROBITUSSIN) 100 MG/5ML syrup Take 10 mLs (200 mg total) by mouth 3 (three) times daily as needed for cough. (Patient not taking: Reported on 08/13/2017) 118 mL 0  . HYDROcodone-homatropine (HYCODAN) 5-1.5 MG/5ML syrup Take 5 mLs by mouth every 6 (six) hours as needed for cough. (Patient not taking: Reported on 08/13/2017) 240 mL 0  . lidocaine-prilocaine (EMLA) cream Apply to affected area once (Patient not taking: Reported on 08/13/2017) 30 g 3  . LORazepam  (ATIVAN) 0.5 MG tablet Take 1 tablet (  0.5 mg total) by mouth every 6 (six) hours as needed (Nausea or vomiting). (Patient not taking: Reported on 06/04/2017) 30 tablet 0  . meclizine (ANTIVERT) 25 MG tablet Take 1 tablet (25 mg total) by mouth 3 (three) times daily as needed for dizziness. (Patient not taking: Reported on 08/13/2017) 15 tablet 0  . ondansetron (ZOFRAN) 8 MG tablet Take 1 tablet (8 mg total) by mouth 2 (two) times daily as needed. Start on the third day after cisplatin chemotherapy. (Patient not taking: Reported on 05/31/2017) 30 tablet 1  . oxyCODONE (OXY IR/ROXICODONE) 5 MG immediate release tablet Take 1 tablet (5 mg total) by mouth every 6 (six) hours as needed for severe pain. (Patient not taking: Reported on 04/28/2017) 30 tablet 0  . prochlorperazine (COMPAZINE) 10 MG tablet Take 1 tablet (10 mg total) by mouth every 6 (six) hours as needed (Nausea or vomiting). (Patient not taking: Reported on 04/28/2017) 30 tablet 1  . sucralfate (CARAFATE) 1 g tablet Take 1 tablet (1 g total) 4 (four) times daily -  with meals and at bedtime by mouth. dissolve in 8 oz of water (Patient not taking: Reported on 05/31/2017) 90 tablet 0   Current Facility-Administered Medications on File Prior to Visit  Medication Dose Route Frequency Provider Last Rate Last Dose  . sodium chloride flush (NS) 0.9 % injection 10 mL  10 mL Intravenous PRN Sindy Guadeloupe, MD   10 mL at 03/26/17 0948  . sodium chloride flush (NS) 0.9 % injection 10 mL  10 mL Intravenous PRN Sindy Guadeloupe, MD   10 mL at 04/16/17 1006   There are no Patient Instructions on file for this visit. No follow-ups on file.  KIMBERLY A STEGMAYER, PA-C

## 2017-10-08 ENCOUNTER — Inpatient Hospital Stay: Payer: Medicare Other | Attending: Oncology

## 2017-10-08 VITALS — BP 114/67 | HR 90 | Temp 97.6°F | Resp 18

## 2017-10-08 DIAGNOSIS — C3411 Malignant neoplasm of upper lobe, right bronchus or lung: Secondary | ICD-10-CM | POA: Insufficient documentation

## 2017-10-08 DIAGNOSIS — Z452 Encounter for adjustment and management of vascular access device: Secondary | ICD-10-CM | POA: Diagnosis present

## 2017-10-08 MED ORDER — HEPARIN SOD (PORK) LOCK FLUSH 100 UNIT/ML IV SOLN
500.0000 [IU] | Freq: Once | INTRAVENOUS | Status: AC
  Administered 2017-10-08: 500 [IU] via INTRAVENOUS
  Filled 2017-10-08: qty 5

## 2017-10-08 MED ORDER — SODIUM CHLORIDE 0.9% FLUSH
10.0000 mL | INTRAVENOUS | Status: DC | PRN
  Administered 2017-10-08: 10 mL via INTRAVENOUS
  Filled 2017-10-08: qty 10

## 2017-10-28 ENCOUNTER — Other Ambulatory Visit: Payer: Self-pay

## 2017-10-28 DIAGNOSIS — Z9221 Personal history of antineoplastic chemotherapy: Secondary | ICD-10-CM

## 2017-10-28 DIAGNOSIS — Z79899 Other long term (current) drug therapy: Secondary | ICD-10-CM

## 2017-10-28 DIAGNOSIS — E43 Unspecified severe protein-calorie malnutrition: Secondary | ICD-10-CM | POA: Diagnosis present

## 2017-10-28 DIAGNOSIS — Z7982 Long term (current) use of aspirin: Secondary | ICD-10-CM

## 2017-10-28 DIAGNOSIS — Z9012 Acquired absence of left breast and nipple: Secondary | ICD-10-CM

## 2017-10-28 DIAGNOSIS — K120 Recurrent oral aphthae: Secondary | ICD-10-CM | POA: Diagnosis present

## 2017-10-28 DIAGNOSIS — R42 Dizziness and giddiness: Secondary | ICD-10-CM | POA: Diagnosis not present

## 2017-10-28 DIAGNOSIS — J181 Lobar pneumonia, unspecified organism: Secondary | ICD-10-CM | POA: Diagnosis present

## 2017-10-28 DIAGNOSIS — L03211 Cellulitis of face: Secondary | ICD-10-CM | POA: Diagnosis present

## 2017-10-28 DIAGNOSIS — W57XXXA Bitten or stung by nonvenomous insect and other nonvenomous arthropods, initial encounter: Secondary | ICD-10-CM | POA: Diagnosis present

## 2017-10-28 DIAGNOSIS — Z90710 Acquired absence of both cervix and uterus: Secondary | ICD-10-CM

## 2017-10-28 DIAGNOSIS — Z681 Body mass index (BMI) 19 or less, adult: Secondary | ICD-10-CM

## 2017-10-28 DIAGNOSIS — Z85118 Personal history of other malignant neoplasm of bronchus and lung: Secondary | ICD-10-CM

## 2017-10-28 DIAGNOSIS — F17210 Nicotine dependence, cigarettes, uncomplicated: Secondary | ICD-10-CM | POA: Diagnosis present

## 2017-10-28 DIAGNOSIS — J440 Chronic obstructive pulmonary disease with acute lower respiratory infection: Secondary | ICD-10-CM | POA: Diagnosis present

## 2017-10-28 DIAGNOSIS — Z853 Personal history of malignant neoplasm of breast: Secondary | ICD-10-CM

## 2017-10-28 DIAGNOSIS — A419 Sepsis, unspecified organism: Principal | ICD-10-CM | POA: Diagnosis present

## 2017-10-28 DIAGNOSIS — Z95828 Presence of other vascular implants and grafts: Secondary | ICD-10-CM

## 2017-10-28 NOTE — ED Triage Notes (Signed)
Reports patient with dizziness and headache for 1 week.  Reports cough to 2 weeks.  Reports notice swelling to right lower jaw tonight, patient reports thinks bitten by something.  Patient last had chemo in January.

## 2017-10-29 ENCOUNTER — Emergency Department: Payer: Medicare Other

## 2017-10-29 ENCOUNTER — Encounter: Payer: Self-pay | Admitting: Radiology

## 2017-10-29 ENCOUNTER — Inpatient Hospital Stay
Admission: EM | Admit: 2017-10-29 | Discharge: 2017-10-30 | DRG: 871 | Disposition: A | Discharge status: Home / Self Care | Payer: Medicare Other | Attending: Internal Medicine | Admitting: Internal Medicine

## 2017-10-29 DIAGNOSIS — J189 Pneumonia, unspecified organism: Secondary | ICD-10-CM

## 2017-10-29 DIAGNOSIS — Z9012 Acquired absence of left breast and nipple: Secondary | ICD-10-CM | POA: Diagnosis not present

## 2017-10-29 DIAGNOSIS — Z853 Personal history of malignant neoplasm of breast: Secondary | ICD-10-CM | POA: Diagnosis not present

## 2017-10-29 DIAGNOSIS — F17210 Nicotine dependence, cigarettes, uncomplicated: Secondary | ICD-10-CM | POA: Diagnosis present

## 2017-10-29 DIAGNOSIS — L03211 Cellulitis of face: Secondary | ICD-10-CM | POA: Diagnosis present

## 2017-10-29 DIAGNOSIS — A419 Sepsis, unspecified organism: Secondary | ICD-10-CM | POA: Diagnosis present

## 2017-10-29 DIAGNOSIS — Z9221 Personal history of antineoplastic chemotherapy: Secondary | ICD-10-CM | POA: Diagnosis not present

## 2017-10-29 DIAGNOSIS — Z85118 Personal history of other malignant neoplasm of bronchus and lung: Secondary | ICD-10-CM | POA: Diagnosis not present

## 2017-10-29 DIAGNOSIS — Z90710 Acquired absence of both cervix and uterus: Secondary | ICD-10-CM | POA: Diagnosis not present

## 2017-10-29 DIAGNOSIS — Z95828 Presence of other vascular implants and grafts: Secondary | ICD-10-CM | POA: Diagnosis not present

## 2017-10-29 DIAGNOSIS — K120 Recurrent oral aphthae: Secondary | ICD-10-CM | POA: Diagnosis present

## 2017-10-29 DIAGNOSIS — W57XXXA Bitten or stung by nonvenomous insect and other nonvenomous arthropods, initial encounter: Secondary | ICD-10-CM | POA: Diagnosis present

## 2017-10-29 DIAGNOSIS — E43 Unspecified severe protein-calorie malnutrition: Secondary | ICD-10-CM

## 2017-10-29 DIAGNOSIS — Z681 Body mass index (BMI) 19 or less, adult: Secondary | ICD-10-CM | POA: Diagnosis not present

## 2017-10-29 DIAGNOSIS — J181 Lobar pneumonia, unspecified organism: Secondary | ICD-10-CM | POA: Diagnosis present

## 2017-10-29 DIAGNOSIS — R42 Dizziness and giddiness: Secondary | ICD-10-CM

## 2017-10-29 DIAGNOSIS — I951 Orthostatic hypotension: Secondary | ICD-10-CM

## 2017-10-29 DIAGNOSIS — Z7982 Long term (current) use of aspirin: Secondary | ICD-10-CM | POA: Diagnosis not present

## 2017-10-29 DIAGNOSIS — Z79899 Other long term (current) drug therapy: Secondary | ICD-10-CM | POA: Diagnosis not present

## 2017-10-29 DIAGNOSIS — J440 Chronic obstructive pulmonary disease with acute lower respiratory infection: Secondary | ICD-10-CM | POA: Diagnosis present

## 2017-10-29 LAB — CBC WITH DIFFERENTIAL/PLATELET
Basophils Absolute: 0.1 10*3/uL (ref 0–0.1)
Basophils Relative: 1 %
EOS ABS: 0.1 10*3/uL (ref 0–0.7)
EOS PCT: 1 %
HCT: 38.3 % (ref 35.0–47.0)
HEMOGLOBIN: 12.6 g/dL (ref 12.0–16.0)
LYMPHS ABS: 1.2 10*3/uL (ref 1.0–3.6)
LYMPHS PCT: 16 %
MCH: 29 pg (ref 26.0–34.0)
MCHC: 32.9 g/dL (ref 32.0–36.0)
MCV: 88.2 fL (ref 80.0–100.0)
MONOS PCT: 13 %
Monocytes Absolute: 1 10*3/uL — ABNORMAL HIGH (ref 0.2–0.9)
Neutro Abs: 5.4 10*3/uL (ref 1.4–6.5)
Neutrophils Relative %: 69 %
Platelets: 390 10*3/uL (ref 150–440)
RBC: 4.34 MIL/uL (ref 3.80–5.20)
RDW: 15.9 % — ABNORMAL HIGH (ref 11.5–14.5)
WBC: 7.7 10*3/uL (ref 3.6–11.0)

## 2017-10-29 LAB — URINALYSIS, COMPLETE (UACMP) WITH MICROSCOPIC
BACTERIA UA: NONE SEEN
BILIRUBIN URINE: NEGATIVE
Glucose, UA: NEGATIVE mg/dL
HGB URINE DIPSTICK: NEGATIVE
KETONES UR: NEGATIVE mg/dL
Leukocytes, UA: NEGATIVE
NITRITE: NEGATIVE
PROTEIN: NEGATIVE mg/dL
SPECIFIC GRAVITY, URINE: 1.031 — AB (ref 1.005–1.030)
pH: 6 (ref 5.0–8.0)

## 2017-10-29 LAB — BASIC METABOLIC PANEL
Anion gap: 13 (ref 5–15)
BUN: 14 mg/dL (ref 6–20)
CHLORIDE: 98 mmol/L — AB (ref 101–111)
CO2: 26 mmol/L (ref 22–32)
CREATININE: 1.11 mg/dL — AB (ref 0.44–1.00)
Calcium: 9.1 mg/dL (ref 8.9–10.3)
GFR calc Af Amer: 57 mL/min — ABNORMAL LOW (ref >60)
GFR calc non Af Amer: 49 mL/min — ABNORMAL LOW (ref >60)
GLUCOSE: 112 mg/dL — AB (ref 65–99)
Potassium: 3.9 mmol/L (ref 3.5–5.1)
SODIUM: 137 mmol/L (ref 135–145)

## 2017-10-29 LAB — LACTIC ACID, PLASMA: LACTIC ACID, VENOUS: 1.1 mmol/L (ref 0.5–1.9)

## 2017-10-29 LAB — TROPONIN I: Troponin I: <0.03 ng/mL (ref <0.03)

## 2017-10-29 MED ORDER — ONDANSETRON HCL 4 MG/2ML IJ SOLN: 4.0000 mg | Freq: Four times a day (QID) | INTRAMUSCULAR | Status: DC | PRN

## 2017-10-29 MED ORDER — SODIUM CHLORIDE 0.9 % IV BOLUS (SEPSIS)
500.0000 mL | Freq: Once | INTRAVENOUS | Status: AC
  Administered 2017-10-29: 500 mL via INTRAVENOUS

## 2017-10-29 MED ORDER — ENSURE ENLIVE PO LIQD
237.0000 mL | Freq: Two times a day (BID) | ORAL | Status: DC
  Administered 2017-10-30 (×2): 237 mL via ORAL

## 2017-10-29 MED ORDER — HYDROCORTISONE 1 % EX CREA
TOPICAL_CREAM | Freq: Two times a day (BID) | CUTANEOUS | Status: DC
  Administered 2017-10-29 – 2017-10-30 (×3): via TOPICAL
  Filled 2017-10-29: qty 28

## 2017-10-29 MED ORDER — ATORVASTATIN CALCIUM 10 MG PO TABS
10.0000 mg | ORAL_TABLET | Freq: Every day | ORAL | Status: DC
  Administered 2017-10-29: 21:00:00 10 mg via ORAL
  Filled 2017-10-29 (×2): qty 1

## 2017-10-29 MED ORDER — IPRATROPIUM-ALBUTEROL 0.5-2.5 (3) MG/3ML IN SOLN: 3.0000 mL | Freq: Four times a day (QID) | RESPIRATORY_TRACT | Status: DC | PRN

## 2017-10-29 MED ORDER — HYDROCOD POLST-CPM POLST ER 10-8 MG/5ML PO SUER
ORAL | Status: AC
  Filled 2017-10-29: qty 5

## 2017-10-29 MED ORDER — NYSTATIN 100000 UNIT/ML MT SUSP
5.0000 mL | Freq: Four times a day (QID) | OROMUCOSAL | Status: DC
  Administered 2017-10-29 – 2017-10-30 (×4): 500000 [IU] via OROMUCOSAL
  Filled 2017-10-29 (×4): qty 5

## 2017-10-29 MED ORDER — ONDANSETRON HCL 4 MG PO TABS: 4.0000 mg | ORAL_TABLET | Freq: Four times a day (QID) | ORAL | Status: DC | PRN

## 2017-10-29 MED ORDER — ASPIRIN EC 81 MG PO TBEC
81.0000 mg | DELAYED_RELEASE_TABLET | Freq: Every day | ORAL | Status: DC
  Administered 2017-10-29 – 2017-10-30 (×2): 81 mg via ORAL
  Filled 2017-10-29 (×2): qty 1

## 2017-10-29 MED ORDER — HYDROCOD POLST-CPM POLST ER 10-8 MG/5ML PO SUER
5.0000 mL | Freq: Two times a day (BID) | ORAL | Status: DC
  Administered 2017-10-29 – 2017-10-30 (×3): 5 mL via ORAL
  Filled 2017-10-29 (×3): qty 5

## 2017-10-29 MED ORDER — MOMETASONE FURO-FORMOTEROL FUM 200-5 MCG/ACT IN AERO
2.0000 | INHALATION_SPRAY | Freq: Two times a day (BID) | RESPIRATORY_TRACT | Status: DC
  Administered 2017-10-29 – 2017-10-30 (×3): 2 via RESPIRATORY_TRACT
  Filled 2017-10-29: qty 8.8

## 2017-10-29 MED ORDER — GUAIFENESIN ER 600 MG PO TB12
600.0000 mg | ORAL_TABLET | Freq: Two times a day (BID) | ORAL | Status: DC
  Administered 2017-10-29 – 2017-10-30 (×3): 600 mg via ORAL
  Filled 2017-10-29 (×4): qty 1

## 2017-10-29 MED ORDER — NICOTINE 14 MG/24HR TD PT24
14.0000 mg | MEDICATED_PATCH | Freq: Every day | TRANSDERMAL | Status: DC
  Administered 2017-10-29 – 2017-10-30 (×2): 14 mg via TRANSDERMAL
  Filled 2017-10-29 (×2): qty 1

## 2017-10-29 MED ORDER — SODIUM CHLORIDE 0.9 % IV SOLN
2.0000 g | INTRAVENOUS | Status: DC
  Administered 2017-10-29 – 2017-10-30 (×2): 2 g via INTRAVENOUS
  Filled 2017-10-29: qty 20
  Filled 2017-10-29: qty 2

## 2017-10-29 MED ORDER — SODIUM CHLORIDE 0.9 % IV BOLUS (SEPSIS)
250.0000 mL | Freq: Once | INTRAVENOUS | Status: AC
  Administered 2017-10-29: 250 mL via INTRAVENOUS

## 2017-10-29 MED ORDER — SODIUM CHLORIDE 0.9 % IV SOLN
INTRAVENOUS | Status: DC
  Administered 2017-10-29 – 2017-10-30 (×2): via INTRAVENOUS

## 2017-10-29 MED ORDER — HYDROCOD POLST-CPM POLST ER 10-8 MG/5ML PO SUER
5.0000 mL | Freq: Once | ORAL | Status: AC
Start: 2017-10-29 — End: 2017-10-29
  Administered 2017-10-29: 5 mL via ORAL

## 2017-10-29 MED ORDER — SODIUM CHLORIDE 0.9 % IV SOLN
500.0000 mg | INTRAVENOUS | Status: DC
  Administered 2017-10-29 – 2017-10-30 (×2): 500 mg via INTRAVENOUS
  Filled 2017-10-29 (×2): qty 500

## 2017-10-29 MED ORDER — POLYETHYLENE GLYCOL 3350 17 G PO PACK: 17.0000 g | PACK | Freq: Every day | ORAL | Status: DC | PRN

## 2017-10-29 MED ORDER — ACETAMINOPHEN 325 MG PO TABS
650.0000 mg | ORAL_TABLET | Freq: Four times a day (QID) | ORAL | Status: DC | PRN
Start: 2017-10-29 — End: 2017-10-30
  Administered 2017-10-29 – 2017-10-30 (×2): 650 mg via ORAL
  Filled 2017-10-29 (×2): qty 2

## 2017-10-29 MED ORDER — DOCUSATE SODIUM 100 MG PO CAPS
100.0000 mg | ORAL_CAPSULE | Freq: Two times a day (BID) | ORAL | Status: DC
  Administered 2017-10-29 – 2017-10-30 (×3): 100 mg via ORAL
  Filled 2017-10-29 (×3): qty 1

## 2017-10-29 MED ORDER — ENOXAPARIN SODIUM 40 MG/0.4ML ~~LOC~~ SOLN
40.0000 mg | SUBCUTANEOUS | Status: DC
  Administered 2017-10-29 – 2017-10-30 (×2): 40 mg via SUBCUTANEOUS
  Filled 2017-10-29 (×2): qty 0.4

## 2017-10-29 MED ORDER — SODIUM CHLORIDE 0.9 % IV BOLUS (SEPSIS): 1000.0000 mL | Freq: Once | INTRAVENOUS | Status: DC

## 2017-10-29 MED ORDER — ADULT MULTIVITAMIN W/MINERALS CH
1.0000 | ORAL_TABLET | Freq: Every day | ORAL | Status: DC
Start: 2017-10-29 — End: 2017-10-30
  Administered 2017-10-29 – 2017-10-30 (×2): 1 via ORAL
  Filled 2017-10-29 (×3): qty 1

## 2017-10-29 MED ORDER — IOPAMIDOL (ISOVUE-300) INJECTION 61%
75.0000 mL | Freq: Once | INTRAVENOUS | Status: AC | PRN
  Administered 2017-10-29: 75 mL via INTRAVENOUS

## 2017-10-29 MED ORDER — SODIUM CHLORIDE 0.9 % IV BOLUS
1000.0000 mL | Freq: Once | INTRAVENOUS | Status: AC
Start: 2017-10-29 — End: 2017-10-29
  Administered 2017-10-29: 1000 mL via INTRAVENOUS

## 2017-10-29 NOTE — ED Notes (Signed)
Pt states she has had a headache, right sided with dizziness for 8 days. Pt states "I think a bug bit me yesterday". Pt states she believe insect bite occurred to right lower jaw and cheek. See skin assessment for assessment of jaw and cheek. Pt with noted tachycardia rate 106-108. Pt denies known fever, nausea, shob, chest pain. Pt with history of lung cancer and is in remission per pt. Pt with implanted port to left chest. Pt states if she needs an iv she would like it in her arm and not have port accessed.

## 2017-10-29 NOTE — ED Notes (Signed)
Pt ambulatory to toilet with steady gait. This RN forgot to give pt a urine specimen cup to collect urine sample.

## 2017-10-29 NOTE — ED Notes (Signed)
Patient transported to CT 

## 2017-10-29 NOTE — ED Notes (Signed)
Family up to desk; updated on wait time

## 2017-10-29 NOTE — ED Notes (Signed)
Report to kate, rn.

## 2017-10-29 NOTE — Progress Notes (Signed)
CODE SEPSIS - PHARMACY COMMUNICATION  **Broad Spectrum Antibiotics should be administered within 1 hour of Sepsis diagnosis**  Time Code Sepsis Called/Page Received: 5859  Antibiotics Ordered: azithromycin/ceftriaxone  Time of 1st antibiotic administration: 0518  Additional action taken by pharmacy:   If necessary, Name of Provider/Nurse Contacted:     Tobie Lords ,PharmD Clinical Pharmacist  10/29/2017  5:55 AM

## 2017-10-29 NOTE — Progress Notes (Signed)
Initial Nutrition Assessment  DOCUMENTATION CODES:   Severe malnutrition in context of chronic illness, Underweight  INTERVENTION:  Recommend liberalizing diet to regular.  Provide Ensure Enlive po BID, each supplement provides 350 kcal and 20 grams of protein. Patient prefers vanilla or strawberry.  Continue daily MVI.  Encouraged adequate intake of calories and protein through meals, snacks, beverages, and ONS to prevent any further weight loss or loss of lean body mass.  NUTRITION DIAGNOSIS:   Severe Malnutrition related to chronic illness(stage IIIB small cell lung cancer s/p chemotherapy and XRT, COPD) as evidenced by severe fat depletion, severe muscle depletion, 20.3 percent weight loss over 6 months.  GOAL:   Patient will meet greater than or equal to 90% of their needs  MONITOR:   PO intake, Supplement acceptance, Labs, Weight trends, Skin, I & O's  REASON FOR ASSESSMENT:   Other (Comment)(Low BMI)    ASSESSMENT:   72 year old Roberta [name] with PMHx of COPD, hx breast cancer s/p left mastectomy in 2002 and adjuvant chemotherapy, AAA, stage IIIB small cell lung cancer s/p 4 cycles of cis/etoposide and concurrent XRT who is now admitted with PNA, right perioral cellulitis, headache/dizziness.   Met with patient at bedside. She is known to this RD from a previous assessment in Roberta/2018 when patient was still on chemotherapy. She reports her appetite remains poor as she still cannot taste her food. She tries to eat 2-3 meals per day but reports it is hard to eat much at her meals. She tries to have a meat (chicken, hamburger, or meatloaf) with potatoes (made with butter) and vegetables. She had been drinking 3-4 Ensure daily but reports one of her MDs told her to stop drinking the Ensure. However, patient is concerned that she is not getting enough nutrition from meals and is wondering if she should start drinking Ensure again. Discussed that as patient is still losing weight it is  likely best for her to drink Ensure in addition to her meals so she can meet her increased calorie/protein needs. Patient denies any difficulty with chewing/swallowing (even though she does have right facial cellulitis), N/V, abdominal pain, or constipation/diarrhea.  UBW was 145 lbs. Per chart patient was 145.1 lbs on Roberta/08/2016. Patient has lost 29.4 lbs (20.3% body weight) over 6 months, which is very significant for time frame. This amount of weight loss would be significant if lost over Roberta months.  Meal Completion: 75% of lunch today (approximately 390 kcal and 23 grams of protein)  Medications reviewed and include: Colace, MVI daily, nystatin 5 mL QID, NS @ 75 mL/hr, azithromycin, ceftriaxone.  Labs reviewed: Chloride 98, Creatinine 1.11 (was 0.82 on 4/2), eGFR 49 (usually >60 on prior labs).  NUTRITION - FOCUSED PHYSICAL EXAM:    Most Recent Value  Orbital Region  Severe depletion  Upper Arm Region  Severe depletion  Thoracic and Lumbar Region  Moderate depletion  Buccal Region  Severe depletion  Temple Region  Severe depletion  Clavicle Bone Region  Severe depletion  Clavicle and Acromion Bone Region  Severe depletion  Scapular Bone Region  Unable to assess  Dorsal Hand  Severe depletion  Patellar Region  Severe depletion  Anterior Thigh Region  Severe depletion  Posterior Calf Region  Moderate depletion  Edema (RD Assessment)  None  Hair  Reviewed Charma Igo loss from chemotherapy]  Eyes  Reviewed  Mouth  Reviewed [right side of mouth/jaw swollen]  Skin  Reviewed [ecchymosis, skin tenting present]  Nails  Reviewed  Diet Order:   Diet Order           Diet Heart Room service appropriate? Yes; Fluid consistency: Thin  Diet effective now          EDUCATION NEEDS:   Education needs have been addressed  Skin:  Skin Assessment: Reviewed RN Assessment(cellulitis to right face; ecchymosis to bilateral arms)  Last BM:  PTA (10/28/2017 per chart)  Height:   Ht Readings  from Last 1 Encounters:  10/29/17 5' 7" (1.702 m)    Weight:   Wt Readings from Last 1 Encounters:  10/29/17 115 lb 11.9 oz (52.5 kg)    Ideal Body Weight:  61.4 kg  BMI:  Body mass index is 18.13 kg/m.  Estimated Nutritional Needs:   Kcal:  1575-1840 (30-35 kcal/kg)  Protein:  80-90 grams (1.5-1.7 grams/kg)  Fluid:  1.6-1.8 L/day (1 mL/kcal)  Willey Blade, MS, RD, LDN Office: [phone] Pager: [phone] After Hours/Weekend Pager: [phone]

## 2017-10-29 NOTE — ED Provider Notes (Signed)
[facility] Emergency Department Provider Note   ____________________________________________   First MD Initiated Contact with Patient 10/29/17 [phone]     (approximate)  I have reviewed the triage vital signs and the nursing notes.   HISTORY  Chief Complaint Insect Bite; Dizziness; Headache; and Cough    HPI Roberta [name] is a 72 y.o. female who presents to the ED from home with multiple medical complaints.  Patient reports mild frontal headache for 1 week.  Complains of dizziness and lightheadedness for the past week.  Reports nonproductive, loose cough for the past 2 weeks.  Noted nonpainful swelling and redness to her right lower jaw yesterday afternoon.  Denies dental pain.  Thinks she may have bitten by an insect.  Denies recent fever, chills, vision changes, neck pain, chest pain, shortness of breath, abdominal pain, nausea, vomiting, dysuria, diarrhea.  Denies recent travel or trauma.   Past Medical History:  Diagnosis Date  . AAA (abdominal aortic aneurysm) ([place])   . Breast cancer ([place]) 2002   left  . COPD (chronic obstructive pulmonary disease) ([place])   . High cholesterol   . Personal history of chemotherapy   . Small cell lung cancer ([place])   . Tobacco abuse     Patient Active Problem List   Diagnosis Date Noted  . Sepsis ([place]) 04/28/2017  . 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 Goals of care, counseling/discussion 02/23/2017  . Lung mass   . AAA (abdominal aortic aneurysm) without rupture ([place]) 11/02/2016  . COPD mixed type ([place]) 11/02/2016  . Hyperlipidemia 11/02/2016  . PAD (peripheral artery disease) ([place]) 11/02/2016    Past Surgical History:  Procedure Laterality Date  . ABDOMINAL HYSTERECTOMY    . ENDOBRONCHIAL ULTRASOUND N/A 02/19/2017   Procedure: ENDOBRONCHIAL ULTRASOUND;  Surgeon: Flora Lipps, MD;  Location: [facility] ORS;  Service: Cardiopulmonary;  Laterality: N/A;  . ENDOVASCULAR REPAIR/STENT GRAFT  N/A 12/06/2016   Procedure: Endovascular Repair/Stent Graft;  Surgeon: Katha Cabal, MD;  Location: [place] [facility];  Service: Cardiovascular;  Laterality: N/A;  . IR FLUORO GUIDE PORT INSERTION RIGHT  03/02/2017  . MASTECTOMY Left 2002   with sentinel node     Prior to Admission medications   Medication Sig Start Date End Date Taking? Authorizing Provider  acetaminophen (TYLENOL) 325 MG tablet Take 650 mg by mouth every 6 (six) hours as needed.    [provider]  albuterol (PROVENTIL HFA;VENTOLIN HFA) 108 (90 Base) MCG/ACT inhaler Inhale 2 puffs into the lungs every 6 (six) hours as needed for wheezing or shortness of breath. 03/23/16   Frederich Cha, MD  aspirin EC 81 MG tablet Take 81 mg by mouth daily.    [provider]  atorvastatin (LIPITOR) 10 MG tablet Take 10 mg by mouth at bedtime.  08/11/16   [provider]  benzonatate (TESSALON) 200 MG capsule Take 1 capsule (200 mg total) by mouth 3 (three) times daily as needed for cough. Patient not taking: Reported on 08/13/2017 06/18/17   Sindy Guadeloupe, MD  dexamethasone (DECADRON) 4 MG tablet Take 2 tablets two times a day for 1 day on day 4 after cisplatin chemotherapy. Take with food. Patient not taking: Reported on 05/31/2017 02/23/17   Sindy Guadeloupe, MD  dexamethasone (DECADRON) 4 MG tablet Take 1 tablet (4 mg total) by mouth daily. Patient not taking: Reported on 08/13/2017 06/11/17   Noreene Filbert, MD  fluticasone-salmeterol (ADVAIR HFA) [phone] MCG/ACT inhaler Inhale 2  puffs into the lungs 2 (two) times daily.    [provider]  guaifenesin (ROBITUSSIN) 100 MG/5ML syrup Take 10 mLs (200 mg total) by mouth 3 (three) times daily as needed for cough. Patient not taking: Reported on 08/13/2017 05/02/17   Demetrios Loll, MD  HYDROcodone-homatropine [facility]) 5-1.5 MG/5ML syrup Take 5 mLs by mouth every 6 (six) hours as needed for cough. Patient not taking: Reported on 08/13/2017 06/18/17   Sindy Guadeloupe,  MD  ipratropium (ATROVENT) 0.02 % nebulizer solution Take 0.5 mg by nebulization 4 (four) times daily.    [provider]  lidocaine-prilocaine (EMLA) cream Apply to affected area once Patient not taking: Reported on 08/13/2017 02/23/17   Sindy Guadeloupe, MD  LORazepam (ATIVAN) 0.5 MG tablet Take 1 tablet (0.5 mg total) by mouth every 6 (six) hours as needed (Nausea or vomiting). Patient not taking: Reported on 06/04/2017 02/23/17   Sindy Guadeloupe, MD  meclizine (ANTIVERT) 25 MG tablet Take 1 tablet (25 mg total) by mouth 3 (three) times daily as needed for dizziness. Patient not taking: Reported on 08/13/2017 06/28/17   Nance Pear, MD  Multiple Vitamin (MULTIVITAMIN) tablet Take 1 tablet by mouth daily.    [provider]  nystatin (MYCOSTATIN) 100000 UNIT/ML suspension Use as directed 5 mLs in the mouth or throat 4 (four) times daily. Swish and swallow 04/24/17   [provider]  ondansetron (ZOFRAN) 8 MG tablet Take 1 tablet (8 mg total) by mouth 2 (two) times daily as needed. Start on the third day after cisplatin chemotherapy. Patient not taking: Reported on 05/31/2017 02/23/17   Sindy Guadeloupe, MD  oxyCODONE (OXY IR/ROXICODONE) 5 MG immediate release tablet Take 1 tablet (5 mg total) by mouth every 6 (six) hours as needed for severe pain. Patient not taking: Reported on 04/28/2017 03/13/17   Sindy Guadeloupe, MD  prochlorperazine (COMPAZINE) 10 MG tablet Take 1 tablet (10 mg total) by mouth every 6 (six) hours as needed (Nausea or vomiting). Patient not taking: Reported on 04/28/2017 02/23/17   Sindy Guadeloupe, MD  sucralfate (CARAFATE) 1 g tablet Take 1 tablet (1 g total) 4 (four) times daily -  with meals and at bedtime by mouth. dissolve in 8 oz of water Patient not taking: Reported on 05/31/2017 04/03/17   Noreene Filbert, MD    Allergies Patient has no known allergies.  Family History  Problem Relation Age of Onset  . Breast cancer Neg Hx     Social History Social  History   Tobacco Use  . Smoking status: Current Every Day Smoker    Packs/day: 0.50    Types: Cigarettes  . Smokeless tobacco: Never Used  Substance Use Topics  . Alcohol use: No  . Drug use: No    Review of Systems  Constitutional: No fever/chills Eyes: No visual changes. ENT: No sore throat. Cardiovascular: Denies chest pain. Respiratory: Positive for nonproductive cough.  Denies shortness of breath. Gastrointestinal: No abdominal pain.  No nausea, no vomiting.  No diarrhea.  No constipation. Genitourinary: Negative for dysuria. Musculoskeletal: Negative for back pain. Skin: Negative for rash. Neurological: Positive for headaches.  Positive for dizziness.  Negative for focal weakness or numbness.   ____________________________________________   PHYSICAL EXAM:  VITAL SIGNS: ED Triage Vitals  Enc Vitals Group     BP 10/29/17 0005 (!) 89/60     Pulse Rate 10/29/17 0005 (!) 113     Resp 10/29/17 0005 18     Temp 10/29/17  0005 97.8 F (36.6 C)     Temp Source 10/29/17 0005 Oral     SpO2 10/29/17 0005 96 %     Weight 10/28/17 2358 115 lb (52.2 kg)     Height 10/28/17 2358 5\' 7"  (1.702 m)     Head Circumference --      Peak Flow --      Pain Score --      Pain Loc --      Pain Edu? --      Excl. in [place]? --     Constitutional: Alert and oriented.  Chronically ill appearing and in no acute distress. Eyes: Conjunctivae are normal. PERRL. EOMI. Head: Atraumatic. Nose: No congestion/rhinnorhea. Mouth/Throat: Mucous membranes are moist.  Poor and missing dentition.  No dental abscess noted to right lower jaw.  Externally there is mild swelling and redness to right lower jaw.  No fluctuance palpated. Neck: No stridor.  Supple neck without meningismus.  No carotid bruits. Cardiovascular: Normal rate, regular rhythm. Grossly normal heart sounds.  Good peripheral circulation. Respiratory: Normal respiratory effort.  No retractions. Lungs CTAB.  Loose rattling cough  noted. Gastrointestinal: Soft and nontender. No distention. No abdominal bruits. No CVA tenderness. Musculoskeletal: No lower extremity tenderness nor edema.  No joint effusions. Neurologic:  Normal speech and language. No gross focal neurologic deficits are appreciated. No gait instability. Skin:  Skin is warm, dry and intact. No rash noted.  No petechiae. Psychiatric: Mood and affect are normal. Speech and behavior are normal.  ____________________________________________   LABS (all labs ordered are listed, but only abnormal results are displayed)  Labs Reviewed  CBC WITH DIFFERENTIAL/PLATELET - Abnormal; Notable for the following components:      Result Value   RDW 15.9 (*)    Monocytes Absolute 1.0 (*)    All other components within normal limits  BASIC METABOLIC PANEL - Abnormal; Notable for the following components:   Chloride 98 (*)    Glucose, Bld 112 (*)    Creatinine, Ser 1.11 (*)    GFR calc non Af Amer 49 (*)    GFR calc Af Amer 57 (*)    All other components within normal limits  CULTURE, BLOOD (ROUTINE X 2)  CULTURE, BLOOD (ROUTINE X 2)  URINE CULTURE  LACTIC ACID, PLASMA  TROPONIN I  LACTIC ACID, PLASMA  URINALYSIS, COMPLETE (UACMP) WITH MICROSCOPIC   ____________________________________________  EKG  ED ECG REPORT I, , J, the attending physician, personally viewed and interpreted this ECG.   Date: 10/29/2017  EKG Time: 0018  Rate: 112  Rhythm: sinus tachycardia  Axis: Normal  Intervals:none  ST&T Change: Nonspecific  ____________________________________________  RADIOLOGY  ED MD interpretation: New left basilar airspace opacity concerning for pneumonia CT head unremarkable; CT maxillofacial demonstrates right periorbital cellulitis without gas or abscess  Official radiology report(s): Dg Chest 2 View  Result Date: 10/29/2017 CLINICAL DATA:  Acute onset of dizziness and headache. Subacute onset of cough. EXAM: CHEST - 2 VIEW COMPARISON:   CT of the chest performed 08/29/2017 FINDINGS: The lungs are well-aerated. Right midlung airspace opacities are similar in appearance and may reflect chronic atypical infection. New left basilar airspace opacity is also seen. No definite pleural effusion or pneumothorax is seen. The heart is normal in size; the mediastinal contour is within normal limits. A left-sided chest port is noted ending about the distal SVC. No acute osseous abnormalities are seen. An abdominal aortic stent is partially characterized. IMPRESSION: Right midlung airspace opacities are similar  in appearance and may reflect chronic atypical infection. New left basilar airspace opacity also seen. Would correlate with the patient's symptoms, and consider follow-up chest radiograph after completion of treatment for pneumonia, to ensure resolution of airspace opacities. Electronically Signed   By: Garald Balding M.D.   On: 10/29/2017 04:27   Ct Head Wo Contrast  Result Date: 10/29/2017 CLINICAL DATA:  Dizziness and headache for 1 week. Cough for 2 weeks. RIGHT lower jaw swelling tonight. History of lung cancer, hypercholesterolemia. EXAM: CT HEAD AND MAXILLOFACIAL WITHOUT AND WITH CONTRAST TECHNIQUE: Contiguous axial images were obtained from the base of the skull through the vertex with and without contrast. Multidetector CT imaging of the face was performed using the standard protocol with and without intravenous contrast. CONTRAST:  27mL ISOVUE-300 IOPAMIDOL (ISOVUE-300) INJECTION 61% COMPARISON:  MRI of the head September 12, 2017 FINDINGS: CT HEAD FINDINGS BRAIN: No intraparenchymal hemorrhage, mass effect nor midline shift. The ventricles and sulci are normal for age. Patchy supratentorial white matter hypodensities less than expected for patient's age, though non-specific are most compatible with chronic small vessel ischemic disease. Small area LEFT parietal encephalomalacia. No acute large vascular territory infarcts. No abnormal  extra-axial fluid collections. Basal cisterns are patent. VASCULAR: Moderate calcific atherosclerosis of the carotid siphons. SKULL: No skull fracture. No significant scalp soft tissue swelling. OTHER: None. CT MAXILLOFACIAL FINDINGS OSSEOUS: The mandible is intact, the condyles are located. No acute facial fracture. No destructive bony lesions. Severe C5-6 and C6-7 spondylosis. Absent maxillary teeth. Poor mandible dentition. ORBITS: Ocular globes and orbital contents are nonsuspicious. Status post bilateral ocular lens implants. SINUSES: Moderate LEFT maxillary sinus mucosal thickening with air-fluid level. Mild LEFT ethmoid sinusitis. LEFT mastoid effusion. SOFT TISSUES: RIGHT perioral soft tissue swelling without subcutaneous gas, radiopaque foreign bodies or focal fluid collections. Moderate calcific atherosclerosis carotid bifurcation. IMPRESSION: CT HEAD: 1. No acute intracranial process. 2. Small LEFT parietal/MCA territory infarcts. 3. Otherwise negative noncontrast CT HEAD for age. CT MAXILLOFACIAL: 1. RIGHT perioral cellulitis without drainable fluid collection. Findings may be odontogenic. Electronically Signed   By: Elon Alas M.D.   On: 10/29/2017 04:51   Ct Maxillofacial W Contrast  Result Date: 10/29/2017 CLINICAL DATA:  Dizziness and headache for 1 week. Cough for 2 weeks. RIGHT lower jaw swelling tonight. History of lung cancer, hypercholesterolemia. EXAM: CT HEAD AND MAXILLOFACIAL WITHOUT AND WITH CONTRAST TECHNIQUE: Contiguous axial images were obtained from the base of the skull through the vertex with and without contrast. Multidetector CT imaging of the face was performed using the standard protocol with and without intravenous contrast. CONTRAST:  58mL ISOVUE-300 IOPAMIDOL (ISOVUE-300) INJECTION 61% COMPARISON:  MRI of the head September 12, 2017 FINDINGS: CT HEAD FINDINGS BRAIN: No intraparenchymal hemorrhage, mass effect nor midline shift. The ventricles and sulci are normal for age.  Patchy supratentorial white matter hypodensities less than expected for patient's age, though non-specific are most compatible with chronic small vessel ischemic disease. Small area LEFT parietal encephalomalacia. No acute large vascular territory infarcts. No abnormal extra-axial fluid collections. Basal cisterns are patent. VASCULAR: Moderate calcific atherosclerosis of the carotid siphons. SKULL: No skull fracture. No significant scalp soft tissue swelling. OTHER: None. CT MAXILLOFACIAL FINDINGS OSSEOUS: The mandible is intact, the condyles are located. No acute facial fracture. No destructive bony lesions. Severe C5-6 and C6-7 spondylosis. Absent maxillary teeth. Poor mandible dentition. ORBITS: Ocular globes and orbital contents are nonsuspicious. Status post bilateral ocular lens implants. SINUSES: Moderate LEFT maxillary sinus mucosal thickening with air-fluid level. Mild  LEFT ethmoid sinusitis. LEFT mastoid effusion. SOFT TISSUES: RIGHT perioral soft tissue swelling without subcutaneous gas, radiopaque foreign bodies or focal fluid collections. Moderate calcific atherosclerosis carotid bifurcation. IMPRESSION: CT HEAD: 1. No acute intracranial process. 2. Small LEFT parietal/MCA territory infarcts. 3. Otherwise negative noncontrast CT HEAD for age. CT MAXILLOFACIAL: 1. RIGHT perioral cellulitis without drainable fluid collection. Findings may be odontogenic. Electronically Signed   By: Elon Alas M.D.   On: 10/29/2017 04:51    ____________________________________________   PROCEDURES  Procedure(s) performed: None  Procedures  Critical Care performed:   CRITICAL CARE Performed by: Paulette Blanch   Total critical care time: 30 minutes  Critical care time was exclusive of separately billable procedures and treating other patients.  Critical care was necessary to treat or prevent imminent or life-threatening deterioration.  Critical care was time spent personally by me on the  following activities: development of treatment plan with patient and/or surrogate as well as nursing, discussions with consultants, evaluation of patient's response to treatment, examination of patient, obtaining history from patient or surrogate, ordering and performing treatments and interventions, ordering and review of laboratory studies, ordering and review of radiographic studies, pulse oximetry and re-evaluation of patient's condition.  ____________________________________________   INITIAL IMPRESSION / ASSESSMENT AND PLAN / ED COURSE  As part of my medical decision making, I reviewed the following data within the [place] notes reviewed and incorporated, Labs reviewed, EKG interpreted, Old chart reviewed, Radiograph reviewed and Notes from prior ED visits   72 year old female with lung cancer in remission status post last chemotherapy in January 2019 who presents with a one-week history of generalized headache, dizziness and a 2-week history of nonproductive cough.  1 day history of redness and swelling to her right lower jaw.  Differential diagnosis includes but is not limited to [place], brain metastasis, dehydration, pneumonia, UTI, insect bite, dental abscess, etc.  Laboratory results unremarkable; normal WBC, normal lactate.  I have added a troponin and urinalysis.  Will obtain CT head to evaluate for metastasis, and CT maxillofacial to evaluate for abscess.   Clinical Course as of Oct 29 520  Mon Oct 29, 2017  [id number] Patient was orthostatic.  IV fluids infusing.  Anticipate hospitalization.  Results of CT scan pending.  Meets criteria for code sepsis based on hypotension and tachycardia.   [JS]    Clinical Course User Index [JS] Paulette Blanch, MD     ____________________________________________   FINAL CLINICAL IMPRESSION(S) / ED DIAGNOSES  Final diagnoses:  Dizziness  Community acquired pneumonia of left lower lobe of lung ([place])  Sepsis, due to  unspecified organism [facility])  Orthostasis  Facial cellulitis     ED Discharge Orders    None       Note:  This document was prepared using Dragon voice recognition software and may include unintentional dictation errors.    Paulette Blanch, MD 10/29/17 [phone]

## 2017-10-29 NOTE — H&P (Signed)
[facility] NAME: Naziya Hegwood    MR#:  [id number]  DATE OF BIRTH:  [birth year]  DATE OF ADMISSION:  10/29/2017  PRIMARY CARE PHYSICIAN: Gayland Curry, MD   REQUESTING/REFERRING PHYSICIAN: Dr. Lurline Hare  CHIEF COMPLAINT:   Chief Complaint  Patient presents with  . Insect Bite  . Dizziness  . Headache  . Cough    HISTORY OF PRESENT ILLNESS:  Roberta [name]  is a 72 y.o. 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 Patient stays at home with her family, independent at baseline.  Does not use any home oxygen.  Recently finished 4 cycles of chemo for her lung cancer and is considered to be in remission, last MRI of the brain was negative for any mets.  2 weeks ago she started having on and off headaches.  Last week she has noticed swelling on the right side of her face.  Also having worsening cough, congestion and shortness of breath.  Denies any fevers or chills.  Mild nausea but no vomiting or abdominal pain.  No recent travel.  Also complains of significant dizziness.  Chest x-ray consistent with left basilar pneumonia.  PAST MEDICAL HISTORY:   Past Medical History:  Diagnosis Date  . AAA (abdominal aortic aneurysm) ([place])   . Breast cancer ([place]) 2002   left  . COPD (chronic obstructive pulmonary disease) ([place])   . High cholesterol   . Personal history of chemotherapy   . Small cell lung cancer ([place])   . Tobacco abuse     PAST SURGICAL HISTORY:   Past Surgical History:  Procedure Laterality Date  . ABDOMINAL HYSTERECTOMY    . ENDOBRONCHIAL ULTRASOUND N/A 02/19/2017   Procedure: ENDOBRONCHIAL ULTRASOUND;  Surgeon: Flora Lipps, MD;  Location: [facility] ORS;   Service: Cardiopulmonary;  Laterality: N/A;  . ENDOVASCULAR REPAIR/STENT GRAFT N/A 12/06/2016   Procedure: Endovascular Repair/Stent Graft;  Surgeon: Katha Cabal, MD;  Location: [facility];  Service: Cardiovascular;  Laterality: N/A;  . IR FLUORO GUIDE PORT INSERTION RIGHT  03/02/2017  . MASTECTOMY Left 2002   with sentinel node     SOCIAL HISTORY:   Social History   Tobacco Use  . Smoking status: Current Every Day Smoker    Packs/day: 0.50    Types: Cigarettes  . Smokeless tobacco: Never Used  Substance Use Topics  . Alcohol use: No    FAMILY HISTORY:   Family History  Problem Relation Age of Onset  . CAD Mother   . Colon cancer Brother   . Breast cancer Neg Hx     DRUG ALLERGIES:  No Known Allergies  REVIEW OF SYSTEMS:   Review of Systems  Constitutional: Positive for malaise/fatigue. Negative for chills, fever and weight loss.  HENT: Positive for hearing loss. Negative for ear discharge, ear pain, nosebleeds and tinnitus.   Eyes: Negative for blurred vision, double vision and photophobia.  Respiratory: Positive for cough and shortness of breath. Negative for hemoptysis and wheezing.   Cardiovascular: Negative for chest pain, palpitations, orthopnea and leg swelling.  Gastrointestinal: Positive for nausea. Negative for abdominal pain, constipation, diarrhea, heartburn, melena and vomiting.  Genitourinary: Negative for dysuria, frequency, hematuria and urgency.  Musculoskeletal: Negative for  back pain, myalgias and neck pain.  Skin: Negative for rash.  Neurological: Positive for dizziness and headaches. Negative for tingling, tremors, sensory change, speech change and focal weakness.  Endo/Heme/Allergies: Does not bruise/bleed easily.  Psychiatric/Behavioral: Negative for depression.    MEDICATIONS AT HOME:   Prior to Admission medications   Medication Sig Start Date End Date Taking? Authorizing Provider  acetaminophen (TYLENOL) 325 MG tablet Take  650 mg by mouth every 6 (six) hours as needed for mild pain.    Yes [provider]  albuterol (PROVENTIL HFA;VENTOLIN HFA) 108 (90 Base) MCG/ACT inhaler Inhale 2 puffs into the lungs every 6 (six) hours as needed for wheezing or shortness of breath. 03/23/16  Yes Frederich Cha, MD  aspirin EC 81 MG tablet Take 81 mg by mouth daily.   Yes [provider]  atorvastatin (LIPITOR) 10 MG tablet Take 10 mg by mouth at bedtime.  08/11/16  Yes [provider]  fluticasone-salmeterol (ADVAIR HFA) 115-21 MCG/ACT inhaler Inhale 2 puffs into the lungs 2 (two) times daily.   Yes [provider]  ipratropium (ATROVENT) 0.02 % nebulizer solution Take 0.5 mg by nebulization 4 (four) times daily.   Yes [provider]  Multiple Vitamin (MULTIVITAMIN) tablet Take 1 tablet by mouth daily.   Yes [provider]  benzonatate (TESSALON) 200 MG capsule Take 1 capsule (200 mg total) by mouth 3 (three) times daily as needed for cough. Patient not taking: Reported on 08/13/2017 06/18/17   Sindy Guadeloupe, MD  dexamethasone (DECADRON) 4 MG tablet Take 2 tablets two times a day for 1 day on day 4 after cisplatin chemotherapy. Take with food. Patient not taking: Reported on 05/31/2017 02/23/17   Sindy Guadeloupe, MD  dexamethasone (DECADRON) 4 MG tablet Take 1 tablet (4 mg total) by mouth daily. Patient not taking: Reported on 08/13/2017 06/11/17   Noreene Filbert, MD  guaifenesin (ROBITUSSIN) 100 MG/5ML syrup Take 10 mLs (200 mg total) by mouth 3 (three) times daily as needed for cough. Patient not taking: Reported on 08/13/2017 05/02/17   Demetrios Loll, MD  HYDROcodone-homatropine [facility]) 5-1.5 MG/5ML syrup Take 5 mLs by mouth every 6 (six) hours as needed for cough. Patient not taking: Reported on 08/13/2017 06/18/17   Sindy Guadeloupe, MD  lidocaine-prilocaine (EMLA) cream Apply to affected area once Patient not taking: Reported on 08/13/2017 02/23/17   Sindy Guadeloupe, MD  LORazepam  (ATIVAN) 0.5 MG tablet Take 1 tablet (0.5 mg total) by mouth every 6 (six) hours as needed (Nausea or vomiting). Patient not taking: Reported on 06/04/2017 02/23/17   Sindy Guadeloupe, MD  meclizine (ANTIVERT) 25 MG tablet Take 1 tablet (25 mg total) by mouth 3 (three) times daily as needed for dizziness. Patient not taking: Reported on 08/13/2017 06/28/17   Nance Pear, MD  ondansetron (ZOFRAN) 8 MG tablet Take 1 tablet (8 mg total) by mouth 2 (two) times daily as needed. Start on the third day after cisplatin chemotherapy. Patient not taking: Reported on 05/31/2017 02/23/17   Sindy Guadeloupe, MD  oxyCODONE (OXY IR/ROXICODONE) 5 MG immediate release tablet Take 1 tablet (5 mg total) by mouth every 6 (six) hours as needed for severe pain. Patient not taking: Reported on 04/28/2017 03/13/17   Sindy Guadeloupe, MD  prochlorperazine (COMPAZINE) 10 MG tablet Take 1 tablet (10 mg total) by mouth every 6 (six) hours as needed (Nausea or vomiting). Patient not taking: Reported on 04/28/2017 02/23/17   Sindy Guadeloupe, MD  sucralfate (CARAFATE) 1 g tablet Take 1 tablet (1 g total) 4 (four) times daily -  with meals and at bedtime by mouth. dissolve in 8 oz of water Patient not taking: Reported on 05/31/2017 04/03/17   Noreene Filbert, MD      VITAL SIGNS:  Blood pressure (!) 141/81, pulse (!) 103, temperature 97.8 F (36.6 C), temperature source Oral, resp. rate 18, height 5\' 7"  (1.702 m), weight 52.5 kg (115 lb 11.9 oz), SpO2 94 %.  PHYSICAL EXAMINATION:   Physical Exam  GENERAL:  72 y.o.-year-old ill nourished patient lying in the bed with no acute distress.  EYES: Pupils equal, round, reactive to light and accommodation. No scleral icterus. Extraocular muscles intact.  HEENT: Head atraumatic, normocephalic. Right facial swelling noted.  nasopharynx clear. Several ulcerations on right side of the palate and right buccal mucosa NECK:  Supple, no jugular venous distention. No thyroid enlargement, no tenderness.    LUNGS: Normal breath sounds bilaterally, no wheezing, rales,rhonchi or crepitation. No use of accessory muscles of respiration. Decreased bibasilar breath sounds CARDIOVASCULAR: S1, S2 normal. No murmurs, rubs, or gallops.  ABDOMEN: Soft, nontender, nondistended. Bowel sounds present. No organomegaly or mass.  EXTREMITIES: No pedal edema, cyanosis, or clubbing.  NEUROLOGIC: Cranial nerves II through XII are intact. Muscle strength 5/5 in all extremities. Sensation intact. Gait not checked.  PSYCHIATRIC: The patient is alert and oriented x 3.  SKIN: No obvious rash, lesion, or ulcer.   LABORATORY PANEL:   CBC Recent Labs  Lab 10/29/17 0012  WBC 7.7  HGB 12.6  HCT 38.3  PLT 390   ------------------------------------------------------------------------------------------------------------------  Chemistries  Recent Labs  Lab 10/29/17 0012  NA 137  K 3.9  CL 98*  CO2 26  GLUCOSE 112*  BUN 14  CREATININE 1.11*  CALCIUM 9.1   ------------------------------------------------------------------------------------------------------------------  Cardiac Enzymes Recent Labs  Lab 10/29/17 0012  TROPONINI <0.03   ------------------------------------------------------------------------------------------------------------------  RADIOLOGY:  Dg Chest 2 View  Result Date: 10/29/2017 CLINICAL DATA:  Acute onset of dizziness and headache. Subacute onset of cough. EXAM: CHEST - 2 VIEW COMPARISON:  CT of the chest performed 08/29/2017 FINDINGS: The lungs are well-aerated. Right midlung airspace opacities are similar in appearance and may reflect chronic atypical infection. New left basilar airspace opacity is also seen. No definite pleural effusion or pneumothorax is seen. The heart is normal in size; the mediastinal contour is within normal limits. A left-sided chest port is noted ending about the distal SVC. No acute osseous abnormalities are seen. An abdominal aortic stent is partially  characterized. IMPRESSION: Right midlung airspace opacities are similar in appearance and may reflect chronic atypical infection. New left basilar airspace opacity also seen. Would correlate with the patient's symptoms, and consider follow-up chest radiograph after completion of treatment for pneumonia, to ensure resolution of airspace opacities. Electronically Signed   By: Garald Balding M.D.   On: 10/29/2017 04:27   Ct Head Wo Contrast  Result Date: 10/29/2017 CLINICAL DATA:  Dizziness and headache for 1 week. Cough for 2 weeks. RIGHT lower jaw swelling tonight. History of lung cancer, hypercholesterolemia. EXAM: CT HEAD AND MAXILLOFACIAL WITHOUT AND WITH CONTRAST TECHNIQUE: Contiguous axial images were obtained from the base of the skull through the vertex with and without contrast. Multidetector CT imaging of the face was performed using the standard protocol with and without intravenous contrast. CONTRAST:  54mL ISOVUE-300 IOPAMIDOL (ISOVUE-300) INJECTION 61% COMPARISON:  MRI of the head September 12, 2017 FINDINGS: CT HEAD FINDINGS BRAIN: No intraparenchymal hemorrhage,  mass effect nor midline shift. The ventricles and sulci are normal for age. Patchy supratentorial white matter hypodensities less than expected for patient's age, though non-specific are most compatible with chronic small vessel ischemic disease. Small area LEFT parietal encephalomalacia. No acute large vascular territory infarcts. No abnormal extra-axial fluid collections. Basal cisterns are patent. VASCULAR: Moderate calcific atherosclerosis of the carotid siphons. SKULL: No skull fracture. No significant scalp soft tissue swelling. OTHER: None. CT MAXILLOFACIAL FINDINGS OSSEOUS: The mandible is intact, the condyles are located. No acute facial fracture. No destructive bony lesions. Severe C5-6 and C6-7 spondylosis. Absent maxillary teeth. Poor mandible dentition. ORBITS: Ocular globes and orbital contents are nonsuspicious. Status post  bilateral ocular lens implants. SINUSES: Moderate LEFT maxillary sinus mucosal thickening with air-fluid level. Mild LEFT ethmoid sinusitis. LEFT mastoid effusion. SOFT TISSUES: RIGHT perioral soft tissue swelling without subcutaneous gas, radiopaque foreign bodies or focal fluid collections. Moderate calcific atherosclerosis carotid bifurcation. IMPRESSION: CT HEAD: 1. No acute intracranial process. 2. Small LEFT parietal/MCA territory infarcts. 3. Otherwise negative noncontrast CT HEAD for age. CT MAXILLOFACIAL: 1. RIGHT perioral cellulitis without drainable fluid collection. Findings may be odontogenic. Electronically Signed   By: Elon Alas M.D.   On: 10/29/2017 04:51   Ct Maxillofacial W Contrast  Result Date: 10/29/2017 CLINICAL DATA:  Dizziness and headache for 1 week. Cough for 2 weeks. RIGHT lower jaw swelling tonight. History of lung cancer, hypercholesterolemia. EXAM: CT HEAD AND MAXILLOFACIAL WITHOUT AND WITH CONTRAST TECHNIQUE: Contiguous axial images were obtained from the base of the skull through the vertex with and without contrast. Multidetector CT imaging of the face was performed using the standard protocol with and without intravenous contrast. CONTRAST:  55mL ISOVUE-300 IOPAMIDOL (ISOVUE-300) INJECTION 61% COMPARISON:  MRI of the head September 12, 2017 FINDINGS: CT HEAD FINDINGS BRAIN: No intraparenchymal hemorrhage, mass effect nor midline shift. The ventricles and sulci are normal for age. Patchy supratentorial white matter hypodensities less than expected for patient's age, though non-specific are most compatible with chronic small vessel ischemic disease. Small area LEFT parietal encephalomalacia. No acute large vascular territory infarcts. No abnormal extra-axial fluid collections. Basal cisterns are patent. VASCULAR: Moderate calcific atherosclerosis of the carotid siphons. SKULL: No skull fracture. No significant scalp soft tissue swelling. OTHER: None. CT MAXILLOFACIAL FINDINGS  OSSEOUS: The mandible is intact, the condyles are located. No acute facial fracture. No destructive bony lesions. Severe C5-6 and C6-7 spondylosis. Absent maxillary teeth. Poor mandible dentition. ORBITS: Ocular globes and orbital contents are nonsuspicious. Status post bilateral ocular lens implants. SINUSES: Moderate LEFT maxillary sinus mucosal thickening with air-fluid level. Mild LEFT ethmoid sinusitis. LEFT mastoid effusion. SOFT TISSUES: RIGHT perioral soft tissue swelling without subcutaneous gas, radiopaque foreign bodies or focal fluid collections. Moderate calcific atherosclerosis carotid bifurcation. IMPRESSION: CT HEAD: 1. No acute intracranial process. 2. Small LEFT parietal/MCA territory infarcts. 3. Otherwise negative noncontrast CT HEAD for age. CT MAXILLOFACIAL: 1. RIGHT perioral cellulitis without drainable fluid collection. Findings may be odontogenic. Electronically Signed   By: Elon Alas M.D.   On: 10/29/2017 04:51    EKG:   Orders placed or performed during the hospital encounter of 10/29/17  . EKG 12-Lead  . EKG 12-Lead    IMPRESSION AND PLAN:   Roberta [name]  is a 72 y.o. female with a known history of small cell lung cancer status post 4 cycles of chemo, currently in remission, history of AAA status post stent placement, history of breast cancer several years ago in remission, COPD, ongoing smoking  presents to hospital secondary to headaches, right facial swelling and cough.  1.  Community-acquired pneumonia-admit, blood cultures -Continue azithromycin and Rocephin.  Gentle hydration.  Lactic acid and WBC within normal limits  2.  Right perioral cellulitis--poor oral hygiene and also likely aphthous ulcerations on the right side of buccal cavity -Continue antibiotics, if not improving, will add clindamycin - add nystatin for ulcerations  3.  Headache and dizziness-likely could be from underlying infection. -If not improving, will order MRI of the brain to  rule out metastatic disease especially with known history of small cell lung cancer.  Last MRI 2 months ago was negative  4.  Small cell lung cancer-following with oncology.  Last visit was 2 months ago.  Next visit scheduled for next month  5.  COPD-ongoing smoking.  Start on nicotine patch -No exacerbation, no indication for systemic steroids -Continue inhalers  6.  DVT prophylaxis-Lovenox    All the records are reviewed and case discussed with ED provider. Management plans discussed with the patient, family and they are in agreement.  CODE STATUS: Full Code  TOTAL TIME TAKING CARE OF THIS PATIENT: 50 minutes.    Gladstone Lighter M.D on 10/29/2017 at 2:34 PM  Between 7am to 6pm - Pager - [phone]  After 6pm go to [email or web address] - password [email or web address]  Office  [phone]  CC: Primary care physician; Gayland Curry, MD

## 2017-10-30 LAB — PROCALCITONIN: Procalcitonin: <0.1 ng/mL

## 2017-10-30 LAB — BASIC METABOLIC PANEL
Anion gap: 8 (ref 5–15)
BUN: 7 mg/dL (ref 6–20)
CO2: 25 mmol/L (ref 22–32)
CREATININE: 0.66 mg/dL (ref 0.44–1.00)
Calcium: 8.1 mg/dL — ABNORMAL LOW (ref 8.9–10.3)
Chloride: 104 mmol/L (ref 101–111)
GFR calc non Af Amer: >60 mL/min (ref >60)
Glucose, Bld: 94 mg/dL (ref 65–99)
POTASSIUM: 3.5 mmol/L (ref 3.5–5.1)
Sodium: 137 mmol/L (ref 135–145)

## 2017-10-30 LAB — URINE CULTURE

## 2017-10-30 LAB — CBC
HEMATOCRIT: 33.5 % — AB (ref 35.0–47.0)
HEMOGLOBIN: 11.4 g/dL — AB (ref 12.0–16.0)
MCH: 30.3 pg (ref 26.0–34.0)
MCHC: 33.9 g/dL (ref 32.0–36.0)
MCV: 89.3 fL (ref 80.0–100.0)
PLATELETS: 336 10*3/uL (ref 150–440)
RBC: 3.76 MIL/uL — AB (ref 3.80–5.20)
RDW: 16.3 % — ABNORMAL HIGH (ref 11.5–14.5)
WBC: 5.2 10*3/uL (ref 3.6–11.0)

## 2017-10-30 MED ORDER — CEFUROXIME AXETIL 500 MG PO TABS: 500.0000 mg | ORAL_TABLET | Freq: Two times a day (BID) | ORAL | 0 refills | Status: AC

## 2017-10-30 MED ORDER — GUAIFENESIN ER 600 MG PO TB12: 600.0000 mg | ORAL_TABLET | Freq: Two times a day (BID) | ORAL | 0 refills | Status: DC

## 2017-10-30 MED ORDER — HYDROCOD POLST-CPM POLST ER 10-8 MG/5ML PO SUER: 5.0000 mL | Freq: Two times a day (BID) | ORAL | 0 refills | Status: DC

## 2017-10-30 MED ORDER — NYSTATIN 100000 UNIT/ML MT SUSP: 5.0000 mL | Freq: Four times a day (QID) | OROMUCOSAL | 0 refills | Status: DC

## 2017-10-30 MED ORDER — BENZONATATE 200 MG PO CAPS: 200.0000 mg | ORAL_CAPSULE | Freq: Three times a day (TID) | ORAL | 0 refills | Status: DC | PRN

## 2017-10-30 MED ORDER — PREDNISONE 10 MG (21) PO TBPK: ORAL_TABLET | ORAL | 0 refills | Status: DC

## 2017-10-30 NOTE — Plan of Care (Signed)
  Problem: Education: Goal: Knowledge of General Education information will improve Outcome: Progressing   Problem: Health Behavior/Discharge Planning: Goal: Ability to manage health-related needs will improve Outcome: Progressing   Problem: Clinical Measurements: Goal: Ability to maintain clinical measurements within normal limits will improve Outcome: Progressing   

## 2017-10-30 NOTE — Discharge Summary (Signed)
[facility] NAME: Roberta [name]    MR#:  [id number]  DATE OF BIRTH:  [birth year]  DATE OF ADMISSION:  10/29/2017   ADMITTING PHYSICIAN: Gladstone Lighter, MD  DATE OF DISCHARGE: 10/30/17  PRIMARY CARE PHYSICIAN: Gayland Curry, MD   ADMISSION DIAGNOSIS:   Dizziness [R42] Orthostasis [I95.1] Facial cellulitis [W09.811] Sepsis, due to unspecified organism ([place]) [A41.9] Community acquired pneumonia of left lower lobe of lung ([place]) [J18.1]  DISCHARGE DIAGNOSIS:   Active Problems:   Sepsis ([place])   Protein-calorie malnutrition, severe   SECONDARY DIAGNOSIS:   Past Medical History:  Diagnosis Date  . AAA (abdominal aortic aneurysm) ([place])   . Breast cancer ([place]) 2002   left  . COPD (chronic obstructive pulmonary disease) ([place])   . High cholesterol   . Personal history of chemotherapy   . Small cell lung cancer ([place])   . Tobacco abuse     HOSPITAL COURSE:   Roberta [name]  is a 72 y.o. 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  1.  Community-acquired pneumonia- doing better, negative blood cultures -received azithromycin and Rocephin.   - discharge on ceftin  -Lactic acid and WBC within normal limits -Not needing supplemental oxygen -Ambulating well on room air  2.  Right perioral cellulitis--poor oral hygiene and also likely aphthous ulcerations on the right side of buccal cavity -Much improved with nystatin swish and swallow.  We will continue that  3.  Headache and dizziness-likely could be from underlying infection. - much improved now - CT head negative for any acute findings -If not improving, will need MRI of the brain to rule out  metastatic disease especially with known history of small cell lung cancer.  Last MRI 2 months ago was negative  4.  Small cell lung cancer-following with oncology.  Last visit was 2 months ago.  Next visit scheduled for next month  5.  COPD-ongoing smoking. counseled -Continue inhalers - prednisone taper at discharge as minimal wheezing today  Ambulated well with physical therapy.  Will discharge home with home health    DISCHARGE CONDITIONS:   Guarded'  CONSULTS OBTAINED:    None  DRUG ALLERGIES:   No Known Allergies DISCHARGE MEDICATIONS:   Allergies as of 10/30/2017   No Known Allergies     Medication List    STOP taking these medications   dexamethasone 4 MG tablet Commonly known as:  DECADRON   guaifenesin 100 MG/5ML syrup Commonly known as:  ROBITUSSIN Replaced by:  guaiFENesin 600 MG 12 hr tablet   HYDROcodone-homatropine 5-1.5 MG/5ML syrup Commonly known as:  HYCODAN   ipratropium 0.02 % nebulizer solution Commonly known as:  ATROVENT   lidocaine-prilocaine cream Commonly known as:  EMLA   LORazepam 0.5 MG tablet Commonly known as:  ATIVAN   meclizine 25 MG tablet Commonly known as:  ANTIVERT   ondansetron 8 MG tablet Commonly known as:  ZOFRAN   oxyCODONE 5 MG immediate release tablet Commonly known as:  Oxy IR/ROXICODONE   prochlorperazine 10 MG tablet Commonly known as:  COMPAZINE   sucralfate 1 g tablet Commonly known as:  CARAFATE     TAKE these medications   acetaminophen 325 MG tablet Commonly known as:  TYLENOL Take 650 mg by mouth every 6 (six) hours as needed for mild pain.   albuterol 108 (90 Base) MCG/ACT inhaler Commonly known as:  PROVENTIL HFA;VENTOLIN HFA Inhale 2 puffs into the lungs every 6 (six) hours as needed for wheezing or shortness of breath.   aspirin EC 81 MG tablet Take 81 mg by mouth daily.   atorvastatin 10 MG tablet Commonly known as:  LIPITOR Take 10 mg by mouth at bedtime.   benzonatate 200 MG  capsule Commonly known as:  TESSALON Take 1 capsule (200 mg total) by mouth 3 (three) times daily as needed for cough.   cefUROXime 500 MG tablet Commonly known as:  CEFTIN Take 1 tablet (500 mg total) by mouth 2 (two) times daily with a meal for 7 days.   chlorpheniramine-HYDROcodone 10-8 MG/5ML Suer Commonly known as:  TUSSIONEX Take 5 mLs by mouth every 12 (twelve) hours.   fluticasone-salmeterol 115-21 MCG/ACT inhaler Commonly known as:  ADVAIR HFA Inhale 2 puffs into the lungs 2 (two) times daily.   guaiFENesin 600 MG 12 hr tablet Commonly known as:  MUCINEX Take 1 tablet (600 mg total) by mouth 2 (two) times daily. Replaces:  guaifenesin 100 MG/5ML syrup   multivitamin tablet Take 1 tablet by mouth daily.   nystatin 100000 UNIT/ML suspension Commonly known as:  MYCOSTATIN Use as directed 5 mLs (500,000 Units total) in the mouth or throat 4 (four) times daily.   predniSONE 10 MG (21) Tbpk tablet Commonly known as:  STERAPRED UNI-PAK 21 TAB 6 tabs PO x 1 day 5 tabs PO x 1 day 4 tabs PO x 1 day 3 tabs PO x 1 day 2 tabs PO x 1 day 1 tab PO x 1 day and stop        DISCHARGE INSTRUCTIONS:   1. PCP f/u in 1-2 weeks 2. Oncology f/u in 2 weeks  DIET:   Regular diet  ACTIVITY:   Activity as tolerated  OXYGEN:   Home Oxygen: No.  Oxygen Delivery: room air  DISCHARGE LOCATION:   home   If you experience worsening of your admission symptoms, develop shortness of breath, life threatening emergency, suicidal or homicidal thoughts you must seek medical attention immediately by calling 911 or calling your MD immediately  if symptoms less severe.  You Must read complete instructions/literature along with all the possible adverse reactions/side effects for all the Medicines you take and that have been prescribed to you. Take any new Medicines after you have completely understood and accpet all the possible adverse reactions/side effects.   Please note  You were  cared for by a hospitalist during your hospital stay. If you have any questions about your discharge medications or the care you received while you were in the hospital after you are discharged, you can call the unit and asked to speak with the hospitalist on call if the hospitalist that took care of you is not available. Once you are discharged, your primary care physician will handle any further medical issues. Please note that NO REFILLS for any discharge medications will be authorized once you are discharged, as it is imperative that you return to your primary care physician (or establish a relationship with a primary care physician if you do not have one) for your aftercare needs so that they can reassess your need for medications and monitor your lab values.    On the day of Discharge:  VITAL SIGNS:   Blood pressure 124/73, pulse (!) 105, temperature  98.2 F (36.8 C), temperature source Oral, resp. rate (!) 21, height 5\' 7"  (1.702 m), weight 52.5 kg (115 lb 11.9 oz), SpO2 93 %.  PHYSICAL EXAMINATION:   GENERAL:  72 y.o.-year-old malnourished patient lying in the bed with no acute distress.  EYES: Pupils equal, round, reactive to light and accommodation. No scleral icterus. Extraocular muscles intact.  HEENT: Head atraumatic, normocephalic. much improved  Right cheek swelling noted.  nasopharynx clear. healed ulcerations on right side of the palate and right buccal mucosa NECK:  Supple, no jugular venous distention. No thyroid enlargement, no tenderness.  LUNGS: Normal breath sounds bilaterally, no wheezing, rales,rhonchi or crepitation. No use of accessory muscles of respiration. Decreased bibasilar breath sounds CARDIOVASCULAR: S1, S2 normal. No murmurs, rubs, or gallops.  ABDOMEN: Soft, nontender, nondistended. Bowel sounds present. No organomegaly or mass.  EXTREMITIES: No pedal edema, cyanosis, or clubbing.  NEUROLOGIC: Cranial nerves II through XII are intact. Muscle strength 5/5 in  all extremities. Sensation intact. Gait not checked.  PSYCHIATRIC: The patient is alert and oriented x 3.  SKIN: No obvious rash, lesion, or ulcer   DATA REVIEW:   CBC Recent Labs  Lab 10/30/17 0426  WBC 5.2  HGB 11.4*  HCT 33.5*  PLT 336    Chemistries  Recent Labs  Lab 10/30/17 0426  NA 137  K 3.5  CL 104  CO2 25  GLUCOSE 94  BUN 7  CREATININE 0.66  CALCIUM 8.1*     Microbiology Results  Results for orders placed or performed during the hospital encounter of 10/29/17  Blood Culture (routine x 2)     Status: None (Preliminary result)   Collection Time: 10/29/17  5:12 AM  Result Value Ref Range Status   Specimen Description BLOOD RIGHT ANTECUBITAL  Final   Special Requests   Final    BOTTLES DRAWN AEROBIC AND ANAEROBIC Blood Culture adequate volume   Culture   Final    NO GROWTH 1 DAY Performed at [facility], [address], [place], [place] [address]    Report Status PENDING  Incomplete  Blood Culture (routine x 2)     Status: None (Preliminary result)   Collection Time: 10/29/17  5:12 AM  Result Value Ref Range Status   Specimen Description BLOOD BLOOD RIGHT HAND  Final   Special Requests   Final    BOTTLES DRAWN AEROBIC AND ANAEROBIC Blood Culture results may not be optimal due to an inadequate volume of blood received in culture bottles   Culture   Final    NO GROWTH 1 DAY Performed at [facility] [address], [place], [place] [address]    Report Status PENDING  Incomplete  Urine culture     Status: Abnormal   Collection Time: 10/29/17 10:38 AM  Result Value Ref Range Status   Specimen Description   Final    URINE, RANDOM Performed at [facility], [address] [place], [place] [address]    Special Requests   Final    NONE Performed at [facility], [address] [place], [place] [address]    Culture MULTIPLE SPECIES PRESENT, SUGGEST RECOLLECTION (A)  Final   Report Status 10/30/2017 FINAL   Final    RADIOLOGY:  No results found.   Management plans discussed with the patient, family and they are in agreement.  CODE STATUS:     Code Status Orders  (From admission, onward)        Start  Ordered   10/29/17 0804  Full code  Continuous     10/29/17 0804    Code Status History    Date Active Date Inactive Code Status Order ID Comments User Context   04/28/2017 1146 05/02/2017 1531 DNR [id number]  Nicholes Mango, MD ED   12/06/2016 1117 12/07/2016 1547 Full Code [id number]  Dew, Erskine Squibb, MD Inpatient      TOTAL TIME TAKING CARE OF THIS PATIENT: 38 minutes.    Gladstone Lighter M.D on 10/30/2017 at 12:38 PM  Between 7am to 6pm - Pager - [phone]  After 6pm go to [email or web address] - Proofreader  [facility]  Office  [phone]  CC: Primary care physician; Gayland Curry, MD   Note: This dictation was prepared with Dragon dictation along with smaller phrase technology. Any transcriptional errors that result from this process are unintentional.

## 2017-10-30 NOTE — Evaluation (Signed)
Physical Therapy Evaluation Patient Details Name: Roberta [name] MRN: [id number] DOB: 16-Nov-1945 Today's Date: 10/30/2017   History of Present Illness  Pt admitted for sepsis. Pt with complaints of dizziness occasionally, HA, cough, and facial swelling. Reports insect bite a few days ago. Per CT, L parietal/MCA infarct, unsure of acute in nature. History includes lung cancer x 4 cycles of chemo, AAA, breast cancer, and COPD. Cleared to work with pt per Conservation officer, historic buildings.  Clinical Impression  Pt is a pleasant 72 year old female who was admitted for sepsis. Pt demonstrates all bed mobility/transfers/ambulation at baseline level. No strength/mobility deficits noted. Pt does not require any further PT needs at this time. Pt will be dc in house and does not require follow up. RN aware. Will dc current orders.     Follow Up Recommendations No PT follow up    Equipment Recommendations  None recommended by PT    Recommendations for Other Services       Precautions / Restrictions Precautions Precautions: Fall Restrictions Weight Bearing Restrictions: No      Mobility  Bed Mobility Overal bed mobility: Independent             General bed mobility comments: safe technique performed  Transfers Overall transfer level: Independent               General transfer comment: safe technique performed without AD.   Ambulation/Gait Ambulation/Gait assistance: Min guard Ambulation Distance (Feet): 200 Feet Assistive device: None Gait Pattern/deviations: WFL(Within Functional Limits)     General Gait Details: reciprocal gait pattern performed without LOB noted. Able to carry conversation during mobility efforts. No AD used  Financial trader Rankin (Stroke Patients Only)       Balance Overall balance assessment: Independent                                           Pertinent Vitals/Pain Pain Assessment: No/denies pain     Home Living Family/patient expects to be discharged to:: Private residence Living Arrangements: (lives with sister) Available Help at Discharge: Family(has great family support ) Type of Home: Apartment Home Access: Level entry     Home Layout: One level Home Equipment: Grab bars - tub/shower;Grab bars - toilet      Prior Function Level of Independence: Independent         Comments: Pt denies any falls in past 6 months.     Hand Dominance        Extremity/Trunk Assessment   Upper Extremity Assessment Upper Extremity Assessment: Overall WFL for tasks assessed    Lower Extremity Assessment Lower Extremity Assessment: Overall WFL for tasks assessed       Communication   Communication: No difficulties  Cognition Arousal/Alertness: Awake/alert Behavior During Therapy: WFL for tasks assessed/performed Overall Cognitive Status: Within Functional Limits for tasks assessed                                        General Comments      Exercises     Assessment/Plan    PT Assessment Patent does not need any further PT services  PT Problem List         PT Treatment  Interventions      PT Goals (Current goals can be found in the Care Plan section)  Acute Rehab PT Goals Patient Stated Goal: to go home PT Goal Formulation: All assessment and education complete, DC therapy Time For Goal Achievement: 10/30/17 Potential to Achieve Goals: Good    Frequency     Barriers to discharge        Co-evaluation               AM-PAC PT "6 Clicks" Daily Activity  Outcome Measure Difficulty turning over in bed (including adjusting bedclothes, sheets and blankets)?: None Difficulty moving from lying on back to sitting on the side of the bed? : None Difficulty sitting down on and standing up from a chair with arms (e.g., wheelchair, bedside commode, etc,.)?: None Help needed moving to and from a bed to chair (including a wheelchair)?: None Help  needed walking in hospital room?: None Help needed climbing 3-5 steps with a railing? : None 6 Click Score: 24    End of Session Equipment Utilized During Treatment: Gait belt Activity Tolerance: Patient tolerated treatment well Patient left: in chair;with chair alarm set Nurse Communication: Mobility status PT Visit Diagnosis: Muscle weakness (generalized) (M62.81)    Time: 1102-1117 PT Time Calculation (min) (ACUTE ONLY): 15 min   Charges:   PT Evaluation $PT Eval Low Complexity: 1 Low     PT G CodesGreggory Stallion, PT, DPT [phone]   Deserea Bordley 10/30/2017, 1:04 PM

## 2017-11-03 LAB — CULTURE, BLOOD (ROUTINE X 2)
CULTURE: NO GROWTH
Culture: NO GROWTH
SPECIAL REQUESTS: ADEQUATE

## 2017-11-12 ENCOUNTER — Encounter: Payer: Self-pay | Admitting: Oncology

## 2017-11-12 ENCOUNTER — Inpatient Hospital Stay: Payer: Medicare Other | Attending: Oncology | Admitting: Oncology

## 2017-11-12 VITALS — BP 104/58 | HR 89 | Temp 97.5°F | Resp 18 | Ht 67.0 in | Wt 111.3 lb

## 2017-11-12 DIAGNOSIS — Z9012 Acquired absence of left breast and nipple: Secondary | ICD-10-CM

## 2017-11-12 DIAGNOSIS — Z08 Encounter for follow-up examination after completed treatment for malignant neoplasm: Secondary | ICD-10-CM

## 2017-11-12 DIAGNOSIS — J449 Chronic obstructive pulmonary disease, unspecified: Secondary | ICD-10-CM | POA: Diagnosis not present

## 2017-11-12 DIAGNOSIS — C3411 Malignant neoplasm of upper lobe, right bronchus or lung: Secondary | ICD-10-CM | POA: Diagnosis not present

## 2017-11-12 DIAGNOSIS — F17210 Nicotine dependence, cigarettes, uncomplicated: Secondary | ICD-10-CM | POA: Diagnosis not present

## 2017-11-12 DIAGNOSIS — I70203 Unspecified atherosclerosis of native arteries of extremities, bilateral legs: Secondary | ICD-10-CM | POA: Insufficient documentation

## 2017-11-12 DIAGNOSIS — Z85118 Personal history of other malignant neoplasm of bronchus and lung: Secondary | ICD-10-CM

## 2017-11-12 NOTE — Progress Notes (Signed)
No new changes noted today 

## 2017-11-12 NOTE — Progress Notes (Signed)
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 Scans after 4 cycles showed good response to treatment. She completed PCI. She remains in remission and under surveillance. She does have a history of breast cancer in 2002 status post left mastectomy followed by adjuvant chemotherapy. This was treated by Dr. Oliva Bustard. She did not receive any adjuvant radiation.    Interval history- patient was recently hospitalized and treated for HCAP. She was complaining of some headaches and dizziness which has improved. She gets mild headaches on and off. CT head was unremarkable. She continues to smoke. Drinks about 2 ensures a day. Says her appetite is good. But she has lost 10 pounds in the last 2 months. She is now off antibiotics.   ECOG PS- 1 Pain scale- 0   Review of systems- Review of Systems  Constitutional: Positive for malaise/fatigue. Negative for chills, fever and weight loss.  HENT: Negative for congestion, ear discharge and nosebleeds.   Eyes: Negative for blurred vision.  Respiratory: Negative for cough, hemoptysis, sputum production, shortness of breath and wheezing.   Cardiovascular: Negative for chest pain, palpitations, orthopnea and claudication.  Gastrointestinal: Negative for abdominal pain, blood in stool, constipation, diarrhea, heartburn, melena,  nausea and vomiting.  Genitourinary: Negative for dysuria, flank pain, frequency, hematuria and urgency.  Musculoskeletal: Negative for back pain, joint pain and myalgias.  Skin: Negative for rash.  Neurological: Positive for headaches. Negative for dizziness, tingling, focal weakness, seizures and weakness.  Endo/Heme/Allergies: Does not bruise/bleed easily.  Psychiatric/Behavioral: Negative for depression and suicidal ideas. The patient does not have insomnia.       No Known Allergies   Past Medical History:  Diagnosis Date  . AAA (abdominal aortic aneurysm) ([place])   . Breast cancer ([place]) 2002   left  . COPD (chronic obstructive pulmonary disease) ([place])   . High cholesterol   . Personal history of chemotherapy   . Small cell lung cancer ([place])   . Tobacco abuse      Past Surgical History:  Procedure Laterality Date  . ABDOMINAL HYSTERECTOMY    . ENDOBRONCHIAL ULTRASOUND N/A 02/19/2017   Procedure: ENDOBRONCHIAL ULTRASOUND;  Surgeon: Flora Lipps, MD;  Location: [facility] ORS;  Service: Cardiopulmonary;  Laterality: N/A;  . ENDOVASCULAR REPAIR/STENT GRAFT N/A 12/06/2016   Procedure: Endovascular Repair/Stent Graft;  Surgeon: Katha Cabal, MD;  Location: [facility];  Service: Cardiovascular;  Laterality: N/A;  . IR FLUORO GUIDE PORT INSERTION RIGHT  03/02/2017  . MASTECTOMY Left 2002   with sentinel node     Social History   Socioeconomic History  . Marital status: Widowed    Spouse name: Not on file  . Number of children: Not on file  . Years of education: Not on file  . Highest education level: Not on file  Occupational History  . Not on file  Social Needs  . Financial resource strain: Not on file  .  Food insecurity:    Worry: Not on file    Inability: Not on file  . Transportation needs:    Medical: Not on file    Non-medical: Not on file  Tobacco Use  . Smoking status: Current Every Day Smoker    Packs/day: 0.50    Types: Cigarettes  . Smokeless  tobacco: Never Used  Substance and Sexual Activity  . Alcohol use: No  . Drug use: No  . Sexual activity: Never  Lifestyle  . Physical activity:    Days per week: Not on file    Minutes per session: Not on file  . Stress: Not on file  Relationships  . Social connections:    Talks on phone: Not on file    Gets together: Not on file    Attends religious service: Not on file    Active member of club or organization: Not on file    Attends meetings of clubs or organizations: Not on file    Relationship status: Not on file  . Intimate partner violence:    Fear of current or ex partner: Not on file    Emotionally abused: Not on file    Physically abused: Not on file    Forced sexual activity: Not on file  Other Topics Concern  . Not on file  Social History Narrative  . Not on file    Family History  Problem Relation Age of Onset  . CAD Mother   . Colon cancer Brother   . Breast cancer Neg Hx      Current Outpatient Medications:  .  acetaminophen (TYLENOL) 325 MG tablet, Take 650 mg by mouth every 6 (six) hours as needed for mild pain. , Disp: , Rfl:  .  albuterol (PROVENTIL HFA;VENTOLIN HFA) 108 (90 Base) MCG/ACT inhaler, Inhale 2 puffs into the lungs every 6 (six) hours as needed for wheezing or shortness of breath., Disp: 1 Inhaler, Rfl: 1 .  aspirin EC 81 MG tablet, Take 81 mg by mouth daily., Disp: , Rfl:  .  atorvastatin (LIPITOR) 10 MG tablet, Take 10 mg by mouth at bedtime. , Disp: , Rfl:  .  benzonatate (TESSALON) 200 MG capsule, Take 1 capsule (200 mg total) by mouth 3 (three) times daily as needed for cough., Disp: 30 capsule, Rfl: 0 .  chlorpheniramine-HYDROcodone (TUSSIONEX) 10-8 MG/5ML SUER, Take 5 mLs by mouth every 12 (twelve) hours., Disp: 115 mL, Rfl: 0 .  fluticasone-salmeterol (ADVAIR HFA) 115-21 MCG/ACT inhaler, Inhale 2 puffs into the lungs 2 (two) times daily., Disp: , Rfl:  .  guaiFENesin (MUCINEX) 600 MG 12 hr tablet, Take 1 tablet (600 mg total) by  mouth 2 (two) times daily., Disp: 30 tablet, Rfl: 0 .  Multiple Vitamin (MULTIVITAMIN) tablet, Take 1 tablet by mouth daily., Disp: , Rfl:  .  nystatin (MYCOSTATIN) 100000 UNIT/ML suspension, Use as directed 5 mLs (500,000 Units total) in the mouth or throat 4 (four) times daily., Disp: 60 mL, Rfl: 0 .  predniSONE (STERAPRED UNI-PAK 21 TAB) 10 MG (21) TBPK tablet, 6 tabs PO x 1 day 5 tabs PO x 1 day 4 tabs PO x 1 day 3 tabs PO x 1 day 2 tabs PO x 1 day 1 tab PO x 1 day and stop, Disp: 21 tablet, Rfl: 0 No current facility-administered medications for this visit.   Facility-Administered Medications Ordered in Other Visits:  .  sodium chloride flush (NS) 0.9 % injection 10 mL, 10 mL, Intravenous, PRN, Randa Evens  C, MD, 10 mL at 03/26/17 0948 .  sodium chloride flush (NS) 0.9 % injection 10 mL, 10 mL, Intravenous, PRN, Sindy Guadeloupe, MD, 10 mL at 04/16/17 1006  Physical exam:  Vitals:   11/12/17 1410  BP: (!) 104/58  Pulse: 89  Resp: 18  Temp: (!) 97.5 F (36.4 C)  TempSrc: Tympanic  SpO2: 98%  Weight: 111 lb 5.3 oz (50.5 kg)  Height: 5\' 7"  (1.702 m)   Physical Exam  Constitutional: She is oriented to person, place, and time.  Thin elderly female in no acute distress  HENT:  Head: Normocephalic and atraumatic.  Mouth/Throat: Oropharynx is clear and moist.  Poor dentition  Eyes: Pupils are equal, round, and reactive to light. EOM are normal.  Neck: Normal range of motion.  Cardiovascular: Normal rate, regular rhythm and normal heart sounds.  Pulmonary/Chest: Effort normal and breath sounds normal.  Abdominal: Soft. Bowel sounds are normal.  Neurological: She is alert and oriented to person, place, and time.  Skin: Skin is warm and dry.     CMP Latest Ref Rng & Units 10/30/2017  Glucose 65 - 99 mg/dL 94  BUN 6 - 20 mg/dL 7  Creatinine 0.44 - 1.00 mg/dL 0.66  Sodium 135 - 145 mmol/L 137  Potassium 3.5 - 5.1 mmol/L 3.5  Chloride 101 - 111 mmol/L 104  CO2 22 - 32 mmol/L 25    Calcium 8.9 - 10.3 mg/dL 8.1(L)  Total Protein 6.5 - 8.1 g/dL -  Total Bilirubin 0.3 - 1.2 mg/dL -  Alkaline Phos 38 - 126 U/L -  AST 15 - 41 U/L -  ALT 14 - 54 U/L -   CBC Latest Ref Rng & Units 10/30/2017  WBC 3.6 - 11.0 K/uL 5.2  Hemoglobin 12.0 - 16.0 g/dL 11.4(L)  Hematocrit 35.0 - 47.0 % 33.5(L)  Platelets 150 - 440 K/uL 336    No images are attached to the encounter.  Dg Chest 2 View  Result Date: 10/29/2017 CLINICAL DATA:  Acute onset of dizziness and headache. Subacute onset of cough. EXAM: CHEST - 2 VIEW COMPARISON:  CT of the chest performed 08/29/2017 FINDINGS: The lungs are well-aerated. Right midlung airspace opacities are similar in appearance and may reflect chronic atypical infection. New left basilar airspace opacity is also seen. No definite pleural effusion or pneumothorax is seen. The heart is normal in size; the mediastinal contour is within normal limits. A left-sided chest port is noted ending about the distal SVC. No acute osseous abnormalities are seen. An abdominal aortic stent is partially characterized. IMPRESSION: Right midlung airspace opacities are similar in appearance and may reflect chronic atypical infection. New left basilar airspace opacity also seen. Would correlate with the patient's symptoms, and consider follow-up chest radiograph after completion of treatment for pneumonia, to ensure resolution of airspace opacities. Electronically Signed   By: Garald Balding M.D.   On: 10/29/2017 04:27   Ct Head Wo Contrast  Result Date: 10/29/2017 CLINICAL DATA:  Dizziness and headache for 1 week. Cough for 2 weeks. RIGHT lower jaw swelling tonight. History of lung cancer, hypercholesterolemia. EXAM: CT HEAD AND MAXILLOFACIAL WITHOUT AND WITH CONTRAST TECHNIQUE: Contiguous axial images were obtained from the base of the skull through the vertex with and without contrast. Multidetector CT imaging of the face was performed using the standard protocol with and without  intravenous contrast. CONTRAST:  54mL ISOVUE-300 IOPAMIDOL (ISOVUE-300) INJECTION 61% COMPARISON:  MRI of the head September 12, 2017 FINDINGS: CT HEAD FINDINGS BRAIN:  No intraparenchymal hemorrhage, mass effect nor midline shift. The ventricles and sulci are normal for age. Patchy supratentorial white matter hypodensities less than expected for patient's age, though non-specific are most compatible with chronic small vessel ischemic disease. Small area LEFT parietal encephalomalacia. No acute large vascular territory infarcts. No abnormal extra-axial fluid collections. Basal cisterns are patent. VASCULAR: Moderate calcific atherosclerosis of the carotid siphons. SKULL: No skull fracture. No significant scalp soft tissue swelling. OTHER: None. CT MAXILLOFACIAL FINDINGS OSSEOUS: The mandible is intact, the condyles are located. No acute facial fracture. No destructive bony lesions. Severe C5-6 and C6-7 spondylosis. Absent maxillary teeth. Poor mandible dentition. ORBITS: Ocular globes and orbital contents are nonsuspicious. Status post bilateral ocular lens implants. SINUSES: Moderate LEFT maxillary sinus mucosal thickening with air-fluid level. Mild LEFT ethmoid sinusitis. LEFT mastoid effusion. SOFT TISSUES: RIGHT perioral soft tissue swelling without subcutaneous gas, radiopaque foreign bodies or focal fluid collections. Moderate calcific atherosclerosis carotid bifurcation. IMPRESSION: CT HEAD: 1. No acute intracranial process. 2. Small LEFT parietal/MCA territory infarcts. 3. Otherwise negative noncontrast CT HEAD for age. CT MAXILLOFACIAL: 1. RIGHT perioral cellulitis without drainable fluid collection. Findings may be odontogenic. Electronically Signed   By: Elon Alas M.D.   On: 10/29/2017 04:51   Ct Maxillofacial W Contrast  Result Date: 10/29/2017 CLINICAL DATA:  Dizziness and headache for 1 week. Cough for 2 weeks. RIGHT lower jaw swelling tonight. History of lung cancer, hypercholesterolemia. EXAM:  CT HEAD AND MAXILLOFACIAL WITHOUT AND WITH CONTRAST TECHNIQUE: Contiguous axial images were obtained from the base of the skull through the vertex with and without contrast. Multidetector CT imaging of the face was performed using the standard protocol with and without intravenous contrast. CONTRAST:  77mL ISOVUE-300 IOPAMIDOL (ISOVUE-300) INJECTION 61% COMPARISON:  MRI of the head September 12, 2017 FINDINGS: CT HEAD FINDINGS BRAIN: No intraparenchymal hemorrhage, mass effect nor midline shift. The ventricles and sulci are normal for age. Patchy supratentorial white matter hypodensities less than expected for patient's age, though non-specific are most compatible with chronic small vessel ischemic disease. Small area LEFT parietal encephalomalacia. No acute large vascular territory infarcts. No abnormal extra-axial fluid collections. Basal cisterns are patent. VASCULAR: Moderate calcific atherosclerosis of the carotid siphons. SKULL: No skull fracture. No significant scalp soft tissue swelling. OTHER: None. CT MAXILLOFACIAL FINDINGS OSSEOUS: The mandible is intact, the condyles are located. No acute facial fracture. No destructive bony lesions. Severe C5-6 and C6-7 spondylosis. Absent maxillary teeth. Poor mandible dentition. ORBITS: Ocular globes and orbital contents are nonsuspicious. Status post bilateral ocular lens implants. SINUSES: Moderate LEFT maxillary sinus mucosal thickening with air-fluid level. Mild LEFT ethmoid sinusitis. LEFT mastoid effusion. SOFT TISSUES: RIGHT perioral soft tissue swelling without subcutaneous gas, radiopaque foreign bodies or focal fluid collections. Moderate calcific atherosclerosis carotid bifurcation. IMPRESSION: CT HEAD: 1. No acute intracranial process. 2. Small LEFT parietal/MCA territory infarcts. 3. Otherwise negative noncontrast CT HEAD for age. CT MAXILLOFACIAL: 1. RIGHT perioral cellulitis without drainable fluid collection. Findings may be odontogenic. Electronically  Signed   By: Elon Alas M.D.   On: 10/29/2017 04:51     Assessment and plan- Patient is a 72 y.o. female with limited stage small cell lung cancer s/p cisplatin/etoposide and RT in dec 2018 and prophylactic PCI. She is in remission and under surveillance here for post hospital discharge follow up  Patient was recently hospitalized and treated for HCAP. Symptomatically doing better. Dizziness has resolved and headaches are improved but still on and off. She is off antibiotics. She is  due for surveillance brain MRI this month  Limited stage small cell lung cancer- repeat labs cbc/ cmp and ct chest abdomen pelvis due early July. I will see her following that     Visit Diagnosis 1. Small cell lung cancer, right upper lobe ([place])   2. Encounter for follow-up surveillance of lung cancer      Dr. Randa Evens, MD, MPH [facility] [phone] 11/12/2017 2:44 PM

## 2017-11-19 ENCOUNTER — Other Ambulatory Visit: Payer: Self-pay

## 2017-11-19 ENCOUNTER — Emergency Department: Payer: Medicare Other

## 2017-11-19 ENCOUNTER — Ambulatory Visit
Admission: EM | Admit: 2017-11-19 | Discharge: 2017-11-19 | Disposition: A | Discharge status: Other Acute Inpatient Hospital | Payer: Medicare Other | Source: Home / Self Care | Attending: Emergency Medicine | Admitting: Emergency Medicine

## 2017-11-19 ENCOUNTER — Encounter: Payer: Self-pay | Admitting: Emergency Medicine

## 2017-11-19 ENCOUNTER — Emergency Department
Admission: EM | Admit: 2017-11-19 | Discharge: 2017-11-19 | Disposition: A | Discharge status: Home / Self Care | Payer: Medicare Other | Attending: Emergency Medicine | Admitting: Emergency Medicine

## 2017-11-19 DIAGNOSIS — J320 Chronic maxillary sinusitis: Secondary | ICD-10-CM

## 2017-11-19 DIAGNOSIS — J0100 Acute maxillary sinusitis, unspecified: Secondary | ICD-10-CM | POA: Insufficient documentation

## 2017-11-19 DIAGNOSIS — Z853 Personal history of malignant neoplasm of breast: Secondary | ICD-10-CM | POA: Insufficient documentation

## 2017-11-19 DIAGNOSIS — J449 Chronic obstructive pulmonary disease, unspecified: Secondary | ICD-10-CM | POA: Diagnosis not present

## 2017-11-19 DIAGNOSIS — R51 Headache: Secondary | ICD-10-CM

## 2017-11-19 DIAGNOSIS — Z7982 Long term (current) use of aspirin: Secondary | ICD-10-CM | POA: Insufficient documentation

## 2017-11-19 DIAGNOSIS — F17210 Nicotine dependence, cigarettes, uncomplicated: Secondary | ICD-10-CM | POA: Insufficient documentation

## 2017-11-19 DIAGNOSIS — R200 Anesthesia of skin: Secondary | ICD-10-CM | POA: Diagnosis not present

## 2017-11-19 DIAGNOSIS — Z79899 Other long term (current) drug therapy: Secondary | ICD-10-CM | POA: Insufficient documentation

## 2017-11-19 DIAGNOSIS — J3489 Other specified disorders of nose and nasal sinuses: Secondary | ICD-10-CM | POA: Diagnosis present

## 2017-11-19 DIAGNOSIS — R519 Headache, unspecified: Secondary | ICD-10-CM

## 2017-11-19 DIAGNOSIS — Z85118 Personal history of other malignant neoplasm of bronchus and lung: Secondary | ICD-10-CM | POA: Insufficient documentation

## 2017-11-19 DIAGNOSIS — R202 Paresthesia of skin: Secondary | ICD-10-CM | POA: Insufficient documentation

## 2017-11-19 LAB — BASIC METABOLIC PANEL
ANION GAP: 11 (ref 5–15)
BUN: 13 mg/dL (ref 6–20)
CO2: 25 mmol/L (ref 22–32)
Calcium: 8.8 mg/dL — ABNORMAL LOW (ref 8.9–10.3)
Chloride: 98 mmol/L — ABNORMAL LOW (ref 101–111)
Creatinine, Ser: 0.7 mg/dL (ref 0.44–1.00)
GFR calc Af Amer: >60 mL/min (ref >60)
GFR calc non Af Amer: >60 mL/min (ref >60)
GLUCOSE: 126 mg/dL — AB (ref 65–99)
POTASSIUM: 3.7 mmol/L (ref 3.5–5.1)
Sodium: 134 mmol/L — ABNORMAL LOW (ref 135–145)

## 2017-11-19 LAB — CBC
HEMATOCRIT: 38.9 % (ref 35.0–47.0)
Hemoglobin: 13 g/dL (ref 12.0–16.0)
MCH: 30.2 pg (ref 26.0–34.0)
MCHC: 33.4 g/dL (ref 32.0–36.0)
MCV: 90.5 fL (ref 80.0–100.0)
Platelets: 391 10*3/uL (ref 150–440)
RBC: 4.3 MIL/uL (ref 3.80–5.20)
RDW: 17 % — ABNORMAL HIGH (ref 11.5–14.5)
WBC: 9.5 10*3/uL (ref 3.6–11.0)

## 2017-11-19 MED ORDER — AMOXICILLIN-POT CLAVULANATE 875-125 MG PO TABS: 1.0000 | ORAL_TABLET | Freq: Two times a day (BID) | ORAL | 0 refills | Status: AC

## 2017-11-19 MED ORDER — TRAMADOL HCL 50 MG PO TABS: 50.0000 mg | ORAL_TABLET | Freq: Four times a day (QID) | ORAL | 0 refills | Status: DC | PRN

## 2017-11-19 NOTE — Discharge Instructions (Addendum)
Return to the emergency room for any new or worsening symptoms including increased pain, fever, stiff neck, weakness, worsening numbness or any other concerns.  Do not fail to follow-up with and primary care doctor, take the antibiotics until they are gone, do not drink or drive while taking tramadol,

## 2017-11-19 NOTE — ED Triage Notes (Signed)
Pt to ED from home with son c/o right sided facial numbness and headache.  States headache 2 weeks ago and facial numbness since Saturday.  States takes baby ASA daily, denies stroke hx.  Facial movement symmetrical, equal strong strength in arms and legs, talking in complete and coherent sentences, sensation intact.

## 2017-11-19 NOTE — ED Provider Notes (Signed)
[facility]    CSN: [id number] Arrival date & time: 11/19/17  0940     History   Chief Complaint Chief Complaint  Patient presents with  . Headache    HPI Roberta [name] is a 72 y.o. female.   72 year old female presents with right sided pain and temporal area pain/headache for over 2 weeks. Originally started with headache, dizziness and right sided facial pain over 3 weeks ago. Went to [facility] ER and had CT scan of head, face and chest. Was dx with right perioral cellulitis, dehydration and possible pneumonia. Was placed on Ceftin and Prednisone taper and symptoms were improving. Last week, went to her PCP at [facility] with increase in headache (pain never resolved) and facial symptoms. Uncertain from notes if placed back on Steroid taper and patient having difficulty remembering when she was on Prednisone. Brought in by her son today with concern over worsening of pain and symptoms. Denies any fever, nasal congestion, or dizziness. Has history of stage 3B small cell lung cancer. Last chemo tx in Dec 2018 and last radiation tx in Feb 2019. Still smoking and is currently on Advair and Albuterol.   The history is provided by the patient.    Past Medical History:  Diagnosis Date  . AAA (abdominal aortic aneurysm) ([place])   . Breast cancer ([place]) 2002   left  . COPD (chronic obstructive pulmonary disease) ([place])   . High cholesterol   . Personal history of chemotherapy   . Small cell lung cancer ([place])   . Tobacco abuse     Patient Active Problem List   Diagnosis Date Noted  . Protein-calorie malnutrition, severe 10/29/2017  . Sepsis ([place]) 04/28/2017  . Small cell lung cancer, right upper lobe ([place]) 02/23/2017  . Goals of care, counseling/discussion 02/23/2017  . Lung mass   . AAA (abdominal aortic aneurysm) without rupture ([place]) 11/02/2016  . 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 Hyperlipidemia 11/02/2016  . PAD (peripheral artery disease) ([place]) 11/02/2016     Past Surgical History:  Procedure Laterality Date  . ABDOMINAL HYSTERECTOMY    . ENDOBRONCHIAL ULTRASOUND N/A 02/19/2017   Procedure: ENDOBRONCHIAL ULTRASOUND;  Surgeon: Flora Lipps, MD;  Location: [facility] ORS;  Service: Cardiopulmonary;  Laterality: N/A;  . ENDOVASCULAR REPAIR/STENT GRAFT N/A 12/06/2016   Procedure: Endovascular Repair/Stent Graft;  Surgeon: Katha Cabal, MD;  Location: [place] [facility];  Service: Cardiovascular;  Laterality: N/A;  . IR FLUORO GUIDE PORT INSERTION RIGHT  03/02/2017  . MASTECTOMY Left 2002   with sentinel node     OB History   None      Home Medications    Prior to Admission medications   Medication Sig Start Date End Date Taking? Authorizing Provider  acetaminophen (TYLENOL) 325 MG tablet Take 650 mg by mouth every 6 (six) hours as needed for mild pain.    Yes [provider]  albuterol (PROVENTIL HFA;VENTOLIN HFA) 108 (90 Base) MCG/ACT inhaler Inhale 2 puffs into the lungs every 6 (six) hours as needed for wheezing or shortness of breath. 03/23/16  Yes Frederich Cha, MD  aspirin EC 81 MG tablet Take 81 mg by mouth daily.   Yes [provider]  atorvastatin (LIPITOR) 10 MG tablet Take 10 mg by mouth at bedtime.  08/11/16  Yes [provider]  fluticasone-salmeterol (ADVAIR HFA) 115-21 MCG/ACT inhaler Inhale 2 puffs into the lungs 2 (two) times daily.   Yes [provider]  Multiple Vitamin (  MULTIVITAMIN) tablet Take 1 tablet by mouth daily.   Yes [provider]  nystatin (MYCOSTATIN) 100000 UNIT/ML suspension Use as directed 5 mLs (500,000 Units total) in the mouth or throat 4 (four) times daily. 10/30/17  Yes Gladstone Lighter, MD  predniSONE (STERAPRED UNI-PAK 21 TAB) 10 MG (21) TBPK tablet 6 tabs PO x 1 day 5 tabs PO x 1 day 4 tabs PO x 1 day 3 tabs PO x 1 day 2 tabs PO x 1 day 1 tab PO x 1 day and stop Patient taking differently: Take 40 mg by mouth daily. X10DAYS 10/30/17  Yes Gladstone Lighter, MD  amoxicillin-clavulanate (AUGMENTIN) 875-125 MG tablet Take 1 tablet by mouth 2 (two) times daily for 7 days. 11/19/17 11/26/17  Schuyler Amor, MD  traMADol (ULTRAM) 50 MG tablet Take 1 tablet (50 mg total) by mouth every 6 (six) hours as needed. 11/19/17 11/19/18  Schuyler Amor, MD    Family History Family History  Problem Relation Age of Onset  . CAD Mother   . Colon cancer Brother   . Breast cancer Neg Hx     Social History Social History   Tobacco Use  . Smoking status: Current Every Day Smoker    Packs/day: 0.50    Types: Cigarettes  . Smokeless tobacco: Never Used  Substance Use Topics  . Alcohol use: No  . Drug use: No     Allergies   Patient has no known allergies.   Review of Systems Review of Systems  Constitutional: Positive for activity change, appetite change, fatigue and unexpected weight change. Negative for chills and fever.  HENT: Positive for dental problem (poor dentition) and sinus pain (right upper facial pain). Negative for congestion, ear discharge, ear pain, facial swelling, nosebleeds, postnasal drip, rhinorrhea, sinus pressure, sore throat and trouble swallowing.   Respiratory: Positive for cough, shortness of breath and wheezing. Negative for chest tightness.   Cardiovascular: Negative for chest pain and palpitations.  Gastrointestinal: Negative for nausea and vomiting.  Musculoskeletal: Positive for arthralgias.  Skin: Negative for rash and wound.  Allergic/Immunologic: Positive for immunocompromised state.  Neurological: Positive for weakness, light-headedness, numbness and headaches. Negative for dizziness, tremors, seizures, syncope, facial asymmetry and speech difficulty.  Hematological: Negative for adenopathy. Bruises/bleeds easily.     Physical Exam Triage Vital Signs ED Triage Vitals  Enc Vitals Group     BP 11/19/17 1014 136/77     Pulse Rate 11/19/17 1014 87     Resp 11/19/17 1014 18     Temp 11/19/17 1014 97.8 F  (36.6 C)     Temp Source 11/19/17 1014 Oral     SpO2 11/19/17 1014 96 %     Weight 11/19/17 1013 111 lb (50.3 kg)     Height 11/19/17 1013 5\' 7"  (1.702 m)     Head Circumference --      Peak Flow --      Pain Score 11/19/17 1013 5     Pain Loc --      Pain Edu? --      Excl. in [place]? --    No data found.  Updated Vital Signs BP 136/77 (BP Location: Left Arm)   Pulse 87   Temp 97.8 F (36.6 C) (Oral)   Resp 18   Ht 5\' 7"  (1.702 m)   Wt 111 lb (50.3 kg)   SpO2 96%   BMI 17.39 kg/m   Visual Acuity Right Eye Distance:   Left Eye Distance:  Bilateral Distance:    Right Eye Near:   Left Eye Near:    Bilateral Near:     Physical Exam  Constitutional: She appears well-developed. She appears cachectic. She is cooperative. She appears ill. No distress.  Patient lying down on table holding the right side of her face and appears in pain but in no acute distress.   HENT:  Head: Normocephalic. Head is without contusion, without laceration, without right periorbital erythema and without left periorbital erythema.    Right Ear: Hearing, tympanic membrane and external ear normal.  Left Ear: Hearing, tympanic membrane and external ear normal.  Nose: Right sinus exhibits maxillary sinus tenderness and frontal sinus tenderness. Left sinus exhibits no maxillary sinus tenderness and no frontal sinus tenderness.  Mouth/Throat: Mucous membranes are dry. Abnormal dentition. Dental caries present.  Face sunken bilaterally. No redness or distinct swelling seen. Very tender along entire right side of her face from lower cheek to periorbital area. Very tender also along temporal area of head and scalp. No distinct swelling. Decreased sensation of lower maxillary area. Otherwise no other neuro deficits noted.  Poor dentition with only a few teeth present. No distinct sores or abscess seen.   Eyes: Pupils are equal, round, and reactive to light. Conjunctivae and EOM are normal.  Neck: Normal range  of motion. Neck supple.  Cardiovascular: Normal rate.  Pulmonary/Chest: Effort normal. No respiratory distress. She has decreased breath sounds in the right upper field, the right lower field, the left upper field and the left lower field. She has wheezes in the right upper field, the right lower field, the left upper field and the left lower field. She has no rhonchi. She has no rales.  Neurological: She is alert. She is disoriented. A sensory deficit is present. No cranial nerve deficit.  Skin: Skin is warm and dry. Capillary refill takes less than 2 seconds. No rash noted.  Psychiatric: She has a normal mood and affect. Her behavior is normal.  Vitals reviewed.    UC Treatments / Results  Labs (all labs ordered are listed, but only abnormal results are displayed) Labs Reviewed - No data to display  EKG None  Radiology Ct Head Wo Contrast  Result Date: 11/19/2017 CLINICAL DATA:  Acute headache with normal neuro exam right-sided facial numbness and headache EXAM: CT HEAD WITHOUT CONTRAST TECHNIQUE: Contiguous axial images were obtained from the base of the skull through the vertex without intravenous contrast. COMPARISON:  10/29/2017 FINDINGS: Brain: No evidence of acute infarction, hemorrhage, hydrocephalus, extra-axial collection or mass lesion/mass effect. 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 Vascular: Atherosclerotic calcification Skull: Negative Sinuses/Orbits: Under pneumatized left mastoid with opacification, stable. Partial right mastoid opacification that is progressed. Negative nasopharynx. IMPRESSION: 1. No acute intracranial finding. 2. Partial right mastoid opacification that is progressed from 10/29/2017. 3. Chronic left mastoid opacification.  The nasopharynx is clear. Electronically Signed   By: Monte Fantasia M.D.   On: 11/19/2017 13:07    Procedures Procedures (including critical care time)  Medications Ordered in UC Medications - No data to  display  Initial Impression / Assessment and Plan / UC Course  I have reviewed the triage vital signs and the nursing notes.  Pertinent labs & imaging results that were available during my care of the patient were reviewed by me and considered in my medical decision making (see chart for details).    Discussed with patient and son- concerned over continued intensity of headache and facial tenderness along with  numbness. May need to repeat CT scan of head/face and/or perform MRI. Since patient is in significant pain, recommend evaluation at [facility] ER. Her vitals are stable and in no significant distress so she can travel by car to the ER. Patient and son agree with plan and son will drive her to the ER now. Notified charge nurse at [facility] ER of patient's presenting condition and approximate arrival time. Patient to follow-up at ER as planned.  Final Clinical Impressions(s) / UC Diagnoses   Final diagnoses:  Right-sided headache  Right facial numbness     Discharge Instructions     Due to right sided facial and temporal pain along with right sided numbness that is getting worse despite Prednisone treatment,  along with history of lung cancer, recommend go to the ER now for further evaluation.     ED Prescriptions    None     Controlled Substance Prescriptions [place] Controlled Substance Registry consulted? Not Applicable   Katy Apo, NP 11/19/17 [phone]

## 2017-11-19 NOTE — Discharge Instructions (Addendum)
Due to right sided facial and temporal pain along with right sided numbness that is getting worse despite Prednisone treatment,  along with history of lung cancer, recommend go to the ER now for further evaluation.

## 2017-11-19 NOTE — ED Triage Notes (Signed)
Patient states that she was seen by doctor last week and given steroids for a headache. Patient states that she noticed some numbness on her right face x 1.5 weeks. Patient denies numbness anywhere else. Patient states that headache has not seem to get any better.

## 2017-11-19 NOTE — ED Notes (Signed)

## 2017-11-19 NOTE — ED Provider Notes (Signed)
[facility] Emergency Department Provider Note  ____________________________________________   I have reviewed the triage vital signs and the nursing notes. Where available I have reviewed prior notes and, if possible and indicated, outside hospital notes.    HISTORY  Chief Complaint Numbness    HPI GREYSEN SWANTON is a 72 y.o. female   History of lung cancer small cell, tobacco abuse, high cholesterol COPD, AAA breast cancer among other medical problems presents today complaining of pain and pressure around the right maxillary sinus.  Patient was recently seen and treated for pneumonia with Ceftin, she also had at that time of facial cellulitis which has resolved.  However, she has had facial pressure on the right side since that time.  She denies focal neurologic findings except for she states that it feels a little tingly just around a $0.50 piece sized area on the right cheek.  No other neurologic deficit.  Difficulty talking walking speaking etc.  Headache is been there consistently for 3 weeks and is been getting gradually worse and is a pressure in the right maxillary sinus region.  She does have nasal discharge which is somewhat green.  She denies any fever chills stiff neck or cough.     Past Medical History:  Diagnosis Date  . AAA (abdominal aortic aneurysm) ([place])   . Breast cancer ([place]) 2002   left  . COPD (chronic obstructive pulmonary disease) ([place])   . High cholesterol   . Personal history of chemotherapy   . Small cell lung cancer ([place])   . Tobacco abuse     Patient Active Problem List   Diagnosis Date Noted  . Protein-calorie malnutrition, severe 10/29/2017  . Sepsis ([place]) 04/28/2017  . Small cell lung cancer, right upper lobe ([place]) 02/23/2017  . Goals of care, counseling/discussion 02/23/2017  . Lung mass   . AAA (abdominal aortic aneurysm) without rupture ([place]) 11/02/2016  . 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 Hyperlipidemia  11/02/2016  . PAD (peripheral artery disease) ([place]) 11/02/2016    Past Surgical History:  Procedure Laterality Date  . ABDOMINAL HYSTERECTOMY    . ENDOBRONCHIAL ULTRASOUND N/A 02/19/2017   Procedure: ENDOBRONCHIAL ULTRASOUND;  Surgeon: Flora Lipps, MD;  Location: [facility] ORS;  Service: Cardiopulmonary;  Laterality: N/A;  . ENDOVASCULAR REPAIR/STENT GRAFT N/A 12/06/2016   Procedure: Endovascular Repair/Stent Graft;  Surgeon: Katha Cabal, MD;  Location: [place] [facility];  Service: Cardiovascular;  Laterality: N/A;  . IR FLUORO GUIDE PORT INSERTION RIGHT  03/02/2017  . MASTECTOMY Left 2002   with sentinel node     Prior to Admission medications   Medication Sig Start Date End Date Taking? Authorizing Provider  aspirin EC 81 MG tablet Take 81 mg by mouth daily.   Yes [provider]  atorvastatin (LIPITOR) 10 MG tablet Take 10 mg by mouth at bedtime.  08/11/16  Yes [provider]  Multiple Vitamin (MULTIVITAMIN) tablet Take 1 tablet by mouth daily.   Yes [provider]  nystatin (MYCOSTATIN) 100000 UNIT/ML suspension Use as directed 5 mLs (500,000 Units total) in the mouth or throat 4 (four) times daily. 10/30/17  Yes Gladstone Lighter, MD  predniSONE (STERAPRED UNI-PAK 21 TAB) 10 MG (21) TBPK tablet 6 tabs PO x 1 day 5 tabs PO x 1 day 4 tabs PO x 1 day 3 tabs PO x 1 day 2 tabs PO x 1 day 1 tab PO x 1 day and stop Patient taking differently: Take 40 mg by mouth  daily. X10DAYS 10/30/17  Yes Gladstone Lighter, MD  acetaminophen (TYLENOL) 325 MG tablet Take 650 mg by mouth every 6 (six) hours as needed for mild pain.     [provider]  albuterol (PROVENTIL HFA;VENTOLIN HFA) 108 (90 Base) MCG/ACT inhaler Inhale 2 puffs into the lungs every 6 (six) hours as needed for wheezing or shortness of breath. 03/23/16   Frederich Cha, MD  fluticasone-salmeterol (ADVAIR HFA) [phone] MCG/ACT inhaler Inhale 2 puffs into the lungs 2 (two) times daily.    [provider]    Allergies Patient has no known allergies.  Family History  Problem Relation Age of Onset  . CAD Mother   . Colon cancer Brother   . Breast cancer Neg Hx     Social History Social History   Tobacco Use  . Smoking status: Current Every Day Smoker    Packs/day: 0.50    Types: Cigarettes  . Smokeless tobacco: Never Used  Substance Use Topics  . Alcohol use: No  . Drug use: No    Review of Systems Constitutional: No fever/chills Eyes: No visual changes. ENT: No sore throat. No stiff neck no neck pain Cardiovascular: Denies chest pain. Respiratory: Denies shortness of breath. Gastrointestinal:   no vomiting.  No diarrhea.  No constipation. Genitourinary: Negative for dysuria. Musculoskeletal: Negative lower extremity swelling Skin: Negative for rash. Neurological: Negative for severe headaches, focal weakness or numbness.   ____________________________________________   PHYSICAL EXAM:  VITAL SIGNS: ED Triage Vitals  Enc Vitals Group     BP 11/19/17 1219 128/60     Pulse Rate 11/19/17 1219 (!) 112     Resp 11/19/17 1219 18     Temp 11/19/17 1219 97.7 F (36.5 C)     Temp Source 11/19/17 1219 Oral     SpO2 11/19/17 1413 99 %     Weight --      Height --      Head Circumference --      Peak Flow --      Pain Score 11/19/17 1226 5     Pain Loc --      Pain Edu? --      Excl. in [place]? --     Constitutional: Alert and oriented. Well appearing and in no acute distress. Eyes: Conjunctivae are normal Head: Atraumatic HEENT: No congestion/rhinnorhea. Mucous membranes are moist.  Oropharynx non-erythematous, very poor dentition but no obvious infection noted, palpation to the right maxillary sinus reproduces the patient's discomfort.  TMs are somewhat limited in the visualization due to a earwax but no evidence of acute infection Neck:   Nontender with no meningismus, no masses, no stridor Cardiovascular: Normal rate, regular rhythm. Grossly normal  heart sounds.  Good peripheral circulation. Respiratory: Normal respiratory effort.  No retractions. Lungs CTAB. Abdominal: Soft and nontender. No distention. No guarding no rebound Back:  There is no focal tenderness or step off.  there is no midline tenderness there are no lesions noted. there is no CVA tenderness Musculoskeletal: No lower extremity tenderness, no upper extremity tenderness. No joint effusions, no DVT signs strong distal pulses no edema Neurologic: Cranial nerves II through XII are grossly intact 5 out of 5 strength bilateral upper and lower extremity. Finger to nose within normal limits heel to shin within normal limits, speech is normal with no word finding difficulty or dysarthria, reflexes symmetric, pupils are equally round and reactive to light, there is no pronator drift, sensation is normal, vision is intact to confrontation, gait  is deferred, there is no nystagmus, normal neurologic exam Skin:  Skin is warm, dry and intact. No rash noted. Psychiatric: Mood and affect are normal. Speech and behavior are normal.  ____________________________________________   LABS (all labs ordered are listed, but only abnormal results are displayed)  Labs Reviewed  CBC - Abnormal; Notable for the following components:      Result Value   RDW 17.0 (*)    All other components within normal limits  BASIC METABOLIC PANEL - Abnormal; Notable for the following components:   Sodium 134 (*)    Chloride 98 (*)    Glucose, Bld 126 (*)    Calcium 8.8 (*)    All other components within normal limits    Pertinent labs  results that were available during my care of the patient were reviewed by me and considered in my medical decision making (see chart for details). ____________________________________________  EKG  I personally interpreted any EKGs ordered by me or triage  ____________________________________________  RADIOLOGY  Pertinent labs & imaging results that were available  during my care of the patient were reviewed by me and considered in my medical decision making (see chart for details). If possible, patient and/or family made aware of any abnormal findings.  Ct Head Wo Contrast  Result Date: 11/19/2017 CLINICAL DATA:  Acute headache with normal neuro exam right-sided facial numbness and headache EXAM: CT HEAD WITHOUT CONTRAST TECHNIQUE: Contiguous axial images were obtained from the base of the skull through the vertex without intravenous contrast. COMPARISON:  10/29/2017 FINDINGS: Brain: No evidence of acute infarction, hemorrhage, hydrocephalus, extra-axial collection or mass lesion/mass effect. 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 Vascular: Atherosclerotic calcification Skull: Negative Sinuses/Orbits: Under pneumatized left mastoid with opacification, stable. Partial right mastoid opacification that is progressed. Negative nasopharynx. IMPRESSION: 1. No acute intracranial finding. 2. Partial right mastoid opacification that is progressed from 10/29/2017. 3. Chronic left mastoid opacification.  The nasopharynx is clear. Electronically Signed   By: Monte Fantasia M.D.   On: 11/19/2017 13:07   ____________________________________________    PROCEDURES  Procedure(s) performed: None  Procedures  Critical Care performed: None  ____________________________________________   INITIAL IMPRESSION / ASSESSMENT AND PLAN / ED COURSE  Pertinent labs & imaging results that were available during my care of the patient were reviewed by me and considered in my medical decision making (see chart for details).  CT scan shows worsening right maxillary sinusitis which is consistent with patient's exam and history.  I discussed with Dr. Ladene Artist .  Patient does have outpatient follow-up then but not until July, they will advance that.  He does agree with Augmentin as treatment for this advancing symptomatic sinusitis patient is not otherwise septic.  They  will see her in a close follow-up.  Extensive return precautions were given understood patient and family very much in agreement with this plan.    ____________________________________________   FINAL CLINICAL IMPRESSION(S) / ED DIAGNOSES  Final diagnoses:  None      This chart was dictated using voice recognition software.  Despite best efforts to proofread,  errors can occur which can change meaning.      Schuyler Amor, MD 11/19/17 [phone]

## 2017-11-22 ENCOUNTER — Inpatient Hospital Stay: Payer: Medicare Other

## 2017-11-22 ENCOUNTER — Encounter: Payer: Self-pay | Admitting: Internal Medicine

## 2017-11-22 ENCOUNTER — Telehealth: Payer: Self-pay

## 2017-11-22 ENCOUNTER — Other Ambulatory Visit: Payer: Self-pay

## 2017-11-22 ENCOUNTER — Ambulatory Visit
Admission: RE | Admit: 2017-11-22 | Discharge: 2017-11-22 | Disposition: A | Discharge status: Home / Self Care | Payer: Medicare Other | Source: Ambulatory Visit | Attending: Oncology | Admitting: Oncology

## 2017-11-22 DIAGNOSIS — C3411 Malignant neoplasm of upper lobe, right bronchus or lung: Secondary | ICD-10-CM

## 2017-11-22 DIAGNOSIS — I6782 Cerebral ischemia: Secondary | ICD-10-CM | POA: Insufficient documentation

## 2017-11-22 LAB — COMPREHENSIVE METABOLIC PANEL
ALT: 20 U/L (ref 0–44)
ANION GAP: 13 (ref 5–15)
AST: 21 U/L (ref 15–41)
Albumin: 3.3 g/dL — ABNORMAL LOW (ref 3.5–5.0)
Alkaline Phosphatase: 84 U/L (ref 38–126)
BUN: 14 mg/dL (ref 8–23)
CHLORIDE: 92 mmol/L — AB (ref 98–111)
CO2: 26 mmol/L (ref 22–32)
Calcium: 8.6 mg/dL — ABNORMAL LOW (ref 8.9–10.3)
Creatinine, Ser: 0.76 mg/dL (ref 0.44–1.00)
GFR calc Af Amer: >60 mL/min (ref >60)
GFR calc non Af Amer: >60 mL/min (ref >60)
Glucose, Bld: 108 mg/dL — ABNORMAL HIGH (ref 70–99)
POTASSIUM: 3.3 mmol/L — AB (ref 3.5–5.1)
Sodium: 131 mmol/L — ABNORMAL LOW (ref 135–145)
Total Bilirubin: 0.4 mg/dL (ref 0.3–1.2)
Total Protein: 6.5 g/dL (ref 6.5–8.1)

## 2017-11-22 LAB — CBC WITH DIFFERENTIAL/PLATELET
BASOS ABS: 0 10*3/uL (ref 0–0.1)
Basophils Relative: 0 %
EOS PCT: 0 %
Eosinophils Absolute: 0 10*3/uL (ref 0–0.7)
HEMATOCRIT: 36.7 % (ref 35.0–47.0)
HEMOGLOBIN: 12.2 g/dL (ref 12.0–16.0)
LYMPHS ABS: 0.5 10*3/uL — AB (ref 1.0–3.6)
LYMPHS PCT: 4 %
MCH: 29.4 pg (ref 26.0–34.0)
MCHC: 33.4 g/dL (ref 32.0–36.0)
MCV: 88 fL (ref 80.0–100.0)
Monocytes Absolute: 0.3 10*3/uL (ref 0.2–0.9)
Monocytes Relative: 3 %
NEUTROS ABS: 11.9 10*3/uL — AB (ref 1.4–6.5)
NEUTROS PCT: 93 %
Platelets: 347 10*3/uL (ref 150–440)
RBC: 4.17 MIL/uL (ref 3.80–5.20)
RDW: 16.8 % — AB (ref 11.5–14.5)
WBC: 12.8 10*3/uL — ABNORMAL HIGH (ref 3.6–11.0)

## 2017-11-22 MED ORDER — HEPARIN SOD (PORK) LOCK FLUSH 100 UNIT/ML IV SOLN
500.0000 [IU] | Freq: Once | INTRAVENOUS | Status: AC
  Administered 2017-11-22: 500 [IU] via INTRAVENOUS
  Filled 2017-11-22: qty 5

## 2017-11-22 MED ORDER — SODIUM CHLORIDE 0.9% FLUSH
10.0000 mL | INTRAVENOUS | Status: DC | PRN
  Administered 2017-11-22: 10 mL via INTRAVENOUS
  Filled 2017-11-22: qty 10

## 2017-11-22 MED ORDER — POTASSIUM CHLORIDE CRYS ER 20 MEQ PO TBCR: 20.0000 meq | EXTENDED_RELEASE_TABLET | Freq: Every day | ORAL | 0 refills | Status: DC

## 2017-11-22 MED ORDER — GADOBENATE DIMEGLUMINE 529 MG/ML IV SOLN
10.0000 mL | Freq: Once | INTRAVENOUS | Status: AC | PRN
  Administered 2017-11-22: 10 mL via INTRAVENOUS

## 2017-11-22 NOTE — Telephone Encounter (Signed)
Spoke with the patient to inform her per Dr Janese Banks her potassium levels are still low and will need to start taken potassium 20 meq daily x 7 days and will have labs repeat on 7/8 with MD visit / has been e-scribe into the Pharmacy / [facility] in [place]. The patient was agreeable and understanding.

## 2017-11-22 NOTE — Telephone Encounter (Signed)
-----   Message from Sindy Guadel[place], MD sent at 11/22/2017  9:40 AM EDT ----- Please let her know potassium is mildly low. We should prescribe 20 meq K daily for a week. Repeat bmp on the day she sees me. Thanks, Astrid Divine

## 2017-11-27 ENCOUNTER — Ambulatory Visit
Admission: RE | Admit: 2017-11-27 | Discharge: 2017-11-27 | Disposition: A | Discharge status: Home / Self Care | Payer: Medicare Other | Source: Ambulatory Visit | Attending: Oncology | Admitting: Oncology

## 2017-11-27 ENCOUNTER — Other Ambulatory Visit: Payer: Medicare Other

## 2017-11-27 ENCOUNTER — Ambulatory Visit: Payer: Medicare Other

## 2017-11-27 DIAGNOSIS — I719 Aortic aneurysm of unspecified site, without rupture: Secondary | ICD-10-CM | POA: Insufficient documentation

## 2017-11-27 DIAGNOSIS — J439 Emphysema, unspecified: Secondary | ICD-10-CM | POA: Diagnosis not present

## 2017-11-27 DIAGNOSIS — C3411 Malignant neoplasm of upper lobe, right bronchus or lung: Secondary | ICD-10-CM | POA: Diagnosis present

## 2017-11-27 DIAGNOSIS — I2510 Atherosclerotic heart disease of native coronary artery without angina pectoris: Secondary | ICD-10-CM | POA: Insufficient documentation

## 2017-11-27 DIAGNOSIS — K219 Gastro-esophageal reflux disease without esophagitis: Secondary | ICD-10-CM | POA: Insufficient documentation

## 2017-11-27 DIAGNOSIS — J219 Acute bronchiolitis, unspecified: Secondary | ICD-10-CM | POA: Diagnosis not present

## 2017-11-27 DIAGNOSIS — I700 Atherosclerosis of aorta: Secondary | ICD-10-CM | POA: Diagnosis not present

## 2017-11-27 DIAGNOSIS — K5730 Diverticulosis of large intestine without perforation or abscess without bleeding: Secondary | ICD-10-CM | POA: Diagnosis not present

## 2017-11-27 DIAGNOSIS — K5900 Constipation, unspecified: Secondary | ICD-10-CM | POA: Diagnosis not present

## 2017-11-27 MED ORDER — IOPAMIDOL (ISOVUE-300) INJECTION 61%
100.0000 mL | Freq: Once | INTRAVENOUS | Status: AC | PRN
  Administered 2017-11-27: 85 mL via INTRAVENOUS

## 2017-12-03 ENCOUNTER — Encounter: Payer: Self-pay | Admitting: Oncology

## 2017-12-03 ENCOUNTER — Inpatient Hospital Stay: Payer: Medicare Other

## 2017-12-03 ENCOUNTER — Inpatient Hospital Stay: Payer: Medicare Other | Attending: Oncology | Admitting: Oncology

## 2017-12-03 VITALS — BP 117/71 | HR 92 | Temp 97.7°F | Resp 18 | Ht 67.0 in | Wt 113.0 lb

## 2017-12-03 DIAGNOSIS — C3411 Malignant neoplasm of upper lobe, right bronchus or lung: Secondary | ICD-10-CM

## 2017-12-03 DIAGNOSIS — I70203 Unspecified atherosclerosis of native arteries of extremities, bilateral legs: Secondary | ICD-10-CM

## 2017-12-03 DIAGNOSIS — J449 Chronic obstructive pulmonary disease, unspecified: Secondary | ICD-10-CM | POA: Diagnosis not present

## 2017-12-03 DIAGNOSIS — F17210 Nicotine dependence, cigarettes, uncomplicated: Secondary | ICD-10-CM | POA: Insufficient documentation

## 2017-12-03 DIAGNOSIS — Z08 Encounter for follow-up examination after completed treatment for malignant neoplasm: Secondary | ICD-10-CM

## 2017-12-03 DIAGNOSIS — Z85118 Personal history of other malignant neoplasm of bronchus and lung: Secondary | ICD-10-CM

## 2017-12-03 LAB — CBC WITH DIFFERENTIAL/PLATELET
BASOS PCT: 0 %
Basophils Absolute: 0 10*3/uL (ref 0–0.1)
EOS ABS: 0 10*3/uL (ref 0–0.7)
EOS PCT: 0 %
HCT: 40 % (ref 35.0–47.0)
HEMOGLOBIN: 12.9 g/dL (ref 12.0–16.0)
LYMPHS ABS: 0.5 10*3/uL — AB (ref 1.0–3.6)
Lymphocytes Relative: 2 %
MCH: 28.9 pg (ref 26.0–34.0)
MCHC: 32.1 g/dL (ref 32.0–36.0)
MCV: 89.9 fL (ref 80.0–100.0)
MONO ABS: 0.4 10*3/uL (ref 0.2–0.9)
MONOS PCT: 2 %
NEUTROS PCT: 96 %
Neutro Abs: 19.2 10*3/uL — ABNORMAL HIGH (ref 1.4–6.5)
PLATELETS: 419 10*3/uL (ref 150–440)
RBC: 4.45 MIL/uL (ref 3.80–5.20)
RDW: 16.9 % — AB (ref 11.5–14.5)
WBC: 20.1 10*3/uL — ABNORMAL HIGH (ref 3.6–11.0)

## 2017-12-03 LAB — BASIC METABOLIC PANEL
Anion gap: 13 (ref 5–15)
BUN: 20 mg/dL (ref 8–23)
CALCIUM: 8.9 mg/dL (ref 8.9–10.3)
CHLORIDE: 97 mmol/L — AB (ref 98–111)
CO2: 26 mmol/L (ref 22–32)
CREATININE: 0.81 mg/dL (ref 0.44–1.00)
GFR calc non Af Amer: >60 mL/min (ref >60)
Glucose, Bld: 126 mg/dL — ABNORMAL HIGH (ref 70–99)
Potassium: 4.2 mmol/L (ref 3.5–5.1)
Sodium: 136 mmol/L (ref 135–145)

## 2017-12-03 NOTE — Progress Notes (Signed)
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 Scans after 4 cycles showed good response to treatment. She completed PCI. She remains in remission and under surveillance. She does have a history of breast cancer in 2002 status post left mastectomy followed by adjuvant chemotherapy. This was treated by Dr. Oliva Bustard. She did not receive any adjuvant radiation.    Interval history- patient was seen in the ER for right sided facial pain on 11/19/17. She was treated with augmentin for 1 week. She has seen Dr. Kathyrn Sheriff for the same who is working her up for possible temporal arteritis and she is scheduled to get temporal artery biopsy this week. She is still experiencing on and off headaches. She iss on high dose prednisone 60 mg  ECOG PS- 2 Pain scale- 4 Opioid associated constipation- no  Review of systems- Review of Systems  Constitutional: Positive for malaise/fatigue. Negative for chills, fever and weight loss.  HENT: Negative for congestion, ear discharge and nosebleeds.   Eyes: Negative for blurred vision.  Respiratory: Negative for cough, hemoptysis, sputum production, shortness of breath and wheezing.   Cardiovascular: Negative for chest pain, palpitations, orthopnea and claudication.  Gastrointestinal: Negative for abdominal pain, blood in stool,  constipation, diarrhea, heartburn, melena, nausea and vomiting.  Genitourinary: Negative for dysuria, flank pain, frequency, hematuria and urgency.  Musculoskeletal: Negative for back pain, joint pain and myalgias.  Skin: Negative for rash.  Neurological: Positive for headaches. Negative for dizziness, tingling, focal weakness, seizures and weakness.  Endo/Heme/Allergies: Does not bruise/bleed easily.  Psychiatric/Behavioral: Negative for depression and suicidal ideas. The patient does not have insomnia.      No Known Allergies   Past Medical History:  Diagnosis Date  . AAA (abdominal aortic aneurysm) ([place])   . Breast cancer ([place]) 2002   left  . COPD (chronic obstructive pulmonary disease) ([place])   . High cholesterol   . Personal history of chemotherapy   . Small cell lung cancer ([place])   . Tobacco abuse      Past Surgical History:  Procedure Laterality Date  . ABDOMINAL HYSTERECTOMY    . ENDOBRONCHIAL ULTRASOUND N/A 02/19/2017   Procedure: ENDOBRONCHIAL ULTRASOUND;  Surgeon: Flora Lipps, MD;  Location: [facility] ORS;  Service: Cardiopulmonary;  Laterality: N/A;  . ENDOVASCULAR REPAIR/STENT GRAFT N/A 12/06/2016   Procedure: Endovascular Repair/Stent Graft;  Surgeon: Katha Cabal, MD;  Location: [facility];  Service: Cardiovascular;  Laterality: N/A;  . IR FLUORO GUIDE PORT INSERTION RIGHT  03/02/2017  . MASTECTOMY Left 2002   with sentinel node     Social History   Socioeconomic History  . Marital status: Widowed    Spouse name: Not on file  . Number of children: Not on file  . Years of education: Not on file  . Highest education level: Not on file  Occupational History  . Not on file  Social Needs  . Financial resource strain:  Not on file  . Food insecurity:    Worry: Not on file    Inability: Not on file  . Transportation needs:    Medical: Not on file    Non-medical: Not on file  Tobacco Use  . Smoking status: Current Every Day Smoker    Packs/day:  0.50    Types: Cigarettes  . Smokeless tobacco: Never Used  Substance and Sexual Activity  . Alcohol use: No  . Drug use: No  . Sexual activity: Never  Lifestyle  . Physical activity:    Days per week: Not on file    Minutes per session: Not on file  . Stress: Not on file  Relationships  . Social connections:    Talks on phone: Not on file    Gets together: Not on file    Attends religious service: Not on file    Active member of club or organization: Not on file    Attends meetings of clubs or organizations: Not on file    Relationship status: Not on file  . Intimate partner violence:    Fear of current or ex partner: Not on file    Emotionally abused: Not on file    Physically abused: Not on file    Forced sexual activity: Not on file  Other Topics Concern  . Not on file  Social History Narrative  . Not on file    Family History  Problem Relation Age of Onset  . CAD Mother   . Colon cancer Brother   . Breast cancer Neg Hx      Current Outpatient Medications:  .  atorvastatin (LIPITOR) 10 MG tablet, Take 10 mg by mouth at bedtime. , Disp: , Rfl:  .  fluticasone-salmeterol (ADVAIR HFA) 115-21 MCG/ACT inhaler, Inhale 2 puffs into the lungs 2 (two) times daily., Disp: , Rfl:  .  Multiple Vitamin (MULTIVITAMIN) tablet, Take 1 tablet by mouth daily., Disp: , Rfl:  .  potassium chloride SA (K-DUR,KLOR-CON) 20 MEQ tablet, Take 1 tablet (20 mEq total) by mouth daily., Disp: 7 tablet, Rfl: 0 .  predniSONE (STERAPRED UNI-PAK 21 TAB) 10 MG (21) TBPK tablet, 6 tabs PO x 1 day 5 tabs PO x 1 day 4 tabs PO x 1 day 3 tabs PO x 1 day 2 tabs PO x 1 day 1 tab PO x 1 day and stop (Patient taking differently: Take 40 mg by mouth daily. X10DAYS), Disp: 21 tablet, Rfl: 0 .  acetaminophen (TYLENOL) 325 MG tablet, Take 650 mg by mouth every 6 (six) hours as needed for mild pain. , Disp: , Rfl:  .  albuterol (PROVENTIL HFA;VENTOLIN HFA) 108 (90 Base) MCG/ACT inhaler, Inhale 2 puffs into the  lungs every 6 (six) hours as needed for wheezing or shortness of breath. (Patient not taking: Reported on 12/03/2017), Disp: 1 Inhaler, Rfl: 1 .  aspirin EC 81 MG tablet, Take 81 mg by mouth daily., Disp: , Rfl:  .  nystatin (MYCOSTATIN) 100000 UNIT/ML suspension, Use as directed 5 mLs (500,000 Units total) in the mouth or throat 4 (four) times daily. (Patient not taking: Reported on 12/03/2017), Disp: 60 mL, Rfl: 0 .  traMADol (ULTRAM) 50 MG tablet, Take 1 tablet (50 mg total) by mouth every 6 (six) hours as needed. (Patient not taking: Reported on 12/03/2017), Disp: 12 tablet, Rfl: 0 No current facility-administered medications for this visit.   Facility-Administered Medications Ordered in Other Visits:  .  sodium chloride flush (NS) 0.9 % injection 10  mL, 10 mL, Intravenous, PRN, Sindy Guadeloupe, MD, 10 mL at 03/26/17 0948 .  sodium chloride flush (NS) 0.9 % injection 10 mL, 10 mL, Intravenous, PRN, Sindy Guadeloupe, MD, 10 mL at 04/16/17 1006  Physical exam:  Vitals:   12/03/17 0927  BP: 117/71  Pulse: 92  Resp: 18  Temp: 97.7 F (36.5 C)  TempSrc: Tympanic  SpO2: 98%  Weight: 112 lb 15.8 oz (51.2 kg)  Height: 5\' 7"  (1.702 m)   Physical Exam  Constitutional: She is oriented to person, place, and time.  Thin frail elderly woman in no acute distress  HENT:  Head: Normocephalic and atraumatic.  Eyes: Pupils are equal, round, and reactive to light. EOM are normal.  Neck: Normal range of motion.  Cardiovascular: Normal rate, regular rhythm and normal heart sounds.  Pulmonary/Chest: Effort normal and breath sounds normal.  Abdominal: Soft. Bowel sounds are normal.  Neurological: She is alert and oriented to person, place, and time.  Skin: Skin is warm and dry.     CMP Latest Ref Rng & Units 12/03/2017  Glucose 70 - 99 mg/dL 126(H)  BUN 8 - 23 mg/dL 20  Creatinine 0.44 - 1.00 mg/dL 0.81  Sodium 135 - 145 mmol/L 136  Potassium 3.5 - 5.1 mmol/L 4.2  Chloride 98 - 111 mmol/L 97(L)  CO2 22  - 32 mmol/L 26  Calcium 8.9 - 10.3 mg/dL 8.9  Total Protein 6.5 - 8.1 g/dL -  Total Bilirubin 0.3 - 1.2 mg/dL -  Alkaline Phos 38 - 126 U/L -  AST 15 - 41 U/L -  ALT 0 - 44 U/L -   CBC Latest Ref Rng & Units 12/03/2017  WBC 3.6 - 11.0 K/uL 20.1(H)  Hemoglobin 12.0 - 16.0 g/dL 12.9  Hematocrit 35.0 - 47.0 % 40.0  Platelets 150 - 440 K/uL 419    No images are attached to the encounter.  Ct Head Wo Contrast  Result Date: 11/19/2017 CLINICAL DATA:  Acute headache with normal neuro exam right-sided facial numbness and headache EXAM: CT HEAD WITHOUT CONTRAST TECHNIQUE: Contiguous axial images were obtained from the base of the skull through the vertex without intravenous contrast. COMPARISON:  10/29/2017 FINDINGS: Brain: No evidence of acute infarction, hemorrhage, hydrocephalus, extra-axial collection or mass lesion/mass effect. 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 Vascular: Atherosclerotic calcification Skull: Negative Sinuses/Orbits: Under pneumatized left mastoid with opacification, stable. Partial right mastoid opacification that is progressed. Negative nasopharynx. IMPRESSION: 1. No acute intracranial finding. 2. Partial right mastoid opacification that is progressed from 10/29/2017. 3. Chronic left mastoid opacification.  The nasopharynx is clear. Electronically Signed   By: Monte Fantasia M.D.   On: 11/19/2017 13:07   Ct Chest W Contrast  Result Date: 11/27/2017 CLINICAL DATA:  Stage IIIB small cell lung cancer status post chemotherapy and radiation therapy. Remote history of breast cancer. Recent headache. EXAM: CT CHEST, ABDOMEN, AND PELVIS WITH CONTRAST TECHNIQUE: Multidetector CT imaging of the chest, abdomen and pelvis was performed following the standard protocol during bolus administration of intravenous contrast. CONTRAST:  22mL ISOVUE-300 IOPAMIDOL (ISOVUE-300) INJECTION 61% COMPARISON:  08/29/2017 FINDINGS: CT CHEST FINDINGS Cardiovascular: Coronary, aortic arch,  and branch vessel atherosclerotic vascular disease. Left Port-A-Cath tip: Lower SVC. Mitral and aortic valve calcifications. Mediastinum/Nodes: AP window lymph node 0.4 cm in short axis on image 20/2. Right hilar node 0.6 cm in short axis on image 24/2, formerly 0.7 cm. There is contrast medium in the mid and distal esophagus compatible with reflux  or dysmotility. Lungs/Pleura: Nodular biapical pleuroparenchymal scarring appear stable. Paraseptal emphysema most notable at the apices. Nodular region associated with scarring peripherally in the right upper lobe measures 0.9 by 0.9 cm, formerly 1.1 by 1.1 cm on 08/29/2017. A new right upper lobe 3.7 by 2.5 cm density on image 48/0 also contains a 1.4 by 1.1 cm internal gas density. This could reflect cavitary pneumonia or a cavitary mass. Along the lower margin of this process there is previously a nodular density measuring 2.1 by 2.2 cm, this nodular portion currently measures 2.0 by 1.1 cm. Peripheral airspace opacity in the superior segment right lower lobe measures about 2.2 by 2.1 cm, previously 2.8 by 2.5 cm. Stable subpleural nodularity in the left lung peripherally. Mild increase in localized airspace opacity medially in the lingula/left upper lobe on image 63/4, with some increased in adjacent reticulonodular opacities favoring atypical infectious bronchiolitis. There is some bandlike peribronchovascular thickening and volume loss in the left lower lobe which is increased from prior. Scattered tiny nodules characteristic of atypical infectious bronchiolitis are present both lungs with favoring the right middle lobe and right lower lobe, increased from prior. Airway thickening is present with scattered airway plugging. Musculoskeletal: Left mastectomy.  Levoconvex midthoracic scoliosis. CT ABDOMEN PELVIS FINDINGS Hepatobiliary: Unremarkable Pancreas: Unremarkable Spleen: Unremarkable Adrenals/Urinary Tract: Bilateral extrarenal pelvis not appreciably changed  from prior. Otherwise unremarkable. Stomach/Bowel: Mildly distended stomach and proximal duodenum. Small caliber transverse duodenum anterior to the infrarenal abdominal aortic aneurysm. Borderline prominence of additional small bowel loops. Prominent stool throughout the colon favors constipation. Sigmoid colon diverticulosis. Vascular/Lymphatic: Aorta bi-iliac stent graft. Right renal artery stent. No obvious/large endoleak. The thrombosed infrarenal abdominal aortic aneurysm spanned by the stent graft measures 4.5 by 3.8 cm (formerly the same by my measurements). No pathologic adenopathy observed. Reproductive: Unremarkable Other: No supplemental non-categorized findings. Musculoskeletal: Unremarkable IMPRESSION: 1. Complex appearance in the lungs including some new airspace opacity in the right upper lobe and lingula along with some increase in atypical infectious bronchiolitis scattered in the lungs; but for the most part this is not compelling for malignancy, and other areas of prior density or airspace opacity appear improved. Some of the appearance is likely infectious and some may be due to radiation pneumonitis. Surveillance is likely warranted. No appreciable adenopathy. 2. Other imaging findings of potential clinical significance: Aortic Atherosclerosis (ICD10-I70.0). Coronary atherosclerosis. Infrarenal aortic aneurysm NOS (ICD10-I71.9). The aneurysm is stable in size and spanned by a stent graft without complicating feature. Emphysema (ICD10-J43.9). Airway thickening with scattered airway plugging. Thoracic scoliosis. Prominent stool throughout the colon favors constipation. Borderline prominence of caliber of loops of small bowel. Sigmoid colon diverticulosis. Contrast in the esophagus indicating dysmotility or reflux. Electronically Signed   By: Van Clines M.D.   On: 11/27/2017 13:31   Mr Jeri Cos BO Contrast  Result Date: 11/22/2017 CLINICAL DATA:  Prophylactic cranial radiation for small  cell lung cancer. Occasional headache. EXAM: MRI HEAD WITHOUT AND WITH CONTRAST TECHNIQUE: Multiplanar, multiecho pulse sequences of the brain and surrounding structures were obtained without and with intravenous contrast. CONTRAST:  22mL MULTIHANCE GADOBENATE DIMEGLUMINE 529 MG/ML IV SOLN COMPARISON:  09/12/2017 FINDINGS: Brain: No mass or swelling to suggest metastatic disease. Small focus of enhancement in the right internal auditory canal is from a vascular loop based on 02/21/2017 MRI. No abnormal leptomeningeal enhancement. Mild chronic small vessel ischemic type change in the cerebral white matter. Small remote left posterior frontal and parietal cortex infarcts. Age normal brain volume Vascular: Major flow  voids are preserved Skull and upper cervical spine: No evidence of marrow lesion. Sinuses/Orbits: Secretions in the nasopharynx. Bilateral mastoid opacification as highlighted on CT from 3 days ago. Bilateral cataract resection IMPRESSION: 1. No evidence of metastatic disease. 2. Mild chronic small vessel ischemia. 3. Bilateral mastoid opacification. Electronically Signed   By: Monte Fantasia M.D.   On: 11/22/2017 12:48   Ct Abdomen Pelvis W Contrast  Result Date: 11/27/2017 CLINICAL DATA:  Stage IIIB small cell lung cancer status post chemotherapy and radiation therapy. Remote history of breast cancer. Recent headache. EXAM: CT CHEST, ABDOMEN, AND PELVIS WITH CONTRAST TECHNIQUE: Multidetector CT imaging of the chest, abdomen and pelvis was performed following the standard protocol during bolus administration of intravenous contrast. CONTRAST:  23mL ISOVUE-300 IOPAMIDOL (ISOVUE-300) INJECTION 61% COMPARISON:  08/29/2017 FINDINGS: CT CHEST FINDINGS Cardiovascular: Coronary, aortic arch, and branch vessel atherosclerotic vascular disease. Left Port-A-Cath tip: Lower SVC. Mitral and aortic valve calcifications. Mediastinum/Nodes: AP window lymph node 0.4 cm in short axis on image 20/2. Right hilar node  0.6 cm in short axis on image 24/2, formerly 0.7 cm. There is contrast medium in the mid and distal esophagus compatible with reflux or dysmotility. Lungs/Pleura: Nodular biapical pleuroparenchymal scarring appear stable. Paraseptal emphysema most notable at the apices. Nodular region associated with scarring peripherally in the right upper lobe measures 0.9 by 0.9 cm, formerly 1.1 by 1.1 cm on 08/29/2017. A new right upper lobe 3.7 by 2.5 cm density on image 48/0 also contains a 1.4 by 1.1 cm internal gas density. This could reflect cavitary pneumonia or a cavitary mass. Along the lower margin of this process there is previously a nodular density measuring 2.1 by 2.2 cm, this nodular portion currently measures 2.0 by 1.1 cm. Peripheral airspace opacity in the superior segment right lower lobe measures about 2.2 by 2.1 cm, previously 2.8 by 2.5 cm. Stable subpleural nodularity in the left lung peripherally. Mild increase in localized airspace opacity medially in the lingula/left upper lobe on image 63/4, with some increased in adjacent reticulonodular opacities favoring atypical infectious bronchiolitis. There is some bandlike peribronchovascular thickening and volume loss in the left lower lobe which is increased from prior. Scattered tiny nodules characteristic of atypical infectious bronchiolitis are present both lungs with favoring the right middle lobe and right lower lobe, increased from prior. Airway thickening is present with scattered airway plugging. Musculoskeletal: Left mastectomy.  Levoconvex midthoracic scoliosis. CT ABDOMEN PELVIS FINDINGS Hepatobiliary: Unremarkable Pancreas: Unremarkable Spleen: Unremarkable Adrenals/Urinary Tract: Bilateral extrarenal pelvis not appreciably changed from prior. Otherwise unremarkable. Stomach/Bowel: Mildly distended stomach and proximal duodenum. Small caliber transverse duodenum anterior to the infrarenal abdominal aortic aneurysm. Borderline prominence of  additional small bowel loops. Prominent stool throughout the colon favors constipation. Sigmoid colon diverticulosis. Vascular/Lymphatic: Aorta bi-iliac stent graft. Right renal artery stent. No obvious/large endoleak. The thrombosed infrarenal abdominal aortic aneurysm spanned by the stent graft measures 4.5 by 3.8 cm (formerly the same by my measurements). No pathologic adenopathy observed. Reproductive: Unremarkable Other: No supplemental non-categorized findings. Musculoskeletal: Unremarkable IMPRESSION: 1. Complex appearance in the lungs including some new airspace opacity in the right upper lobe and lingula along with some increase in atypical infectious bronchiolitis scattered in the lungs; but for the most part this is not compelling for malignancy, and other areas of prior density or airspace opacity appear improved. Some of the appearance is likely infectious and some may be due to radiation pneumonitis. Surveillance is likely warranted. No appreciable adenopathy. 2. Other imaging findings of potential  clinical significance: Aortic Atherosclerosis (ICD10-I70.0). Coronary atherosclerosis. Infrarenal aortic aneurysm NOS (ICD10-I71.9). The aneurysm is stable in size and spanned by a stent graft without complicating feature. Emphysema (ICD10-J43.9). Airway thickening with scattered airway plugging. Thoracic scoliosis. Prominent stool throughout the colon favors constipation. Borderline prominence of caliber of loops of small bowel. Sigmoid colon diverticulosis. Contrast in the esophagus indicating dysmotility or reflux. Electronically Signed   By: Van Clines M.D.   On: 11/27/2017 13:31     Assessment and plan- Patient is a 72 y.o. female with limited stage small cell lung cancer s/p cisplatin/etoposide and RT in dec 2018 and prophylactic PCI. She is in remission and here for routine survellance  I have reviewed CT chest abdomen pelvis images independently and discussed findings with the patient.  She was noted to new RUL density measuring about 3.7X 2.5 cm suggestive of infectious/ inflammatory etiology. No convincing evidence of malignancy. Patient has completed 1 week of augmentin and is currently on high dose prednisone for possible temporal arteritis pending biopsy by [facility] this week. Her leucocytosis/ neutrophilia is due to prednisone.  I will repeat her ct scans in 3 months and see how the findings of infection/ inflammation are doing after antibiotics and steroids. She did have recent brain MRI in June 2019 which did not show any metastatic disease. Repeat one in 3 months in September 2019.    Visit Diagnosis 1. Small cell lung cancer, right upper lobe ([place])   2. Encounter for follow-up surveillance of lung cancer      Dr. Randa Evens, MD, MPH [facility] [phone] 12/03/2017 12:12 PM

## 2017-12-03 NOTE — Progress Notes (Signed)
No new changes noted today 

## 2017-12-04 ENCOUNTER — Encounter
Admission: RE | Admit: 2017-12-04 | Discharge: 2017-12-04 | Disposition: A | Discharge status: Home / Self Care | Payer: Medicare Other | Source: Ambulatory Visit | Attending: Otolaryngology | Admitting: Otolaryngology

## 2017-12-04 ENCOUNTER — Other Ambulatory Visit: Payer: Self-pay

## 2017-12-04 HISTORY — DX: Dyspnea, unspecified: R06.00

## 2017-12-04 HISTORY — DX: Headache: R51

## 2017-12-04 HISTORY — DX: Anemia, unspecified: D64.9

## 2017-12-04 HISTORY — DX: Headache, unspecified: R51.9

## 2017-12-04 NOTE — Pre-Procedure Instructions (Signed)
Sindy Guadeloupe, MD  Physician  Oncology  Progress Notes  Signed  Encounter Date:  12/03/2017          Signed      Expand All Collapse All       Show:Clear all [x] Manual[x] Template[x] Copied  Added by: [x] Sindy Guadeloupe, MD   [] Hover for details       Hematology/Oncology Consult note [facility] [phone] [phone]  Patient Care Team: Gayland Curry, MD as PCP - General (Family Medicine) Gayland Curry, MD (Family Medicine) Telford Nab, RN as Registered Nurse   Name of the patient: Roberta [name]  [id number]  06-09-1945   Date of visit: 12/03/17  Diagnosis- 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 Scans after 4 cycles showed good response to treatment. She completed PCI. She remains in remission and under surveillance.She does have a history of breast cancer in 2002 status post left mastectomy followed by adjuvant chemotherapy. This was treated by Dr. Oliva Bustard. She did not receive any adjuvant radiation.    Interval history- patient was seen in the ER for right sided facial pain on 11/19/17. She was treated with augmentin for 1 week. She has seen Dr. Kathyrn Sheriff for the same who is working her up for possible temporal arteritis and she is scheduled to get temporal artery biopsy this week. She is still experiencing on and off headaches. She iss on high dose prednisone 60 mg  ECOG PS- 2 Pain scale- 4 Opioid associated constipation- no  Review of systems- Review of Systems  Constitutional: Positive for malaise/fatigue. Negative for chills, fever and weight loss.  HENT: Negative for congestion, ear discharge and nosebleeds.   Eyes:  Negative for blurred vision.  Respiratory: Negative for cough, hemoptysis, sputum production, shortness of breath and wheezing.   Cardiovascular: Negative for chest pain, palpitations, orthopnea and claudication.  Gastrointestinal: Negative for abdominal pain, blood in stool, constipation, diarrhea, heartburn, melena, nausea and vomiting.  Genitourinary: Negative for dysuria, flank pain, frequency, hematuria and urgency.  Musculoskeletal: Negative for back pain, joint pain and myalgias.  Skin: Negative for rash.  Neurological: Positive for headaches. Negative for dizziness, tingling, focal weakness, seizures and weakness.  Endo/Heme/Allergies: Does not bruise/bleed easily.  Psychiatric/Behavioral: Negative for depression and suicidal ideas. The patient does not have insomnia.      No Known Allergies       Past Medical History:  Diagnosis Date  . AAA (abdominal aortic aneurysm) ([place])   . Breast cancer ([place]) 2002   left  . COPD (chronic obstructive pulmonary disease) ([place])   . High cholesterol   . Personal history of chemotherapy   . Small cell lung cancer ([place])   . Tobacco abuse           Past Surgical History:  Procedure Laterality Date  . ABDOMINAL HYSTERECTOMY    . ENDOBRONCHIAL ULTRASOUND N/A 02/19/2017   Procedure: ENDOBRONCHIAL ULTRASOUND;  Surgeon: Flora Lipps, MD;  Location: [facility] ORS;  Service: Cardiopulmonary;  Laterality: N/A;  . ENDOVASCULAR REPAIR/STENT GRAFT N/A 12/06/2016   Procedure: Endovascular Repair/Stent Graft;  Surgeon: Katha Cabal, MD;  Location: [place] [facility];  Service: Cardiovascular;  Laterality: N/A;  . IR FLUORO GUIDE PORT INSERTION RIGHT  03/02/2017  . MASTECTOMY Left 2002  with sentinel node     Social History        Socioeconomic History  . Marital status: Widowed    Spouse name: Not on file  . Number of children: Not on file  . Years of education: Not on file  . Highest education level: Not on file    Occupational History  . Not on file  Social Needs  . Financial resource strain: Not on file  . Food insecurity:    Worry: Not on file    Inability: Not on file  . Transportation needs:    Medical: Not on file    Non-medical: Not on file  Tobacco Use  . Smoking status: Current Every Day Smoker    Packs/day: 0.50    Types: Cigarettes  . Smokeless tobacco: Never Used  Substance and Sexual Activity  . Alcohol use: No  . Drug use: No  . Sexual activity: Never  Lifestyle  . Physical activity:    Days per week: Not on file    Minutes per session: Not on file  . Stress: Not on file  Relationships  . Social connections:    Talks on phone: Not on file    Gets together: Not on file    Attends religious service: Not on file    Active member of club or organization: Not on file    Attends meetings of clubs or organizations: Not on file    Relationship status: Not on file  . Intimate partner violence:    Fear of current or ex partner: Not on file    Emotionally abused: Not on file    Physically abused: Not on file    Forced sexual activity: Not on file  Other Topics Concern  . Not on file  Social History Narrative  . Not on file         Family History  Problem Relation Age of Onset  . CAD Mother   . Colon cancer Brother   . Breast cancer Neg Hx      Current Outpatient Medications:  .  atorvastatin (LIPITOR) 10 MG tablet, Take 10 mg by mouth at bedtime. , Disp: , Rfl:  .  fluticasone-salmeterol (ADVAIR HFA) 115-21 MCG/ACT inhaler, Inhale 2 puffs into the lungs 2 (two) times daily., Disp: , Rfl:  .  Multiple Vitamin (MULTIVITAMIN) tablet, Take 1 tablet by mouth daily., Disp: , Rfl:  .  potassium chloride SA (K-DUR,KLOR-CON) 20 MEQ tablet, Take 1 tablet (20 mEq total) by mouth daily., Disp: 7 tablet, Rfl: 0 .  predniSONE (STERAPRED UNI-PAK 21 TAB) 10 MG (21) TBPK tablet, 6 tabs PO x 1 day 5 tabs PO x 1 day 4 tabs PO x 1 day 3 tabs  PO x 1 day 2 tabs PO x 1 day 1 tab PO x 1 day and stop (Patient taking differently: Take 40 mg by mouth daily. X10DAYS), Disp: 21 tablet, Rfl: 0 .  acetaminophen (TYLENOL) 325 MG tablet, Take 650 mg by mouth every 6 (six) hours as needed for mild pain. , Disp: , Rfl:  .  albuterol (PROVENTIL HFA;VENTOLIN HFA) 108 (90 Base) MCG/ACT inhaler, Inhale 2 puffs into the lungs every 6 (six) hours as needed for wheezing or shortness of breath. (Patient not taking: Reported on 12/03/2017), Disp: 1 Inhaler, Rfl: 1 .  aspirin EC 81 MG tablet, Take 81 mg by mouth daily., Disp: , Rfl:  .  nystatin (MYCOSTATIN) 100000 UNIT/ML suspension, Use as directed 5 mLs (500,000 Units total) in  the mouth or throat 4 (four) times daily. (Patient not taking: Reported on 12/03/2017), Disp: 60 mL, Rfl: 0 .  traMADol (ULTRAM) 50 MG tablet, Take 1 tablet (50 mg total) by mouth every 6 (six) hours as needed. (Patient not taking: Reported on 12/03/2017), Disp: 12 tablet, Rfl: 0 No current facility-administered medications for this visit.   Facility-Administered Medications Ordered in Other Visits:  .  sodium chloride flush (NS) 0.9 % injection 10 mL, 10 mL, Intravenous, PRN, Sindy Guadeloupe, MD, 10 mL at 03/26/17 0948 .  sodium chloride flush (NS) 0.9 % injection 10 mL, 10 mL, Intravenous, PRN, Sindy Guadeloupe, MD, 10 mL at 04/16/17 1006  Physical exam:     Vitals:   12/03/17 0927  BP: 117/71  Pulse: 92  Resp: 18  Temp: 97.7 F (36.5 C)  TempSrc: Tympanic  SpO2: 98%  Weight: 112 lb 15.8 oz (51.2 kg)  Height: 5\' 7"  (1.702 m)   Physical Exam  Constitutional: She is oriented to person, place, and time.  Thin frail elderly woman in no acute distress  HENT:  Head: Normocephalic and atraumatic.  Eyes: Pupils are equal, round, and reactive to light. EOM are normal.  Neck: Normal range of motion.  Cardiovascular: Normal rate, regular rhythm and normal heart sounds.  Pulmonary/Chest: Effort normal and breath sounds normal.    Abdominal: Soft. Bowel sounds are normal.  Neurological: She is alert and oriented to person, place, and time.  Skin: Skin is warm and dry.     CMP Latest Ref Rng & Units 12/03/2017  Glucose 70 - 99 mg/dL 126(H)  BUN 8 - 23 mg/dL 20  Creatinine 0.44 - 1.00 mg/dL 0.81  Sodium 135 - 145 mmol/L 136  Potassium 3.5 - 5.1 mmol/L 4.2  Chloride 98 - 111 mmol/L 97(L)  CO2 22 - 32 mmol/L 26  Calcium 8.9 - 10.3 mg/dL 8.9  Total Protein 6.5 - 8.1 g/dL -  Total Bilirubin 0.3 - 1.2 mg/dL -  Alkaline Phos 38 - 126 U/L -  AST 15 - 41 U/L -  ALT 0 - 44 U/L -   CBC Latest Ref Rng & Units 12/03/2017  WBC 3.6 - 11.0 K/uL 20.1(H)  Hemoglobin 12.0 - 16.0 g/dL 12.9  Hematocrit 35.0 - 47.0 % 40.0  Platelets 150 - 440 K/uL 419    No images are attached to the encounter.   ImagingResults  Ct Head Wo Contrast  Result Date: 11/19/2017 CLINICAL DATA:  Acute headache with normal neuro exam right-sided facial numbness and headache EXAM: CT HEAD WITHOUT CONTRAST TECHNIQUE: Contiguous axial images were obtained from the base of the skull through the vertex without intravenous contrast. COMPARISON:  10/29/2017 FINDINGS: Brain: No evidence of acute infarction, hemorrhage, hydrocephalus, extra-axial collection or mass lesion/mass effect. 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 Vascular: Atherosclerotic calcification Skull: Negative Sinuses/Orbits: Under pneumatized left mastoid with opacification, stable. Partial right mastoid opacification that is progressed. Negative nasopharynx. IMPRESSION: 1. No acute intracranial finding. 2. Partial right mastoid opacification that is progressed from 10/29/2017. 3. Chronic left mastoid opacification.  The nasopharynx is clear. Electronically Signed   By: Monte Fantasia M.D.   On: 11/19/2017 13:07   Ct Chest W Contrast  Result Date: 11/27/2017 CLINICAL DATA:  Stage IIIB small cell lung cancer status post chemotherapy and radiation therapy. Remote history  of breast cancer. Recent headache. EXAM: CT CHEST, ABDOMEN, AND PELVIS WITH CONTRAST TECHNIQUE: Multidetector CT imaging of the chest, abdomen and pelvis was performed following  the standard protocol during bolus administration of intravenous contrast. CONTRAST:  50mL ISOVUE-300 IOPAMIDOL (ISOVUE-300) INJECTION 61% COMPARISON:  08/29/2017 FINDINGS: CT CHEST FINDINGS Cardiovascular: Coronary, aortic arch, and branch vessel atherosclerotic vascular disease. Left Port-A-Cath tip: Lower SVC. Mitral and aortic valve calcifications. Mediastinum/Nodes: AP window lymph node 0.4 cm in short axis on image 20/2. Right hilar node 0.6 cm in short axis on image 24/2, formerly 0.7 cm. There is contrast medium in the mid and distal esophagus compatible with reflux or dysmotility. Lungs/Pleura: Nodular biapical pleuroparenchymal scarring appear stable. Paraseptal emphysema most notable at the apices. Nodular region associated with scarring peripherally in the right upper lobe measures 0.9 by 0.9 cm, formerly 1.1 by 1.1 cm on 08/29/2017. A new right upper lobe 3.7 by 2.5 cm density on image 48/0 also contains a 1.4 by 1.1 cm internal gas density. This could reflect cavitary pneumonia or a cavitary mass. Along the lower margin of this process there is previously a nodular density measuring 2.1 by 2.2 cm, this nodular portion currently measures 2.0 by 1.1 cm. Peripheral airspace opacity in the superior segment right lower lobe measures about 2.2 by 2.1 cm, previously 2.8 by 2.5 cm. Stable subpleural nodularity in the left lung peripherally. Mild increase in localized airspace opacity medially in the lingula/left upper lobe on image 63/4, with some increased in adjacent reticulonodular opacities favoring atypical infectious bronchiolitis. There is some bandlike peribronchovascular thickening and volume loss in the left lower lobe which is increased from prior. Scattered tiny nodules characteristic of atypical infectious bronchiolitis  are present both lungs with favoring the right middle lobe and right lower lobe, increased from prior. Airway thickening is present with scattered airway plugging. Musculoskeletal: Left mastectomy.  Levoconvex midthoracic scoliosis. CT ABDOMEN PELVIS FINDINGS Hepatobiliary: Unremarkable Pancreas: Unremarkable Spleen: Unremarkable Adrenals/Urinary Tract: Bilateral extrarenal pelvis not appreciably changed from prior. Otherwise unremarkable. Stomach/Bowel: Mildly distended stomach and proximal duodenum. Small caliber transverse duodenum anterior to the infrarenal abdominal aortic aneurysm. Borderline prominence of additional small bowel loops. Prominent stool throughout the colon favors constipation. Sigmoid colon diverticulosis. Vascular/Lymphatic: Aorta bi-iliac stent graft. Right renal artery stent. No obvious/large endoleak. The thrombosed infrarenal abdominal aortic aneurysm spanned by the stent graft measures 4.5 by 3.8 cm (formerly the same by my measurements). No pathologic adenopathy observed. Reproductive: Unremarkable Other: No supplemental non-categorized findings. Musculoskeletal: Unremarkable IMPRESSION: 1. Complex appearance in the lungs including some new airspace opacity in the right upper lobe and lingula along with some increase in atypical infectious bronchiolitis scattered in the lungs; but for the most part this is not compelling for malignancy, and other areas of prior density or airspace opacity appear improved. Some of the appearance is likely infectious and some may be due to radiation pneumonitis. Surveillance is likely warranted. No appreciable adenopathy. 2. Other imaging findings of potential clinical significance: Aortic Atherosclerosis (ICD10-I70.0). Coronary atherosclerosis. Infrarenal aortic aneurysm NOS (ICD10-I71.9). The aneurysm is stable in size and spanned by a stent graft without complicating feature. Emphysema (ICD10-J43.9). Airway thickening with scattered airway plugging.  Thoracic scoliosis. Prominent stool throughout the colon favors constipation. Borderline prominence of caliber of loops of small bowel. Sigmoid colon diverticulosis. Contrast in the esophagus indicating dysmotility or reflux. Electronically Signed   By: Van Clines M.D.   On: 11/27/2017 13:31   Mr Jeri Cos QI Contrast  Result Date: 11/22/2017 CLINICAL DATA:  Prophylactic cranial radiation for small cell lung cancer. Occasional headache. EXAM: MRI HEAD WITHOUT AND WITH CONTRAST TECHNIQUE: Multiplanar, multiecho pulse sequences of the brain  and surrounding structures were obtained without and with intravenous contrast. CONTRAST:  48mL MULTIHANCE GADOBENATE DIMEGLUMINE 529 MG/ML IV SOLN COMPARISON:  09/12/2017 FINDINGS: Brain: No mass or swelling to suggest metastatic disease. Small focus of enhancement in the right internal auditory canal is from a vascular loop based on 02/21/2017 MRI. No abnormal leptomeningeal enhancement. Mild chronic small vessel ischemic type change in the cerebral white matter. Small remote left posterior frontal and parietal cortex infarcts. Age normal brain volume Vascular: Major flow voids are preserved Skull and upper cervical spine: No evidence of marrow lesion. Sinuses/Orbits: Secretions in the nasopharynx. Bilateral mastoid opacification as highlighted on CT from 3 days ago. Bilateral cataract resection IMPRESSION: 1. No evidence of metastatic disease. 2. Mild chronic small vessel ischemia. 3. Bilateral mastoid opacification. Electronically Signed   By: Monte Fantasia M.D.   On: 11/22/2017 12:48   Ct Abdomen Pelvis W Contrast  Result Date: 11/27/2017 CLINICAL DATA:  Stage IIIB small cell lung cancer status post chemotherapy and radiation therapy. Remote history of breast cancer. Recent headache. EXAM: CT CHEST, ABDOMEN, AND PELVIS WITH CONTRAST TECHNIQUE: Multidetector CT imaging of the chest, abdomen and pelvis was performed following the standard protocol during bolus  administration of intravenous contrast. CONTRAST:  59mL ISOVUE-300 IOPAMIDOL (ISOVUE-300) INJECTION 61% COMPARISON:  08/29/2017 FINDINGS: CT CHEST FINDINGS Cardiovascular: Coronary, aortic arch, and branch vessel atherosclerotic vascular disease. Left Port-A-Cath tip: Lower SVC. Mitral and aortic valve calcifications. Mediastinum/Nodes: AP window lymph node 0.4 cm in short axis on image 20/2. Right hilar node 0.6 cm in short axis on image 24/2, formerly 0.7 cm. There is contrast medium in the mid and distal esophagus compatible with reflux or dysmotility. Lungs/Pleura: Nodular biapical pleuroparenchymal scarring appear stable. Paraseptal emphysema most notable at the apices. Nodular region associated with scarring peripherally in the right upper lobe measures 0.9 by 0.9 cm, formerly 1.1 by 1.1 cm on 08/29/2017. A new right upper lobe 3.7 by 2.5 cm density on image 48/0 also contains a 1.4 by 1.1 cm internal gas density. This could reflect cavitary pneumonia or a cavitary mass. Along the lower margin of this process there is previously a nodular density measuring 2.1 by 2.2 cm, this nodular portion currently measures 2.0 by 1.1 cm. Peripheral airspace opacity in the superior segment right lower lobe measures about 2.2 by 2.1 cm, previously 2.8 by 2.5 cm. Stable subpleural nodularity in the left lung peripherally. Mild increase in localized airspace opacity medially in the lingula/left upper lobe on image 63/4, with some increased in adjacent reticulonodular opacities favoring atypical infectious bronchiolitis. There is some bandlike peribronchovascular thickening and volume loss in the left lower lobe which is increased from prior. Scattered tiny nodules characteristic of atypical infectious bronchiolitis are present both lungs with favoring the right middle lobe and right lower lobe, increased from prior. Airway thickening is present with scattered airway plugging. Musculoskeletal: Left mastectomy.  Levoconvex  midthoracic scoliosis. CT ABDOMEN PELVIS FINDINGS Hepatobiliary: Unremarkable Pancreas: Unremarkable Spleen: Unremarkable Adrenals/Urinary Tract: Bilateral extrarenal pelvis not appreciably changed from prior. Otherwise unremarkable. Stomach/Bowel: Mildly distended stomach and proximal duodenum. Small caliber transverse duodenum anterior to the infrarenal abdominal aortic aneurysm. Borderline prominence of additional small bowel loops. Prominent stool throughout the colon favors constipation. Sigmoid colon diverticulosis. Vascular/Lymphatic: Aorta bi-iliac stent graft. Right renal artery stent. No obvious/large endoleak. The thrombosed infrarenal abdominal aortic aneurysm spanned by the stent graft measures 4.5 by 3.8 cm (formerly the same by my measurements). No pathologic adenopathy observed. Reproductive: Unremarkable Other: No supplemental non-categorized  findings. Musculoskeletal: Unremarkable IMPRESSION: 1. Complex appearance in the lungs including some new airspace opacity in the right upper lobe and lingula along with some increase in atypical infectious bronchiolitis scattered in the lungs; but for the most part this is not compelling for malignancy, and other areas of prior density or airspace opacity appear improved. Some of the appearance is likely infectious and some may be due to radiation pneumonitis. Surveillance is likely warranted. No appreciable adenopathy. 2. Other imaging findings of potential clinical significance: Aortic Atherosclerosis (ICD10-I70.0). Coronary atherosclerosis. Infrarenal aortic aneurysm NOS (ICD10-I71.9). The aneurysm is stable in size and spanned by a stent graft without complicating feature. Emphysema (ICD10-J43.9). Airway thickening with scattered airway plugging. Thoracic scoliosis. Prominent stool throughout the colon favors constipation. Borderline prominence of caliber of loops of small bowel. Sigmoid colon diverticulosis. Contrast in the esophagus indicating  dysmotility or reflux. Electronically Signed   By: Van Clines M.D.   On: 11/27/2017 13:31      Assessment and plan- Patient is a 72 y.o. female with limited stage small cell lung cancer s/p cisplatin/etoposide and RT in dec 2018 and prophylactic PCI. She is in remission and here for routine survellance  I have reviewed CT chest abdomen pelvis images independently and discussed findings with the patient. She was noted to new RUL density measuring about 3.7X 2.5 cm suggestive of infectious/ inflammatory etiology. No convincing evidence of malignancy. Patient has completed 1 week of augmentin and is currently on high dose prednisone for possible temporal arteritis pending biopsy by [facility] this week. Her leucocytosis/ neutrophilia is due to prednisone.  I will repeat her ct scans in 3 months and see how the findings of infection/ inflammation are doing after antibiotics and steroids. She did have recent brain MRI in June 2019 which did not show any metastatic disease. Repeat one in 3 months in September 2019.    Visit Diagnosis 1. Small cell lung cancer, right upper lobe ([place])   2. Encounter for follow-up surveillance of lung cancer      Dr. Randa Evens, MD, MPH [facility] [phone] 12/03/2017 12:12 PM                         Electronically signed by Sindy Guadeloupe, MD at 12/03/2017 12:26 PM     Office Visit on 12/03/2017        Detailed Report

## 2017-12-04 NOTE — Pre-Procedure Instructions (Signed)
EKG DONE IN ED ON 10-31-17 AND PT WAS TACHY AT 112.  PT WAS DX WITH SEPSIS

## 2017-12-04 NOTE — Pre-Procedure Instructions (Signed)
Progress Notes - in this encounter  Sydnee Levans, MD - 11/03/2016 10:00 AM EDT Formatting of this note may be different from the original.   Chief Complaint: Chief Complaint  Patient presents with  . Establish Care  . Pre-op Exam  dr Delana Meyer  Date of Service: 11/03/2016 Date of Birth: 05-17-1946 PCP: Verline Lema, MD  History of Present Illness: Roberta [name] is a 72 y.o.female patient who presents in referral for risk stratification preoperative evaluation prior to abdominal aortic aneurysm intervention. Patient has no known cardiac history. She does have some dyspnea on exertion. She has smoked for many years. She was noted to have an aneurysm which will require intervention. Electrocardiogram reveals sinus rhythm with a PR interval of 108 ms, QRS duration of 88 ms with a QTC of 439 ms with a QRS axis 73. There is no ischemia. Patient does have some limited ambulation due to lower extremity discomfort and shortness of breath. She denies significant chest pain. She does have shortness of breath. She does have risk factors for coronary disease including vascular disease, family history, hyperlipidemia, current tobacco abuse.  Past Medical and Surgical History  Past Medical History Past Medical History:  Diagnosis Date  . Cancer (CMS-HCC)  left breast cancer, in 2002- pt did chemo/ no radiation  . COPD (chronic obstructive pulmonary disease) , unspecified (CMS-HCC)  dr. Rosalva Ferron  . Diverticulosis 05/15/2014  Sigmoid Colon  . Pure hypercholesterolemia 03/11/2012   Past Surgical History She has a past surgical history that includes laproscopic abd surgery; Cataract extraction; Mastectomy; and Colonoscopy (05/15/2014).   Medications and Allergies  Current Medications  Current Outpatient Prescriptions  Medication Sig Dispense Refill  . aspirin 81 MG EC tablet Take 81 mg by mouth once daily.  Marland Kitchen atorvastatin (LIPITOR) 10 MG tablet Take 1 tablet (10 mg total) by mouth  nightly. 90 tablet 3  . fluticasone-salmeterol (ADVAIR HFA) 115-21 mcg/actuation inhaler Inhale 2 inhalations into the lungs every 12 (twelve) hours. 1 Inhaler 12  . VENTOLIN HFA 90 mcg/actuation inhaler   No current facility-administered medications for this visit.   Allergies: Patient has no known allergies.  Social and Family History  Social History reports that she has been smoking Cigarettes. She has a 40.00 pack-year smoking history. She has never used smokeless tobacco. She reports that she does not drink alcohol or use drugs.  Family History Family History  Problem Relation Age of Onset  . High blood pressure (Hypertension) Mother  . Myocardial Infarction (Heart attack) Mother  . Stroke Mother  . Colon polyps Sister   Review of Systems  Review of Systems  Constitutional: Negative for chills, diaphoresis, fever, malaise/fatigue and weight loss.  HENT: Negative for congestion, ear discharge, hearing loss and tinnitus.  Eyes: Negative for blurred vision.  Respiratory: Positive for shortness of breath. Negative for cough, hemoptysis, sputum production and wheezing.  Cardiovascular: Negative for chest pain, palpitations, orthopnea, claudication, leg swelling and PND.  Gastrointestinal: Negative for abdominal pain, blood in stool, constipation, diarrhea, heartburn, melena, nausea and vomiting.  Genitourinary: Negative for dysuria, frequency, hematuria and urgency.  Musculoskeletal: Negative for back pain, falls, joint pain and myalgias.  Skin: Negative for itching and rash.  Neurological: Negative for dizziness, tingling, focal weakness, loss of consciousness, weakness and headaches.  Endo/Heme/Allergies: Negative for polydipsia. Does not bruise/bleed easily.  Psychiatric/Behavioral: Negative for depression, memory loss and substance abuse. The patient is not nervous/anxious.   Physical Examination   Vitals:BP 102/60  Resp 12  Ht 170.2  cm (5\' 7" )  Wt 64 kg (141 lb)  BMI  22.08 kg/m  Ht:170.2 cm (5\' 7" ) Wt:64 kg (141 lb) MPN:TIRW surface area is 1.74 meters squared. Body mass index is 22.08 kg/m.  Wt Readings from Last 3 Encounters:  11/03/16 64 kg (141 lb)  04/07/16 69.9 kg (154 lb)  10/08/15 70.5 kg (155 lb 6.4 oz)   BP Readings from Last 3 Encounters:  11/03/16 102/60  04/07/16 120/80  10/08/15 130/70   General appearance appears in no acute distress  Head Mouth and Eye exam Normocephalic, without obvious abnormality, atraumatic Dentition is good Eyes appear anicteric   Neck exam Thyroid: normal  Nodes: no obvious adenopathy  LUNGS Breath Sounds: Normal Percussion: Normal  CARDIOVASCULAR JVP CV wave: no HJR: no Elevation at 90 degrees: None Carotid Pulse: normal pulsation bilaterally Bruit: None Apex: apical impulse normal  Auscultation Rhythm: normal sinus rhythm S1: normal S2: normal Clicks: no Rub: no Murmurs: 1/6 medium pitched mid systolic blowing at lower left sternal border  Gallop: None ABDOMEN Liver enlargement: no Pulsatile aorta: no Ascites: no Bruits: yes  EXTREMITIES Clubbing: no Edema: trace to 1+ bilateral pedal edema Pulses: peripheral pulses symmetrical and pedal pulses decreased or absent bilateral Femoral Bruits: yes Amputation: no SKIN Rash: no Cyanosis: no Embolic phemonenon: no Bruising: no NEURO Alert and Oriented to person, place and time: yes Non focal: yes  PSYCH: Pt appears to have normal affect  LABS REVIEWED Last 3 CBC results: Lab Results  Component Value Date  WBC 7.0 03/04/2012   Lab Results  Component Value Date  HGB 15.0 03/04/2012   Lab Results  Component Value Date  HCT 0.46 (H) 03/04/2012   Lab Results  Component Value Date  PLT 290 03/04/2012   Lab Results  Component Value Date  CREATININE 0.7 04/07/2016  BUN 12 04/07/2016  NA 139 04/07/2016  K 4.6 04/07/2016  CL 104 04/07/2016  CO2 28 04/07/2016   Lab Results  Component Value Date  HGBA1C  5.5 03/18/2014   Lab Results  Component Value Date  HDL 50 04/02/2015  HDL 54 03/18/2014  HDL 52 12/26/2012   Lab Results  Component Value Date  LDLCALC 106 04/02/2015  LDLCALC 98 03/18/2014  LDLCALC 102 12/26/2012   Lab Results  Component Value Date  TRIG 153 04/02/2015  TRIG 143 03/18/2014  TRIG 116 12/26/2012   Lab Results  Component Value Date  ALT 22 04/07/2016  AST 19 04/07/2016  ALKPHOS 94 04/07/2016   Diagnostic Studies Reviewed:  EKG EKG demonstrated normal sinus rhythm, nonspecific ST and T waves changes.  Assessment and Plan   72 y.o. female with  ICD-10-CM ICD-9-CM  1. Pre-op evaluation-patient with some dyspnea on exertion. She is a smoker. She does have an abdominal aortic aneurysm. Will need to risk stratify prior to procedure regarding her aneurysm. Will proceed with a Lexiscan sestamibi secondary to inability ambulate to evaluate her abdominal aortic aneurysm. Further recommendations after this is complete. Z01.818 V72.84 ECG 12-lead  NM myocardial perfusion SPECT multiple (stress and rest)  ECG stress test only  2. Dyspnea on effort-as per above R06.09 786.09 NM myocardial perfusion SPECT multiple (stress and rest)  ECG stress test only  3. Abdominal aortic aneurysm (AAA) without rupture (CMS-HCC)-5 cm. Being followed by vascular surgery and is contemplating undergoing surgical versus percutaneous intervention I71.4 441.4  4. Smoking 1/2 pack a day or less-smoking cessation is recommended. F17.210 305.1   Return if symptoms worsen or fail to improve.  These  notes generated with voice recognition software. I apologize for typographical errors.  Sydnee Levans, MD       Plan of Treatment - as of this encounter  Upcoming Encounters Upcoming Encounters  Date Type Specialty Care Team Description  11/20/2016 Office Visit Family Medicine Dimas Chyle, MD  [address]  [place], [place] [address]  [phone]  [phone]  (Fax)  Pure hypercholesterolemia (Primary Dx);  Hx of breast cancer- left 2002  04/10/2017 Office Visit Family Medicine Denyse Amass, [place] [address]  [place], [place] [address]  [phone]  [phone] (Fax)     Imaging Results - in this encounter   NM myocardial perfusion SPECT multiple (stress and rest) (11/06/2016 1:45 PM) NM myocardial perfusion SPECT multiple (stress and rest) (11/06/2016 1:45 PM)  Impressions    Negative Lexiscan stress.LV function normal.No reversible ischemia   noted.    NM myocardial perfusion SPECT multiple (stress and rest) (11/06/2016 1:45 PM)  Narrative  CARDIOLOGY [place]  [facility]  [place], [id number]  [phone]    Procedure: Pharmacologic Myocardial Perfusion Imaging  ONE day procedure    Indication: Pre-op evaluation  Plan: NM myocardial perfusion SPECT multiple (stress   and rest), ECG stress test only    Dyspnea on effort  Plan: NM myocardial perfusion SPECT multiple (stress   and rest), ECG stress test only    Ordering Physician:     Dr. Bartholome Bill      Clinical History:  72 y.o. year old female  Vitals: Height: 15 in Weight: 141 lb  Cardiac risk factors include:    PAD, Smoking and Hyperlipidemia       Procedure:    Pharmacologic stress testing was performed with Regadenoson using a single   use 0.4mg /96ml (0.08 mg/ml) prefilled syringe intravenously infused as a   bolus dose. The stress test was stopped due to Infusion completion.Blood   pressure response was normal. The patient did not develop any symptoms   other than fatigue during the procedure.     Rest HR: 70bpm  Rest BP: 130/13mmHg  Max HR: 89bpm  Min BP: 110/78mmHg    Stress Test Administered by: Oswald Hillock, CMA    ECG Interpretation:  Rest CVE:LFYBOF sinus rhythm, none  Stress BPZ:WCHENI  sinus rhythm, no arrhythmia or ischemia  Recovery DPO:EUMPNT sinus rhythm  ECG Interpretation:non-diagnostic due to pharmacologic testing.      Administrations This Visit  regadenoson (LEXISCAN) 0.4 mg/5 mL inj syringe 0.4 mg  Admin Date  11/06/2016 Action  Given Dose  0.4 mg Route  Intravenous Administered By  Ane Payment, CNMT       technetium Tc71m sestamibi (CARDIOLITE) injection 61.44 millicurie  Admin Date  11/06/2016 Action  Given Dose  31.54 millicurie Route  Intravenous Administered By  Ane Payment, CNMT       technetium Tc60m sestamibi (CARDIOLITE) injection 00.86 millicurie  Admin Date  11/06/2016 Action  Given Dose  76.19 millicurie Route  Intravenous Administered By  Ane Payment, CNMT             Gated post-stress perfusion imaging was performed 30 minutes after stress.   Rest images were performed 30 minutes after injection.    Gated LV Analysis:   TID Ratio: 1.03    LVEF= 60%    FINDINGS:  Regional wall motion:reveals normal myocardial thickening and wall   motion.  The overall quality of the study is excellent.  Artifacts noted: no  Left ventricular cavity: normal.    Perfusion Analysis:SPECT images demonstrate homogeneous tracer   distribution throughout the myocardium.       ECG Results - in this encounter  Table of Contents for ECG Results  ECG STRESS TEST ONLY (11/06/2016 9:50 AM)  ECG 12-lead (11/03/2016 9:53 AM)     ECG STRESS TEST ONLY (11/06/2016 9:50 AM) ECG STRESS TEST ONLY (11/06/2016 9:50 AM)  Specimen Performing Laboratory   [facility] GE MUSE   Back to top of ECG Results    ECG 12-lead (11/03/2016 9:53 AM) ECG 12-lead (11/03/2016 9:53 AM)  Component Value Ref Range  Vent Rate (bpm) 71   PR Interval (msec) 108   QRS Interval (msec) 88   QT Interval (msec) 404   QTc (msec) 439    ECG 12-lead (11/03/2016 9:53 AM)  Specimen Performing  Laboratory   [facility] GE M[facility] RESULTS    ECG 12-lead (11/03/2016 9:53 AM)  Narrative  Sinus rhythm with short PR      No previous ECGs available  I reviewed and concur with this report. Electronically signed ZJ:QBHA MD, KEN [phone]) on 11/06/2016 3:49:00 PM   Back to top of ECG Results   Visit Diagnoses   Diagnosis  Pre-op evaluation - Primary  Dyspnea on effort  Other dyspnea and respiratory abnormality   Abdominal aortic aneurysm (AAA) without rupture (CMS-HCC)  Smoking 1/2 pack a day or less   Discontinued Medications - as of this encounter  Prescription Sig. Discontinue Reason Start Date End Date  UNABLE TO FIND  Indications: Need for vaccination tdap IM x 1  04/02/2015 11/03/2016  polyethylene glycol (MIRALAX) powder  Indications: Special screening for malignant neoplasms, colon, Family history of malignant neoplasm of gastrointestinal tract Take as directed for colonoscopy prep.  04/20/2014 11/03/2016  albuterol (PROVENTIL) 2.5 mg /3 mL (0.083 %) nebulizer solution  Indications: Chronic obstructive pulmonary disease with acute exacerbation (CMS-HCC) Take 3 mLs (2.5 mg total) by nebulization every 4 (four) hours as needed for Wheezing.  04/07/2016 11/03/2016  calcium carbonate-vitamin D3 (CALTRATE 600+D) 600 mg(1,500mg ) -400 unit tablet  Take 1 tablet by mouth 2 (two) times daily with meals.   11/03/2016   Historical Medications - added in this encounter  This list may reflect changes made after this encounter.  Medication Sig. Disp. Refills Start Date End Date  aspirin 81 MG EC tablet  Take 81 mg by mouth once daily.       Images Patient Contacts   Contact Name Contact Address Communication Relationship to Patient  [place],Phillip Unknown [phone] [facility]) Son or Daughter, Emergency Contact   Document Information  Primary Care Provider Other Service Providers Document Coverage Dates  Denyse Amass MD (Jul. 12, 2013July 12,  2013 - Present) [phone] (Work) [phone] (Fax) [place], [place] [address]   Jun. 08, 2018June 08, 2018   [place], [place] [address]   Encounter Providers Encounter Date  Sydnee Levans MD (Attending) [phone] (Work) [phone] (Fax) [place] [address] [place], [place] [address]  Jun. 08, 2018June 08, 2018

## 2017-12-04 NOTE — Patient Instructions (Signed)
Your procedure is scheduled on: 12-05-17  Report to Same Day Surgery 2nd floor medical mall [facility] Entrance-take elevator on left to 2nd floor.  Check in with surgery information desk.) To find out your arrival time please call [phone] between 1PM - 3PM on 12-04-17  Remember: Instructions that are not followed completely may result in serious medical risk, up to and including death, or upon the discretion of your surgeon and anesthesiologist your surgery may need to be rescheduled.    _x___ 1. Do not eat food after midnight the night before your procedure. NO GUM OR CANDY AFTER MIDNIGHT.  You may drink clear liquids up to 2 hours before you are scheduled to arrive at the hospital for your procedure.  Do not drink clear liquids within 2 hours of your scheduled arrival to the hospital.  Clear liquids include  --Water or Apple juice without pulp  --Clear carbohydrate beverage such as ClearFast or Gatorade  --Black Coffee or Clear Tea (No milk, no creamers, do not add anything to the coffee or Tea    __x__ 2. No Alcohol for 24 hours before or after surgery.   __x__3. No Smoking or e-cigarettes for 24 prior to surgery.  Do not use any chewable tobacco products for at least 6 hour prior to surgery   ____  4. Bring all medications with you on the day of surgery if instructed.    __x__ 5. Notify your doctor if there is any change in your medical condition     (cold, fever, infections).    x___6. On the morning of surgery brush your teeth with toothpaste and water.  You may rinse your mouth with mouth wash if you wish.  Do not swallow any toothpaste or mouthwash.   Do not wear jewelry, make-up, hairpins, clips or nail polish.  Do not wear lotions, powders, or perfumes. You may wear deodorant.  Do not shave 48 hours prior to surgery. Men may shave face and neck.  Do not bring valuables to the hospital.    [facility] is not responsible for any belongings or valuables.    Contacts, dentures or bridgework may not be worn into surgery.  Leave your suitcase in the car. After surgery it may be brought to your room.  For patients admitted to the hospital, discharge time is determined by your treatment team.  _  Patients discharged the day of surgery will not be allowed to drive home.  You will need someone to drive you home and stay with you the night of your procedure.    Please read over the following fact sheets that you were given:   [facility] Preparing for Surgery and or MRSA Information   _x___ TAKE THE FOLLOWING MEDICATION THE MORNING OF SURGERY WITH A SMALL SIP OF WATER. These include:  1. PREDNISONE  2.  3.  4.  5.  6.  ____Fleets enema or Magnesium Citrate as directed.   ____ Use CHG Soap or sage wipes as directed on instruction sheet   ____ Use inhalers on the day of surgery and bring to hospital day of surgery  ____ Stop Metformin and Janumet 2 days prior to surgery.    ____ Take 1/2 of usual insulin dose the night before surgery and none on the morning surgery.   _x___ Follow recommendations from Cardiologist, Pulmonologist or PCP regarding stopping Aspirin, Coumadin, Plavix ,Eliquis, Effient, or Pradaxa, and Pletal-PT STOPPED ASA LAST Tuesday PER DR JUENGELS ORDER PER TP  X____Stop Anti-inflammatories  such as Advil, Aleve, Ibuprofen, Motrin, Naproxen, Naprosyn, Goodies powders or aspirin products NOW-OK to take Tylenol    ____ Stop supplements until after surgery.     ____ Bring C-Pap to the hospital.

## 2017-12-04 NOTE — Pre-Procedure Instructions (Signed)
NM myocardial perfusion SPECT multiple (stress and rest)11/06/2016 [place] Result Impression   Negative Lexiscan stress.LV function normal.No reversible ischemia  noted.  Result Narrative  CARDIOLOGY DEPARTMENT [facility] [address] [place], [id number] [phone]  Procedure: Pharmacologic Myocardial Perfusion Imaging ONE day procedure  Indication: Pre-op evaluation Plan: NM myocardial perfusion SPECT multiple (stress  and rest), ECG stress test only  Dyspnea on effort Plan: NM myocardial perfusion SPECT multiple (stress  and rest), ECG stress test only  Ordering Physician:   Dr. Bartholome Bill   Clinical History: 72 y.o. year old female Vitals: Height: 63 in Weight: 141 lb Cardiac risk factors include:  PAD, Smoking and Hyperlipidemia    Procedure:  Pharmacologic stress testing was performed with Regadenoson using a single  use 0.4mg /25ml (0.08 mg/ml) prefilled syringe intravenously infused as a  bolus dose. The stress test was stopped due to Infusion completion.Blood  pressure response was normal. The patient did not develop any symptoms  other than fatigue during the procedure.   Rest HR: 70bpm Rest BP: 130/54mmHg Max HR: 89bpm Min BP: 110/62mmHg  Stress Test Administered by: Oswald Hillock, CMA  ECG Interpretation: Rest JJK:KXFGHW sinus rhythm, none Stress EXH:BZJIRC sinus rhythm, no arrhythmia or ischemia Recovery VEL:FYBOFB sinus rhythm ECG Interpretation:non-diagnostic due to pharmacologic testing.   Administrations This Visit  regadenoson (LEXISCAN) 0.4 mg/5 mL inj syringe 0.4 mg  Admin Date 11/06/2016 Action Given Dose 0.4 mg Route Intravenous Administered By Ane Payment, CNMT     technetium Tc44m sestamibi (CARDIOLITE) injection 51.02 millicurie  Admin Date 58/52/7782 Action Given Dose 42.35 millicurie Route Intravenous Administered  By Ane Payment, CNMT     technetium Tc67m sestamibi (CARDIOLITE) injection 36.14 millicurie  Admin Date 43/15/4008 Action Given Dose 67.61 millicurie Route Intravenous Administered By Ane Payment, CNMT       Gated post-stress perfusion imaging was performed 30 minutes after stress.  Rest images were performed 30 minutes after injection.  Gated LV Analysis:  TID Ratio: 1.03  LVEF= 60%  FINDINGS: Regional wall motion:reveals normal myocardial thickening and wall  motion. The overall quality of the study is excellent. Artifacts noted: no Left ventricular cavity: normal.  Perfusion Analysis:SPECT images demonstrate homogeneous tracer  distribution throughout the myocardium.  Status Results Details   Unavailable

## 2017-12-05 ENCOUNTER — Ambulatory Visit: Payer: Medicare Other | Admitting: Certified Registered Nurse Anesthetist

## 2017-12-05 ENCOUNTER — Encounter
Admission: RE | Disposition: A | Discharge status: Home / Self Care | Payer: Self-pay | Source: Ambulatory Visit | Attending: Otolaryngology

## 2017-12-05 ENCOUNTER — Ambulatory Visit
Admission: RE | Admit: 2017-12-05 | Discharge: 2017-12-05 | Disposition: A | Discharge status: Home / Self Care | Payer: Medicare Other | Source: Ambulatory Visit | Attending: Otolaryngology | Admitting: Otolaryngology

## 2017-12-05 ENCOUNTER — Other Ambulatory Visit: Payer: Self-pay

## 2017-12-05 DIAGNOSIS — R51 Headache: Secondary | ICD-10-CM | POA: Insufficient documentation

## 2017-12-05 DIAGNOSIS — Z7982 Long term (current) use of aspirin: Secondary | ICD-10-CM | POA: Insufficient documentation

## 2017-12-05 DIAGNOSIS — Z7952 Long term (current) use of systemic steroids: Secondary | ICD-10-CM | POA: Insufficient documentation

## 2017-12-05 DIAGNOSIS — Z7951 Long term (current) use of inhaled steroids: Secondary | ICD-10-CM | POA: Insufficient documentation

## 2017-12-05 DIAGNOSIS — H6123 Impacted cerumen, bilateral: Secondary | ICD-10-CM | POA: Insufficient documentation

## 2017-12-05 DIAGNOSIS — I739 Peripheral vascular disease, unspecified: Secondary | ICD-10-CM | POA: Diagnosis not present

## 2017-12-05 DIAGNOSIS — Z85118 Personal history of other malignant neoplasm of bronchus and lung: Secondary | ICD-10-CM | POA: Diagnosis not present

## 2017-12-05 DIAGNOSIS — I771 Stricture of artery: Secondary | ICD-10-CM | POA: Insufficient documentation

## 2017-12-05 DIAGNOSIS — Z79899 Other long term (current) drug therapy: Secondary | ICD-10-CM | POA: Diagnosis not present

## 2017-12-05 DIAGNOSIS — F17210 Nicotine dependence, cigarettes, uncomplicated: Secondary | ICD-10-CM | POA: Insufficient documentation

## 2017-12-05 DIAGNOSIS — J329 Chronic sinusitis, unspecified: Secondary | ICD-10-CM | POA: Insufficient documentation

## 2017-12-05 DIAGNOSIS — J449 Chronic obstructive pulmonary disease, unspecified: Secondary | ICD-10-CM | POA: Insufficient documentation

## 2017-12-05 DIAGNOSIS — Z853 Personal history of malignant neoplasm of breast: Secondary | ICD-10-CM | POA: Diagnosis not present

## 2017-12-05 HISTORY — PX: ARTERY BIOPSY: SHX891

## 2017-12-05 SURGERY — BIOPSY TEMPORAL ARTERY
Anesthesia: General | Laterality: Right | Wound class: Clean

## 2017-12-05 MED ORDER — FAMOTIDINE 20 MG PO TABS
20.0000 mg | ORAL_TABLET | Freq: Once | ORAL | Status: AC
  Administered 2017-12-05: 20 mg via ORAL

## 2017-12-05 MED ORDER — HYDROCODONE-ACETAMINOPHEN 7.5-325 MG PO TABS
1.0000 | ORAL_TABLET | Freq: Once | ORAL | Status: DC | PRN
  Filled 2017-12-05: qty 1

## 2017-12-05 MED ORDER — PROPOFOL 10 MG/ML IV BOLUS
INTRAVENOUS | Status: AC
  Filled 2017-12-05: qty 40

## 2017-12-05 MED ORDER — FENTANYL CITRATE (PF) 100 MCG/2ML IJ SOLN
INTRAMUSCULAR | Status: DC | PRN
  Administered 2017-12-05: 25 ug via INTRAVENOUS
  Administered 2017-12-05: 50 ug via INTRAVENOUS
  Administered 2017-12-05: 25 ug via INTRAVENOUS

## 2017-12-05 MED ORDER — LIDOCAINE HCL (PF) 2 % IJ SOLN
INTRAMUSCULAR | Status: AC
  Filled 2017-12-05: qty 20

## 2017-12-05 MED ORDER — LACTATED RINGERS IV SOLN
INTRAVENOUS | Status: DC
  Administered 2017-12-05: 06:00:00 via INTRAVENOUS

## 2017-12-05 MED ORDER — MIDAZOLAM HCL 2 MG/2ML IJ SOLN
INTRAMUSCULAR | Status: AC
  Filled 2017-12-05: qty 2

## 2017-12-05 MED ORDER — LIDOCAINE-EPINEPHRINE 1 %-1:100000 IJ SOLN
INTRAMUSCULAR | Status: DC | PRN
  Administered 2017-12-05: 2 mL

## 2017-12-05 MED ORDER — SUCCINYLCHOLINE CHLORIDE 20 MG/ML IJ SOLN
INTRAMUSCULAR | Status: AC
  Filled 2017-12-05: qty 1

## 2017-12-05 MED ORDER — EPHEDRINE SULFATE 50 MG/ML IJ SOLN
INTRAMUSCULAR | Status: AC
  Filled 2017-12-05: qty 1

## 2017-12-05 MED ORDER — PROPOFOL 500 MG/50ML IV EMUL
INTRAVENOUS | Status: DC | PRN
  Administered 2017-12-05: 25 ug/kg/min via INTRAVENOUS

## 2017-12-05 MED ORDER — GLYCOPYRROLATE 0.2 MG/ML IJ SOLN
INTRAMUSCULAR | Status: AC
  Filled 2017-12-05: qty 1

## 2017-12-05 MED ORDER — ACETAMINOPHEN 325 MG PO TABS: 325.0000 mg | ORAL_TABLET | ORAL | Status: DC | PRN

## 2017-12-05 MED ORDER — PHENYLEPHRINE HCL 10 MG/ML IJ SOLN
INTRAMUSCULAR | Status: DC | PRN
  Administered 2017-12-05: 50 ug via INTRAVENOUS
  Administered 2017-12-05 (×2): 100 ug via INTRAVENOUS
  Administered 2017-12-05 (×3): 50 ug via INTRAVENOUS
  Administered 2017-12-05: 100 ug via INTRAVENOUS

## 2017-12-05 MED ORDER — PROMETHAZINE HCL 25 MG/ML IJ SOLN: 6.2500 mg | INTRAMUSCULAR | Status: DC | PRN

## 2017-12-05 MED ORDER — FENTANYL CITRATE (PF) 100 MCG/2ML IJ SOLN: 25.0000 ug | INTRAMUSCULAR | Status: DC | PRN

## 2017-12-05 MED ORDER — PHENYLEPHRINE HCL 10 MG/ML IJ SOLN
INTRAMUSCULAR | Status: AC
  Filled 2017-12-05: qty 1

## 2017-12-05 MED ORDER — FENTANYL CITRATE (PF) 100 MCG/2ML IJ SOLN
INTRAMUSCULAR | Status: AC
  Filled 2017-12-05: qty 2

## 2017-12-05 MED ORDER — LIDOCAINE-EPINEPHRINE 1 %-1:100000 IJ SOLN
INTRAMUSCULAR | Status: AC
  Filled 2017-12-05: qty 1

## 2017-12-05 MED ORDER — MIDAZOLAM HCL 2 MG/2ML IJ SOLN
INTRAMUSCULAR | Status: DC | PRN
  Administered 2017-12-05: 2 mg via INTRAVENOUS

## 2017-12-05 MED ORDER — ACETAMINOPHEN 160 MG/5ML PO SOLN
325.0000 mg | ORAL | Status: DC | PRN
  Filled 2017-12-05: qty 20.3

## 2017-12-05 MED ORDER — FAMOTIDINE 20 MG PO TABS
ORAL_TABLET | ORAL | Status: AC
  Administered 2017-12-05: 20 mg via ORAL
  Filled 2017-12-05: qty 1

## 2017-12-05 SURGICAL SUPPLY — 32 items
BLADE SURG 15 STRL LF DISP TIS (BLADE) ×1 IMPLANT
BLADE SURG 15 STRL SS (BLADE) ×2
CANISTER SUCT 1200ML W/VALVE (MISCELLANEOUS) ×3 IMPLANT
CORD BIP STRL DISP 12FT (MISCELLANEOUS) ×3 IMPLANT
DERMABOND ADVANCED (GAUZE/BANDAGES/DRESSINGS) ×2
DERMABOND ADVANCED .7 DNX12 (GAUZE/BANDAGES/DRESSINGS) ×1 IMPLANT
ELECT CAUTERY BLADE TIP 2.5 (TIP) ×3
ELECT CAUTERY NEEDLE TIP 1.0 (MISCELLANEOUS) ×3
ELECT REM PT RETURN 9FT ADLT (ELECTROSURGICAL) ×3
ELECTRODE CAUTERY BLDE TIP 2.5 (TIP) ×1 IMPLANT
ELECTRODE CAUTERY NEDL TIP 1.0 (MISCELLANEOUS) ×1 IMPLANT
ELECTRODE REM PT RTRN 9FT ADLT (ELECTROSURGICAL) ×1 IMPLANT
FORCEPS JEWEL BIP 4-3/4 STR (INSTRUMENTS) ×3 IMPLANT
GLOVE PROTEXIS LATEX SZ 7.5 (GLOVE) ×6 IMPLANT
GOWN STRL REUS W/ TWL LRG LVL3 (GOWN DISPOSABLE) ×2 IMPLANT
GOWN STRL REUS W/TWL LRG LVL3 (GOWN DISPOSABLE) ×4
KIT TURNOVER KIT A (KITS) ×3 IMPLANT
LABEL OR SOLS (LABEL) ×3 IMPLANT
NEEDLE HYPO 25GX1X1/2 BEV (NEEDLE) ×3 IMPLANT
NS IRRIG 500ML POUR BTL (IV SOLUTION) ×3 IMPLANT
PACK HEAD/NECK (MISCELLANEOUS) ×3 IMPLANT
PENCIL ELECTRO HAND CTR (MISCELLANEOUS) IMPLANT
SPONGE KITTNER 5P (MISCELLANEOUS) IMPLANT
SPONGE XRAY 4X4 16PLY STRL (MISCELLANEOUS) IMPLANT
SUCTION FRAZIER HANDLE 10FR (MISCELLANEOUS) ×2
SUCTION TUBE FRAZIER 10FR DISP (MISCELLANEOUS) ×1 IMPLANT
SUT PLAIN GUT (SUTURE) ×3 IMPLANT
SUT SILK 2 0 (SUTURE) ×2
SUT SILK 2-0 18XBRD TIE 12 (SUTURE) ×1 IMPLANT
SUT VIC AB 5 0 P2 18 (SUTURE) ×3 IMPLANT
SUT VICRYL+ 4-0 18IN PS-4 (SUTURE) ×3 IMPLANT
SYR 3ML LL SCALE MARK (SYRINGE) ×3 IMPLANT

## 2017-12-05 NOTE — Anesthesia Preprocedure Evaluation (Addendum)
Anesthesia Evaluation  Patient identified by MRN, date of birth, ID band Patient awake    Reviewed: Allergy & Precautions, H&P , NPO status , reviewed documented beta blocker date and time   Airway Mallampati: II  TM Distance: >3 FB     Dental  (+) Edentulous Upper, Missing, Chipped, Poor Dentition   Pulmonary shortness of breath, COPD, Current Smoker,    Pulmonary exam normal        Cardiovascular + Peripheral Vascular Disease  Normal cardiovascular exam     Neuro/Psych  Headaches,    GI/Hepatic GERD  Controlled,  Endo/Other    Renal/GU      Musculoskeletal   Abdominal   Peds  Hematology  (+) anemia ,   Anesthesia Other Findings Past Medical History: No date: AAA (abdominal aortic aneurysm) (HCC) No date: Anemia 2002: Breast cancer ([place])     Comment:  left No date: COPD (chronic obstructive pulmonary disease) (HCC) No date: Dyspnea No date: Headache No date: High cholesterol No date: Personal history of chemotherapy No date: Small cell lung cancer (HCC) No date: Tobacco abuse  Past Surgical History: No date: ABDOMINAL HYSTERECTOMY 02/19/2017: ENDOBRONCHIAL ULTRASOUND; N/A     Comment:  Procedure: ENDOBRONCHIAL ULTRASOUND;  Surgeon: Flora Lipps, MD;  Location: [facility] ORS;  Service:               Cardiopulmonary;  Laterality: N/A; 12/06/2016: ENDOVASCULAR REPAIR/STENT GRAFT; N/A     Comment:  Procedure: Endovascular Repair/Stent Graft;  Surgeon:               Katha Cabal, MD;  Location: [facility];               Service: Cardiovascular;  Laterality: N/A; 03/02/2017: IR FLUORO GUIDE PORT INSERTION RIGHT 2002: MASTECTOMY; Left     Comment:  with sentinel node   BMI    Body Mass Index:  17.29 kg/m      Reproductive/Obstetrics                             Anesthesia Physical Anesthesia Plan  ASA: IV  Anesthesia Plan: General   Post-op Pain  Management:    Induction:   PONV Risk Score and Plan: 2 and Treatment may vary due to age or medical condition, TIVA, Ondansetron and Midazolam  Airway Management Planned:   Additional Equipment:   Intra-op Plan:   Post-operative Plan:   Informed Consent: I have reviewed the patients History and Physical, chart, labs and discussed the procedure including the risks, benefits and alternatives for the proposed anesthesia with the patient or authorized representative who has indicated his/her understanding and acceptance.   Dental Advisory Given  Plan Discussed with: CRNA  Anesthesia Plan Comments:         Anesthesia Quick Evaluation

## 2017-12-05 NOTE — Transfer of Care (Signed)
Immediate Anesthesia Transfer of Care Note  Patient: Roberta [name]  Procedure(s) Performed: BIOPSY TEMPORAL ARTERY (Right )  Patient Location: PACU  Anesthesia Type:General  Level of Consciousness: awake, alert  and oriented  Airway & Oxygen Therapy: Patient Spontanous Breathing and Patient connected to face mask oxygen  Post-op Assessment: Report given to RN and Post -op Vital signs reviewed and stable  Post vital signs: Reviewed and stable  Last Vitals:  Vitals Value Taken Time  BP 154/70 12/05/2017  8:26 AM  Temp    Pulse 96 12/05/2017  8:26 AM  Resp 24 12/05/2017  8:26 AM  SpO2 99 % 12/05/2017  8:26 AM  Vitals shown include unvalidated device data.  Last Pain:  Vitals:   12/05/17 0607  TempSrc: Temporal  PainSc: 5          Complications: No apparent anesthesia complications

## 2017-12-05 NOTE — Op Note (Signed)
12/05/2017  8:20 AM    Roberta [name]  [id number]   Pre-Op Dx: Right temporal arteritis  Post-op Dx: Same  Proc: Right temporal artery biopsy  Surg:  Huey Romans  Anes:   Local anesthesia with IV sedation and monitored anesthesia care  EBL: Minimal  Comp: None  Findings: Very small tortuous artery in the right temporal area   Procedure: The patient was brought to the operating room and placed in the supine position with her head turned slightly to the left.  The right temple was previously marked where the artery was pulsating.  It was cleaned with alcohol around here and then local anesthesia using 3 mL of 1% Xylocaine with epi 1:100,000 was used for infiltration of the skin and subcu around the artery in the right temporal area.  She was then prepped and draped in a sterile fashion.  The small horizontal incision was made in the right temporal area overlying the right temporal artery.  This was carried through skin and into the subcu.  This was bluntly dissected open until the artery was clearly identified.  The artery was tracked for about an inch.  It was very tortuous anteriorly.  It was then clamped inferiorly and clamped superiorly and the artery was removed.  The artery was tied off at the clamps with a 4-0 silk tie.  Bipolar electrocautery was used to help control any minor bleeding sites.  Wound was cleaned and then 4-0 Vicryl was used for closing the sub-dermal area.  This held the skin edges in apposition with no evidence of tension on the wound.  Dermabond was then placed over the skin for closing the wound.  The patient tolerated the procedure well.  Was awakened and taken to the recovery room in satisfactory condition.  There were no operative complications.  Dispo:   To PACU to be discharged home  Plan: To follow-up in the office in 5 days for wound check and to go over the path report.  [name]  12/05/2017 8:20 AM

## 2017-12-05 NOTE — H&P (Signed)
H&P has been reviewedand patient reevaluated,  and no changes necessary. To be downloaded later.  

## 2017-12-05 NOTE — Anesthesia Post-op Follow-up Note (Signed)
Anesthesia QCDR form completed.        

## 2017-12-05 NOTE — Anesthesia Postprocedure Evaluation (Signed)
Anesthesia Post Note  Patient: Roberta [name]  Procedure(s) Performed: BIOPSY TEMPORAL ARTERY (Right )  Patient location during evaluation: PACU Anesthesia Type: General Level of consciousness: awake and alert Pain management: pain level controlled Vital Signs Assessment: post-procedure vital signs reviewed and stable Respiratory status: spontaneous breathing, nonlabored ventilation, respiratory function stable and patient connected to nasal cannula oxygen Cardiovascular status: blood pressure returned to baseline and stable Postop Assessment: no apparent nausea or vomiting Anesthetic complications: no     Last Vitals:  Vitals:   12/05/17 0901 12/05/17 0923  BP: (!) 147/75 (!) 141/80  Pulse: (!) 57 93  Resp: 16   Temp: (!) 36.3 C   SpO2: 95% 94%    Last Pain:  Vitals:   12/05/17 0923  TempSrc:   PainSc: 0-No pain                 Alphonsus Sias

## 2017-12-05 NOTE — OR Nursing (Signed)
Dr Lavone Neri and Dr Kathyrn Sheriff asked to review CT of chest from 11/27/17

## 2017-12-06 ENCOUNTER — Encounter: Payer: Self-pay | Admitting: Otolaryngology

## 2017-12-06 LAB — SURGICAL PATHOLOGY

## 2018-01-10 ENCOUNTER — Ambulatory Visit
Admission: RE | Admit: 2018-01-10 | Discharge: 2018-01-10 | Disposition: A | Discharge status: Home / Self Care | Payer: Medicare Other | Source: Ambulatory Visit | Attending: Radiation Oncology | Admitting: Radiation Oncology

## 2018-01-10 ENCOUNTER — Other Ambulatory Visit: Payer: Self-pay

## 2018-01-10 ENCOUNTER — Inpatient Hospital Stay: Payer: Medicare Other | Attending: Radiation Oncology

## 2018-01-10 ENCOUNTER — Encounter: Payer: Self-pay | Admitting: Radiation Oncology

## 2018-01-10 DIAGNOSIS — F17210 Nicotine dependence, cigarettes, uncomplicated: Secondary | ICD-10-CM | POA: Diagnosis not present

## 2018-01-10 DIAGNOSIS — Z452 Encounter for adjustment and management of vascular access device: Secondary | ICD-10-CM | POA: Diagnosis not present

## 2018-01-10 DIAGNOSIS — C3411 Malignant neoplasm of upper lobe, right bronchus or lung: Secondary | ICD-10-CM | POA: Insufficient documentation

## 2018-01-10 DIAGNOSIS — Z923 Personal history of irradiation: Secondary | ICD-10-CM | POA: Diagnosis not present

## 2018-01-10 DIAGNOSIS — Z95828 Presence of other vascular implants and grafts: Secondary | ICD-10-CM

## 2018-01-10 DIAGNOSIS — C3401 Malignant neoplasm of right main bronchus: Secondary | ICD-10-CM | POA: Insufficient documentation

## 2018-01-10 MED ORDER — HEPARIN SOD (PORK) LOCK FLUSH 100 UNIT/ML IV SOLN
500.0000 [IU] | Freq: Once | INTRAVENOUS | Status: AC
  Administered 2018-01-10: 500 [IU] via INTRAVENOUS

## 2018-01-10 MED ORDER — SODIUM CHLORIDE 0.9% FLUSH
10.0000 mL | INTRAVENOUS | Status: AC | PRN
  Administered 2018-01-10: 10 mL via INTRAVENOUS
  Filled 2018-01-10: qty 10

## 2018-01-10 NOTE — Progress Notes (Signed)
Radiation Oncology Follow up Note  Name: Roberta [name]   Date:   01/10/2018 MRN:  [id number] DOB: Sep 30, 1945    This 72 y.o. 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  REFERRING PROVIDER: Gayland Curry, MD  HPI: patient is a 71 year old female now out 5 months having completed PCI after concurrent chemoradiation for limited stage small cell lung cancer. She seen today in routine follow-up is doing fairly well. She started to gain some weight. She specifically denies cough hemoptysis or chest tightness. She's had no change in her neurologic status or apparent cognitive function..CT scan of her chest back in July showed a complex appearance of the lung with some new airspace opacities although nothing to suggest recurrent or proved progressive disease. Brain imaging also was negative for malignancy.  COMPLICATIONS OF TREATMENT: none  FOLLOW UP COMPLIANCE: keeps appointments   PHYSICAL EXAM:  BP (P) 98/61 (BP Location: Left Arm, Patient Position: Sitting)   Pulse (!) (P) 101   Temp (!) (P) 96.9 F (36.1 C) (Oral)   Resp (P) 18   Wt (P) 111 lb 10.6 oz (50.7 kg)   BMI (P) 17.49 kg/m  Well-developed well-nourished patient in NAD. HEENT reveals PERLA, EOMI, discs not visualized.  Oral cavity is clear. No oral mucosal lesions are identified. Neck is clear without evidence of cervical or supraclavicular adenopathy. Lungs are clear to A&P. Cardiac examination is essentially unremarkable with regular rate and rhythm without murmur rub or thrill. Abdomen is benign with no organomegaly or masses noted. Motor sensory and DTR levels are equal and symmetric in the upper and lower extremities. Cranial nerves II through XII are grossly intact. Proprioception is intact. No peripheral adenopathy or edema is identified. No motor or sensory levels are noted. Crude visual  fields are within normal range.  RADIOLOGY RESULTS: CT scans and MRI scan of the brain both reviewed and compatible with the above-stated findings  PLAN: present time she is doing well with no evidence of disease. She slowly recovering from her concurrent treatment. I'm overall pleased with her progress. I've asked to see her back in 6 months for follow-up. She or he has scheduled follow-up CT scans. To call with any concerns. She is being followed closely by medical oncology.  I would like to take this opportunity to thank you for allowing me to participate in the care of your patient.Noreene Filbert, MD

## 2018-02-11 ENCOUNTER — Other Ambulatory Visit: Payer: Self-pay | Admitting: Oncology

## 2018-02-11 DIAGNOSIS — C3490 Malignant neoplasm of unspecified part of unspecified bronchus or lung: Secondary | ICD-10-CM

## 2018-02-12 ENCOUNTER — Ambulatory Visit
Admission: RE | Admit: 2018-02-12 | Discharge: 2018-02-12 | Disposition: A | Discharge status: Home / Self Care | Payer: Medicare Other | Source: Ambulatory Visit | Attending: Oncology | Admitting: Oncology

## 2018-02-12 DIAGNOSIS — J0100 Acute maxillary sinusitis, unspecified: Secondary | ICD-10-CM | POA: Insufficient documentation

## 2018-02-12 DIAGNOSIS — C3411 Malignant neoplasm of upper lobe, right bronchus or lung: Secondary | ICD-10-CM | POA: Diagnosis not present

## 2018-02-12 DIAGNOSIS — C3490 Malignant neoplasm of unspecified part of unspecified bronchus or lung: Secondary | ICD-10-CM

## 2018-02-12 LAB — CREATININE, SERUM
CREATININE: 1.06 mg/dL — AB (ref 0.44–1.00)
GFR, EST AFRICAN AMERICAN: 60 mL/min — AB (ref >60)
GFR, EST NON AFRICAN AMERICAN: 52 mL/min — AB (ref >60)

## 2018-02-12 MED ORDER — GADOBENATE DIMEGLUMINE 529 MG/ML IV SOLN
10.0000 mL | Freq: Once | INTRAVENOUS | Status: AC | PRN
  Administered 2018-02-12: 10 mL via INTRAVENOUS

## 2018-02-28 ENCOUNTER — Inpatient Hospital Stay: Payer: Medicare Other | Attending: Oncology

## 2018-02-28 ENCOUNTER — Ambulatory Visit
Admission: RE | Admit: 2018-02-28 | Discharge: 2018-02-28 | Disposition: A | Discharge status: Home / Self Care | Payer: Medicare Other | Source: Ambulatory Visit | Attending: Oncology | Admitting: Oncology

## 2018-02-28 DIAGNOSIS — C3411 Malignant neoplasm of upper lobe, right bronchus or lung: Secondary | ICD-10-CM | POA: Insufficient documentation

## 2018-02-28 DIAGNOSIS — Z79899 Other long term (current) drug therapy: Secondary | ICD-10-CM | POA: Diagnosis not present

## 2018-02-28 DIAGNOSIS — Z85118 Personal history of other malignant neoplasm of bronchus and lung: Secondary | ICD-10-CM | POA: Insufficient documentation

## 2018-02-28 DIAGNOSIS — I70203 Unspecified atherosclerosis of native arteries of extremities, bilateral legs: Secondary | ICD-10-CM | POA: Insufficient documentation

## 2018-02-28 DIAGNOSIS — F17210 Nicotine dependence, cigarettes, uncomplicated: Secondary | ICD-10-CM | POA: Insufficient documentation

## 2018-02-28 DIAGNOSIS — J439 Emphysema, unspecified: Secondary | ICD-10-CM | POA: Diagnosis not present

## 2018-02-28 DIAGNOSIS — Z9221 Personal history of antineoplastic chemotherapy: Secondary | ICD-10-CM | POA: Diagnosis not present

## 2018-02-28 DIAGNOSIS — R918 Other nonspecific abnormal finding of lung field: Secondary | ICD-10-CM | POA: Diagnosis not present

## 2018-02-28 DIAGNOSIS — J329 Chronic sinusitis, unspecified: Secondary | ICD-10-CM | POA: Insufficient documentation

## 2018-02-28 DIAGNOSIS — Z923 Personal history of irradiation: Secondary | ICD-10-CM | POA: Diagnosis not present

## 2018-02-28 DIAGNOSIS — I700 Atherosclerosis of aorta: Secondary | ICD-10-CM | POA: Insufficient documentation

## 2018-02-28 DIAGNOSIS — Z23 Encounter for immunization: Secondary | ICD-10-CM | POA: Insufficient documentation

## 2018-02-28 DIAGNOSIS — J449 Chronic obstructive pulmonary disease, unspecified: Secondary | ICD-10-CM | POA: Insufficient documentation

## 2018-02-28 LAB — COMPREHENSIVE METABOLIC PANEL
ALBUMIN: 3.3 g/dL — AB (ref 3.5–5.0)
ALK PHOS: 103 U/L (ref 38–126)
ALT: 9 U/L (ref 0–44)
ANION GAP: 10 (ref 5–15)
AST: 22 U/L (ref 15–41)
BILIRUBIN TOTAL: 0.5 mg/dL (ref 0.3–1.2)
BUN: 11 mg/dL (ref 8–23)
CALCIUM: 9.4 mg/dL (ref 8.9–10.3)
CO2: 30 mmol/L (ref 22–32)
CREATININE: 0.94 mg/dL (ref 0.44–1.00)
Chloride: 99 mmol/L (ref 98–111)
GFR calc Af Amer: >60 mL/min (ref >60)
GFR calc non Af Amer: 60 mL/min — ABNORMAL LOW (ref >60)
GLUCOSE: 95 mg/dL (ref 70–99)
Potassium: 4.1 mmol/L (ref 3.5–5.1)
Sodium: 139 mmol/L (ref 135–145)
Total Protein: 7.2 g/dL (ref 6.5–8.1)

## 2018-02-28 LAB — CBC WITH DIFFERENTIAL/PLATELET
BASOS PCT: 1 %
Basophils Absolute: 0.1 10*3/uL (ref 0–0.1)
EOS ABS: 0 10*3/uL (ref 0–0.7)
Eosinophils Relative: 0 %
HEMATOCRIT: 37.8 % (ref 35.0–47.0)
Hemoglobin: 12.5 g/dL (ref 12.0–16.0)
Lymphocytes Relative: 15 %
Lymphs Abs: 1.4 10*3/uL (ref 1.0–3.6)
MCH: 30 pg (ref 26.0–34.0)
MCHC: 33 g/dL (ref 32.0–36.0)
MCV: 91.1 fL (ref 80.0–100.0)
MONO ABS: 0.7 10*3/uL (ref 0.2–0.9)
Monocytes Relative: 7 %
NEUTROS ABS: 7.4 10*3/uL — AB (ref 1.4–6.5)
Neutrophils Relative %: 77 %
Platelets: 450 10*3/uL — ABNORMAL HIGH (ref 150–440)
RBC: 4.15 MIL/uL (ref 3.80–5.20)
RDW: 15.4 % — AB (ref 11.5–14.5)
WBC: 9.6 10*3/uL (ref 3.6–11.0)

## 2018-02-28 MED ORDER — IOHEXOL 300 MG/ML  SOLN
150.0000 mL | Freq: Once | INTRAMUSCULAR | Status: AC | PRN
  Administered 2018-02-28: 100 mL via INTRAVENOUS

## 2018-03-04 ENCOUNTER — Other Ambulatory Visit: Payer: Medicare Other

## 2018-03-04 ENCOUNTER — Inpatient Hospital Stay (HOSPITAL_BASED_OUTPATIENT_CLINIC_OR_DEPARTMENT_OTHER): Payer: Medicare Other | Admitting: Oncology

## 2018-03-04 ENCOUNTER — Encounter: Payer: Self-pay | Admitting: Oncology

## 2018-03-04 ENCOUNTER — Inpatient Hospital Stay: Payer: Medicare Other

## 2018-03-04 VITALS — BP 119/65 | HR 85 | Temp 97.7°F | Resp 18 | Ht 67.0 in | Wt 109.7 lb

## 2018-03-04 DIAGNOSIS — Z85118 Personal history of other malignant neoplasm of bronchus and lung: Secondary | ICD-10-CM

## 2018-03-04 DIAGNOSIS — I70203 Unspecified atherosclerosis of native arteries of extremities, bilateral legs: Secondary | ICD-10-CM | POA: Diagnosis not present

## 2018-03-04 DIAGNOSIS — F17210 Nicotine dependence, cigarettes, uncomplicated: Secondary | ICD-10-CM

## 2018-03-04 DIAGNOSIS — R918 Other nonspecific abnormal finding of lung field: Secondary | ICD-10-CM | POA: Diagnosis not present

## 2018-03-04 DIAGNOSIS — Z923 Personal history of irradiation: Secondary | ICD-10-CM

## 2018-03-04 DIAGNOSIS — Z08 Encounter for follow-up examination after completed treatment for malignant neoplasm: Secondary | ICD-10-CM

## 2018-03-04 DIAGNOSIS — J449 Chronic obstructive pulmonary disease, unspecified: Secondary | ICD-10-CM

## 2018-03-04 DIAGNOSIS — Z9221 Personal history of antineoplastic chemotherapy: Secondary | ICD-10-CM

## 2018-03-04 DIAGNOSIS — Z23 Encounter for immunization: Secondary | ICD-10-CM | POA: Diagnosis not present

## 2018-03-04 MED ORDER — INFLUENZA VAC SPLIT HIGH-DOSE 0.5 ML IM SUSY
0.5000 mL | PREFILLED_SYRINGE | INTRAMUSCULAR | Status: AC
  Administered 2018-03-04: 0.5 mL via INTRAMUSCULAR

## 2018-03-04 NOTE — Patient Instructions (Signed)

## 2018-03-04 NOTE — Progress Notes (Signed)
No new changes noted today 

## 2018-03-04 NOTE — Progress Notes (Signed)
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 Scans after 4 cycles showed good response to treatment. She completed PCI. She remains in remission and under surveillance.She does have a history of breast cancer in 2002 status post left mastectomy followed by adjuvant chemotherapy. This was treated by Dr. Oliva Bustard. She did not receive any adjuvant radiation.  Patient received from current chemoradiation with cisplatin and etoposide between October 2018 in December 2018.  Interval history-patient reports that her headaches are getting better.  She was treated with high-dose prednisone by ENT for possible temporal arteritis.  She is currently off prednisone.  She continues to smoke less than half pack per day.  Energy levels are better and she denies any specific complaints today.  Denies any fever, cough or sputum production.  ECOG PS- 2 Pain scale- 0 Opioid associated constipation- no  Review of systems- Review of Systems  Constitutional: Positive for malaise/fatigue. Negative for chills, fever and weight loss.  HENT: Negative for congestion, ear discharge and nosebleeds.     Eyes: Negative for blurred vision.  Respiratory: Negative for cough, hemoptysis, sputum production, shortness of breath and wheezing.   Cardiovascular: Negative for chest pain, palpitations, orthopnea and claudication.  Gastrointestinal: Negative for abdominal pain, blood in stool, constipation, diarrhea, heartburn, melena, nausea and vomiting.  Genitourinary: Negative for dysuria, flank pain, frequency, hematuria and urgency.  Musculoskeletal: Negative for back pain, joint pain and myalgias.  Skin: Negative for rash.  Neurological: Negative for dizziness, tingling, focal weakness, seizures, weakness and headaches.  Endo/Heme/Allergies: Does not bruise/bleed easily.  Psychiatric/Behavioral: Negative for depression and suicidal ideas. The patient does not have insomnia.      No Known Allergies   Past Medical History:  Diagnosis Date  . AAA (abdominal aortic aneurysm) ([place])   . Anemia   . Breast cancer ([place]) 2002   left  . COPD (chronic obstructive pulmonary disease) ([place])   . Dyspnea   . Headache   . High cholesterol   . Personal history of chemotherapy   . Small cell lung cancer ([place])   . Tobacco abuse      Past Surgical History:  Procedure Laterality Date  . ABDOMINAL HYSTERECTOMY    . ARTERY BIOPSY Right 12/05/2017   Procedure: BIOPSY TEMPORAL ARTERY;  Surgeon: Margaretha Sheffield, MD;  Location: [facility] ORS;  Service: ENT;  Laterality: Right;  . ENDOBRONCHIAL ULTRASOUND N/A 02/19/2017   Procedure: ENDOBRONCHIAL ULTRASOUND;  Surgeon: Flora Lipps, MD;  Location: [facility] ORS;  Service: Cardiopulmonary;  Laterality: N/A;  . ENDOVASCULAR REPAIR/STENT GRAFT N/A 12/06/2016   Procedure: Endovascular Repair/Stent Graft;  Surgeon: Katha Cabal, MD;  Location: [facility];  Service: Cardiovascular;  Laterality: N/A;  . IR FLUORO GUIDE PORT INSERTION RIGHT  03/02/2017  . MASTECTOMY Left 2002   with  sentinel node     Social History   Socioeconomic History  . Marital status:  Widowed    Spouse name: Not on file  . Number of children: Not on file  . Years of education: Not on file  . Highest education level: Not on file  Occupational History  . Not on file  Social Needs  . Financial resource strain: Not on file  . Food insecurity:    Worry: Not on file    Inability: Not on file  . Transportation needs:    Medical: Not on file    Non-medical: Not on file  Tobacco Use  . Smoking status: Current Every Day Smoker    Packs/day: 0.50    Years: 50.00    Pack years: 25.00    Types: Cigarettes  . Smokeless tobacco: Never Used  Substance and Sexual Activity  . Alcohol use: No  . Drug use: No  . Sexual activity: Never  Lifestyle  . Physical activity:    Days per week: Not on file    Minutes per session: Not on file  . Stress: Not on file  Relationships  . Social connections:    Talks on phone: Not on file    Gets together: Not on file    Attends religious service: Not on file    Active member of club or organization: Not on file    Attends meetings of clubs or organizations: Not on file    Relationship status: Not on file  . Intimate partner violence:    Fear of current or ex partner: Not on file    Emotionally abused: Not on file    Physically abused: Not on file    Forced sexual activity: Not on file  Other Topics Concern  . Not on file  Social History Narrative  . Not on file    Family History  Problem Relation Age of Onset  . CAD Mother   . Colon cancer Brother   . Breast cancer Neg Hx      Current Outpatient Medications:  .  atorvastatin (LIPITOR) 10 MG tablet, Take 10 mg by mouth at bedtime. , Disp: , Rfl:  .  fluticasone-salmeterol (ADVAIR HFA) 115-21 MCG/ACT inhaler, Inhale 2 puffs into the lungs 2 (two) times daily., Disp: , Rfl:  .  Multiple Vitamin (MULTIVITAMIN) tablet, Take 1 tablet by mouth daily., Disp: , Rfl:  .  acetaminophen (TYLENOL) 325 MG tablet, Take 650 mg by mouth every 6 (six) hours as needed for mild pain. ,  Disp: , Rfl:  .  albuterol (2.5 MG/3ML) 0.083% NEBU 3 mL, albuterol (5 MG/ML) 0.5% NEBU 0.5 mL, Inhale 1 mg into the lungs., Disp: , Rfl:  .  albuterol (PROVENTIL HFA;VENTOLIN HFA) 108 (90 Base) MCG/ACT inhaler, Inhale 2 puffs into the lungs every 6 (six) hours as needed for wheezing or shortness of breath. (Patient not taking: Reported on 03/04/2018), Disp: 1 Inhaler, Rfl: 1 No current facility-administered medications for this visit.   Facility-Administered Medications Ordered in Other Visits:  .  sodium chloride flush (NS) 0.9 % injection 10 mL, 10 mL, Intravenous, PRN, Sindy Guadeloupe, MD, 10 mL at 03/26/17 0948 .  sodium chloride flush (NS) 0.9 % injection 10 mL, 10 mL, Intravenous, PRN, Sindy Guadeloupe, MD, 10 mL at 04/16/17 1006 .  sodium chloride flush (NS) 0.9 % injection 10 mL, 10 mL, Intravenous, PRN, Sindy Guadeloupe, MD, 10 mL at 01/10/18 1410  Physical exam:  Vitals:   03/04/18  0917  BP: 119/65  Pulse: 85  Resp: 18  Temp: 97.7 F (36.5 C)  TempSrc: Tympanic  SpO2: 99%  Weight: 109 lb 10.9 oz (49.7 kg)  Height: 5\' 7"  (1.702 m)   Physical Exam  Constitutional: She is oriented to person, place, and time.  Thin elderly frail female in no acute distress  HENT:  Head: Normocephalic and atraumatic.  Poor dentition  Eyes: Pupils are equal, round, and reactive to light. EOM are normal.  Neck: Normal range of motion.  Cardiovascular: Normal rate, regular rhythm and normal heart sounds.  Pulmonary/Chest: Effort normal and breath sounds normal.  Abdominal: Soft. Bowel sounds are normal.  Lymphadenopathy:  No palpable cervical, supraclavicular, axillary or inguinal adenopathy   Neurological: She is alert and oriented to person, place, and time.  Skin: Skin is warm and dry.     CMP Latest Ref Rng & Units 02/28/2018  Glucose 70 - 99 mg/dL 95  BUN 8 - 23 mg/dL 11  Creatinine 0.44 - 1.00 mg/dL 0.94  Sodium 135 - 145 mmol/L 139  Potassium 3.5 - 5.1 mmol/L 4.1  Chloride 98 - 111  mmol/L 99  CO2 22 - 32 mmol/L 30  Calcium 8.9 - 10.3 mg/dL 9.4  Total Protein 6.5 - 8.1 g/dL 7.2  Total Bilirubin 0.3 - 1.2 mg/dL 0.5  Alkaline Phos 38 - 126 U/L 103  AST 15 - 41 U/L 22  ALT 0 - 44 U/L 9   CBC Latest Ref Rng & Units 02/28/2018  WBC 3.6 - 11.0 K/uL 9.6  Hemoglobin 12.0 - 16.0 g/dL 12.5  Hematocrit 35.0 - 47.0 % 37.8  Platelets 150 - 440 K/uL 450(H)    No images are attached to the encounter.  Ct Chest W Contrast  Result Date: 02/28/2018 CLINICAL DATA:  Small-cell lung cancer. EXAM: CT CHEST, ABDOMEN, AND PELVIS WITH CONTRAST TECHNIQUE: Multidetector CT imaging of the chest, abdomen and pelvis was performed following the standard protocol during bolus administration of intravenous contrast. CONTRAST:  157mL OMNIPAQUE IOHEXOL 300 MG/ML  SOLN COMPARISON:  11/27/2017 FINDINGS: CT CHEST FINDINGS Cardiovascular: The heart size is normal. No substantial pericardial effusion. Coronary artery calcification is evident. Atherosclerotic calcification is noted in the wall of the thoracic aorta. Left Port-A-Cath tip is positioned at the SVC/RA junction. Mediastinum/Nodes: No mediastinal lymphadenopathy. There is no hilar lymphadenopathy. The esophagus has normal imaging features. There is no axillary lymphadenopathy. Lungs/Pleura: The central tracheobronchial airways are patent. Biapical pleuroparenchymal scarring again noted. 9 mm right upper lobe peripheral nodule (38/4) is unchanged. 2 mm right upper lobe nodule (43/4) also stable. Right upper lobe cavitary lesion measured on the prior study at 3.7 x 2.5 cm now measures 3.1 x 1.8 cm. Cavitary component has decreased in the interval. The nodular component described along the inferior margin of this lesion is similar today measuring 1.9 x 1.1 cm today compared to 2.0 x 1.1 cm previously. Other scattered areas of patchy irregular airspace disease are seen in the right lower lobe. Tree-in-bud ground-glass nodules with small airway impaction in  the posterior left lower lobe has progressed in the interval. Similar tree-in-bud disease seen in the lingula and right middle lobe on the prior study appears decreased in the interval. Confluent airspace disease in the superior segment right lower lobe is also similar measuring 2.0 x 1.9 cm today compared to 2.2 x 2.1 cm previously. Musculoskeletal: No worrisome lytic or sclerotic osseous abnormality. CT ABDOMEN PELVIS FINDINGS Hepatobiliary: No focal abnormality within the liver parenchyma. There  is no evidence for gallstones, gallbladder wall thickening, or pericholecystic fluid. No intrahepatic or extrahepatic biliary dilation. Pancreas: And No focal mass lesion. No dilatation of the main duct. No intraparenchymal cyst. No peripancreatic edema. Spleen: No splenomegaly. No focal mass lesion. Adrenals/Urinary Tract: No adrenal nodule or mass. Mild atrophy noted left kidney. Right kidney unremarkable. No evidence for hydroureter. The urinary bladder appears normal for the degree of distention. Stomach/Bowel: Stomach is nondistended. No gastric wall thickening. No evidence of outlet obstruction. Duodenum is normally positioned as is the ligament of Treitz. Duodenal distention is similar to prior. No small bowel wall thickening. No small bowel dilatation. The terminal ileum is normal. The appendix is normal. Diverticular changes are noted in the left colon without evidence of diverticulitis. Vascular/Lymphatic: Status post aortic endograft placement. There is no gastrohepatic or hepatoduodenal ligament lymphadenopathy. No intraperitoneal or retroperitoneal lymphadenopathy. No pelvic sidewall lymphadenopathy. Reproductive: Unremarkable. Other: No intraperitoneal free fluid. Musculoskeletal: No worrisome lytic or sclerotic osseous abnormality. IMPRESSION: 1. Waxing and waning appearance of tree-in-bud nodularity involving both lungs. Features suggest variably progressive and improving areas of atypical infection (MAI).  2. Irregular areas of confluent airspace opacity in the right upper lobe. The cavitary lesion seen on the previous study has decreased in the interval. The other dominant irregular opacities show no substantial interval change. More diffuse tiny irregular airspace opacities in the right upper lobe are also stable. While infectious/inflammatory etiology is likely, underlying neoplasm cannot be definitively excluded and continued attention recommended. 3.  Emphysema. (ICD10-J43.9) 4.  Aortic Atherosclerois (ICD10-170.0) Electronically Signed   By: Misty Stanley M.D.   On: 02/28/2018 11:16   Mr Jeri Cos YQ Contrast  Result Date: 02/12/2018 CLINICAL DATA:  Right upper lobe small cell lung cancer EXAM: MRI HEAD WITHOUT AND WITH CONTRAST TECHNIQUE: Multiplanar, multiecho pulse sequences of the brain and surrounding structures were obtained without and with intravenous contrast. CONTRAST:  74mL MULTIHANCE GADOBENATE DIMEGLUMINE 529 MG/ML IV SOLN COMPARISON:  11/22/2017 FINDINGS: Brain: No enhancement or swelling to suggest metastatic disease. Chronic small vessel ischemia in the periventricular white matter. Small remote left parietal cortex infarcts. No acute infarct, blood products, hydrocephalus, or collection. Vascular: Major flow voids and vascular enhancements are preserved Skull and upper cervical spine: Negative for marrow lesion Sinuses/Orbits: Diffuse mucosal thickening with maxillary sinus fluid levels bilaterally. Bilateral partial mastoid opacification, chronic when compared to prior. Negative nasopharynx. Bilateral cataract resection. IMPRESSION: 1. No evidence of metastatic disease. 2. Active sinusitis with bilateral maxillary fluid levels. Electronically Signed   By: Monte Fantasia M.D.   On: 02/12/2018 10:54   Ct Abdomen Pelvis W Contrast  Result Date: 02/28/2018 CLINICAL DATA:  Small-cell lung cancer. EXAM: CT CHEST, ABDOMEN, AND PELVIS WITH CONTRAST TECHNIQUE: Multidetector CT imaging of the  chest, abdomen and pelvis was performed following the standard protocol during bolus administration of intravenous contrast. CONTRAST:  160mL OMNIPAQUE IOHEXOL 300 MG/ML  SOLN COMPARISON:  11/27/2017 FINDINGS: CT CHEST FINDINGS Cardiovascular: The heart size is normal. No substantial pericardial effusion. Coronary artery calcification is evident. Atherosclerotic calcification is noted in the wall of the thoracic aorta. Left Port-A-Cath tip is positioned at the SVC/RA junction. Mediastinum/Nodes: No mediastinal lymphadenopathy. There is no hilar lymphadenopathy. The esophagus has normal imaging features. There is no axillary lymphadenopathy. Lungs/Pleura: The central tracheobronchial airways are patent. Biapical pleuroparenchymal scarring again noted. 9 mm right upper lobe peripheral nodule (38/4) is unchanged. 2 mm right upper lobe nodule (43/4) also stable. Right upper lobe cavitary lesion measured on the  prior study at 3.7 x 2.5 cm now measures 3.1 x 1.8 cm. Cavitary component has decreased in the interval. The nodular component described along the inferior margin of this lesion is similar today measuring 1.9 x 1.1 cm today compared to 2.0 x 1.1 cm previously. Other scattered areas of patchy irregular airspace disease are seen in the right lower lobe. Tree-in-bud ground-glass nodules with small airway impaction in the posterior left lower lobe has progressed in the interval. Similar tree-in-bud disease seen in the lingula and right middle lobe on the prior study appears decreased in the interval. Confluent airspace disease in the superior segment right lower lobe is also similar measuring 2.0 x 1.9 cm today compared to 2.2 x 2.1 cm previously. Musculoskeletal: No worrisome lytic or sclerotic osseous abnormality. CT ABDOMEN PELVIS FINDINGS Hepatobiliary: No focal abnormality within the liver parenchyma. There is no evidence for gallstones, gallbladder wall thickening, or pericholecystic fluid. No intrahepatic or  extrahepatic biliary dilation. Pancreas: And No focal mass lesion. No dilatation of the main duct. No intraparenchymal cyst. No peripancreatic edema. Spleen: No splenomegaly. No focal mass lesion. Adrenals/Urinary Tract: No adrenal nodule or mass. Mild atrophy noted left kidney. Right kidney unremarkable. No evidence for hydroureter. The urinary bladder appears normal for the degree of distention. Stomach/Bowel: Stomach is nondistended. No gastric wall thickening. No evidence of outlet obstruction. Duodenum is normally positioned as is the ligament of Treitz. Duodenal distention is similar to prior. No small bowel wall thickening. No small bowel dilatation. The terminal ileum is normal. The appendix is normal. Diverticular changes are noted in the left colon without evidence of diverticulitis. Vascular/Lymphatic: Status post aortic endograft placement. There is no gastrohepatic or hepatoduodenal ligament lymphadenopathy. No intraperitoneal or retroperitoneal lymphadenopathy. No pelvic sidewall lymphadenopathy. Reproductive: Unremarkable. Other: No intraperitoneal free fluid. Musculoskeletal: No worrisome lytic or sclerotic osseous abnormality. IMPRESSION: 1. Waxing and waning appearance of tree-in-bud nodularity involving both lungs. Features suggest variably progressive and improving areas of atypical infection (MAI). 2. Irregular areas of confluent airspace opacity in the right upper lobe. The cavitary lesion seen on the previous study has decreased in the interval. The other dominant irregular opacities show no substantial interval change. More diffuse tiny irregular airspace opacities in the right upper lobe are also stable. While infectious/inflammatory etiology is likely, underlying neoplasm cannot be definitively excluded and continued attention recommended. 3.  Emphysema. (ICD10-J43.9) 4.  Aortic Atherosclerois (ICD10-170.0) Electronically Signed   By: Misty Stanley M.D.   On: 02/28/2018 11:16      Assessment and plan- Patient is a 72 y.o. female with limited stage small cell lung cancer s/p cisplatin/etoposide and RT in dec 2018 and prophylactic PCI.  She is here for routine surveillance of small cell lung cancer  I have reviewed CT chest abdomen and pelvis images independently and discussed findings with the patient.  Right upper lobe density which was new and noted on CT scan in July 2019-cavitary component has improved.  The solid component persistent measures about 2 cm.  She was also noted to have airspace disease in the right lower lobe about 2 cm previously which is also stable today.  There are no other findings concerning for metastatic disease.  It is unclear if there is a solid component in the right upper lobe represents atypical infection versus malignancy.  I will discuss her case at tumor board this week.  I am in favor of repeating CT scans in 3 months time to assess if this solid component is changing.  Patient did have a rough time going through her chemoradiation and is frail.  She is not too keen to pursue treatment at this time unless it is absolutely necessary.  We will reach out to her after her case is discussed at tumor board this week   Visit Diagnosis 1. Encounter for follow-up surveillance of lung cancer   2. Abnormal CT scan of lung   3. Influenza vaccination administered at current visit      Dr. Randa Evens, MD, MPH [facility] [phone] 03/04/2018 10:35 AM

## 2018-03-07 ENCOUNTER — Other Ambulatory Visit: Payer: Self-pay | Admitting: *Deleted

## 2018-03-07 DIAGNOSIS — C3490 Malignant neoplasm of unspecified part of unspecified bronchus or lung: Secondary | ICD-10-CM

## 2018-03-29 ENCOUNTER — Other Ambulatory Visit: Payer: Self-pay | Admitting: Oncology

## 2018-03-29 DIAGNOSIS — C3411 Malignant neoplasm of upper lobe, right bronchus or lung: Secondary | ICD-10-CM

## 2018-04-01 ENCOUNTER — Telehealth: Payer: Self-pay

## 2018-04-01 NOTE — Telephone Encounter (Signed)
PA approved for Lidocaine - Prilocaine 2.5 - 2.5 % ( Rx # [id number]) Approved 12 / 30/19 - 07/02/2018. Patient made aware.

## 2018-04-02 ENCOUNTER — Telehealth: Payer: Self-pay | Admitting: *Deleted

## 2018-04-02 DIAGNOSIS — C3411 Malignant neoplasm of upper lobe, right bronchus or lung: Secondary | ICD-10-CM

## 2018-04-02 MED ORDER — LIDOCAINE-PRILOCAINE 2.5-2.5 % EX CREA: TOPICAL_CREAM | CUTANEOUS | 3 refills | Status: DC

## 2018-04-02 NOTE — Telephone Encounter (Signed)
-----   Message from Secundino Ginger sent at 04/02/2018  8:41 AM EST ----- Regarding: Medication Refill Good Morning, Pt called and stated that she needs her ELMA cream sent to [place]. Due to her insurance not covering the medication there. If you could please re-send to [facility] drug, she would greatly appreciate it.  Thank you Jerene Pitch

## 2018-04-02 NOTE — Telephone Encounter (Signed)
I refilled med and sent it to [facility]. However I am unable to get in touch with her to tell her it is done

## 2018-04-02 NOTE — Telephone Encounter (Signed)
-----   Message from Secundino Ginger sent at 04/02/2018  8:41 AM EST ----- Regarding: Medication Refill Good Morning, Pt called and stated that she needs her ELMA cream sent to [place]. Due to her insurance not covering the medication there. If you could please re-send to [facility] drug, she would greatly appreciate it.  Thank you Jerene Pitch

## 2018-04-04 ENCOUNTER — Inpatient Hospital Stay: Payer: Medicare Other | Attending: Oncology

## 2018-04-04 DIAGNOSIS — Z452 Encounter for adjustment and management of vascular access device: Secondary | ICD-10-CM | POA: Insufficient documentation

## 2018-04-04 DIAGNOSIS — C3411 Malignant neoplasm of upper lobe, right bronchus or lung: Secondary | ICD-10-CM | POA: Diagnosis present

## 2018-04-04 MED ORDER — HEPARIN SOD (PORK) LOCK FLUSH 100 UNIT/ML IV SOLN
500.0000 [IU] | Freq: Once | INTRAVENOUS | Status: AC
  Administered 2018-04-04: 500 [IU] via INTRAVENOUS

## 2018-04-04 MED ORDER — SODIUM CHLORIDE 0.9% FLUSH
10.0000 mL | INTRAVENOUS | Status: DC | PRN
  Administered 2018-04-04: 10 mL via INTRAVENOUS
  Filled 2018-04-04: qty 10

## 2018-04-28 ENCOUNTER — Emergency Department: Payer: Medicare Other

## 2018-04-28 ENCOUNTER — Emergency Department
Admission: EM | Admit: 2018-04-28 | Discharge: 2018-04-28 | Disposition: A | Discharge status: Home / Self Care | Payer: Medicare Other | Attending: Emergency Medicine | Admitting: Emergency Medicine

## 2018-04-28 ENCOUNTER — Encounter: Payer: Self-pay | Admitting: Emergency Medicine

## 2018-04-28 ENCOUNTER — Other Ambulatory Visit: Payer: Self-pay

## 2018-04-28 DIAGNOSIS — J449 Chronic obstructive pulmonary disease, unspecified: Secondary | ICD-10-CM | POA: Diagnosis not present

## 2018-04-28 DIAGNOSIS — W01190A Fall on same level from slipping, tripping and stumbling with subsequent striking against furniture, initial encounter: Secondary | ICD-10-CM | POA: Insufficient documentation

## 2018-04-28 DIAGNOSIS — Z79899 Other long term (current) drug therapy: Secondary | ICD-10-CM | POA: Diagnosis not present

## 2018-04-28 DIAGNOSIS — Y939 Activity, unspecified: Secondary | ICD-10-CM | POA: Diagnosis not present

## 2018-04-28 DIAGNOSIS — S79912A Unspecified injury of left hip, initial encounter: Secondary | ICD-10-CM | POA: Diagnosis present

## 2018-04-28 DIAGNOSIS — Y999 Unspecified external cause status: Secondary | ICD-10-CM | POA: Insufficient documentation

## 2018-04-28 DIAGNOSIS — F17210 Nicotine dependence, cigarettes, uncomplicated: Secondary | ICD-10-CM | POA: Insufficient documentation

## 2018-04-28 DIAGNOSIS — S7002XA Contusion of left hip, initial encounter: Secondary | ICD-10-CM | POA: Insufficient documentation

## 2018-04-28 DIAGNOSIS — Y92008 Other place in unspecified non-institutional (private) residence as the place of occurrence of the external cause: Secondary | ICD-10-CM | POA: Diagnosis not present

## 2018-04-28 DIAGNOSIS — W19XXXA Unspecified fall, initial encounter: Secondary | ICD-10-CM

## 2018-04-28 DIAGNOSIS — Y92009 Unspecified place in unspecified non-institutional (private) residence as the place of occurrence of the external cause: Secondary | ICD-10-CM

## 2018-04-28 MED ORDER — TRAMADOL HCL 50 MG PO TABS: 50.0000 mg | ORAL_TABLET | Freq: Four times a day (QID) | ORAL | 0 refills | Status: DC | PRN

## 2018-04-28 MED ORDER — TRAMADOL HCL 50 MG PO TABS
50.0000 mg | ORAL_TABLET | Freq: Once | ORAL | Status: AC
  Administered 2018-04-28: 50 mg via ORAL
  Filled 2018-04-28: qty 1

## 2018-04-28 NOTE — Discharge Instructions (Signed)
Follow-up with your primary care provider if any continued problems are also if continued pain medication as needed.  Apply ice to your hip as needed.  You may continue taking ibuprofen for inflammation.  Take tramadol every 6 hours as needed for pain.  Do not take medication and drive or operate machinery as this medication could cause drowsiness.

## 2018-04-28 NOTE — ED Provider Notes (Signed)
[facility] Emergency Department Provider Note  ____________________________________________   First MD Initiated Contact with Patient 04/28/18 1027     (approximate)  I have reviewed the triage vital signs and the nursing notes.   HISTORY  Chief Complaint Fall   HPI Roberta [name] is a 72 y.o. female resents to the ED with complaint of left hip pain for 2 weeks.  Patient states that she fell 2 weeks ago and landed on her sofa.  She states that she was not seen at that time and has continued to ambulate without assistance.  She states that the pain has not improved and at night when she goes to bed she feels "pins-and-needles".  Patient denies any previous paresthesias into her leg.  She has been taking over-the-counter medication without any relief.  Head injury or loss of consciousness during her fall.  Patient rates her pain as a 9/10.   Past Medical History:  Diagnosis Date  . AAA (abdominal aortic aneurysm) ([place])   . Anemia   . Breast cancer ([place]) 2002   left  . COPD (chronic obstructive pulmonary disease) ([place])   . Dyspnea   . Headache   . High cholesterol   . Personal history of chemotherapy   . Small cell lung cancer ([place])   . Tobacco abuse     Patient Active Problem List   Diagnosis Date Noted  . Protein-calorie malnutrition, severe 10/29/2017  . Sepsis ([place]) 04/28/2017  . 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 Goals of care, counseling/discussion 02/23/2017  . Lung mass   . AAA (abdominal aortic aneurysm) without rupture ([place]) 11/02/2016  . COPD mixed type ([place]) 11/02/2016  . Hyperlipidemia 11/02/2016  . PAD (peripheral artery disease) ([place]) 11/02/2016    Past Surgical History:  Procedure Laterality Date  . ABDOMINAL HYSTERECTOMY    . ARTERY BIOPSY Right 12/05/2017   Procedure: BIOPSY TEMPORAL ARTERY;  Surgeon: Margaretha Sheffield, MD;  Location: [facility] ORS;  Service: ENT;  Laterality: Right;  . ENDOBRONCHIAL  ULTRASOUND N/A 02/19/2017   Procedure: ENDOBRONCHIAL ULTRASOUND;  Surgeon: Flora Lipps, MD;  Location: [facility] ORS;  Service: Cardiopulmonary;  Laterality: N/A;  . ENDOVASCULAR REPAIR/STENT GRAFT N/A 12/06/2016   Procedure: Endovascular Repair/Stent Graft;  Surgeon: Katha Cabal, MD;  Location: [place] [facility];  Service: Cardiovascular;  Laterality: N/A;  . IR FLUORO GUIDE PORT INSERTION RIGHT  03/02/2017  . MASTECTOMY Left 2002   with sentinel node     Prior to Admission medications   Medication Sig Start Date End Date Taking? Authorizing Provider  acetaminophen (TYLENOL) 325 MG tablet Take 650 mg by mouth every 6 (six) hours as needed for mild pain.     [provider]  albuterol (2.5 MG/3ML) 0.083% NEBU 3 mL, albuterol (5 MG/ML) 0.5% NEBU 0.5 mL Inhale 1 mg into the lungs.    [provider]  albuterol (PROVENTIL HFA;VENTOLIN HFA) 108 (90 Base) MCG/ACT inhaler Inhale 2 puffs into the lungs every 6 (six) hours as needed for wheezing or shortness of breath. Patient not taking: Reported on 03/04/2018 03/23/16   Frederich Cha, MD  atorvastatin (LIPITOR) 10 MG tablet Take 10 mg by mouth at bedtime.  08/11/16   [provider]  fluticasone-salmeterol (ADVAIR HFA) 115-21 MCG/ACT inhaler Inhale 2 puffs into the lungs 2 (two) times daily.    [provider]  lidocaine-prilocaine (EMLA) cream Place small amount of cream over port site when she gets port flushes 04/02/18  Sindy Guadeloupe, MD  Multiple Vitamin (MULTIVITAMIN) tablet Take 1 tablet by mouth daily.    [provider]  traMADol (ULTRAM) 50 MG tablet Take 1 tablet (50 mg total) by mouth every 6 (six) hours as needed. 04/28/18   Johnn Hai, PA-C    Allergies Patient has no known allergies.  Family History  Problem Relation Age of Onset  . CAD Mother   . Colon cancer Brother   . Breast cancer Neg Hx     Social History Social History   Tobacco Use  . Smoking status: Current Every  Day Smoker    Packs/day: 0.50    Years: 50.00    Pack years: 25.00    Types: Cigarettes  . Smokeless tobacco: Never Used  Substance Use Topics  . Alcohol use: No  . Drug use: No    Review of Systems Constitutional: No fever/chills Eyes: No visual changes. ENT: No trauma. Cardiovascular: Denies chest pain. Respiratory: Denies shortness of breath. Gastrointestinal: No abdominal pain.  No nausea, no vomiting.   Musculoskeletal: Positive for left hip pain. Skin: Negative for rash. Neurological: Negative for headaches, focal weakness or numbness. ___________________________________________   PHYSICAL EXAM:  VITAL SIGNS: ED Triage Vitals  Enc Vitals Group     BP 04/28/18 1014 124/66     Pulse Rate 04/28/18 1014 100     Resp 04/28/18 1014 16     Temp 04/28/18 1014 (!) 97.3 F (36.3 C)     Temp Source 04/28/18 1014 Oral     SpO2 04/28/18 1014 96 %     Weight 04/28/18 1014 115 lb (52.2 kg)     Height 04/28/18 1014 5\' 7"  (1.702 m)     Head Circumference --      Peak Flow --      Pain Score 04/28/18 1018 9     Pain Loc --      Pain Edu? --      Excl. in [place]? --     Constitutional: Alert and oriented. Well appearing and in no acute distress. Eyes: Conjunctivae are normal. PERRL. EOMI. Head: Atraumatic. Nose: No trauma. Neck: No stridor.   Cardiovascular: Normal rate, regular rhythm. Grossly normal heart sounds.  Good peripheral circulation. Respiratory: Normal respiratory effort.  No retractions. Lungs CTAB. Gastrointestinal: Soft and nontender. No distention. Musculoskeletal: On examination of the left hip there is moderate tenderness on palpation of the lateral and posterior aspect.  Range of motion is minimally restricted but patient is able to move in all 4 planes.  There was no rotation or shortening noted.  Nontender knee or ankle to palpation.  Skin is intact.  Pulse present.  Her sensory function intact. Neurologic:  Normal speech and language. No gross focal  neurologic deficits are appreciated. Skin:  Skin is warm, dry and intact. No rash noted. Psychiatric: Mood and affect are normal. Speech and behavior are normal.  ____________________________________________   LABS (all labs ordered are listed, but only abnormal results are displayed)  Labs Reviewed - No data to display  RADIOLOGY  ED MD interpretation:   Left hip x-ray is negative for acute injury.  Official radiology report(s): Dg Hip Unilat W Or Wo Pelvis 2-3 Views Left  Result Date: 04/28/2018 CLINICAL DATA:  Left hip and groin pain after a fall 2 weeks ago. EXAM: DG HIP (WITH OR WITHOUT PELVIS) 2-3V LEFT COMPARISON:  CT abdomen and pelvis 02/28/2018 FINDINGS: No acute fracture or hip dislocation is identified. Hip joint space widths are  relatively well preserved. An aorto bi-iliac stent graft is partially visualized. IMPRESSION: No acute osseous abnormality. Electronically Signed   By: Logan Bores M.D.   On: 04/28/2018 11:42    ____________________________________________   PROCEDURES  Procedure(s) performed: None  Procedures  Critical Care performed: No  ____________________________________________   INITIAL IMPRESSION / ASSESSMENT AND PLAN / ED COURSE  As part of my medical decision making, I reviewed the following data within the electronic MEDICAL RECORD NUMBER Notes from prior ED visits and [place] Controlled Substance Database  72 year old female presents today with 2-week history of left hip pain after she states she failed at home and landed on her sofa.  She is continued to ambulate without any assistance.  She has been taking over-the-counter medication without any relief of her pain.  She denies any head injury or loss of consciousness.  Physical exam is low suspicion for a hip fracture and x-ray confirms that no bony abnormality is noted.  Patient is continue over-the-counter medication but was also given tramadol 50 mg 1 every 6 hours as needed for pain.  She was  given a limited amount and told that if she has continued need for pain medication she would need to follow-up with her PCP.  She is encouraged to use ice to her hip as needed for pain. ____________________________________________   FINAL CLINICAL IMPRESSION(S) / ED DIAGNOSES  Final diagnoses:  Contusion of left hip, initial encounter  Fall in home, initial encounter     ED Discharge Orders         Ordered    traMADol (ULTRAM) 50 MG tablet  Every 6 hours PRN     04/28/18 1150           Note:  This document was prepared using Dragon voice recognition software and may include unintentional dictation errors.   Johnn Hai, PA-C 04/28/18 1202  Harvest Dark, MD 04/28/18 1419

## 2018-04-28 NOTE — ED Triage Notes (Signed)
Pt to ED via POV c/o fall 2 weeks ago, pt states that since then she has been having left hip pain that has not gotten any better. Pt also c/o feeling of "pins and needles" in her legs at night when she goes to bed. Pt denies hx/o DM. Pt is in NAD.

## 2018-06-07 ENCOUNTER — Ambulatory Visit
Admission: RE | Admit: 2018-06-07 | Discharge: 2018-06-07 | Disposition: A | Discharge status: Home / Self Care | Payer: Medicare HMO | Source: Ambulatory Visit | Attending: Oncology | Admitting: Oncology

## 2018-06-07 ENCOUNTER — Telehealth: Payer: Self-pay | Admitting: *Deleted

## 2018-06-07 ENCOUNTER — Inpatient Hospital Stay: Payer: Medicare HMO | Attending: Hematology and Oncology

## 2018-06-07 ENCOUNTER — Other Ambulatory Visit: Payer: Self-pay | Admitting: Oncology

## 2018-06-07 ENCOUNTER — Other Ambulatory Visit: Payer: Self-pay | Admitting: *Deleted

## 2018-06-07 DIAGNOSIS — E861 Hypovolemia: Secondary | ICD-10-CM | POA: Diagnosis not present

## 2018-06-07 DIAGNOSIS — Z9221 Personal history of antineoplastic chemotherapy: Secondary | ICD-10-CM | POA: Insufficient documentation

## 2018-06-07 DIAGNOSIS — Z79899 Other long term (current) drug therapy: Secondary | ICD-10-CM | POA: Insufficient documentation

## 2018-06-07 DIAGNOSIS — Z79818 Long term (current) use of other agents affecting estrogen receptors and estrogen levels: Secondary | ICD-10-CM | POA: Insufficient documentation

## 2018-06-07 DIAGNOSIS — C3490 Malignant neoplasm of unspecified part of unspecified bronchus or lung: Secondary | ICD-10-CM

## 2018-06-07 DIAGNOSIS — S32041A Stable burst fracture of fourth lumbar vertebra, initial encounter for closed fracture: Secondary | ICD-10-CM | POA: Diagnosis not present

## 2018-06-07 DIAGNOSIS — E43 Unspecified severe protein-calorie malnutrition: Secondary | ICD-10-CM | POA: Diagnosis not present

## 2018-06-07 DIAGNOSIS — R630 Anorexia: Secondary | ICD-10-CM | POA: Insufficient documentation

## 2018-06-07 DIAGNOSIS — R5381 Other malaise: Secondary | ICD-10-CM | POA: Insufficient documentation

## 2018-06-07 DIAGNOSIS — R937 Abnormal findings on diagnostic imaging of other parts of musculoskeletal system: Secondary | ICD-10-CM

## 2018-06-07 DIAGNOSIS — J449 Chronic obstructive pulmonary disease, unspecified: Secondary | ICD-10-CM | POA: Diagnosis not present

## 2018-06-07 DIAGNOSIS — I714 Abdominal aortic aneurysm, without rupture: Secondary | ICD-10-CM | POA: Diagnosis not present

## 2018-06-07 DIAGNOSIS — Z853 Personal history of malignant neoplasm of breast: Secondary | ICD-10-CM | POA: Diagnosis not present

## 2018-06-07 DIAGNOSIS — C3411 Malignant neoplasm of upper lobe, right bronchus or lung: Secondary | ICD-10-CM | POA: Diagnosis not present

## 2018-06-07 DIAGNOSIS — I9589 Other hypotension: Secondary | ICD-10-CM | POA: Diagnosis not present

## 2018-06-07 DIAGNOSIS — E7800 Pure hypercholesterolemia, unspecified: Secondary | ICD-10-CM | POA: Diagnosis not present

## 2018-06-07 DIAGNOSIS — R634 Abnormal weight loss: Secondary | ICD-10-CM | POA: Insufficient documentation

## 2018-06-07 DIAGNOSIS — F17210 Nicotine dependence, cigarettes, uncomplicated: Secondary | ICD-10-CM | POA: Insufficient documentation

## 2018-06-07 DIAGNOSIS — S32049A Unspecified fracture of fourth lumbar vertebra, initial encounter for closed fracture: Secondary | ICD-10-CM

## 2018-06-07 DIAGNOSIS — Z923 Personal history of irradiation: Secondary | ICD-10-CM | POA: Diagnosis not present

## 2018-06-07 LAB — COMPREHENSIVE METABOLIC PANEL
ALBUMIN: 3.5 g/dL (ref 3.5–5.0)
ALT: 12 U/L (ref 0–44)
AST: 20 U/L (ref 15–41)
Alkaline Phosphatase: 108 U/L (ref 38–126)
Anion gap: 9 (ref 5–15)
BUN: 15 mg/dL (ref 8–23)
CALCIUM: 8.6 mg/dL — AB (ref 8.9–10.3)
CHLORIDE: 99 mmol/L (ref 98–111)
CO2: 28 mmol/L (ref 22–32)
CREATININE: 0.75 mg/dL (ref 0.44–1.00)
GFR calc Af Amer: >60 mL/min (ref >60)
GFR calc non Af Amer: >60 mL/min (ref >60)
GLUCOSE: 103 mg/dL — AB (ref 70–99)
Potassium: 3.9 mmol/L (ref 3.5–5.1)
Sodium: 136 mmol/L (ref 135–145)
Total Bilirubin: 0.4 mg/dL (ref 0.3–1.2)
Total Protein: 6.9 g/dL (ref 6.5–8.1)

## 2018-06-07 LAB — CBC WITH DIFFERENTIAL/PLATELET
Abs Immature Granulocytes: 0.04 10*3/uL (ref 0.00–0.07)
BASOS ABS: 0 10*3/uL (ref 0.0–0.1)
Basophils Relative: 0 %
EOS ABS: 0.1 10*3/uL (ref 0.0–0.5)
EOS PCT: 1 %
HEMATOCRIT: 40.7 % (ref 36.0–46.0)
HEMOGLOBIN: 13 g/dL (ref 12.0–15.0)
Immature Granulocytes: 0 %
LYMPHS ABS: 1.9 10*3/uL (ref 0.7–4.0)
Lymphocytes Relative: 18 %
MCH: 29.1 pg (ref 26.0–34.0)
MCHC: 31.9 g/dL (ref 30.0–36.0)
MCV: 91.1 fL (ref 80.0–100.0)
MONOS PCT: 9 %
Monocytes Absolute: 0.9 10*3/uL (ref 0.1–1.0)
Neutro Abs: 7.4 10*3/uL (ref 1.7–7.7)
Neutrophils Relative %: 72 %
Platelets: 214 10*3/uL (ref 150–400)
RBC: 4.47 MIL/uL (ref 3.87–5.11)
RDW: 14.5 % (ref 11.5–15.5)
WBC: 10.4 10*3/uL (ref 4.0–10.5)
nRBC: 0 % (ref 0.0–0.2)

## 2018-06-07 MED ORDER — HEPARIN SOD (PORK) LOCK FLUSH 100 UNIT/ML IV SOLN
500.0000 [IU] | Freq: Once | INTRAVENOUS | Status: AC
  Administered 2018-06-07: 500 [IU] via INTRAVENOUS
  Filled 2018-06-07: qty 5

## 2018-06-07 MED ORDER — SODIUM CHLORIDE 0.9% FLUSH
10.0000 mL | INTRAVENOUS | Status: DC | PRN
  Filled 2018-06-07: qty 10

## 2018-06-07 MED ORDER — IOHEXOL 300 MG/ML  SOLN
100.0000 mL | Freq: Once | INTRAMUSCULAR | Status: AC | PRN
  Administered 2018-06-07: 100 mL via INTRAVENOUS

## 2018-06-07 NOTE — Telephone Encounter (Signed)
After Dr. Janese Banks is spoke to Dr. Mike Gip about this patient's and her CT results.  Dr. Mike Gip suggested that patient go to the emergency department so that something could be done about her L4 burst fracture.  Reason for the decision of going to the ER where sure she is in pain as well as the MRI that we would like to have scheduled for her the earliest that can be done is January 23.  I have now left a message for Ms. Mandler with this information and that when she sees gets the message that she should go to the emergency department.  Told her that when she gets there does tell the ER to look at the CT scan and can call the on-call oncologist.  Dr. Rogue Bussing is on call this weekend and have already made him aware of the situation

## 2018-06-07 NOTE — Telephone Encounter (Signed)
Called patient and left a message to have her call me about her results today of her CT scan and what additional tests that we might need to have her do.  Asked for patient to call me back directly and left my #[phone].  So far this afternoon the patient has not called me back.  Patient is scheduled to see Dr. Mike Gip in the [facility] office on January 13.  Dr. Janese Banks has given an information about what needs to take place in transferring the patient from Dr. Janese Banks to Dr. Mike Gip.  With the results of the CT scan showing a burst fracture of the L4.  Patient is needing an MRI of her lumbar spine and thoracic spine.  The MRI traveling machine would not be able to do this type of MRI.  The first available MRI here at [facility] regional is January 23.  Try to get it arranged for sooner but because 1 of the MRI machines at [facility] regional is down there is only one machine and use and that is the earliest that they can do.  We could possibly get an MRI in [place] if patient is agreeable.  All of this information has relayed to the [place] team

## 2018-06-07 NOTE — Telephone Encounter (Signed)
I have called patient twice today about results and that Dr,. Janese Banks said that she needs MRI. I scheduled it but first available for MRI  Is 1/23. Dr. Janese Banks did not want it to be that far out. The case will be trf to [place] because pt wants to stay in [place]. Dr. Mike Gip wanted patient to go to the ER due to her L4 burst fracture.  I called again to patient and got voicemail again with patient and left her a message explaining the ct results and the MRI won't be able to be done til 1/23 and it is suggested for patient to come to ER and tell staff to look at your ct results and have md oncall [facility]. I have also spoke with Dr. Rogue Bussing about the situation because he is on call

## 2018-06-07 NOTE — Telephone Encounter (Signed)
Called report  IMPRESSION: 1. Interval development of L4 vertebral body burst fracture with mild retropulsion of the posterior aspect of the vertebral body. There is associated sclerosis of the L4 vertebral body with significant height loss predominately involving the anterior and mid aspect of the vertebral body. Possibility of pathologic fracture not excluded. Additionally, there is a nondisplaced right L4 transverse process fracture. Recommend further evaluation with lumbar spine MRI. 2. Findings suggestive of mild acute sigmoid colonic diverticulitis. 3. Similar to mild interval increase in size of previously described lesion within the right upper lobe. Adjacent irregular nodule along the inferior margin within the right upper lobe is grossly similar when compared to prior. Additional areas of consolidation within the right lung are grossly similar. 4. Interval improvement in previously described tree-in-bud nodularity within the left lower lobe with slight interval increase in ground-glass nodularity within the right lower lobe suggestive of atypical infectious process (MAI). 5. These results will be called to the ordering clinician or representative by the Radiologist Assistant, and communication documented in the PACS or zVision Dashboard.   Electronically Signed   By: Lovey Newcomer M.D.   On: 06/07/2018 12:13

## 2018-06-08 DIAGNOSIS — S32041A Stable burst fracture of fourth lumbar vertebra, initial encounter for closed fracture: Secondary | ICD-10-CM | POA: Insufficient documentation

## 2018-06-08 NOTE — Progress Notes (Signed)
[facility] day:  06/10/2018  Chief Complaint: Roberta [name] is a 73 y.o. female with limited stage small cell lung cancer who is seen for new patient assessment.  HPI:  The patient has a 50-pack-year smoking history.  She presented with increasing cough hemoptysis and weight loss. Chest CT on 01/23/2017 revealed a large right suprahilar mass extending into the mediastinum consistent with primary lung carcinoma.  PET scan on 02/07/2017 revealed no evidence of disease outside the chest.  Head MRI on 02/21/2017 revealed no evidence of metastatic disease.  Bronchoscopy on 02/19/2017 confirmed small cell lung carcinoma.   She had stage IIIB (T3N2M0) disease.  She received concurrent cisplatin and etoposide from 03/05/2017 - 05/14/2017.   Radiation received radiation from  03/08/2017 - 04/23/2017.  Chest, abdomen, and pelvic CT on 05/30/2017 revealed continued improvement in the right upper lobe mass, right hilar and mediastinal lymphadenopathy consistent with response to therapy.  There was interval development of multiple small ill-defined peribronchovascular nodules in both lungs, greatest in the right upper lobe. Some of these were clustered, and were not associated with the bronchovascular bundles such that lymphangitic tumor was unlikely. These were likely inflammatory.   There was no evidence of metastatic disease in the abdomen or pelvis  She received PCI from 06/19/2017 - 07/11/2017.  She was last seen in the medical oncology clinic by Dr Janese Banks on 03/04/2018.  At that time, headaches were getting better.  She was treated with high-dose prednisone by ENT for possible temporal arteritis.  She was off prednisone.  She continued to smoke less than half pack per day.  Energy level was better.  She denied any specific complaints.  Patient seen in [facility] ER on 04/28/2018 s/p fall.  Notes reviewed.  She complained of left hip pain x 2 weeks.  Left hip films  revealed no acute osseous abnormality.  She received tramadol 50 mg 1 every 6 hours as needed for pain.  Chest, abdomen, and pelvic CT on 06/07/2018 revealed interval development of L4 vertebral body burst fracture with mild retropulsion of the posterior aspect of the vertebral body.  There was associated sclerosis of the L4 vertebral body with significant height loss predominately involving the anterior and mid aspect of the vertebral body. Possibility of pathologic fracture was not excluded. There was a nondisplaced right L4 transverse process fracture. Recommend further evaluation with lumbar spine MRI.  There were findings suggestive of mild acute sigmoid colonic diverticulitis.  There was similar to mild interval increase in size of previously described lesion within the right upper lobe (3.1 x 1.8 cm to 3.2 x 2.2 cm).  Adjacent irregular nodule along the inferior margin within the right upper lobe was grossly similar when compared to prior (1.9 x 1.1 cm to 1.2 x 2.0 cm).  Additional areas of consolidation within the right lung were grossly similar.  There was interval improvement in previously described tree-in-bud nodularity within the left lower lobe with slight interval increase in ground-glass nodularity within the right lower lobe suggestive of atypical infectious process (MAI).  Symptomatically, patient has been feeling weak over the course of the last week. She prevents to the office today HYPOtensive with (+) orthostatic changes. Patient notes that she is extremely dizzy with position changes. Patient with 1 week of coryza and worsening cough. She has progressive exertional dyspnea requiring the use of her rescue inhaler more often. SPO2 96% on room air today in clinic.  She denies any nausea,  vomiting, diarrhea, or fevers.   Patient advises that she maintains an adequate appetite. She is eating well. Weight today is 105 lb 14.9 oz (48.1 kg), which compared to her last visit to the clinic,  represents a 4 pound weight loss. Patient very thin appearing with evidence of temporal wasting.   Patient denies pain in the clinic today.  She has a history of left breast cancer in 2002.  She underwent left mastectomy followed by adjuvant chemotherapy. She was treated by Dr. Oliva Bustard. She did not receive adjuvant radiation.   Past Medical History:  Diagnosis Date  . AAA (abdominal aortic aneurysm) ([place])   . Anemia   . Breast cancer ([place]) 2002   left  . COPD (chronic obstructive pulmonary disease) ([place])   . Dyspnea   . Headache   . High cholesterol   . Personal history of chemotherapy   . Small cell lung cancer ([place])   . Tobacco abuse     Past Surgical History:  Procedure Laterality Date  . ABDOMINAL HYSTERECTOMY    . ARTERY BIOPSY Right 12/05/2017   Procedure: BIOPSY TEMPORAL ARTERY;  Surgeon: Margaretha Sheffield, MD;  Location: [facility] ORS;  Service: ENT;  Laterality: Right;  . ENDOBRONCHIAL ULTRASOUND N/A 02/19/2017   Procedure: ENDOBRONCHIAL ULTRASOUND;  Surgeon: Flora Lipps, MD;  Location: [facility] ORS;  Service: Cardiopulmonary;  Laterality: N/A;  . ENDOVASCULAR REPAIR/STENT GRAFT N/A 12/06/2016   Procedure: Endovascular Repair/Stent Graft;  Surgeon: Katha Cabal, MD;  Location: [facility];  Service: Cardiovascular;  Laterality: N/A;  . IR FLUORO GUIDE PORT INSERTION RIGHT  03/02/2017  . MASTECTOMY Left 2002   with sentinel node     Family History  Problem Relation Age of Onset  . CAD Mother   . Colon cancer Brother   . Breast cancer Neg Hx     Social History:  reports that she has been smoking cigarettes. She has a 25.00 pack-year smoking history. She has never used smokeless tobacco. She reports that she does not drink alcohol or use drugs.  She is a 0.5 ppd smoker; formally 1 ppd smoker since age of 83. 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 The patient is accompanied by her sister, Roberta [name], today.  Allergies: No Known Allergies  Current  Medications: Current Outpatient Medications  Medication Sig Dispense Refill  . acetaminophen (TYLENOL) 325 MG tablet Take 650 mg by mouth every 6 (six) hours as needed for mild pain.     Marland Kitchen albuterol (2.5 MG/3ML) 0.083% NEBU 3 mL, albuterol (5 MG/ML) 0.5% NEBU 0.5 mL Inhale 1 mg into the lungs.    Marland Kitchen albuterol (PROVENTIL HFA;VENTOLIN HFA) 108 (90 Base) MCG/ACT inhaler Inhale 2 puffs into the lungs every 6 (six) hours as needed for wheezing or shortness of breath. 1 Inhaler 1  . atorvastatin (LIPITOR) 10 MG tablet Take 10 mg by mouth at bedtime.     . fluticasone-salmeterol (ADVAIR HFA) 115-21 MCG/ACT inhaler Inhale 2 puffs into the lungs 2 (two) times daily.    Marland Kitchen lidocaine-prilocaine (EMLA) cream Place small amount of cream over port site when she gets port flushes 30 g 3  . Multiple Vitamin (MULTIVITAMIN) tablet Take 1 tablet by mouth daily.     No current facility-administered medications for this visit.    Facility-Administered Medications Ordered in Other Visits  Medication Dose Route Frequency Provider Last Rate Last Dose  . sodium chloride flush (NS) 0.9 % injection 10 mL  10 mL Intravenous PRN Randa Evens  C, MD   10 mL at 03/26/17 0948  . sodium chloride flush (NS) 0.9 % injection 10 mL  10 mL Intravenous PRN Sindy Guadeloupe, MD   10 mL at 04/16/17 1006  . sodium chloride flush (NS) 0.9 % injection 10 mL  10 mL Intravenous PRN Sindy Guadeloupe, MD   10 mL at 01/10/18 1410    Review of Systems:  GENERAL:  Feels "weak".  No fevers, sweats or.  Weight loss of 4 pounds. PERFORMANCE STATUS (ECOG):  1-2 HEENT:  Runny nose.  No visual changes, sore throat, mouth sores or tenderness. Lungs: Shortness of breath with exertion, uses an inhaler.  No cough.  No hemoptysis. Cardiac:  No chest pain, palpitations, orthopnea, or PND. GI:  No nausea, vomiting, diarrhea, constipation, melena or hematochezia. GU:  No urgency, frequency, dysuria, or hematuria. Musculoskeletal:  No back pain.  Hip pain  after fall.  No muscle tenderness. Extremities:  No pain or swelling. Skin:  No rashes or skin changes. Neuro:  Dizzy with position changes.  No headache, numbness or focal weakness, balance or coordination issues. Endocrine:  No diabetes, thyroid issues, hot flashes or night sweats. Psych:  No mood changes, depression or anxiety. Pain:  No focal pain. Review of systems:  All other systems reviewed and found to be negative.  Physical Exam: Blood pressure (!) 80/55, pulse (!) 103, temperature 97.8 F (36.6 C), temperature source Tympanic, resp. rate 18, weight 105 lb 14.9 oz (48.1 kg), SpO2 96 %. GENERAL:  Chronically ill appearing thin woman sitting comfortably in the exam room in no acute distress. MENTAL STATUS:  Alert and oriented to person, place and time. HEAD:  Short  hair.  Temporal wasting.  Normocephalic, atraumatic, face symmetric, no Cushingoid features. EYES:  Blue eyes.  Pupils equal round and reactive to light and accomodation.  No conjunctivitis or scleral icterus. ENT:  Oropharynx clear without lesion.  Tongue normal. Mucous membranes moist.  RESPIRATORY:  Wet cough.  Clear to auscultation without rales, wheezes or rhonchi. CARDIOVASCULAR:  Regular rate and rhythm without murmur, rub or gallop. ABDOMEN:  Soft, non-tender, with active bowel sounds, and no hepatosplenomegaly.  No masses. SKIN:  No rashes, ulcers or lesions. EXTREMITIES: No edema, no skin discoloration or tenderness.  No palpable cords. LYMPH NODES: No palpable cervical, supraclavicular, axillary or inguinal adenopathy  NEUROLOGICAL: Unremarkable. PSYCH:  Appropriate.   No visits with results within 3 Day(s) from this visit.  Latest known visit with results is:  Infusion on 06/07/2018  Component Date Value Ref Range Status  . Sodium 06/07/2018 136  135 - 145 mmol/L Final  . Potassium 06/07/2018 3.9  3.5 - 5.1 mmol/L Final  . Chloride 06/07/2018 99  98 - 111 mmol/L Final  . CO2 06/07/2018 28  22 - 32  mmol/L Final  . Glucose, Bld 06/07/2018 103* 70 - 99 mg/dL Final  . BUN 06/07/2018 15  8 - 23 mg/dL Final  . Creatinine, Ser 06/07/2018 0.75  0.44 - 1.00 mg/dL Final  . Calcium 06/07/2018 8.6* 8.9 - 10.3 mg/dL Final  . Total Protein 06/07/2018 6.9  6.5 - 8.1 g/dL Final  . Albumin 06/07/2018 3.5  3.5 - 5.0 g/dL Final  . AST 06/07/2018 20  15 - 41 U/L Final  . ALT 06/07/2018 12  0 - 44 U/L Final  . Alkaline Phosphatase 06/07/2018 108  38 - 126 U/L Final  . Total Bilirubin 06/07/2018 0.4  0.3 - 1.2 mg/dL Final  . GFR  calc non Af Amer 06/07/2018 >60  >60 mL/min Final  . GFR calc Af Amer 06/07/2018 >60  >60 mL/min Final  . Anion gap 06/07/2018 9  5 - 15 Final   Performed at [facility], [address] [place], [place] [address]  . WBC 06/07/2018 10.4  4.0 - 10.5 K/uL Final  . RBC 06/07/2018 4.47  3.87 - 5.11 MIL/uL Final  . Hemoglobin 06/07/2018 13.0  12.0 - 15.0 g/dL Final  . HCT 06/07/2018 40.7  36.0 - 46.0 % Final  . MCV 06/07/2018 91.1  80.0 - 100.0 fL Final  . MCH 06/07/2018 29.1  26.0 - 34.0 pg Final  . MCHC 06/07/2018 31.9  30.0 - 36.0 g/dL Final  . RDW 06/07/2018 14.5  11.5 - 15.5 % Final  . Platelets 06/07/2018 214  150 - 400 K/uL Final  . nRBC 06/07/2018 0.0  0.0 - 0.2 % Final  . Neutrophils Relative % 06/07/2018 72  % Final  . Neutro Abs 06/07/2018 7.4  1.7 - 7.7 K/uL Final  . Lymphocytes Relative 06/07/2018 18  % Final  . Lymphs Abs 06/07/2018 1.9  0.7 - 4.0 K/uL Final  . Monocytes Relative 06/07/2018 9  % Final  . Monocytes Absolute 06/07/2018 0.9  0.1 - 1.0 K/uL Final  . Eosinophils Relative 06/07/2018 1  % Final  . Eosinophils Absolute 06/07/2018 0.1  0.0 - 0.5 K/uL Final  . Basophils Relative 06/07/2018 0  % Final  . Basophils Absolute 06/07/2018 0.0  0.0 - 0.1 K/uL Final  . Immature Granulocytes 06/07/2018 0  % Final  . Abs Immature Granulocytes 06/07/2018 0.04  0.00 - 0.07 K/uL Final   Performed at [facility], [address]  [place], [place] [address]    Assessment:  KIDADA GING is a 73 y.o. female with limited stage small cell lung cancer. She had stage IIIB (T3N2M0) disease.  She received cisplatin and etoposide from 03/05/2017 - 05/14/2017.   She received radiation from  03/08/2017 - 04/23/2017.  Chest, abdomen, and pelvic CT on 05/30/2017 revealed continued improvement in the right upper lobe mass, right hilar and mediastinal lymphadenopathy consistent with response to therapy.  there was interval development of multiple small ill-defined peribronchovascular nodules in both lungs, greatest in the right upper lobe. Some of these were clustered, and were not associated with the bronchovascular bundles such that lymphangitic tumor was unlikely. These were likely inflammatory.   There was no evidence of metastatic disease in the abdomen or pelvis  She received PCI from 06/19/2017 - 07/11/2017.  Chest, abdomen, and pelvic CT on 06/07/2018 revealed interval development of L4 vertebral body burst fracture with mild retropulsion of the posterior aspect of the vertebral body.  There was associated sclerosis of the L4 vertebral body with significant height loss predominately involving the anterior and mid aspect of the vertebral body. Possibility of pathologic fracture was not excluded. There was a nondisplaced right L4 transverse process fracture. Recommend further evaluation with lumbar spine MRI.  There were findings suggestive of mild acute sigmoid colonic diverticulitis.  There was similar to mild interval increase in size of previously described lesion within the right upper lobe (3.1 x 1.8 cm to 3.2 x 2.2 cm).  Adjacent irregular nodule along the inferior margin within the right upper lobe was grossly similar when compared to prior (1.9 x 1.1 cm to 1.2 x 2.0 cm).  Additional areas of consolidation within the right lung were grossly similar.  There was interval  improvement in previously described tree-in-bud nodularity within the  left lower lobe with slight interval increase in ground-glass nodularity within the right lower lobe suggestive of atypical infectious process (MAI).  She has a history of left breast cancer in 2002.  She underwent left mastectomy followed by adjuvant chemotherapy. She was treated by Dr. Oliva Bustard. She did not receive adjuvant radiation.  Symptomatically, she is fatigued.  Weight is down 4 pounds.  She is orthostatic.  Exam reveals general debilitation.  Plan: 1.  Labs today:  CBC with diff, CMP.  2.  Limited stage small cell lung cancer  Review entire medical history, diagnosis and treatment to date.  Discuss interval scans.  Images personally reviewed.  Agree with radiology interpretation.     Lung lesion appears relatively stable.     Concern for burst fracture.  Unclear if related to malignancy. 3.  L4 burst fracture  Etiology unclear.  Patient s/p fall.  Possible pathologic fracture.  Anticipate lumbar spine MRI.  Contact Dr. Rudene Christians for evaluation and treatment- done.  Anticipate biopsy and kyphoplasty.  Patient has appointment with Dr. Rudene Christians today. 4.  Hypotension  Patient is orthostatic today.  IVF today with repeat BP to ensure correction. 5.  RTC in 1 week for MD assessment .   Honor Loh, NP  06/10/2018, 11:52 AM   I saw and evaluated the patient, participating in the key portions of the service and reviewing pertinent diagnostic studies and records.  I reviewed the nurse practitioner's note and agree with the findings and the plan.  The assessment and plan were discussed with the patient.  Multiple questions were asked by the patient and answered.   Nolon Stalls, MD 06/10/2018,11:52 AM

## 2018-06-10 ENCOUNTER — Inpatient Hospital Stay (HOSPITAL_BASED_OUTPATIENT_CLINIC_OR_DEPARTMENT_OTHER): Payer: Medicare HMO | Admitting: Hematology and Oncology

## 2018-06-10 ENCOUNTER — Inpatient Hospital Stay: Payer: Medicare HMO

## 2018-06-10 ENCOUNTER — Ambulatory Visit: Payer: Medicare Other | Admitting: Oncology

## 2018-06-10 ENCOUNTER — Encounter: Payer: Self-pay | Admitting: Hematology and Oncology

## 2018-06-10 ENCOUNTER — Telehealth: Payer: Self-pay

## 2018-06-10 VITALS — BP 80/55 | HR 103 | Temp 97.8°F | Resp 18 | Wt 105.9 lb

## 2018-06-10 DIAGNOSIS — Z923 Personal history of irradiation: Secondary | ICD-10-CM | POA: Diagnosis not present

## 2018-06-10 DIAGNOSIS — I951 Orthostatic hypotension: Secondary | ICD-10-CM

## 2018-06-10 DIAGNOSIS — C3411 Malignant neoplasm of upper lobe, right bronchus or lung: Secondary | ICD-10-CM

## 2018-06-10 DIAGNOSIS — Z9221 Personal history of antineoplastic chemotherapy: Secondary | ICD-10-CM | POA: Diagnosis not present

## 2018-06-10 DIAGNOSIS — S32041A Stable burst fracture of fourth lumbar vertebra, initial encounter for closed fracture: Secondary | ICD-10-CM

## 2018-06-10 DIAGNOSIS — I9589 Other hypotension: Secondary | ICD-10-CM

## 2018-06-10 DIAGNOSIS — Z7189 Other specified counseling: Secondary | ICD-10-CM

## 2018-06-10 DIAGNOSIS — E861 Hypovolemia: Secondary | ICD-10-CM

## 2018-06-10 DIAGNOSIS — R5381 Other malaise: Secondary | ICD-10-CM

## 2018-06-10 MED ORDER — HEPARIN SOD (PORK) LOCK FLUSH 100 UNIT/ML IV SOLN
500.0000 [IU] | Freq: Once | INTRAVENOUS | Status: AC
  Administered 2018-06-10: 500 [IU] via INTRAVENOUS

## 2018-06-10 MED ORDER — CEFAZOLIN SODIUM-DEXTROSE 1-4 GM/50ML-% IV SOLN
1.0000 g | Freq: Once | INTRAVENOUS | Status: AC
  Administered 2018-06-11: 1 g via INTRAVENOUS

## 2018-06-10 MED ORDER — SODIUM CHLORIDE 0.9% FLUSH
10.0000 mL | INTRAVENOUS | Status: DC | PRN
  Administered 2018-06-10: 10 mL via INTRAVENOUS
  Filled 2018-06-10: qty 10

## 2018-06-10 MED ORDER — SODIUM CHLORIDE 0.9 % IV SOLN
Freq: Once | INTRAVENOUS | Status: AC
  Administered 2018-06-10: 12:00:00 via INTRAVENOUS
  Filled 2018-06-10: qty 250

## 2018-06-10 NOTE — Telephone Encounter (Signed)
Contacted Dr. Rudene Christians per Dr. Mike Gip request. Answering service unable to get staff on phone at this time. VM left on nurse line requesting Dr. Rudene Christians contact Dr. Mike Gip.

## 2018-06-10 NOTE — Progress Notes (Signed)
Pt here for f/u. Pt. Reports increase in weakness and is more unstable on her feet x 1 month. Reports occasional dizziness with walking.   Checked Orthos:  Sitting: BP 108/62 HR 99 Standing: BP 80/55 HR 103  Pt. Denies dizziness during orthos.

## 2018-06-11 ENCOUNTER — Encounter: Payer: Self-pay | Admitting: *Deleted

## 2018-06-11 ENCOUNTER — Ambulatory Visit
Admission: RE | Admit: 2018-06-11 | Discharge: 2018-06-11 | Disposition: A | Discharge status: Home / Self Care | Payer: Medicare HMO | Attending: Orthopedic Surgery | Admitting: Orthopedic Surgery

## 2018-06-11 ENCOUNTER — Other Ambulatory Visit: Payer: Self-pay

## 2018-06-11 ENCOUNTER — Encounter
Admission: RE | Disposition: A | Discharge status: Home / Self Care | Payer: Self-pay | Source: Home / Self Care | Attending: Orthopedic Surgery

## 2018-06-11 ENCOUNTER — Ambulatory Visit: Payer: Medicare HMO

## 2018-06-11 ENCOUNTER — Ambulatory Visit: Payer: Medicare HMO | Admitting: Certified Registered"

## 2018-06-11 DIAGNOSIS — E7800 Pure hypercholesterolemia, unspecified: Secondary | ICD-10-CM | POA: Insufficient documentation

## 2018-06-11 DIAGNOSIS — Z85118 Personal history of other malignant neoplasm of bronchus and lung: Secondary | ICD-10-CM | POA: Insufficient documentation

## 2018-06-11 DIAGNOSIS — Z7982 Long term (current) use of aspirin: Secondary | ICD-10-CM | POA: Diagnosis not present

## 2018-06-11 DIAGNOSIS — J449 Chronic obstructive pulmonary disease, unspecified: Secondary | ICD-10-CM | POA: Diagnosis not present

## 2018-06-11 DIAGNOSIS — M4856XA Collapsed vertebra, not elsewhere classified, lumbar region, initial encounter for fracture: Secondary | ICD-10-CM | POA: Diagnosis not present

## 2018-06-11 DIAGNOSIS — Z79899 Other long term (current) drug therapy: Secondary | ICD-10-CM | POA: Diagnosis not present

## 2018-06-11 DIAGNOSIS — Z419 Encounter for procedure for purposes other than remedying health state, unspecified: Secondary | ICD-10-CM

## 2018-06-11 DIAGNOSIS — F17200 Nicotine dependence, unspecified, uncomplicated: Secondary | ICD-10-CM | POA: Insufficient documentation

## 2018-06-11 DIAGNOSIS — Z853 Personal history of malignant neoplasm of breast: Secondary | ICD-10-CM | POA: Insufficient documentation

## 2018-06-11 HISTORY — PX: KYPHOPLASTY: SHX5884

## 2018-06-11 SURGERY — KYPHOPLASTY
Anesthesia: General | Site: Back

## 2018-06-11 MED ORDER — MIDAZOLAM HCL 2 MG/2ML IJ SOLN
INTRAMUSCULAR | Status: DC | PRN
  Administered 2018-06-11 (×2): 1 mg via INTRAVENOUS

## 2018-06-11 MED ORDER — PROPOFOL 10 MG/ML IV BOLUS
INTRAVENOUS | Status: DC | PRN
  Administered 2018-06-11: 20 mg via INTRAVENOUS
  Administered 2018-06-11: 30 mg via INTRAVENOUS
  Administered 2018-06-11: 20 mg via INTRAVENOUS
  Administered 2018-06-11: 10 mg via INTRAVENOUS

## 2018-06-11 MED ORDER — KETAMINE HCL 10 MG/ML IJ SOLN
INTRAMUSCULAR | Status: DC | PRN
  Administered 2018-06-11 (×2): 25 mg via INTRAVENOUS

## 2018-06-11 MED ORDER — IOPAMIDOL (ISOVUE-M 200) INJECTION 41%
INTRAMUSCULAR | Status: AC
  Filled 2018-06-11: qty 10

## 2018-06-11 MED ORDER — PROPOFOL 500 MG/50ML IV EMUL
INTRAVENOUS | Status: DC | PRN
  Administered 2018-06-11: 40 ug/kg/min via INTRAVENOUS

## 2018-06-11 MED ORDER — ONDANSETRON HCL 4 MG PO TABS: 4.0000 mg | ORAL_TABLET | Freq: Four times a day (QID) | ORAL | Status: DC | PRN

## 2018-06-11 MED ORDER — SODIUM CHLORIDE (PF) 0.9 % IJ SOLN
INTRAMUSCULAR | Status: AC
  Filled 2018-06-11: qty 50

## 2018-06-11 MED ORDER — FENTANYL CITRATE (PF) 100 MCG/2ML IJ SOLN
INTRAMUSCULAR | Status: DC | PRN
  Administered 2018-06-11 (×2): 25 ug via INTRAVENOUS

## 2018-06-11 MED ORDER — PROPOFOL 500 MG/50ML IV EMUL
INTRAVENOUS | Status: AC
  Filled 2018-06-11: qty 50

## 2018-06-11 MED ORDER — HYDROCODONE-ACETAMINOPHEN 5-325 MG PO TABS: 1.0000 | ORAL_TABLET | ORAL | Status: DC | PRN

## 2018-06-11 MED ORDER — MIDAZOLAM HCL 2 MG/2ML IJ SOLN
INTRAMUSCULAR | Status: AC
  Filled 2018-06-11: qty 2

## 2018-06-11 MED ORDER — ONDANSETRON HCL 4 MG/2ML IJ SOLN: 4.0000 mg | Freq: Four times a day (QID) | INTRAMUSCULAR | Status: DC | PRN

## 2018-06-11 MED ORDER — HYDROCODONE-ACETAMINOPHEN 7.5-325 MG PO TABS: 1.0000 | ORAL_TABLET | ORAL | 0 refills | Status: DC | PRN

## 2018-06-11 MED ORDER — BUPIVACAINE-EPINEPHRINE (PF) 0.5% -1:200000 IJ SOLN
INTRAMUSCULAR | Status: AC
  Filled 2018-06-11: qty 30

## 2018-06-11 MED ORDER — LIDOCAINE HCL 1 % IJ SOLN
INTRAMUSCULAR | Status: DC | PRN
  Administered 2018-06-11: 20 mL

## 2018-06-11 MED ORDER — FENTANYL CITRATE (PF) 100 MCG/2ML IJ SOLN
INTRAMUSCULAR | Status: AC
  Filled 2018-06-11: qty 2

## 2018-06-11 MED ORDER — IOPAMIDOL (ISOVUE-M 200) INJECTION 41%
INTRAMUSCULAR | Status: DC | PRN
  Administered 2018-06-11: 30 mL

## 2018-06-11 MED ORDER — IOPAMIDOL (ISOVUE-M 200) INJECTION 41%
INTRAMUSCULAR | Status: AC
  Filled 2018-06-11: qty 20

## 2018-06-11 MED ORDER — KETAMINE HCL 50 MG/ML IJ SOLN
INTRAMUSCULAR | Status: AC
  Filled 2018-06-11: qty 10

## 2018-06-11 MED ORDER — IPRATROPIUM-ALBUTEROL 0.5-2.5 (3) MG/3ML IN SOLN
3.0000 mL | Freq: Four times a day (QID) | RESPIRATORY_TRACT | Status: DC
  Administered 2018-06-11: 3 mL via RESPIRATORY_TRACT

## 2018-06-11 MED ORDER — FENTANYL CITRATE (PF) 100 MCG/2ML IJ SOLN
25.0000 ug | INTRAMUSCULAR | Status: DC | PRN
  Administered 2018-06-11 (×2): 25 ug via INTRAVENOUS

## 2018-06-11 MED ORDER — FAMOTIDINE 20 MG PO TABS
20.0000 mg | ORAL_TABLET | Freq: Once | ORAL | Status: AC
  Administered 2018-06-11: 20 mg via ORAL

## 2018-06-11 MED ORDER — LACTATED RINGERS IV SOLN
INTRAVENOUS | Status: DC
  Administered 2018-06-11: 14:00:00 via INTRAVENOUS

## 2018-06-11 MED ORDER — METOCLOPRAMIDE HCL 5 MG/ML IJ SOLN: 5.0000 mg | Freq: Three times a day (TID) | INTRAMUSCULAR | Status: DC | PRN

## 2018-06-11 MED ORDER — LIDOCAINE HCL (PF) 2 % IJ SOLN
INTRAMUSCULAR | Status: AC
  Filled 2018-06-11: qty 10

## 2018-06-11 MED ORDER — METOCLOPRAMIDE HCL 10 MG PO TABS: 5.0000 mg | ORAL_TABLET | Freq: Three times a day (TID) | ORAL | Status: DC | PRN

## 2018-06-11 MED ORDER — FAMOTIDINE 20 MG PO TABS
ORAL_TABLET | ORAL | Status: AC
  Administered 2018-06-11: 20 mg via ORAL
  Filled 2018-06-11: qty 1

## 2018-06-11 MED ORDER — IPRATROPIUM-ALBUTEROL 0.5-2.5 (3) MG/3ML IN SOLN
RESPIRATORY_TRACT | Status: AC
  Filled 2018-06-11: qty 3

## 2018-06-11 MED ORDER — LIDOCAINE HCL (PF) 2 % IJ SOLN
INTRAMUSCULAR | Status: DC | PRN
  Administered 2018-06-11: 40 mg via INTRADERMAL

## 2018-06-11 MED ORDER — ONDANSETRON HCL 4 MG/2ML IJ SOLN: 4.0000 mg | Freq: Once | INTRAMUSCULAR | Status: DC | PRN

## 2018-06-11 MED ORDER — ONDANSETRON HCL 4 MG/2ML IJ SOLN
INTRAMUSCULAR | Status: AC
  Filled 2018-06-11: qty 2

## 2018-06-11 MED ORDER — LIDOCAINE HCL (PF) 1 % IJ SOLN
INTRAMUSCULAR | Status: AC
  Filled 2018-06-11: qty 30

## 2018-06-11 MED ORDER — ONDANSETRON HCL 4 MG/2ML IJ SOLN
INTRAMUSCULAR | Status: DC | PRN
  Administered 2018-06-11: 4 mg via INTRAVENOUS

## 2018-06-11 MED ORDER — SODIUM CHLORIDE 0.9 % IV SOLN: INTRAVENOUS | Status: DC

## 2018-06-11 MED ORDER — HYDROCODONE-ACETAMINOPHEN 7.5-325 MG PO TABS: 1.0000 | ORAL_TABLET | Freq: Once | ORAL | Status: DC

## 2018-06-11 MED ORDER — PROPOFOL 10 MG/ML IV BOLUS
INTRAVENOUS | Status: AC
  Filled 2018-06-11: qty 20

## 2018-06-11 MED ORDER — BUPIVACAINE-EPINEPHRINE (PF) 0.5% -1:200000 IJ SOLN
INTRAMUSCULAR | Status: DC | PRN
  Administered 2018-06-11: 20 mL

## 2018-06-11 MED ORDER — OXYCODONE-ACETAMINOPHEN 7.5-325 MG PO TABS
ORAL_TABLET | ORAL | Status: AC
  Administered 2018-06-11: 1
  Filled 2018-06-11: qty 1

## 2018-06-11 MED ORDER — CEFAZOLIN SODIUM-DEXTROSE 1-4 GM/50ML-% IV SOLN
INTRAVENOUS | Status: AC
  Filled 2018-06-11: qty 50

## 2018-06-11 SURGICAL SUPPLY — 20 items
CEMENT KYPHON CX01A KIT/MIXER (Cement) ×3 IMPLANT
COVER WAND RF STERILE (DRAPES) ×3 IMPLANT
DERMABOND ADVANCED (GAUZE/BANDAGES/DRESSINGS) ×2
DERMABOND ADVANCED .7 DNX12 (GAUZE/BANDAGES/DRESSINGS) ×1 IMPLANT
DEVICE BIOPSY BONE KYPHX (INSTRUMENTS) ×3 IMPLANT
DRAPE C-ARM XRAY 36X54 (DRAPES) ×3 IMPLANT
DURAPREP 26ML APPLICATOR (WOUND CARE) ×3 IMPLANT
GLOVE SURG SYN 9.0  PF PI (GLOVE) ×2
GLOVE SURG SYN 9.0 PF PI (GLOVE) ×1 IMPLANT
GOWN SRG 2XL LVL 4 RGLN SLV (GOWNS) ×1 IMPLANT
GOWN STRL NON-REIN 2XL LVL4 (GOWNS) ×2
GOWN STRL REUS W/ TWL LRG LVL3 (GOWN DISPOSABLE) ×1 IMPLANT
GOWN STRL REUS W/TWL LRG LVL3 (GOWN DISPOSABLE) ×2
KIT OSTEOCOOL BONE ACCESS 10G (MISCELLANEOUS) ×2 IMPLANT
KIT OSTEOCOOL DUAL PROBE 15 (MISCELLANEOUS) ×2 IMPLANT
PACK KYPHOPLASTY (MISCELLANEOUS) ×3 IMPLANT
STRAP SAFETY 5IN WIDE (MISCELLANEOUS) ×3 IMPLANT
TRAY KYPHOPAK 15/3 EXPRESS 1ST (MISCELLANEOUS) ×3 IMPLANT
TRAY KYPHOPAK 20/3 EXPRESS 1ST (MISCELLANEOUS) ×1 IMPLANT
medtronic osteocool kit bone access ×4 IMPLANT

## 2018-06-11 NOTE — H&P (Signed)
Reviewed paper H+P, will be scanned into chart. No changes noted.  

## 2018-06-11 NOTE — Op Note (Signed)
Date  06/11/2018  Time  5:13 pm   PATIENT: Roberta [name]   PRE-OPERATIVE DIAGNOSIS:  closed wedge compression fracture of L4 pathologic   POST-OPERATIVE DIAGNOSIS:  closed wedge compression fracture of L4 pathologic   PROCEDURE:  Procedure(s): KYPHOPLASTY L4 with radiofrequency ablation  SURGEON: Laurene Footman, MD   ASSISTANTS: None   ANESTHESIA:   local and MAC   EBL:  No intake/output data recorded.   BLOOD ADMINISTERED:none   DRAINS: none    LOCAL MEDICATIONS USED:  MARCAINE    and XYLOCAINE    SPECIMEN:   L4 vertebral body multiple bone specimens   DISPOSITION OF SPECIMEN:  Pathology   COUNTS:  YES   TOURNIQUET:  * No tourniquets in log *   IMPLANTS: Bone cement   DICTATION: .Dragon Dictation  patient was brought to the operating room and after adequate anesthesia was obtained the patient was placed prone.  C arm was brought in in good visualization of the affected level obtained on both AP and lateral projections.  After patient identification and timeout procedures were completed, local anesthetic was infiltrated with 10 cc 1% Xylocaine infiltrated subcutaneously.  This is done the area on the right side of the planned approach.  The back was then prepped and draped you sterile manner and repeat timeout procedure carried out.  A spinal needle was brought down to the pedicle on the right and left side of L4 and a 50-50 mix of 1% Xylocaine half percent Sensorcaine with epinephrine total of 20 cc injected.  After allowing this to set a small incision was made and the trocar was advanced into the vertebral body in an transpedicular fashion on both sides.  Biopsy was obtained Drilling was carried out balloon inserted with inflation to  1 cc on the right, 1.5 on the left.  When the cement was appropriate consistency 3 cc were injected into the vertebral body with extravasation, into a prevertebral vein.  Good fill superior to inferior endplates and from right to left sides  along the inferior endplate.  After the cement had set the trochar was removed and permanent C-arm views obtained.  The wound was closed with Dermabond followed by Band-Aid   PLAN OF CARE: Discharge to home after PACU   PATIENT DISPOSITION:  PACU - hemodynamically stable.

## 2018-06-11 NOTE — Anesthesia Postprocedure Evaluation (Signed)
Anesthesia Post Note  Patient: Roberta [name]  Procedure(s) Performed: KYPHOPLASTY L4 BIOPSY WITH RFA (N/A Back)  Patient location during evaluation: PACU Anesthesia Type: General Level of consciousness: awake and alert Pain management: pain level controlled Vital Signs Assessment: post-procedure vital signs reviewed and stable Respiratory status: spontaneous breathing, nonlabored ventilation and respiratory function stable Cardiovascular status: blood pressure returned to baseline and stable Postop Assessment: no apparent nausea or vomiting Anesthetic complications: no     Last Vitals:  Vitals:   06/11/18 1751 06/11/18 1816  BP: (!) 123/51   Pulse: 96   Resp:  18  Temp: 36.8 C 36.8 C  SpO2: 98% 98%    Last Pain:  Vitals:   06/11/18 1812  TempSrc:   PainSc: [place]

## 2018-06-11 NOTE — Anesthesia Preprocedure Evaluation (Signed)
Anesthesia Evaluation  Patient identified by MRN, date of birth, ID band Patient awake    Reviewed: Allergy & Precautions, NPO status , Patient's Chart, lab work & pertinent test results  History of Anesthesia Complications Negative for: history of anesthetic complications  Airway Mallampati: III       Dental   Pulmonary neg sleep apnea, COPD,  COPD inhaler, Current Smoker,           Cardiovascular (-) hypertension(-) Past MI and (-) CHF (-) dysrhythmias (-) Valvular Problems/Murmurs     Neuro/Psych neg Seizures    GI/Hepatic Neg liver ROS, neg GERD  ,  Endo/Other  neg diabetes  Renal/GU negative Renal ROS     Musculoskeletal   Abdominal   Peds  Hematology  (+) anemia ,   Anesthesia Other Findings   Reproductive/Obstetrics                            Anesthesia Physical Anesthesia Plan  ASA: III  Anesthesia Plan: General   Post-op Pain Management:    Induction:   PONV Risk Score and Plan: 2 and Ondansetron and Dexamethasone  Airway Management Planned: Nasal Cannula  Additional Equipment:   Intra-op Plan:   Post-operative Plan:   Informed Consent: I have reviewed the patients History and Physical, chart, labs and discussed the procedure including the risks, benefits and alternatives for the proposed anesthesia with the patient or authorized representative who has indicated his/her understanding and acceptance.       Plan Discussed with:   Anesthesia Plan Comments:         Anesthesia Quick Evaluation

## 2018-06-11 NOTE — Anesthesia Post-op Follow-up Note (Signed)
Anesthesia QCDR form completed.        

## 2018-06-11 NOTE — Discharge Instructions (Signed)
Pain medicine as directed.  Take it easy today and tomorrow, resume normal activities on Thursday.  Remove Band-Aids on Thursday then okay to shower.

## 2018-06-11 NOTE — Progress Notes (Signed)
Pt. Wanted to lay on her side, patient turned herself over To her right side without distress.  Dr. Rudene Christians in to talk with patient, she insists she wants to go home.

## 2018-06-11 NOTE — Transfer of Care (Signed)
Immediate Anesthesia Transfer of Care Note  Patient: Roberta [name]  Procedure(s) Performed: KYPHOPLASTY L4 BIOPSY WITH RFA (N/A Back)  Patient Location: PACU  Anesthesia Type:General  Level of Consciousness: awake and alert   Airway & Oxygen Therapy: Patient Spontanous Breathing  Post-op Assessment: Report given to RN and Post -op Vital signs reviewed and stable  Post vital signs: Reviewed and stable  Last Vitals:  Vitals Value Taken Time  BP 129/99 06/11/2018  5:12 PM  Temp    Pulse 105 06/11/2018  5:12 PM  Resp 21 06/11/2018  5:12 PM  SpO2 99 % 06/11/2018  5:12 PM  Vitals shown include unvalidated device data.  Last Pain:  Vitals:   06/11/18 1311  TempSrc: Temporal  PainSc: 5          Complications: No apparent anesthesia complications

## 2018-06-12 ENCOUNTER — Encounter: Payer: Self-pay | Admitting: Orthopedic Surgery

## 2018-06-13 ENCOUNTER — Other Ambulatory Visit: Payer: Medicare HMO

## 2018-06-13 NOTE — Progress Notes (Signed)
Tumor Board Documentation  Roberta [name] was presented by Dr Janese Banks at our Tumor Board on 06/13/2018, which included representatives from medical oncology, radiation oncology, surgical oncology, surgical, radiology, pathology, navigation, internal medicine, research, nutrition, pulmonology.  Roberta [name] currently presents as a current patient with history of the following treatments: neoadjuvant chemoradiation, surgical intervention(s).  Additionally, we reviewed previous medical and familial history, history of present illness, and recent lab results along with all available histopathologic and imaging studies. The tumor board considered available treatment options and made the following recommendations:   Send path for Kerritin stainingfrom recent Kyphoplasty  The following procedures/referrals were also placed: No orders of the defined types were placed in this encounter.   Clinical Trial Status: not discussed   Staging used: AJCC Stage Group Stage 3B  T2 N2 M0  National site-specific guidelines NCCN were discussed with respect to the case.  Tumor board is a meeting of clinicians from various specialty areas who evaluate and discuss patients for whom a multidisciplinary approach is being considered. Final determinations in the plan of care are those of the provider(s). The responsibility for follow up of recommendations given during tumor board is that of the provider.   Today's extended care, comprehensive team conference, Roberta [name] was not present for the discussion and was not examined.   Multidisciplinary Tumor Board is a multidisciplinary case peer review process.  Decisions discussed in the Multidisciplinary Tumor Board reflect the opinions of the specialists present at the conference without having examined the patient.  Ultimately, treatment and diagnostic decisions rest with the primary provider(s) and the patient.

## 2018-06-14 LAB — SURGICAL PATHOLOGY

## 2018-06-19 ENCOUNTER — Inpatient Hospital Stay (HOSPITAL_BASED_OUTPATIENT_CLINIC_OR_DEPARTMENT_OTHER): Payer: Medicare HMO | Admitting: Hematology and Oncology

## 2018-06-19 ENCOUNTER — Encounter: Payer: Self-pay | Admitting: Hematology and Oncology

## 2018-06-19 VITALS — BP 111/70 | HR 92 | Temp 97.9°F | Wt 105.5 lb

## 2018-06-19 DIAGNOSIS — C3411 Malignant neoplasm of upper lobe, right bronchus or lung: Secondary | ICD-10-CM | POA: Diagnosis not present

## 2018-06-19 DIAGNOSIS — S32041A Stable burst fracture of fourth lumbar vertebra, initial encounter for closed fracture: Secondary | ICD-10-CM

## 2018-06-19 DIAGNOSIS — Z7189 Other specified counseling: Secondary | ICD-10-CM

## 2018-06-19 DIAGNOSIS — R634 Abnormal weight loss: Secondary | ICD-10-CM

## 2018-06-19 DIAGNOSIS — E43 Unspecified severe protein-calorie malnutrition: Secondary | ICD-10-CM

## 2018-06-19 DIAGNOSIS — Z79818 Long term (current) use of other agents affecting estrogen receptors and estrogen levels: Secondary | ICD-10-CM | POA: Diagnosis not present

## 2018-06-19 MED ORDER — MEGESTROL ACETATE 40 MG/ML PO SUSP: 200.0000 mg | Freq: Every day | ORAL | 0 refills | Status: DC

## 2018-06-19 NOTE — Progress Notes (Signed)
[facility] day:  06/19/2018  Chief Complaint: Roberta [name] is a 73 y.o. female with limited stage small cell lung cancer who is seen for 1 week assessment after interval kyphoplasty and biopsy.  HPI:  The patient was last seen in the medical oncology clinic on 06/10/2018 for new patient assessment.  Imaging studies revealed a new burst fracture of L4.  She described a fall in 04/2018.  She was hypotensive.  She received IVF.  She was referred to Dr. Rudene Christians.  She saw Dr. Rudene Christians on 06/10/2018.  Notes reviewed.  She was felt to have a pathologic fracture.  She underwent kyphoplasty of L4 with radiofrequency ablation on 06/11/2018.  Pathology revealed viable bone and trilineage metabolic marrow with reactive changes and focal changes of fracture callus.  IHC stains for pancytokeratin were negative.  There was no evidence of metastatic disease.  Patient is scheduled to follow up with radiation oncology on 07/18/2018.  During the interim, patient has been doing "ok". She remains fatigued. Patient has chronic shortness of breath related to her underlying malignancy and COPD. Patient continues to smoke 0.5 packs of cigarettes per day.  She denies any acute concerns. Patient denies that she has experienced any B symptoms. She denies any interval infections.   Patient advises that she maintains a poor appetite overall. She is not eating well. Weight today is 105 lb 7.8 oz (47.8 kg), which compared to her last visit to the clinic, represents a stable weight. Baseline weight at her time of diagnosis was 150 pounds.   Patient denies pain in the clinic today.   Past Medical History:  Diagnosis Date  . AAA (abdominal aortic aneurysm) ([place])   . Anemia   . Breast cancer ([place]) 2002   left  . COPD (chronic obstructive pulmonary disease) ([place])   . Dyspnea   . Headache   . High cholesterol   . Personal history of chemotherapy   . Small cell lung cancer ([place])    . Tobacco abuse     Past Surgical History:  Procedure Laterality Date  . ABDOMINAL HYSTERECTOMY    . ARTERY BIOPSY Right 12/05/2017   Procedure: BIOPSY TEMPORAL ARTERY;  Surgeon: Margaretha Sheffield, MD;  Location: [facility] ORS;  Service: ENT;  Laterality: Right;  . ENDOBRONCHIAL ULTRASOUND N/A 02/19/2017   Procedure: ENDOBRONCHIAL ULTRASOUND;  Surgeon: Flora Lipps, MD;  Location: [facility] OR[facility];  Service: Cardiopulmonary;  Laterality: N/A;  . ENDOVASCULAR REPAIR/STENT GRAFT N/A 12/06/2016   Procedure: Endovascular Repair/Stent Graft;  Surgeon: Katha Cabal, MD;  Location: [facility];  Service: Cardiovascular;  Laterality: N/A;  . IR FLUORO GUIDE PORT INSERTION RIGHT  03/02/2017  . KYPHOPLASTY N/A 06/11/2018   Procedure: KYPHOPLASTY L4 BIOPSY WITH RFA;  Surgeon: Hessie Knows, MD;  Location: [facility];  Service: Orthopedics;  Laterality: N/A;  . MASTECTOMY Left 2002   with sentinel node     Family History  Problem Relation Age of Onset  . CAD Mother   . Colon cancer Brother   . Breast cancer Neg Hx     Social History:  reports that she has been smoking cigarettes. She has a 25.00 pack-year smoking history. She has never used smokeless tobacco. She reports that she does not drink alcohol or use drugs.  She is a 0.5 ppd smoker; formally 1 ppd smoker since age of 15. 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 Her sister's name is [name].  The patient  is alone today.  Allergies: No Known Allergies  Current Medications: Current Outpatient Medications  Medication Sig Dispense Refill  . acetaminophen (TYLENOL) 325 MG tablet Take 650 mg by mouth every 6 (six) hours as needed for mild pain.     Marland Kitchen albuterol (2.5 MG/3ML) 0.083% NEBU 3 mL, albuterol (5 MG/ML) 0.5% NEBU 0.5 mL Inhale 1 mg into the lungs.    Marland Kitchen albuterol (PROVENTIL HFA;VENTOLIN HFA) 108 (90 Base) MCG/ACT inhaler Inhale 2 puffs into the lungs every 6 (six) hours as needed for wheezing or shortness of breath. 1 Inhaler 1   . aspirin EC 81 MG tablet Take 81 mg by mouth daily.    Marland Kitchen atorvastatin (LIPITOR) 10 MG tablet Take 10 mg by mouth at bedtime.     . fluticasone-salmeterol (ADVAIR HFA) 115-21 MCG/ACT inhaler Inhale 2 puffs into the lungs 2 (two) times daily.    Marland Kitchen lidocaine-prilocaine (EMLA) cream Place small amount of cream over port site when she gets port flushes 30 g 3  . Multiple Vitamin (MULTIVITAMIN) tablet Take 1 tablet by mouth daily.    Marland Kitchen HYDROcodone-acetaminophen (NORCO) 7.5-325 MG tablet Take 1 tablet by mouth every 4 (four) hours as needed for moderate pain. (Patient not taking: Reported on 06/19/2018) 30 tablet 0   No current facility-administered medications for this visit.    Facility-Administered Medications Ordered in Other Visits  Medication Dose Route Frequency Provider Last Rate Last Dose  . sodium chloride flush (NS) 0.9 % injection 10 mL  10 mL Intravenous PRN Sindy Guadeloupe, MD   10 mL at 03/26/17 0948  . sodium chloride flush (NS) 0.9 % injection 10 mL  10 mL Intravenous PRN Sindy Guadeloupe, MD   10 mL at 04/16/17 1006  . sodium chloride flush (NS) 0.9 % injection 10 mL  10 mL Intravenous PRN Sindy Guadeloupe, MD   10 mL at 01/10/18 1410    Review of Systems:  GENERAL:  Feels "ok".  Fatigued.  No fevers, sweats.  Weight stable. PERFORMANCE STATUS (ECOG):  1-2 HEENT:  No visual changes, runny nose, sore throat, mouth sores or tenderness. Lungs: Chronic shortness of breath.  cough.  No hemoptysis. Cardiac:  No chest pain, palpitations, orthopnea, or PND. GI:  No appetite.  No nausea, vomiting, diarrhea, constipation, melena or hematochezia. GU:  No urgency, frequency, dysuria, or hematuria. Musculoskeletal:  s/p L4 kyphoplasty.  Back hurts once in awhile.  No joint pain.  No muscle tenderness. Extremities:  No pain or swelling. Skin:  No rashes or skin changes. Neuro:  No headache, numbness or weakness, balance or coordination issues. Endocrine:  No diabetes, thyroid issues, hot  flashes or night sweats. Psych:  No mood changes, depression or anxiety. Pain:  No focal pain. Review of systems:  All other systems reviewed and found to be negative.   Physical Exam: Blood pressure 111/70, pulse 92, temperature 97.9 F (36.6 C), temperature source Tympanic, weight 105 lb 7.8 oz (47.8 kg), SpO2 96 %. GENERAL:  Chronically fatigued appearing woman sitting comfortably in the exam room in no acute distress. MENTAL STATUS:  Alert and oriented to person, place and time. HEAD:  Short graying hair.  Temporal wasting.  Normocephalic, atraumatic, face symmetric, no Cushingoid features. EYES:  Blue eyes.  Pupils equal round and reactive to light and accomodation.  No conjunctivitis or scleral icterus. ENT:  Oropharynx clear without lesion.  Tongue normal.  Few teeth. Mucous membranes moist.  RESPIRATORY:  Clear to auscultation without rales, wheezes or  rhonchi. CARDIOVASCULAR:  Regular rate and rhythm without murmur, rub or gallop. ABDOMEN:  Soft, non-tender, with active bowel sounds, and no hepatosplenomegaly.  No masses. SKIN:  Eschar back s/p kyphoplasty.  No rashes, ulcers or lesions. EXTREMITIES: No edema, no skin discoloration or tenderness.  No palpable cords. LYMPH NODES: No palpable cervical, supraclavicular, axillary or inguinal adenopathy  NEUROLOGICAL: Unremarkable. PSYCH:  Appropriate.    No visits with results within 3 Day(s) from this visit.  Latest known visit with results is:  Admission on 06/11/2018, Discharged on 06/11/2018  Component Date Value Ref Range Status  . SURGICAL PATHOLOGY 06/11/2018    Final                   Value:Surgical Pathology CASE: [id number] PATIENT: Andreal Banas Surgical Pathology Report     SPECIMEN SUBMITTED: A. L4 bone, biopsy  CLINICAL HISTORY: Pathological fracture, history of lung cancer and breast cancer  PRE-OPERATIVE DIAGNOSIS: Pathological fracture lumbar spine  POST-OPERATIVE DIAGNOSIS: Same as pre  op     DIAGNOSIS: A. BONE, L4 VERTEBRA; BIOPSY: - VIABLE BONE AND TRILINEAGE METABOLIC MARROW WITH REACTIVE CHANGES AND FOCAL CHANGES SUGGESTIVE OF FRACTURE CALLUS. - NO EVIDENCE OF METASTATIC CARCINOMA.  Comment: Immunohistochemical studies for pancytokeratin, with an appropriately reactive control, were performed on both tissue blocks. These studies show no evidence of metastatic carcinoma.  IHC slides were prepared by Launa Grill, [place]. All controls stained appropriately.  This test was developed and its performance characteristics determined by [facility]. It has not been cleared or approved by the [place] Food and Drug Administration.                          The FDA does not require this test to go through premarket FDA review. This test is used for clinical purposes. It should not be regarded as investigational or for research. This laboratory is certified under the Clinical Laboratory Improvement Amendments (CLIA) as qualified to perform high complexity clinical laboratory testing.   GROSS DESCRIPTION: A. Labeled: L4 bone biopsy (pathologic fracture, history of lung cancer and breast cancer) Received: Formalin Tissue fragment(s): 3 Size: Ranging from 0.7 to 1.4 cm in length and 0.3 cm in diameter Description: Needle core biopsy fragments of tan, bony soft tissue. Entirely submitted in cassettes 1-2, following decalcification.   Final Diagnosis performed by Allena Napoleon, MD.   Electronically signed 06/14/2018 9:36:58AM The electronic signature indicates that the named Attending Pathologist has evaluated the specimen  Technical component performed at [facility], [address], [place], [place] [address] Lab: [phone] Dir:                          Rush Farmer, MD, MMM  Professional component performed at [facility], [facility], [place], [place], [place] [address] Lab: [phone] Dir: Dellia Nims. Reuel Derby, MD     Assessment:  ALISSE TUITE is a 73 y.o. female with limited stage small cell lung cancer. She had stage IIIB (T3N2M0) disease.  She received cisplatin and etoposide from 03/05/2017 - 05/14/2017.   She received radiation from  03/08/2017 - 04/23/2017.  Chest, abdomen, and pelvic CT on 05/30/2017 revealed continued improvement in the right upper lobe mass, right hilar and mediastinal lymphadenopathy consistent with response to therapy.  there was interval development of multiple small ill-defined peribronchovascular nodules in both lungs, greatest in the right upper lobe. Some of these were clustered, and were not associated  with the bronchovascular bundles such that lymphangitic tumor was unlikely. These were likely inflammatory.   There was no evidence of metastatic disease in the abdomen or pelvis  She received PCI from 06/19/2017 - 07/11/2017.  Chest, abdomen, and pelvic CT on 06/07/2018 revealed interval development of L4 vertebral body burst fracture with mild retropulsion of the posterior aspect of the vertebral body.  There was associated sclerosis of the L4 vertebral body with significant height loss predominately involving the anterior and mid aspect of the vertebral body. Possibility of pathologic fracture was not excluded. There was a nondisplaced right L4 transverse process fracture. Recommend further evaluation with lumbar spine MRI.  There were findings suggestive of mild acute sigmoid colonic diverticulitis.  There was similar to mild interval increase in size of previously described lesion within the right upper lobe (3.1 x 1.8 cm to 3.2 x 2.2 cm).  Adjacent irregular nodule along the inferior margin within the right upper lobe was grossly similar when compared to prior (1.9 x 1.1 cm to 1.2 x 2.0 cm).  Additional areas of consolidation within the right lung were grossly similar.  There was interval improvement in previously described tree-in-bud nodularity within the left lower lobe with slight interval increase  in ground-glass nodularity within the right lower lobe suggestive of atypical infectious process (MAI).  She underwent kyphoplasty of L4 with radiofrequency ablation on 06/11/2018.  Pathology revealed viable bone and trilineage metabolic marrow with reactive changes and focal changes of fracture callus.  IHC stains for pancytokeratin were negative.  There was no evidence of metastatic disease.  She has a history of left breast cancer in 2002.  She underwent left mastectomy followed by adjuvant chemotherapy. She was treated by Dr. Oliva Bustard. She did not receive adjuvant radiation.  Symptomatically, she remains fatigued.  She has no appetite.  Weight is 105 pounds (baseline 150 pounds).  Exam is stable.  Plan:   1.  Limited stage small cell lung cancer  Patient s/p concurrent chemotherapy and radiation.  Patient s/p PCI.  Pathology from L4 burst fracture revealed no evidence of malignancy.  Discuss follow-up with Dr. Baruch Gouty on 07/18/2018.  Discuss plans for scans in 3 months and possible biopsy if concern for progressive disease. 2.  L4 burst fracture  Etiology unclear.  Patient was s/p fall.  Imaging suggested a possible pathologic fracture.  Initial plan for spine MRI not performed prior to surgery.  Discussed with Dr. Kathlene Cote, radiologist.   Utility of MRI now uncertain, would reserve MRI in future if not improving.   Cancel MRI.  Patient has follow-up appointment with Dr. Baruch Gouty. 3.  Weight loss  Discuss importance of caloric intake.  Encourage supplemental shakes.  Referral to registered dietician.  Discuss consideration of appetite stimulant (Megace).  Potential side effects reviewed.  Patient is agreeable.  Rx:  Megace 200 mg (5 ml) daily (dis: 240 ml). 4.  Port flush every 6 weeks. 5.  RTC in 2 weeks for MD assessment and labs (CBC with diff, BMP, TSH, LDH, CEA).  A total of (25) minutes of face-to-face time was spent with the patient with greater than 50% of that time in  counseling and care-coordination.   Honor Loh, NP  06/19/2018, 1:26 PM   I saw and evaluated the patient, participating in the key portions of the service and reviewing pertinent diagnostic studies and records.  I reviewed the nurse practitioner's note and agree with the findings and the plan.  The assessment and plan were discussed with the  patient.  Multiple questions were asked by the patient and answered.   Nolon Stalls, MD 06/19/2018,1:26 PM

## 2018-06-19 NOTE — Progress Notes (Signed)
Pt here for follow up. Complains of feeling more fatigued than usual x 1-2 weeks. Also states she does not have an appetite 3-4 weeks. Denies any other s/s at this time.

## 2018-06-20 ENCOUNTER — Ambulatory Visit: Payer: Medicare HMO

## 2018-06-24 ENCOUNTER — Inpatient Hospital Stay: Payer: Medicare HMO

## 2018-06-24 NOTE — Progress Notes (Signed)
Nutrition Assessment   Reason for Assessment:  Weight loss   ASSESSMENT:  73 year old female with lung cancer followed by Dr. Mike Gip.  Patient initially received chemotherapy 02/2017-04/2017 and radiation 02/2017-03/2017.  Noted s/p kyphoplasty on 06/11/2018 from pathologic fracture.  Noted planning to see Dr. Debroah Loop on 07/18/2018.  Past medical history of COPD, anemia, tobacco abuse, high cholesterol, breast cancer 2002  Met with patient in clinic this am.  Patient reports that medicine she got for appetite has helped.  Has been taking it for the last 2 days.  Reports that yesterday ate sausage, egg, cheese biscuit (100%), 2 packets of grits and coffee for breakfast (7:30am). At 10 am ate pack of orange crackers with peanut butter and coke.  Lunch ate fried chicken breast, mashed potatoes and gravy and biscuit (100%). Around 2pm had orange and supper was Fish sandwich with fries from [employer] (ate 100%) and sweet tea.  At bedtime ate peanut butter sandwich (2 pieces of bread) and drank whole milk.    Asked patient to describe a day when she was not taking megace and reports it is about the same as what described previously.  Although she does report increased in appetite.  Per notes patient reported poor appetite for the last 3-4 weeks  Patient reports that she prepares meals or son helps her.  Reports she and son do grocery shopping.  Likes ensure but can't afford them.  Denies trouble chewing or swallowing (note poor dentition). Denies nausea, change in bowel habits.     Nutrition Focused Physical Exam: Nutrition-Focused physical exam completed. Findings are moderate orbital,moderate buccal, ,mild upper arm, moderate ribs fat depletion, severe temple,moderate clavicle,moderate shoulder, unable to view scapula, moderate hand, unable to view thigh or knee, mild calf muscle depletion,  Medications: MVI, megace added   Labs: reviewed   Anthropometrics:   Height: 67 inches Weight:  105 lb 7.8 on 1/22 UBW: 145 lb per patient report last at this weight in Sept 2018 BMI: 16 Noted July 111 lb , question accuracy of weight in Dec of 115 lb  5% weight loss in the last 6 months   Estimated Energy Needs  Kcals: 1440-1680 calories Protein: 72-84 g Fluid: 1.6 L/d   NUTRITION DIAGNOSIS: Unintentional weight loss related to likely related to poor intake (from note on 1/22) as evidenced by weight loss, moderate fat depletion and moderate severe muscle mass loss.   MALNUTRITION DIAGNOSIS: unable to determine   INTERVENTION:  Discussed strategies to increase calories and protein. Written information provided to patient. Encouraged trying to drink ensure with am and pm snack.  Provided patient with 1st case of ensure enlive today Contact information provided   MONITORING, EVALUATION, GOAL:  Patient will consume adequate calories to prevent further weight loss  Next Visit: Feb 20 after radiation appointment  Corban Kistler B. Zenia Resides, [place], [place] Registered Dietitian [phone] (pager)

## 2018-07-02 ENCOUNTER — Other Ambulatory Visit: Payer: Self-pay | Admitting: Nurse Practitioner

## 2018-07-04 ENCOUNTER — Inpatient Hospital Stay: Payer: Medicare HMO

## 2018-07-04 ENCOUNTER — Inpatient Hospital Stay: Payer: Medicare HMO | Attending: Hematology and Oncology | Admitting: Hematology and Oncology

## 2018-07-04 NOTE — Progress Notes (Deleted)
[facility] day:  07/04/2018  Chief Complaint: Roberta [name] is a 73 y.o. female with limited stage small cell lung cancer who is seen for 2 week assessment.  HPI:  The patient was last seen in the medical oncology clinic on 06/19/2018.  At that time, she was doing "ok".  She remained fatigued.  She had chronic shortness of breath associated with her COPD.  She was not eating well.  She was referred to the registered dietician.  We discussed an appetite stimulant.  She was prescribed Megace 200 mg a day.  She met with Jennet Maduro, RD on 06/24/2018.  Notes reviewed.  Strategies to increase calories and protein were discussed.  Written information was provided to patient.  She was encouraged trying to drink Ensure with am and pm snack.  She was provided patient with 1st case of Ensure Enlive today.  During the interim,     Past Medical History:  Diagnosis Date  . AAA (abdominal aortic aneurysm) ([place])   . Anemia   . Breast cancer ([place]) 2002   left  . COPD (chronic obstructive pulmonary disease) ([place])   . Dyspnea   . Headache   . High cholesterol   . Personal history of chemotherapy   . Small cell lung cancer ([place])   . Tobacco abuse     Past Surgical History:  Procedure Laterality Date  . ABDOMINAL HYSTERECTOMY    . ARTERY BIOPSY Right 12/05/2017   Procedure: BIOPSY TEMPORAL ARTERY;  Surgeon: Margaretha Sheffield, MD;  Location: [facility] ORS;  Service: ENT;  Laterality: Right;  . ENDOBRONCHIAL ULTRASOUND N/A 02/19/2017   Procedure: ENDOBRONCHIAL ULTRASOUND;  Surgeon: Flora Lipps, MD;  Location: [facility] OR[facility];  Service: Cardiopulmonary;  Laterality: N/A;  . ENDOVASCULAR REPAIR/STENT GRAFT N/A 12/06/2016   Procedure: Endovascular Repair/Stent Graft;  Surgeon: Katha Cabal, MD;  Location: [facility];  Service: Cardiovascular;  Laterality: N/A;  . IR FLUORO GUIDE PORT INSERTION RIGHT  03/02/2017  . KYPHOPLASTY N/A 06/11/2018   Procedure:  KYPHOPLASTY L4 BIOPSY WITH RFA;  Surgeon: Hessie Knows, MD;  Location: [facility] OR[facility];  Service: Orthopedics;  Laterality: N/A;  . MASTECTOMY Left 2002   with sentinel node     Family History  Problem Relation Age of Onset  . CAD Mother   . Colon cancer Brother   . Breast cancer Neg Hx     Social History:  reports that she has been smoking cigarettes. She has a 25.00 pack-year smoking history. She has never used smokeless tobacco. She reports that she does not drink alcohol or use drugs.  She is a 0.5 ppd smoker; formally 1 ppd smoker since age of 32. 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 The patient is accompanied by her sister, [name], today.  Allergies: No Known Allergies  Current Medications: Current Outpatient Medications  Medication Sig Dispense Refill  . acetaminophen (TYLENOL) 325 MG tablet Take 650 mg by mouth every 6 (six) hours as needed for mild pain.     Marland Kitchen albuterol (2.5 MG/3ML) 0.083% NEBU 3 mL, albuterol (5 MG/ML) 0.5% NEBU 0.5 mL Inhale 1 mg into the lungs.    Marland Kitchen albuterol (PROVENTIL HFA;VENTOLIN HFA) 108 (90 Base) MCG/ACT inhaler Inhale 2 puffs into the lungs every 6 (six) hours as needed for wheezing or shortness of breath. 1 Inhaler 1  . aspirin EC 81 MG tablet Take 81 mg by mouth daily.    Marland Kitchen atorvastatin (LIPITOR) 10  MG tablet Take 10 mg by mouth at bedtime.     . fluticasone-salmeterol (ADVAIR HFA) 115-21 MCG/ACT inhaler Inhale 2 puffs into the lungs 2 (two) times daily.    Marland Kitchen HYDROcodone-acetaminophen (NORCO) 7.5-325 MG tablet Take 1 tablet by mouth every 4 (four) hours as needed for moderate pain. (Patient not taking: Reported on 06/19/2018) 30 tablet 0  . megestrol (MEGACE) 40 MG/ML suspension Take 5 mLs (200 mg total) by mouth daily. 240 mL 0  . Multiple Vitamin (MULTIVITAMIN) tablet Take 1 tablet by mouth daily.     No current facility-administered medications for this visit.    Facility-Administered Medications Ordered in Other Visits  Medication  Dose Route Frequency Provider Last Rate Last Dose  . sodium chloride flush (NS) 0.9 % injection 10 mL  10 mL Intravenous PRN Sindy Guadeloupe, MD   10 mL at 03/26/17 0948  . sodium chloride flush (NS) 0.9 % injection 10 mL  10 mL Intravenous PRN Sindy Guadeloupe, MD   10 mL at 04/16/17 1006  . sodium chloride flush (NS) 0.9 % injection 10 mL  10 mL Intravenous PRN Sindy Guadeloupe, MD   10 mL at 01/10/18 1410    Review of Systems:  GENERAL:  Feels good.  Active.  No fevers, sweats or weight loss. PERFORMANCE STATUS (ECOG):  *** HEENT:  No visual changes, runny nose, sore throat, mouth sores or tenderness. Lungs: No shortness of breath or cough.  No hemoptysis. Cardiac:  No chest pain, palpitations, orthopnea, or PND. GI:  No nausea, vomiting, diarrhea, constipation, melena or hematochezia. GU:  No urgency, frequency, dysuria, or hematuria. Musculoskeletal:  No back pain.  No joint pain.  No muscle tenderness. Extremities:  No pain or swelling. Skin:  No rashes or skin changes. Neuro:  No headache, numbness or weakness, balance or coordination issues. Endocrine:  No diabetes, thyroid issues, hot flashes or night sweats. Psych:  No mood changes, depression or anxiety. Pain:  No focal pain. Review of systems:  All other systems reviewed and found to be negative.  Physical Exam: There were no vitals taken for this visit. GENERAL:  Well developed, well nourished, **man sitting comfortably in the exam room in no acute distress. MENTAL STATUS:  Alert and oriented to person, place and time. HEAD:  *** hair.  Normocephalic, atraumatic, face symmetric, no Cushingoid features. EYES:  *** eyes.  Pupils equal round and reactive to light and accomodation.  No conjunctivitis or scleral icterus. ENT:  Oropharynx clear without lesion.  Tongue normal. Mucous membranes moist.  RESPIRATORY:  Clear to auscultation without rales, wheezes or rhonchi. CARDIOVASCULAR:  Regular rate and rhythm without murmur, rub or  gallop. ABDOMEN:  Soft, non-tender, with active bowel sounds, and no hepatosplenomegaly.  No masses. SKIN:  No rashes, ulcers or lesions. EXTREMITIES: No edema, no skin discoloration or tenderness.  No palpable cords. LYMPH NODES: No palpable cervical, supraclavicular, axillary or inguinal adenopathy  NEUROLOGICAL: Unremarkable. PSYCH:  Appropriate.   No visits with results within 3 Day(s) from this visit.  Latest known visit with results is:  Admission on 06/11/2018, Discharged on 06/11/2018  Component Date Value Ref Range Status  . SURGICAL PATHOLOGY 06/11/2018    Final                   Value:Surgical Pathology CASE: [id number] PATIENT: Roberta [name] Surgical Pathology Report     SPECIMEN SUBMITTED: A. L4 bone, biopsy  CLINICAL HISTORY: Pathological fracture, history of lung cancer and breast  cancer  PRE-OPERATIVE DIAGNOSIS: Pathological fracture lumbar spine  POST-OPERATIVE DIAGNOSIS: Same as pre op     DIAGNOSIS: A. BONE, L4 VERTEBRA; BIOPSY: - VIABLE BONE AND TRILINEAGE METABOLIC MARROW WITH REACTIVE CHANGES AND FOCAL CHANGES SUGGESTIVE OF FRACTURE CALLUS. - NO EVIDENCE OF METASTATIC CARCINOMA.  Comment: Immunohistochemical studies for pancytokeratin, with an appropriately reactive control, were performed on both tissue blocks. These studies show no evidence of metastatic carcinoma.  IHC slides were prepared by Launa Grill, [place]. All controls stained appropriately.  This test was developed and its performance characteristics determined by [facility]. It has not been cleared or approved by the [place] Food and Drug Administration.                          The FDA does not require this test to go through premarket FDA review. This test is used for clinical purposes. It should not be regarded as investigational or for research. This laboratory is certified under the Clinical Laboratory Improvement Amendments (CLIA) as qualified to perform high complexity  clinical laboratory testing.   GROSS DESCRIPTION: A. Labeled: L4 bone biopsy (pathologic fracture, history of lung cancer and breast cancer) Received: Formalin Tissue fragment(s): 3 Size: Ranging from 0.7 to 1.4 cm in length and 0.3 cm in diameter Description: Needle core biopsy fragments of tan, bony soft tissue. Entirely submitted in cassettes 1-2, following decalcification.   Final Diagnosis performed by Allena Napoleon, MD.   Electronically signed 06/14/2018 9:36:58AM The electronic signature indicates that the named Attending Pathologist has evaluated the specimen  Technical component performed at [facility], [address], [place], [place] [address] Lab: [phone] Dir:                          Rush Farmer, MD, MMM  Professional component performed at [facility], [facility], [place], [place], [place] [address] Lab: [phone] Dir: Dellia Nims. Reuel Derby, MD     Assessment:  Roberta [name] is a 73 y.o. female with limited stage small cell lung cancer. She had stage IIIB (T3N2M0) disease.  She received cisplatin and etoposide from 03/05/2017 - 05/14/2017.   She received radiation from  03/08/2017 - 04/23/2017.  Chest, abdomen, and pelvic CT on 05/30/2017 revealed continued improvement in the right upper lobe mass, right hilar and mediastinal lymphadenopathy consistent with response to therapy.  there was interval development of multiple small ill-defined peribronchovascular nodules in both lungs, greatest in the right upper lobe. Some of these were clustered, and were not associated with the bronchovascular bundles such that lymphangitic tumor was unlikely. These were likely inflammatory.   There was no evidence of metastatic disease in the abdomen or pelvis  She received PCI from 06/19/2017 - 07/11/2017.  Chest, abdomen, and pelvic CT on 06/07/2018 revealed interval development of L4 vertebral body burst fracture with mild retropulsion of the posterior aspect  of the vertebral body.  There was associated sclerosis of the L4 vertebral body with significant height loss predominately involving the anterior and mid aspect of the vertebral body. Possibility of pathologic fracture was not excluded. There was a nondisplaced right L4 transverse process fracture. Recommend further evaluation with lumbar spine MRI.  There were findings suggestive of mild acute sigmoid colonic diverticulitis.  There was similar to mild interval increase in size of previously described lesion within the right upper lobe (3.1 x 1.8 cm to 3.2 x 2.2 cm).  Adjacent irregular nodule along the inferior  margin within the right upper lobe was grossly similar when compared to prior (1.9 x 1.1 cm to 1.2 x 2.0 cm).  Additional areas of consolidation within the right lung were grossly similar.  There was interval improvement in previously described tree-in-bud nodularity within the left lower lobe with slight interval increase in ground-glass nodularity within the right lower lobe suggestive of atypical infectious process (MAI).  She has a history of left breast cancer in 2002.  She underwent left mastectomy followed by adjuvant chemotherapy. She was treated by Dr. Oliva Bustard. She did not receive adjuvant radiation.  Symptomatically,  Plan:   1.   Labs today:  CBC with diff, BMP, TSH, LDH, CEA.   1.  Limited stage small cell lung cancer:  Review entire medical history, diagnosis and treatment to date.  Discuss interval scans.  Images personally reviewed.  Agree with radiology interpretation.  Lung lesion appears relatively stable.  Concern for burst fracture.  Unclear if related to malignancy. 2.  L4 burst fracture:  Etiology unclear.  Patient s/p fall.  Possible pathologic fracture.  Anticipate lumbar spine MRI.  Contact Dr. Rudene Christians for evaluation and treatment- done.  Anticipate biopsy and kyphoplasty.  Patient has appointment with Dr. Rudene Christians today. 4.  Hypotension:  IVF today. 5.  RTC in 1 week  for MD assessment .   Lequita Asal, MD  07/04/2018, 6:07 AM   I saw and evaluated the patient, participating in the key portions of the service and reviewing pertinent diagnostic studies and records.  I reviewed the nurse practitioner's note and agree with the findings and the plan.  The assessment and plan were discussed with the patient.  Multiple questions were asked by the patient and answered.   Nolon Stalls, MD 07/04/2018,6:07 AM

## 2018-07-07 IMAGING — XA IR FLUORO GUIDE CV LINE*R*
1 series · 1 of 1 positions shown · non-contrast
Comparison: none

CLINICAL DATA: Right-sided small cell lung carcinoma and need for
porta cath to start chemotherapy.

[Series 1: fl - angio · 1 of 1 slices shown]
[im 1/1]
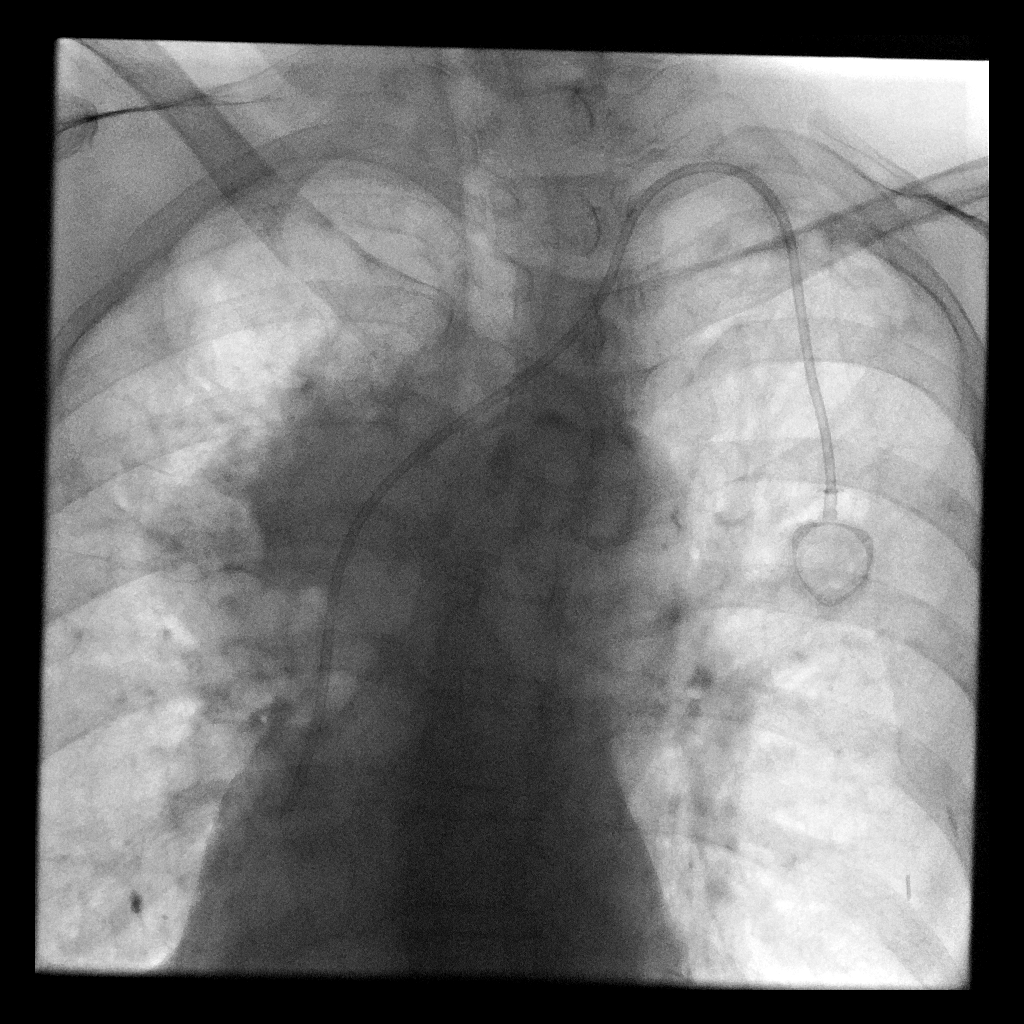

[1 of 1 positions shown; findings below may reference images not displayed]

EXAM:
IMPLANTED PORT A CATH PLACEMENT WITH ULTRASOUND AND FLUOROSCOPIC
GUIDANCE

ANESTHESIA/SEDATION:
2.0 mg IV Versed; 100 mcg IV Fentanyl

Total Moderate Sedation Time:  37 minutes

The patient's level of consciousness and physiologic status were
continuously monitored during the procedure by Radiology nursing.

Additional Medications: 2 g IV Ancef.

FLUOROSCOPY TIME:  18 seconds.  2.0 mGy.

PROCEDURE:
The procedure, risks, benefits, and alternatives were explained to
the patient. Questions regarding the procedure were encouraged and
answered. The patient understands and consents to the procedure. A
time-out was performed prior to initiating the procedure.

Ultrasound was utilized to confirm patency of the left internal
jugular vein. The left neck and chest were prepped with
chlorhexidine in a sterile fashion, and a sterile drape was applied
covering the operative field. Maximum barrier sterile technique with
sterile gowns and gloves were used for the procedure. Local
anesthesia was provided with 1% lidocaine.

After creating a small venotomy incision, a 21 gauge needle was
advanced into the left internal jugular vein under direct, real-time
ultrasound guidance. Ultrasound image documentation was performed.
After securing guidewire access, an 8 Fr dilator was placed. A
J-wire was kinked to measure appropriate catheter length.

A subcutaneous port pocket was then created along the upper chest
wall utilizing sharp and blunt dissection. Portable cautery was
utilized. The pocket was irrigated with sterile saline.

A single lumen power injectable port was chosen for placement. The 8
Fr catheter was tunneled from the port pocket site to the venotomy
incision. The port was placed in the pocket. External catheter was
trimmed to appropriate length based on guidewire measurement.

At the venotomy, an 8 Fr peel-away sheath was placed over a
guidewire. The catheter was then placed through the sheath and the
sheath removed. Final catheter positioning was confirmed and
documented with a fluoroscopic spot image. The port was accessed
with a needle and aspirated and flushed with heparinized saline. The
access needle was removed.

The venotomy and port pocket incisions were closed with subcutaneous
3-0 Monocryl and subcuticular 4-0 Vicryl. Dermabond was applied to
both incisions.

COMPLICATIONS:
COMPLICATIONS
None
FINDINGS: After catheter placement, the tip lies at the Legna junction.
The catheter aspirates normally and is ready for immediate use.
IMPRESSION: Placement of single lumen port a cath via left internal jugular
vein. The catheter tip lies at the Legna junction. A power
injectable port a cath was placed and is ready for immediate use.

## 2018-07-09 ENCOUNTER — Other Ambulatory Visit: Payer: Medicare HMO

## 2018-07-10 ENCOUNTER — Inpatient Hospital Stay: Payer: Medicare HMO | Attending: Hematology and Oncology

## 2018-07-10 ENCOUNTER — Ambulatory Visit: Payer: Medicare HMO | Admitting: Hematology and Oncology

## 2018-07-10 DIAGNOSIS — Z79899 Other long term (current) drug therapy: Secondary | ICD-10-CM | POA: Diagnosis not present

## 2018-07-10 DIAGNOSIS — C3411 Malignant neoplasm of upper lobe, right bronchus or lung: Secondary | ICD-10-CM | POA: Diagnosis not present

## 2018-07-10 DIAGNOSIS — Z853 Personal history of malignant neoplasm of breast: Secondary | ICD-10-CM | POA: Insufficient documentation

## 2018-07-10 DIAGNOSIS — E46 Unspecified protein-calorie malnutrition: Secondary | ICD-10-CM | POA: Insufficient documentation

## 2018-07-10 DIAGNOSIS — Z923 Personal history of irradiation: Secondary | ICD-10-CM | POA: Insufficient documentation

## 2018-07-10 DIAGNOSIS — Z9221 Personal history of antineoplastic chemotherapy: Secondary | ICD-10-CM | POA: Insufficient documentation

## 2018-07-10 DIAGNOSIS — Z955 Presence of coronary angioplasty implant and graft: Secondary | ICD-10-CM | POA: Insufficient documentation

## 2018-07-10 LAB — CBC WITH DIFFERENTIAL/PLATELET
Abs Immature Granulocytes: 0.05 10*3/uL (ref 0.00–0.07)
Basophils Absolute: 0 10*3/uL (ref 0.0–0.1)
Basophils Relative: 0 %
Eosinophils Absolute: 0.1 10*3/uL (ref 0.0–0.5)
Eosinophils Relative: 1 %
HCT: 41.9 % (ref 36.0–46.0)
Hemoglobin: 13.3 g/dL (ref 12.0–15.0)
Immature Granulocytes: 1 %
Lymphocytes Relative: 20 %
Lymphs Abs: 1.9 10*3/uL (ref 0.7–4.0)
MCH: 29.1 pg (ref 26.0–34.0)
MCHC: 31.7 g/dL (ref 30.0–36.0)
MCV: 91.7 fL (ref 80.0–100.0)
Monocytes Absolute: 0.9 10*3/uL (ref 0.1–1.0)
Monocytes Relative: 10 %
Neutro Abs: 6.5 10*3/uL (ref 1.7–7.7)
Neutrophils Relative %: 68 %
Platelets: 237 10*3/uL (ref 150–400)
RBC: 4.57 MIL/uL (ref 3.87–5.11)
RDW: 15.5 % (ref 11.5–15.5)
WBC: 9.5 10*3/uL (ref 4.0–10.5)
nRBC: 0 % (ref 0.0–0.2)

## 2018-07-10 LAB — BASIC METABOLIC PANEL
Anion gap: 9 (ref 5–15)
BUN: 13 mg/dL (ref 8–23)
CO2: 24 mmol/L (ref 22–32)
Calcium: 8.9 mg/dL (ref 8.9–10.3)
Chloride: 106 mmol/L (ref 98–111)
Creatinine, Ser: 0.75 mg/dL (ref 0.44–1.00)
GFR calc Af Amer: >60 mL/min (ref >60)
GFR calc non Af Amer: >60 mL/min (ref >60)
Glucose, Bld: 130 mg/dL — ABNORMAL HIGH (ref 70–99)
Potassium: 3.7 mmol/L (ref 3.5–5.1)
Sodium: 139 mmol/L (ref 135–145)

## 2018-07-10 LAB — LACTATE DEHYDROGENASE: LDH: 142 U/L (ref 98–192)

## 2018-07-10 LAB — TSH: TSH: 1.346 u[IU]/mL (ref 0.350–4.500)

## 2018-07-11 ENCOUNTER — Encounter: Payer: Self-pay | Admitting: Hematology and Oncology

## 2018-07-11 ENCOUNTER — Inpatient Hospital Stay (HOSPITAL_BASED_OUTPATIENT_CLINIC_OR_DEPARTMENT_OTHER): Payer: Medicare HMO | Admitting: Hematology and Oncology

## 2018-07-11 VITALS — BP 93/62 | HR 114 | Temp 97.9°F | Resp 18 | Ht 67.0 in | Wt 106.2 lb

## 2018-07-11 DIAGNOSIS — Z923 Personal history of irradiation: Secondary | ICD-10-CM

## 2018-07-11 DIAGNOSIS — C3411 Malignant neoplasm of upper lobe, right bronchus or lung: Secondary | ICD-10-CM | POA: Diagnosis not present

## 2018-07-11 DIAGNOSIS — Z79899 Other long term (current) drug therapy: Secondary | ICD-10-CM | POA: Diagnosis not present

## 2018-07-11 DIAGNOSIS — Z955 Presence of coronary angioplasty implant and graft: Secondary | ICD-10-CM | POA: Diagnosis not present

## 2018-07-11 DIAGNOSIS — E46 Unspecified protein-calorie malnutrition: Secondary | ICD-10-CM | POA: Diagnosis not present

## 2018-07-11 DIAGNOSIS — Z853 Personal history of malignant neoplasm of breast: Secondary | ICD-10-CM

## 2018-07-11 DIAGNOSIS — Z9221 Personal history of antineoplastic chemotherapy: Secondary | ICD-10-CM

## 2018-07-11 DIAGNOSIS — Z7189 Other specified counseling: Secondary | ICD-10-CM

## 2018-07-11 DIAGNOSIS — E43 Unspecified severe protein-calorie malnutrition: Secondary | ICD-10-CM

## 2018-07-11 LAB — CEA: CEA: 2.3 ng/mL (ref 0.0–4.7)

## 2018-07-11 NOTE — Progress Notes (Signed)
No new changes noted today 

## 2018-07-11 NOTE — Progress Notes (Signed)
Roberta [name] day:  07/11/2018  Chief Complaint: Roberta [name] is a 73 y.o. female with limited stage small cell lung cancer who is seen for 3 week assessment.  HPI:  The patient was last seen in the medical oncology [name] on 06/19/2018.  At that time, she was doing "ok".  She remained fatigued.  She had chronic shortness of breath associated with her COPD.  She was not eating well.  We discussed plans for scan in 3 months.  She was referred to the registered dietician.  We discussed an appetite stimulant.  She was prescribed Megace 200 mg a day.  She met with Roberta [name], RD on 06/24/2018.  Notes reviewed.  Strategies to increase calories and protein were discussed.  Written information was provided to patient.  She was encouraged trying to drink Ensure with am and pm snack.  She was provided patient with 1st case of Ensure Enlive today.  Labs on 07/10/2018 revealed a hematocrit of 41.9, hemoglobin 13.3, MCV 91.7, platelets 237,000, WBC 9500 with an ANC of 6500.  During the interim, she has felt "fairly well".  She is eating some.  She denies any nausea, vomiting or diarrhea.  She met with the dietician and has follow-up with her next week.  She denies any dizziness.  She has a chronic cough.  She denies any pain.     Past Medical History:  Diagnosis Date  . AAA (abdominal aortic aneurysm) ([place])   . Anemia   . Breast cancer ([place]) 2002   left  . COPD (chronic obstructive pulmonary disease) ([place])   . Dyspnea   . Headache   . High cholesterol   . Personal history of chemotherapy   . Small cell lung cancer ([place])   . Tobacco abuse     Past Surgical History:  Procedure Laterality Date  . ABDOMINAL HYSTERECTOMY    . ARTERY BIOPSY Right 12/05/2017   Procedure: BIOPSY TEMPORAL ARTERY;  Surgeon: Margaretha Sheffield, MD;  Location: [facility] ORS;  Service: ENT;  Laterality: Right;  . ENDOBRONCHIAL ULTRASOUND N/A 02/19/2017   Procedure: ENDOBRONCHIAL  ULTRASOUND;  Surgeon: Flora Lipps, MD;  Location: [facility] ORS;  Service: Cardiopulmonary;  Laterality: N/A;  . ENDOVASCULAR REPAIR/STENT GRAFT N/A 12/06/2016   Procedure: Endovascular Repair/Stent Graft;  Surgeon: Katha Cabal, MD;  Location: [facility];  Service: Cardiovascular;  Laterality: N/A;  . IR FLUORO GUIDE PORT INSERTION RIGHT  03/02/2017  . KYPHOPLASTY N/A 06/11/2018   Procedure: KYPHOPLASTY L4 BIOPSY WITH RFA;  Surgeon: Hessie Knows, MD;  Location: [facility] OR[facility];  Service: Orthopedics;  Laterality: N/A;  . MASTECTOMY Left 2002   with sentinel node     Family History  Problem Relation Age of Onset  . CAD Mother   . Colon cancer Brother   . Breast cancer Neg Hx     Social History:  reports that she has been smoking cigarettes. She has a 25.00 pack-year smoking history. She has never used smokeless tobacco. She reports that she does not drink alcohol or use drugs.  She is a 0.5 ppd smoker; formally 1 ppd smoker since age of 41. 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 The patient's sister name is Roberta [name].  She is alone today.  Allergies: No Known Allergies  Current Medications: Current Outpatient Medications  Medication Sig Dispense Refill  . aspirin EC 81 MG tablet Take 81 mg by mouth daily.    Marland Kitchen atorvastatin (LIPITOR) 10  MG tablet Take 10 mg by mouth at bedtime.     . fluticasone-salmeterol (ADVAIR HFA) 115-21 MCG/ACT inhaler Inhale 2 puffs into the lungs 2 (two) times daily.    . Multiple Vitamin (MULTIVITAMIN) tablet Take 1 tablet by mouth daily.    Marland Kitchen acetaminophen (TYLENOL) 325 MG tablet Take 650 mg by mouth every 6 (six) hours as needed for mild pain.     Marland Kitchen albuterol (2.5 MG/3ML) 0.083% NEBU 3 mL, albuterol (5 MG/ML) 0.5% NEBU 0.5 mL Inhale 1 mg into the lungs.    Marland Kitchen albuterol (PROVENTIL HFA;VENTOLIN HFA) 108 (90 Base) MCG/ACT inhaler Inhale 2 puffs into the lungs every 6 (six) hours as needed for wheezing or shortness of breath. 1 Inhaler 1  .  ipratropium (ATROVENT HFA) 17 MCG/ACT inhaler Inhale 2 puffs into the lungs every 6 (six) hours as needed for wheezing. 1 Inhaler 12  . megestrol (MEGACE) 40 MG/ML suspension Take 5 mLs (200 mg total) by mouth daily. 480 mL 0   No current facility-administered medications for this visit.    Facility-Administered Medications Ordered in Other Visits  Medication Dose Route Frequency Provider Last Rate Last Dose  . sodium chloride flush (NS) 0.9 % injection 10 mL  10 mL Intravenous PRN Sindy Guadeloupe, MD   10 mL at 03/26/17 0948  . sodium chloride flush (NS) 0.9 % injection 10 mL  10 mL Intravenous PRN Sindy Guadeloupe, MD   10 mL at 04/16/17 1006  . sodium chloride flush (NS) 0.9 % injection 10 mL  10 mL Intravenous PRN Sindy Guadeloupe, MD   10 mL at 01/10/18 1410    Review of Systems:  GENERAL:  Feels "fairly well".  No fevers, sweats.  Weight up 1 pound. PERFORMANCE STATUS (ECOG):  1-2 HEENT:  No visual changes, runny nose, sore throat, mouth sores or tenderness. Lungs:  COPD.  Chronic shortness of breath.  Chronic cough.  No hemoptysis. Cardiac:  No chest pain, palpitations, orthopnea, or PND. GI:  Eating some.  No nausea, vomiting, diarrhea, constipation, melena or hematochezia. GU:  No urgency, frequency, dysuria, or hematuria. Musculoskeletal:  sp kyphoplasty.  No back pain.  No joint pain.  No muscle tenderness. Extremities:  No pain or swelling. Skin:  No rashes or skin changes. Neuro:  No dizziness.  No headache, numbness or weakness, balance or coordination issues. Endocrine:  No diabetes, thyroid issues, hot flashes or night sweats. Psych:  No mood changes, depression or anxiety. Pain:  No focal pain. Review of systems:  All other systems reviewed and found to be negative.   Physical Exam: Blood pressure 93/62, pulse (!) 114, temperature 97.9 F (36.6 C), temperature source Tympanic, resp. rate 18, height '5\' 7"'  (1.702 m), weight 106 lb 2.4 oz (48.1 kg), SpO2 98 %. GENERAL:  Thin,  chronically fatigued appearing woman sitting comfortably in the exam room in no acute distress. MENTAL STATUS:  Alert and oriented to person, place and time. HEAD:  Short graying hair.  Temporal wasting.  Normocephalic, atraumatic, face symmetric, no Cushingoid features. EYES:  Blue eyes.  Pupils equal round and reactive to light and accomodation.  No conjunctivitis or scleral icterus. ENT:  Oropharynx clear without lesion.  Tongue normal.  Few teeth.  Mucous membranes moist.  RESPIRATORY:  Clear to auscultation without rales, wheezes or rhonchi. CARDIOVASCULAR:  Regular rate and rhythm without murmur, rub or gallop. ABDOMEN:  Soft, non-tender, with active bowel sounds, and no hepatosplenomegaly.  No masses. SKIN:  No rashes,  ulcers or lesions. EXTREMITIES: No edema, no skin discoloration or tenderness.  No palpable cords. LYMPH NODES: No palpable cervical, supraclavicular, axillary or inguinal adenopathy  NEUROLOGICAL: Unremarkable. PSYCH:  Appropriate.    Appointment on 07/10/2018  Component Date Value Ref Range Status  . Sodium 07/10/2018 139  135 - 145 mmol/L Final  . Potassium 07/10/2018 3.7  3.5 - 5.1 mmol/L Final  . Chloride 07/10/2018 106  98 - 111 mmol/L Final  . CO2 07/10/2018 24  22 - 32 mmol/L Final  . Glucose, Bld 07/10/2018 130* 70 - 99 mg/dL Final  . BUN 07/10/2018 13  8 - 23 mg/dL Final  . Creatinine, Ser 07/10/2018 0.75  0.44 - 1.00 mg/dL Final  . Calcium 07/10/2018 8.9  8.9 - 10.3 mg/dL Final  . GFR calc non Af Amer 07/10/2018 >60  >60 mL/min Final  . GFR calc Af Amer 07/10/2018 >60  >60 mL/min Final  . Anion gap 07/10/2018 9  5 - 15 Final   Performed at [facility], [address], [place], [place] [address]  . TSH 07/10/2018 1.346  0.350 - 4.500 uIU/mL Final   Comment: Performed by a 3rd Generation assay with a functional sensitivity of <=0.01 uIU/mL. Performed at [facility], [address] [place], [place] [address]   . LDH 07/10/2018  142  98 - 192 U/L Final   Performed at [facility], [address], [place], [place] [address]  . CEA 07/10/2018 2.3  0.0 - 4.7 ng/mL Final   Comment: (NOTE)                             Nonsmokers          <3.9                             Smokers             <5.6 [employer] Electrochemiluminescence Immunoassay (ECLIA) Values obtained with different assay methods or kits cannot be used interchangeably.  Results cannot be interpreted as absolute evidence of the presence or absence of malignant disease. Performed At: [facility] [place], [place] [id number] Rush Farmer MD [id number]   . WBC 07/10/2018 9.5  4.0 - 10.5 K/uL Final  . RBC 07/10/2018 4.57  3.87 - 5.11 MIL/uL Final  . Hemoglobin 07/10/2018 13.3  12.0 - 15.0 g/dL Final  . HCT 07/10/2018 41.9  36.0 - 46.0 % Final  . MCV 07/10/2018 91.7  80.0 - 100.0 fL Final  . MCH 07/10/2018 29.1  26.0 - 34.0 pg Final  . MCHC 07/10/2018 31.7  30.0 - 36.0 g/dL Final  . RDW 07/10/2018 15.5  11.5 - 15.5 % Final  . Platelets 07/10/2018 237  150 - 400 K/uL Final  . nRBC 07/10/2018 0.0  0.0 - 0.2 % Final  . Neutrophils Relative % 07/10/2018 68  % Final  . Neutro Abs 07/10/2018 6.5  1.7 - 7.7 K/uL Final  . Lymphocytes Relative 07/10/2018 20  % Final  . Lymphs Abs 07/10/2018 1.9  0.7 - 4.0 K/uL Final  . Monocytes Relative 07/10/2018 10  % Final  . Monocytes Absolute 07/10/2018 0.9  0.1 - 1.0 K/uL Final  . Eosinophils Relative 07/10/2018 1  % Final  . Eosinophils Absolute 07/10/2018 0.1  0.0 - 0.5 K/uL Final  . Basophils Relative 07/10/2018 0  % Final  .  Basophils Absolute 07/10/2018 0.0  0.0 - 0.1 K/uL Final  . Immature Granulocytes 07/10/2018 1  % Final  . Abs Immature Granulocytes 07/10/2018 0.05  0.00 - 0.07 K/uL Final   Performed at [facility], [address] [place], [place] [address]    Assessment:  Roberta [name] is a 73 y.o. female with limited stage small cell lung  cancer. She had stage IIIB (T3N2M0) disease.  She received cisplatin and etoposide from 03/05/2017 - 05/14/2017.   She received radiation from  03/08/2017 - 04/23/2017.  Chest, abdomen, and pelvic CT on 05/30/2017 revealed continued improvement in the right upper lobe mass, right hilar and mediastinal lymphadenopathy consistent with response to therapy.  there was interval development of multiple small ill-defined peribronchovascular nodules in both lungs, greatest in the right upper lobe. Some of these were clustered, and were not associated with the bronchovascular bundles such that lymphangitic tumor was unlikely. These were likely inflammatory.   There was no evidence of metastatic disease in the abdomen or pelvis  She received PCI from 06/19/2017 - 07/11/2017.  Chest, abdomen, and pelvic CT on 06/07/2018 revealed interval development of L4 vertebral body burst fracture with mild retropulsion of the posterior aspect of the vertebral body.  There was associated sclerosis of the L4 vertebral body with significant height loss predominately involving the anterior and mid aspect of the vertebral body. Possibility of pathologic fracture was not excluded. There was a nondisplaced right L4 transverse process fracture. Recommend further evaluation with lumbar spine MRI.  There were findings suggestive of mild acute sigmoid colonic diverticulitis.  There was similar to mild interval increase in size of previously described lesion within the right upper lobe (3.1 x 1.8 cm to 3.2 x 2.2 cm).  Adjacent irregular nodule along the inferior margin within the right upper lobe was grossly similar when compared to prior (1.9 x 1.1 cm to 1.2 x 2.0 cm).  Additional areas of consolidation within the right lung were grossly similar.  There was interval improvement in previously described tree-in-bud nodularity within the left lower lobe with slight interval increase in ground-glass nodularity within the right lower lobe  suggestive of atypical infectious process (MAI).  She underwent kyphoplasty of L4 with radiofrequency ablation on 06/11/2018.  Pathology revealed viable bone and trilineage metabolic marrow with reactive changes and focal changes of fracture callus.  IHC stains for pancytokeratin were negative.  There was no evidence of metastatic disease.  CEA was 2.3 and LDH was 142 on 07/10/2018.  She has a history of left breast cancer in 2002.  She underwent left mastectomy followed by adjuvant chemotherapy. She was treated by Dr. Oliva Bustard. She did not receive adjuvant radiation.  Symptomatically, she feels "fairly well".  She remains fatigued.  She has gained 1 pound.  Exam is stable.  Plan:   1.   Review labs from yesterday.  Labs are normal. 2.   Limited stage small cell lung cancer  Patient s/p concurrent chemotherapy and radiation for limited stage disease.  Patient s/p PCI.  Pathology from L4 burst fracture revealed no malignancy.  Patient has follow-up with Dr Baruch Gouty on 07/18/2018.  Discuss plans for upcoming PET scan. 3.   L4 burst fracture s/p kyphoplasty  Etiology unclear.  Patient s/p fall.  Imaging suggested a possible pathologic fracture.  Pre-procedure lumbar spine MRI not performed.  Follow-up with Dr. Baruch Gouty. 4.   Malnutrition  Patient weighed 150 pounds prior to diagnosis.  At last visit, patient weighed 105 pounds.  She met with the registered dietician with follow-up next week.  Encourage caloric intake and supplemental shakes.  Weight is up 1 pound.  RTC in 2 weeks for weight check. 5.   PET scan on 08/12/2018. 6.   RTC after PET scan for MD assessment, labs (CBC with diff, CMP, CEA, LDH), and review of scans.   Lequita Asal, MD  07/11/2018, 3:35 PM

## 2018-07-18 ENCOUNTER — Ambulatory Visit
Admission: RE | Admit: 2018-07-18 | Discharge: 2018-07-18 | Disposition: A | Discharge status: Home / Self Care | Payer: Medicare HMO | Source: Ambulatory Visit | Attending: Radiation Oncology | Admitting: Radiation Oncology

## 2018-07-18 ENCOUNTER — Inpatient Hospital Stay (HOSPITAL_BASED_OUTPATIENT_CLINIC_OR_DEPARTMENT_OTHER): Payer: Medicare HMO

## 2018-07-18 ENCOUNTER — Encounter: Payer: Self-pay | Admitting: Radiation Oncology

## 2018-07-18 ENCOUNTER — Other Ambulatory Visit: Payer: Self-pay | Admitting: *Deleted

## 2018-07-18 ENCOUNTER — Other Ambulatory Visit: Payer: Self-pay

## 2018-07-18 ENCOUNTER — Inpatient Hospital Stay: Payer: Medicare HMO

## 2018-07-18 VITALS — BP 106/60 | HR 80 | Temp 97.8°F | Resp 18 | Wt 105.9 lb

## 2018-07-18 DIAGNOSIS — C3411 Malignant neoplasm of upper lobe, right bronchus or lung: Secondary | ICD-10-CM | POA: Diagnosis not present

## 2018-07-18 DIAGNOSIS — Z853 Personal history of malignant neoplasm of breast: Secondary | ICD-10-CM

## 2018-07-18 DIAGNOSIS — C3401 Malignant neoplasm of right main bronchus: Secondary | ICD-10-CM | POA: Insufficient documentation

## 2018-07-18 DIAGNOSIS — F17210 Nicotine dependence, cigarettes, uncomplicated: Secondary | ICD-10-CM | POA: Insufficient documentation

## 2018-07-18 DIAGNOSIS — Z955 Presence of coronary angioplasty implant and graft: Secondary | ICD-10-CM

## 2018-07-18 DIAGNOSIS — Z923 Personal history of irradiation: Secondary | ICD-10-CM | POA: Insufficient documentation

## 2018-07-18 DIAGNOSIS — Z79899 Other long term (current) drug therapy: Secondary | ICD-10-CM

## 2018-07-18 DIAGNOSIS — E46 Unspecified protein-calorie malnutrition: Secondary | ICD-10-CM | POA: Diagnosis not present

## 2018-07-18 MED ORDER — IPRATROPIUM BROMIDE HFA 17 MCG/ACT IN AERS: 2.0000 | INHALATION_SPRAY | Freq: Four times a day (QID) | RESPIRATORY_TRACT | 12 refills | Status: DC | PRN

## 2018-07-18 NOTE — Progress Notes (Signed)
Survivorship Care Plan visit completed.  Treatment summary reviewed and given to patient.  ASCO answers booklet reviewed and given to patient.  CARE program and Cancer Transitions discussed with patient along with other resources cancer center offers to patients and caregivers.  Patient verbalized understanding.    

## 2018-07-18 NOTE — Progress Notes (Signed)
Radiation Oncology Follow up Note  Name: Roberta [name]   Date:   07/18/2018 MRN:  [id number] DOB: [birth year]    This 73 y.o. female presents to the clinic today for 50 month follow-up status post concurrent chemotherapy radiation therapy for small cell limited stage lung cancer as well as lemon month follow-up for PCI.  REFERRING PROVIDER: Gayland Curry, MD  HPI: Roberta [name] is a 73 year old female now out close to year having completed PCI as well as previously receiving concurrent chemoradiation therapy for limited stage small cell lung cancer. She is seen today in routine follow-up is doing fairly well. She continues to have issues with weight although her weight seems to of stabilized..she is asking her refill one of her inhalers which will be done. She's been on Megace which is helpful some of her appetite stimulation.she did have a burst fracture of L4 which was underwent biopsy keep kyphoplasty with pathology benign showing no evidence of malignancy.recent CT scan performed in January showed similar to mild interval increase in size of previous described lesion within the right upper lobe. Also interval improvement in previous described tree-in-bud nodularity within the left lower lobe.She specifically denies hemoptysis cough she does have significantshortness of breath and dyspnea on exertion. COMPLICATIONS OF TREATMENT: none  FOLLOW UP COMPLIANCE: keeps appointments   PHYSICAL EXAM:  BP 106/60   Pulse 80   Temp 97.8 F (36.6 C)   Resp 18   Wt 105 lb 14.9 oz (48.1 kg)   BMI 16.59 kg/m  Thin female in NAD.Well-developed well-nourished patient in NAD. HEENT reveals PERLA, EOMI, discs not visualized.  Oral cavity is clear. No oral mucosal lesions are identified. Neck is clear without evidence of cervical or supraclavicular adenopathy. Lungs are clear to A&P. Cardiac examination is essentially unremarkable with regular rate and rhythm without murmur rub or thrill. Abdomen is benign  with no organomegaly or masses noted. Motor sensory and DTR levels are equal and symmetric in the upper and lower extremities. Cranial nerves II through XII are grossly intact. Proprioception is intact. No peripheral adenopathy or edema is identified. No motor or sensory levels are noted. Crude visual fields are within normal range.  RADIOLOGY RESULTS: CT scans are reviewed and compatible with the above-stated findings  PLAN: present time patient is stable. She continues close follow-up care with medical oncology. Megace seems to be stimulating her appetite. We are refilling her inhalers as requested. I have asked to see her back in 6 months for follow-up. Patient is to call sooner with any concerns.  I would like to take this opportunity to thank you for allowing me to participate in the care of your patient.Noreene Filbert, MD

## 2018-07-18 NOTE — Progress Notes (Addendum)
Nutrition Follow-up:  Patient with lung cancer followed by Dr Mike Gip.    Met with patient following follow-up with Dr. Baruch Gouty today.  Patient reports that appetite is better. "They gave me a medicine to help my appetite and I think it is working."  Reports this am ate 2 strips bacon, 1 egg, grits, toast, coffee.  Yesterday for dinner had chicken sandwich with mayo, broccoli salad and chocolate pie with tea.  Lunch yesterday was tomato soup (1/2 can), 100% grilled cheese sandwich and fruit cocktail with tea.  Reports that she drinks 2 ensure per day.  Patient consuming about 2120 calories and 74 g of protein per reported intake.  Denies trouble chewing, swallowing, diarrhea, constipation.    Son helps her prepare meals.  Medications: reviewed  Labs: labs reviewed from 2/12  Anthropometrics:   Weight today in radiation was 105 lb 14.4 oz per RN, Maudie Mercury.   Last weight on 1/22 was 105 lb 7.8 Weight on 2/13 was 106 lb 2.4 oz.    Overall stable weight   NUTRITION DIAGNOSIS: Unintentional weight loss stable   INTERVENTION:  Provided suggestions on ways to add calories to current eating pattern (adding cheese to toast, eggs, etc) Recommend patient add bedtime snack for increased calories and protein Continue ensure BID or TID (if wants to drink at bedtime snack. 2nd case of ensure enlive given to patient today, along with coupons    MONITORING, EVALUATION, GOAL: patient will consume adequate calories to prevent further weight loss   NEXT VISIT: March 26   Thomson Herbers B. Zenia Resides, [place], [place] Registered Dietitian [phone] (pager)

## 2018-07-25 ENCOUNTER — Inpatient Hospital Stay (HOSPITAL_BASED_OUTPATIENT_CLINIC_OR_DEPARTMENT_OTHER): Payer: Medicare HMO | Admitting: Hematology and Oncology

## 2018-07-25 ENCOUNTER — Encounter: Payer: Self-pay | Admitting: Hematology and Oncology

## 2018-07-25 VITALS — BP 114/79 | HR 94 | Temp 98.0°F | Resp 22 | Wt 108.9 lb

## 2018-07-25 DIAGNOSIS — Z9221 Personal history of antineoplastic chemotherapy: Secondary | ICD-10-CM

## 2018-07-25 DIAGNOSIS — Z923 Personal history of irradiation: Secondary | ICD-10-CM

## 2018-07-25 DIAGNOSIS — Z853 Personal history of malignant neoplasm of breast: Secondary | ICD-10-CM

## 2018-07-25 DIAGNOSIS — Z79899 Other long term (current) drug therapy: Secondary | ICD-10-CM | POA: Diagnosis not present

## 2018-07-25 DIAGNOSIS — Z955 Presence of coronary angioplasty implant and graft: Secondary | ICD-10-CM | POA: Diagnosis not present

## 2018-07-25 DIAGNOSIS — C3411 Malignant neoplasm of upper lobe, right bronchus or lung: Secondary | ICD-10-CM

## 2018-07-25 DIAGNOSIS — Z7189 Other specified counseling: Secondary | ICD-10-CM

## 2018-07-25 DIAGNOSIS — E46 Unspecified protein-calorie malnutrition: Secondary | ICD-10-CM | POA: Diagnosis not present

## 2018-07-25 DIAGNOSIS — E43 Unspecified severe protein-calorie malnutrition: Secondary | ICD-10-CM

## 2018-07-25 MED ORDER — MEGESTROL ACETATE 40 MG/ML PO SUSP: 200.0000 mg | Freq: Every day | ORAL | 0 refills | Status: DC

## 2018-07-25 NOTE — Progress Notes (Signed)
Pt here for follow up Denies any concerns. Reports "I feel better than I did"

## 2018-07-25 NOTE — Progress Notes (Signed)
[facility] day:  07/25/2018  Chief Complaint: Roberta [name] is a 73 y.o. female with limited stage small cell lung cancer who is seen for weight check and 2 week assessment.  HPI:  The patient was last seen in the medical oncology clinic on 07/11/2018.  At that time, she felt "fairly well".   Appetite had improved on Megace.  Weight was up 1 pound.  She met with the dietician, Jennet Maduro, on 07/18/2018.  Notes reviewed. She provided suggestions on ways to add calories to current eating pattern (adding cheese to toast, eggs, etc).  Recommend patient add bedtime snack for increased calories and protein.  She was to continue Ensure BID or TID.  She was seen by Dr. Baruch Gouty on 07/18/2018.  Notes reviewed.  She was doing well.  She was scheduled for 6 month follow-up.  During the interim, she has felt well. She has a persistent chronic cough.  Her back pain has improved. Patient denies that she has experienced any B symptoms. She denies any interval infections.   Patient advises that she maintains an adequate appetite.  She is drinking Ensure TID.  She is eating well with the daily use of the prescribed appetite stimulant (Megace). Weight today is 108 lb 14.5 oz (49.4 kg), which compared to her last visit to the clinic, represents a 2 pound increase.    Patient denies pain in the clinic today.   Past Medical History:  Diagnosis Date  . AAA (abdominal aortic aneurysm) ([place])   . Anemia   . Breast cancer ([place]) 2002   left  . COPD (chronic obstructive pulmonary disease) ([place])   . Dyspnea   . Headache   . High cholesterol   . Personal history of chemotherapy   . Small cell lung cancer ([place])   . Tobacco abuse     Past Surgical History:  Procedure Laterality Date  . ABDOMINAL HYSTERECTOMY    . ARTERY BIOPSY Right 12/05/2017   Procedure: BIOPSY TEMPORAL ARTERY;  Surgeon: Margaretha Sheffield, MD;  Location: [facility] ORS;  Service: ENT;  Laterality: Right;   . ENDOBRONCHIAL ULTRASOUND N/A 02/19/2017   Procedure: ENDOBRONCHIAL ULTRASOUND;  Surgeon: Flora Lipps, MD;  Location: [facility] OR[facility];  Service: Cardiopulmonary;  Laterality: N/A;  . ENDOVASCULAR REPAIR/STENT GRAFT N/A 12/06/2016   Procedure: Endovascular Repair/Stent Graft;  Surgeon: Katha Cabal, MD;  Location: [facility];  Service: Cardiovascular;  Laterality: N/A;  . IR FLUORO GUIDE PORT INSERTION RIGHT  03/02/2017  . KYPHOPLASTY N/A 06/11/2018   Procedure: KYPHOPLASTY L4 BIOPSY WITH RFA;  Surgeon: Hessie Knows, MD;  Location: [facility];  Service: Orthopedics;  Laterality: N/A;  . MASTECTOMY Left 2002   with sentinel node     Family History  Problem Relation Age of Onset  . CAD Mother   . Colon cancer Brother   . Breast cancer Neg Hx     Social History:  reports that she has been smoking cigarettes. She has a 25.00 pack-year smoking history. She has never used smokeless tobacco. She reports that she does not drink alcohol or use drugs.  She is a 0.5 ppd smoker; formally 1 ppd smoker since age of 73. 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 The patient's sister's name is [name].  Allergies: No Known Allergies  Current Medications: Current Outpatient Medications  Medication Sig Dispense Refill  . acetaminophen (TYLENOL) 325 MG tablet Take 650 mg by mouth every 6 (six) hours  as needed for mild pain.     Marland Kitchen albuterol (2.5 MG/3ML) 0.083% NEBU 3 mL, albuterol (5 MG/ML) 0.5% NEBU 0.5 mL Inhale 1 mg into the lungs.    Marland Kitchen albuterol (PROVENTIL HFA;VENTOLIN HFA) 108 (90 Base) MCG/ACT inhaler Inhale 2 puffs into the lungs every 6 (six) hours as needed for wheezing or shortness of breath. 1 Inhaler 1  . aspirin EC 81 MG tablet Take 81 mg by mouth daily.    Marland Kitchen atorvastatin (LIPITOR) 10 MG tablet Take 10 mg by mouth at bedtime.     . fluticasone-salmeterol (ADVAIR HFA) 115-21 MCG/ACT inhaler Inhale 2 puffs into the lungs 2 (two) times daily.    Marland Kitchen ipratropium (ATROVENT HFA) 17  MCG/ACT inhaler Inhale 2 puffs into the lungs every 6 (six) hours as needed for wheezing. 1 Inhaler 12  . megestrol (MEGACE) 40 MG/ML suspension Take 5 mLs (200 mg total) by mouth daily. 480 mL 0  . Multiple Vitamin (MULTIVITAMIN) tablet Take 1 tablet by mouth daily.     No current facility-administered medications for this visit.    Facility-Administered Medications Ordered in Other Visits  Medication Dose Route Frequency Provider Last Rate Last Dose  . sodium chloride flush (NS) 0.9 % injection 10 mL  10 mL Intravenous PRN Sindy Guadeloupe, MD   10 mL at 03/26/17 0948  . sodium chloride flush (NS) 0.9 % injection 10 mL  10 mL Intravenous PRN Sindy Guadeloupe, MD   10 mL at 04/16/17 1006  . sodium chloride flush (NS) 0.9 % injection 10 mL  10 mL Intravenous PRN Sindy Guadeloupe, MD   10 mL at 01/10/18 1410    Review of Systems:  GENERAL:  Feels better.  No fevers, sweats.  Weight up 2 pounds. PERFORMANCE STATUS (ECOG):  1-2 HEENT:  No visual changes, runny nose, sore throat, mouth sores or tenderness. Lungs: COPD.  Chronic shortness of breath.  Chronic cough.  No hemoptysis. Cardiac:  No chest pain, palpitations, orthopnea, or PND. GI:  Eating better.  Hungry.  No nausea, vomiting, diarrhea, constipation, melena or hematochezia. GU:  No urgency, frequency, dysuria, or hematuria. Musculoskeletal:  s/p kyphoplasty.  No back pain.  No joint pain.  No muscle tenderness. Extremities:  No pain or swelling. Skin:  No rashes or skin changes. Neuro:  No headache, numbness or weakness, balance or coordination issues. Endocrine:  No diabetes, thyroid issues, hot flashes or night sweats. Psych:  No mood changes, depression or anxiety. Pain:  No focal pain. Review of systems:  All other systems reviewed and found to be negative.  Physical Exam: Blood pressure 114/79, pulse 94, temperature 98 F (36.7 C), temperature source Oral, resp. rate (!) 22, weight 108 lb 14.5 oz (49.4 kg), SpO2 99 %. GENERAL:   Thin chronically fatigued appearing woman sitting comfortably in the exam room in no acute distress. MENTAL STATUS:  Alert and oriented to person, place and time. HEAD:  Graying hair.  Normocephalic, atraumatic, face symmetric, no Cushingoid features. EYES:  Blue eyes.  Pupils equal round and reactive to light and accomodation.  No conjunctivitis or scleral icterus. ENT:  Oropharynx clear without lesion.  Tongue normal.  Few teeth.  Mucous membranes moist.  RESPIRATORY:  Clear to auscultation without rales, wheezes or rhonchi. CARDIOVASCULAR:  Regular rate and rhythm without murmur, rub or gallop. ABDOMEN:  Soft, non-tender, with active bowel sounds, and no hepatosplenomegaly.  No masses. SKIN:  No rashes, ulcers or lesions. EXTREMITIES: No edema, no skin discoloration  or tenderness.  No palpable cords. LYMPH NODES: No palpable cervical, supraclavicular, axillary or inguinal adenopathy  NEUROLOGICAL: Unremarkable. PSYCH:  Appropriate.    No visits with results within 3 Day(s) from this visit.  Latest known visit with results is:  Appointment on 07/10/2018  Component Date Value Ref Range Status  . Sodium 07/10/2018 139  135 - 145 mmol/L Final  . Potassium 07/10/2018 3.7  3.5 - 5.1 mmol/L Final  . Chloride 07/10/2018 106  98 - 111 mmol/L Final  . CO2 07/10/2018 24  22 - 32 mmol/L Final  . Glucose, Bld 07/10/2018 130* 70 - 99 mg/dL Final  . BUN 07/10/2018 13  8 - 23 mg/dL Final  . Creatinine, Ser 07/10/2018 0.75  0.44 - 1.00 mg/dL Final  . Calcium 07/10/2018 8.9  8.9 - 10.3 mg/dL Final  . GFR calc non Af Amer 07/10/2018 >60  >60 mL/min Final  . GFR calc Af Amer 07/10/2018 >60  >60 mL/min Final  . Anion gap 07/10/2018 9  5 - 15 Final   Performed at [facility], [address], [place], [place] [address]  . TSH 07/10/2018 1.346  0.350 - 4.500 uIU/mL Final   Comment: Performed by a 3rd Generation assay with a functional sensitivity of <=0.01 uIU/mL. Performed at [facility], [address] [place], [place] [address]   . LDH 07/10/2018 142  98 - 192 U/L Final   Performed at [facility], [address], [place], [place] [address]  . CEA 07/10/2018 2.3  0.0 - 4.7 ng/mL Final   Comment: (NOTE)                             Nonsmokers          <3.9                             Smokers             <5.6 [employer] Electrochemiluminescence Immunoassay (ECLIA) Values obtained with different assay methods or kits cannot be used interchangeably.  Results cannot be interpreted as absolute evidence of the presence or absence of malignant disease. Performed At: [facility] [place], [place] [id number] Rush Farmer MD EL:[id number]   . WBC 07/10/2018 9.5  4.0 - 10.5 K/uL Final  . RBC 07/10/2018 4.57  3.87 - 5.11 MIL/uL Final  . Hemoglobin 07/10/2018 13.3  12.0 - 15.0 g/dL Final  . HCT 07/10/2018 41.9  36.0 - 46.0 % Final  . MCV 07/10/2018 91.7  80.0 - 100.0 fL Final  . MCH 07/10/2018 29.1  26.0 - 34.0 pg Final  . MCHC 07/10/2018 31.7  30.0 - 36.0 g/dL Final  . RDW 07/10/2018 15.5  11.5 - 15.5 % Final  . Platelets 07/10/2018 237  150 - 400 K/uL Final  . nRBC 07/10/2018 0.0  0.0 - 0.2 % Final  . Neutrophils Relative % 07/10/2018 68  % Final  . Neutro Abs 07/10/2018 6.5  1.7 - 7.7 K/uL Final  . Lymphocytes Relative 07/10/2018 20  % Final  . Lymphs Abs 07/10/2018 1.9  0.7 - 4.0 K/uL Final  . Monocytes Relative 07/10/2018 10  % Final  . Monocytes Absolute 07/10/2018 0.9  0.1 - 1.0 K/uL Final  . Eosinophils Relative 07/10/2018 1  % Final  . Eosinophils Absolute 07/10/2018 0.1  0.0 - 0.5 K/uL Final  .  Basophils Relative 07/10/2018 0  % Final  . Basophils Absolute 07/10/2018 0.0  0.0 - 0.1 K/uL Final  . Immature Granulocytes 07/10/2018 1  % Final  . Abs Immature Granulocytes 07/10/2018 0.05  0.00 - 0.07 K/uL Final   Performed at [facility], [address] [place], [place] [address]     Assessment:  Roberta [name] is a 73 y.o. female with limited stage small cell lung cancer. She had stage IIIB (T3N2M0) disease.  She received cisplatin and etoposide from 03/05/2017 - 05/14/2017.   She received radiation from  03/08/2017 - 04/23/2017.  Chest, abdomen, and pelvic CT on 05/30/2017 revealed continued improvement in the right upper lobe mass, right hilar and mediastinal lymphadenopathy consistent with response to therapy.  there was interval development of multiple small ill-defined peribronchovascular nodules in both lungs, greatest in the right upper lobe. Some of these were clustered, and were not associated with the bronchovascular bundles such that lymphangitic tumor was unlikely. These were likely inflammatory.   There was no evidence of metastatic disease in the abdomen or pelvis  She received PCI from 06/19/2017 - 07/11/2017.  Chest, abdomen, and pelvic CT on 06/07/2018 revealed interval development of L4 vertebral body burst fracture with mild retropulsion of the posterior aspect of the vertebral body.  There was associated sclerosis of the L4 vertebral body with significant height loss predominately involving the anterior and mid aspect of the vertebral body. Possibility of pathologic fracture was not excluded. There was a nondisplaced right L4 transverse process fracture. Recommend further evaluation with lumbar spine MRI.  There were findings suggestive of mild acute sigmoid colonic diverticulitis.  There was similar to mild interval increase in size of previously described lesion within the right upper lobe (3.1 x 1.8 cm to 3.2 x 2.2 cm).  Adjacent irregular nodule along the inferior margin within the right upper lobe was grossly similar when compared to prior (1.9 x 1.1 cm to 1.2 x 2.0 cm).  Additional areas of consolidation within the right lung were grossly similar.  There was interval improvement in previously described tree-in-bud nodularity within the left lower lobe  with slight interval increase in ground-glass nodularity within the right lower lobe suggestive of atypical infectious process (MAI).  She underwent kyphoplasty of L4 with radiofrequency ablation on 06/11/2018.  Pathology revealed viable bone and trilineage metabolic marrow with reactive changes and focal changes of fracture callus.  IHC stains for pancytokeratin were negative.  There was no evidence of metastatic disease.  She has a history of left breast cancer in 2002.  She underwent left mastectomy followed by adjuvant chemotherapy. She was treated by Dr. Oliva Bustard. She did not receive adjuvant radiation.  Symptomatically, she is feeling better.  Weight is up 2 pounds in 2 weeks.  Exam is stable.  Plan:   1.  Limited stage small cell lung cancer  Patient is s/p concurrent cisplatin and etoposide with concurrent radiation for limited stage disease.  Patient s/p PCI  Restaging PET scan scheduled for 08/12/2018. 2.  L4 burst fracture s/p kyphoplasty  Etiology unclear as patient s/p fall.  Biopsy at time of kyphoplasty revealed no evidence of disease.  Patient seen by radiation oncology on 07/18/2018.  No radiation scheduled.  Patient has follow-up with radiation oncology in 6 months. 3.  Weight loss  Baseline weight 150 pounds prior to diagnosis.  Nadir weight 105 pounds.  Patient eating well and gaining weight.  Continue supplemental shakes.  Refill Megace (dis: 480 ml). 4.  RTC on 08/14/2018 for MD assessment, labs (CBC with diff, CMP, CEA, LDH), and review of PET scan.   Honor Loh, NP  07/25/2018, 9:35 AM   I saw and evaluated the patient, participating in the key portions of the service and reviewing pertinent diagnostic studies and records.  I reviewed the nurse practitioner's note and agree with the findings and the plan.  The assessment and plan were discussed with the patient.  A few questions were asked by the patient and answered.   Nolon Stalls, MD 07/25/2018,9:35 AM

## 2018-07-31 ENCOUNTER — Inpatient Hospital Stay: Payer: Medicare HMO | Attending: Urgent Care

## 2018-07-31 VITALS — BP 88/63 | HR 105 | Temp 94.9°F | Resp 20

## 2018-07-31 DIAGNOSIS — R0982 Postnasal drip: Secondary | ICD-10-CM | POA: Diagnosis not present

## 2018-07-31 DIAGNOSIS — J3489 Other specified disorders of nose and nasal sinuses: Secondary | ICD-10-CM | POA: Insufficient documentation

## 2018-07-31 DIAGNOSIS — E441 Mild protein-calorie malnutrition: Secondary | ICD-10-CM | POA: Insufficient documentation

## 2018-07-31 DIAGNOSIS — E46 Unspecified protein-calorie malnutrition: Secondary | ICD-10-CM | POA: Insufficient documentation

## 2018-07-31 DIAGNOSIS — Z7982 Long term (current) use of aspirin: Secondary | ICD-10-CM | POA: Insufficient documentation

## 2018-07-31 DIAGNOSIS — Z95828 Presence of other vascular implants and grafts: Secondary | ICD-10-CM

## 2018-07-31 DIAGNOSIS — F17210 Nicotine dependence, cigarettes, uncomplicated: Secondary | ICD-10-CM | POA: Diagnosis not present

## 2018-07-31 DIAGNOSIS — C3411 Malignant neoplasm of upper lobe, right bronchus or lung: Secondary | ICD-10-CM | POA: Diagnosis present

## 2018-07-31 DIAGNOSIS — Z853 Personal history of malignant neoplasm of breast: Secondary | ICD-10-CM | POA: Insufficient documentation

## 2018-07-31 DIAGNOSIS — J449 Chronic obstructive pulmonary disease, unspecified: Secondary | ICD-10-CM | POA: Insufficient documentation

## 2018-07-31 DIAGNOSIS — Z452 Encounter for adjustment and management of vascular access device: Secondary | ICD-10-CM | POA: Insufficient documentation

## 2018-07-31 DIAGNOSIS — Z9012 Acquired absence of left breast and nipple: Secondary | ICD-10-CM | POA: Diagnosis not present

## 2018-07-31 DIAGNOSIS — J309 Allergic rhinitis, unspecified: Secondary | ICD-10-CM | POA: Insufficient documentation

## 2018-07-31 MED ORDER — SODIUM CHLORIDE 0.9% FLUSH
10.0000 mL | INTRAVENOUS | Status: DC | PRN
  Administered 2018-07-31: 10 mL via INTRAVENOUS
  Filled 2018-07-31: qty 10

## 2018-07-31 MED ORDER — HEPARIN SOD (PORK) LOCK FLUSH 100 UNIT/ML IV SOLN
INTRAVENOUS | Status: AC
  Filled 2018-07-31: qty 5

## 2018-07-31 MED ORDER — HEPARIN SOD (PORK) LOCK FLUSH 100 UNIT/ML IV SOLN
500.0000 [IU] | Freq: Once | INTRAVENOUS | Status: AC
  Administered 2018-07-31: 500 [IU] via INTRAVENOUS

## 2018-08-12 ENCOUNTER — Encounter
Admission: RE | Admit: 2018-08-12 | Discharge: 2018-08-12 | Disposition: A | Discharge status: Home / Self Care | Payer: Medicare HMO | Source: Ambulatory Visit | Attending: Hematology and Oncology | Admitting: Hematology and Oncology

## 2018-08-12 ENCOUNTER — Other Ambulatory Visit: Payer: Self-pay

## 2018-08-12 DIAGNOSIS — Z79899 Other long term (current) drug therapy: Secondary | ICD-10-CM | POA: Diagnosis not present

## 2018-08-12 DIAGNOSIS — C3411 Malignant neoplasm of upper lobe, right bronchus or lung: Secondary | ICD-10-CM | POA: Diagnosis not present

## 2018-08-12 LAB — GLUCOSE, CAPILLARY: Glucose-Capillary: 109 mg/dL — ABNORMAL HIGH (ref 70–99)

## 2018-08-12 MED ORDER — FLUDEOXYGLUCOSE F - 18 (FDG) INJECTION
5.6000 | Freq: Once | INTRAVENOUS | Status: AC | PRN
  Administered 2018-08-12: 6.07 via INTRAVENOUS

## 2018-08-13 ENCOUNTER — Telehealth: Payer: Self-pay

## 2018-08-13 NOTE — Telephone Encounter (Signed)
Left a message on the patient voice mail to inform the patient that we need her to asnwer some traveling question before coming into for her visit tomorrow.

## 2018-08-14 ENCOUNTER — Inpatient Hospital Stay (HOSPITAL_BASED_OUTPATIENT_CLINIC_OR_DEPARTMENT_OTHER): Payer: Medicare HMO | Admitting: Urgent Care

## 2018-08-14 ENCOUNTER — Inpatient Hospital Stay: Payer: Medicare HMO

## 2018-08-14 ENCOUNTER — Other Ambulatory Visit: Payer: Self-pay

## 2018-08-14 VITALS — BP 116/66 | HR 86 | Temp 97.4°F | Resp 20 | Wt 112.4 lb

## 2018-08-14 DIAGNOSIS — E46 Unspecified protein-calorie malnutrition: Secondary | ICD-10-CM

## 2018-08-14 DIAGNOSIS — F17210 Nicotine dependence, cigarettes, uncomplicated: Secondary | ICD-10-CM

## 2018-08-14 DIAGNOSIS — Z9012 Acquired absence of left breast and nipple: Secondary | ICD-10-CM

## 2018-08-14 DIAGNOSIS — C3411 Malignant neoplasm of upper lobe, right bronchus or lung: Secondary | ICD-10-CM

## 2018-08-14 DIAGNOSIS — J309 Allergic rhinitis, unspecified: Secondary | ICD-10-CM

## 2018-08-14 DIAGNOSIS — E441 Mild protein-calorie malnutrition: Secondary | ICD-10-CM

## 2018-08-14 DIAGNOSIS — J3489 Other specified disorders of nose and nasal sinuses: Secondary | ICD-10-CM

## 2018-08-14 DIAGNOSIS — S32001S Stable burst fracture of unspecified lumbar vertebra, sequela: Secondary | ICD-10-CM

## 2018-08-14 DIAGNOSIS — R0982 Postnasal drip: Secondary | ICD-10-CM

## 2018-08-14 DIAGNOSIS — Z853 Personal history of malignant neoplasm of breast: Secondary | ICD-10-CM

## 2018-08-14 LAB — COMPREHENSIVE METABOLIC PANEL
ALT: 18 U/L (ref 0–44)
AST: 21 U/L (ref 15–41)
Albumin: 3.5 g/dL (ref 3.5–5.0)
Alkaline Phosphatase: 60 U/L (ref 38–126)
Anion gap: 7 (ref 5–15)
BUN: 22 mg/dL (ref 8–23)
CO2: 24 mmol/L (ref 22–32)
Calcium: 8.8 mg/dL — ABNORMAL LOW (ref 8.9–10.3)
Chloride: 104 mmol/L (ref 98–111)
Creatinine, Ser: 0.87 mg/dL (ref 0.44–1.00)
GFR calc Af Amer: >60 mL/min (ref >60)
GFR calc non Af Amer: >60 mL/min (ref >60)
Glucose, Bld: 103 mg/dL — ABNORMAL HIGH (ref 70–99)
Potassium: 3.6 mmol/L (ref 3.5–5.1)
Sodium: 135 mmol/L (ref 135–145)
Total Bilirubin: 0.2 mg/dL — ABNORMAL LOW (ref 0.3–1.2)
Total Protein: 6.8 g/dL (ref 6.5–8.1)

## 2018-08-14 LAB — CBC WITH DIFFERENTIAL/PLATELET
Abs Immature Granulocytes: 0.06 10*3/uL (ref 0.00–0.07)
Basophils Absolute: 0 10*3/uL (ref 0.0–0.1)
Basophils Relative: 1 %
Eosinophils Absolute: 0.1 10*3/uL (ref 0.0–0.5)
Eosinophils Relative: 1 %
HCT: 39.3 % (ref 36.0–46.0)
Hemoglobin: 12.6 g/dL (ref 12.0–15.0)
Immature Granulocytes: 1 %
Lymphocytes Relative: 25 %
Lymphs Abs: 1.9 10*3/uL (ref 0.7–4.0)
MCH: 30.1 pg (ref 26.0–34.0)
MCHC: 32.1 g/dL (ref 30.0–36.0)
MCV: 94 fL (ref 80.0–100.0)
Monocytes Absolute: 0.7 10*3/uL (ref 0.1–1.0)
Monocytes Relative: 9 %
Neutro Abs: 4.7 10*3/uL (ref 1.7–7.7)
Neutrophils Relative %: 63 %
Platelets: 291 10*3/uL (ref 150–400)
RBC: 4.18 MIL/uL (ref 3.87–5.11)
RDW: 16.7 % — ABNORMAL HIGH (ref 11.5–15.5)
WBC: 7.4 10*3/uL (ref 4.0–10.5)
nRBC: 0 % (ref 0.0–0.2)

## 2018-08-14 LAB — LACTATE DEHYDROGENASE: LDH: 148 U/L (ref 98–192)

## 2018-08-14 MED ORDER — LORATADINE 10 MG PO TABS: 10.0000 mg | ORAL_TABLET | Freq: Every day | ORAL | 1 refills | Status: DC

## 2018-08-14 NOTE — Progress Notes (Signed)
Pt here for follow up. Denies any concerns at this time.  

## 2018-08-14 NOTE — Progress Notes (Signed)
[facility] [address], [place], [place] [address] Phone: [phone]  Fax: [phone]    Clinic day:  08/14/2018  Chief Complaint: Roberta [name] is a 73 y.o. female with limited stage small cell lung cancer who is seen for a 3-week assessment and to review interval imaging.   HPI:  The patient was last seen in the medical oncology clinic on 07/25/2018.  At that time, patient was doing well.  She had a chronic cough.  Back pain had improved.  She denied any B symptoms or interval infections.  Eating well; weight up 2 pounds.  Patient can continue to prescribe Megace to stimulate her appetite, in addition to Ensure supplement shakes 3 times daily.  Exam was grossly unremarkable.  She was scheduled for interval imaging prior to next visit.  Interval PET imaging on 08/12/2018 revealed a 3.1 x 3.2 focus area of opacity in the RIGHT upper lobe (SUV 2.0).  Additionally, there was a 1.9 x 2.2 cm perihilar RIGHT lung lesion extending into the superior aspect of the RIGHT hilum (SUV 1.8).  Both areas considered to be infectious/inflammatory, however low-grade neoplasm cannot be excluded.  Short-term follow-up recommended.  In the interim, patient has been doing well overall.  She notes some minor seasonal allergy related symptoms.  Patient with rhinorrhea with associated postnasal drip.  She denies posterior pharyngitis.  She has not experienced any fevers.  Patient continues to experience a chronic cough related to her COPD diagnosis and underlying malignancy.  Patient continues to smoke on a daily basis. Patient denies that she has experienced any B symptoms. She denies any interval infections.   Patient advises that she maintains an adequate appetite. She is eating well. Weight today is 112 lb 7 oz (51 kg), which compared to her last visit to the clinic, represents a 4 pound increase.  Patient advising that the prescribed Megace is helping to stimulate her  appetite.  Patient also being followed by the cancer center dietitian, who has been providing patient with samples of nutritional shakes.  Patient denies pain in the clinic today.   Past Medical History:  Diagnosis Date   AAA (abdominal aortic aneurysm) ([place])    Anemia    Breast cancer ([place]) 2002   left   COPD (chronic obstructive pulmonary disease) ([facility])    Dyspnea    Headache    High cholesterol    Personal history of chemotherapy    Small cell lung cancer ([place])    Tobacco abuse     Past Surgical History:  Procedure Laterality Date   ABDOMINAL HYSTERECTOMY     ARTERY BIOPSY Right 12/05/2017   Procedure: BIOPSY TEMPORAL ARTERY;  Surgeon: Margaretha Sheffield, MD;  Location: [facility] ORS;  Service: ENT;  Laterality: Right;   ENDOBRONCHIAL ULTRASOUND N/A 02/19/2017   Procedure: ENDOBRONCHIAL ULTRASOUND;  Surgeon: Flora Lipps, MD;  Location: [facility] ORS;  Service: Cardiopulmonary;  Laterality: N/A;   ENDOVASCULAR REPAIR/STENT GRAFT N/A 12/06/2016   Procedure: Endovascular Repair/Stent Graft;  Surgeon: Katha Cabal, MD;  Location: [facility];  Service: Cardiovascular;  Laterality: N/A;   IR FLUORO GUIDE PORT INSERTION RIGHT  03/02/2017   KYPHOPLASTY N/A 06/11/2018   Procedure: KYPHOPLASTY L4 BIOPSY WITH RFA;  Surgeon: Hessie Knows, MD;  Location: [facility] ORS;  Service: Orthopedics;  Laterality: N/A;   MASTECTOMY Left 2002   with sentinel node     Family History  Problem Relation Age of Onset   CAD Mother  Colon cancer Brother    Breast cancer Neg Hx     Social History:  reports that she has been smoking cigarettes. She has a 25.00 pack-year smoking history. She has never used smokeless tobacco. She reports that she does not drink alcohol or use drugs.  She is a 0.5 ppd smoker; formally 1 ppd smoker since age of 63. 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 The patient's sister's name is [name].  Allergies: No Known Allergies  Current  Medications: Current Outpatient Medications  Medication Sig Dispense Refill   acetaminophen (TYLENOL) 325 MG tablet Take 650 mg by mouth every 6 (six) hours as needed for mild pain.      albuterol (2.5 MG/3ML) 0.083% NEBU 3 mL, albuterol (5 MG/ML) 0.5% NEBU 0.5 mL Inhale 1 mg into the lungs.     albuterol (PROVENTIL HFA;VENTOLIN HFA) 108 (90 Base) MCG/ACT inhaler Inhale 2 puffs into the lungs every 6 (six) hours as needed for wheezing or shortness of breath. 1 Inhaler 1   aspirin EC 81 MG tablet Take 81 mg by mouth daily.     atorvastatin (LIPITOR) 10 MG tablet Take 10 mg by mouth at bedtime.      fluticasone-salmeterol (ADVAIR HFA) 115-21 MCG/ACT inhaler Inhale 2 puffs into the lungs 2 (two) times daily.     ipratropium (ATROVENT HFA) 17 MCG/ACT inhaler Inhale 2 puffs into the lungs every 6 (six) hours as needed for wheezing. 1 Inhaler 12   megestrol (MEGACE) 40 MG/ML suspension Take 5 mLs (200 mg total) by mouth daily. 480 mL 0   Multiple Vitamin (MULTIVITAMIN) tablet Take 1 tablet by mouth daily.     loratadine (CLARITIN) 10 MG tablet Take 1 tablet (10 mg total) by mouth daily. 30 tablet 1   No current facility-administered medications for this visit.    Facility-Administered Medications Ordered in Other Visits  Medication Dose Route Frequency Provider Last Rate Last Dose   sodium chloride flush (NS) 0.9 % injection 10 mL  10 mL Intravenous PRN Sindy Guadeloupe, MD   10 mL at 03/26/17 0948   sodium chloride flush (NS) 0.9 % injection 10 mL  10 mL Intravenous PRN Sindy Guadeloupe, MD   10 mL at 04/16/17 1006   sodium chloride flush (NS) 0.9 % injection 10 mL  10 mL Intravenous PRN Sindy Guadeloupe, MD   10 mL at 01/10/18 1410     Review of Systems  Constitutional: Negative for diaphoresis, fever, malaise/fatigue and weight loss (up 4 pounds).       "I have been doing good".  HENT: Negative.  Negative for congestion, nosebleeds, sinus pain and sore throat.        (+) rhinorrhea  and postnasal drip  Eyes: Negative.   Respiratory: Positive for cough and shortness of breath (exertional; stable). Negative for hemoptysis and sputum production.        COPD.  Known lung malignancy.  Continues to smoke on a daily basis.  Cardiovascular: Negative for chest pain, palpitations, orthopnea, leg swelling and PND.  Gastrointestinal: Negative for abdominal pain, blood in stool, constipation, diarrhea, melena, nausea and vomiting.  Genitourinary: Negative for dysuria, frequency, hematuria and urgency.  Musculoskeletal: Negative for back pain, falls, joint pain and myalgias.  Skin: Negative for itching and rash.  Neurological: Negative for dizziness, tremors, weakness and headaches.  Endo/Heme/Allergies: Positive for environmental allergies (sesonal). Does not bruise/bleed easily.  Psychiatric/Behavioral: Negative for depression, memory loss and suicidal ideas. The patient is not  nervous/anxious and does not have insomnia.   All other systems reviewed and are negative.  Performance status (ECOG): 1 - 2  Vital Signs BP 116/66 (BP Location: Right Arm, Patient Position: Sitting)    Pulse 86    Temp (!) 97.4 F (36.3 C) (Tympanic)    Resp 20    Wt 112 lb 7 oz (51 kg)    SpO2 100%    BMI 17.61 kg/m   Physical Exam  Constitutional: She is oriented to person, place, and time. She appears malnourished. She appears cachectic.  Chronically fatigued appearing  HENT:  Head: Normocephalic and atraumatic.  Nose: Rhinorrhea present.  Mouth/Throat: Oropharynx is clear and moist and mucous membranes are normal.  (+) post nasal drip; clear.   Eyes: Pupils are equal, round, and reactive to light. EOM are normal. No scleral icterus.  Neck: Normal range of motion. Neck supple. No tracheal deviation present. No thyromegaly present.  Cardiovascular: Normal rate, regular rhythm, normal heart sounds and intact distal pulses. Exam reveals no gallop and no friction rub.  No murmur  heard. Pulmonary/Chest: Effort normal. No respiratory distress. She has no wheezes. She has rales (dry).  Abdominal: Soft. Bowel sounds are normal. She exhibits no distension. There is no abdominal tenderness.  Musculoskeletal: Normal range of motion.        General: No tenderness or edema.  Lymphadenopathy:    She has no cervical adenopathy.    She has no axillary adenopathy.       Right: No inguinal and no supraclavicular adenopathy present.       Left: No inguinal and no supraclavicular adenopathy present.  Neurological: She is alert and oriented to person, place, and time.  Skin: Skin is warm and dry. No rash noted. No erythema.  Psychiatric: Mood, affect and judgment normal.  Nursing note and vitals reviewed.   Infusion on 08/14/2018  Component Date Value Ref Range Status   LDH 08/14/2018 148  98 - 192 U/L Final   Performed at [facility], [address] [place], [place] [address]   Sodium 08/14/2018 135  135 - 145 mmol/L Final   Potassium 08/14/2018 3.6  3.5 - 5.1 mmol/L Final   Chloride 08/14/2018 104  98 - 111 mmol/L Final   CO2 08/14/2018 24  22 - 32 mmol/L Final   Glucose, Bld 08/14/2018 103* 70 - 99 mg/dL Final   BUN 08/14/2018 22  8 - 23 mg/dL Final   Creatinine, Ser 08/14/2018 0.87  0.44 - 1.00 mg/dL Final   Calcium 08/14/2018 8.8* 8.9 - 10.3 mg/dL Final   Total Protein 08/14/2018 6.8  6.5 - 8.1 g/dL Final   Albumin 08/14/2018 3.5  3.5 - 5.0 g/dL Final   AST 08/14/2018 21  15 - 41 U/L Final   ALT 08/14/2018 18  0 - 44 U/L Final   Alkaline Phosphatase 08/14/2018 60  38 - 126 U/L Final   Total Bilirubin 08/14/2018 0.2* 0.3 - 1.2 mg/dL Final   GFR calc non Af Amer 08/14/2018 >60  >60 mL/min Final   GFR calc Af Amer 08/14/2018 >60  >60 mL/min Final   Anion gap 08/14/2018 7  5 - 15 Final   Performed at [facility], [address], [place], [place] [address]   WBC 08/14/2018 7.4  4.0 - 10.5 K/uL Final   RBC 08/14/2018 4.18   3.87 - 5.11 MIL/uL Final   Hemoglobin 08/14/2018 12.6  12.0 - 15.0 g/dL Final   HCT 08/14/2018  39.3  36.0 - 46.0 % Final   MCV 08/14/2018 94.0  80.0 - 100.0 fL Final   MCH 08/14/2018 30.1  26.0 - 34.0 pg Final   MCHC 08/14/2018 32.1  30.0 - 36.0 g/dL Final   RDW 08/14/2018 16.7* 11.5 - 15.5 % Final   Platelets 08/14/2018 291  150 - 400 K/uL Final   nRBC 08/14/2018 0.0  0.0 - 0.2 % Final   Neutrophils Relative % 08/14/2018 63  % Final   Neutro Abs 08/14/2018 4.7  1.7 - 7.7 K/uL Final   Lymphocytes Relative 08/14/2018 25  % Final   Lymphs Abs 08/14/2018 1.9  0.7 - 4.0 K/uL Final   Monocytes Relative 08/14/2018 9  % Final   Monocytes Absolute 08/14/2018 0.7  0.1 - 1.0 K/uL Final   Eosinophils Relative 08/14/2018 1  % Final   Eosinophils Absolute 08/14/2018 0.1  0.0 - 0.5 K/uL Final   Basophils Relative 08/14/2018 1  % Final   Basophils Absolute 08/14/2018 0.0  0.0 - 0.1 K/uL Final   Immature Granulocytes 08/14/2018 1  % Final   Abs Immature Granulocytes 08/14/2018 0.06  0.00 - 0.07 K/uL Final   Performed at [facility], [address], [place], [place] [address]  Hospital Outpatient Visit on 08/12/2018  Component Date Value Ref Range Status   Glucose-Capillary 08/12/2018 109* 70 - 99 mg/dL Final    Assessment:  CHANDNI GAGAN is a 73 y.o. female with limited stage small cell lung cancer. She had stage IIIB (T3N2M0) disease.  She received cisplatin and etoposide from 03/05/2017 - 05/14/2017.   She received radiation from  03/08/2017 - 04/23/2017.  Chest, abdomen, and pelvic CT on 05/30/2017 revealed continued improvement in the right upper lobe mass, right hilar and mediastinal lymphadenopathy consistent with response to therapy.  there was interval development of multiple small ill-defined peribronchovascular nodules in both lungs, greatest in the right upper lobe. Some of these were clustered, and were not associated with the bronchovascular bundles  such that lymphangitic tumor was unlikely. These were likely inflammatory.   There was no evidence of metastatic disease in the abdomen or pelvis  She received PCI from 06/19/2017 - 07/11/2017.  Chest, abdomen, and pelvic CT on 06/07/2018 revealed interval development of L4 vertebral body burst fracture with mild retropulsion of the posterior aspect of the vertebral body.  There was associated sclerosis of the L4 vertebral body with significant height loss predominately involving the anterior and mid aspect of the vertebral body. Possibility of pathologic fracture was not excluded. There was a nondisplaced right L4 transverse process fracture. Recommend further evaluation with lumbar spine MRI.  There were findings suggestive of mild acute sigmoid colonic diverticulitis.  There was similar to mild interval increase in size of previously described lesion within the right upper lobe (3.1 x 1.8 cm to 3.2 x 2.2 cm).  Adjacent irregular nodule along the inferior margin within the right upper lobe was grossly similar when compared to prior (1.9 x 1.1 cm to 1.2 x 2.0 cm).  Additional areas of consolidation within the right lung were grossly similar.  There was interval improvement in previously described tree-in-bud nodularity within the left lower lobe with slight interval increase in ground-glass nodularity within the right lower lobe suggestive of atypical infectious process (MAI).  PET scan on 08/12/2018 revealed a 3.1 x 3.2 focus area of opacity in the RIGHT upper lobe (SUV 2.0).  Additionally, there was a 1.9 x 2.2 cm perihilar RIGHT lung lesion  extending into the superior aspect of the RIGHT hilum (SUV 1.8).  Both areas considered to be infectious/inflammatory, however low-grade neoplasm cannot be excluded.  Short-term follow-up recommended.  She underwent kyphoplasty of L4 with radiofrequency ablation on 06/11/2018.  Pathology revealed viable bone and trilineage metabolic marrow with reactive changes and  focal changes of fracture callus.  IHC stains for pancytokeratin were negative.  There was no evidence of metastatic disease.  She has a history of left breast cancer in 2002.  She underwent left mastectomy followed by adjuvant chemotherapy. She was treated by Dr. Oliva Bustard. She did not receive adjuvant radiation.  Symptomatically, patient with seasonal allergy symptoms (rhinorrhea and postnasal drip).  Patient with chronic cough related to her COPD diagnosis and underlying malignancy.  She denies any B symptoms or recent infections.  Exam reveals scattered dry rales bilaterally.  CBC 7400 ([place] 4700).  Calcium 8.8 mg/dL.  LDH normal at 148 U/L.  CEA pending.   Plan:   1. Labs today CBC with differential, CMP, CEA, LDH 2. Limited stage SCLC  Doing well clinically.  No increased shortness of breath.  Review PET scan. Imaging personally reviewed and felt to be consistent with the dictated radiology report. Imaging reviewed with patient.   3.1 x 3.2 focus area of opacity in the RIGHT upper lobe (SUV 2.0).    1.9 x 2.2 cm perihilar RIGHT lung lesion extending into the superior aspect of the RIGHT hilum (SUV 1.8).    Both areas considered to be infectious/inflammatory, however low-grade neoplasm cannot be excluded.    Short-term follow-up recommended.  Discuss plans for short-term follow-up CT imaging of the chest to reassess areas of concern in the right lung.  Patient in agreement.  Will schedule chest CT with contrast in 3 months. 3. Seasonal allergies  Acute exacerbation of symptoms.  Rhinorrhea and postnasal drip.  Asking for intervention for symptomatic relief.  Rx: loratadine 10 mg daily (Disp #30 r1) 4. L4 burst fracture status post kyphoplasty  Denies complaints of pain.  Reviewed that biopsy at the time of kyphoplasty revealed no evidence of disease.  No current plans for radiation therapy at this time.    Patient scheduled to follow-up with radiation oncology in 6  months. 5. Protein calorie malnutrition  Weight up 4 pounds since last visit.  Her weight today is 112 lb 7 oz (51 kg). Her BMI of 17.61 kg/m places her in the underweight category.   Encouraged her to increase her intake of calorie and protein dense food choices.   Encouraged to utilize nutritional supplement shakes at least 2-3 times a day.   Continue Megace as previously prescribed.  Continue to follow-up with cancer center RD for ongoing monitoring and medical nutrition management. 6. RTC in 3 months for MD assessment, labs (CBC with diff, CMP, CEA, LDH), and review of CT scan.   Honor Loh, NP  08/14/2018, 3:30 PM

## 2018-08-15 LAB — CEA: CEA: 2.5 ng/mL (ref 0.0–4.7)

## 2018-08-21 ENCOUNTER — Telehealth: Payer: Self-pay

## 2018-08-21 ENCOUNTER — Other Ambulatory Visit: Payer: Self-pay

## 2018-08-21 NOTE — Telephone Encounter (Signed)
Nutrition  Called patient this am and left message on voicemail asking patient not to come to nutrition appointment on 3/26 due to reducing risk of spread of COVID-19.  RD would like to complete visit via phone.  Asked patient to call RD back to discuss nutrition over the phone.    Arliss Hepburn B. Zenia Resides, [place], [place] Registered Dietitian [phone] (pager)

## 2018-08-22 ENCOUNTER — Ambulatory Visit: Payer: Medicare HMO

## 2018-08-22 ENCOUNTER — Inpatient Hospital Stay: Payer: Medicare HMO

## 2018-08-22 NOTE — Progress Notes (Signed)
Nutrition Follow-up:  Patient with lung cancer followed by Dr Mike Gip.    Called patient again today for nutrition follow-up.  Patient reports that appetite is "a whole lot better."  Thinks the medicine (megace) is helping.  Reports that she ate sausage and gravy biscuit this am, tomato sandwich for lunch with chicken noodle soup.  Last night for dinner ate 1 piece of fried flounder, fries and few hushpuppies and slaw that sister brought her.  Reports that she has been drinking ensure about 1 time per day and often has snacks during the day (just ate apple slices).  Reports son just made tuna salad and egg salad last night for her to eat.    Denies any nutrition impact symptoms at this time.     Medications: reviewed  Labs: reviewed  Anthropometrics:   Weight has increased to 112 lb 7 oz noted on 3/18 from 108 lb 14.5 oz on 2/27.   NUTRITION DIAGNOSIS: Unintentional weight loss improved   INTERVENTION:  Reviewed strategies to continue to eat high calorie, high protein foods to continue weight gain. Patient verbalized understanding.  Encouraged 3 meals per day and at least 1 snack.   Oral nutrition supplements were encouraged as well     MONITORING, EVALUATION, GOAL: Patient will consume adequate calories and protein to prevent further weight loss   NEXT VISIT: no follow-up planned at this time.  Patient instructed to call RD if weight starts declining.    Kyana Aicher B. Zenia Resides, [place], [place] Registered Dietitian [phone] (pager)

## 2018-09-02 IMAGING — DX DG CHEST 1V PORT
1 series · 1 of 1 positions shown · non-contrast
Comparison: 04/06/2017

CLINICAL DATA: Known right upper lobe small cell lung cancer, fever

EXAM:
PORTABLE CHEST 1 VIEW

[chest ap]
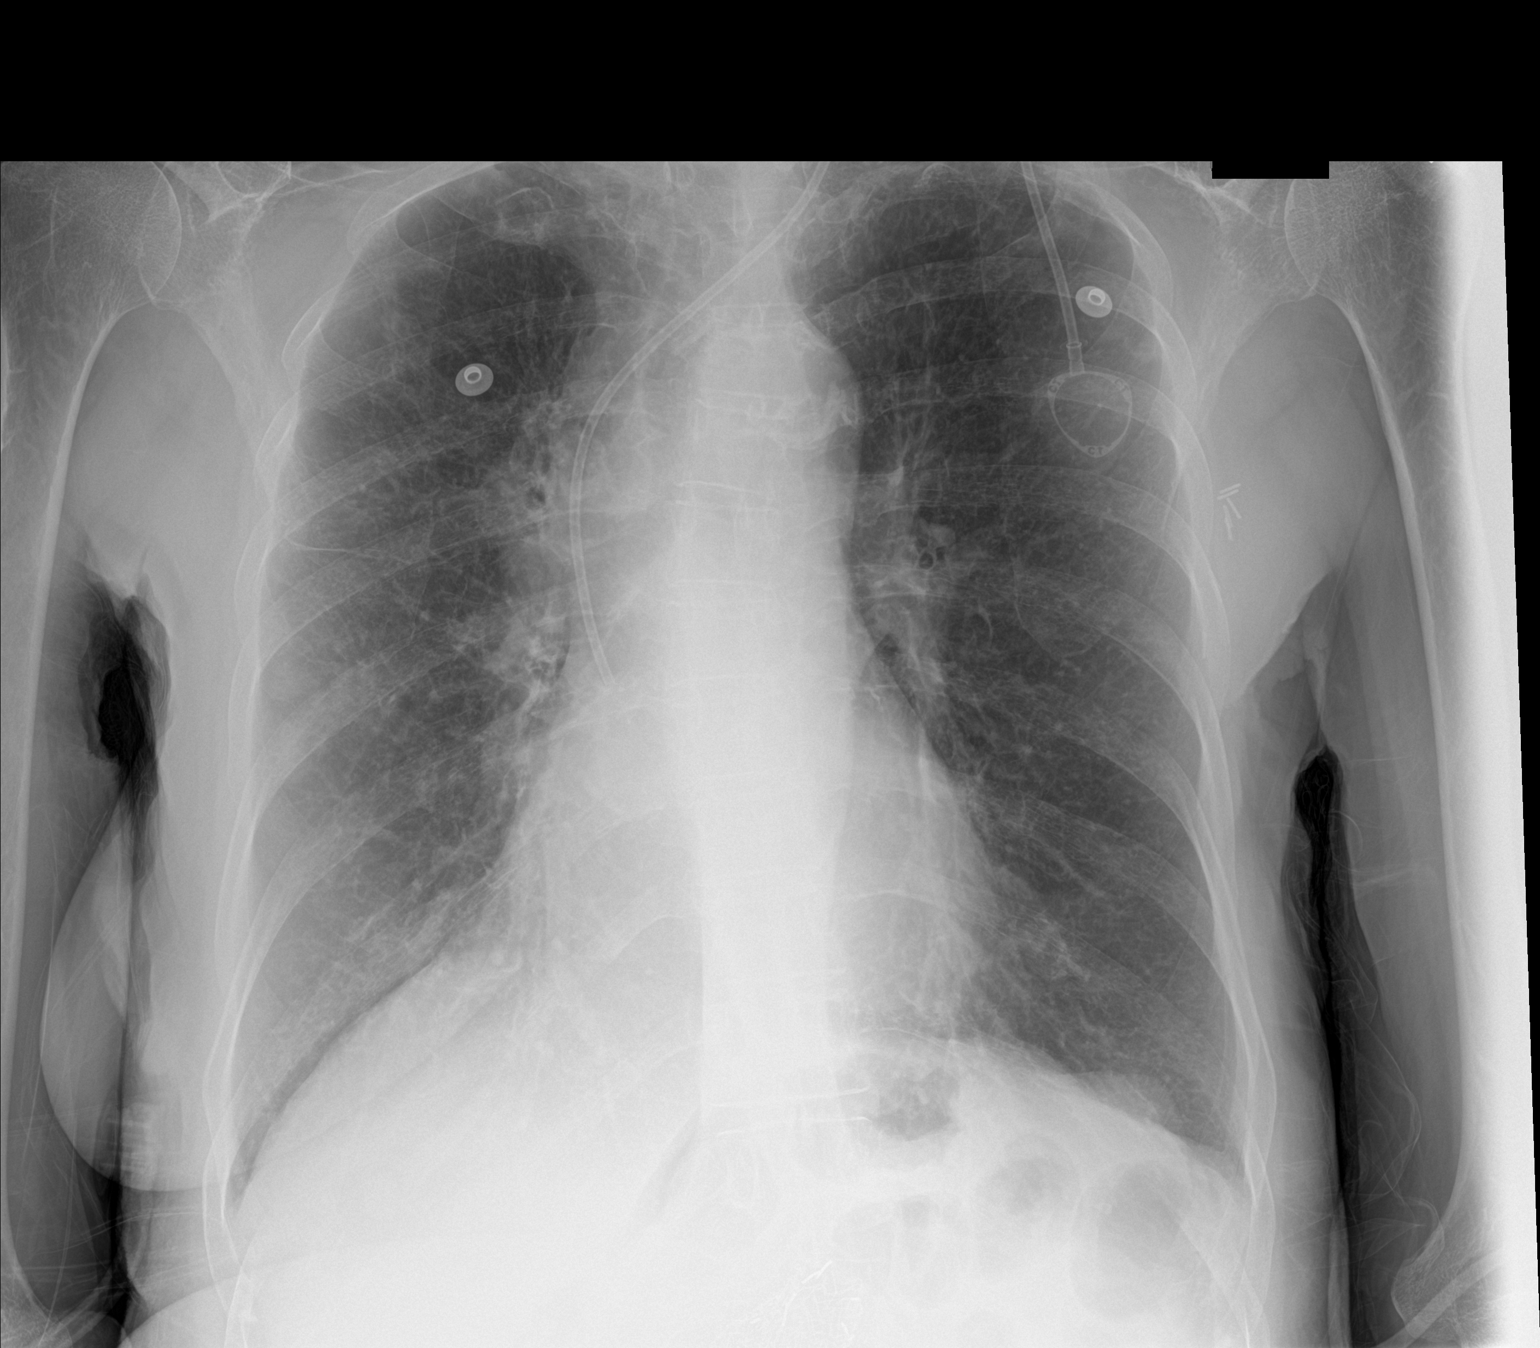

[1 of 1 positions shown; findings below may reference images not displayed]

FINDINGS: Left IJ power port catheter tip SVC RA junction. Ill-defined right
hilar mass noted but better demonstrated by CT comparison.

Biapical pleural-parenchymal scarring and nodularity appears grossly
stable. Mild background stable interstitial prominence. No
superimposed pneumonia, significant collapse or consolidation.
Negative for CHF or edema. No large effusion or pneumothorax.
Trachea is midline. Aorta is atherosclerotic. Bones are osteopenic.
IMPRESSION: Stable right upper lobe hilar mass and chronic changes as above.

No definite superimposed acute process or edema.

No large effusion or pneumothorax.

## 2018-09-11 ENCOUNTER — Inpatient Hospital Stay: Payer: Medicare HMO

## 2018-09-25 ENCOUNTER — Inpatient Hospital Stay: Payer: Medicare HMO

## 2018-10-04 IMAGING — CT CT CHEST W/ CM
2 of 5 series · 12 of 36 positions shown, 15 images · IV contrast (iopamidol)
Comparison: CTs 04/06/2017.

CLINICAL DATA: Small cell lung cancer post completion of the
initial therapy (chemotherapy and radiation therapy). History of
left-sided breast cancer.

EXAM:
CT CHEST, ABDOMEN, AND PELVIS WITH CONTRAST
TECHNIQUE: Multidetector CT imaging of the chest, abdomen and pelvis was
performed following the standard protocol during bolus
administration of intravenous contrast.
CONTRAST:  80mL 9VSI9B-WHH IOPAMIDOL (9VSI9B-WHH) INJECTION 61%

[Series 2: cap with · axial · 0.71mm/px · z∈[-796,-276]mm · 9 of 132 slices shown, 12 images]
[im 14/132  mediastinal]
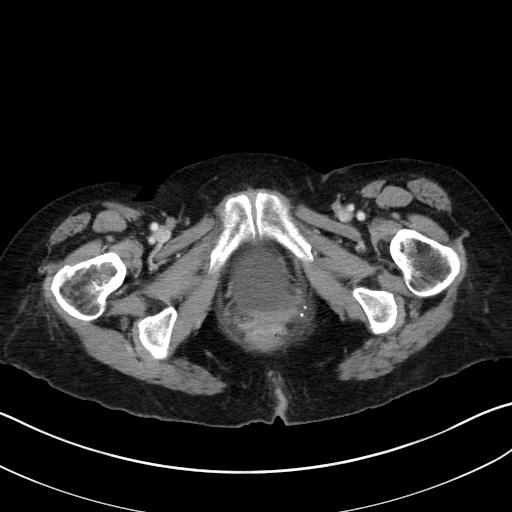
[im 14/132  lung]
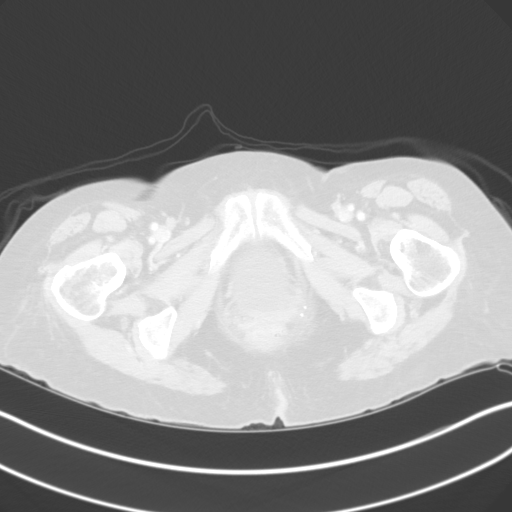
[im 27/132  lung]
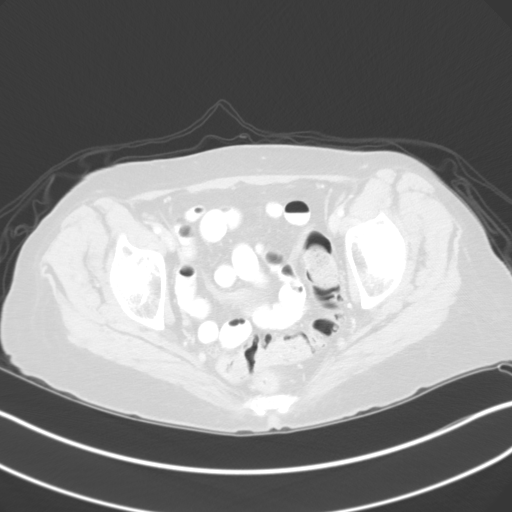
[im 40/132  lung]
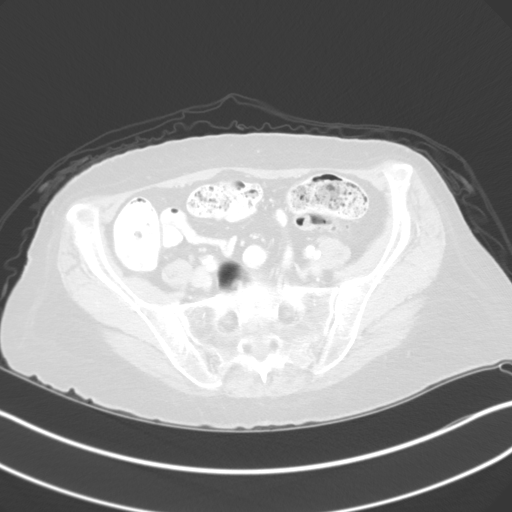
[im 53/132  lung]
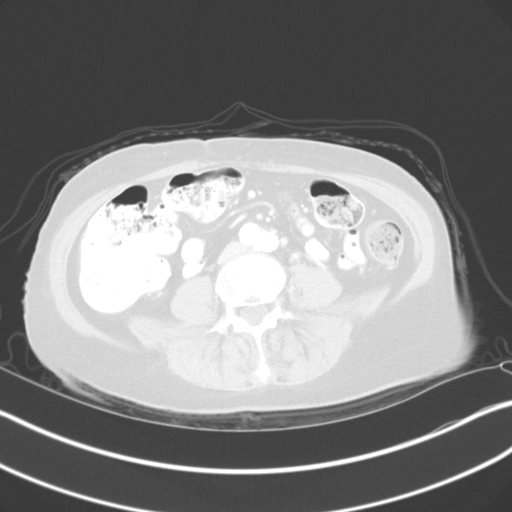
[im 66/132  mediastinal]
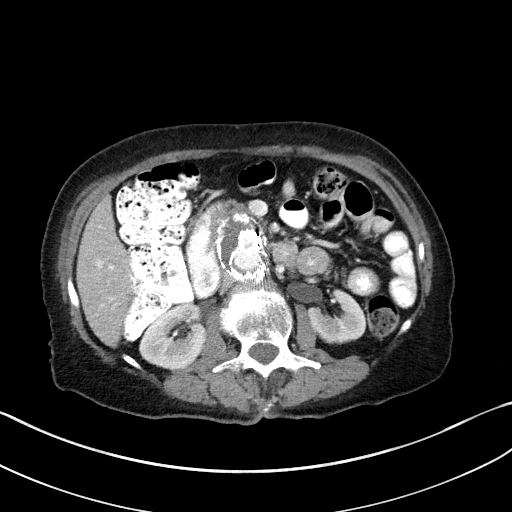
[im 66/132  lung]
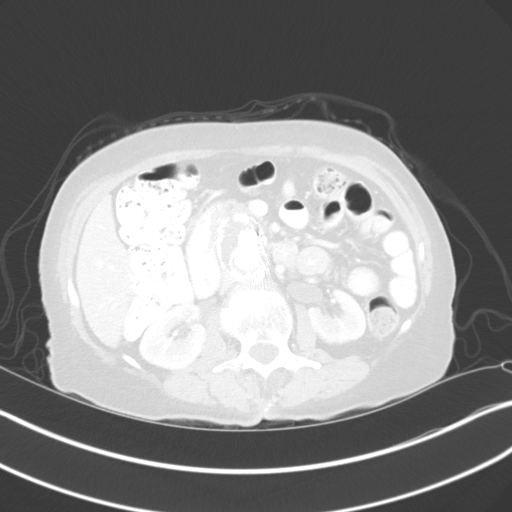
[im 79/132  lung]
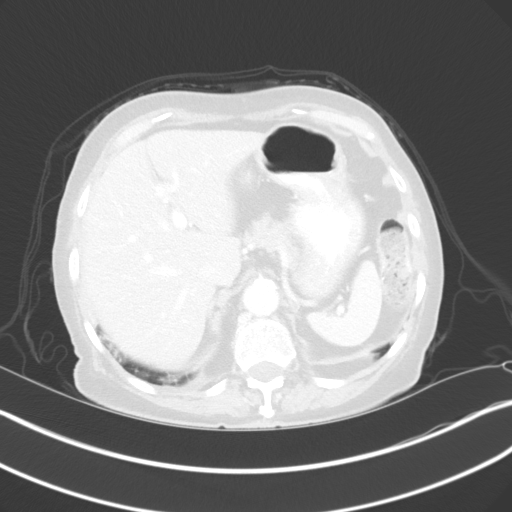
[im 92/132  lung]
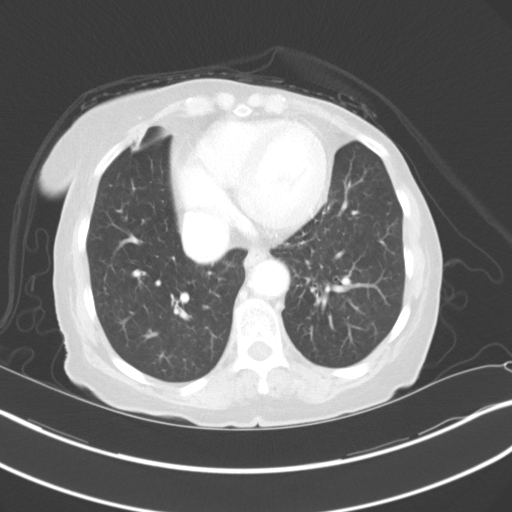
[im 105/132  lung]
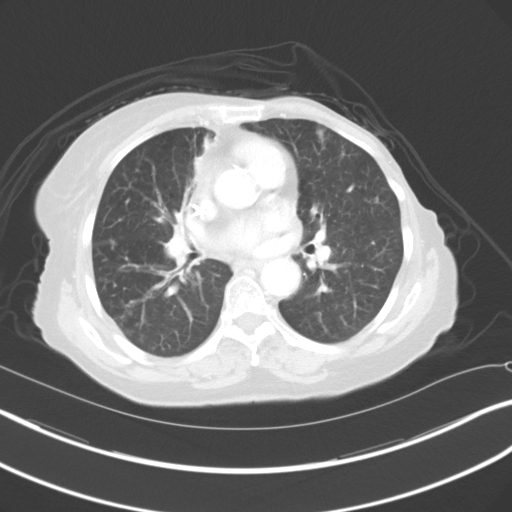
[im 118/132  mediastinal]
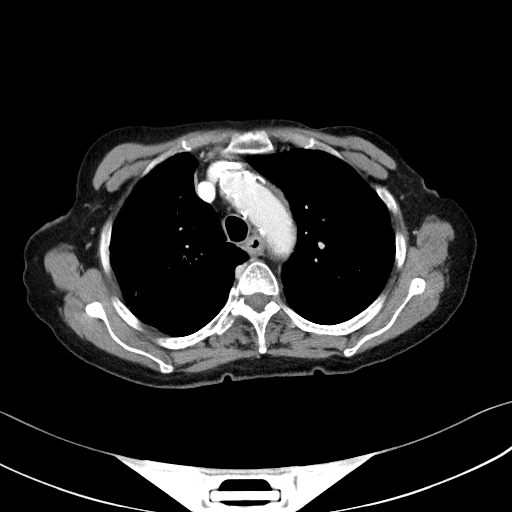
[im 118/132  lung]
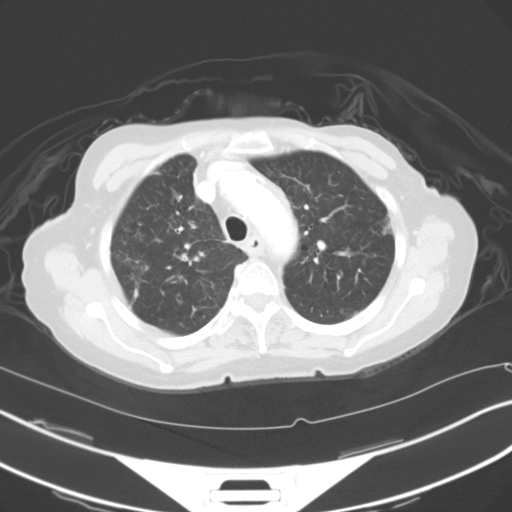

[Series 5: coronals · coronal · 0.82mm/px · 3 of 137 slices shown]
[im 28/137  lung]
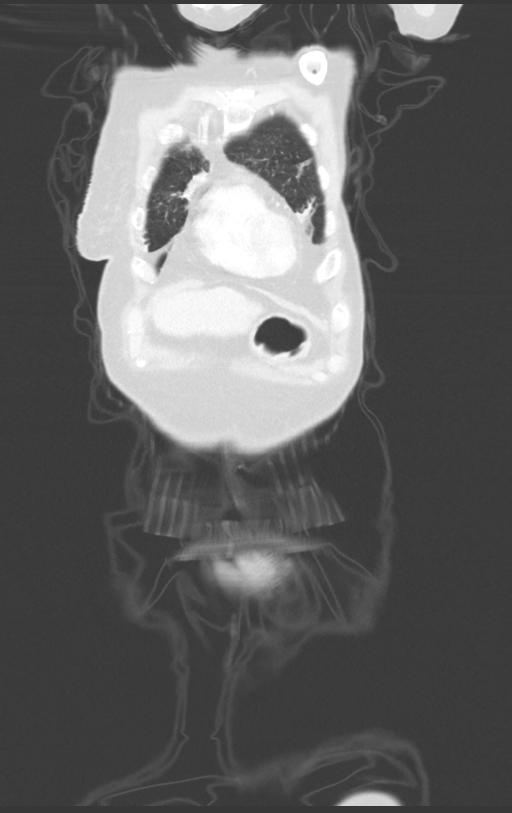
[im 55/137  lung]
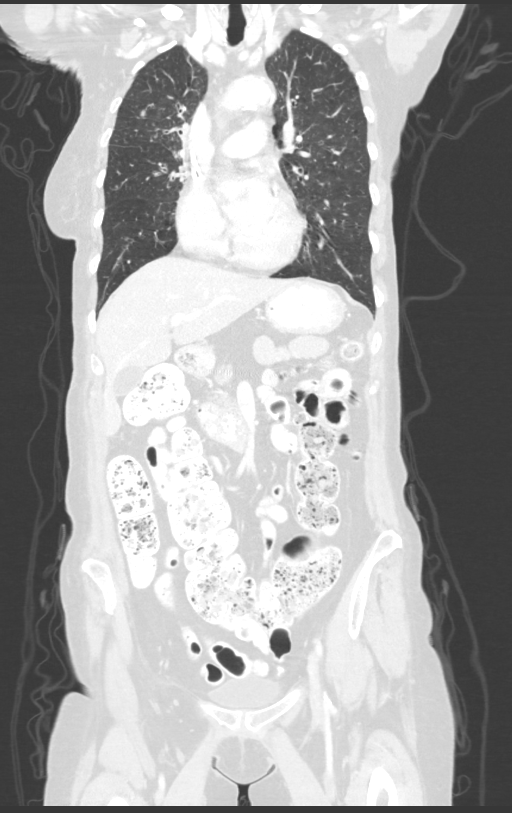
[im 82/137  lung]
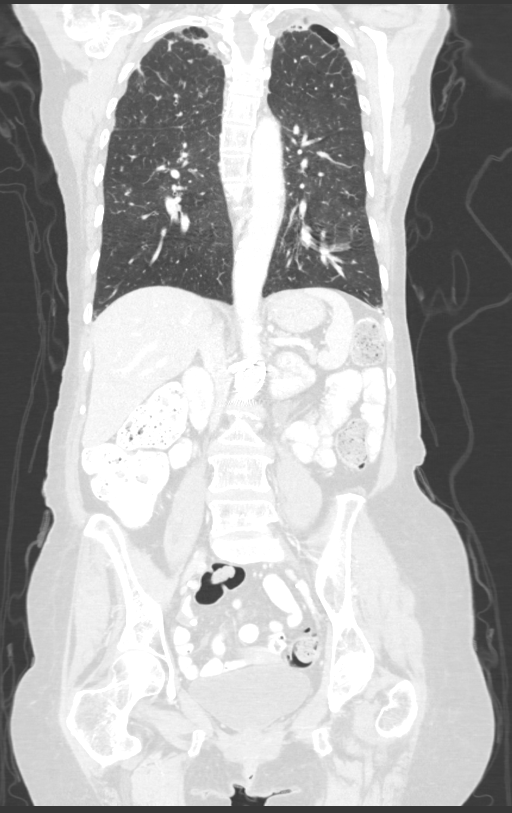

[12 of 36 positions shown; findings below may reference images not displayed]

FINDINGS: CT CHEST FINDINGS

Cardiovascular: There is atherosclerosis of the aorta, great vessels
and coronary arteries. No acute vascular findings are demonstrated.
Left IJ central venous catheter extends to the superior cavoatrial
junction. The heart size is normal. There is no pericardial
effusion.

Mediastinum/Nodes: Further improvement in mediastinal and right
hilar adenopathy. Right paratracheal node measures 8 mm short axis
on image 21 (previously 14 mm). Right hilar nodes measure 12 x 10 mm
(previously 16 mm) and 15 x 18 mm on image 24 (previously 20 x 24
mm). No new or enlarging lymph nodes are seen. The thyroid gland,
trachea and esophagus demonstrate no significant findings.

Lungs/Pleura: Trace pleural fluid on the right. Peripheral right
upper lobe lesion has decreased in size, now measuring 15 x 11 mm on
image 32 (previously 16 x 12 mm. There is underlying emphysema with
diffuse central airway thickening, asymmetric to the right. There is
some mucous plugging in the right upper lobe bronchi. Interval
development of multiple ill-defined peribronchial vascular nodules
bilaterally. These are greatest within the peripheral aspects of the
right upper and middle lobes. Largest component measures 7 x 10 mm
in the right upper lobe (image 51).

Musculoskeletal/Chest wall: No chest wall mass or suspicious osseous
findings. Previous left mastectomy.

CT ABDOMEN AND PELVIS FINDINGS

Hepatobiliary: The liver is normal in density without focal
abnormality. No evidence of gallstones, gallbladder wall thickening
or biliary dilatation.

Pancreas: Unremarkable. No pancreatic ductal dilatation or
surrounding inflammatory changes.

Spleen: Normal in size without focal abnormality.

Adrenals/Urinary Tract: Both adrenal glands appear normal. The
kidneys appear normal without evidence of urinary tract calculus,
suspicious lesion or hydronephrosis. No bladder abnormalities are
seen.

Stomach/Bowel: No evidence of bowel wall thickening, distention or
surrounding inflammatory change. Sigmoid colon diverticular changes.
The appendix appears normal.

Vascular/Lymphatic: There are no enlarged abdominal or pelvic lymph
nodes. Stable appearance of thrombosed, excluded abdominal aortic
aneurysm status post aortoiliac stent grafting. The iliac grafts are
patent. There is a right renal stent which is patent. No acute
vascular or significant venous abnormalities.

Reproductive: The uterus and ovaries appear normal. No evidence of
adnexal mass.

Other: No evidence of abdominal wall mass or hernia. No ascites.

Musculoskeletal: No acute or significant osseous findings. Mild
degenerative changes in the spine.
IMPRESSION: 1. Continued improvement in the right upper lobe mass, right hilar
and mediastinal lymphadenopathy consistent with response to therapy.
2. Interval development of multiple small ill-defined
peribronchovascular nodules in both lungs, greatest in the right
upper lobe. Some of these are clustered, and are not associated with
the bronchovascular bundles such that lymphangitic tumor is
unlikely. These are likely inflammatory. Attention on follow-up
recommended.
3. No evidence of metastatic disease in the abdomen or pelvis.
4. Stable appearance of abdominal aortic aneurysm status post stent
grafting.

## 2018-10-23 ENCOUNTER — Inpatient Hospital Stay: Payer: Medicare HMO

## 2018-11-01 ENCOUNTER — Other Ambulatory Visit: Payer: Self-pay | Admitting: *Deleted

## 2018-11-11 ENCOUNTER — Other Ambulatory Visit: Payer: Self-pay

## 2018-11-12 ENCOUNTER — Ambulatory Visit
Admission: RE | Admit: 2018-11-12 | Discharge: 2018-11-12 | Disposition: A | Discharge status: Home / Self Care | Payer: Medicare HMO | Source: Ambulatory Visit | Attending: Urgent Care | Admitting: Urgent Care

## 2018-11-12 ENCOUNTER — Inpatient Hospital Stay: Payer: Medicare HMO | Attending: Hematology and Oncology

## 2018-11-12 ENCOUNTER — Other Ambulatory Visit: Payer: Self-pay

## 2018-11-12 DIAGNOSIS — C3411 Malignant neoplasm of upper lobe, right bronchus or lung: Secondary | ICD-10-CM | POA: Diagnosis present

## 2018-11-12 LAB — CBC WITH DIFFERENTIAL/PLATELET
Abs Immature Granulocytes: 0.04 10*3/uL (ref 0.00–0.07)
Basophils Absolute: 0 10*3/uL (ref 0.0–0.1)
Basophils Relative: 0 %
Eosinophils Absolute: 0.1 10*3/uL (ref 0.0–0.5)
Eosinophils Relative: 1 %
HCT: 42.2 % (ref 36.0–46.0)
Hemoglobin: 13.7 g/dL (ref 12.0–15.0)
Immature Granulocytes: 1 %
Lymphocytes Relative: 28 %
Lymphs Abs: 1.8 10*3/uL (ref 0.7–4.0)
MCH: 30 pg (ref 26.0–34.0)
MCHC: 32.5 g/dL (ref 30.0–36.0)
MCV: 92.3 fL (ref 80.0–100.0)
Monocytes Absolute: 0.6 10*3/uL (ref 0.1–1.0)
Monocytes Relative: 9 %
Neutro Abs: 4 10*3/uL (ref 1.7–7.7)
Neutrophils Relative %: 61 %
Platelets: 208 10*3/uL (ref 150–400)
RBC: 4.57 MIL/uL (ref 3.87–5.11)
RDW: 13.2 % (ref 11.5–15.5)
WBC: 6.6 10*3/uL (ref 4.0–10.5)
nRBC: 0 % (ref 0.0–0.2)

## 2018-11-12 LAB — COMPREHENSIVE METABOLIC PANEL
ALT: 13 U/L (ref 0–44)
AST: 19 U/L (ref 15–41)
Albumin: 3.6 g/dL (ref 3.5–5.0)
Alkaline Phosphatase: 83 U/L (ref 38–126)
Anion gap: 8 (ref 5–15)
BUN: 16 mg/dL (ref 8–23)
CO2: 26 mmol/L (ref 22–32)
Calcium: 8.7 mg/dL — ABNORMAL LOW (ref 8.9–10.3)
Chloride: 98 mmol/L (ref 98–111)
Creatinine, Ser: 0.93 mg/dL (ref 0.44–1.00)
GFR calc Af Amer: >60 mL/min (ref >60)
GFR calc non Af Amer: >60 mL/min (ref >60)
Glucose, Bld: 99 mg/dL (ref 70–99)
Potassium: 3.9 mmol/L (ref 3.5–5.1)
Sodium: 132 mmol/L — ABNORMAL LOW (ref 135–145)
Total Bilirubin: 0.6 mg/dL (ref 0.3–1.2)
Total Protein: 6.8 g/dL (ref 6.5–8.1)

## 2018-11-12 LAB — LACTATE DEHYDROGENASE: LDH: 146 U/L (ref 98–192)

## 2018-11-12 MED ORDER — IOHEXOL 300 MG/ML  SOLN
75.0000 mL | Freq: Once | INTRAMUSCULAR | Status: AC | PRN
  Administered 2018-11-12: 75 mL via INTRAVENOUS

## 2018-11-12 MED ORDER — HEPARIN SOD (PORK) LOCK FLUSH 100 UNIT/ML IV SOLN
500.0000 [IU] | Freq: Once | INTRAVENOUS | Status: AC
  Administered 2018-11-12: 500 [IU] via INTRAVENOUS

## 2018-11-12 MED ORDER — SODIUM CHLORIDE 0.9% FLUSH
10.0000 mL | INTRAVENOUS | Status: DC | PRN
  Administered 2018-11-12: 10 mL via INTRAVENOUS
  Filled 2018-11-12: qty 10

## 2018-11-12 MED ORDER — LORATADINE 10 MG PO TABS: 10.0000 mg | ORAL_TABLET | Freq: Every day | ORAL | 1 refills | Status: DC

## 2018-11-12 NOTE — Progress Notes (Signed)
Patient verified her cell # for telephone visit on Thursday; states she has not done any virtual or telephone visits. Patient is hard of hearing as well. Patient reports her back still hurts despite that procedure she had for spine fracture. Taking aleve with out much pain relief.

## 2018-11-13 LAB — CEA: CEA: 2.2 ng/mL (ref 0.0–4.7)

## 2018-11-13 NOTE — Progress Notes (Signed)
[facility]  [address], [address] [place], [place] [address] Phone: [phone]  Fax: [phone]   Telephone Visit:  11/14/2018  Referring physician: Gayland Curry, MD  I connected with Roberta [name] on 11/14/2018 at 11:55 AM EDT by telephone and verified that I was speaking with the correct person using 2 identifiers.  The patient was at home.  I discussed the limitations, risk, security and privacy concerns of performing an evaluation and management service by telephone and  the availability of in person appointments.  I also discussed with the patient that there may be a patient responsible charge related to this service.  The patient expressed understanding and agreed to proceed.   Chief Complaint: Roberta [name] is a 73 y.o. female with limited stage small cell lung cancer who is seen for 38-monthassessment.  HPI: The patient was last seen in the medical oncology clinic on 08/14/2018 by BHonor Loh NP. At that time, patient had seasonal allergy symptoms (rhinorrhea and postnasal drip).  Patient had chronic cough related to her COPD diagnosis and underlying malignancy.  She denied any B symptoms or recent infections.  Exam revealed scattered dry rales bilaterally. WBC 7400 ([id number]).  Calcium 8.8 mg/dL.  LDH was normal at 148 U/L.  CEA was 2.2.  She met with JJennet Maduro RD on 08/22/2018. She was encouraged to increase her caloric intake.   Chest CT on 11/12/2018 revealed expected evolution of post radiation pneumonitis/fibrosis in the right suprahilar region. There was no evidence of local recurrence or metastatic disease. There was no adenopathy. There was aortic atherosclerosis.  During the interim, the patient says she feels "pretty good."  She has had back pain for 2 weeks at the site of her previous procedure.  Back pain starts after standing for 5-10 minutes.  She denied any falls, or heavy lifting.  Her chronic cough has not imporved.    Past Medical History:  Diagnosis Date  . AAA (abdominal aortic aneurysm) ([place]   . Anemia   . Breast cancer ([place] 2002   left  . COPD (chronic obstructive pulmonary disease) ([place]   . Dyspnea   . Headache   . High cholesterol   . Personal history of chemotherapy   . Small cell lung cancer ([place]   . Tobacco abuse     Past Surgical History:  Procedure Laterality Date  . ABDOMINAL HYSTERECTOMY    . ARTERY BIOPSY Right 12/05/2017   Procedure: BIOPSY TEMPORAL ARTERY;  Surgeon: JMargaretha Sheffield MD;  Location: [facility] ORS;  Service: ENT;  Laterality: Right;  . ENDOBRONCHIAL ULTRASOUND N/A 02/19/2017   Procedure: ENDOBRONCHIAL ULTRASOUND;  Surgeon: KFlora Lipps MD;  Location: [facility] ORS;  Service: Cardiopulmonary;  Laterality: N/A;  . ENDOVASCULAR REPAIR/STENT GRAFT N/A 12/06/2016   Procedure: Endovascular Repair/Stent Graft;  Surgeon: SKatha Cabal MD;  [facility];  Service: Cardiovascular;  Laterality: N/A;  . IR FLUORO GUIDE PORT INSERTION RIGHT  03/02/2017  . KYPHOPLASTY N/A 06/11/2018   Procedure: KYPHOPLASTY L4 BIOPSY WITH RFA;  Surgeon: MHessie Knows MD;  Location: [facility] ORS;  Service: Orthopedics;  Laterality: N/A;  . MASTECTOMY Left 2002   with sentinel node     Family History  Problem Relation Age of Onset  . CAD Mother   . Colon cancer Brother   . Breast cancer Neg Hx     Social History:  reports that she has been smoking cigarettes. She has a 25.00 pack-year smoking history. She has never  used smokeless tobacco. She reports that she does not drink alcohol or use drugs. She is a 0.5 ppd smoker; formally 1 ppd smoker since age of 36. She is trying to quit smoking and has cut down to half a pack. 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 The patient's sister's name is [name].  Participants in the patient's visit and their role in the encounter included the patient, and Waymon Budge, RN today.  The intake visit was provided by Waymon Budge, RN.  Allergies: No Known Allergies  Current Medications: Current Outpatient Medications  Medication Sig Dispense Refill  . acetaminophen (TYLENOL) 325 MG tablet Take 650 mg by mouth every 6 (six) hours as needed for mild pain.     Marland Kitchen albuterol (2.5 MG/3ML) 0.083% NEBU 3 mL, albuterol (5 MG/ML) 0.5% NEBU 0.5 mL Inhale 1 mg into the lungs.    Marland Kitchen albuterol (PROVENTIL HFA;VENTOLIN HFA) 108 (90 Base) MCG/ACT inhaler Inhale 2 puffs into the lungs every 6 (six) hours as needed for wheezing or shortness of breath. 1 Inhaler 1  . aspirin EC 81 MG tablet Take 81 mg by mouth daily.    Marland Kitchen atorvastatin (LIPITOR) 10 MG tablet Take 10 mg by mouth at bedtime.     . fluticasone-salmeterol (ADVAIR HFA) 115-21 MCG/ACT inhaler Inhale 2 puffs into the lungs 2 (two) times daily.    Marland Kitchen ipratropium (ATROVENT HFA) 17 MCG/ACT inhaler Inhale 2 puffs into the lungs every 6 (six) hours as needed for wheezing. 1 Inhaler 12  . loratadine (CLARITIN) 10 MG tablet Take 1 tablet (10 mg total) by mouth daily. 30 tablet 1  . megestrol (MEGACE) 40 MG/ML suspension Take 5 mLs (200 mg total) by mouth daily. 480 mL 0  . Multiple Vitamin (MULTIVITAMIN) tablet Take 1 tablet by mouth daily.     No current facility-administered medications for this visit.    Facility-Administered Medications Ordered in Other Visits  Medication Dose Route Frequency Provider Last Rate Last Dose  . sodium chloride flush (NS) 0.9 % injection 10 mL  10 mL Intravenous PRN Sindy Guadeloupe, MD   10 mL at 03/26/17 0948  . sodium chloride flush (NS) 0.9 % injection 10 mL  10 mL Intravenous PRN Sindy Guadeloupe, MD   10 mL at 04/16/17 1006  . sodium chloride flush (NS) 0.9 % injection 10 mL  10 mL Intravenous PRN Sindy Guadeloupe, MD   10 mL at 01/10/18 1410    Review of Systems  Constitutional: Negative for diaphoresis, fever, malaise/fatigue and weight loss (up 4 pounds).       Feels "pretty good".  HENT: Negative.  Negative for congestion, nosebleeds,  sinus pain and sore throat.   Eyes: Negative.   Respiratory: Positive for cough (chronic). Negative for hemoptysis, sputum production and shortness of breath (exertional; stable).        COPD.  Continues to smoke on a daily basis.  Cardiovascular: Negative for chest pain, palpitations, orthopnea, leg swelling and PND.  Gastrointestinal: Negative for abdominal pain, blood in stool, constipation, diarrhea, melena, nausea and vomiting.  Genitourinary: Negative for dysuria, frequency, hematuria and urgency.  Musculoskeletal: Positive for back pain. Negative for falls, joint pain and myalgias.  Skin: Negative for itching and rash.  Neurological: Negative for dizziness, tremors, weakness and headaches.  Endo/Heme/Allergies: Positive for environmental allergies (sesonal). Does not bruise/bleed easily.  Psychiatric/Behavioral: Negative for depression, memory loss and suicidal ideas. The patient is not nervous/anxious and does not have insomnia.  All other systems reviewed and are negative.  Performance status (ECOG): 1-2  Infusion on 11/12/2018  Component Date Value Ref Range Status  . LDH 11/12/2018 146  98 - 192 U/L Final   Performed at [facility], [address] [place], [place] [address]  . CEA 11/12/2018 2.2  0.0 - 4.7 ng/mL Final   Comment: (NOTE)                             Nonsmokers          <3.9                             Smokers             <5.6 [employer] Electrochemiluminescence Immunoassay (ECLIA) Values obtained with different assay methods or kits cannot be used interchangeably.  Results cannot be interpreted as absolute evidence of the presence or absence of malignant disease. Performed At: [facility] [place], [place] [id number] Rush Farmer MD NO:[id number]   . Sodium 11/12/2018 132* 135 - 145 mmol/L Final  . Potassium 11/12/2018 3.9  3.5 - 5.1 mmol/L Final  . Chloride 11/12/2018 98  98 - 111 mmol/L Final  . CO2  11/12/2018 26  22 - 32 mmol/L Final  . Glucose, Bld 11/12/2018 99  70 - 99 mg/dL Final  . BUN 11/12/2018 16  8 - 23 mg/dL Final  . Creatinine, Ser 11/12/2018 0.93  0.44 - 1.00 mg/dL Final  . Calcium 11/12/2018 8.7* 8.9 - 10.3 mg/dL Final  . Total Protein 11/12/2018 6.8  6.5 - 8.1 g/dL Final  . Albumin 11/12/2018 3.6  3.5 - 5.0 g/dL Final  . AST 11/12/2018 19  15 - 41 U/L Final  . ALT 11/12/2018 13  0 - 44 U/L Final  . Alkaline Phosphatase 11/12/2018 83  38 - 126 U/L Final  . Total Bilirubin 11/12/2018 0.6  0.3 - 1.2 mg/dL Final  . GFR calc non Af Amer 11/12/2018 >60  >60 mL/min Final  . GFR calc Af Amer 11/12/2018 >60  >60 mL/min Final  . Anion gap 11/12/2018 8  5 - 15 Final   Performed at [facility], [address] [place], [place] [address]  . WBC 11/12/2018 6.6  4.0 - 10.5 K/uL Final  . RBC 11/12/2018 4.57  3.87 - 5.11 MIL/uL Final  . Hemoglobin 11/12/2018 13.7  12.0 - 15.0 g/dL Final  . HCT 11/12/2018 42.2  36.0 - 46.0 % Final  . MCV 11/12/2018 92.3  80.0 - 100.0 fL Final  . MCH 11/12/2018 30.0  26.0 - 34.0 pg Final  . MCHC 11/12/2018 32.5  30.0 - 36.0 g/dL Final  . RDW 11/12/2018 13.2  11.5 - 15.5 % Final  . Platelets 11/12/2018 208  150 - 400 K/uL Final  . nRBC 11/12/2018 0.0  0.0 - 0.2 % Final  . Neutrophils Relative % 11/12/2018 61  % Final  . Neutro Abs 11/12/2018 4.0  1.7 - 7.7 K/uL Final  . Lymphocytes Relative 11/12/2018 28  % Final  . Lymphs Abs 11/12/2018 1.8  0.7 - 4.0 K/uL Final  . Monocytes Relative 11/12/2018 9  % Final  . Monocytes Absolute 11/12/2018 0.6  0.1 - 1.0 K/uL Final  . Eosinophils Relative 11/12/2018 1  % Final  . Eosinophils Absolute 11/12/2018 0.1  0.0 - 0.5 K/uL Final  .  Basophils Relative 11/12/2018 0  % Final  . Basophils Absolute 11/12/2018 0.0  0.0 - 0.1 K/uL Final  . Immature Granulocytes 11/12/2018 1  % Final  . Abs Immature Granulocytes 11/12/2018 0.04  0.00 - 0.07 K/uL Final   Performed at [facility],  [address], [place], [place] [address]    Assessment:  ADNA NOFZIGER is a 73 y.o. female with limited stage small cell lung cancer. She had stage IIIB (T3N2M0) disease.  She received cisplatin and etoposide from 03/05/2017 - 05/14/2017.   She received radiation from  03/08/2017 - 04/23/2017.  Chest, abdomen, and pelvic CT on 05/30/2017 revealed continued improvement in the right upper lobe mass, right hilar and mediastinal lymphadenopathy consistent with response to therapy.  there was interval development of multiple small ill-defined peribronchovascular nodules in both lungs, greatest in the right upper lobe. Some of these were clustered, and were not associated with the bronchovascular bundles such that lymphangitic tumor was unlikely. These were likely inflammatory.   There was no evidence of metastatic disease in the abdomen or pelvis  She received PCI from 06/19/2017 - 07/11/2017.  Chest, abdomen, and pelvic CT on 06/07/2018 revealed interval development of L4 vertebral body burst fracture with mild retropulsion of the posterior aspect of the vertebral body.  There was associated sclerosis of the L4 vertebral body with significant height loss predominately involving the anterior and mid aspect of the vertebral body. Possibility of pathologic fracture was not excluded. There was a nondisplaced right L4 transverse process fracture. Recommend further evaluation with lumbar spine MRI.  There were findings suggestive of mild acute sigmoid colonic diverticulitis.  There was similar to mild interval increase in size of previously described lesion within the right upper lobe (3.1 x 1.8 cm to 3.2 x 2.2 cm).  Adjacent irregular nodule along the inferior margin within the right upper lobe was grossly similar when compared to prior (1.9 x 1.1 cm to 1.2 x 2.0 cm).  Additional areas of consolidation within the right lung were grossly similar.  There was interval improvement in previously described  tree-in-bud nodularity within the left lower lobe with slight interval increase in ground-glass nodularity within the right lower lobe suggestive of atypical infectious process (MAI).  PET scan on 08/12/2018 revealed a 3.1 x 3.2 focus area of opacity in the RIGHT upper lobe (SUV 2.0).  Additionally, there was a 1.9 x 2.2 cm perihilar RIGHT lung lesion extending into the superior aspect of the RIGHT hilum (SUV 1.8).  Both areas considered to be infectious/inflammatory, however low-grade neoplasm cannot be excluded.  Short-term follow-up recommended.  Chest CT on 11/12/2018 revealed expected evolution of post radiation pneumonitis/fibrosis in the right suprahilar region. There was no evidence of local recurrence or metastatic disease. There was no adenopathy. There was aortic atherosclerosis.  She underwent kyphoplasty of L4 with radiofrequency ablation on 06/11/2018. Pathology revealed viable bone and trilineage metabolic marrow with reactive changes and focal changes of fracture callus. IHC stains for pancytokeratin were negative. There was no evidence of metastatic disease.  She has a history of left breast cancer in 2002.  She underwent left mastectomy followed by adjuvant chemotherapy. She was treated by Dr. Oliva Bustard. She did not receive adjuvant radiation.  Symptomatically, she feels "pretty good".  She has back pain s/p kyphoplasty.  Plan: 1.   Labs today: CBC with differential, CMP, CEA, LDH. 2.   Limited stage SCLC Clinically, she is doing well. Review interval chest CT.  Images personally reviewed.  Agree with radiology interpretation.  No evidence of recurrence. 3.   L4 burst fracture status post kyphoplasty Back discomfort at site of kyphoplasty x 2 weeks. Discuss follow-up with Dr. Rudene Christians - message left. 4.   RTC in 1 month for MD assessment.  I discussed the assessment and treatment plan with the patient.  The patient was provided an opportunity to ask questions and all were  answered.  The patient agreed with the plan and demonstrated an understanding of the instructions.  The patient was advised to call back if the symptoms worsen or if the condition fails to improve as anticipated.  I provided 10 minutes of non face-to-face time during this this encounter and > 50% was spent counseling as documented under my assessment and plan.    Lequita Asal, MD, PhD    11/14/2018, 11:55 AM  I, Molly Dorshimer, am acting as Education administrator for [employer]. Mike Gip, MD, PhD.  I, Melissa C. Mike Gip, MD, have reviewed the above documentation for accuracy and completeness, and I agree with the above.

## 2018-11-14 ENCOUNTER — Inpatient Hospital Stay (HOSPITAL_BASED_OUTPATIENT_CLINIC_OR_DEPARTMENT_OTHER): Payer: Medicare HMO | Admitting: Hematology and Oncology

## 2018-11-14 DIAGNOSIS — M545 Low back pain, unspecified: Secondary | ICD-10-CM

## 2018-11-14 DIAGNOSIS — C3411 Malignant neoplasm of upper lobe, right bronchus or lung: Secondary | ICD-10-CM | POA: Diagnosis not present

## 2018-11-14 DIAGNOSIS — E43 Unspecified severe protein-calorie malnutrition: Secondary | ICD-10-CM | POA: Diagnosis not present

## 2018-11-14 DIAGNOSIS — Z7189 Other specified counseling: Secondary | ICD-10-CM | POA: Diagnosis not present

## 2018-11-14 DIAGNOSIS — G8929 Other chronic pain: Secondary | ICD-10-CM

## 2018-11-14 NOTE — Progress Notes (Signed)
Pt here for follow up. Reports some back pain from lumbar surgery x 2-3 weeks. Denies any other concerns.

## 2018-11-19 ENCOUNTER — Other Ambulatory Visit: Payer: Self-pay | Admitting: *Deleted

## 2018-12-02 ENCOUNTER — Other Ambulatory Visit: Payer: Self-pay

## 2018-12-02 ENCOUNTER — Emergency Department
Admission: EM | Admit: 2018-12-02 | Discharge: 2018-12-02 | Disposition: A | Discharge status: Home / Self Care | Payer: Medicare HMO | Attending: Emergency Medicine | Admitting: Emergency Medicine

## 2018-12-02 DIAGNOSIS — Z79899 Other long term (current) drug therapy: Secondary | ICD-10-CM | POA: Insufficient documentation

## 2018-12-02 DIAGNOSIS — J449 Chronic obstructive pulmonary disease, unspecified: Secondary | ICD-10-CM | POA: Insufficient documentation

## 2018-12-02 DIAGNOSIS — I959 Hypotension, unspecified: Secondary | ICD-10-CM | POA: Diagnosis present

## 2018-12-02 DIAGNOSIS — F17210 Nicotine dependence, cigarettes, uncomplicated: Secondary | ICD-10-CM | POA: Diagnosis not present

## 2018-12-02 DIAGNOSIS — E860 Dehydration: Secondary | ICD-10-CM | POA: Insufficient documentation

## 2018-12-02 DIAGNOSIS — Z7982 Long term (current) use of aspirin: Secondary | ICD-10-CM | POA: Insufficient documentation

## 2018-12-02 LAB — BASIC METABOLIC PANEL
Anion gap: 9 (ref 5–15)
BUN: 17 mg/dL (ref 8–23)
CO2: 25 mmol/L (ref 22–32)
Calcium: 8.8 mg/dL — ABNORMAL LOW (ref 8.9–10.3)
Chloride: 105 mmol/L (ref 98–111)
Creatinine, Ser: 0.73 mg/dL (ref 0.44–1.00)
GFR calc Af Amer: >60 mL/min (ref >60)
GFR calc non Af Amer: >60 mL/min (ref >60)
Glucose, Bld: 107 mg/dL — ABNORMAL HIGH (ref 70–99)
Potassium: 3.9 mmol/L (ref 3.5–5.1)
Sodium: 139 mmol/L (ref 135–145)

## 2018-12-02 LAB — CBC
HCT: 40.9 % (ref 36.0–46.0)
Hemoglobin: 13.3 g/dL (ref 12.0–15.0)
MCH: 29.6 pg (ref 26.0–34.0)
MCHC: 32.5 g/dL (ref 30.0–36.0)
MCV: 91.1 fL (ref 80.0–100.0)
Platelets: 255 10*3/uL (ref 150–400)
RBC: 4.49 MIL/uL (ref 3.87–5.11)
RDW: 13.3 % (ref 11.5–15.5)
WBC: 6.6 10*3/uL (ref 4.0–10.5)
nRBC: 0 % (ref 0.0–0.2)

## 2018-12-02 MED ORDER — SODIUM CHLORIDE 0.9 % IV BOLUS
500.0000 mL | Freq: Once | INTRAVENOUS | Status: AC
  Administered 2018-12-02: 17:00:00 500 mL via INTRAVENOUS

## 2018-12-02 MED ORDER — SODIUM CHLORIDE 0.9% FLUSH: 3.0000 mL | Freq: Once | INTRAVENOUS | Status: DC

## 2018-12-02 NOTE — ED Notes (Signed)
Family called and asked that we call patient's son Doren Custard or his girlfriend 15-20 min prior to discharge.  GF Janett Billow) phone number is: [phone].

## 2018-12-02 NOTE — ED Provider Notes (Signed)
[facility] Emergency Department Provider Note   ____________________________________________   First MD Initiated Contact with Patient 12/02/18 1626     (approximate)  I have reviewed the triage vital signs and the nursing notes.   HISTORY  Chief Complaint Hypotension    HPI Roberta [name] is a 73 y.o. female who has a history of small cell lung cancer, abdominal aneurysm, COPD  Patient here for evaluation of low blood pressure at the clinic.  She reports she went for a physical exam she is not been having any symptoms.  They told her blood pressure was low in the 80s injected a couple times and referred her here.  She denies any cough cold chills fever chest pain trouble breathing or other symptoms.  On careful discussion it does seem she has had some lightheadedness when she stands for about a month.  She reports she is eating, she does not eat a lot, but she overall is doing well.  She reports she feels good at the moment, but sometimes when she stands to feel little lightheaded.   Past Medical History:  Diagnosis Date  . AAA (abdominal aortic aneurysm) ([place])   . Anemia   . Breast cancer ([place]) 2002   left  . COPD (chronic obstructive pulmonary disease) ([place])   . Dyspnea   . Headache   . High cholesterol   . Personal history of chemotherapy   . Small cell lung cancer ([place])   . Tobacco abuse     Patient Active Problem List   Diagnosis Date Noted  . Closed stable burst fracture of fourth lumbar vertebra ([place]) 06/08/2018  . Severe protein-calorie malnutrition ([place]) 10/29/2017  . Sepsis ([place]) 04/28/2017  . Small cell lung cancer, right upper lobe ([place]) 02/23/2017  . Goals of care, counseling/discussion 02/23/2017  . Lung mass   . AAA (abdominal aortic aneurysm) without rupture ([place]) 11/02/2016  . COPD mixed type ([place]) 11/02/2016  . Hyperlipidemia 11/02/2016  . PAD (peripheral artery disease) ([place]) 11/02/2016    Past Surgical History:   Procedure Laterality Date  . ABDOMINAL HYSTERECTOMY    . ARTERY BIOPSY Right 12/05/2017   Procedure: BIOPSY TEMPORAL ARTERY;  Surgeon: Margaretha Sheffield, MD;  Location: [facility] ORS;  Service: ENT;  Laterality: Right;  . ENDOBRONCHIAL ULTRASOUND N/A 02/19/2017   Procedure: ENDOBRONCHIAL ULTRASOUND;  Surgeon: Flora Lipps, MD;  Location: [facility] ORS;  Service: Cardiopulmonary;  Laterality: N/A;  . ENDOVASCULAR REPAIR/STENT GRAFT N/A 12/06/2016   Procedure: Endovascular Repair/Stent Graft;  Surgeon: Katha Cabal, MD;  Location: [facility];  Service: Cardiovascular;  Laterality: N/A;  . IR FLUORO GUIDE PORT INSERTION RIGHT  03/02/2017  . KYPHOPLASTY N/A 06/11/2018   Procedure: KYPHOPLASTY L4 BIOPSY WITH RFA;  Surgeon: Hessie Knows, MD;  Location: [facility] ORS;  Service: Orthopedics;  Laterality: N/A;  . MASTECTOMY Left 2002   with sentinel node     Prior to Admission medications   Medication Sig Start Date End Date Taking? Authorizing Provider  acetaminophen (TYLENOL) 325 MG tablet Take 650 mg by mouth every 6 (six) hours as needed for mild pain.     [provider]  albuterol (2.5 MG/3ML) 0.083% NEBU 3 mL, albuterol (5 MG/ML) 0.5% NEBU 0.5 mL Inhale 1 mg into the lungs.    [provider]  albuterol (PROVENTIL HFA;VENTOLIN HFA) 108 (90 Base) MCG/ACT inhaler Inhale 2 puffs into the lungs every 6 (six) hours as needed for wheezing or shortness of breath. 03/23/16   Alveta Heimlich,  Cornelia Copa, MD  aspirin EC 81 MG tablet Take 81 mg by mouth daily.    [provider]  atorvastatin (LIPITOR) 10 MG tablet Take 10 mg by mouth at bedtime.  08/11/16   [provider]  fluticasone-salmeterol (ADVAIR HFA) 115-21 MCG/ACT inhaler Inhale 2 puffs into the lungs 2 (two) times daily.    [provider]  ipratropium (ATROVENT HFA) 17 MCG/ACT inhaler Inhale 2 puffs into the lungs every 6 (six) hours as needed for wheezing. 07/18/18   Noreene Filbert, MD  loratadine (CLARITIN) 10 MG  tablet Take 1 tablet (10 mg total) by mouth daily. 11/12/18   Lequita Asal, MD  megestrol (MEGACE) 40 MG/ML suspension Take 5 mLs (200 mg total) by mouth daily. 07/25/18   Karen Kitchens, NP  Multiple Vitamin (MULTIVITAMIN) tablet Take 1 tablet by mouth daily.    [provider]    Allergies Patient has no known allergies.  Family History  Problem Relation Age of Onset  . CAD Mother   . Colon cancer Brother   . Breast cancer Neg Hx     Social History Social History   Tobacco Use  . Smoking status: Current Every Day Smoker    Packs/day: 0.50    Years: 50.00    Pack years: 25.00    Types: Cigarettes  . Smokeless tobacco: Never Used  Substance Use Topics  . Alcohol use: No  . Drug use: No    Review of Systems Constitutional: No fever/chills no exposure to COVID Eyes: No visual changes. ENT: No sore throat. Cardiovascular: Denies chest pain. Respiratory: Denies shortness of breath. Gastrointestinal: No abdominal pain.   Genitourinary: Negative for dysuria. Musculoskeletal: Negative for back pain. Skin: Negative for rash. Neurological: Negative for headaches, areas of focal weakness or numbness.  Occasionally gets slightly lightheaded when she first gets up to stand.    ____________________________________________   PHYSICAL EXAM:  VITAL SIGNS: ED Triage Vitals  Enc Vitals Group     BP 12/02/18 1245 (!) 99/51     Pulse Rate 12/02/18 1245 100     Resp 12/02/18 1245 16     Temp 12/02/18 1245 99 F (37.2 C)     Temp Source 12/02/18 1245 Oral     SpO2 12/02/18 1245 92 %     Weight 12/02/18 1246 120 lb (54.4 kg)     Height 12/02/18 1246 5\' 7"  (1.702 m)     Head Circumference --      Peak Flow --      Pain Score 12/02/18 1246 0     Pain Loc --      Pain Edu? --      Excl. in [place]? --     Constitutional: Alert and oriented. Well appearing and in no acute distress.  Somewhat frail in appearance, but in no distress very pleasant and resting.  She like  something to eat. Eyes: Conjunctivae are normal. Head: Atraumatic. Nose: No congestion/rhinnorhea. Mouth/Throat: Mucous membranes are moist. Neck: No stridor.  Cardiovascular: Normal rate, regular rhythm. Grossly normal heart sounds.  Good peripheral circulation. Respiratory: Normal respiratory effort.  No retractions. Lungs CTAB. Gastrointestinal: Soft and nontender. No distention. Musculoskeletal: No lower extremity tenderness nor edema. Neurologic:  Normal speech and language. No gross focal neurologic deficits are appreciated.  Skin:  Skin is warm, dry and intact. No rash noted. Psychiatric: Mood and affect are normal. Speech and behavior are normal.  ____________________________________________   LABS (all labs ordered are listed, but only abnormal  results are displayed)  Labs Reviewed  BASIC METABOLIC PANEL - Abnormal; Notable for the following components:      Result Value   Glucose, Bld 107 (*)    Calcium 8.8 (*)    All other components within normal limits  CBC  URINALYSIS, COMPLETE (UACMP) WITH MICROSCOPIC   ____________________________________________  EKG  Reviewed entered by me at 1255 Heart rate 100 QRS 100 QTc 440 Minimal sinus tachycardia, no evidence of acute ischemia.  Probable LVH ____________________________________________  RADIOLOGY  No results found.  No indication for imaging denoted. ____________________________________________   PROCEDURES  Procedure(s) performed: None  Procedures  Critical Care performed: No  ____________________________________________   INITIAL IMPRESSION / ASSESSMENT AND PLAN / ED COURSE  Pertinent labs & imaging results that were available during my care of the patient were reviewed by me and considered in my medical decision making (see chart for details).   Patient presents for hypotension.  She went for a physical examination, she reports being asymptomatic except on close questioning is had some  lightheadedness with standing for about a month.  She also reports she had not really had anything to eat this morning had one egg and a sip of water before going to the clinic.  She may be slightly dehydrated, overall very reassuring exam, she does drop her blood pressure though with standing but does not become symptomatic here.  I will give her a fluid bolus and will reassess.  Anticipate stability discharge as she is essentially asymptomatic.  He has follow-up reports already when appointment on Wednesday where she is following up with oncology as well  Clinical Course as of Dec 02 1927  Mon Dec 02, 2018  1630 BP 131/66 now. She has no complaints. Reports just went for physical and had no complaints. Justed noted a low BP at clinic. She is asymptomatic but with standing for 2 min BP drops to 85/51.    [MQ]    Clinical Course User Index [MQ] Delman Kitten, MD    ----------------------------------------- 7:29 PM on 12/02/2018 -----------------------------------------  Still somewhat orthostatic but completely asymptomatic.  Discussed with patient, offered to give additional IV fluid but patient reports he feels perfectly fine would like to build to go home and just make sure that she eats well and adds extra water.  I think this is reasonable.  She will try to take at least 2-3 extra glasses of water a day.  She has follow-up already on Wednesday with her oncology doctor.   Vitals:   12/02/18 1922 12/02/18 1925  BP: (!) 128/58 103/63  Pulse: 88 92  Resp: 20 20  Temp:    SpO2: 94% 95%   Return precautions and treatment recommendations and follow-up discussed with the patient who is agreeable with the plan.  Son coming to pick her up ____________________________________________   FINAL CLINICAL IMPRESSION(S) / ED DIAGNOSES  Final diagnoses:  Dehydration  Hypotension, orthostatic      Note:  This document was prepared using Dragon voice recognition software and may include  unintentional dictation errors       Delman Kitten, MD 12/02/18 1930

## 2018-12-02 NOTE — ED Notes (Signed)
Date and time results received: 12/02/18 7:29 PM  (use smartphrase ".now" to insert current time)  Test: Orthostatic BP Critical Value: See VS  Name of Provider Notified: Quale  Orders Received? Or Actions Taken?: Pt continues to be orthostatic, EDP made aware.

## 2018-12-02 NOTE — ED Notes (Signed)
MD Quale reports he completed orthostatic vital signs. Positive orthostatics

## 2018-12-02 NOTE — ED Notes (Signed)
Pt given sandwich tray and coke

## 2018-12-02 NOTE — ED Triage Notes (Signed)
Pt states she went to there PCP today for a routine physical and her b/p was 84/45. Denies any recent illness, denies dizziness or other sx.

## 2018-12-03 ENCOUNTER — Telehealth: Payer: Self-pay

## 2018-12-03 NOTE — Telephone Encounter (Signed)
Contacted patient to inquire on how she is feeling after ED visit. Patient reports she is feeling fine and does not feel like she needs to be seen prior to 12/12/18. Advised patient to reach out to our facility if she feels she needs to be seen sooner. Patient verbalizes understanding and denies any further questions or concerns.

## 2018-12-04 ENCOUNTER — Inpatient Hospital Stay: Payer: Medicare HMO

## 2018-12-08 ENCOUNTER — Encounter: Payer: Self-pay | Admitting: Hematology and Oncology

## 2018-12-09 ENCOUNTER — Other Ambulatory Visit: Payer: Self-pay

## 2018-12-09 DIAGNOSIS — C3411 Malignant neoplasm of upper lobe, right bronchus or lung: Secondary | ICD-10-CM

## 2018-12-09 NOTE — Progress Notes (Signed)
[facility]  [address] [place], [place] [address] Phone: [phone]  Fax: [phone]   Clinic Day:  12/11/2018  Referring physician: Gayland Curry, MD  Chief Complaint: TANAYA DUNIGAN is a 73 y.o. female with limited stage small cell lung cancer who is seen for41-month assessment.  HPI: The patient was last seen in the medical oncology clinic via telephone on 11/14/2018. At that time, she felt "pretty good".  She had back pain s/p kyphoplasty.  She presented to the ER on 12/02/2018 for asymptomatic low blood pressure of 84/45 after she was seen for a routine physical exam at her PCP. She was given IV fluids and discharged.   Labs on 12/02/2018 revealed a WBC 6,600, hemoglobin 13.3, hematocrit 40.9, platelets 255,000. Calcium 8.8.   During the interim, she is doing "fairly okay." She reports difficulty sleeping at night, and takes a nap during the day. Shortness of breath is stable, and she has an occasional productive cough. She reports continued back pain s/p kyphoplasty, and cannot stand for more than 5-10 minutes.   Her blood pressure in the clinic today is 135/68. She denies any dizziness or lightheadedness. She has gained 11lbs in the last 4 months. She is eating better and using Boost supplemental drinks. She continues to smoke 1/2ppd.    Past Medical History:  Diagnosis Date  . AAA (abdominal aortic aneurysm) ([place])   . Anemia   . Breast cancer ([place]) 2002   left  . COPD (chronic obstructive pulmonary disease) ([place])   . Dyspnea   . Headache   . High cholesterol   . Personal history of chemotherapy   . Small cell lung cancer ([place])   . Tobacco abuse     Past Surgical History:  Procedure Laterality Date  . ABDOMINAL HYSTERECTOMY    . ARTERY BIOPSY Right 12/05/2017   Procedure: BIOPSY TEMPORAL ARTERY;  Surgeon: Margaretha Sheffield, MD;  Location: [facility] ORS;  Service: ENT;  Laterality: Right;  . ENDOBRONCHIAL ULTRASOUND N/A  02/19/2017   Procedure: ENDOBRONCHIAL ULTRASOUND;  Surgeon: Flora Lipps, MD;  Location: [facility] ORS;  Service: Cardiopulmonary;  Laterality: N/A;  . ENDOVASCULAR REPAIR/STENT GRAFT N/A 12/06/2016   Procedure: Endovascular Repair/Stent Graft;  Surgeon: Katha Cabal, MD;  Location: [facility];  Service: Cardiovascular;  Laterality: N/A;  . IR FLUORO GUIDE PORT INSERTION RIGHT  03/02/2017  . KYPHOPLASTY N/A 06/11/2018   Procedure: KYPHOPLASTY L4 BIOPSY WITH RFA;  Surgeon: Hessie Knows, MD;  Location: [facility] ORS;  Service: Orthopedics;  Laterality: N/A;  . MASTECTOMY Left 2002   with sentinel node     Family History  Problem Relation Age of Onset  . CAD Mother   . Colon cancer Brother   . Breast cancer Neg Hx     Social History:  reports that she has been smoking cigarettes. She has a 25.00 pack-year smoking history. She has never used smokeless tobacco. She reports that she does not drink alcohol or use drugs. She is a 0.5 ppd smoker; formally 1 ppd smoker since age of 33. She is trying to quit smoking and has cut down to half a pack. 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 The patient's sister's name is [name].  Allergies: No Known Allergies  Current Medications: Current Outpatient Medications  Medication Sig Dispense Refill  . acetaminophen (TYLENOL) 325 MG tablet Take 650 mg by mouth every 6 (six) hours as needed for mild pain.     Marland Kitchen albuterol (  2.5 MG/3ML) 0.083% NEBU 3 mL, albuterol (5 MG/ML) 0.5% NEBU 0.5 mL Inhale 1 mg into the lungs.    Marland Kitchen albuterol (PROVENTIL HFA;VENTOLIN HFA) 108 (90 Base) MCG/ACT inhaler Inhale 2 puffs into the lungs every 6 (six) hours as needed for wheezing or shortness of breath. 1 Inhaler 1  . aspirin EC 81 MG tablet Take 81 mg by mouth daily.    Marland Kitchen atorvastatin (LIPITOR) 10 MG tablet Take 10 mg by mouth at bedtime.     . fluticasone-salmeterol (ADVAIR HFA) 115-21 MCG/ACT inhaler Inhale 2 puffs into the lungs 2 (two) times daily.    Marland Kitchen  loratadine (CLARITIN) 10 MG tablet Take 1 tablet (10 mg total) by mouth daily. 30 tablet 1  . megestrol (MEGACE) 40 MG/ML suspension Take 5 mLs (200 mg total) by mouth daily. 480 mL 0  . Multiple Vitamin (MULTIVITAMIN) tablet Take 1 tablet by mouth daily.    Marland Kitchen ipratropium (ATROVENT HFA) 17 MCG/ACT inhaler Inhale 2 puffs into the lungs every 6 (six) hours as needed for wheezing. 1 Inhaler 12   No current facility-administered medications for this visit.    Facility-Administered Medications Ordered in Other Visits  Medication Dose Route Frequency Provider Last Rate Last Dose  . heparin lock flush 100 unit/mL  500 Units Intravenous Once ,  C, MD      . sodium chloride flush (NS) 0.9 % injection 10 mL  10 mL Intravenous PRN Sindy Guadeloupe, MD   10 mL at 03/26/17 0948  . sodium chloride flush (NS) 0.9 % injection 10 mL  10 mL Intravenous PRN Sindy Guadeloupe, MD   10 mL at 04/16/17 1006  . sodium chloride flush (NS) 0.9 % injection 10 mL  10 mL Intravenous PRN Sindy Guadeloupe, MD   10 mL at 01/10/18 1410  . sodium chloride flush (NS) 0.9 % injection 10 mL  10 mL Intravenous PRN Lequita Asal, MD        Review of Systems  Constitutional: Positive for malaise/fatigue (improving). Negative for diaphoresis, fever and weight loss (up 11lbs since March).       Feels "fairly okay."  HENT: Negative.  Negative for congestion, hearing loss, nosebleeds, sinus pain and sore throat.   Eyes: Negative.  Negative for blurred vision.  Respiratory: Positive for cough (chronic, productive), sputum production and shortness of breath (exertional; stable). Negative for hemoptysis.        COPD.  Continues to smoke on a daily basis.  Cardiovascular: Negative for chest pain, palpitations, orthopnea, leg swelling and PND.  Gastrointestinal: Negative for abdominal pain, blood in stool, constipation, diarrhea, melena, nausea and vomiting.  Genitourinary: Negative for dysuria, frequency, hematuria and  urgency.  Musculoskeletal: Positive for back pain (s/p kyphoplasty). Negative for falls, joint pain and myalgias.  Skin: Negative for itching and rash.  Neurological: Negative for dizziness, tingling, sensory change, weakness and headaches.  Endo/Heme/Allergies: Positive for environmental allergies (sesonal). Does not bruise/bleed easily.  Psychiatric/Behavioral: Negative for depression, memory loss and substance abuse. The patient has insomnia (difficulty sleeping). The patient is not nervous/anxious.   All other systems reviewed and are negative.  Performance status (ECOG): 1-2  Vitals Blood pressure 135/68, pulse 79, temperature 97.7 F (36.5 C), temperature source Tympanic, resp. rate 18, weight 123 lb 7.3 oz (56 kg), SpO2 100 %.   Physical Exam  Constitutional: She is oriented to person, place, and time. She appears well-developed and well-nourished. No distress.  HENT:  Head: Normocephalic and atraumatic.  Mouth/Throat:  Oropharynx is clear and moist. No oropharyngeal exudate.  Short gray hair. Mask.  Eyes: Pupils are equal, round, and reactive to light. Conjunctivae and EOM are normal. No scleral icterus.  Neck: Normal range of motion. Neck supple.  Cardiovascular: Normal rate, regular rhythm and normal heart sounds.  No murmur heard. Pulmonary/Chest: Effort normal and breath sounds normal. No respiratory distress. She has no wheezes.  Abdominal: Soft. Bowel sounds are normal. She exhibits no distension. There is no abdominal tenderness.  Musculoskeletal: Normal range of motion.        General: No edema.     Lumbar back: She exhibits tenderness and bony tenderness.  Lymphadenopathy:    She has no cervical adenopathy.    She has no axillary adenopathy.       Right: No supraclavicular adenopathy present.       Left: No supraclavicular adenopathy present.  Neurological: She is alert and oriented to person, place, and time.  Skin: Skin is warm and dry. She is not diaphoretic. No  erythema.  Psychiatric: She has a normal mood and affect. Her behavior is normal. Judgment and thought content normal.  Nursing note and vitals reviewed.   Infusion on 12/11/2018  Component Date Value Ref Range Status  . LDH 12/11/2018 140  98 - 192 U/L Final   Performed at [facility], [address], [place], [place] [address]  . Sodium 12/11/2018 136  135 - 145 mmol/L Final  . Potassium 12/11/2018 3.9  3.5 - 5.1 mmol/L Final  . Chloride 12/11/2018 101  98 - 111 mmol/L Final  . CO2 12/11/2018 26  22 - 32 mmol/L Final  . Glucose, Bld 12/11/2018 101* 70 - 99 mg/dL Final  . BUN 12/11/2018 16  8 - 23 mg/dL Final  . Creatinine, Ser 12/11/2018 0.69  0.44 - 1.00 mg/dL Final  . Calcium 12/11/2018 8.8* 8.9 - 10.3 mg/dL Final  . Total Protein 12/11/2018 6.5  6.5 - 8.1 g/dL Final  . Albumin 12/11/2018 3.3* 3.5 - 5.0 g/dL Final  . AST 12/11/2018 18  15 - 41 U/L Final  . ALT 12/11/2018 15  0 - 44 U/L Final  . Alkaline Phosphatase 12/11/2018 92  38 - 126 U/L Final  . Total Bilirubin 12/11/2018 0.4  0.3 - 1.2 mg/dL Final  . GFR calc non Af Amer 12/11/2018 >60  >60 mL/min Final  . GFR calc Af Amer 12/11/2018 >60  >60 mL/min Final  . Anion gap 12/11/2018 9  5 - 15 Final   Performed at [facility], [address], [place], [place] [address]  . WBC 12/11/2018 6.5  4.0 - 10.5 K/uL Final  . RBC 12/11/2018 4.34  3.87 - 5.11 MIL/uL Final  . Hemoglobin 12/11/2018 13.0  12.0 - 15.0 g/dL Final  . HCT 12/11/2018 40.2  36.0 - 46.0 % Final  . MCV 12/11/2018 92.6  80.0 - 100.0 fL Final  . MCH 12/11/2018 30.0  26.0 - 34.0 pg Final  . MCHC 12/11/2018 32.3  30.0 - 36.0 g/dL Final  . RDW 12/11/2018 13.3  11.5 - 15.5 % Final  . Platelets 12/11/2018 253  150 - 400 K/uL Final  . nRBC 12/11/2018 0.0  0.0 - 0.2 % Final  . Neutrophils Relative % 12/11/2018 60  % Final  . Neutro Abs 12/11/2018 4.0  1.7 - 7.7 K/uL Final  . Lymphocytes Relative 12/11/2018 28  % Final  . Lymphs Abs  12/11/2018 1.8  0.7 - 4.0 K/uL Final  .  Monocytes Relative 12/11/2018 8  % Final  . Monocytes Absolute 12/11/2018 0.5  0.1 - 1.0 K/uL Final  . Eosinophils Relative 12/11/2018 2  % Final  . Eosinophils Absolute 12/11/2018 0.1  0.0 - 0.5 K/uL Final  . Basophils Relative 12/11/2018 1  % Final  . Basophils Absolute 12/11/2018 0.0  0.0 - 0.1 K/uL Final  . Immature Granulocytes 12/11/2018 1  % Final  . Abs Immature Granulocytes 12/11/2018 0.03  0.00 - 0.07 K/uL Final   Performed at [facility], [address] [place], [place] [address]    Assessment:  ARNECIA ECTOR is a 73 y.o. female with limited stage small cell lung cancer. She had stage IIIB (T3N2M0) disease. She received cisplatin and etoposidefrom 03/05/2017 - 05/14/2017. She received radiationfrom 03/08/2017 - 04/23/2017.  Chest, abdomen, and pelvic CTon 05/30/2017 revealed continued improvement in the right upper lobe mass, right hilar and mediastinal lymphadenopathy consistent with response to therapy. there was interval development of multiple small ill-defined peribronchovascular nodules in both lungs, greatest in the right upper lobe. Some of these were clustered, and were not associated with the bronchovascular bundles such that lymphangitic tumor was unlikely. These were likely inflammatory. There was no evidence of metastatic disease in the abdomen or pelvis  She received PCIfrom 06/19/2017 - 07/11/2017.  Chest, abdomen, and pelvic CTon 06/07/2018 revealed interval development of L4 vertebral body burst fracturewith mild retropulsion of the posterior aspect of the vertebral body. There was associated sclerosis of the L4 vertebral body with significant height loss predominately involving the anterior and mid aspect of the vertebral body. Possibility of pathologic fracture was not excluded. There was a nondisplacedright L4 transverse process fracture. Recommend further evaluation with lumbar spine MRI.  There were findings suggestive of mild acute sigmoid colonic diverticulitis. There was similar to mild interval increase in size of previously described lesion within the right upper lobe (3.1 x 1.8 cm to 3.2 x 2.2 cm). Adjacent irregular nodulealong the inferior margin within the right upper lobe was grossly similar when compared to prior (1.9 x 1.1 cm to 1.2 x 2.0 cm). Additional areas of consolidation within the right lung were grossly similar. There was interval improvement in previously described tree-in-bud nodularity within the left lower lobe with slight interval increase in ground-glass nodularity within the right lower lobe suggestive of atypical infectious process (MAI).  PETscanon 08/12/2018 revealed a 3.1 x 3.2 focus area of opacity in the RIGHTupper lobe (SUV 2.0). Additionally, there was a 1.9 x 2.2 cm perihilar RIGHTlung lesion extending into the superior aspect of the RIGHThilum (SUV 1.8). Both areas considered to be infectious/inflammatory, however low-grade neoplasm cannot be excluded. Short-term follow-up recommended.  Chest CT on 11/12/2018 revealed expected evolution of post radiation pneumonitis/fibrosis in the right suprahilar region. There was no evidence of local recurrence or metastatic disease. There was no adenopathy. There was aortic atherosclerosis.  She underwent kyphoplasty of L4with radiofrequency ablation on 06/11/2018. Pathologyrevealed viable bone and trilineage metabolic marrow with reactive changes and focal changes of fracture callus. IHC stains for pancytokeratin were negative. There was no evidence of metastatic disease.  She has a history of left breast cancerin 2002. She underwent left mastectomy followed by adjuvant chemotherapy. She was treated by Dr. Oliva Bustard. She did not receive adjuvant radiation.  Symptomatically, she feels "fairly well".  She continues to have some back pain.  Plan: 1.   Labs today: CBC with diff, CMP, CEA, LDH.  2.   Limited stage SCLC Clinically she is  doing well.  Continue close surveillance. 3.   L4 burst fracture s/p kyphoplasty Etiology of back pain unclear. Lumbar spine films today. Contact [facility] orth[facility]ics clinic-done. Patient has an appointment with Dr. Wells Guiles, PA at the [facility] orthopedics clinic tomorrow at 2 PM. 4.   RTC in 2 months for MD assessment, port flush and labs (CBC with diff, CMP).  I discussed the assessment and treatment plan with the patient.  The patient was provided an opportunity to ask questions and all were answered.  The patient agreed with the plan and demonstrated an understanding of the instructions.  The patient was advised to call back if the symptoms worsen or if the condition fails to improve as anticipated.  I provided 15 minutes of face-to-face time during this this encounter and > 50% was spent counseling as documented under my assessment and plan.    Lequita Asal, MD, PhD    12/11/2018, 10:10 AM  I, Molly Dorshimer, am acting as Education administrator for [employer]. Mike Gip, MD, PhD.  I,  C. Mike Gip, MD, have reviewed the above documentation for accuracy and completeness, and I agree with the above.

## 2018-12-11 ENCOUNTER — Other Ambulatory Visit: Payer: Self-pay

## 2018-12-11 ENCOUNTER — Inpatient Hospital Stay: Payer: Medicare HMO | Attending: Hematology and Oncology | Admitting: Hematology and Oncology

## 2018-12-11 ENCOUNTER — Inpatient Hospital Stay: Payer: Medicare HMO

## 2018-12-11 ENCOUNTER — Encounter: Payer: Self-pay | Admitting: Hematology and Oncology

## 2018-12-11 VITALS — BP 135/68 | HR 79 | Temp 97.7°F | Resp 18 | Wt 123.5 lb

## 2018-12-11 DIAGNOSIS — M549 Dorsalgia, unspecified: Secondary | ICD-10-CM

## 2018-12-11 DIAGNOSIS — S32041S Stable burst fracture of fourth lumbar vertebra, sequela: Secondary | ICD-10-CM | POA: Diagnosis not present

## 2018-12-11 DIAGNOSIS — I700 Atherosclerosis of aorta: Secondary | ICD-10-CM | POA: Insufficient documentation

## 2018-12-11 DIAGNOSIS — F17210 Nicotine dependence, cigarettes, uncomplicated: Secondary | ICD-10-CM | POA: Diagnosis not present

## 2018-12-11 DIAGNOSIS — Z9012 Acquired absence of left breast and nipple: Secondary | ICD-10-CM | POA: Diagnosis not present

## 2018-12-11 DIAGNOSIS — C3411 Malignant neoplasm of upper lobe, right bronchus or lung: Secondary | ICD-10-CM

## 2018-12-11 DIAGNOSIS — I959 Hypotension, unspecified: Secondary | ICD-10-CM | POA: Diagnosis not present

## 2018-12-11 DIAGNOSIS — Z79899 Other long term (current) drug therapy: Secondary | ICD-10-CM | POA: Diagnosis not present

## 2018-12-11 DIAGNOSIS — G479 Sleep disorder, unspecified: Secondary | ICD-10-CM

## 2018-12-11 DIAGNOSIS — R5383 Other fatigue: Secondary | ICD-10-CM | POA: Diagnosis not present

## 2018-12-11 DIAGNOSIS — Z853 Personal history of malignant neoplasm of breast: Secondary | ICD-10-CM

## 2018-12-11 DIAGNOSIS — Z800 Family history of malignant neoplasm of digestive organs: Secondary | ICD-10-CM | POA: Diagnosis not present

## 2018-12-11 DIAGNOSIS — Z95828 Presence of other vascular implants and grafts: Secondary | ICD-10-CM

## 2018-12-11 DIAGNOSIS — Z8249 Family history of ischemic heart disease and other diseases of the circulatory system: Secondary | ICD-10-CM | POA: Diagnosis not present

## 2018-12-11 LAB — COMPREHENSIVE METABOLIC PANEL
ALT: 15 U/L (ref 0–44)
AST: 18 U/L (ref 15–41)
Albumin: 3.3 g/dL — ABNORMAL LOW (ref 3.5–5.0)
Alkaline Phosphatase: 92 U/L (ref 38–126)
Anion gap: 9 (ref 5–15)
BUN: 16 mg/dL (ref 8–23)
CO2: 26 mmol/L (ref 22–32)
Calcium: 8.8 mg/dL — ABNORMAL LOW (ref 8.9–10.3)
Chloride: 101 mmol/L (ref 98–111)
Creatinine, Ser: 0.69 mg/dL (ref 0.44–1.00)
GFR calc Af Amer: >60 mL/min (ref >60)
GFR calc non Af Amer: >60 mL/min (ref >60)
Glucose, Bld: 101 mg/dL — ABNORMAL HIGH (ref 70–99)
Potassium: 3.9 mmol/L (ref 3.5–5.1)
Sodium: 136 mmol/L (ref 135–145)
Total Bilirubin: 0.4 mg/dL (ref 0.3–1.2)
Total Protein: 6.5 g/dL (ref 6.5–8.1)

## 2018-12-11 LAB — CBC WITH DIFFERENTIAL/PLATELET
Abs Immature Granulocytes: 0.03 10*3/uL (ref 0.00–0.07)
Basophils Absolute: 0 10*3/uL (ref 0.0–0.1)
Basophils Relative: 1 %
Eosinophils Absolute: 0.1 10*3/uL (ref 0.0–0.5)
Eosinophils Relative: 2 %
HCT: 40.2 % (ref 36.0–46.0)
Hemoglobin: 13 g/dL (ref 12.0–15.0)
Immature Granulocytes: 1 %
Lymphocytes Relative: 28 %
Lymphs Abs: 1.8 10*3/uL (ref 0.7–4.0)
MCH: 30 pg (ref 26.0–34.0)
MCHC: 32.3 g/dL (ref 30.0–36.0)
MCV: 92.6 fL (ref 80.0–100.0)
Monocytes Absolute: 0.5 10*3/uL (ref 0.1–1.0)
Monocytes Relative: 8 %
Neutro Abs: 4 10*3/uL (ref 1.7–7.7)
Neutrophils Relative %: 60 %
Platelets: 253 10*3/uL (ref 150–400)
RBC: 4.34 MIL/uL (ref 3.87–5.11)
RDW: 13.3 % (ref 11.5–15.5)
WBC: 6.5 10*3/uL (ref 4.0–10.5)
nRBC: 0 % (ref 0.0–0.2)

## 2018-12-11 LAB — LACTATE DEHYDROGENASE: LDH: 140 U/L (ref 98–192)

## 2018-12-11 MED ORDER — SODIUM CHLORIDE 0.9% FLUSH
10.0000 mL | INTRAVENOUS | Status: AC | PRN
  Administered 2018-12-11: 10 mL via INTRAVENOUS
  Filled 2018-12-11: qty 10

## 2018-12-11 MED ORDER — HEPARIN SOD (PORK) LOCK FLUSH 100 UNIT/ML IV SOLN
500.0000 [IU] | Freq: Once | INTRAVENOUS | Status: AC
  Administered 2018-12-11: 11:00:00 500 [IU] via INTRAVENOUS

## 2018-12-11 NOTE — Progress Notes (Signed)
Pt here for follow up. Denies any concerns.  

## 2018-12-11 NOTE — Patient Instructions (Signed)
   Appointment tomorrow at 2 pm [facility] with Rachelle Hora. PA.

## 2018-12-12 ENCOUNTER — Ambulatory Visit: Payer: Medicare HMO | Admitting: Hematology and Oncology

## 2018-12-12 LAB — CEA: CEA: 2.3 ng/mL (ref 0.0–4.7)

## 2019-01-03 IMAGING — CT CT ABD-PELV W/ CM
2 of 3 series · 15 of 31 positions shown, 18 images · IV contrast (iopamidol)
Comparison: 05/30/2017.

CLINICAL DATA: Left breast cancer with chemotherapy and radiation
therapy. Lung cancer with chemotherapy and radiation therapy.

EXAM:
CT CHEST, ABDOMEN, AND PELVIS WITH CONTRAST
TECHNIQUE: Multidetector CT imaging of the chest, abdomen and pelvis was
performed following the standard protocol during bolus
administration of intravenous contrast.
CONTRAST:  100mL [id number] IOPAMIDOL ([id number]) INJECTION 61%

[Series 2: cap with · axial · 0.64mm/px · z∈[-869,-359]mm · 10 of 128 slices shown, 13 images]
[im 13/128  mediastinal]
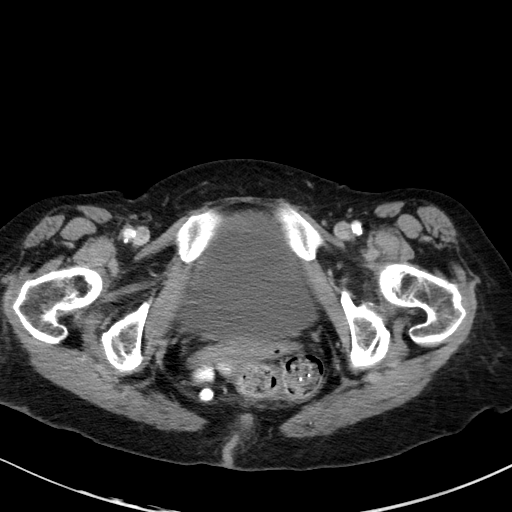
[im 13/128  lung]
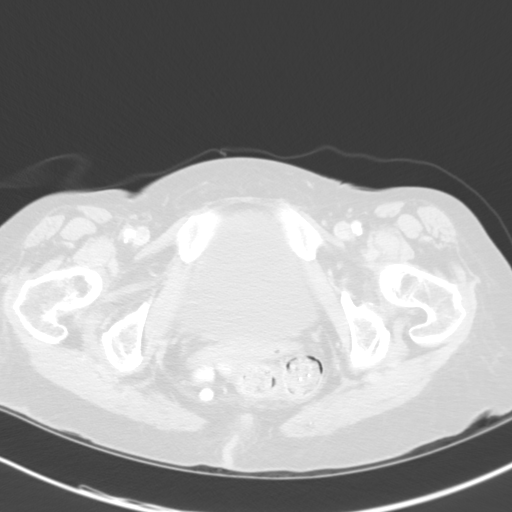
[im 26/128  lung]
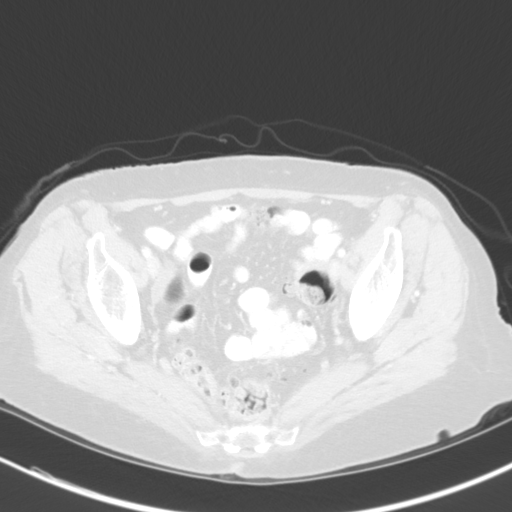
[im 39/128  lung]
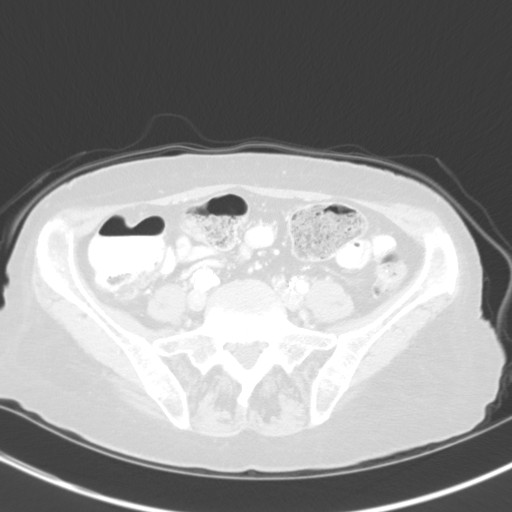
[im 51/128  lung]
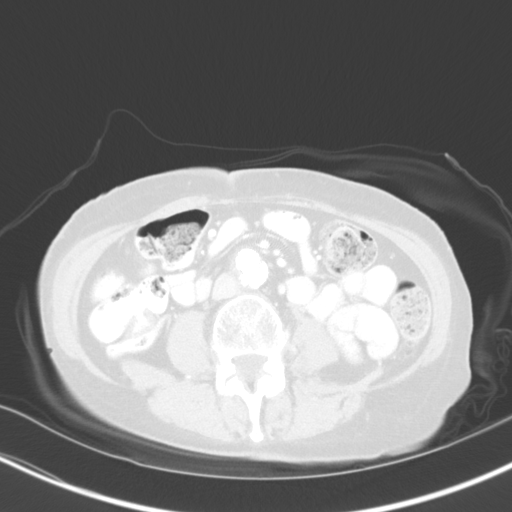
[im 63/128  mediastinal]
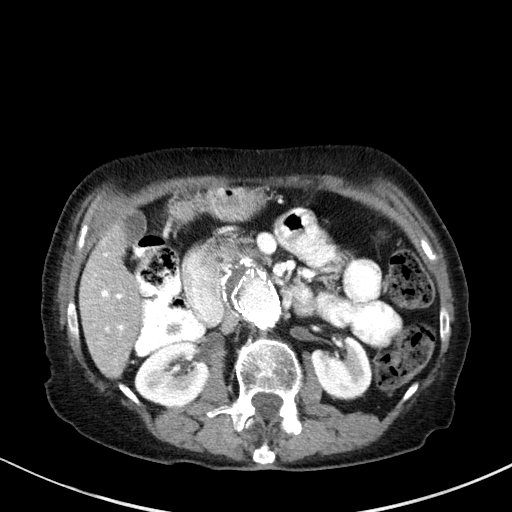
[im 63/128  lung]
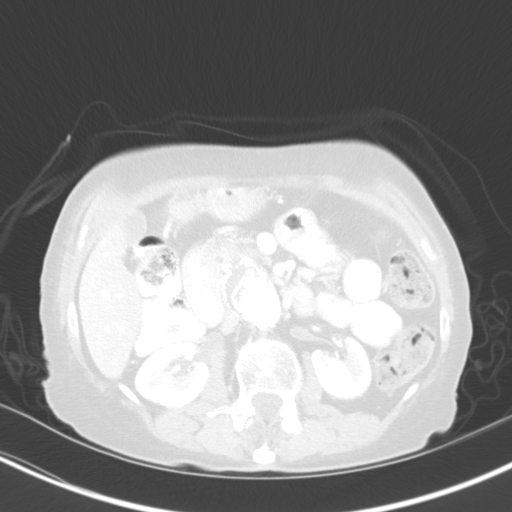
[im 64/128  lung]
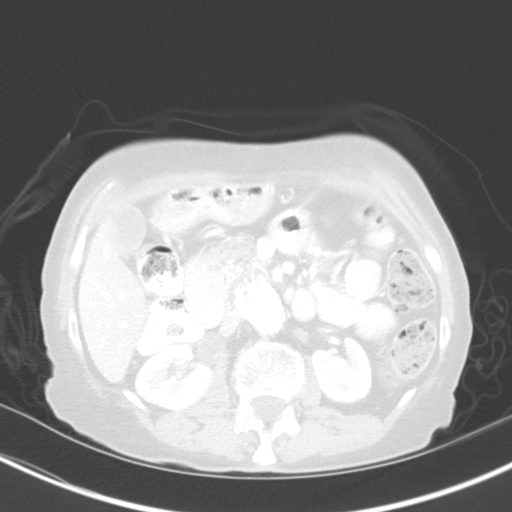
[im 77/128  lung]
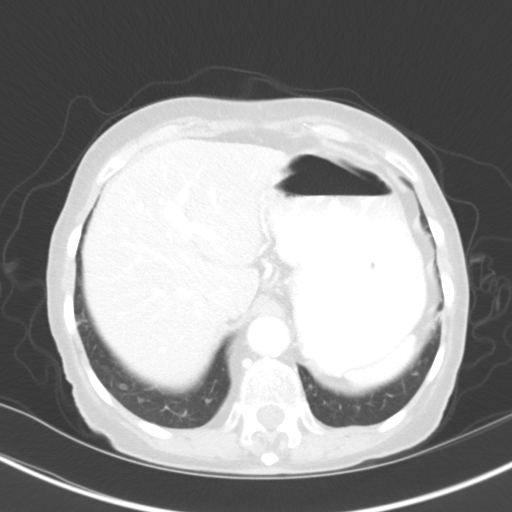
[im 89/128  lung]
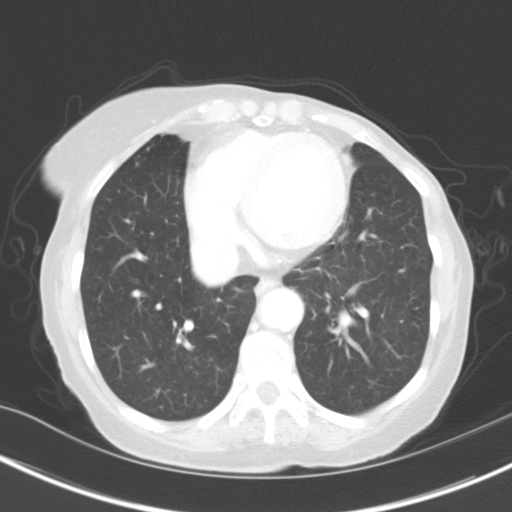
[im 102/128  mediastinal]
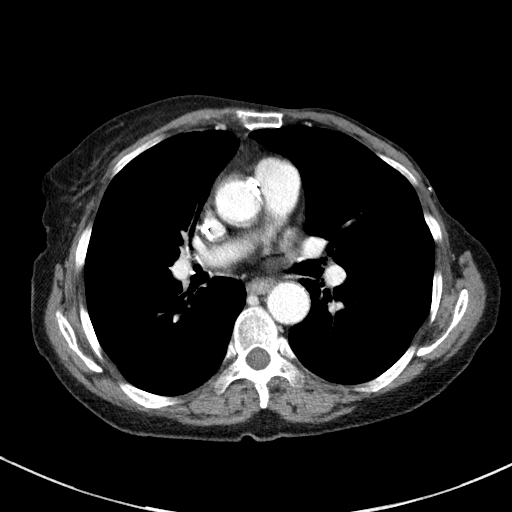
[im 102/128  lung]
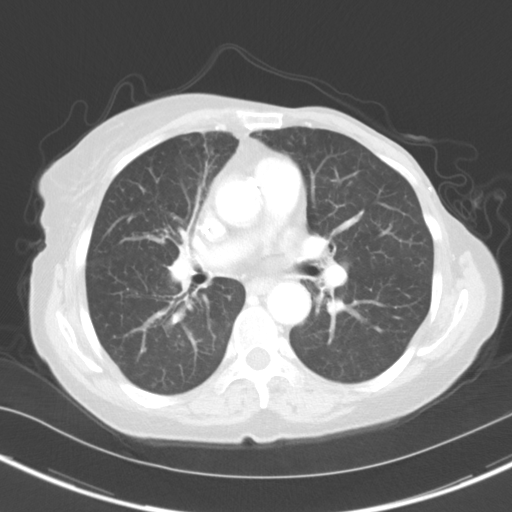
[im 115/128  lung]
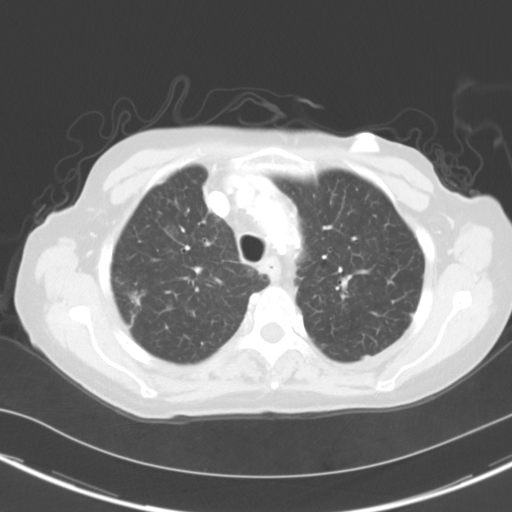

[Series 5: lung · axial · 0.64mm/px · z∈[-576,-436]mm · 5 of 153 slices shown]
[im 12/153  lung]
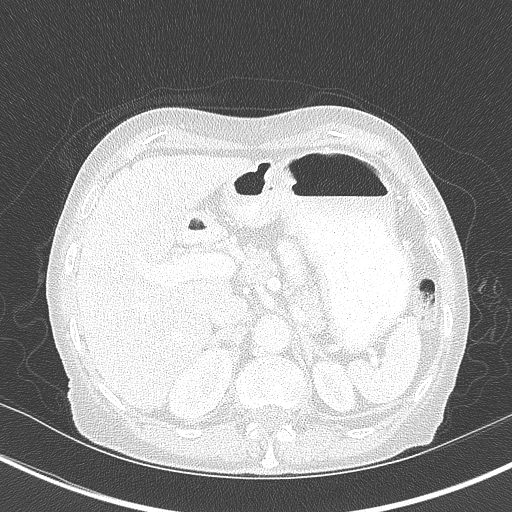
[im 36/153  lung]
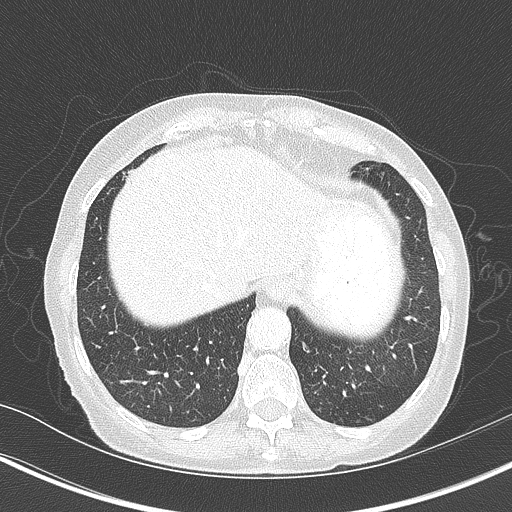
[im 59/153  lung]
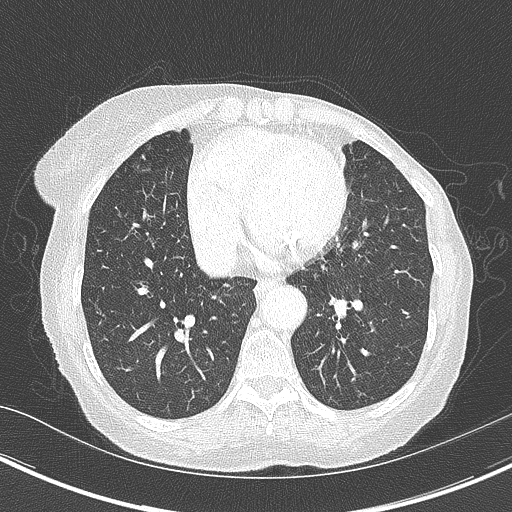
[im 71/153  lung]
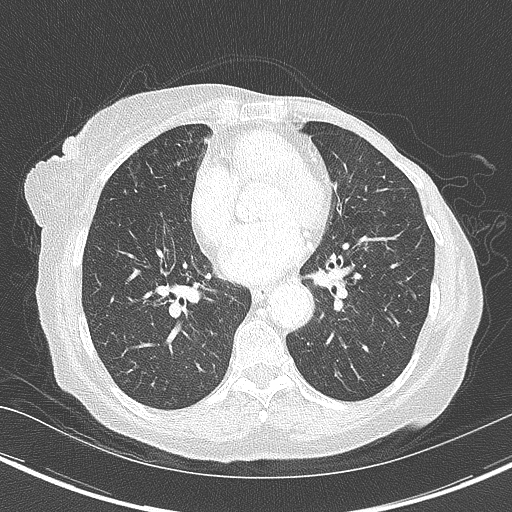
[im 82/153  lung]
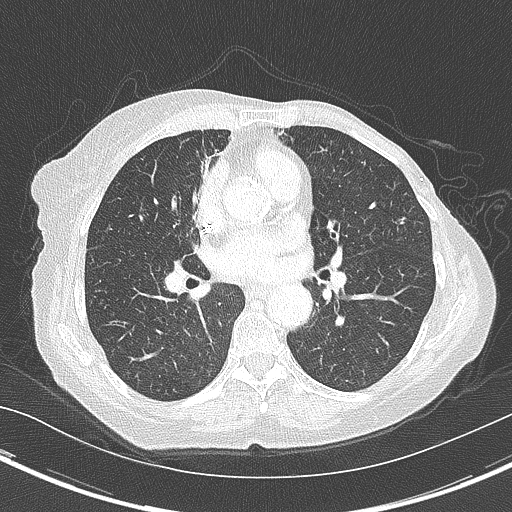

[15 of 31 positions shown; findings below may reference images not displayed]

FINDINGS: CT CHEST FINDINGS

Cardiovascular: Left IJ Port-A-Cath terminates in the high right
atrium. Atherosclerotic calcification of the arterial vasculature,
including coronary arteries and aortic valve. Heart size normal. No
pericardial effusion.

Mediastinum/Nodes: No pathologically enlarged mediastinal, hilar or
axillary lymph nodes. Surgical clips in the left axilla. Esophagus
is grossly unremarkable.

Lungs/Pleura: Biapical pleuroparenchymal scarring. Centrilobular and
paraseptal emphysema. Nodular lesion in the apical segment right
upper lobe measures 11 x 11 mm (series 5, image 32), unchanged.
Worsening peribronchovascular ground-glass and consolidation in the
right upper and right lower lobes, as well as minimally within the
medial left upper lobe. No pleural fluid. There is thickening of the
right upper lobe bronchi. Airway is otherwise unremarkable.

Musculoskeletal: No worrisome lytic or sclerotic lesions.

CT ABDOMEN PELVIS FINDINGS

Hepatobiliary: Liver and gallbladder are unremarkable. No biliary
ductal dilatation.

Pancreas: Negative.

Spleen: Negative.

Adrenals/Urinary Tract: Adrenal glands and right kidney are
unremarkable. There is geographic low attenuation within the lower
pole left kidney on nephrographic phase imaging, as before, with
associated cortical thinning. Ureters are decompressed. Bladder is
grossly unremarkable.

Stomach/Bowel: Stomach, small bowel, appendix and colon are
unremarkable.

Vascular/Lymphatic: Atherosclerotic calcification of the arterial
vasculature. Aorto bi-iliac endograft repair of an infrarenal aortic
aneurysm, measuring 3.8 cm, stable. No pathologically enlarged lymph
nodes.

Reproductive: Uterus is visualized.  No adnexal mass.

Other: No free fluid.  Mesenteries and peritoneum are unremarkable.

Musculoskeletal: No worrisome lytic or sclerotic lesions.
IMPRESSION: 1. Increasing peribronchovascular ground-glass and consolidation in
the right upper and right lower lobes, as well as medial left upper
lobe, likely due to evolving changes of radiation therapy.
2. Apical segment right upper lobe nodular consolidation, stable.
3. Aortic atherosclerosis (2VHXJ-170.0). Coronary artery
calcification.
4.  Emphysema (2VHXJ-GTV.J).
5. Aorto bi-iliac endograft repair of an infrarenal aortic aneurysm,
stable.

## 2019-01-15 ENCOUNTER — Inpatient Hospital Stay: Payer: Medicare HMO

## 2019-02-04 NOTE — Progress Notes (Signed)
[facility]  [address], [address] [place], [place] [address] Phone: [phone]  Fax: [phone]   Clinic Day:  02/05/2019  Referring physician: Gayland Curry, MD  Chief Complaint: Roberta [name] is a 73 y.o. female with limited stage small cell lung cancer who is seen for60-monthassessment.  HPI: The patient was last seen in the medical oncology clinic on 12/11/2018. At that time, she felt "fairly well".  She continued to have some back pain.   She was seen by Rachelle Hora, PA in the [facility] on 12/12/2018.  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  She was started on a 10 day steroid taper.  Voltaren gel and/or salon pause was recommended as needed as well as a heating pad.  During the interim, she is felt "okay".  Her appetite is great.  She is drinking Ensure twice a day.  Weight is up 6 pounds.  She denies any increased shortness of breath or cough.  She is smoking half a pack a day.   Past Medical History:  Diagnosis Date  . AAA (abdominal aortic aneurysm) ([place])   . Anemia   . Breast cancer ([place]) 2002   left  . COPD (chronic obstructive pulmonary disease) ([place])   . Dyspnea   . Headache   . High cholesterol   . Personal history of chemotherapy   . Small cell lung cancer ([place])   . Tobacco abuse     Past Surgical History:  Procedure Laterality Date  . ABDOMINAL HYSTERECTOMY    . ARTERY BIOPSY Right 12/05/2017   Procedure: BIOPSY TEMPORAL ARTERY;  Surgeon: Margaretha Sheffield, MD;  Location: [facility] ORS;  Service: ENT;  Laterality: Right;  . ENDOBRONCHIAL ULTRASOUND N/A 02/19/2017   Procedure: ENDOBRONCHIAL ULTRASOUND;  Surgeon: Flora Lipps, MD;  Location: [facility] ORS;  Service: Cardiopulmonary;  Laterality: N/A;  . ENDOVASCULAR REPAIR/STENT GRAFT N/A 12/06/2016   Procedure: Endovascular Repair/Stent Graft;  Surgeon: Katha Cabal, MD;  Location: [facility];  Service: Cardiovascular;  Laterality: N/A;  . IR FLUORO GUIDE  PORT INSERTION RIGHT  03/02/2017  . KYPHOPLASTY N/A 06/11/2018   Procedure: KYPHOPLASTY L4 BIOPSY WITH RFA;  Surgeon: Hessie Knows, MD;  Location: [facility] ORS;  Service: Orthopedics;  Laterality: N/A;  . MASTECTOMY Left 2002   with sentinel node     Family History  Problem Relation Age of Onset  . CAD Mother   . Colon cancer Brother   . Breast cancer Neg Hx     Social History:  reports that she has been smoking cigarettes. She has a 25.00 pack-year smoking history. She has never used smokeless tobacco. She reports that she does not drink alcohol or use drugs. 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 The patient's sister's name is Roberta [name].  She is alone today.  Allergies: No Known Allergies  Current Medications: Current Outpatient Medications  Medication Sig Dispense Refill  . acetaminophen (TYLENOL) 325 MG tablet Take 650 mg by mouth every 6 (six) hours as needed for mild pain.     Marland Kitchen albuterol (2.5 MG/3ML) 0.083% NEBU 3 mL, albuterol (5 MG/ML) 0.5% NEBU 0.5 mL Inhale 1 mg into the lungs.    Marland Kitchen albuterol (PROVENTIL HFA;VENTOLIN HFA) 108 (90 Base) MCG/ACT inhaler Inhale 2 puffs into the lungs every 6 (six) hours as needed for wheezing  or shortness of breath. 1 Inhaler 1  . aspirin EC 81 MG tablet Take 81 mg by mouth daily.    Marland Kitchen atorvastatin (LIPITOR) 10 MG tablet Take 10 mg by mouth at bedtime.     . fluticasone-salmeterol (ADVAIR HFA) 115-21 MCG/ACT inhaler Inhale 2 puffs into the lungs 2 (two) times daily.    Marland Kitchen loratadine (CLARITIN) 10 MG tablet Take 1 tablet (10 mg total) by mouth daily. 30 tablet 1  . Multiple Vitamin (MULTIVITAMIN) tablet Take 1 tablet by mouth daily.    . megestrol (MEGACE) 40 MG/ML suspension Take 5 mLs (200 mg total) by mouth daily. (Patient not taking: Reported on 02/05/2019) 480 mL 0   No current facility-administered medications  for this visit.    Facility-Administered Medications Ordered in Other Visits  Medication Dose Route Frequency Provider Last Rate Last Dose  . sodium chloride flush (NS) 0.9 % injection 10 mL  10 mL Intravenous PRN Sindy Guadeloupe, MD   10 mL at 03/26/17 0948  . sodium chloride flush (NS) 0.9 % injection 10 mL  10 mL Intravenous PRN Sindy Guadeloupe, MD   10 mL at 04/16/17 1006  . sodium chloride flush (NS) 0.9 % injection 10 mL  10 mL Intravenous PRN Sindy Guadeloupe, MD   10 mL at 01/10/18 1410  . sodium chloride flush (NS) 0.9 % injection 10 mL  10 mL Intravenous PRN Lequita Asal, MD   10 mL at 12/11/18 1033    Review of Systems  Constitutional: Positive for malaise/fatigue (improving). Negative for chills, diaphoresis, fever and weight loss (up 6 lbs).       Feels "ok".  HENT: Negative.  Negative for congestion, ear pain, hearing loss, nosebleeds, sinus pain and sore throat.   Eyes: Negative.  Negative for blurred vision, double vision and photophobia.  Respiratory: Positive for cough (chronic) and shortness of breath (exertional). Negative for hemoptysis and sputum production.        COPD.  Smokes half a pack a day.  Cardiovascular: Negative.  Negative for chest pain, palpitations, orthopnea, leg swelling and PND.  Gastrointestinal: Negative.  Negative for abdominal pain, blood in stool, constipation, diarrhea, melena, nausea and vomiting.  Genitourinary: Negative.  Negative for dysuria, frequency, hematuria and urgency.  Musculoskeletal: Positive for back pain (s/p kyphoplasty; see HPI). Negative for falls, joint pain and myalgias.  Skin: Negative.  Negative for itching and rash.  Neurological: Negative.  Negative for dizziness, tingling, sensory change, speech change, focal weakness, weakness and headaches.  Endo/Heme/Allergies: Positive for environmental allergies (seasonal). Does not bruise/bleed easily.  Psychiatric/Behavioral: Negative.  Negative for depression, memory loss and  substance abuse. The patient is not nervous/anxious and does not have insomnia.   All other systems reviewed and are negative.  Performance status (ECOG): 1-2  Vitals Blood pressure 132/77, pulse 80, temperature 98.1 F (36.7 C), temperature source Tympanic, resp. rate 17, weight 129 lb 15.4 oz (59 kg), SpO2 98 %.   Physical Exam  Constitutional: She is oriented to person, place, and time. She appears well-developed and well-nourished. No distress.  HENT:  Head: Normocephalic and atraumatic.  Mouth/Throat: Oropharynx is clear and moist. No oropharyngeal exudate.  Short brown hair. Mask.  Eyes: Pupils are equal, round, and reactive to light. Conjunctivae and EOM are normal. No scleral icterus.  Neck: Normal range of motion. Neck supple. No JVD present.  Cardiovascular: Normal rate, regular rhythm and normal heart sounds. Exam reveals no gallop.  No murmur heard. Pulmonary/Chest: Effort  normal and breath sounds normal. No respiratory distress. She has no wheezes. She has no rales.  Abdominal: Soft. Bowel sounds are normal. She exhibits no distension and no mass. There is no abdominal tenderness. There is no rebound and no guarding.  Musculoskeletal: Normal range of motion.        General: No edema.  Lymphadenopathy:    She has no cervical adenopathy.    She has no axillary adenopathy.       Right: No supraclavicular adenopathy present.       Left: No supraclavicular adenopathy present.  Neurological: She is alert and oriented to person, place, and time.  Skin: Skin is warm and dry. No rash noted. She is not diaphoretic. No erythema. No pallor.  Psychiatric: She has a normal mood and affect. Her behavior is normal. Judgment and thought content normal.  Nursing note and vitals reviewed.   Infusion on 02/05/2019  Component Date Value Ref Range Status  . Sodium 02/05/2019 137  135 - 145 mmol/L Final  . Potassium 02/05/2019 4.2  3.5 - 5.1 mmol/L Final  . Chloride 02/05/2019 102  98 -  111 mmol/L Final  . CO2 02/05/2019 25  22 - 32 mmol/L Final  . Glucose, Bld 02/05/2019 97  70 - 99 mg/dL Final  . BUN 02/05/2019 16  8 - 23 mg/dL Final  . Creatinine, Ser 02/05/2019 0.88  0.44 - 1.00 mg/dL Final  . Calcium 02/05/2019 8.7* 8.9 - 10.3 mg/dL Final  . Total Protein 02/05/2019 6.4* 6.5 - 8.1 g/dL Final  . Albumin 02/05/2019 3.5  3.5 - 5.0 g/dL Final  . AST 02/05/2019 17  15 - 41 U/L Final  . ALT 02/05/2019 11  0 - 44 U/L Final  . Alkaline Phosphatase 02/05/2019 81  38 - 126 U/L Final  . Total Bilirubin 02/05/2019 0.4  0.3 - 1.2 mg/dL Final  . GFR calc non Af Amer 02/05/2019 >60  >60 mL/min Final  . GFR calc Af Amer 02/05/2019 >60  >60 mL/min Final  . Anion gap 02/05/2019 10  5 - 15 Final   Performed at [facility], [address] [place], [place] [address]    Assessment:  Roberta [name] is a 73 y.o. female with limited stage small cell lung cancer. She had stage IIIB (T3N2M0) disease. She received cisplatin and etoposidefrom 03/05/2017 - 05/14/2017. She received radiationfrom 03/08/2017 - 04/23/2017.  Chest, abdomen, and pelvic CTon 05/30/2017 revealed continued improvement in the right upper lobe mass, right hilar and mediastinal lymphadenopathy consistent with response to therapy. there was interval development of multiple small ill-defined peribronchovascular nodules in both lungs, greatest in the right upper lobe. Some of these were clustered, and were not associated with the bronchovascular bundles such that lymphangitic tumor was unlikely. These were likely inflammatory. There was no evidence of metastatic disease in the abdomen or pelvis  She received PCIfrom 06/19/2017 - 07/11/2017.  Chest, abdomen, and pelvic CTon 06/07/2018 revealed interval development of L4 vertebral body burst fracturewith mild retropulsion of the posterior aspect of the vertebral body. There was associated sclerosis of the L4 vertebral body with significant height  loss predominately involving the anterior and mid aspect of the vertebral body. Possibility of pathologic fracture was not excluded. There was a nondisplacedright L4 transverse process fracture. Recommend further evaluation with lumbar spine MRI. There were findings suggestive of mild acute sigmoid colonic diverticulitis. There was similar to mild interval increase in size of previously described lesion within the right upper  lobe (3.1 x 1.8 cm to 3.2 x 2.2 cm). Adjacent irregular nodulealong the inferior margin within the right upper lobe was grossly similar when compared to prior (1.9 x 1.1 cm to 1.2 x 2.0 cm). Additional areas of consolidation within the right lung were grossly similar. There was interval improvement in previously described tree-in-bud nodularity within the left lower lobe with slight interval increase in ground-glass nodularity within the right lower lobe suggestive of atypical infectious process (MAI).  PETscanon03/16/2020 revealed a 3.1 x 3.2 focus area of opacity in the RIGHTupper lobe (SUV 2.0). Additionally, there was a 1.9 x 2.2 cm perihilar RIGHTlung lesion extending into the superior aspect of the RIGHThilum (SUV 1.8). Both areas considered to be infectious/inflammatory, however low-grade neoplasm cannot be excluded. Short-term follow-up recommended.  ChestCTon 11/12/2018 revealed expected evolution of post radiation pneumonitis/fibrosis in the right suprahilar region.There was no evidence of local recurrence or metastatic disease.There was no adenopathy. There was aorticatherosclerosis.  She underwent kyphoplasty of L4with radiofrequency ablation on 06/11/2018. Pathologyrevealed viable bone and trilineage metabolic marrow with reactive changes and focal changes of fracture callus. IHC stains for pancytokeratin were negative. There was no evidence of metastatic disease.  She has a history of left breast cancerin 2002. She underwent left mastectomy  followed by adjuvant chemotherapy. She was treated by Dr. Oliva Bustard. She did not receive adjuvant radiation.  Symptomatically, she is doing "okay".  Appetite is great.  She is gained 6 pounds.  She denies any respiratory symptoms.  She continues to smoke.  Plan: 1.   Labs today:CBC with diff, CMP. 2.Limited stage SCLC Clinically, she is doing well.  Discussed plans for follow-up CT scans in early December. Encourage smoking cessation. 3.L4 burst fracture s/p kyphoplasty Patient followed up with orthopedics. 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 4.   Port flush every 6-8 weeks (done today). 5.   Schedule chest, abdomen, pelvic CT on 05/05/2019. 6.   RTC after CT scans for MD assessment, labs (CBC with diff, CMP- can be done before CT), and review of CT scans.  I discussed the assessment and treatment plan with the patient.  The patient was provided an opportunity to ask questions and all were answered.  The patient agreed with the plan and demonstrated an understanding of the instructions.  The patient was advised to call back if the symptoms worsen or if the condition fails to improve as anticipated.   Lequita Asal, MD, PhD    02/05/2019, 10:31 AM

## 2019-02-05 ENCOUNTER — Encounter: Payer: Self-pay | Admitting: Hematology and Oncology

## 2019-02-05 ENCOUNTER — Inpatient Hospital Stay: Payer: Medicare HMO | Attending: Hematology and Oncology

## 2019-02-05 ENCOUNTER — Other Ambulatory Visit: Payer: Self-pay

## 2019-02-05 ENCOUNTER — Inpatient Hospital Stay: Payer: Medicare HMO | Attending: Hematology and Oncology | Admitting: Hematology and Oncology

## 2019-02-05 VITALS — BP 132/77 | HR 80 | Temp 98.1°F | Resp 17 | Wt 130.0 lb

## 2019-02-05 DIAGNOSIS — C3411 Malignant neoplasm of upper lobe, right bronchus or lung: Secondary | ICD-10-CM | POA: Insufficient documentation

## 2019-02-05 DIAGNOSIS — Z7982 Long term (current) use of aspirin: Secondary | ICD-10-CM | POA: Insufficient documentation

## 2019-02-05 DIAGNOSIS — Z853 Personal history of malignant neoplasm of breast: Secondary | ICD-10-CM

## 2019-02-05 DIAGNOSIS — E785 Hyperlipidemia, unspecified: Secondary | ICD-10-CM | POA: Diagnosis not present

## 2019-02-05 DIAGNOSIS — Z452 Encounter for adjustment and management of vascular access device: Secondary | ICD-10-CM | POA: Diagnosis present

## 2019-02-05 DIAGNOSIS — Z923 Personal history of irradiation: Secondary | ICD-10-CM | POA: Diagnosis not present

## 2019-02-05 DIAGNOSIS — Z8249 Family history of ischemic heart disease and other diseases of the circulatory system: Secondary | ICD-10-CM | POA: Diagnosis not present

## 2019-02-05 DIAGNOSIS — F17210 Nicotine dependence, cigarettes, uncomplicated: Secondary | ICD-10-CM | POA: Insufficient documentation

## 2019-02-05 DIAGNOSIS — S32041S Stable burst fracture of fourth lumbar vertebra, sequela: Secondary | ICD-10-CM | POA: Diagnosis not present

## 2019-02-05 DIAGNOSIS — Z9221 Personal history of antineoplastic chemotherapy: Secondary | ICD-10-CM | POA: Insufficient documentation

## 2019-02-05 DIAGNOSIS — J449 Chronic obstructive pulmonary disease, unspecified: Secondary | ICD-10-CM | POA: Diagnosis not present

## 2019-02-05 DIAGNOSIS — M549 Dorsalgia, unspecified: Secondary | ICD-10-CM | POA: Insufficient documentation

## 2019-02-05 DIAGNOSIS — R5383 Other fatigue: Secondary | ICD-10-CM | POA: Diagnosis not present

## 2019-02-05 DIAGNOSIS — Z7951 Long term (current) use of inhaled steroids: Secondary | ICD-10-CM | POA: Diagnosis not present

## 2019-02-05 DIAGNOSIS — E7800 Pure hypercholesterolemia, unspecified: Secondary | ICD-10-CM | POA: Diagnosis not present

## 2019-02-05 DIAGNOSIS — Z79899 Other long term (current) drug therapy: Secondary | ICD-10-CM | POA: Diagnosis not present

## 2019-02-05 LAB — COMPREHENSIVE METABOLIC PANEL
ALT: 11 U/L (ref 0–44)
AST: 17 U/L (ref 15–41)
Albumin: 3.5 g/dL (ref 3.5–5.0)
Alkaline Phosphatase: 81 U/L (ref 38–126)
Anion gap: 10 (ref 5–15)
BUN: 16 mg/dL (ref 8–23)
CO2: 25 mmol/L (ref 22–32)
Calcium: 8.7 mg/dL — ABNORMAL LOW (ref 8.9–10.3)
Chloride: 102 mmol/L (ref 98–111)
Creatinine, Ser: 0.88 mg/dL (ref 0.44–1.00)
GFR calc Af Amer: >60 mL/min (ref >60)
GFR calc non Af Amer: >60 mL/min (ref >60)
Glucose, Bld: 97 mg/dL (ref 70–99)
Potassium: 4.2 mmol/L (ref 3.5–5.1)
Sodium: 137 mmol/L (ref 135–145)
Total Bilirubin: 0.4 mg/dL (ref 0.3–1.2)
Total Protein: 6.4 g/dL — ABNORMAL LOW (ref 6.5–8.1)

## 2019-02-05 LAB — CBC WITH DIFFERENTIAL/PLATELET
Abs Immature Granulocytes: 0.05 10*3/uL (ref 0.00–0.07)
Basophils Absolute: 0 10*3/uL (ref 0.0–0.1)
Basophils Relative: 0 %
Eosinophils Absolute: 0.1 10*3/uL (ref 0.0–0.5)
Eosinophils Relative: 2 %
HCT: 40.6 % (ref 36.0–46.0)
Hemoglobin: 13.2 g/dL (ref 12.0–15.0)
Immature Granulocytes: 1 %
Lymphocytes Relative: 22 %
Lymphs Abs: 1.5 10*3/uL (ref 0.7–4.0)
MCH: 29.7 pg (ref 26.0–34.0)
MCHC: 32.5 g/dL (ref 30.0–36.0)
MCV: 91.2 fL (ref 80.0–100.0)
Monocytes Absolute: 0.6 10*3/uL (ref 0.1–1.0)
Monocytes Relative: 8 %
Neutro Abs: 4.7 10*3/uL (ref 1.7–7.7)
Neutrophils Relative %: 67 %
Platelets: 241 10*3/uL (ref 150–400)
RBC: 4.45 MIL/uL (ref 3.87–5.11)
RDW: 14 % (ref 11.5–15.5)
WBC: 6.9 10*3/uL (ref 4.0–10.5)
nRBC: 0 % (ref 0.0–0.2)

## 2019-02-05 MED ORDER — SODIUM CHLORIDE 0.9% FLUSH
10.0000 mL | INTRAVENOUS | Status: DC | PRN
  Administered 2019-02-05: 10 mL via INTRAVENOUS
  Filled 2019-02-05: qty 10

## 2019-02-05 MED ORDER — HEPARIN SOD (PORK) LOCK FLUSH 100 UNIT/ML IV SOLN
500.0000 [IU] | Freq: Once | INTRAVENOUS | Status: AC
  Administered 2019-02-05: 500 [IU] via INTRAVENOUS

## 2019-02-05 NOTE — Progress Notes (Signed)
Pt here for follow up. Denies any concerns.  

## 2019-02-26 ENCOUNTER — Inpatient Hospital Stay: Payer: Medicare HMO

## 2019-03-18 ENCOUNTER — Other Ambulatory Visit: Payer: Self-pay

## 2019-03-19 ENCOUNTER — Other Ambulatory Visit: Payer: Self-pay

## 2019-03-19 ENCOUNTER — Inpatient Hospital Stay: Payer: Medicare HMO | Attending: Hematology and Oncology

## 2019-03-19 DIAGNOSIS — Z95828 Presence of other vascular implants and grafts: Secondary | ICD-10-CM

## 2019-03-19 DIAGNOSIS — Z452 Encounter for adjustment and management of vascular access device: Secondary | ICD-10-CM | POA: Diagnosis present

## 2019-03-19 DIAGNOSIS — C3411 Malignant neoplasm of upper lobe, right bronchus or lung: Secondary | ICD-10-CM | POA: Insufficient documentation

## 2019-03-19 MED ORDER — SODIUM CHLORIDE 0.9% FLUSH
10.0000 mL | INTRAVENOUS | Status: DC | PRN
  Administered 2019-03-19: 09:00:00 10 mL via INTRAVENOUS
  Filled 2019-03-19: qty 10

## 2019-03-19 MED ORDER — HEPARIN SOD (PORK) LOCK FLUSH 100 UNIT/ML IV SOLN
500.0000 [IU] | Freq: Once | INTRAVENOUS | Status: AC
  Administered 2019-03-19: 09:00:00 500 [IU] via INTRAVENOUS
  Filled 2019-03-19: qty 5

## 2019-03-31 ENCOUNTER — Other Ambulatory Visit: Payer: Self-pay | Admitting: *Deleted

## 2019-04-04 MED ORDER — LORATADINE 10 MG PO TABS: 10.0000 mg | ORAL_TABLET | Freq: Every day | ORAL | 1 refills | Status: DC

## 2019-05-04 NOTE — Progress Notes (Signed)
[facility]  [address] [place], [place] [address] Phone: [phone]  Fax: [phone]   Telemedicine Office Visit:  05/06/2019  Referring physician: Gayland Curry, MD  I connected with Roberta [name] on 05/06/2019 at 9:34 AM by videoconferencing and verified that I was speaking with the correct person using 2 identifiers.  The patient was at home.  I discussed the limitations, risk, security and privacy concerns of performing an evaluation and management service by videoconferencing and the availability of in person appointments.  I also discussed with the patient that there may be a patient responsible charge related to this service.  The patient expressed understanding and agreed to proceed.   Chief Complaint: Roberta [name] is a 73 y.o. female with limited stage small cell lung cancer who is seen fora 3 monthassessment and review of interval CT scans.   HPI: The patient was last seen in the medical oncology clinic on 02/05/2019. At that time, she was doing "okay".  Appetite was great.  She gained 6 pounds.  She denied any respiratory symptoms.  She continued to smoke. CBC was normal. Calcium 8.7. Total protein was 6.4. We discussed plans for a follow up chest, abdomen, and pelvis CT in early 04/2019.   Chest, abdomen, and pelvis CT on 05/05/2019 revealed evolving postradiation mass-like fibrosis in the upper right lung, similar to the prior study. There was slight enlargement of small right pleural effusion. There were no definitive findings to suggest locally recurrent disease or metastatic disease in the chest, abdomen or pelvis. There was a new compression fracture of superior endplate of T5 with 98% loss of anterior vertebral body height, favored to be a benign compression fracture. There was severe colonic diverticulosis without evidence of acute diverticulitis, aortic atherosclerosis, in addition to left main and 3 vessel coronary artery  disease. There were calcifications of the aortic valve and mitral annulus. There was diffuse bronchial wall thickening with very mild centrilobular and mild paraseptal emphysema; imaging findings suggestive of underlying COPD.  During the interim, she has felt "ok".  Her weight is stable. She is eating well. She has lower back pain and notes prednisone gave mild relief.  Her back hurts if she stands for a long time.  She reports a good energy level. She is smoking a half a pack a day. Her cough and shortness of breath are improving. She notes using her inhaler every once in a while.   Sodium is 133 and I advised her to drink fluids with more electrolytes.  She has a port-a-cath in place.    Past Medical History:  Diagnosis Date   AAA (abdominal aortic aneurysm) ([place])    Anemia    Breast cancer ([place]) 2002   left   COPD (chronic obstructive pulmonary disease) ([facility])    Dyspnea    Headache    High cholesterol    Personal history of chemotherapy    Small cell lung cancer ([place])    Tobacco abuse     Past Surgical History:  Procedure Laterality Date   ABDOMINAL HYSTERECTOMY     ARTERY BIOPSY Right 12/05/2017   Procedure: BIOPSY TEMPORAL ARTERY;  Surgeon: Margaretha Sheffield, MD;  Location: [facility] ORS;  Service: ENT;  Laterality: Right;   ENDOBRONCHIAL ULTRASOUND N/A 02/19/2017   Procedure: ENDOBRONCHIAL ULTRASOUND;  Surgeon: Flora Lipps, MD;  Location: [facility] ORS;  Service: Cardiopulmonary;  Laterality: N/A;   ENDOVASCULAR REPAIR/STENT GRAFT N/A 12/06/2016   Procedure: Endovascular Repair/Stent Graft;  Surgeon: Delana Meyer,  Dolores Lory, MD;  Location: [facility];  Service: Cardiovascular;  Laterality: N/A;   IR FLUORO GUIDE PORT INSERTION RIGHT  03/02/2017   KYPHOPLASTY N/A 06/11/2018   Procedure: KYPHOPLASTY L4 BIOPSY WITH RFA;  Surgeon: Hessie Knows, MD;  Location: [facility] ORS;  Service: Orthopedics;  Laterality: N/A;   MASTECTOMY Left 2002   with sentinel node     Family History   Problem Relation Age of Onset   CAD Mother    Colon cancer Brother    Breast cancer Neg Hx     Social History:  reports that she has been smoking cigarettes. She has a 25.00 pack-year smoking history. She has never used smokeless tobacco. She reports that she does not drink alcohol or use drugs. 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 The patient's sister's name is [name].  The patient is alone today.  Participants in the patient's visit and their role in the encounter included the patient and Waymon Budge, RN today.  The intake visit was provided by Waymon Budge, RN.  Allergies: No Known Allergies  Current Medications: Current Outpatient Medications  Medication Sig Dispense Refill   acetaminophen (TYLENOL) 325 MG tablet Take 650 mg by mouth every 6 (six) hours as needed for mild pain.      albuterol (2.5 MG/3ML) 0.083% NEBU 3 mL, albuterol (5 MG/ML) 0.5% NEBU 0.5 mL Inhale 1 mg into the lungs.     albuterol (PROVENTIL HFA;VENTOLIN HFA) 108 (90 Base) MCG/ACT inhaler Inhale 2 puffs into the lungs every 6 (six) hours as needed for wheezing or shortness of breath. 1 Inhaler 1   aspirin EC 81 MG tablet Take 81 mg by mouth daily.     atorvastatin (LIPITOR) 10 MG tablet Take 10 mg by mouth at bedtime.      fluticasone-salmeterol (ADVAIR HFA) 115-21 MCG/ACT inhaler Inhale 2 puffs into the lungs 2 (two) times daily.     loratadine (CLARITIN) 10 MG tablet Take 1 tablet (10 mg total) by mouth daily. 30 tablet 1   Multiple Vitamin (MULTIVITAMIN) tablet Take 1 tablet by mouth daily.     No current facility-administered medications for this visit.    Facility-Administered Medications Ordered in Other Visits  Medication Dose Route Frequency Provider Last Rate Last Dose   sodium chloride flush (NS) 0.9 % injection 10 mL  10 mL  Intravenous PRN Sindy Guadeloupe, MD   10 mL at 03/26/17 0948   sodium chloride flush (NS) 0.9 % injection 10 mL  10 mL Intravenous PRN Sindy Guadeloupe, MD   10 mL at 04/16/17 1006   sodium chloride flush (NS) 0.9 % injection 10 mL  10 mL Intravenous PRN Sindy Guadeloupe, MD   10 mL at 01/10/18 1410   sodium chloride flush (NS) 0.9 % injection 10 mL  10 mL Intravenous PRN Lequita Asal, MD   10 mL at 12/11/18 1033     Review of Systems  Constitutional: Negative for chills, diaphoresis, fever, malaise/fatigue (improving) and weight loss (stable).       Doing "ok".  Good energy.  HENT: Negative.  Negative for congestion, ear pain, hearing loss, nosebleeds, sinus pain and sore throat.   Eyes: Negative.  Negative for blurred vision, double vision and photophobia.  Respiratory: Negative for cough (chronic, improving), hemoptysis, sputum production and shortness  of breath (exertional, improving).        COPD.  Smokes half a pack a day.  Cardiovascular: Negative.  Negative for chest pain, palpitations, orthopnea, leg swelling and PND.  Gastrointestinal: Negative.  Negative for abdominal pain, blood in stool, constipation, diarrhea, melena, nausea and vomiting.       Eating well.  Genitourinary: Negative.  Negative for dysuria, frequency, hematuria and urgency.  Musculoskeletal: Positive for back pain (lower back; s/p kyphoplasty). Negative for falls, joint pain and myalgias.  Skin: Negative.  Negative for itching and rash.  Neurological: Negative.  Negative for dizziness, tingling, sensory change, speech change, focal weakness, weakness and headaches.  Endo/Heme/Allergies: Negative.  Negative for environmental allergies. Does not bruise/bleed easily.  Psychiatric/Behavioral: Negative.  Negative for depression, memory loss and substance abuse. The patient is not nervous/anxious and does not have insomnia.   All other systems reviewed and are negative.   Performance status (ECOG): 1-2  Physical  Exam  Constitutional: She is oriented to person, place, and time. She appears well-developed and well-nourished. No distress.  HENT:  Head: Normocephalic and atraumatic.  Short dark hair.  Eyes: Conjunctivae and EOM are normal. No scleral icterus.  Neurological: She is alert and oriented to person, place, and time.  Skin: She is not diaphoretic.  Psychiatric: She has a normal mood and affect. Her behavior is normal. Judgment and thought content normal.  Nursing note reviewed.   Infusion on 05/05/2019  Component Date Value Ref Range Status   Sodium 05/05/2019 133* 135 - 145 mmol/L Final   Potassium 05/05/2019 4.2  3.5 - 5.1 mmol/L Final   Chloride 05/05/2019 99  98 - 111 mmol/L Final   CO2 05/05/2019 26  22 - 32 mmol/L Final   Glucose, Bld 05/05/2019 98  70 - 99 mg/dL Final   BUN 05/05/2019 19  8 - 23 mg/dL Final   Creatinine, Ser 05/05/2019 0.90  0.44 - 1.00 mg/dL Final   Calcium 05/05/2019 8.9  8.9 - 10.3 mg/dL Final   Total Protein 05/05/2019 6.7  6.5 - 8.1 g/dL Final   Albumin 05/05/2019 3.5  3.5 - 5.0 g/dL Final   AST 05/05/2019 17  15 - 41 U/L Final   ALT 05/05/2019 11  0 - 44 U/L Final   Alkaline Phosphatase 05/05/2019 92  38 - 126 U/L Final   Total Bilirubin 05/05/2019 0.4  0.3 - 1.2 mg/dL Final   GFR calc non Af Amer 05/05/2019 >60  >60 mL/min Final   GFR calc Af Amer 05/05/2019 >60  >60 mL/min Final   Anion gap 05/05/2019 8  5 - 15 Final   Performed at [facility], [address] [place], [place] [address]   WBC 05/05/2019 6.7  4.0 - 10.5 K/uL Final   RBC 05/05/2019 4.20  3.87 - 5.11 MIL/uL Final   Hemoglobin 05/05/2019 12.5  12.0 - 15.0 g/dL Final   HCT 05/05/2019 38.1  36.0 - 46.0 % Final   MCV 05/05/2019 90.7  80.0 - 100.0 fL Final   MCH 05/05/2019 29.8  26.0 - 34.0 pg Final   MCHC 05/05/2019 32.8  30.0 - 36.0 g/dL Final   RDW 05/05/2019 13.7  11.5 - 15.5 % Final   Platelets 05/05/2019 227  150 - 400 K/uL Final   nRBC  05/05/2019 0.0  0.0 - 0.2 % Final   Neutrophils Relative % 05/05/2019 66  % Final   Neutro Abs 05/05/2019 4.5  1.7 - 7.7 K/uL Final   Lymphocytes  Relative 05/05/2019 22  % Final   Lymphs Abs 05/05/2019 1.5  0.7 - 4.0 K/uL Final   Monocytes Relative 05/05/2019 9  % Final   Monocytes Absolute 05/05/2019 0.6  0.1 - 1.0 K/uL Final   Eosinophils Relative 05/05/2019 2  % Final   Eosinophils Absolute 05/05/2019 0.1  0.0 - 0.5 K/uL Final   Basophils Relative 05/05/2019 0  % Final   Basophils Absolute 05/05/2019 0.0  0.0 - 0.1 K/uL Final   Immature Granulocytes 05/05/2019 1  % Final   Abs Immature Granulocytes 05/05/2019 0.04  0.00 - 0.07 K/uL Final   Performed at [facility], [address], [place], [place] [address]    Assessment:  Roberta [name] is a 73 y.o. female with limited stage small cell lung cancer. She had stage IIIB (T3N2M0) disease. She received cisplatin and etoposidefrom 03/05/2017 - 05/14/2017. She received radiationfrom 03/08/2017 - 04/23/2017.  Chest, abdomen, and pelvic CTon 05/30/2017 revealed continued improvement in the right upper lobe mass, right hilar and mediastinal lymphadenopathy consistent with response to therapy. there was interval development of multiple small ill-defined peribronchovascular nodules in both lungs, greatest in the right upper lobe. Some of these were clustered, and were not associated with the bronchovascular bundles such that lymphangitic tumor was unlikely. These were likely inflammatory. There was no evidence of metastatic disease in the abdomen or pelvis  She received PCIfrom 06/19/2017 - 07/11/2017.  Chest, abdomen, and pelvic CTon 06/07/2018 revealed interval development of L4 vertebral body burst fracturewith mild retropulsion of the posterior aspect of the vertebral body. There was associated sclerosis of the L4 vertebral body with significant height loss predominately involving the anterior and mid  aspect of the vertebral body. Possibility of pathologic fracture was not excluded. There was a nondisplacedright L4 transverse process fracture. Recommend further evaluation with lumbar spine MRI. There were findings suggestive of mild acute sigmoid colonic diverticulitis. There was similar to mild interval increase in size of previously described lesion within the right upper lobe (3.1 x 1.8 cm to 3.2 x 2.2 cm). Adjacent irregular nodulealong the inferior margin within the right upper lobe was grossly similar when compared to prior (1.9 x 1.1 cm to 1.2 x 2.0 cm). Additional areas of consolidation within the right lung were grossly similar. There was interval improvement in previously described tree-in-bud nodularity within the left lower lobe with slight interval increase in ground-glass nodularity within the right lower lobe suggestive of atypical infectious process (MAI).  PETscanon03/16/2020 revealed a 3.1 x 3.2 focus area of opacity in the RIGHTupper lobe (SUV 2.0). Additionally, there was a 1.9 x 2.2 cm perihilar RIGHTlung lesion extending into the superior aspect of the RIGHThilum (SUV 1.8). Both areas considered to be infectious/inflammatory, however low-grade neoplasm cannot be excluded. Short-term follow-up recommended.  ChestCTon 11/12/2018 revealed expected evolution of post radiation pneumonitis/fibrosis in the right suprahilar region.There was no evidence of local recurrence or metastatic disease.There was no adenopathy. There was aorticatherosclerosis.  Chest, abdomen, and pelvis CT on 05/05/2019 revealed evolving postradiation mass-like fibrosis in the upper right lung, similar to the prior study. There was slight enlargement of small right pleural effusion. There were no definitive findings to suggest locally recurrent disease or metastatic disease in the chest, abdomen or pelvis. There was a new compression fracture of superior endplate of T5 with 40% loss of anterior  vertebral body height, favored to be a benign compression fracture. There was severe colonic diverticulosis without evidence of acute diverticulitis, aortic atherosclerosis, in addition  to left main and 3 vessel coronary artery disease. There were calcifications of the aortic valve and mitral annulus. There was diffuse bronchial wall thickening with very mild centrilobular and mild paraseptal emphysema; imaging findings suggestive of underlying COPD.  She underwent kyphoplasty of L4with radiofrequency ablation on 06/11/2018. Pathologyrevealed viable bone and trilineage metabolic marrow with reactive changes and focal changes of fracture callus. IHC stains for pancytokeratin were negative. There was no evidence of metastatic disease.  She has a history of left breast cancerin 2002. She underwent left mastectomy followed by adjuvant chemotherapy. She was treated by Dr. Oliva Bustard. She did not receive adjuvant radiation.  Symptomatically, she feels "ok".  She notes back pain if standing too long.  She has a chronic cough.  She is cutting back on smoking.  Plan: 1.   Review labs from 05/05/2019. 2.Limited stage SCLC Clinically, she is doing well.  Review restaging studies from 05/05/2019.  Images reviewed.  Agree with radiology.  No evidence of recurrent disease.  New T5 compression fracture.  Continue to encourage smoking cessation.  Patient down to 1/2 pack/day. 3.L4 burst fractures/pkyphoplasty Patient has chronic back pain. She has had some relief with prednisone.  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 4.T5 superior endplate compression fracture  New.  Patient appears asymptomatic.  Note to Dr Rudene Christians.  Discuss bone density study secondary to recurrent compression fractures.   Consider bisphosphonate or antibody to RANKL (Prolia) if osteoporosis documented. 5.   Port-a-cath maintenance  Patient notes issues with blood draw from port.  Follow-up with infusion center for port  flush, possible CXR for line placement and Cathflo. 6.   Schedule bone density. 7.   RTC in 3 months for MD assessment, labs (CBC with diff, CMP), port flush, and review of bone density.  I discussed the assessment and treatment plan with the patient.  The patient was provided an opportunity to ask questions and all were answered.  The patient agreed with the plan and demonstrated an understanding of the instructions.  The patient was advised to call back or seek an in person evaluation if the symptoms worsen or if the condition fails to improve as anticipated.  I provided 12 minutes (9:34 AM - 9:45 AM) of face-to-face video visit time during this this encounter and > 50% was spent counseling as documented under my assessment and plan.  I provided these services from the [facility] office.   Nolon Stalls, MD, PhD  05/06/2019, 9:34 AM  I, Selena Batten, am acting as scribe for [employer]. Mike Gip, MD, PhD.  I, Omega Durante C. Mike Gip, MD, have reviewed the above documentation for accuracy and completeness, and I agree with the above.

## 2019-05-05 ENCOUNTER — Ambulatory Visit
Admission: RE | Admit: 2019-05-05 | Discharge: 2019-05-05 | Disposition: A | Discharge status: Home / Self Care | Payer: Medicare HMO | Source: Ambulatory Visit | Attending: Hematology and Oncology | Admitting: Hematology and Oncology

## 2019-05-05 ENCOUNTER — Other Ambulatory Visit: Payer: Self-pay

## 2019-05-05 ENCOUNTER — Inpatient Hospital Stay: Payer: Medicare HMO | Attending: Hematology and Oncology

## 2019-05-05 DIAGNOSIS — Z452 Encounter for adjustment and management of vascular access device: Secondary | ICD-10-CM | POA: Insufficient documentation

## 2019-05-05 DIAGNOSIS — C3411 Malignant neoplasm of upper lobe, right bronchus or lung: Secondary | ICD-10-CM | POA: Insufficient documentation

## 2019-05-05 LAB — CBC WITH DIFFERENTIAL/PLATELET
Abs Immature Granulocytes: 0.04 10*3/uL (ref 0.00–0.07)
Basophils Absolute: 0 10*3/uL (ref 0.0–0.1)
Basophils Relative: 0 %
Eosinophils Absolute: 0.1 10*3/uL (ref 0.0–0.5)
Eosinophils Relative: 2 %
HCT: 38.1 % (ref 36.0–46.0)
Hemoglobin: 12.5 g/dL (ref 12.0–15.0)
Immature Granulocytes: 1 %
Lymphocytes Relative: 22 %
Lymphs Abs: 1.5 10*3/uL (ref 0.7–4.0)
MCH: 29.8 pg (ref 26.0–34.0)
MCHC: 32.8 g/dL (ref 30.0–36.0)
MCV: 90.7 fL (ref 80.0–100.0)
Monocytes Absolute: 0.6 10*3/uL (ref 0.1–1.0)
Monocytes Relative: 9 %
Neutro Abs: 4.5 10*3/uL (ref 1.7–7.7)
Neutrophils Relative %: 66 %
Platelets: 227 10*3/uL (ref 150–400)
RBC: 4.2 MIL/uL (ref 3.87–5.11)
RDW: 13.7 % (ref 11.5–15.5)
WBC: 6.7 10*3/uL (ref 4.0–10.5)
nRBC: 0 % (ref 0.0–0.2)

## 2019-05-05 LAB — COMPREHENSIVE METABOLIC PANEL
ALT: 11 U/L (ref 0–44)
AST: 17 U/L (ref 15–41)
Albumin: 3.5 g/dL (ref 3.5–5.0)
Alkaline Phosphatase: 92 U/L (ref 38–126)
Anion gap: 8 (ref 5–15)
BUN: 19 mg/dL (ref 8–23)
CO2: 26 mmol/L (ref 22–32)
Calcium: 8.9 mg/dL (ref 8.9–10.3)
Chloride: 99 mmol/L (ref 98–111)
Creatinine, Ser: 0.9 mg/dL (ref 0.44–1.00)
GFR calc Af Amer: >60 mL/min (ref >60)
GFR calc non Af Amer: >60 mL/min (ref >60)
Glucose, Bld: 98 mg/dL (ref 70–99)
Potassium: 4.2 mmol/L (ref 3.5–5.1)
Sodium: 133 mmol/L — ABNORMAL LOW (ref 135–145)
Total Bilirubin: 0.4 mg/dL (ref 0.3–1.2)
Total Protein: 6.7 g/dL (ref 6.5–8.1)

## 2019-05-05 MED ORDER — HEPARIN SOD (PORK) LOCK FLUSH 100 UNIT/ML IV SOLN
500.0000 [IU] | Freq: Once | INTRAVENOUS | Status: AC
  Administered 2019-05-05: 500 [IU] via INTRAVENOUS

## 2019-05-05 MED ORDER — IOHEXOL 300 MG/ML  SOLN
100.0000 mL | Freq: Once | INTRAMUSCULAR | Status: AC | PRN
  Administered 2019-05-05: 100 mL via INTRAVENOUS

## 2019-05-05 MED ORDER — SODIUM CHLORIDE 0.9% FLUSH
10.0000 mL | INTRAVENOUS | Status: DC | PRN
  Administered 2019-05-05: 10 mL via INTRAVENOUS
  Filled 2019-05-05: qty 10

## 2019-05-05 NOTE — Progress Notes (Signed)
Confirmed Name, DOB, and Address. Denies any concerns.  

## 2019-05-06 ENCOUNTER — Inpatient Hospital Stay (HOSPITAL_BASED_OUTPATIENT_CLINIC_OR_DEPARTMENT_OTHER): Payer: Medicare HMO | Admitting: Hematology and Oncology

## 2019-05-06 DIAGNOSIS — C3411 Malignant neoplasm of upper lobe, right bronchus or lung: Secondary | ICD-10-CM

## 2019-05-06 DIAGNOSIS — J449 Chronic obstructive pulmonary disease, unspecified: Secondary | ICD-10-CM

## 2019-05-06 DIAGNOSIS — Z853 Personal history of malignant neoplasm of breast: Secondary | ICD-10-CM

## 2019-05-06 DIAGNOSIS — S22050A Wedge compression fracture of T5-T6 vertebra, initial encounter for closed fracture: Secondary | ICD-10-CM

## 2019-05-06 DIAGNOSIS — S32041S Stable burst fracture of fourth lumbar vertebra, sequela: Secondary | ICD-10-CM

## 2019-05-06 DIAGNOSIS — Z9221 Personal history of antineoplastic chemotherapy: Secondary | ICD-10-CM

## 2019-05-06 DIAGNOSIS — Z95828 Presence of other vascular implants and grafts: Secondary | ICD-10-CM

## 2019-05-06 DIAGNOSIS — M545 Low back pain: Secondary | ICD-10-CM

## 2019-05-06 DIAGNOSIS — Z923 Personal history of irradiation: Secondary | ICD-10-CM

## 2019-05-06 DIAGNOSIS — F17210 Nicotine dependence, cigarettes, uncomplicated: Secondary | ICD-10-CM | POA: Diagnosis not present

## 2019-05-14 ENCOUNTER — Other Ambulatory Visit: Payer: Medicare HMO

## 2019-05-20 ENCOUNTER — Encounter: Payer: Self-pay | Admitting: Hematology and Oncology

## 2019-05-20 NOTE — Progress Notes (Signed)
No new changes noted today 

## 2019-05-21 ENCOUNTER — Inpatient Hospital Stay (HOSPITAL_BASED_OUTPATIENT_CLINIC_OR_DEPARTMENT_OTHER): Payer: Medicare HMO | Admitting: Hematology and Oncology

## 2019-05-21 ENCOUNTER — Ambulatory Visit
Admission: RE | Admit: 2019-05-21 | Discharge: 2019-05-21 | Disposition: A | Discharge status: Home / Self Care | Payer: Medicare HMO | Source: Ambulatory Visit | Attending: Hematology and Oncology | Admitting: Hematology and Oncology

## 2019-05-21 DIAGNOSIS — Z800 Family history of malignant neoplasm of digestive organs: Secondary | ICD-10-CM

## 2019-05-21 DIAGNOSIS — M545 Low back pain: Secondary | ICD-10-CM

## 2019-05-21 DIAGNOSIS — M810 Age-related osteoporosis without current pathological fracture: Secondary | ICD-10-CM

## 2019-05-21 DIAGNOSIS — Z79899 Other long term (current) drug therapy: Secondary | ICD-10-CM

## 2019-05-21 DIAGNOSIS — S22050A Wedge compression fracture of T5-T6 vertebra, initial encounter for closed fracture: Secondary | ICD-10-CM | POA: Diagnosis not present

## 2019-05-21 DIAGNOSIS — F419 Anxiety disorder, unspecified: Secondary | ICD-10-CM

## 2019-05-21 DIAGNOSIS — J449 Chronic obstructive pulmonary disease, unspecified: Secondary | ICD-10-CM

## 2019-05-21 DIAGNOSIS — R450 Nervousness: Secondary | ICD-10-CM

## 2019-05-21 DIAGNOSIS — Z7982 Long term (current) use of aspirin: Secondary | ICD-10-CM

## 2019-05-21 DIAGNOSIS — E785 Hyperlipidemia, unspecified: Secondary | ICD-10-CM

## 2019-05-21 DIAGNOSIS — R05 Cough: Secondary | ICD-10-CM

## 2019-05-21 DIAGNOSIS — F17210 Nicotine dependence, cigarettes, uncomplicated: Secondary | ICD-10-CM | POA: Diagnosis not present

## 2019-05-21 DIAGNOSIS — Z853 Personal history of malignant neoplasm of breast: Secondary | ICD-10-CM

## 2019-05-21 DIAGNOSIS — C3411 Malignant neoplasm of upper lobe, right bronchus or lung: Secondary | ICD-10-CM | POA: Diagnosis not present

## 2019-05-21 DIAGNOSIS — Z923 Personal history of irradiation: Secondary | ICD-10-CM

## 2019-05-21 NOTE — Patient Instructions (Signed)
Denosumab injection What is this medicine? DENOSUMAB (den oh sue mab) slows bone breakdown. Prolia is used to treat osteoporosis in women after menopause and in men, and in people who are taking corticosteroids for 6 months or more. Xgeva is used to treat a high calcium level due to cancer and to prevent bone fractures and other bone problems caused by multiple myeloma or cancer bone metastases. Xgeva is also used to treat giant cell tumor of the bone. This medicine may be used for other purposes; ask your health care provider or pharmacist if you have questions. COMMON BRAND NAME(S): Prolia, XGEVA What should I tell my health care provider before I take this medicine? They need to know if you have any of these conditions:  dental disease  having surgery or tooth extraction  infection  kidney disease  low levels of calcium or Vitamin D in the blood  malnutrition  on hemodialysis  skin conditions or sensitivity  thyroid or parathyroid disease  an unusual reaction to denosumab, other medicines, foods, dyes, or preservatives  pregnant or trying to get pregnant  breast-feeding How should I use this medicine? This medicine is for injection under the skin. It is given by a health care professional in a hospital or clinic setting. A special MedGuide will be given to you before each treatment. Be sure to read this information carefully each time. For Prolia, talk to your pediatrician regarding the use of this medicine in children. Special care may be needed. For Xgeva, talk to your pediatrician regarding the use of this medicine in children. While this drug may be prescribed for children as young as 13 years for selected conditions, precautions do apply. Overdosage: If you think you have taken too much of this medicine contact a poison control center or emergency room at once. NOTE: This medicine is only for you. Do not share this medicine with others. What if I miss a dose? It is  important not to miss your dose. Call your doctor or health care professional if you are unable to keep an appointment. What may interact with this medicine? Do not take this medicine with any of the following medications:  other medicines containing denosumab This medicine may also interact with the following medications:  medicines that lower your chance of fighting infection  steroid medicines like prednisone or cortisone This list may not describe all possible interactions. Give your health care provider a list of all the medicines, herbs, non-prescription drugs, or dietary supplements you use. Also tell them if you smoke, drink alcohol, or use illegal drugs. Some items may interact with your medicine. What should I watch for while using this medicine? Visit your doctor or health care professional for regular checks on your progress. Your doctor or health care professional may order blood tests and other tests to see how you are doing. Call your doctor or health care professional for advice if you get a fever, chills or sore throat, or other symptoms of a cold or flu. Do not treat yourself. This drug may decrease your body's ability to fight infection. Try to avoid being around people who are sick. You should make sure you get enough calcium and vitamin D while you are taking this medicine, unless your doctor tells you not to. Discuss the foods you eat and the vitamins you take with your health care professional. See your dentist regularly. Brush and floss your teeth as directed. Before you have any dental work done, tell your dentist you are   receiving this medicine. Do not become pregnant while taking this medicine or for 5 months after stopping it. Talk with your doctor or health care professional about your birth control options while taking this medicine. Women should inform their doctor if they wish to become pregnant or think they might be pregnant. There is a potential for serious side  effects to an unborn child. Talk to your health care professional or pharmacist for more information. What side effects may I notice from receiving this medicine? Side effects that you should report to your doctor or health care professional as soon as possible:  allergic reactions like skin rash, itching or hives, swelling of the face, lips, or tongue  bone pain  breathing problems  dizziness  jaw pain, especially after dental work  redness, blistering, peeling of the skin  signs and symptoms of infection like fever or chills; cough; sore throat; pain or trouble passing urine  signs of low calcium like fast heartbeat, muscle cramps or muscle pain; pain, tingling, numbness in the hands or feet; seizures  unusual bleeding or bruising  unusually weak or tired Side effects that usually do not require medical attention (report to your doctor or health care professional if they continue or are bothersome):  constipation  diarrhea  headache  joint pain  loss of appetite  muscle pain  runny nose  tiredness  upset stomach This list may not describe all possible side effects. Call your doctor for medical advice about side effects. You may report side effects to FDA at 1-800-FDA-1088. Where should I keep my medicine? This medicine is only given in a clinic, doctor's office, or other health care setting and will not be stored at home. NOTE: This sheet is a summary. It may not cover all possible information. If you have questions about this medicine, talk to your doctor, pharmacist, or health care provider.  2020 Elsevier/Gold Standard (2017-09-21 16:10:44)

## 2019-05-21 NOTE — Progress Notes (Signed)
[facility]  [address], [address] [place], [place] [address] Phone: [phone]  Fax: [phone]   Telemedicine Office Visit:  05/21/2019  Referring physician: Gayland Curry, MD  I connected with Orlean Bradford Gornick on 05/21/2019 at 4:08 PM by videoconferencing and verified that I was speaking with the correct person using 2 identifiers.  The patient was at home.  I discussed the limitations, risk, security and privacy concerns of performing an evaluation and management service by videoconferencing and the availability of in person appointments.  I also discussed with the patient that there may be a patient responsible charge related to this service.  The patient expressed understanding and agreed to proceed.   Chief Complaint: Roberta [name] is a 73 y.o. female with limited stage small cell lung cancer who is seen for 2 week assessment.  HPI: The patient was last seen in the medical oncology clinic on 05/06/2019 for a telemedicine visit. At that time, she felt "ok". She noted back pain if standing too long. She had a chronic cough. She was cutting back on smoking. CBC was unremarkable. Sodium was 133. I encouraged smoking cessation.  She had a T5 superior endplate compression fracture.  Bone density was scheduled.   Bone density on 05/21/2019 revealed osteoporosis with a T-score of -3.0 in the left femoral hip and -2.4 in the left forearm radius.  During the interim, she has felt "nervous". She is smoking half a pack a day. She continues to have a cough and lower back pain. She denies any dental issues. She only notes 4 teeth and it being "awhile" since she has been to the dentist.  We discussed calcium and possible Prolia.  She was interested in Prolia. She would like to be seen by Dr. Rudene Christians.   Past Medical History:  Diagnosis Date  . AAA (abdominal aortic aneurysm) ([place])   . Anemia   . Breast cancer ([place]) 2002   left  . COPD (chronic obstructive  pulmonary disease) ([place])   . Dyspnea   . Headache   . High cholesterol   . Personal history of chemotherapy   . Small cell lung cancer ([place])   . Tobacco abuse     Past Surgical History:  Procedure Laterality Date  . ABDOMINAL HYSTERECTOMY    . ARTERY BIOPSY Right 12/05/2017   Procedure: BIOPSY TEMPORAL ARTERY;  Surgeon: Margaretha Sheffield, MD;  Location: [facility] ORS;  Service: ENT;  Laterality: Right;  . ENDOBRONCHIAL ULTRASOUND N/A 02/19/2017   Procedure: ENDOBRONCHIAL ULTRASOUND;  Surgeon: Flora Lipps, MD;  Location: [facility] ORS;  Service: Cardiopulmonary;  Laterality: N/A;  . ENDOVASCULAR REPAIR/STENT GRAFT N/A 12/06/2016   Procedure: Endovascular Repair/Stent Graft;  Surgeon: Katha Cabal, MD;  Location: [facility];  Service: Cardiovascular;  Laterality: N/A;  . IR FLUORO GUIDE PORT INSERTION RIGHT  03/02/2017  . KYPHOPLASTY N/A 06/11/2018   Procedure: KYPHOPLASTY L4 BIOPSY WITH RFA;  Surgeon: Hessie Knows, MD;  Location: [facility] ORS;  Service: Orthopedics;  Laterality: N/A;  . MASTECTOMY Left 2002   with sentinel node     Family History  Problem Relation Age of Onset  . CAD Mother   . Colon cancer Brother   . Breast cancer Neg Hx     Social History:  reports that she has been smoking cigarettes. She has a 25.00 pack-year smoking history. She has never used smokeless tobacco. She reports that she does not drink alcohol or use drugs. 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 The patient's sister's name is [name]. The patient is alone today.  Participants in the patient's visit and their role in the encounter included the patient and Vito Berger, CMA, today.  The intake visit was provided by Vito Berger, CMA.  Allergies: No Known Allergies  Current Medications: Current Outpatient Medications  Medication Sig Dispense Refill    . acetaminophen (TYLENOL) 325 MG tablet Take 650 mg by mouth every 6 (six) hours as needed for mild pain.     Marland Kitchen albuterol (2.5 MG/3ML) 0.083% NEBU 3 mL, albuterol (5 MG/ML) 0.5% NEBU 0.5 mL Inhale 1 mg into the lungs.    Marland Kitchen aspirin EC 81 MG tablet Take 81 mg by mouth daily.    Marland Kitchen atorvastatin (LIPITOR) 10 MG tablet Take 10 mg by mouth at bedtime.     . fluticasone-salmeterol (ADVAIR HFA) 115-21 MCG/ACT inhaler Inhale 2 puffs into the lungs 2 (two) times daily.    Marland Kitchen loratadine (CLARITIN) 10 MG tablet Take 1 tablet (10 mg total) by mouth daily. 30 tablet 1  . Multiple Vitamin (MULTIVITAMIN) tablet Take 1 tablet by mouth daily.    Marland Kitchen albuterol (PROVENTIL HFA;VENTOLIN HFA) 108 (90 Base) MCG/ACT inhaler Inhale 2 puffs into the lungs every 6 (six) hours as needed for wheezing or shortness of breath. (Patient not taking: Reported on 05/20/2019) 1 Inhaler 1   No current facility-administered medications for this visit.   Facility-Administered Medications Ordered in Other Visits  Medication Dose Route Frequency Provider Last Rate Last Admin  . sodium chloride flush (NS) 0.9 % injection 10 mL  10 mL Intravenous PRN Sindy Guadeloupe, MD   10 mL at 03/26/17 0948  . sodium chloride flush (NS) 0.9 % injection 10 mL  10 mL Intravenous PRN Sindy Guadeloupe, MD   10 mL at 04/16/17 1006  . sodium chloride flush (NS) 0.9 % injection 10 mL  10 mL Intravenous PRN Sindy Guadeloupe, MD   10 mL at 01/10/18 1410  . sodium chloride flush (NS) 0.9 % injection 10 mL  10 mL Intravenous PRN Lequita Asal, MD   10 mL at 12/11/18 1033    Review of Systems  Constitutional: Negative for chills, diaphoresis, fever, malaise/fatigue and weight loss.       Feels "nervous".  HENT: Negative.  Negative for congestion, ear pain, hearing loss, nosebleeds, sinus pain and sore throat.   Eyes: Negative.  Negative for blurred vision, double vision and photophobia.  Respiratory: Positive for cough (chronic). Negative for hemoptysis, sputum  production and shortness of breath.        COPD.  Smokes half a pack a day.  Cardiovascular: Negative.  Negative for chest pain, palpitations, orthopnea, leg swelling and PND.  Gastrointestinal: Negative.  Negative for abdominal pain, blood in stool, constipation, diarrhea, melena, nausea and vomiting.  Genitourinary: Negative.  Negative for dysuria, frequency, hematuria and urgency.  Musculoskeletal: Positive for back pain (lower back; s/p kyphoplasty). Negative for falls, joint pain and myalgias.  Skin: Negative.  Negative for itching and rash.  Neurological: Negative.  Negative for dizziness, tingling, sensory change, speech change, focal weakness, weakness and headaches.  Endo/Heme/Allergies: Negative.  Negative for environmental allergies. Does not bruise/bleed easily.  Psychiatric/Behavioral: Negative for depression, memory loss and substance abuse. The patient is nervous/anxious. The patient does not have insomnia.   All  other systems reviewed and are negative.  Performance status (ECOG): 1-2   No visits with results within 3 Day(s) from this visit.  Latest known visit with results is:  Infusion on 05/05/2019  Component Date Value Ref Range Status  . Sodium 05/05/2019 133* 135 - 145 mmol/L Final  . Potassium 05/05/2019 4.2  3.5 - 5.1 mmol/L Final  . Chloride 05/05/2019 99  98 - 111 mmol/L Final  . CO2 05/05/2019 26  22 - 32 mmol/L Final  . Glucose, Bld 05/05/2019 98  70 - 99 mg/dL Final  . BUN 05/05/2019 19  8 - 23 mg/dL Final  . Creatinine, Ser 05/05/2019 0.90  0.44 - 1.00 mg/dL Final  . Calcium 05/05/2019 8.9  8.9 - 10.3 mg/dL Final  . Total Protein 05/05/2019 6.7  6.5 - 8.1 g/dL Final  . Albumin 05/05/2019 3.5  3.5 - 5.0 g/dL Final  . AST 05/05/2019 17  15 - 41 U/L Final  . ALT 05/05/2019 11  0 - 44 U/L Final  . Alkaline Phosphatase 05/05/2019 92  38 - 126 U/L Final  . Total Bilirubin 05/05/2019 0.4  0.3 - 1.2 mg/dL Final  . GFR calc non Af Amer 05/05/2019 >60  >60 mL/min  Final  . GFR calc Af Amer 05/05/2019 >60  >60 mL/min Final  . Anion gap 05/05/2019 8  5 - 15 Final   Performed at [facility], [address] [place], [place] [address]  . WBC 05/05/2019 6.7  4.0 - 10.5 K/uL Final  . RBC 05/05/2019 4.20  3.87 - 5.11 MIL/uL Final  . Hemoglobin 05/05/2019 12.5  12.0 - 15.0 g/dL Final  . HCT 05/05/2019 38.1  36.0 - 46.0 % Final  . MCV 05/05/2019 90.7  80.0 - 100.0 fL Final  . MCH 05/05/2019 29.8  26.0 - 34.0 pg Final  . MCHC 05/05/2019 32.8  30.0 - 36.0 g/dL Final  . RDW 05/05/2019 13.7  11.5 - 15.5 % Final  . Platelets 05/05/2019 227  150 - 400 K/uL Final  . nRBC 05/05/2019 0.0  0.0 - 0.2 % Final  . Neutrophils Relative % 05/05/2019 66  % Final  . Neutro Abs 05/05/2019 4.5  1.7 - 7.7 K/uL Final  . Lymphocytes Relative 05/05/2019 22  % Final  . Lymphs Abs 05/05/2019 1.5  0.7 - 4.0 K/uL Final  . Monocytes Relative 05/05/2019 9  % Final  . Monocytes Absolute 05/05/2019 0.6  0.1 - 1.0 K/uL Final  . Eosinophils Relative 05/05/2019 2  % Final  . Eosinophils Absolute 05/05/2019 0.1  0.0 - 0.5 K/uL Final  . Basophils Relative 05/05/2019 0  % Final  . Basophils Absolute 05/05/2019 0.0  0.0 - 0.1 K/uL Final  . Immature Granulocytes 05/05/2019 1  % Final  . Abs Immature Granulocytes 05/05/2019 0.04  0.00 - 0.07 K/uL Final   Performed at [facility], [address], [place], [place] [address]    Assessment:  KADIJA CRUZEN is a 73 y.o. female with limited stage small cell lung cancer. She had stage IIIB (T3N2M0) disease. She received cisplatin and etoposidefrom 03/05/2017 - 05/14/2017. She received radiationfrom 03/08/2017 - 04/23/2017.  Chest, abdomen, and pelvic CTon 05/30/2017 revealed continued improvement in the right upper lobe mass, right hilar and mediastinal lymphadenopathy consistent with response to therapy. there was interval development of multiple small ill-defined peribronchovascular nodules in both lungs,  greatest in the right upper lobe. Some of these were clustered, and were not associated with  the bronchovascular bundles such that lymphangitic tumor was unlikely. These were likely inflammatory. There was no evidence of metastatic disease in the abdomen or pelvis  She received PCIfrom 06/19/2017 - 07/11/2017.  Chest, abdomen, and pelvic CTon 06/07/2018 revealed interval development of L4 vertebral body burst fracturewith mild retropulsion of the posterior aspect of the vertebral body. There was associated sclerosis of the L4 vertebral body with significant height loss predominately involving the anterior and mid aspect of the vertebral body. Possibility of pathologic fracture was not excluded. There was a nondisplacedright L4 transverse process fracture. Recommend further evaluation with lumbar spine MRI. There were findings suggestive of mild acute sigmoid colonic diverticulitis. There was similar to mild interval increase in size of previously described lesion within the right upper lobe (3.1 x 1.8 cm to 3.2 x 2.2 cm). Adjacent irregular nodulealong the inferior margin within the right upper lobe was grossly similar when compared to prior (1.9 x 1.1 cm to 1.2 x 2.0 cm). Additional areas of consolidation within the right lung were grossly similar. There was interval improvement in previously described tree-in-bud nodularity within the left lower lobe with slight interval increase in ground-glass nodularity within the right lower lobe suggestive of atypical infectious process (MAI).  PETscanon03/16/2020 revealed a 3.1 x 3.2 focus area of opacity in the RIGHTupper lobe (SUV 2.0). Additionally, there was a 1.9 x 2.2 cm perihilar RIGHTlung lesion extending into the superior aspect of the RIGHThilum (SUV 1.8). Both areas considered to be infectious/inflammatory, however low-grade neoplasm cannot be excluded. Short-term follow-up recommended.  ChestCTon 11/12/2018 revealed expected  evolution of post radiation pneumonitis/fibrosis in the right suprahilar region.There was no evidence of local recurrence or metastatic disease.There was no adenopathy. There was aorticatherosclerosis.  Chest, abdomen, and pelvis CT on 05/05/2019 revealed evolving postradiation mass-like fibrosis in the upper right lung, similar to the prior study. There was slight enlargement of small right pleural effusion. There were no definitive findings to suggest locally recurrent disease or metastatic disease in the chest, abdomen or pelvis. There was a new compression fracture of superior endplate of T5 with 58% loss of anterior vertebral body height, favored to be a benign compression fracture. There was severe colonic diverticulosis without evidence of acute diverticulitis, aortic atherosclerosis, in addition to left main and 3 vessel coronary artery disease. There were calcifications of the aortic valve and mitral annulus. There was diffuse bronchial wall thickening with very mild centrilobular and mild paraseptal emphysema; imaging findings suggestive of underlying COPD.  She underwent kyphoplasty of L4with radiofrequency ablation on 06/11/2018. Pathologyrevealed viable bone and trilineage metabolic marrow with reactive changes and focal changes of fracture callus. IHC stains for pancytokeratin were negative. There was no evidence of metastatic disease.  Bone density on 05/21/2019 revealed osteoporosis with a T-score of -3.0 in the left femoral hip and -2.4 in the left forearm radius.  She has a history of left breast cancerin 2002. She underwent left mastectomy followed by adjuvant chemotherapy. She was treated by Dr. Oliva Bustard. She did not receive adjuvant radiation.  Symptomatically, she denies any increased shortness of breath.  She is smoking 1/2 pack a day.  Plan: 1.   Labs today: CBC with diff, CMP. 2.Limited stage SCLC Clinically, she is doing well.  Chest, abdomen, and pelvis CT on  05/05/2019 revealed no evidence of recurrent disease.            She has a new T5 compression fracture.  Discuss follow-up with Dr Rudene Christians.  Encourage smoking cessation.  Patient down to 1/2 pack/day. 3.L4 burst fractures/pkyphoplasty She has chronic back pain.  Orthopedics note indicates she had sacroiliitis (12/12/2018 note). She had some relief with prednisone. 4.T5 superior endplate compression fracture             She appears asymptomatic.             She has not met with Dr Rudene Christians.             Bone density on 05/21/2019 revealed osteoporosis with a T-score of -3.0 in the left femoral hip and -2.4 in the left forearm radius.   Discuss calcium and vitamin D.   Consider Prolia (information provided).    Discuss dental clearance needed for Prolia.    Preauth Prolia.   Send patient copy of bone density study. 5.   Port-a-cath maintenance             RTC as scheduled on 06/16/2018 for port flush and possible declotting. 6.   Schedule appt with Dr Rudene Christians (well known to him) for T5 endplate compression fracture. 7.   RTC as scheduled on 06/16/2018 for port flush and possible declotting. 8.   RTC as scheduled on 07/29/2018 for MD assessment, labs, and possible Xgeva.  I discussed the assessment and treatment plan with the patient.  The patient was provided an opportunity to ask questions and all were answered.  The patient agreed with the plan and demonstrated an understanding of the instructions.  The patient was advised to call back if the symptoms worsen or if the condition fails to improve as anticipated.  I provided 18 minutes (4:08 PM - 4:25 PM) of face-to-face video visit time during this this encounter and > 50% was spent counseling as documented under my assessment and plan.  I provided these services from the [facility] office.   Lequita Asal, MD, PhD    05/21/2019, 4:08 PM  I, Selena Batten, am acting as scribe for [employer]. Mike Gip, MD, PhD.  I, Taiylor Virden C.  Mike Gip, MD, have reviewed the above documentation for accuracy and completeness, and I agree with the above.

## 2019-06-11 ENCOUNTER — Other Ambulatory Visit: Payer: Self-pay | Admitting: Hematology and Oncology

## 2019-06-17 ENCOUNTER — Inpatient Hospital Stay: Payer: Medicare HMO | Attending: Hematology and Oncology

## 2019-06-17 ENCOUNTER — Other Ambulatory Visit: Payer: Self-pay

## 2019-06-17 VITALS — Temp 97.2°F | Resp 18

## 2019-06-17 DIAGNOSIS — C3411 Malignant neoplasm of upper lobe, right bronchus or lung: Secondary | ICD-10-CM | POA: Insufficient documentation

## 2019-06-17 DIAGNOSIS — Z452 Encounter for adjustment and management of vascular access device: Secondary | ICD-10-CM | POA: Insufficient documentation

## 2019-06-17 DIAGNOSIS — Z95828 Presence of other vascular implants and grafts: Secondary | ICD-10-CM

## 2019-06-17 MED ORDER — SODIUM CHLORIDE 0.9% FLUSH
10.0000 mL | INTRAVENOUS | Status: DC | PRN
  Administered 2019-06-17: 10 mL via INTRAVENOUS
  Filled 2019-06-17: qty 10

## 2019-06-17 MED ORDER — HEPARIN SOD (PORK) LOCK FLUSH 100 UNIT/ML IV SOLN
500.0000 [IU] | Freq: Once | INTRAVENOUS | Status: AC
  Administered 2019-06-17: 14:00:00 500 [IU] via INTRAVENOUS
  Filled 2019-06-17: qty 5

## 2019-07-25 ENCOUNTER — Encounter: Payer: Self-pay | Admitting: Radiation Oncology

## 2019-07-25 ENCOUNTER — Other Ambulatory Visit: Payer: Self-pay

## 2019-07-25 ENCOUNTER — Ambulatory Visit
Admission: RE | Admit: 2019-07-25 | Discharge: 2019-07-25 | Disposition: A | Discharge status: Home / Self Care | Payer: Medicare HMO | Source: Ambulatory Visit | Attending: Radiation Oncology | Admitting: Radiation Oncology

## 2019-07-25 VITALS — BP 105/66 | HR 82 | Temp 97.3°F | Wt 125.0 lb

## 2019-07-25 DIAGNOSIS — Z923 Personal history of irradiation: Secondary | ICD-10-CM | POA: Diagnosis not present

## 2019-07-25 DIAGNOSIS — F17210 Nicotine dependence, cigarettes, uncomplicated: Secondary | ICD-10-CM | POA: Diagnosis not present

## 2019-07-25 DIAGNOSIS — M4854XD Collapsed vertebra, not elsewhere classified, thoracic region, subsequent encounter for fracture with routine healing: Secondary | ICD-10-CM | POA: Insufficient documentation

## 2019-07-25 DIAGNOSIS — Z9221 Personal history of antineoplastic chemotherapy: Secondary | ICD-10-CM | POA: Insufficient documentation

## 2019-07-25 DIAGNOSIS — C3401 Malignant neoplasm of right main bronchus: Secondary | ICD-10-CM | POA: Insufficient documentation

## 2019-07-25 NOTE — Progress Notes (Signed)
Radiation Oncology Follow up Note  Name: Roberta [name]   Date:   07/25/2019 MRN:  [id number] DOB: 1946/01/25    This 74 y.o. female presents to the clinic today for 2-1/2-year follow-up status post both concurrent chemoradiation to her chest as well as PCI for limited stage small cell lung cancer.  REFERRING PROVIDER: Gayland Curry, MD  HPI: Patient is a 74 year old female now out 2-1/2 years having completed concurrent chemoradiation therapy as well as PCI for limited stage small cell lung cancer seen today in routine follow-up she is doing well.  She specifically denies cough hemoptysis chest tightness.  She is having no neurologic symptoms..  Recent CT scan back in December shows evolving post radiation changes in the upper right lung similar to prior studies.  No findings to suggest recurrent disease or progressive disease in the chest abdomen or pelvis.  She does have a compression fracture at T5.  COMPLICATIONS OF TREATMENT: none  FOLLOW UP COMPLIANCE: keeps appointments   PHYSICAL EXAM:  BP 105/66   Pulse 82   Temp (!) 97.3 F (36.3 C) (Tympanic)   Wt 125 lb (56.7 kg)   BMI 19.58 kg/m  Well-developed well-nourished patient in NAD. HEENT reveals PERLA, EOMI, discs not visualized.  Oral cavity is clear. No oral mucosal lesions are identified. Neck is clear without evidence of cervical or supraclavicular adenopathy. Lungs are clear to A&P. Cardiac examination is essentially unremarkable with regular rate and rhythm without murmur rub or thrill. Abdomen is benign with no organomegaly or masses noted. Motor sensory and DTR levels are equal and symmetric in the upper and lower extremities. Cranial nerves II through XII are grossly intact. Proprioception is intact. No peripheral adenopathy or edema is identified. No motor or sensory levels are noted. Crude visual fields are within normal range.  RADIOLOGY RESULTS: CT scan chest abdomen pelvis reviewed compatible with above-stated  findings showing no progressive disease  PLAN: Present time patient is doing well 2-1/2 years out from treatment for limited stage small cell lung cancer and pleased with her overall progress.  She continues close follow-up care with medical oncology.  I have asked to see her back in 1 year for follow-up.  Patient knows to call with any concerns at any time.  I would like to take this opportunity to thank you for allowing me to participate in the care of your patient.Noreene Filbert, MD

## 2019-07-27 NOTE — Progress Notes (Signed)
[facility]  [address] [place], [place] [address] Phone: [phone]  Fax: [phone]   Clinic Day:  07/29/2019  Referring physician: Gayland Curry, MD  Chief Complaint: Roberta [name] is a 74 y.o. female with  limited stage small cell lung cancer who is seen for a 3 month assessment.   HPI: The patient was last seen in the medical oncology clinic on 05/21/2019 via telemedicine. At that time, she denied any increased shortness of breath.  She was smoking 1/2 pack a day. CBC was normal. Sodium was 133. Smoking cessation was encouraged. We discussed calcium and vitamin D. We discussed dental clearance needed for Prolia.   Bone density on 05/21/2019 showed osteoporosis with a T-score of -3.0 in the total left femur.   She was seen by Dr. Baruch Gouty on 07/25/2019. She was doing well. She had no complaints. She had a compression fracture at T5. She will follow up in 1 year.   During the interim, the she has been doing ok. She has lost 6 pounds. She is eating well. Her appetite is better since moving in with her daughter. She declined a referral to the nutritionist. I encouraged her to snack more throughout the day. She was provided with Ensure and Boost today. She notes having trouble hearing. She continues to smoke less than half a pack a day. Her daughter does not allow any smoking in the house. She is trying to cut back on smoking. She has no shortness of breath or cough. She recently got her rescue inhaler refilled because her nebulizer broke. Her back pain is better. She has no issues with her anxiety.    Past Medical History:  Diagnosis Date   AAA (abdominal aortic aneurysm) ([place])    Anemia    Breast cancer ([place]) 2002   left   COPD (chronic obstructive pulmonary disease) ([facility])    Dyspnea    Headache    High cholesterol    Personal history of chemotherapy    Small cell lung cancer ([place])    Tobacco abuse     Past Surgical  History:  Procedure Laterality Date   ABDOMINAL HYSTERECTOMY     ARTERY BIOPSY Right 12/05/2017   Procedure: BIOPSY TEMPORAL ARTERY;  Surgeon: Margaretha Sheffield, MD;  Location: [facility] ORS;  Service: ENT;  Laterality: Right;   ENDOBRONCHIAL ULTRASOUND N/A 02/19/2017   Procedure: ENDOBRONCHIAL ULTRASOUND;  Surgeon: Flora Lipps, MD;  Location: [facility] ORS;  Service: Cardiopulmonary;  Laterality: N/A;   ENDOVASCULAR REPAIR/STENT GRAFT N/A 12/06/2016   Procedure: Endovascular Repair/Stent Graft;  Surgeon: Katha Cabal, MD;  Location: [facility];  Service: Cardiovascular;  Laterality: N/A;   IR FLUORO GUIDE PORT INSERTION RIGHT  03/02/2017   KYPHOPLASTY N/A 06/11/2018   Procedure: KYPHOPLASTY L4 BIOPSY WITH RFA;  Surgeon: Hessie Knows, MD;  Location: [facility] ORS;  Service: Orthopedics;  Laterality: N/A;   MASTECTOMY Left 2002   with sentinel node     Family History  Problem Relation Age of Onset   CAD Mother    Colon cancer Brother    Breast cancer Neg Hx     Social History:  reports that she has been smoking cigarettes. She has a 25.00 pack-year smoking history. She has never used smokeless tobacco. She reports that she does not drink alcohol or use drugs. 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 The patient's sister's name is Roberta [name]. She lives with her daughter. The patient is alone today.  Allergies: No Known Allergies  Current Medications: Current Outpatient Medications  Medication Sig Dispense Refill   acetaminophen (TYLENOL) 325 MG tablet Take 650 mg by mouth every 6 (six) hours as needed for mild pain.      albuterol (PROVENTIL HFA;VENTOLIN HFA) 108 (90 Base) MCG/ACT inhaler Inhale 2 puffs into the lungs every 6 (six) hours as needed for wheezing or shortness of breath. 1 Inhaler 1   aspirin EC 81 MG tablet Take 81 mg by mouth  daily.     atorvastatin (LIPITOR) 10 MG tablet Take 10 mg by mouth at bedtime.      fluticasone-salmeterol (ADVAIR HFA) 115-21 MCG/ACT inhaler Inhale 2 puffs into the lungs 2 (two) times daily.     ipratropium (ATROVENT) 0.02 % nebulizer solution Inhale into the lungs.     lidocaine-prilocaine (EMLA) cream Apply 1 application topically as needed.     Multiple Vitamin (MULTIVITAMIN) tablet Take 1 tablet by mouth daily.     albuterol (2.5 MG/3ML) 0.083% NEBU 3 mL, albuterol (5 MG/ML) 0.5% NEBU 0.5 mL Inhale 1 mg into the lungs.     loratadine (CLARITIN) 10 MG tablet Take 1 tablet (10 mg total) by mouth daily. (Patient not taking: Reported on 07/29/2019) 30 tablet 1   No current facility-administered medications for this visit.   Facility-Administered Medications Ordered in Other Visits  Medication Dose Route Frequency Provider Last Rate Last Admin   sodium chloride flush (NS) 0.9 % injection 10 mL  10 mL Intravenous PRN Sindy Guadeloupe, MD   10 mL at 03/26/17 0948   sodium chloride flush (NS) 0.9 % injection 10 mL  10 mL Intravenous PRN Sindy Guadeloupe, MD   10 mL at 04/16/17 1006   sodium chloride flush (NS) 0.9 % injection 10 mL  10 mL Intravenous PRN Sindy Guadeloupe, MD   10 mL at 01/10/18 1410   sodium chloride flush (NS) 0.9 % injection 10 mL  10 mL Intravenous PRN Nolon Stalls C, MD   10 mL at 12/11/18 1033   sodium chloride flush (NS) 0.9 % injection 10 mL  10 mL Intravenous PRN Nolon Stalls C, MD   10 mL at 07/29/19 1030    Review of Systems  Constitutional: Positive for weight loss (6 lbs). Negative for chills, diaphoresis, fever and malaise/fatigue.       Doing ok.  HENT: Positive for hearing loss. Negative for congestion, ear pain, nosebleeds, sinus pain and sore throat.   Eyes: Negative.  Negative for blurred vision, double vision and photophobia.  Respiratory: Negative for cough (chronic), hemoptysis, sputum production and shortness of breath.        COPD.   Smokes half a pack a day.  Cardiovascular: Negative.  Negative for chest pain, palpitations, orthopnea, leg swelling and PND.  Gastrointestinal: Negative.  Negative for abdominal pain, blood in stool, constipation, diarrhea, melena, nausea and vomiting.       Eating well. Appetite better.  Genitourinary: Negative.  Negative for dysuria, frequency, hematuria and urgency.  Musculoskeletal: Negative for back pain (lower back; s/p kyphoplasty; better), falls, joint pain and myalgias.  Skin: Negative.  Negative for itching and rash.  Neurological: Negative.  Negative for dizziness, tingling, sensory change, speech change, focal weakness, weakness and headaches.  Endo/Heme/Allergies: Negative.  Negative for environmental allergies. Does not bruise/bleed easily.  Psychiatric/Behavioral: Negative for  depression, memory loss and substance abuse. The patient is not nervous/anxious and does not have insomnia.   All other systems reviewed and are negative.  Performance status (ECOG): 1-2  Vitals Blood pressure 123/64, pulse 83, temperature 98 F (36.7 C), temperature source Tympanic, resp. rate 19, weight 123 lb (55.8 kg), SpO2 100 %.   Physical Exam  Constitutional: She is oriented to person, place, and time.  Thin woman sitting comfortably in the exam room in no acute distress.  HENT:  Head: Normocephalic and atraumatic.  Mouth/Throat: Oropharynx is clear and moist. No oropharyngeal exudate.  Short brown hair. Mask.  Eyes: Pupils are equal, round, and reactive to light. Conjunctivae and EOM are normal. No scleral icterus.  Cardiovascular: Normal rate and normal heart sounds.  No murmur heard. Pulmonary/Chest: Effort normal and breath sounds normal. No respiratory distress. She has no wheezes. She has no rales. She exhibits no tenderness.  Abdominal: Soft. Bowel sounds are normal. She exhibits no distension and no mass. There is no abdominal tenderness. There is no rebound and no guarding.    Musculoskeletal:        General: No tenderness or edema. Normal range of motion.     Cervical back: Normal range of motion and neck supple.  Lymphadenopathy:       Head (right side): No preauricular, no posterior auricular and no occipital adenopathy present.       Head (left side): No preauricular, no posterior auricular and no occipital adenopathy present.    She has no cervical adenopathy.    She has no axillary adenopathy.       Right: No inguinal and no supraclavicular adenopathy present.       Left: No inguinal and no supraclavicular adenopathy present.  Neurological: She is alert and oriented to person, place, and time.  Skin: Skin is warm and dry. No rash noted. She is not diaphoretic. No erythema. No pallor.  Psychiatric: She has a normal mood and affect. Her behavior is normal. Judgment and thought content normal.  Nursing note and vitals reviewed.   Infusion on 07/29/2019  Component Date Value Ref Range Status   Sodium 07/29/2019 133* 135 - 145 mmol/L Final   Potassium 07/29/2019 4.2  3.5 - 5.1 mmol/L Final   Chloride 07/29/2019 99  98 - 111 mmol/L Final   CO2 07/29/2019 24  22 - 32 mmol/L Final   Glucose, Bld 07/29/2019 83  70 - 99 mg/dL Final   Glucose reference range applies only to samples taken after fasting for at least 8 hours.   BUN 07/29/2019 15  8 - 23 mg/dL Final   Creatinine, Ser 07/29/2019 0.90  0.44 - 1.00 mg/dL Final   Calcium 07/29/2019 8.9  8.9 - 10.3 mg/dL Final   Total Protein 07/29/2019 6.6  6.5 - 8.1 g/dL Final   Albumin 07/29/2019 3.4* 3.5 - 5.0 g/dL Final   AST 07/29/2019 20  15 - 41 U/L Final   ALT 07/29/2019 15  0 - 44 U/L Final   Alkaline Phosphatase 07/29/2019 85  38 - 126 U/L Final   Total Bilirubin 07/29/2019 0.5  0.3 - 1.2 mg/dL Final   GFR calc non Af Amer 07/29/2019 >60  >60 mL/min Final   GFR calc Af Amer 07/29/2019 >60  >60 mL/min Final   Anion gap 07/29/2019 10  5 - 15 Final   Performed at [facility], [address], [place], [place] [address]   WBC 07/29/2019 7.7  4.0 -  10.5 K/uL Final   RBC 07/29/2019 4.40  3.87 - 5.11 MIL/uL Final   Hemoglobin 07/29/2019 12.4  12.0 - 15.0 g/dL Final   HCT 07/29/2019 39.6  36.0 - 46.0 % Final   MCV 07/29/2019 90.0  80.0 - 100.0 fL Final   MCH 07/29/2019 28.2  26.0 - 34.0 pg Final   MCHC 07/29/2019 31.3  30.0 - 36.0 g/dL Final   RDW 07/29/2019 13.7  11.5 - 15.5 % Final   Platelets 07/29/2019 293  150 - 400 K/uL Final   nRBC 07/29/2019 0.0  0.0 - 0.2 % Final   Neutrophils Relative % 07/29/2019 71  % Final   Neutro Abs 07/29/2019 5.4  1.7 - 7.7 K/uL Final   Lymphocytes Relative 07/29/2019 20  % Final   Lymphs Abs 07/29/2019 1.6  0.7 - 4.0 K/uL Final   Monocytes Relative 07/29/2019 8  % Final   Monocytes Absolute 07/29/2019 0.6  0.1 - 1.0 K/uL Final   Eosinophils Relative 07/29/2019 1  % Final   Eosinophils Absolute 07/29/2019 0.1  0.0 - 0.5 K/uL Final   Basophils Relative 07/29/2019 0  % Final   Basophils Absolute 07/29/2019 0.0  0.0 - 0.1 K/uL Final   Immature Granulocytes 07/29/2019 0  % Final   Abs Immature Granulocytes 07/29/2019 0.03  0.00 - 0.07 K/uL Final   Performed at [facility], [address], [place], [place] [address]    Assessment:  Roberta [name] is a 74 y.o. female with limited stage small cell lung cancer. She had stage IIIB (T3N2M0) disease. She received cisplatin and etoposidefrom 03/05/2017 - 05/14/2017. She received radiationfrom 03/08/2017 - 04/23/2017.  Chest, abdomen, and pelvic CTon 05/30/2017 revealed continued improvement in the right upper lobe mass, right hilar and mediastinal lymphadenopathy consistent with response to therapy. there was interval development of multiple small ill-defined peribronchovascular nodules in both lungs, greatest in the right upper lobe. Some of these were clustered, and were not associated with the bronchovascular bundles such that lymphangitic  tumor was unlikely. These were likely inflammatory. There was no evidence of metastatic disease in the abdomen or pelvis  She received PCIfrom 06/19/2017 - 07/11/2017.  Chest, abdomen, and pelvic CTon 06/07/2018 revealed interval development of L4 vertebral body burst fracturewith mild retropulsion of the posterior aspect of the vertebral body. There was associated sclerosis of the L4 vertebral body with significant height loss predominately involving the anterior and mid aspect of the vertebral body. Possibility of pathologic fracture was not excluded. There was a nondisplacedright L4 transverse process fracture. Recommend further evaluation with lumbar spine MRI. There were findings suggestive of mild acute sigmoid colonic diverticulitis. There was similar to mild interval increase in size of previously described lesion within the right upper lobe (3.1 x 1.8 cm to 3.2 x 2.2 cm). Adjacent irregular nodulealong the inferior margin within the right upper lobe was grossly similar when compared to prior (1.9 x 1.1 cm to 1.2 x 2.0 cm). Additional areas of consolidation within the right lung were grossly similar. There was interval improvement in previously described tree-in-bud nodularity within the left lower lobe with slight interval increase in ground-glass nodularity within the right lower lobe suggestive of atypical infectious process (MAI).  PETscanon03/16/2020 revealed a 3.1 x 3.2 focus area of opacity in the RIGHTupper lobe (SUV 2.0). Additionally, there was a 1.9 x 2.2 cm perihilar RIGHTlung lesion extending into the superior aspect of the RIGHThilum (SUV 1.8). Both areas considered to be infectious/inflammatory, however low-grade neoplasm cannot  be excluded. Short-term follow-up recommended.  ChestCTon 11/12/2018 revealed expected evolution of post radiation pneumonitis/fibrosis in the right suprahilar region.There was no evidence of local recurrence or metastatic  disease.There was no adenopathy. There was aorticatherosclerosis.  Chest, abdomen, and pelvis CTon 05/05/2019 revealed evolving postradiation mass-like fibrosis in the upper right lung, similar to the prior study.There was slight enlargement of small right pleural effusion.There were no definitive findings to suggest locally recurrent disease or metastatic disease in the chest, abdomen or pelvis.There was a new compression fractureof superior endplate of [id number] 62% loss of anterior vertebral body height, favored to be a benign compression fracture. There was severe colonic diverticulosis without evidence of acute diverticulitis, aortic atherosclerosis, in addition to left main and 3 vessel coronary artery disease. Therewere calcifications of the aortic valve and mitral annulus.There was diffuse bronchial wall thickening with very mild centrilobular and mild paraseptal emphysema; imaging findings suggestive of underlying COPD.  She underwent kyphoplasty of L4with radiofrequency ablation on 06/11/2018. Pathologyrevealed viable bone and trilineage metabolic marrow with reactive changes and focal changes of fracture callus. IHC stains for pancytokeratin were negative. There was no evidence of metastatic disease.  Bone density on 05/21/2019 revealed osteoporosis with a T-score of -3.0 in the left femoral hip and -2.4 in the left forearm radius.  Bone density on 05/21/2019 showed osteoporosis with a T-score of -3.0 in the total left femur.   She has a history of left breast cancerin 2002. She underwent left mastectomy followed by adjuvant chemotherapy. She was treated by Dr. Oliva Bustard. She did not receive adjuvant radiation.  Symptomatically, she is doing well.  She denies any shortness of breath.  She has lost 6 pounds.  Exam is stable.  Plan: 1.   Labs today: CBC with diff, CMP. 2.Limited stage SCLC Clinically, she appears to be doing well.  We discussed concern for weight loss.    Chest, abdomen, and pelvis CT on 05/05/2019 revealed no evidence of recurrent disease. She has a new T5 compression fracture. Discuss follow-up imaging in 10/2019. She is smoking a half a pack a day.    Encourage smoking cessation.  3.L4 burst fractures/pkyphoplasty She has chronic back pain.           She has sacroiliitis per orthopedics (12/12/2018 note).Marland Kitchen 4.T5 superior endplate compression fracture She remains asymptomatic. She has osteoporosis.                         Encourage calcium and vitamin D.                         Consider Prolia. 5.   Weight loss  Discuss concern for weight loss.  She declined nutrition consultation  Patient provided for Ensure and boost.  Discuss weight checks monthly.  If eight continues to decline, discuss early imaging studies. 6. Port-a-cath maintenance Port flush every 6-12 weeks. 7.   Schedule chest CT on 11/03/2019. 8.   RTC after chest CT for MD assessment, labs (CBC with diff, CMP, CEA-can be drawn before CT), and review of imaging.  I discussed the assessment and treatment plan with the patient.  The patient was provided an opportunity to ask questions and all were answered.  The patient agreed with the plan and demonstrated an understanding of the instructions.  The patient was advised to call back if the symptoms worsen or if the condition fails to improve as anticipated.   Lequita Asal, MD, PhD  07/29/2019, 11:31 AM  I, Selena Batten, am acting as scribe for [employer]. Mike Gip, MD, PhD.  I, Jacqulynn Shappell C. Mike Gip, MD, have reviewed the above documentation for accuracy and completeness, and I agree with the above.

## 2019-07-29 ENCOUNTER — Inpatient Hospital Stay: Payer: Medicare HMO

## 2019-07-29 ENCOUNTER — Encounter: Payer: Self-pay | Admitting: Hematology and Oncology

## 2019-07-29 ENCOUNTER — Other Ambulatory Visit: Payer: Self-pay

## 2019-07-29 ENCOUNTER — Inpatient Hospital Stay: Payer: Medicare HMO | Attending: Hematology and Oncology | Admitting: Hematology and Oncology

## 2019-07-29 VITALS — BP 123/64 | HR 83 | Temp 98.0°F | Resp 19 | Wt 123.0 lb

## 2019-07-29 DIAGNOSIS — J449 Chronic obstructive pulmonary disease, unspecified: Secondary | ICD-10-CM | POA: Diagnosis not present

## 2019-07-29 DIAGNOSIS — Z923 Personal history of irradiation: Secondary | ICD-10-CM | POA: Diagnosis not present

## 2019-07-29 DIAGNOSIS — S22050S Wedge compression fracture of T5-T6 vertebra, sequela: Secondary | ICD-10-CM | POA: Insufficient documentation

## 2019-07-29 DIAGNOSIS — F17210 Nicotine dependence, cigarettes, uncomplicated: Secondary | ICD-10-CM | POA: Diagnosis not present

## 2019-07-29 DIAGNOSIS — E785 Hyperlipidemia, unspecified: Secondary | ICD-10-CM

## 2019-07-29 DIAGNOSIS — Z7982 Long term (current) use of aspirin: Secondary | ICD-10-CM | POA: Diagnosis not present

## 2019-07-29 DIAGNOSIS — M810 Age-related osteoporosis without current pathological fracture: Secondary | ICD-10-CM | POA: Diagnosis not present

## 2019-07-29 DIAGNOSIS — Z9221 Personal history of antineoplastic chemotherapy: Secondary | ICD-10-CM | POA: Insufficient documentation

## 2019-07-29 DIAGNOSIS — Z79899 Other long term (current) drug therapy: Secondary | ICD-10-CM | POA: Diagnosis not present

## 2019-07-29 DIAGNOSIS — R634 Abnormal weight loss: Secondary | ICD-10-CM | POA: Insufficient documentation

## 2019-07-29 DIAGNOSIS — Z95828 Presence of other vascular implants and grafts: Secondary | ICD-10-CM

## 2019-07-29 DIAGNOSIS — M549 Dorsalgia, unspecified: Secondary | ICD-10-CM | POA: Diagnosis not present

## 2019-07-29 DIAGNOSIS — H9190 Unspecified hearing loss, unspecified ear: Secondary | ICD-10-CM | POA: Diagnosis not present

## 2019-07-29 DIAGNOSIS — Z853 Personal history of malignant neoplasm of breast: Secondary | ICD-10-CM | POA: Diagnosis not present

## 2019-07-29 DIAGNOSIS — C3411 Malignant neoplasm of upper lobe, right bronchus or lung: Secondary | ICD-10-CM

## 2019-07-29 LAB — COMPREHENSIVE METABOLIC PANEL
ALT: 15 U/L (ref 0–44)
AST: 20 U/L (ref 15–41)
Albumin: 3.4 g/dL — ABNORMAL LOW (ref 3.5–5.0)
Alkaline Phosphatase: 85 U/L (ref 38–126)
Anion gap: 10 (ref 5–15)
BUN: 15 mg/dL (ref 8–23)
CO2: 24 mmol/L (ref 22–32)
Calcium: 8.9 mg/dL (ref 8.9–10.3)
Chloride: 99 mmol/L (ref 98–111)
Creatinine, Ser: 0.9 mg/dL (ref 0.44–1.00)
GFR calc Af Amer: >60 mL/min (ref >60)
GFR calc non Af Amer: >60 mL/min (ref >60)
Glucose, Bld: 83 mg/dL (ref 70–99)
Potassium: 4.2 mmol/L (ref 3.5–5.1)
Sodium: 133 mmol/L — ABNORMAL LOW (ref 135–145)
Total Bilirubin: 0.5 mg/dL (ref 0.3–1.2)
Total Protein: 6.6 g/dL (ref 6.5–8.1)

## 2019-07-29 LAB — CBC WITH DIFFERENTIAL/PLATELET
Abs Immature Granulocytes: 0.03 10*3/uL (ref 0.00–0.07)
Basophils Absolute: 0 10*3/uL (ref 0.0–0.1)
Basophils Relative: 0 %
Eosinophils Absolute: 0.1 10*3/uL (ref 0.0–0.5)
Eosinophils Relative: 1 %
HCT: 39.6 % (ref 36.0–46.0)
Hemoglobin: 12.4 g/dL (ref 12.0–15.0)
Immature Granulocytes: 0 %
Lymphocytes Relative: 20 %
Lymphs Abs: 1.6 10*3/uL (ref 0.7–4.0)
MCH: 28.2 pg (ref 26.0–34.0)
MCHC: 31.3 g/dL (ref 30.0–36.0)
MCV: 90 fL (ref 80.0–100.0)
Monocytes Absolute: 0.6 10*3/uL (ref 0.1–1.0)
Monocytes Relative: 8 %
Neutro Abs: 5.4 10*3/uL (ref 1.7–7.7)
Neutrophils Relative %: 71 %
Platelets: 293 10*3/uL (ref 150–400)
RBC: 4.4 MIL/uL (ref 3.87–5.11)
RDW: 13.7 % (ref 11.5–15.5)
WBC: 7.7 10*3/uL (ref 4.0–10.5)
nRBC: 0 % (ref 0.0–0.2)

## 2019-07-29 MED ORDER — HEPARIN SOD (PORK) LOCK FLUSH 100 UNIT/ML IV SOLN
500.0000 [IU] | Freq: Once | INTRAVENOUS | Status: AC
  Administered 2019-07-29: 11:00:00 500 [IU] via INTRAVENOUS
  Filled 2019-07-29: qty 5

## 2019-07-29 MED ORDER — SODIUM CHLORIDE 0.9% FLUSH
10.0000 mL | INTRAVENOUS | Status: DC | PRN
  Administered 2019-07-29: 11:00:00 10 mL via INTRAVENOUS
  Filled 2019-07-29: qty 10

## 2019-07-29 NOTE — Progress Notes (Signed)
Patient has no questions or concerns today.

## 2019-07-31 LAB — CANCER ANTIGEN 27.29: CA 27.29: 16 U/mL (ref 0.0–38.6)

## 2019-09-20 ENCOUNTER — Other Ambulatory Visit: Payer: Self-pay

## 2019-09-20 ENCOUNTER — Emergency Department
Admission: EM | Admit: 2019-09-20 | Discharge: 2019-09-20 | Disposition: A | Discharge status: Home / Self Care | Payer: Medicare HMO | Attending: Emergency Medicine | Admitting: Emergency Medicine

## 2019-09-20 ENCOUNTER — Emergency Department: Payer: Medicare HMO

## 2019-09-20 DIAGNOSIS — Z7982 Long term (current) use of aspirin: Secondary | ICD-10-CM | POA: Insufficient documentation

## 2019-09-20 DIAGNOSIS — Z79899 Other long term (current) drug therapy: Secondary | ICD-10-CM | POA: Insufficient documentation

## 2019-09-20 DIAGNOSIS — J449 Chronic obstructive pulmonary disease, unspecified: Secondary | ICD-10-CM | POA: Diagnosis not present

## 2019-09-20 DIAGNOSIS — F17210 Nicotine dependence, cigarettes, uncomplicated: Secondary | ICD-10-CM | POA: Diagnosis not present

## 2019-09-20 DIAGNOSIS — R0781 Pleurodynia: Secondary | ICD-10-CM

## 2019-09-20 DIAGNOSIS — R0789 Other chest pain: Secondary | ICD-10-CM | POA: Diagnosis present

## 2019-09-20 LAB — BASIC METABOLIC PANEL
Anion gap: 8 (ref 5–15)
BUN: 16 mg/dL (ref 8–23)
CO2: 29 mmol/L (ref 22–32)
Calcium: 9.1 mg/dL (ref 8.9–10.3)
Chloride: 102 mmol/L (ref 98–111)
Creatinine, Ser: 0.81 mg/dL (ref 0.44–1.00)
GFR calc Af Amer: >60 mL/min (ref >60)
GFR calc non Af Amer: >60 mL/min (ref >60)
Glucose, Bld: 103 mg/dL — ABNORMAL HIGH (ref 70–99)
Potassium: 4.4 mmol/L (ref 3.5–5.1)
Sodium: 139 mmol/L (ref 135–145)

## 2019-09-20 LAB — CBC WITH DIFFERENTIAL/PLATELET
Abs Immature Granulocytes: 0.03 10*3/uL (ref 0.00–0.07)
Basophils Absolute: 0 10*3/uL (ref 0.0–0.1)
Basophils Relative: 1 %
Eosinophils Absolute: 0.1 10*3/uL (ref 0.0–0.5)
Eosinophils Relative: 2 %
HCT: 40.2 % (ref 36.0–46.0)
Hemoglobin: 12.7 g/dL (ref 12.0–15.0)
Immature Granulocytes: 0 %
Lymphocytes Relative: 14 %
Lymphs Abs: 1.2 10*3/uL (ref 0.7–4.0)
MCH: 28.2 pg (ref 26.0–34.0)
MCHC: 31.6 g/dL (ref 30.0–36.0)
MCV: 89.3 fL (ref 80.0–100.0)
Monocytes Absolute: 0.6 10*3/uL (ref 0.1–1.0)
Monocytes Relative: 7 %
Neutro Abs: 6.2 10*3/uL (ref 1.7–7.7)
Neutrophils Relative %: 76 %
Platelets: 285 10*3/uL (ref 150–400)
RBC: 4.5 MIL/uL (ref 3.87–5.11)
RDW: 14.7 % (ref 11.5–15.5)
WBC: 8.2 10*3/uL (ref 4.0–10.5)
nRBC: 0 % (ref 0.0–0.2)

## 2019-09-20 LAB — TROPONIN I (HIGH SENSITIVITY)
Troponin I (High Sensitivity): 5 ng/L (ref <18)
Troponin I (High Sensitivity): 6 ng/L (ref <18)

## 2019-09-20 MED ORDER — OXYCODONE HCL 5 MG PO TABS
5.0000 mg | ORAL_TABLET | Freq: Once | ORAL | Status: AC
  Administered 2019-09-20: 13:00:00 5 mg via ORAL
  Filled 2019-09-20: qty 1

## 2019-09-20 MED ORDER — IOHEXOL 350 MG/ML SOLN
75.0000 mL | Freq: Once | INTRAVENOUS | Status: AC | PRN
  Administered 2019-09-20: 75 mL via INTRAVENOUS

## 2019-09-20 MED ORDER — TRAMADOL HCL 50 MG PO TABS: 50.0000 mg | ORAL_TABLET | Freq: Four times a day (QID) | ORAL | 0 refills | Status: DC | PRN

## 2019-09-20 NOTE — ED Provider Notes (Signed)
[facility] Emergency Department Provider Note  ____________________________________________   First MD Initiated Contact with Patient 09/20/19 1228     (approximate)  I have reviewed the triage vital signs and the nursing notes.   HISTORY  Chief Complaint Chest Pain and arm pain   HPI Roberta [name] is a 74 y.o. female with history of AAA, breast cancer, COPD, small cell lung cancer presenting to the emergency department for evaluation of left upper rib pain and side pain that started this week and has progressively worsened.  She continues to smoke half a pack of cigarettes per day.  No recent injury.  No nausea, vomiting, diarrhea.   No relief with Advil.  Past Medical History:  Diagnosis Date  . AAA (abdominal aortic aneurysm) ([place])   . Anemia   . Breast cancer ([place]) 2002   left  . COPD (chronic obstructive pulmonary disease) ([place])   . Dyspnea   . Headache   . High cholesterol   . Personal history of chemotherapy   . Small cell lung cancer ([place])   . Tobacco abuse     Patient Active Problem List   Diagnosis Date Noted  . Weight loss 07/29/2019  . Age-related osteoporosis without current pathological fracture 05/21/2019  . Compression fracture of T5 vertebra ([place]) 05/06/2019  . Port-A-Cath in place 05/06/2019  . Closed stable burst fracture of fourth lumbar vertebra ([place]) 06/08/2018  . Severe protein-calorie malnutrition ([place]) 10/29/2017  . Sepsis ([place]) 04/28/2017  . Small cell lung cancer, right upper lobe ([place]) 02/23/2017  . Goals of care, counseling/discussion 02/23/2017  . Lung mass   . AAA (abdominal aortic aneurysm) without rupture ([place]) 11/02/2016  . COPD mixed type ([place]) 11/02/2016  . Hyperlipidemia 11/02/2016  . PAD (peripheral artery disease) ([place]) 11/02/2016    Past Surgical History:  Procedure Laterality Date  . ABDOMINAL HYSTERECTOMY    . ARTERY BIOPSY Right 12/05/2017   Procedure: BIOPSY TEMPORAL ARTERY;  Surgeon:  Margaretha Sheffield, MD;  Location: [facility] ORS;  Service: ENT;  Laterality: Right;  . ENDOBRONCHIAL ULTRASOUND N/A 02/19/2017   Procedure: ENDOBRONCHIAL ULTRASOUND;  Surgeon: Flora Lipps, MD;  Location: [facility] ORS;  Service: Cardiopulmonary;  Laterality: N/A;  . ENDOVASCULAR REPAIR/STENT GRAFT N/A 12/06/2016   Procedure: Endovascular Repair/Stent Graft;  Surgeon: Katha Cabal, MD;  Location: [facility];  Service: Cardiovascular;  Laterality: N/A;  . IR FLUORO GUIDE PORT INSERTION RIGHT  03/02/2017  . KYPHOPLASTY N/A 06/11/2018   Procedure: KYPHOPLASTY L4 BIOPSY WITH RFA;  Surgeon: Hessie Knows, MD;  Location: [facility] ORS;  Service: Orthopedics;  Laterality: N/A;  . MASTECTOMY Left 2002   with sentinel node     Prior to Admission medications   Medication Sig Start Date End Date Taking? Authorizing Provider  acetaminophen (TYLENOL) 325 MG tablet Take 650 mg by mouth every 6 (six) hours as needed for mild pain.     [provider]  albuterol (2.5 MG/3ML) 0.083% NEBU 3 mL, albuterol (5 MG/ML) 0.5% NEBU 0.5 mL Inhale 1 mg into the lungs.    [provider]  albuterol (PROVENTIL HFA;VENTOLIN HFA) 108 (90 Base) MCG/ACT inhaler Inhale 2 puffs into the lungs every 6 (six) hours as needed for wheezing or shortness of breath. 03/23/16   Frederich Cha, MD  aspirin EC 81 MG tablet Take 81 mg by mouth daily.    [provider]  atorvastatin (LIPITOR) 10 MG tablet Take 10 mg by mouth at bedtime.  08/11/16   [provider]  fluticasone-salmeterol (ADVAIR HFA) 115-21 MCG/ACT inhaler Inhale 2 puffs into the lungs 2 (two) times daily.    [provider]  ipratropium (ATROVENT) 0.02 % nebulizer solution Inhale into the lungs. 01/11/17 07/07/20  [provider]  lidocaine-prilocaine (EMLA) cream Apply 1 application topically as needed.    [provider]  loratadine (CLARITIN) 10 MG tablet Take 1 tablet (10 mg total) by mouth daily. Patient not taking:  Reported on 07/29/2019 04/04/19   Lequita Asal, MD  Multiple Vitamin (MULTIVITAMIN) tablet Take 1 tablet by mouth daily.    [provider]  traMADol (ULTRAM) 50 MG tablet Take 1 tablet (50 mg total) by mouth every 6 (six) hours as needed. 09/20/19   Victorino Dike, FNP    Allergies Patient has no known allergies.  Family History  Problem Relation Age of Onset  . CAD Mother   . Colon cancer Brother   . Breast cancer Neg Hx     Social History Social History   Tobacco Use  . Smoking status: Current Every Day Smoker    Packs/day: 0.50    Years: 50.00    Pack years: 25.00    Types: Cigarettes  . Smokeless tobacco: Never Used  Substance Use Topics  . Alcohol use: No  . Drug use: No    Review of Systems  Constitutional: No fever/chills. Eyes: No visual changes. ENT: No sore throat. Cardiovascular: Positive for chest pain.  Positive for pleuritic pain.  Negative for palpitations.  Negative for leg pain. Respiratory: Negative shortness of breath. Gastrointestinal: Negative for abdominal pain.  No nausea, no vomiting.  No diarrhea.  No constipation. Genitourinary: Negative for dysuria. Musculoskeletal: Positive for back pain.  Skin: Negative for rash, lesion, wound. Neurological: Negative for headaches, focal weakness or numbness.  ____________________________________________   PHYSICAL EXAM:  VITAL SIGNS: ED Triage Vitals  Enc Vitals Group     BP 09/20/19 1220 140/83     Pulse Rate 09/20/19 1220 (!) 108     Resp 09/20/19 1220 19     Temp 09/20/19 1220 (!) 97.4 F (36.3 C)     Temp src --      SpO2 09/20/19 1220 94 %     Weight 09/20/19 1213 125 lb (56.7 kg)     Height 09/20/19 1213 5\' 7"  (1.702 m)     Head Circumference --      Peak Flow --      Pain Score 09/20/19 1213 10     Pain Loc --      Pain Edu? --      Excl. in [place]? --     Constitutional: Alert and oriented.  Chronically ill appearing and in no acute distress.  Normal mental  status. Eyes: Conjunctivae are normal. PERRL. Head: Atraumatic. Nose: No congestion/rhinnorhea. Mouth/Throat: Mucous membranes are moist.  Oropharynx non-erythematous. Tongue normal in size and color. Neck: No stridor.  No carotid bruit appreciated on exam. Hematological/Lymphatic/Immunilogical: No cervical lymphadenopathy. Cardiovascular: Normal rate, regular rhythm. Grossly normal heart sounds.  Good peripheral circulation. Respiratory: Normal respiratory effort.  No retractions. Lungs diminished in the left lung base, clear otherwise. Gastrointestinal: Soft and nontender. No distention. No abdominal bruits. No CVA tenderness. Genitourinary: Exam deferred. Musculoskeletal: No lower extremity tenderness.  No edema of extremities. Neurologic:  Normal speech and language. No gross focal neurologic deficits are appreciated. Skin:  Skin is warm, dry and intact. No rash noted. Psychiatric: Mood and affect are normal. Speech and behavior are normal.  ____________________________________________   LABS (all labs ordered are listed, but only abnormal results are displayed)  Labs Reviewed  BASIC METABOLIC PANEL - Abnormal; Notable for the following components:      Result Value   Glucose, Bld 103 (*)    All other components within normal limits  CBC WITH DIFFERENTIAL/PLATELET  TROPONIN I (HIGH SENSITIVITY)  TROPONIN I (HIGH SENSITIVITY)   ____________________________________________  EKG  ED ECG REPORT I, Oliviah Agostini, FNP-BC personally viewed and interpreted this ECG.   Date: 09/20/2019  EKG Time: 12:11 PM  Rate: 107  Rhythm: sinus tachycardia  Axis: normal  Intervals:none  ST&T Change: no ST elevation or new LBBB  ____________________________________________  RADIOLOGY   ED MD interpretation:  Chest x-ray without acute concerns.  I, Sherrie George, personally viewed and evaluated these images (plain radiographs) as part of my medical decision making, as well as reviewing  the written report by the radiologist.  Official radiology report(s): DG Chest 1 View  Result Date: 09/20/2019 CLINICAL DATA:  Left-sided chest pain EXAM: CHEST  1 VIEW COMPARISON:  05/05/2019 FINDINGS: The heart size and mediastinal contours are within normal limits. Left chest port catheter. Redemonstrated bandlike scarring and volume loss of the right upper lobe. The visualized skeletal structures are unremarkable. IMPRESSION: Redemonstrated bandlike scarring and volume loss of the right upper lobe. No acute abnormality of the lungs. Electronically Signed   By: Eddie Candle M.D.   On: 09/20/2019 13:42   CT Angio Chest PE W and/or Wo Contrast  Result Date: 09/20/2019 CLINICAL DATA:  74 year old female with acute LEFT-sided chest pain. History of small cell lung cancer treated with chemotherapy and radiation, and LEFT breast cancer and mastectomy. EXAM: CT ANGIOGRAPHY CHEST WITH CONTRAST TECHNIQUE: Multidetector CT imaging of the chest was performed using the standard protocol during bolus administration of intravenous contrast. Multiplanar CT image reconstructions and MIPs were obtained to evaluate the vascular anatomy. CONTRAST:  72mL OMNIPAQUE IOHEXOL 350 MG/ML SOLN COMPARISON:  05/05/2019 CT and prior studies FINDINGS: Cardiovascular: Satisfactory opacification of the pulmonary arteries to the segmental level. No evidence of pulmonary embolism. Normal heart size. No pericardial effusion. Aortic atherosclerotic calcification noted without evidence of thoracic aortic aneurysm. Coronary artery atherosclerotic calcifications again noted. A LEFT IJ Port-A-Cath is again noted. Mediastinum/Nodes: No enlarged mediastinal, hilar, or axillary lymph nodes. Thyroid gland, trachea, and esophagus demonstrate no significant findings. Lungs/Pleura: Masslike areas of architectural distortion within the RIGHT UPPER lobe and SUPERIOR segment RIGHT LOWER lobe have minimally increased, most likely representing post  radiation changes/fibrosis. Other scattered pleuroparenchymal opacities within both UPPER lungs noted. No pleural effusion or pneumothorax. Upper Abdomen: No acute abnormality. For extent graft again noted. Musculoskeletal: No acute or suspicious bony abnormalities. T5 SUPERIOR endplate compression fracture is again noted. LEFT mastectomy again noted. Review of the MIP images confirms the above findings. IMPRESSION: 1. No evidence of acute abnormality. No evidence of pulmonary emboli. 2. Minimally increased masslike areas of architectural distortion within the RIGHT UPPER lobe and SUPERIOR segment RIGHT LOWER lobe - most likely representing post radiation changes/fibrosis. 3. Coronary artery disease. 4. Aortic Atherosclerosis (ICD10-I70.0). Electronically Signed   By: Margarette Canada M.D.   On: 09/20/2019 15:05    ____________________________________________   PROCEDURES  Procedure(s) performed: None  Procedures  Critical Care performed: No  ____________________________________________   INITIAL IMPRESSION / ASSESSMENT AND PLAN / ED COURSE  As part of my medical decision making, I reviewed the following data within the electronic MEDICAL RECORD NUMBER Notes from prior ED  visits  74 year old female presenting to the emergency department after 1 week of pleuritic type left-sided chest and back pain that has progressively worsened over the past 24 hours and not relieved with Advil.  She does have a significant medical history of small cell lung CA for which she received chemotherapy and radiation.  She states that she has been in remission for the past year and is scheduled for a routine follow-up in approximately 2 months.  Pain increases with movement, cough, sneezing or deep breath.  She denies other symptoms of concern such as fever, nausea, vomiting, diarrhea.  She does not have any known COVID-19 exposures that she is aware of.  She has had 2 COVID-19 vaccinations.  Plan will be to perform a cardiac  work-up including labs and x-ray.  Give all appears reassuring, will perform CTA chest to rule out PE and/or progressing lung CA. patient aware and agrees to the plan.  Serial troponins, chest CT a and x-ray are all reassuring. She will be discharged home to follow up with primary care/cardiology. She is to return to the ER for symptoms of concern if unable to schedule an appointment. ____________________________________________  Differential diagnosis includes, but not limited to:     FINAL CLINICAL IMPRESSION(S) / ED DIAGNOSES  Final diagnoses:  Pleuritic chest pain     ED Discharge Orders         Ordered    traMADol (ULTRAM) 50 MG tablet  Every 6 hours PRN     09/20/19 1728           Roberta [name] was evaluated in Emergency Department on 09/20/2019 for the symptoms described in the history of present illness. She was evaluated in the context of the global COVID-19 pandemic, which necessitated consideration that the patient might be at risk for infection with the SARS-CoV-2 virus that causes COVID-19. Institutional protocols and algorithms that pertain to the evaluation of patients at risk for COVID-19 are in a state of rapid change based on information released by regulatory bodies including the CDC and federal and state organizations. These policies and algorithms were followed during the patient's care in the ED.   Note:  This document was prepared using Dragon voice recognition software and may include unintentional dictation errors.   Victorino Dike, FNP 09/20/19 2050    Arta Silence, MD 09/21/19 [phone]

## 2019-09-20 NOTE — ED Triage Notes (Signed)
Pt comes via POV from home with c/o left upper rib pain and side pain. Pt states it started this week and has gotten worse.  Pt denies any chest pain. Pt denies any recent injuries. Pt denies any N/VD. Pt denies any urinary symptoms.

## 2019-09-20 NOTE — ED Notes (Signed)
First Nurse Note: Pt to ED c/o left sided chest pain, left arm pain, and rib pain

## 2019-09-20 NOTE — ED Notes (Signed)
Pt provided with coca cola and saltine crackers. Given ok by MD. Pt assisted to bathroom at this time

## 2019-09-20 NOTE — ED Notes (Signed)
RN Dorian Pod called and informed of pt coming to room. Also informed that no blood was collected yet.  Pt placed on monitor.

## 2019-09-20 NOTE — Discharge Instructions (Addendum)
Please follow up with your primary care provider for symptoms that are not improving over the next few days.  Return to the ER for symptoms that change or worsen if unable to schedule an appointment.

## 2019-11-03 ENCOUNTER — Telehealth: Payer: Self-pay | Admitting: *Deleted

## 2019-11-03 ENCOUNTER — Ambulatory Visit
Admission: RE | Admit: 2019-11-03 | Discharge: 2019-11-03 | Disposition: A | Discharge status: Home / Self Care | Payer: Medicare HMO | Source: Ambulatory Visit | Attending: Hematology and Oncology | Admitting: Hematology and Oncology

## 2019-11-03 ENCOUNTER — Other Ambulatory Visit: Payer: Self-pay

## 2019-11-03 ENCOUNTER — Inpatient Hospital Stay: Payer: Medicare HMO | Attending: Hematology and Oncology

## 2019-11-03 DIAGNOSIS — C3411 Malignant neoplasm of upper lobe, right bronchus or lung: Secondary | ICD-10-CM | POA: Insufficient documentation

## 2019-11-03 DIAGNOSIS — Z9221 Personal history of antineoplastic chemotherapy: Secondary | ICD-10-CM | POA: Diagnosis not present

## 2019-11-03 DIAGNOSIS — Z800 Family history of malignant neoplasm of digestive organs: Secondary | ICD-10-CM | POA: Diagnosis not present

## 2019-11-03 DIAGNOSIS — M461 Sacroiliitis, not elsewhere classified: Secondary | ICD-10-CM | POA: Insufficient documentation

## 2019-11-03 DIAGNOSIS — F17210 Nicotine dependence, cigarettes, uncomplicated: Secondary | ICD-10-CM | POA: Insufficient documentation

## 2019-11-03 DIAGNOSIS — S22050S Wedge compression fracture of T5-T6 vertebra, sequela: Secondary | ICD-10-CM

## 2019-11-03 DIAGNOSIS — Z923 Personal history of irradiation: Secondary | ICD-10-CM | POA: Insufficient documentation

## 2019-11-03 DIAGNOSIS — G8929 Other chronic pain: Secondary | ICD-10-CM | POA: Diagnosis not present

## 2019-11-03 DIAGNOSIS — M810 Age-related osteoporosis without current pathological fracture: Secondary | ICD-10-CM

## 2019-11-03 DIAGNOSIS — M549 Dorsalgia, unspecified: Secondary | ICD-10-CM | POA: Insufficient documentation

## 2019-11-03 DIAGNOSIS — Z9012 Acquired absence of left breast and nipple: Secondary | ICD-10-CM | POA: Diagnosis not present

## 2019-11-03 DIAGNOSIS — Z79899 Other long term (current) drug therapy: Secondary | ICD-10-CM | POA: Diagnosis not present

## 2019-11-03 DIAGNOSIS — R634 Abnormal weight loss: Secondary | ICD-10-CM

## 2019-11-03 DIAGNOSIS — Z853 Personal history of malignant neoplasm of breast: Secondary | ICD-10-CM | POA: Diagnosis not present

## 2019-11-03 LAB — CBC WITH DIFFERENTIAL/PLATELET
Abs Immature Granulocytes: 0.06 10*3/uL (ref 0.00–0.07)
Basophils Absolute: 0 10*3/uL (ref 0.0–0.1)
Basophils Relative: 0 %
Eosinophils Absolute: 0.1 10*3/uL (ref 0.0–0.5)
Eosinophils Relative: 1 %
HCT: 41.6 % (ref 36.0–46.0)
Hemoglobin: 13 g/dL (ref 12.0–15.0)
Immature Granulocytes: 1 %
Lymphocytes Relative: 21 %
Lymphs Abs: 2 10*3/uL (ref 0.7–4.0)
MCH: 28.1 pg (ref 26.0–34.0)
MCHC: 31.3 g/dL (ref 30.0–36.0)
MCV: 90 fL (ref 80.0–100.0)
Monocytes Absolute: 0.7 10*3/uL (ref 0.1–1.0)
Monocytes Relative: 7 %
Neutro Abs: 6.9 10*3/uL (ref 1.7–7.7)
Neutrophils Relative %: 70 %
Platelets: 287 10*3/uL (ref 150–400)
RBC: 4.62 MIL/uL (ref 3.87–5.11)
RDW: 16.2 % — ABNORMAL HIGH (ref 11.5–15.5)
WBC: 9.9 10*3/uL (ref 4.0–10.5)
nRBC: 0 % (ref 0.0–0.2)

## 2019-11-03 LAB — COMPREHENSIVE METABOLIC PANEL
ALT: 23 U/L (ref 0–44)
AST: 28 U/L (ref 15–41)
Albumin: 3.5 g/dL (ref 3.5–5.0)
Alkaline Phosphatase: 83 U/L (ref 38–126)
Anion gap: 9 (ref 5–15)
BUN: 13 mg/dL (ref 8–23)
CO2: 28 mmol/L (ref 22–32)
Calcium: 9.3 mg/dL (ref 8.9–10.3)
Chloride: 99 mmol/L (ref 98–111)
Creatinine, Ser: 0.83 mg/dL (ref 0.44–1.00)
GFR calc Af Amer: >60 mL/min (ref >60)
GFR calc non Af Amer: >60 mL/min (ref >60)
Glucose, Bld: 102 mg/dL — ABNORMAL HIGH (ref 70–99)
Potassium: 4.1 mmol/L (ref 3.5–5.1)
Sodium: 136 mmol/L (ref 135–145)
Total Bilirubin: 0.6 mg/dL (ref 0.3–1.2)
Total Protein: 7 g/dL (ref 6.5–8.1)

## 2019-11-03 MED ORDER — IOHEXOL 300 MG/ML  SOLN
75.0000 mL | Freq: Once | INTRAMUSCULAR | Status: AC | PRN
  Administered 2019-11-03: 75 mL via INTRAVENOUS

## 2019-11-03 MED ORDER — HEPARIN SOD (PORK) LOCK FLUSH 100 UNIT/ML IV SOLN
500.0000 [IU] | Freq: Once | INTRAVENOUS | Status: AC
  Administered 2019-11-03: 500 [IU] via INTRAVENOUS
  Filled 2019-11-03: qty 5

## 2019-11-03 NOTE — Telephone Encounter (Signed)
Per request of Dr Mike Gip, I have attempted to call patient twice now and do not get an answer tocheck on whether she is having any acute back pain

## 2019-11-03 NOTE — Telephone Encounter (Signed)
Called report IMPRESSION: 1. 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 2. Small RIGHT-sided pleural effusion is slightly larger at approximately 12 mm greatest depth previously approximately 6 mm. 3. Sclerosis of inferior endplate of T6 with slight interval loss of height at this level as compared to the previous study overall approximately 10-20% loss of height. This may represent a subacute fracture given appearance. Correlate with any acute pain in this location 4. Similar appearance of T5 compression fracture. 5. Post LEFT mastectomy. 6. Three-vessel coronary artery disease. 7. Aortic atherosclerosis.  Aortic Atherosclerosis (ICD10-I70.0).   Electronically Signed   By: Zetta Bills M.D.   On: 11/03/2019 12:59

## 2019-11-03 NOTE — Telephone Encounter (Signed)
Attempt made to call pt multiple times, but there are restrictions to patient's phone and call will not go through.

## 2019-11-04 LAB — CEA: CEA: 2.9 ng/mL (ref 0.0–4.7)

## 2019-11-04 NOTE — Progress Notes (Signed)
[facility]  [address], [address] [place], [place] [address] Phone: [phone]  Fax: [phone]   Clinic Day:  11/05/2019  Referring physician: Gayland Curry, MD  Chief Complaint: Roberta [name] is a 74 y.o. female with  limited stage small cell lung cancer who is seen for a 3 month assessment.   HPI: The patient was last seen in the medical oncology clinic on 07/29/2019. At that time, she was doing well. She denied any shortness of breath. She had lost 6 pounds. Exam was stable. Hematocrit was 39.6, hemoglobin 12.4, MCV 90.0, platelets 293,000, WBC 7700, ANC 5400.  She was seen at [facility] ER on 09/20/2019 for chest and arm pain. CXR revealed redemonstrated bandlike scarring and volume loss of the right upper lobe.  Chest CT revealed no evidence of acute abnormality. There was no evidence of pulmonary emboli. There was minimally increased masslike areas of architectural distortion within the RIGHT UPPER lobe and SUPERIOR segment RIGHT LOWER lobe - most likely representing post radiation changes/fibrosis.  Chest CT on 11/03/2019 revealed stable appearance of RIGHT upper lobe post radiation fibrosis and bronchiectatic change with similar appearance of consolidative changes and pleural-parenchymal scarring emanating from the RIGHT hilum extending to the periphery. There was a small RIGHT-sided pleural effusion is slightly larger (12 mm greatest depth previously 6 mm).  There was sclerosis of inferior endplate of T6 with slight interval loss of height at this level as compared to the previous study overall approximately 10-20% loss of height. This may represent a subacute fracture. Correlate with any acute pain in this location. There was a similar appearance of T5 compression fracture. She is s/p LEFT mastectomy.  She has three-vessel coronary artery disease.  She has seen Dr Rudene Christians and Emmit Alexanders, [place] in the [place] [facility] clinic [phone]).  Labs  followed:  09/20/2019: Hematocrit 40.2, hemoglobin 12.7, MCV 89.3, platelets 285,000, WBC 8200, ANC 6200. 11/03/2019: Hematocrit 41.6, hemoglobin 13.0, MCV 90.0, platelets 287,000, WBC 9900, ANC 6900. CEA was 2.9.   During the interim, she has a tingling and burning pain in the left side of her chest. Symptoms began in April. She uses a heating pad up to three times a day to relieve her pain. She denies any rash or fever. Her back hurts once in a while, but it has improved. She denies any shortness of breath. She is coughing and producing a yellow sputum. She is eating better. She is cutting down smoking and is only smoking half a pack a day now. She is unsure whether or not she has taken hydrocodone before. She sometimes uses a walker at home to get around. She is low on her Emla cream and would like a new prescription.    Past Medical History:  Diagnosis Date  . AAA (abdominal aortic aneurysm) ([place])   . Anemia   . Breast cancer ([place]) 2002   left  . COPD (chronic obstructive pulmonary disease) ([place])   . Dyspnea   . Headache   . High cholesterol   . Personal history of chemotherapy   . Small cell lung cancer ([place])   . Tobacco abuse     Past Surgical History:  Procedure Laterality Date  . ABDOMINAL HYSTERECTOMY    . ARTERY BIOPSY Right 12/05/2017   Procedure: BIOPSY TEMPORAL ARTERY;  Surgeon: Margaretha Sheffield, MD;  Location: [facility] ORS;  Service: ENT;  Laterality: Right;  . ENDOBRONCHIAL ULTRASOUND N/A 02/19/2017   Procedure: ENDOBRONCHIAL ULTRASOUND;  Surgeon: Flora Lipps, MD;  Location:  [facility] ORS;  Service: Cardiopulmonary;  Laterality: N/A;  . ENDOVASCULAR REPAIR/STENT GRAFT N/A 12/06/2016   Procedure: Endovascular Repair/Stent Graft;  Surgeon: Katha Cabal, MD;  Location: [place] [facility];  Service: Cardiovascular;  Laterality: N/A;  . IR FLUORO GUIDE PORT INSERTION RIGHT  03/02/2017  . KYPHOPLASTY N/A 06/11/2018   Procedure: KYPHOPLASTY L4 BIOPSY WITH RFA;  Surgeon: Hessie Knows, MD;  Location: [facility] ORS;  Service: Orthopedics;  Laterality: N/A;  . MASTECTOMY Left 2002   with sentinel node     Family History  Problem Relation Age of Onset  . CAD Mother   . Colon cancer Brother   . Breast cancer Neg Hx     Social History:  reports that she has been smoking cigarettes. She has a 25.00 pack-year smoking history. She has never used smokeless tobacco. She reports that she does not drink alcohol or use drugs. 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 The patient's sister's name is [name]. She lives with her daughter. The patient is alone today.  Allergies: No Known Allergies  Current Medications: Current Outpatient Medications  Medication Sig Dispense Refill  . acetaminophen (TYLENOL) 325 MG tablet Take 650 mg by mouth every 6 (six) hours as needed for mild pain.     Marland Kitchen albuterol (2.5 MG/3ML) 0.083% NEBU 3 mL, albuterol (5 MG/ML) 0.5% NEBU 0.5 mL Inhale 1 mg into the lungs.    Marland Kitchen albuterol (PROVENTIL HFA;VENTOLIN HFA) 108 (90 Base) MCG/ACT inhaler Inhale 2 puffs into the lungs every 6 (six) hours as needed for wheezing or shortness of breath. 1 Inhaler 1  . aspirin EC 81 MG tablet Take 81 mg by mouth daily.    Marland Kitchen atorvastatin (LIPITOR) 10 MG tablet Take 10 mg by mouth at bedtime.     . fluticasone-salmeterol (ADVAIR HFA) 115-21 MCG/ACT inhaler Inhale 2 puffs into the lungs 2 (two) times daily.    Marland Kitchen ipratropium (ATROVENT) 0.02 % nebulizer solution Inhale into the lungs.    . lidocaine-prilocaine (EMLA) cream Apply 1 application topically as needed.    . Multiple Vitamin (MULTIVITAMIN) tablet Take 1 tablet by mouth daily.    Marland Kitchen loratadine (CLARITIN) 10 MG tablet Take 1 tablet (10 mg total) by mouth daily. (Patient not taking: Reported on 07/29/2019) 30 tablet 1  . traMADol (ULTRAM) 50 MG tablet Take 1 tablet (50 mg total) by mouth  every 6 (six) hours as needed. (Patient not taking: Reported on 11/05/2019) 12 tablet 0   No current facility-administered medications for this visit.   Facility-Administered Medications Ordered in Other Visits  Medication Dose Route Frequency Provider Last Rate Last Admin  . sodium chloride flush (NS) 0.9 % injection 10 mL  10 mL Intravenous PRN Sindy Guadeloupe, MD   10 mL at 03/26/17 0948  . sodium chloride flush (NS) 0.9 % injection 10 mL  10 mL Intravenous PRN Sindy Guadeloupe, MD   10 mL at 04/16/17 1006  . sodium chloride flush (NS) 0.9 % injection 10 mL  10 mL Intravenous PRN Sindy Guadeloupe, MD   10 mL at 01/10/18 1410  . sodium chloride flush (NS) 0.9 % injection 10 mL  10 mL Intravenous PRN Lequita Asal, MD   10 mL at 12/11/18 1033    Review of Systems  Constitutional: Negative for chills, diaphoresis, fever,  malaise/fatigue and weight loss (up 7 lb).       Doing ok.  HENT: Positive for hearing loss. Negative for congestion, ear pain, nosebleeds, sinus pain and sore throat.   Eyes: Negative.  Negative for blurred vision, double vision and photophobia.  Respiratory: Positive for cough (chronic) and sputum production (yellow ). Negative for hemoptysis and shortness of breath.        COPD.  Smokes half a pack a day.  Cardiovascular: Negative.  Negative for chest pain, palpitations, orthopnea, leg swelling and PND.  Gastrointestinal: Negative.  Negative for abdominal pain, blood in stool, constipation, diarrhea, heartburn, melena, nausea and vomiting.       Eating well. Appetite better.  Genitourinary: Negative.  Negative for dysuria, frequency, hematuria and urgency.  Musculoskeletal: Negative for back pain (lower back; s/p kyphoplasty; better), falls, joint pain and myalgias.  Skin: Negative.  Negative for itching and rash.  Neurological: Negative.  Negative for dizziness, tingling, tremors, sensory change, speech change, focal weakness, weakness and headaches.   Endo/Heme/Allergies: Negative.  Negative for environmental allergies. Does not bruise/bleed easily.  Psychiatric/Behavioral: Negative for depression and memory loss. The patient is not nervous/anxious and does not have insomnia.   All other systems reviewed and are negative.  Performance status (ECOG): 1 - Symptomatic but completely ambulatory  Vitals Blood pressure 115/63, pulse 91, temperature (!) 95.5 F (35.3 C), temperature source Tympanic, resp. rate 16, weight 130 lb 10 oz (59.2 kg), SpO2 97 %.   Physical Exam  Constitutional: She is oriented to person, place, and time. She appears well-developed and well-nourished. No distress.  Thin woman sitting comfortably in the exam room in no acute distress.  HENT:  Head: Normocephalic and atraumatic.  Right Ear: External ear normal.  Mouth/Throat: Oropharynx is clear and moist. No oropharyngeal exudate.  Short brown hair. Mask.  Eyes: Pupils are equal, round, and reactive to light. Conjunctivae and EOM are normal. Right eye exhibits no discharge. Left eye exhibits no discharge. No scleral icterus.  Neck: No JVD present.  Cardiovascular: Normal rate, regular rhythm, normal heart sounds and intact distal pulses. Exam reveals no gallop and no friction rub.  No murmur heard. Pulmonary/Chest: Breath sounds normal. No respiratory distress. She has no wheezes. She has no rales. She exhibits no tenderness.  Tenderness and pain across mid axillary line along rib.   Abdominal: Soft. Bowel sounds are normal. She exhibits no distension and no mass. There is no abdominal tenderness. There is no rebound and no guarding.  Musculoskeletal:        General: Tenderness (across upper left back. ) present. No edema. Normal range of motion.     Cervical back: Normal range of motion and neck supple.  Lymphadenopathy:       Head (right side): No preauricular, no posterior auricular and no occipital adenopathy present.       Head (left side): No preauricular, no  posterior auricular and no occipital adenopathy present.    She has no cervical adenopathy.    She has no axillary adenopathy.       Right: No supraclavicular adenopathy present.       Left: No supraclavicular adenopathy present.  Neurological: She is alert and oriented to person, place, and time.  Skin: Skin is warm and dry. No rash noted. She is not diaphoretic. No erythema. No pallor.  Psychiatric: She has a normal mood and affect. Her behavior is normal. Judgment and thought content normal.  Nursing note and vitals reviewed.  Imaging studies: 05/30/2017:  Chest, abdomen, and pelvic CTrevealed continued improvement in the right upper lobe mass, right hilar and mediastinal lymphadenopathy consistent with response to therapy. there was interval development of multiple small ill-defined peribronchovascular nodules in both lungs, greatest in the right upper lobe. Some of these were clustered, and were not associated with the bronchovascular bundles such that lymphangitic tumor was unlikely. These were likely inflammatory. There was no evidence of metastatic disease in the abdomen or pelvis 06/07/2018:  Chest, abdomen, and pelvic CTon 06/07/2018 revealed interval development of L4 vertebral body burst fracturewith mild retropulsion of the posterior aspect of the vertebral body. There was associated sclerosis of the L4 vertebral body with significant height loss predominately involving the anterior and mid aspect of the vertebral body. Possibility of pathologic fracture was not excluded. There was a nondisplacedright L4 transverse process fracture. Recommend further evaluation with lumbar spine MRI. There were findings suggestive of mild acute sigmoid colonic diverticulitis. There was similar to mild interval increase in size of previously described lesion within the right upper lobe (3.1 x 1.8 cm to 3.2 x 2.2 cm). Adjacent irregular nodulealong the inferior margin within the right upper lobe  was grossly similar when compared to prior (1.9 x 1.1 cm to 1.2 x 2.0 cm). Additional areas of consolidation within the right lung were grossly similar. There was interval improvement in previously described tree-in-bud nodularity within the left lower lobe with slight interval increase in ground-glass nodularity within the right lower lobe suggestive of atypical infectious process (MAI). 08/12/2018:  PETscanrevealed a 3.1 x 3.2 focus area of opacity in the RIGHTupper lobe (SUV 2.0). Additionally, there was a 1.9 x 2.2 cm perihilar RIGHTlung lesion extending into the superior aspect of the RIGHThilum (SUV 1.8). Both areas considered to be infectious/inflammatory, however low-grade neoplasm cannot be excluded. Short-term follow-up recommended. 11/12/2018:  ChestCTrevealed expected evolution of post radiation pneumonitis/fibrosis in the right suprahilar region.There was no evidence of local recurrence or metastatic disease.There was no adenopathy. There was aorticatherosclerosis. 05/05/2019:  Chest, abdomen, and pelvis CTrevealed evolving postradiation mass-like fibrosis in the upper right lung, similar to the prior study.There was slight enlargement of small right pleural effusion.There were no definitive findings to suggest locally recurrent disease or metastatic disease in the chest, abdomen or pelvis.There was a new compression fractureof superior endplate of [id number] 56% loss of anterior vertebral body height, favored to be a benign compression fracture. There was severe colonic diverticulosis without evidence of acute diverticulitis, aortic atherosclerosis, in addition to left main and 3 vessel coronary artery disease. Therewere calcifications of the aortic valve and mitral annulus.There was diffuse bronchial wall thickening with very mild centrilobular and mild paraseptal emphysema; imaging findings suggestive of underlying COPD. 11/03/2018:  Chest CT revealed stable appearance of RIGHT  upper lobe post radiation fibrosis and bronchiectatic change with similar appearance of consolidative changes and pleural-parenchymal scarring emanating from the RIGHT hilum extending to the periphery. There was a small RIGHT-sided pleural effusion is slightly larger (12 mm greatest depth previously 6 mm).  There was sclerosis of inferior endplate of T6 with slight interval loss of height at this level as compared to the previous study overall approximately 10-20% loss of height. This may represent a subacute fracture. Correlate with any acute pain in this location. There was a similar appearance of T5 compression fracture. She is s/p LEFT mastectomy.  She has three-vessel coronary artery disease.   Infusion on 11/03/2019  Component Date Value Ref Range Status  . CEA 11/03/2019 2.9  0.0 - 4.7 ng/mL Final   Comment: (NOTE)                             Nonsmokers          <3.9                             Smokers             <5.6 [employer] Electrochemiluminescence Immunoassay (ECLIA) Values obtained with different assay methods or kits cannot be used interchangeably.  Results cannot be interpreted as absolute evidence of the presence or absence of malignant disease. Performed At: [facility] [place], [place] [id number] Rush Farmer MD [id number]   . Sodium 11/03/2019 136  135 - 145 mmol/L Final  . Potassium 11/03/2019 4.1  3.5 - 5.1 mmol/L Final  . Chloride 11/03/2019 99  98 - 111 mmol/L Final  . CO2 11/03/2019 28  22 - 32 mmol/L Final  . Glucose, Bld 11/03/2019 102* 70 - 99 mg/dL Final   Glucose reference range applies only to samples taken after fasting for at least 8 hours.  . BUN 11/03/2019 13  8 - 23 mg/dL Final  . Creatinine, Ser 11/03/2019 0.83  0.44 - 1.00 mg/dL Final  . Calcium 11/03/2019 9.3  8.9 - 10.3 mg/dL Final  . Total Protein 11/03/2019 7.0  6.5 - 8.1 g/dL Final  . Albumin 11/03/2019 3.5  3.5 - 5.0 g/dL Final  . AST 11/03/2019 28  15 - 41  U/L Final  . ALT 11/03/2019 23  0 - 44 U/L Final  . Alkaline Phosphatase 11/03/2019 83  38 - 126 U/L Final  . Total Bilirubin 11/03/2019 0.6  0.3 - 1.2 mg/dL Final  . GFR calc non Af Amer 11/03/2019 >60  >60 mL/min Final  . GFR calc Af Amer 11/03/2019 >60  >60 mL/min Final  . Anion gap 11/03/2019 9  5 - 15 Final   Performed at [facility], [address] [place], [place] [address]  . WBC 11/03/2019 9.9  4.0 - 10.5 K/uL Final  . RBC 11/03/2019 4.62  3.87 - 5.11 MIL/uL Final  . Hemoglobin 11/03/2019 13.0  12.0 - 15.0 g/dL Final  . HCT 11/03/2019 41.6  36.0 - 46.0 % Final  . MCV 11/03/2019 90.0  80.0 - 100.0 fL Final  . MCH 11/03/2019 28.1  26.0 - 34.0 pg Final  . MCHC 11/03/2019 31.3  30.0 - 36.0 g/dL Final  . RDW 11/03/2019 16.2* 11.5 - 15.5 % Final  . Platelets 11/03/2019 287  150 - 400 K/uL Final  . nRBC 11/03/2019 0.0  0.0 - 0.2 % Final  . Neutrophils Relative % 11/03/2019 70  % Final  . Neutro Abs 11/03/2019 6.9  1.7 - 7.7 K/uL Final  . Lymphocytes Relative 11/03/2019 21  % Final  . Lymphs Abs 11/03/2019 2.0  0.7 - 4.0 K/uL Final  . Monocytes Relative 11/03/2019 7  % Final  . Monocytes Absolute 11/03/2019 0.7  0.1 - 1.0 K/uL Final  . Eosinophils Relative 11/03/2019 1  % Final  . Eosinophils Absolute 11/03/2019 0.1  0.0 - 0.5 K/uL Final  . Basophils Relative 11/03/2019 0  % Final  . Basophils Absolute 11/03/2019 0.0  0.0 - 0.1 K/uL Final  . Immature Granulocytes 11/03/2019 1  % Final  . Abs Immature Granulocytes 11/03/2019 0.06  0.00 -  0.07 K/uL Final   Performed at [facility], [address], [place], [place] [address]    Assessment:  AREESHA DEHAVEN is a 74 y.o. female with limited stage small cell lung cancer. She had stage IIIB (T3N2M0) disease. She received cisplatin and etoposidefrom 03/05/2017 - 05/14/2017. She received radiationfrom 03/08/2017 - 04/23/2017.  She received PCIfrom 06/19/2017 - 07/11/2017.  Chest, abdomen, and pelvic  CTon 06/07/2018 revealed interval development of L4 vertebral body burst fracturewith mild retropulsion of the posterior aspect of the vertebral body. There was associated sclerosis of the L4 vertebral body with significant height loss predominately involving the anterior and mid aspect of the vertebral body. Possibility of pathologic fracture was not excluded. There was a nondisplacedright L4 transverse process fracture. Recommend further evaluation with lumbar spine MRI. There were findings suggestive of mild acute sigmoid colonic diverticulitis. There was similar to mild interval increase in size of previously described lesion within the right upper lobe (3.1 x 1.8 cm to 3.2 x 2.2 cm). Adjacent irregular nodulealong the inferior margin within the right upper lobe was grossly similar when compared to prior (1.9 x 1.1 cm to 1.2 x 2.0 cm). Additional areas of consolidation within the right lung were grossly similar. There was interval improvement in previously described tree-in-bud nodularity within the left lower lobe with slight interval increase in ground-glass nodularity within the right lower lobe suggestive of atypical infectious process (MAI).  Chest, abdomen, and pelvis CTon 05/05/2019 revealed evolving postradiation mass-like fibrosis in the upper right lung, similar to the prior study.There was slight enlargement of small right pleural effusion.There were no definitive findings to suggest locally recurrent disease or metastatic disease in the chest, abdomen or pelvis.There was a new compression fractureof superior endplate of W2HENI 77% loss of anterior vertebral body height, favored to be a benign compression fracture. There was severe colonic diverticulosis without evidence of acute diverticulitis, aortic atherosclerosis, in addition to left main and 3 vessel coronary artery disease. Therewere calcifications of the aortic valve and mitral annulus.There was diffuse bronchial wall  thickening with very mild centrilobular and mild paraseptal emphysema; imaging findings suggestive of underlying COPD.  Chest CT on 11/03/2019 revealed stable appearance of RIGHT upper lobe post radiation fibrosis and bronchiectatic change with similar appearance of consolidative changes and pleural-parenchymal scarring emanating from the RIGHT hilum extending to the periphery. There was a small RIGHT-sided pleural effusion is slightly larger (12 mm greatest depth previously 6 mm).  There was sclerosis of inferior endplate of T6 with slight interval loss of height at this level as compared to the previous study overall approximately 10-20% loss of height. This may represent a subacute fracture. Correlate with any acute pain in this location. There was a similar appearance of T5 compression fracture. She is s/p LEFT mastectomy.  She has three-vessel coronary artery disease.  CEA has been followed: 2.3 on 07/10/2018, 2.2 on 11/02/2018, and 2.9 on 11/03/2019.  She underwent kyphoplasty of L4with radiofrequency ablation on 06/11/2018. Pathologyrevealed viable bone and trilineage metabolic marrow with reactive changes and focal changes of fracture callus. IHC stains for pancytokeratin were negative. There was no evidence of metastatic disease.  Bone density on 05/21/2019 revealed osteoporosis with a T-score of -3.0 in the left femoral hip and -2.4 in the left forearm radius.  Bone density on 05/21/2019 showed osteoporosis with a T-score of -3.0 in the total left femur.   She has a history of left breast cancerin 2002. She underwent left mastectomy followed by adjuvant chemotherapy.  She was treated by Dr. Oliva Bustard. She did not receive adjuvant radiation.  CA27.29 was 16.0 on 07/29/2019.  Symptomatically, she has a tingling and burning pain in the left side of her chest.  Exam reveals no rash.  She has tenderness along the mid axillary line along a rib.  Plan: 1.   Review labs from 11/03/2019.  2.Limited stage SCLC Clinically, she is doing fair. She has gained weight since her last visit.  Chest, abdomen, and pelvis CT on 05/05/2019 revealed no evidence of recurrent disease. She has a new T5 compression fracture. She has new symptoms suggestive of possible bone metastasis.  Bone scan on 11/10/2019. Etiology of comfort could also be related to her compression fracture. Encourage smoking cessation.  3.L4 burst fractures/pkyphoplasty She has chronic back pain.           She has sacroiliitis per orthopedics. 4.T5 superior endplate compression fracture Unclear if his current symptoms are related to her compression fracture   She has osteoporosis.             Continue calcium and vitamin D.             Contact orthopedics today regarding compression fracture and assessment 5.   Weight loss  Weight is up 7 pounds.  Continue to encourage caloric intake 6. Port-a-cath maintenance Port flush every 6-12 weeks. 7.   Rachelle Hora, PA on phone re: compression fracture. 8.   Bone scan on 11/10/2019. 9.   RTC after bone scan for MD assessment.  I discussed the assessment and treatment plan with the patient.  The patient was provided an opportunity to ask questions and all were answered.  The patient agreed with the plan and demonstrated an understanding of the instructions.  The patient was advised to call back if the symptoms worsen or if the condition fails to improve as anticipated.   Lequita Asal, MD, PhD    11/05/2019, 10:50 AM  I, Heywood Footman, am acting as a Education administrator for Lequita Asal, MD.  I, [place] Mike Gip, MD, have reviewed the above documentation for accuracy and completeness, and I agree with the above.

## 2019-11-05 ENCOUNTER — Encounter: Payer: Self-pay | Admitting: Hematology and Oncology

## 2019-11-05 ENCOUNTER — Other Ambulatory Visit: Payer: Self-pay

## 2019-11-05 ENCOUNTER — Inpatient Hospital Stay (HOSPITAL_BASED_OUTPATIENT_CLINIC_OR_DEPARTMENT_OTHER): Payer: Medicare HMO | Admitting: Hematology and Oncology

## 2019-11-05 VITALS — BP 115/63 | HR 91 | Temp 95.5°F | Resp 16 | Wt 130.6 lb

## 2019-11-05 DIAGNOSIS — C3411 Malignant neoplasm of upper lobe, right bronchus or lung: Secondary | ICD-10-CM

## 2019-11-05 DIAGNOSIS — S22050A Wedge compression fracture of T5-T6 vertebra, initial encounter for closed fracture: Secondary | ICD-10-CM

## 2019-11-05 DIAGNOSIS — R634 Abnormal weight loss: Secondary | ICD-10-CM | POA: Diagnosis not present

## 2019-11-05 DIAGNOSIS — R0781 Pleurodynia: Secondary | ICD-10-CM

## 2019-11-05 MED ORDER — LIDOCAINE-PRILOCAINE 2.5-2.5 % EX CREA: 1.0000 "application " | TOPICAL_CREAM | CUTANEOUS | 2 refills | Status: DC | PRN

## 2019-11-05 NOTE — Patient Instructions (Signed)
  Orthopedic appt at 9:30 AM with Rachelle Hora, PA at the [facility] (orthopedics) tomorrow.

## 2019-11-05 NOTE — Progress Notes (Signed)
Patient states that she has a burning and stinging sensation in her left armpit/breast area. She has been seen for this before and was told to take tramadol but it makes her vomit.

## 2019-11-10 ENCOUNTER — Other Ambulatory Visit: Payer: Self-pay

## 2019-11-10 ENCOUNTER — Encounter
Admission: RE | Admit: 2019-11-10 | Discharge: 2019-11-10 | Disposition: A | Discharge status: Home / Self Care | Payer: Medicare HMO | Source: Ambulatory Visit | Attending: Hematology and Oncology | Admitting: Hematology and Oncology

## 2019-11-10 DIAGNOSIS — R0781 Pleurodynia: Secondary | ICD-10-CM | POA: Insufficient documentation

## 2019-11-10 MED ORDER — TECHNETIUM TC 99M MEDRONATE IV KIT
20.0000 | PACK | Freq: Once | INTRAVENOUS | Status: AC | PRN
  Administered 2019-11-10: 22.2 via INTRAVENOUS

## 2019-11-11 NOTE — Progress Notes (Signed)
[facility]  [address], [address] [place], [place] [address] Phone: [phone]  Fax: [phone]   Clinic Day:  11/12/2019  Referring physician: Gayland Curry, MD  Chief Complaint: Roberta [name] is a 74 y.o. female with  limited stage small cell lung cancer who is seen for review of bone scan and discussion regarding direction of therapy.    HPI: The patient was last seen in the medical oncology clinic on 11/05/2019. At that time, she noted tingling and burning pain in the left side of her chest since 08/2019. She was using a heating pad. She denies any rash or fever.  Her back hurts once in a while, but it had improved. She denied any shortness of breath.  Chest CT on 11/03/2019 revealed stable appearance of RIGHT upper lobe post radiation fibrosis. There was a small RIGHT-sided pleural effusion.  There was sclerosis of inferior endplate of T6 with slight interval loss of height at this level as compared to the previous study overall approximately 10-20% loss of height (possible subacute fracture). She had discomfort on palpation at T6.  Hematocrit was 41.6, hemoglobin 13.0, platelets 287,000, WBC 9,900. CMP was normal. CEA was 2.9.  Orthopedics was contacted.  Bone scan on 11/10/2019 revealed increased activity noted over the midthoracic spine and lower lumbar spine corresponding to previously identified compression fractures. There was no other abnormality noted to suggest metastatic disease. Left ribs were unremarkable.  During the interim, the patient has been doing well. She feels tingling and burning pain on the left side of her chest. She has been alternating with Advil and Tylenol for pain. She is using a heating pain but still feels some stinging. She would like some stronger pain medication.    Past Medical History:  Diagnosis Date  . AAA (abdominal aortic aneurysm) ([place])   . Anemia   . Breast cancer ([place]) 2002   left  . COPD (chronic  obstructive pulmonary disease) ([place])   . Dyspnea   . Headache   . High cholesterol   . Personal history of chemotherapy   . Small cell lung cancer ([place])   . Tobacco abuse     Past Surgical History:  Procedure Laterality Date  . ABDOMINAL HYSTERECTOMY    . ARTERY BIOPSY Right 12/05/2017   Procedure: BIOPSY TEMPORAL ARTERY;  Surgeon: Margaretha Sheffield, MD;  Location: [facility] ORS;  Service: ENT;  Laterality: Right;  . ENDOBRONCHIAL ULTRASOUND N/A 02/19/2017   Procedure: ENDOBRONCHIAL ULTRASOUND;  Surgeon: Flora Lipps, MD;  Location: [facility] ORS;  Service: Cardiopulmonary;  Laterality: N/A;  . ENDOVASCULAR REPAIR/STENT GRAFT N/A 12/06/2016   Procedure: Endovascular Repair/Stent Graft;  Surgeon: Katha Cabal, MD;  Location: [facility];  Service: Cardiovascular;  Laterality: N/A;  . IR FLUORO GUIDE PORT INSERTION RIGHT  03/02/2017  . KYPHOPLASTY N/A 06/11/2018   Procedure: KYPHOPLASTY L4 BIOPSY WITH RFA;  Surgeon: Hessie Knows, MD;  Location: [facility] OR[facility];  Service: Orthopedics;  Laterality: N/A;  . MASTECTOMY Left 2002   with sentinel node     Family History  Problem Relation Age of Onset  . CAD Mother   . Colon cancer Brother   . Breast cancer Neg Hx     Social History:  reports that she has been smoking cigarettes. She has a 25.00 pack-year smoking history. She has never used smokeless tobacco. She reports that she does not drink alcohol and does not use drugs. 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 The patient's sister's name is [name]. She lives with her daughter. The patient is accompanied by [name] today.  Allergies: No Known Allergies  Current Medications: Current Outpatient Medications  Medication Sig Dispense Refill  . acetaminophen (TYLENOL) 325 MG tablet Take 650 mg by mouth every 6 (six) hours as needed for mild pain.     Marland Kitchen  albuterol (2.5 MG/3ML) 0.083% NEBU 3 mL, albuterol (5 MG/ML) 0.5% NEBU 0.5 mL Inhale 1 mg into the lungs.    Marland Kitchen albuterol (PROVENTIL HFA;VENTOLIN HFA) 108 (90 Base) MCG/ACT inhaler Inhale 2 puffs into the lungs every 6 (six) hours as needed for wheezing or shortness of breath. 1 Inhaler 1  . aspirin EC 81 MG tablet Take 81 mg by mouth daily.    Marland Kitchen atorvastatin (LIPITOR) 10 MG tablet Take 10 mg by mouth at bedtime.     . fluticasone-salmeterol (ADVAIR HFA) 115-21 MCG/ACT inhaler Inhale 2 puffs into the lungs 2 (two) times daily.    Marland Kitchen ibuprofen (ADVIL) 400 MG tablet Take 400 mg by mouth every 6 (six) hours as needed.    Marland Kitchen ipratropium (ATROVENT) 0.02 % nebulizer solution Inhale into the lungs.    . lidocaine-prilocaine (EMLA) cream Apply 1 application topically as needed. 30 g 2  . Multiple Vitamin (MULTIVITAMIN) tablet Take 1 tablet by mouth daily.    Marland Kitchen loratadine (CLARITIN) 10 MG tablet Take 1 tablet (10 mg total) by mouth daily. (Patient not taking: Reported on 07/29/2019) 30 tablet 1  . traMADol (ULTRAM) 50 MG tablet Take 1 tablet (50 mg total) by mouth every 6 (six) hours as needed. (Patient not taking: Reported on 11/05/2019) 12 tablet 0   No current facility-administered medications for this visit.   Facility-Administered Medications Ordered in Other Visits  Medication Dose Route Frequency Provider Last Rate Last Admin  . sodium chloride flush (NS) 0.9 % injection 10 mL  10 mL Intravenous PRN Sindy Guadeloupe, MD   10 mL at 03/26/17 0948  . sodium chloride flush (NS) 0.9 % injection 10 mL  10 mL Intravenous PRN Sindy Guadeloupe, MD   10 mL at 04/16/17 1006  . sodium chloride flush (NS) 0.9 % injection 10 mL  10 mL Intravenous PRN Sindy Guadeloupe, MD   10 mL at 01/10/18 1410  . sodium chloride flush (NS) 0.9 % injection 10 mL  10 mL Intravenous PRN Lequita Asal, MD   10 mL at 12/11/18 1033    Review of Systems  Constitutional: Negative for chills, diaphoresis, fever, malaise/fatigue and weight  loss (up 1 lb).       Doing well.  HENT: Positive for hearing loss. Negative for congestion, ear pain, nosebleeds, sinus pain and sore throat.   Eyes: Negative.  Negative for blurred vision, double vision and photophobia.  Respiratory: Negative for cough (chronic), hemoptysis, sputum production (yellow ) and shortness of breath.        COPD.  Smokes half a pack a day.  Cardiovascular: Positive for chest pain (left sided burning pain). Negative for palpitations, orthopnea, leg swelling and PND.  Gastrointestinal: Negative.  Negative for abdominal pain, blood in stool, constipation, diarrhea, heartburn, melena, nausea and vomiting.  Genitourinary: Negative.  Negative for dysuria, frequency, hematuria and urgency.  Musculoskeletal: Negative for back pain (lower back; s/p kyphoplasty; better), falls, joint pain and myalgias.  Skin: Negative.  Negative  for itching and rash.  Neurological: Positive for tingling (left side of chest). Negative for dizziness, tremors, sensory change, speech change, focal weakness, weakness and headaches.  Endo/Heme/Allergies: Negative.  Negative for environmental allergies. Does not bruise/bleed easily.  Psychiatric/Behavioral: Negative for depression and memory loss. The patient is not nervous/anxious and does not have insomnia.   All other systems reviewed and are negative.  Performance status (ECOG): 1  Vitals Blood pressure 108/65, pulse 97, temperature (!) 96.8 F (36 C), temperature source Tympanic, resp. rate 16, weight 131 lb 2.8 oz (59.5 kg), SpO2 100 %.   Physical Exam Vitals and nursing note reviewed.  Constitutional:      General: She is not in acute distress.    Appearance: She is well-developed. She is not diaphoretic.     Comments: Thin woman sitting comfortably in the exam room in no acute distress.  HENT:     Head: Normocephalic and atraumatic.     Mouth/Throat:     Pharynx: No oropharyngeal exudate.  Eyes:     General: No scleral icterus.        Right eye: No discharge.        Left eye: No discharge.     Conjunctiva/sclera: Conjunctivae normal.     Pupils: Pupils are equal, round, and reactive to light.  Neck:     Vascular: No JVD.  Cardiovascular:     Rate and Rhythm: Normal rate and regular rhythm.     Heart sounds: Normal heart sounds. No murmur heard.  No friction rub. No gallop.   Pulmonary:     Effort: No respiratory distress.     Breath sounds: Normal breath sounds. No wheezing or rales.  Chest:     Chest wall: No tenderness.  Abdominal:     General: Bowel sounds are normal. There is no distension.     Palpations: Abdomen is soft. There is no mass.     Tenderness: There is no abdominal tenderness. There is no guarding or rebound.  Musculoskeletal:        General: Tenderness (across upper left back) present. Normal range of motion.     Cervical back: Normal range of motion and neck supple.  Lymphadenopathy:     Head:     Right side of head: No preauricular, posterior auricular or occipital adenopathy.     Left side of head: No preauricular, posterior auricular or occipital adenopathy.     Cervical: No cervical adenopathy.     Upper Body:     Right upper body: No supraclavicular or axillary adenopathy.     Left upper body: No supraclavicular or axillary adenopathy.     Lower Body: No right inguinal adenopathy. No left inguinal adenopathy.  Skin:    General: Skin is warm and dry.     Coloration: Skin is not pale.     Findings: No erythema or rash.  Neurological:     Mental Status: She is alert and oriented to person, place, and time.  Psychiatric:        Behavior: Behavior normal.        Thought Content: Thought content normal.        Judgment: Judgment normal.    Imaging studies: 05/30/2017:  Chest, abdomen, and pelvic CTrevealed continued improvement in the right upper lobe mass, right hilar and mediastinal lymphadenopathy consistent with response to therapy. there was interval development of multiple  small ill-defined peribronchovascular nodules in both lungs, greatest in the right upper lobe. Some of these were  clustered, and were not associated with the bronchovascular bundles such that lymphangitic tumor was unlikely. These were likely inflammatory. There was no evidence of metastatic disease in the abdomen or pelvis 06/07/2018:  Chest, abdomen, and pelvic CTon 06/07/2018 revealed interval development of L4 vertebral body burst fracturewith mild retropulsion of the posterior aspect of the vertebral body. There was associated sclerosis of the L4 vertebral body with significant height loss predominately involving the anterior and mid aspect of the vertebral body. Possibility of pathologic fracture was not excluded. There was a nondisplacedright L4 transverse process fracture. Recommend further evaluation with lumbar spine MRI. There were findings suggestive of mild acute sigmoid colonic diverticulitis. There was similar to mild interval increase in size of previously described lesion within the right upper lobe (3.1 x 1.8 cm to 3.2 x 2.2 cm). Adjacent irregular nodulealong the inferior margin within the right upper lobe was grossly similar when compared to prior (1.9 x 1.1 cm to 1.2 x 2.0 cm). Additional areas of consolidation within the right lung were grossly similar. There was interval improvement in previously described tree-in-bud nodularity within the left lower lobe with slight interval increase in ground-glass nodularity within the right lower lobe suggestive of atypical infectious process (MAI). 08/12/2018:  PETscanrevealed a 3.1 x 3.2 focus area of opacity in the RIGHTupper lobe (SUV 2.0). Additionally, there was a 1.9 x 2.2 cm perihilar RIGHTlung lesion extending into the superior aspect of the RIGHThilum (SUV 1.8). Both areas considered to be infectious/inflammatory, however low-grade neoplasm cannot be excluded. Short-term follow-up recommended. 11/12/2018:  ChestCTrevealed  expected evolution of post radiation pneumonitis/fibrosis in the right suprahilar region.There was no evidence of local recurrence or metastatic disease.There was no adenopathy. There was aorticatherosclerosis. 05/05/2019:  Chest, abdomen, and pelvis CTrevealed evolving postradiation mass-like fibrosis in the upper right lung, similar to the prior study.There was slight enlargement of small right pleural effusion.There were no definitive findings to suggest locally recurrent disease or metastatic disease in the chest, abdomen or pelvis.There was a new compression fractureof superior endplate of U2PNTI 14% loss of anterior vertebral body height, favored to be a benign compression fracture. There was severe colonic diverticulosis without evidence of acute diverticulitis, aortic atherosclerosis, in addition to left main and 3 vessel coronary artery disease. Therewere calcifications of the aortic valve and mitral annulus.There was diffuse bronchial wall thickening with very mild centrilobular and mild paraseptal emphysema; imaging findings suggestive of underlying COPD. 11/03/2018:  Chest CT revealed stable appearance of RIGHT upper lobe post radiation fibrosis and bronchiectatic change with similar appearance of consolidative changes and pleural-parenchymal scarring emanating from the RIGHT hilum extending to the periphery. There was a small RIGHT-sided pleural effusion is slightly larger (12 mm greatest depth previously 6 mm).  There was sclerosis of inferior endplate of T6 with slight interval loss of height at this level as compared to the previous study overall approximately 10-20% loss of height. This may represent a subacute fracture. Correlate with any acute pain in this location. There was a similar appearance of T5 compression fracture. She is s/p LEFT mastectomy.  She has three-vessel coronary artery disease. 11/10/2019:  Bone scan revealed increased activity noted over the midthoracic spine and  lower lumbar spine corresponding to previously identified compression fractures. There was no other abnormality noted to suggest metastatic disease. Left ribs were unremarkable.   No visits with results within 3 Day(s) from this visit.  Latest known visit with results is:  Infusion on 11/03/2019  Component Date Value Ref Range Status  .  CEA 11/03/2019 2.9  0.0 - 4.7 ng/mL Final   Comment: (NOTE)                             Nonsmokers          <3.9                             Smokers             <5.6 [employer] Electrochemiluminescence Immunoassay (ECLIA) Values obtained with different assay methods or kits cannot be used interchangeably.  Results cannot be interpreted as absolute evidence of the presence or absence of malignant disease. Performed At: [facility] [place], [place] [id number] Rush Farmer MD YQ:[id number]   . Sodium 11/03/2019 136  135 - 145 mmol/L Final  . Potassium 11/03/2019 4.1  3.5 - 5.1 mmol/L Final  . Chloride 11/03/2019 99  98 - 111 mmol/L Final  . CO2 11/03/2019 28  22 - 32 mmol/L Final  . Glucose, Bld 11/03/2019 102* 70 - 99 mg/dL Final   Glucose reference range applies only to samples taken after fasting for at least 8 hours.  . BUN 11/03/2019 13  8 - 23 mg/dL Final  . Creatinine, Ser 11/03/2019 0.83  0.44 - 1.00 mg/dL Final  . Calcium 11/03/2019 9.3  8.9 - 10.3 mg/dL Final  . Total Protein 11/03/2019 7.0  6.5 - 8.1 g/dL Final  . Albumin 11/03/2019 3.5  3.5 - 5.0 g/dL Final  . AST 11/03/2019 28  15 - 41 U/L Final  . ALT 11/03/2019 23  0 - 44 U/L Final  . Alkaline Phosphatase 11/03/2019 83  38 - 126 U/L Final  . Total Bilirubin 11/03/2019 0.6  0.3 - 1.2 mg/dL Final  . GFR calc non Af Amer 11/03/2019 >60  >60 mL/min Final  . GFR calc Af Amer 11/03/2019 >60  >60 mL/min Final  . Anion gap 11/03/2019 9  5 - 15 Final   Performed at [facility], [address] [place], [place] [address]  . WBC 11/03/2019 9.9   4.0 - 10.5 K/uL Final  . RBC 11/03/2019 4.62  3.87 - 5.11 MIL/uL Final  . Hemoglobin 11/03/2019 13.0  12.0 - 15.0 g/dL Final  . HCT 11/03/2019 41.6  36 - 46 % Final  . MCV 11/03/2019 90.0  80.0 - 100.0 fL Final  . MCH 11/03/2019 28.1  26.0 - 34.0 pg Final  . MCHC 11/03/2019 31.3  30.0 - 36.0 g/dL Final  . RDW 11/03/2019 16.2* 11.5 - 15.5 % Final  . Platelets 11/03/2019 287  150 - 400 K/uL Final  . nRBC 11/03/2019 0.0  0.0 - 0.2 % Final  . Neutrophils Relative % 11/03/2019 70  % Final  . Neutro Abs 11/03/2019 6.9  1.7 - 7.7 K/uL Final  . Lymphocytes Relative 11/03/2019 21  % Final  . Lymphs Abs 11/03/2019 2.0  0.7 - 4.0 K/uL Final  . Monocytes Relative 11/03/2019 7  % Final  . Monocytes Absolute 11/03/2019 0.7  0 - 1 K/uL Final  . Eosinophils Relative 11/03/2019 1  % Final  . Eosinophils Absolute 11/03/2019 0.1  0 - 0 K/uL Final  . Basophils Relative 11/03/2019 0  % Final  . Basophils Absolute 11/03/2019 0.0  0 - 0 K/uL Final  . Immature Granulocytes 11/03/2019 1  % Final  . Abs Immature Granulocytes 11/03/2019  0.06  0.00 - 0.07 K/uL Final   Performed at [facility], [address] [place], [place] [address]    Assessment:  NICHOLA CIESLINSKI is a 74 y.o. female with limited stage small cell lung cancer. She had stage IIIB (T3N2M0) disease. She received cisplatin and etoposidefrom 03/05/2017 - 05/14/2017. She received radiationfrom 03/08/2017 - 04/23/2017.  She received PCIfrom 06/19/2017 - 07/11/2017.  Chest, abdomen, and pelvic CTon 06/07/2018 revealed interval development of L4 vertebral body burst fracturewith mild retropulsion of the posterior aspect of the vertebral body. There was associated sclerosis of the L4 vertebral body with significant height loss predominately involving the anterior and mid aspect of the vertebral body. Possibility of pathologic fracture was not excluded. There was a nondisplacedright L4 transverse process fracture. Recommend further  evaluation with lumbar spine MRI. There were findings suggestive of mild acute sigmoid colonic diverticulitis. There was similar to mild interval increase in size of previously described lesion within the right upper lobe (3.1 x 1.8 cm to 3.2 x 2.2 cm). Adjacent irregular nodulealong the inferior margin within the right upper lobe was grossly similar when compared to prior (1.9 x 1.1 cm to 1.2 x 2.0 cm). Additional areas of consolidation within the right lung were grossly similar. There was interval improvement in previously described tree-in-bud nodularity within the left lower lobe with slight interval increase in ground-glass nodularity within the right lower lobe suggestive of atypical infectious process (MAI).  Chest, abdomen, and pelvis CTon 05/05/2019 revealed evolving postradiation mass-like fibrosis in the upper right lung, similar to the prior study.There was slight enlargement of small right pleural effusion.There were no definitive findings to suggest locally recurrent disease or metastatic disease in the chest, abdomen or pelvis.There was a new compression fractureof superior endplate of [id number] 82% loss of anterior vertebral body height, favored to be a benign compression fracture. There was severe colonic diverticulosis without evidence of acute diverticulitis, aortic atherosclerosis, in addition to left main and 3 vessel coronary artery disease. Therewere calcifications of the aortic valve and mitral annulus.There was diffuse bronchial wall thickening with very mild centrilobular and mild paraseptal emphysema; imaging findings suggestive of underlying COPD.  Chest CT on 11/03/2019 revealed stable appearance of RIGHT upper lobe post radiation fibrosis and bronchiectatic change with similar appearance of consolidative changes and pleural-parenchymal scarring emanating from the RIGHT hilum extending to the periphery. There was a small RIGHT-sided pleural effusion is slightly larger (12  mm greatest depth previously 6 mm).  There was sclerosis of inferior endplate of T6 with slight interval loss of height at this level as compared to the previous study overall approximately 10-20% loss of height. This may represent a subacute fracture. Correlate with any acute pain in this location. There was a similar appearance of T5 compression fracture. She is s/p LEFT mastectomy.  She has three-vessel coronary artery disease.  Bone scan on 11/10/2019 revealed increased activity noted over the midthoracic spine and lower lumbar spine corresponding to previously identified compression fractures. There was no other abnormality noted to suggest metastatic disease. Left ribs were unremarkable.  CEA has been followed: 2.3 on 07/10/2018, 2.2 on 11/02/2018, and 2.9 on 11/03/2019.  She underwent kyphoplasty of L4with radiofrequency ablation on 06/11/2018. Pathologyrevealed viable bone and trilineage metabolic marrow with reactive changes and focal changes of fracture callus. IHC stains for pancytokeratin were negative. There was no evidence of metastatic disease.  Bone density on 05/21/2019 revealed osteoporosis with a T-score of -3.0 in the left femoral hip and -2.4  in the left forearm radius.  Bone density on 05/21/2019 showed osteoporosis with a T-score of -3.0 in the total left femur.   She has a history of left breast cancerin 2002. She underwent left mastectomy followed by adjuvant chemotherapy. She was treated by Dr. Oliva Bustard. She did not receive adjuvant radiation.  CA27.29 was 16.0 on 07/29/2019.  Symptomatically, she is doing well.  She notes left sided chest burning and tingling.  Exam reveals no rash or palpable abnormality.  Plan: 1.   Limited stage SCLC Clinically, she is doing well. Exam reveals no evidence of recurrent disease. Chest CT on 11/03/2019 revealed no evidence of recurrent disease. Bone scan on 11/10/2019 revealed no evidence of metastatic disease. Discuss ongoing  surveillance 2.L4 burst fractures/pkyphoplasty She has chronic back pain. She has sacroiliitis per orthopedics. 3.T5 superior endplate compression fracture She appears to have pain associated with fracture with extension around ribs.  Contact Dr. Theodore Demark office for evaluation. 4.   Weight loss  Patient's weight is up 1 pound.  Continue to monitor closely 5. Port-a-cath maintenance Port flush every 6-12 weeks. 6.    RTC in 3 months for MD assessment and labs (CC with diff, CMP).  I discussed the assessment and treatment plan with the patient.  The patient was provided an opportunity to ask questions and all were answered.  The patient agreed with the plan and demonstrated an understanding of the instructions.  The patient was advised to call back if the symptoms worsen or if the condition fails to improve as anticipated.   Lequita Asal, MD, PhD    11/12/2019, 2:19 PM  I, Selena Batten, am acting as a scribe for Lequita Asal, MD.  I, Thompson Mike Gip, MD, have reviewed the above documentation for accuracy and completeness, and I agree with the above.

## 2019-11-12 ENCOUNTER — Encounter: Payer: Self-pay | Admitting: Hematology and Oncology

## 2019-11-12 ENCOUNTER — Other Ambulatory Visit: Payer: Self-pay

## 2019-11-12 ENCOUNTER — Inpatient Hospital Stay (HOSPITAL_BASED_OUTPATIENT_CLINIC_OR_DEPARTMENT_OTHER): Payer: Medicare HMO | Admitting: Hematology and Oncology

## 2019-11-12 VITALS — BP 108/65 | HR 97 | Temp 96.8°F | Resp 16 | Wt 131.2 lb

## 2019-11-12 DIAGNOSIS — R0781 Pleurodynia: Secondary | ICD-10-CM | POA: Diagnosis not present

## 2019-11-12 DIAGNOSIS — S22050D Wedge compression fracture of T5-T6 vertebra, subsequent encounter for fracture with routine healing: Secondary | ICD-10-CM

## 2019-11-12 DIAGNOSIS — Z95828 Presence of other vascular implants and grafts: Secondary | ICD-10-CM

## 2019-11-12 DIAGNOSIS — C3411 Malignant neoplasm of upper lobe, right bronchus or lung: Secondary | ICD-10-CM

## 2019-11-12 DIAGNOSIS — S22050A Wedge compression fracture of T5-T6 vertebra, initial encounter for closed fracture: Secondary | ICD-10-CM

## 2019-11-12 MED ORDER — HYDROCODONE-ACETAMINOPHEN 5-325 MG PO TABS: 1.0000 | ORAL_TABLET | Freq: Four times a day (QID) | ORAL | 0 refills | Status: DC | PRN

## 2019-11-26 ENCOUNTER — Other Ambulatory Visit: Payer: Self-pay | Admitting: Hematology and Oncology

## 2019-11-26 ENCOUNTER — Other Ambulatory Visit: Payer: Self-pay

## 2019-11-26 DIAGNOSIS — R0781 Pleurodynia: Secondary | ICD-10-CM

## 2019-11-27 ENCOUNTER — Other Ambulatory Visit: Payer: Self-pay

## 2019-11-27 DIAGNOSIS — R0781 Pleurodynia: Secondary | ICD-10-CM

## 2019-11-27 MED ORDER — HYDROCODONE-ACETAMINOPHEN 5-325 MG PO TABS: 1.0000 | ORAL_TABLET | Freq: Four times a day (QID) | ORAL | 0 refills | Status: DC | PRN

## 2019-11-29 DIAGNOSIS — R0781 Pleurodynia: Secondary | ICD-10-CM | POA: Insufficient documentation

## 2019-12-11 ENCOUNTER — Other Ambulatory Visit: Payer: Self-pay | Admitting: Orthopedic Surgery

## 2019-12-12 ENCOUNTER — Other Ambulatory Visit: Payer: Medicare HMO

## 2019-12-15 ENCOUNTER — Other Ambulatory Visit: Payer: Self-pay

## 2019-12-15 ENCOUNTER — Other Ambulatory Visit
Admission: RE | Admit: 2019-12-15 | Discharge: 2019-12-15 | Disposition: A | Discharge status: Home / Self Care | Payer: Medicare HMO | Source: Ambulatory Visit | Attending: Orthopedic Surgery | Admitting: Orthopedic Surgery

## 2019-12-15 ENCOUNTER — Encounter
Admission: RE | Admit: 2019-12-15 | Discharge: 2019-12-15 | Disposition: A | Discharge status: Home / Self Care | Payer: Medicare HMO | Source: Ambulatory Visit | Attending: Orthopedic Surgery | Admitting: Orthopedic Surgery

## 2019-12-15 DIAGNOSIS — Z01812 Encounter for preprocedural laboratory examination: Secondary | ICD-10-CM | POA: Insufficient documentation

## 2019-12-15 DIAGNOSIS — Z20822 Contact with and (suspected) exposure to covid-19: Secondary | ICD-10-CM | POA: Insufficient documentation

## 2019-12-15 LAB — SARS CORONAVIRUS 2 (TAT 6-24 HRS): SARS Coronavirus 2: NEGATIVE

## 2019-12-15 NOTE — H&P (Signed)
Chief Complaint  Patient presents with  . Spine - Pain   Roberta [name] is a 74 y.o. female who presents today for evaluation T6 compression fractures. She has had pain for several months. No trauma or injury. Initially had CT scan of the chest showing T5 and T6 compression fractures. Bone scan confirmed increased uptake along the lower vertebrae, T6. All CT scans show old fracture at T5. She is having pain along the mid thoracic spine that has been debilitating. She is unable to stand for long periods of time due to severe pain. She is taking Norco as needed for severe pain. Pain is 8 out of 10. She denies any radicular symptoms. She has a hard time lying down due to the back pain. She is not on blood thinners.  Past Medical History: Past Medical History:  Diagnosis Date  . Cancer (CMS-HCC)  left breast cancer, in 2002- pt did chemo/ no radiation  . COPD (chronic obstructive pulmonary disease) (CMS-HCC)  dr. Rosalva Ferron  . Diverticulosis 05/15/2014  Sigmoid Colon  . Malignant neoplasm of right upper lobe of lung (CMS-HCC) 04/24/2017  . Pure hypercholesterolemia 03/11/2012   Past Surgical History: Past Surgical History:  Procedure Laterality Date  . CATARACT EXTRACTION  bilat  . COLONOSCOPY 05/15/2014  FHx of colon polyps-Sister/Diverticulosis-Sigmoid colon/Repeat 18yrs/PYO  . INSERTION CENTRAL VENOUS ACCESS DEVICE W/ SUBCUTANEOUS PORT  . laproscopic abd surgery  per patient, for GYN issues  . MASTECTOMY  left   Past Family History: Family History  Problem Relation Age of Onset  . High blood pressure (Hypertension) Mother  . Myocardial Infarction (Heart attack) Mother  . Stroke Mother  . Colon polyps Sister   Medications: Current Outpatient Medications Ordered in Epic  Medication Sig Dispense Refill  . HYDROcodone-acetaminophen (NORCO) 5-325 mg tablet Take by mouth  . acetaminophen (TYLENOL) 500 MG tablet Take by mouth  . ADVAIR HFA 115-21 mcg/actuation inhaler INHALE  2 PUFFS BY MOUTH INTO THE LUNGS EVERY 12 HOURS 12 g 3  . albuterol 90 mcg/actuation inhaler Inhale 2 inhalations into the lungs every 6 (six) hours as needed for Wheezing or Shortness of Breath 1 Inhaler 5  . aspirin 81 MG EC tablet Take 81 mg by mouth once daily.  Marland Kitchen atorvastatin (LIPITOR) 10 MG tablet Take 1 tablet (10 mg total) by mouth once daily 30 tablet 9  . ibuprofen (MOTRIN) 400 MG tablet Take by mouth  . ipratropium (ATROVENT HFA) inhaler Inhale 2 inhalations into the lungs 3 (three) times daily 1 Inhaler 1  . ipratropium (ATROVENT) 0.02 % nebulizer solution Take 2.5 mLs (0.5 mg total) by nebulization 4 (four) times daily. 300 mL 12  . lidocaine-prilocaine (EMLA) cream Place small amount of cream over port site when she gets port flushes  . loratadine (CLARITIN) 10 mg tablet Take by mouth  . megestroL (MEGACE) 400 mg/10 mL (40 mg/mL) suspension Take by mouth  . predniSONE (DELTASONE) 10 MG tablet Take 1 tablet (10 mg total) by mouth once daily 10 day taper, 5,5,4,4,3,3,2,2,1,1. Start with 5 tabs daily for 2 days then taper down 1 tab every 2 days. 30 tablet 0  . traMADoL (ULTRAM) 50 mg tablet Take by mouth   No current Epic-ordered facility-administered medications on file.   Allergies: No Known Allergies   Review of Systems:  A comprehensive 14 point ROS was performed, reviewed by me today, and the pertinent orthopaedic findings are documented in the HPI.  Exam: BP 96/78  Pulse 94  Ht 157.5  cm (5\' 2" )  Wt 60.2 kg (132 lb 12.8 oz)  SpO2 92%  BMI 24.29 kg/m  General/Constitutional: The patient appears to be well-nourished, well-developed, and in no acute distress. Neuro/Psych: Normal mood and affect, oriented to person, place and time. Eyes: Non-icteric. Pupils are equal, round, and reactive to light, and exhibit synchronous movement. ENT: Unremarkable. Lymphatic: No palpable adenopathy. Respiratory: Non-labored breathing Cardiovascular: No edema, swelling or tenderness,  except as noted in detailed exam. Integumentary: No impressive skin lesions present, except as noted in detailed exam. Musculoskeletal: Unremarkable, except as noted in detailed exam.  General:  Well developed, well nourished, no apparent distress, normal affect, slow antalgic gait.  HEENT: Head normocephalic, atraumatic, PERRL.   Abdomen: Soft, non tender, non distended, Bowel sounds present.  Heart: Examination of the heart reveals regular, rate, and rhythm. There is no murmur noted on ascultation. There is a normal apical pulse.  Lungs: Lungs are clear to auscultation. There is no wheeze, rhonchi, or crackles. There is normal expansion of bilateral chest walls.   Thoracic spine: Examination of the thoracic spine shows patient has tenderness to percussion along the mid thoracic spine along T5 and T6. No paravertebral muscle tenderness. No ecchymosis swelling or skin breakdown noted. She is nontender along the lumbar spine or cervical spinous process.  Imaging: AP and lateral views of the thoracic spine are ordered interpreted by me in the office today. Impression: Patient has evidence of 10 to 20% loss vertebral body height at T6. Chronic stable compression fracture at T5. There is no other definite compression fractures noted throughout the thoracic spine. Patient is noted to have osteopenia.  EXAM: NUCLEAR MEDICINE WHOLE BODY BONE SCAN  TECHNIQUE: Whole body anterior and posterior images were obtained approximately 3 hours after intravenous injection of radiopharmaceutical.  RADIOPHARMACEUTICALS: 22.2 mCi Technetium-63m MDP IV  COMPARISON: CT chest 11/03/2019, 11/06/2018, 05/30/2017. PET-CT 08/12/2018.  FINDINGS: Bilateral renal function and excretion. Increased activity noted over the midthoracic spine and lower lumbar spine corresponding to previously identified compression fractures. Minimal punctate area of increased activity noted over the scapula most likely  related to prior injury. Stable mild sclerosis noted this region on multiple prior CTs. Mild increased activity noted over the cervical spine, most likely degenerative mild increased activity left ankle, most likely degenerative. No other bony abnormality identified to suggest metastatic disease. Left ribs are unremarkable.  IMPRESSION: 1. Increased activity noted over the midthoracic spine and lower lumbar spine corresponding to previously identified compression fractures.  2. No other abnormality noted to suggest metastatic disease. Left ribs are unremarkable.   Electronically Signed By: Marcello Moores Register On: 11/11/2019 06:09  CLINICAL DATA: Follow-up small cell lung cancer, history of chemotherapy.  EXAM: CT CHEST WITH CONTRAST  TECHNIQUE: Multidetector CT imaging of the chest was performed during intravenous contrast administration.  CONTRAST: 65mL OMNIPAQUE IOHEXOL 300 MG/ML SOLN  COMPARISON: 01/20/2020  FINDINGS: Cardiovascular: Calcified and noncalcified atheromatous plaque of the thoracic aorta. Heart size is stable without pericardial effusion. Three-vessel coronary artery disease. Central venous access device entering via LEFT-sided approach terminates at the caval to atrial junction. Venous phase assessment of central pulmonary vasculature is unremarkable.  Mediastinum/Nodes: Thoracic inlet structures are normal.  No axillary lymphadenopathy.  Soft tissue about the RIGHT hilum, see below. No distinct mediastinal adenopathy. Esophagus grossly normal.  Lungs/Pleura: Biapical pleural and parenchymal scarring with RIGHT upper lobe post radiation fibrosis and bronchiectatic change with similar appearance of consolidative changes and pleural-parenchymal scarring emanating from the RIGHT hilum extending to the periphery.  Small RIGHT-sided pleural effusion is slightly larger at approximately 12 mm greatest depth previously approximately 6  mm. Airways are patent.  Upper Abdomen: Incidental imaging of upper abdominal contents without acute process. Imaged portions of the adrenal glands are normal. Adrenal glands are incompletely imaged.  Musculoskeletal: Post LEFT mastectomy. No chest wall mass.  T5 and T6 compression fractures involving superior endplate of T5 and inferior endplate of T6 slightly increased loss of height at T6 with further sclerosis at this level since the prior study. Osteopenia.  IMPRESSION: 1. 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 2. Small RIGHT-sided pleural effusion is slightly larger at approximately 12 mm greatest depth previously approximately 6 mm. 3. Sclerosis of inferior endplate of T6 with slight interval loss of height at this level as compared to the previous study overall approximately 10-20% loss of height. This may represent a subacute fracture given appearance. Correlate with any acute pain in this location 4. Similar appearance of T5 compression fracture. 5. Post LEFT mastectomy. 6. Three-vessel coronary artery disease. 7. Aortic atherosclerosis.  Impression: Closed wedge compression fracture of T5 vertebra, initial encounter (CMS-HCC) [S22.050A] Closed wedge compression fracture of T5 vertebra, initial encounter (CMS-HCC) (primary encounter diagnosis) Closed wedge compression fracture of T6 vertebra, initial encounter (CMS-HCC)  Plan:  56. 74 year old female with severe midline back pain has been present for 2 to 3 months. Despite narcotics she is still having severe pain with no improvement. Scans have confirmed fracture and also shown these to be acute at T6. Her T5 fracture appears to be chronic. Risks, benefits, complications of a T6 kyphoplasty procedure have been discussed with the  patient. Patient has agreed and consented procedure with Dr. Hessie Knows. This note was generated in part with voice recognition software and I apologize for any typographical errors that were not detected and corrected.  Feliberto Gottron MPA-C    Electronically signed by Feliberto Gottron, PA at 12/09/2019 9:31 AM EDT   Reviewed paper H+P, will be scanned into chart. No changes noted.

## 2019-12-15 NOTE — Patient Instructions (Addendum)
Your procedure is scheduled on: 12/16/19 Report to [facility] at 12:00. To find out your arrival time please call [phone] between 1PM - 3PM on .  Remember: Instructions that are not followed completely may result in serious medical risk, up to and including death, or upon the discretion of your surgeon and anesthesiologist your surgery may need to be rescheduled.     _X__ 1. Do not eat food after midnight the night before your procedure.                 No gum chewing or hard candies. You may drink clear liquids up to 2 hours                 before you are scheduled to arrive for your surgery- DO not drink clear                 liquids within 2 hours of the start of your surgery.                 Clear Liquids include:  water, apple juice without pulp, clear carbohydrate                 drink such as Clearfast or Gatorade, Black Coffee or Tea (Do not add                 anything to coffee or tea). Diabetics water only  __X__2.  On the morning of surgery brush your teeth with toothpaste and water, you                 may rinse your mouth with mouthwash if you wish.  Do not swallow any              toothpaste of mouthwash.     _X__ 3.  No Alcohol for 24 hours before or after surgery.   _X__ 4.  Do Not Smoke or use e-cigarettes For 24 Hours Prior to Your Surgery.                 Do not use any chewable tobacco products for at least 6 hours prior to                 surgery.  ____  5.  Bring all medications with you on the day of surgery if instructed.   __X__  6.  Notify your doctor if there is any change in your medical condition      (cold, fever, infections).     Do not wear jewelry, make-up, hairpins, clips or nail polish. Do not wear lotions, powders, or perfumes.  Do not shave 48 hours prior to surgery. Men may shave face and neck. Do not bring valuables to the hospital.    [facility] is not responsible for any belongings  or valuables.  Contacts, dentures/partials or body piercings may not be worn into surgery. Bring a case for your contacts, glasses or hearing aids, a denture cup will be supplied. Leave your suitcase in the car. After surgery it may be brought to your room. For patients admitted to the hospital, discharge time is determined by your treatment team.   Patients discharged the day of surgery will not be allowed to drive home.   Please read over the following fact sheets that you were given:   MRSA Information  __X__ Take these medicines the morning of surgery with A SIP OF WATER:  1. HYDROcodone-acetaminophen (NORCO) 5-325 MG tablet if needed  2.   3.   4.  5.  6.  ____ Fleet Enema (as directed)   ____ Use CHG Soap/SAGE wipes as directed  __X__ Use inhalers on the day of surgery  ____ Stop metformin/Janumet/Farxiga 2 days prior to surgery    ____ Take 1/2 of usual insulin dose the night before surgery. No insulin the morning          of surgery.   ____ Stop Blood Thinners Coumadin/Plavix/Xarelto/Pleta/Pradaxa/Eliquis/Effient/Aspirin  on   Or contact your Surgeon, Cardiologist or Medical Doctor regarding  ability to stop your blood thinners  __X__ Stop Anti-inflammatories 7 days before surgery such as Advil, Ibuprofen, Motrin,  BC or Goodies Powder, Naprosyn, Naproxen, Aleve, Aspirin    __X__ Stop all herbal supplements, fish oil or vitamin E until after surgery.    ____ Bring C-Pap to the hospital.

## 2019-12-16 ENCOUNTER — Ambulatory Visit: Payer: Medicare HMO | Admitting: Registered Nurse

## 2019-12-16 ENCOUNTER — Other Ambulatory Visit: Payer: Self-pay

## 2019-12-16 ENCOUNTER — Ambulatory Visit: Payer: Medicare HMO

## 2019-12-16 ENCOUNTER — Encounter: Payer: Self-pay | Admitting: Orthopedic Surgery

## 2019-12-16 ENCOUNTER — Encounter
Admission: RE | Disposition: A | Discharge status: Home / Self Care | Payer: Self-pay | Source: Home / Self Care | Attending: Orthopedic Surgery

## 2019-12-16 ENCOUNTER — Ambulatory Visit
Admission: RE | Admit: 2019-12-16 | Discharge: 2019-12-16 | Disposition: A | Discharge status: Home / Self Care | Payer: Medicare HMO | Attending: Orthopedic Surgery | Admitting: Orthopedic Surgery

## 2019-12-16 DIAGNOSIS — E7800 Pure hypercholesterolemia, unspecified: Secondary | ICD-10-CM | POA: Insufficient documentation

## 2019-12-16 DIAGNOSIS — I714 Abdominal aortic aneurysm, without rupture: Secondary | ICD-10-CM | POA: Insufficient documentation

## 2019-12-16 DIAGNOSIS — Z79899 Other long term (current) drug therapy: Secondary | ICD-10-CM | POA: Diagnosis not present

## 2019-12-16 DIAGNOSIS — Z85118 Personal history of other malignant neoplasm of bronchus and lung: Secondary | ICD-10-CM | POA: Diagnosis not present

## 2019-12-16 DIAGNOSIS — Z7982 Long term (current) use of aspirin: Secondary | ICD-10-CM | POA: Diagnosis not present

## 2019-12-16 DIAGNOSIS — S22050A Wedge compression fracture of T5-T6 vertebra, initial encounter for closed fracture: Secondary | ICD-10-CM | POA: Diagnosis present

## 2019-12-16 DIAGNOSIS — X58XXXA Exposure to other specified factors, initial encounter: Secondary | ICD-10-CM | POA: Diagnosis not present

## 2019-12-16 DIAGNOSIS — Z853 Personal history of malignant neoplasm of breast: Secondary | ICD-10-CM | POA: Insufficient documentation

## 2019-12-16 DIAGNOSIS — I739 Peripheral vascular disease, unspecified: Secondary | ICD-10-CM | POA: Diagnosis not present

## 2019-12-16 DIAGNOSIS — J449 Chronic obstructive pulmonary disease, unspecified: Secondary | ICD-10-CM | POA: Insufficient documentation

## 2019-12-16 DIAGNOSIS — F17200 Nicotine dependence, unspecified, uncomplicated: Secondary | ICD-10-CM | POA: Insufficient documentation

## 2019-12-16 DIAGNOSIS — Z79891 Long term (current) use of opiate analgesic: Secondary | ICD-10-CM | POA: Diagnosis not present

## 2019-12-16 DIAGNOSIS — Z7951 Long term (current) use of inhaled steroids: Secondary | ICD-10-CM | POA: Diagnosis not present

## 2019-12-16 DIAGNOSIS — S22000A Wedge compression fracture of unspecified thoracic vertebra, initial encounter for closed fracture: Secondary | ICD-10-CM

## 2019-12-16 DIAGNOSIS — Z9221 Personal history of antineoplastic chemotherapy: Secondary | ICD-10-CM | POA: Diagnosis not present

## 2019-12-16 DIAGNOSIS — R0781 Pleurodynia: Secondary | ICD-10-CM

## 2019-12-16 HISTORY — PX: KYPHOPLASTY: SHX5884

## 2019-12-16 SURGERY — KYPHOPLASTY
Anesthesia: General | Site: Spine Thoracic

## 2019-12-16 MED ORDER — ONDANSETRON HCL 4 MG/2ML IJ SOLN: 4.0000 mg | Freq: Four times a day (QID) | INTRAMUSCULAR | Status: DC | PRN

## 2019-12-16 MED ORDER — FAMOTIDINE 20 MG PO TABS
ORAL_TABLET | ORAL | Status: AC
  Administered 2019-12-16: 20 mg via ORAL
  Filled 2019-12-16: qty 1

## 2019-12-16 MED ORDER — FAMOTIDINE 20 MG PO TABS: 20.0000 mg | ORAL_TABLET | Freq: Once | ORAL | Status: AC

## 2019-12-16 MED ORDER — METOCLOPRAMIDE HCL 5 MG/ML IJ SOLN: 5.0000 mg | Freq: Three times a day (TID) | INTRAMUSCULAR | Status: DC | PRN

## 2019-12-16 MED ORDER — HYDROCODONE-ACETAMINOPHEN 5-325 MG PO TABS: 1.0000 | ORAL_TABLET | Freq: Four times a day (QID) | ORAL | 0 refills | Status: DC | PRN

## 2019-12-16 MED ORDER — PROPOFOL 10 MG/ML IV BOLUS
INTRAVENOUS | Status: DC | PRN
Start: 2019-12-16 — End: 2019-12-16
  Administered 2019-12-16: 10 mg via INTRAVENOUS
  Administered 2019-12-16: 20 mg via INTRAVENOUS

## 2019-12-16 MED ORDER — CHLORHEXIDINE GLUCONATE 0.12 % MT SOLN
OROMUCOSAL | Status: AC
  Administered 2019-12-16: 15 mL via OROMUCOSAL
  Filled 2019-12-16: qty 15

## 2019-12-16 MED ORDER — PROPOFOL 10 MG/ML IV BOLUS
INTRAVENOUS | Status: AC
  Filled 2019-12-16: qty 20

## 2019-12-16 MED ORDER — PROPOFOL 500 MG/50ML IV EMUL
INTRAVENOUS | Status: DC | PRN
  Administered 2019-12-16: 75 ug/kg/min via INTRAVENOUS

## 2019-12-16 MED ORDER — ORAL CARE MOUTH RINSE: 15.0000 mL | Freq: Once | OROMUCOSAL | Status: AC

## 2019-12-16 MED ORDER — BUPIVACAINE-EPINEPHRINE (PF) 0.5% -1:200000 IJ SOLN
INTRAMUSCULAR | Status: DC | PRN
  Administered 2019-12-16: 20 mL

## 2019-12-16 MED ORDER — FENTANYL CITRATE (PF) 100 MCG/2ML IJ SOLN: 25.0000 ug | INTRAMUSCULAR | Status: DC | PRN

## 2019-12-16 MED ORDER — METOCLOPRAMIDE HCL 10 MG PO TABS: 5.0000 mg | ORAL_TABLET | Freq: Three times a day (TID) | ORAL | Status: DC | PRN

## 2019-12-16 MED ORDER — ONDANSETRON HCL 4 MG PO TABS: 4.0000 mg | ORAL_TABLET | Freq: Four times a day (QID) | ORAL | Status: DC | PRN

## 2019-12-16 MED ORDER — GLYCOPYRROLATE 0.2 MG/ML IJ SOLN
INTRAMUSCULAR | Status: AC
  Filled 2019-12-16: qty 1

## 2019-12-16 MED ORDER — CEFAZOLIN SODIUM-DEXTROSE 2-4 GM/100ML-% IV SOLN
INTRAVENOUS | Status: AC
  Filled 2019-12-16: qty 100

## 2019-12-16 MED ORDER — LIDOCAINE HCL (CARDIAC) PF 100 MG/5ML IV SOSY
PREFILLED_SYRINGE | INTRAVENOUS | Status: DC | PRN
  Administered 2019-12-16: 60 mg via INTRAVENOUS

## 2019-12-16 MED ORDER — KETAMINE HCL 10 MG/ML IJ SOLN
INTRAMUSCULAR | Status: DC | PRN
  Administered 2019-12-16: 20 mg via INTRAVENOUS

## 2019-12-16 MED ORDER — CEFAZOLIN SODIUM-DEXTROSE 1-4 GM/50ML-% IV SOLN
INTRAVENOUS | Status: AC
  Filled 2019-12-16: qty 50

## 2019-12-16 MED ORDER — SODIUM CHLORIDE (PF) 0.9 % IJ SOLN
INTRAMUSCULAR | Status: AC
  Filled 2019-12-16: qty 10

## 2019-12-16 MED ORDER — CHLORHEXIDINE GLUCONATE 0.12 % MT SOLN: 15.0000 mL | Freq: Once | OROMUCOSAL | Status: AC

## 2019-12-16 MED ORDER — LACTATED RINGERS IV SOLN: INTRAVENOUS | Status: DC

## 2019-12-16 MED ORDER — DEXMEDETOMIDINE HCL 200 MCG/2ML IV SOLN
INTRAVENOUS | Status: DC | PRN
  Administered 2019-12-16: 20 ug via INTRAVENOUS

## 2019-12-16 MED ORDER — CEFAZOLIN SODIUM-DEXTROSE 1-4 GM/50ML-% IV SOLN
1.0000 g | INTRAVENOUS | Status: AC
  Administered 2019-12-16: 1 g via INTRAVENOUS

## 2019-12-16 MED ORDER — LIDOCAINE HCL 1 % IJ SOLN
INTRAMUSCULAR | Status: DC | PRN
  Administered 2019-12-16: 30 mL

## 2019-12-16 MED ORDER — ACETAMINOPHEN 10 MG/ML IV SOLN
INTRAVENOUS | Status: DC | PRN
Start: 2019-12-16 — End: 2019-12-16
  Administered 2019-12-16: 1000 mg via INTRAVENOUS

## 2019-12-16 MED ORDER — SODIUM CHLORIDE 0.9 % IV SOLN: INTRAVENOUS | Status: DC

## 2019-12-16 MED ORDER — PROPOFOL 10 MG/ML IV BOLUS
INTRAVENOUS | Status: AC
  Filled 2019-12-16: qty 60

## 2019-12-16 MED ORDER — PHENYLEPHRINE HCL (PRESSORS) 10 MG/ML IV SOLN
INTRAVENOUS | Status: DC | PRN
  Administered 2019-12-16 (×2): 100 ug via INTRAVENOUS

## 2019-12-16 SURGICAL SUPPLY — 23 items
ADH SKN CLS APL DERMABOND .7 (GAUZE/BANDAGES/DRESSINGS) ×1
CEMENT KYPHON CX01A KIT/MIXER (Cement) ×3 IMPLANT
COVER WAND RF STERILE (DRAPES) ×3 IMPLANT
DERMABOND ADVANCED (GAUZE/BANDAGES/DRESSINGS) ×2
DERMABOND ADVANCED .7 DNX12 (GAUZE/BANDAGES/DRESSINGS) ×1 IMPLANT
DEVICE BIOPSY BONE KYPH (INSTRUMENTS) ×3 IMPLANT
DEVICE BIOPSY BONE KYPHX (INSTRUMENTS) IMPLANT
DRAPE C-ARM XRAY 36X54 (DRAPES) ×3 IMPLANT
DURAPREP 26ML APPLICATOR (WOUND CARE) ×3 IMPLANT
GLOVE SURG SYN 9.0  PF PI (GLOVE) ×2
GLOVE SURG SYN 9.0 PF PI (GLOVE) ×1 IMPLANT
GOWN SRG 2XL LVL 4 RGLN SLV (GOWNS) ×1 IMPLANT
GOWN STRL NON-REIN 2XL LVL4 (GOWNS) ×3
GOWN STRL REUS W/ TWL LRG LVL3 (GOWN DISPOSABLE) ×1 IMPLANT
GOWN STRL REUS W/TWL LRG LVL3 (GOWN DISPOSABLE) ×3
PACK KYPHOPLASTY (MISCELLANEOUS) ×3 IMPLANT
RENTAL RFA  GENERATOR (MISCELLANEOUS)
RENTAL RFA GENERATOR (MISCELLANEOUS) IMPLANT
STRAP SAFETY 5IN WIDE (MISCELLANEOUS) ×3 IMPLANT
SWABSTK COMLB BENZOIN TINCTURE (MISCELLANEOUS) ×3 IMPLANT
TRAY KYPHOPAK 15/2 EXPRESS (KITS) ×3 IMPLANT
TRAY KYPHOPAK 15/3 EXPRESS 1ST (MISCELLANEOUS) IMPLANT
TRAY KYPHOPAK 20/3 EXPRESS 1ST (MISCELLANEOUS) IMPLANT

## 2019-12-16 NOTE — OR Nursing (Signed)
Discharge pending transportation

## 2019-12-16 NOTE — Op Note (Signed)
Date 12/16/19  Time 4:15 pm   PATIENT: Roberta [name]   PRE-OPERATIVE DIAGNOSIS:  closed wedge compression fracture of T6   POST-OPERATIVE DIAGNOSIS:  closed wedge compression fracture of T6   PROCEDURE:  Procedure(s): KYPHOPLASTY T6  SURGEON: Laurene Footman, MD   ASSISTANTS: None   ANESTHESIA:   local and MAC   EBL:  No intake/output data recorded.   BLOOD ADMINISTERED:none   DRAINS: none    LOCAL MEDICATIONS USED:  MARCAINE    and XYLOCAINE    SPECIMEN:  T6 vertebral body biopsy   DISPOSITION OF SPECIMEN: pathology   COUNTS:  YES   TOURNIQUET:  * No tourniquets in log *   IMPLANTS: Bone cement   DICTATION: .Dragon Dictation  patient was brought to the operating room and after adequate anesthesia was obtained the patient was placed prone.  C arm was brought in in good visualization of the affected level obtained on both AP and lateral projections.  After patient identification and timeout procedures were completed, local anesthetic was infiltrated with 10 cc 1% Xylocaine infiltrated subcutaneously.  This is done the area on the each side of the planned approach.  The back was then prepped and draped in the usual sterile manner and repeat timeout procedure carried out.  A spinal needle was brought down to the pedicle on the each side of T6 and a 50-50 mix of 1% Xylocaine half percent Sensorcaine with epinephrine total of 20 cc injected on each side.  After allowing this to set a small incision was made and the trocar was advanced into the vertebral body in an extrapedicular fashion.  Biopsy was obtained  Drilling was carried out balloon inserted with inflation to 1.5 cc on the right and 1 cc on the left.  When the cement was appropriate consistency 1.5 cc were injected on the right and 2 cc on the left into the vertebral body without extravasation, good fill superior to inferior endplates and from right to left sides along the inferior endplate.  After the cement had set the  trochar was removed and permanent C-arm views obtained.  The wound was closed with Dermabond followed by [facility]: d/c to home   PATIENT DISPOSITION:  PACU - hemodynamically stable.

## 2019-12-16 NOTE — Anesthesia Preprocedure Evaluation (Signed)
Anesthesia Evaluation  Patient identified by MRN, date of birth, ID band Patient awake    Reviewed: Allergy & Precautions, NPO status , Patient's Chart, lab work & pertinent test results  Airway Mallampati: III       Dental  (+) Poor Dentition, Missing, Dental Advidsory Given   Pulmonary shortness of breath and with exertion, neg sleep apnea, COPD,  COPD inhaler, neg recent URI, Current Smoker and Patient abstained from smoking.,           Cardiovascular (-) hypertension(-) angina+ Peripheral Vascular Disease  (-) Past MI and (-) CHF (-) dysrhythmias (-) Valvular Problems/Murmurs     Neuro/Psych neg Seizures negative neurological ROS     GI/Hepatic negative GI ROS, Neg liver ROS, neg GERD  ,  Endo/Other  negative endocrine ROSneg diabetes  Renal/GU negative Renal ROS     Musculoskeletal   Abdominal   Peds  Hematology  (+) Blood dyscrasia, anemia ,   Anesthesia Other Findings Past Medical History: No date: AAA (abdominal aortic aneurysm) (HCC) No date: Anemia 2002: Breast cancer ([place])     Comment:  left No date: COPD (chronic obstructive pulmonary disease) (HCC) No date: Dyspnea No date: Headache No date: High cholesterol No date: Personal history of chemotherapy No date: Small cell lung cancer (HCC) No date: Tobacco abuse   Reproductive/Obstetrics                             Anesthesia Physical  Anesthesia Plan  ASA: III  Anesthesia Plan: General   Post-op Pain Management:    Induction: Intravenous  PONV Risk Score and Plan: 2 and Propofol infusion and TIVA  Airway Management Planned: Natural Airway  Additional Equipment:   Intra-op Plan:   Post-operative Plan:   Informed Consent: I have reviewed the patients History and Physical, chart, labs and discussed the procedure including the risks, benefits and alternatives for the proposed anesthesia with the patient or  authorized representative who has indicated his/her understanding and acceptance.       Plan Discussed with:   Anesthesia Plan Comments:         Anesthesia Quick Evaluation

## 2019-12-16 NOTE — Anesthesia Postprocedure Evaluation (Signed)
Anesthesia Post Note  Patient: Roberta [name]  Procedure(s) Performed: T6 KYPHOPLASTY (N/A Spine Thoracic)  Patient location during evaluation: PACU Anesthesia Type: General Level of consciousness: awake and alert Pain management: pain level controlled Vital Signs Assessment: post-procedure vital signs reviewed and stable Respiratory status: spontaneous breathing, nonlabored ventilation, respiratory function stable and patient connected to nasal cannula oxygen Cardiovascular status: blood pressure returned to baseline and stable Postop Assessment: no apparent nausea or vomiting Anesthetic complications: no   No complications documented.   Last Vitals:  Vitals:   12/16/19 1650 12/16/19 1659  BP: 114/60 116/71  Pulse: 78 86  Resp: 17 16  Temp: (!) 36.1 C (!) 36.4 C  SpO2: 97% 98%    Last Pain:  Vitals:   12/16/19 1659  TempSrc: Temporal  PainSc: 0-No pain                 Arita Miss

## 2019-12-16 NOTE — Discharge Instructions (Addendum)
AMBULATORY SURGERY  DISCHARGE INSTRUCTIONS   1) The drugs that you were given will stay in your system until tomorrow so for the next 24 hours you should not:  A) Drive an automobile B) Make any legal decisions C) Drink any alcoholic beverage   2) You may resume regular meals tomorrow.  Today it is better to start with liquids and gradually work up to solid foods.  You may eat anything you prefer, but it is better to start with liquids, then soup and crackers, and gradually work up to solid foods.   3) Please notify your doctor immediately if you have any unusual bleeding, trouble breathing, redness and pain at the surgery site, drainage, fever, or pain not relieved by medication. 4)   5) Your post-operative visit with Dr.                                     is: Date:                        Time:    Please call to schedule your post-operative visit.  6) Additional Instructions:    Take it easy today and tomorrow without a lifting or bending at the waist. Try to walk is much as you can. Band-Aids come off on Thursday then okay to shower. Pain medicine as directed. Call office if having problems. [phone]

## 2019-12-16 NOTE — Transfer of Care (Signed)
Immediate Anesthesia Transfer of Care Note  Patient: Roberta [name]  Procedure(s) Performed: T6 KYPHOPLASTY (N/A Spine Thoracic)  Patient Location: PACU  Anesthesia Type:General  Level of Consciousness: drowsy  Airway & Oxygen Therapy: Patient Spontanous Breathing and Patient connected to face mask oxygen  Post-op Assessment: Report given to RN and Post -op Vital signs reviewed and stable  Post vital signs: Reviewed and stable  Last Vitals:  Vitals Value Taken Time  BP 100/59 12/16/19 1623  Temp    Pulse 96 12/16/19 1625  Resp 30 12/16/19 1625  SpO2 95 % 12/16/19 1625  Vitals shown include unvalidated device data.  Last Pain:  Vitals:   12/16/19 1220  TempSrc: Temporal  PainSc: 5          Complications: No complications documented.

## 2019-12-16 NOTE — OR Nursing (Signed)
Instructed in the use of incentive spirometer with return deminstration.

## 2019-12-17 ENCOUNTER — Encounter: Payer: Self-pay | Admitting: Orthopedic Surgery

## 2019-12-18 LAB — SURGICAL PATHOLOGY

## 2020-01-09 ENCOUNTER — Other Ambulatory Visit: Payer: Self-pay | Admitting: Hematology and Oncology

## 2020-01-12 ENCOUNTER — Other Ambulatory Visit: Payer: Self-pay | Admitting: Hematology and Oncology

## 2020-02-12 ENCOUNTER — Other Ambulatory Visit: Payer: Self-pay

## 2020-02-12 ENCOUNTER — Ambulatory Visit
Admission: RE | Admit: 2020-02-12 | Discharge: 2020-02-12 | Disposition: A | Discharge status: Home / Self Care | Payer: Medicare HMO | Source: Ambulatory Visit | Attending: Hematology and Oncology | Admitting: Hematology and Oncology

## 2020-02-12 ENCOUNTER — Ambulatory Visit
Admission: RE | Admit: 2020-02-12 | Discharge: 2020-02-12 | Disposition: A | Discharge status: Home / Self Care | Payer: Medicare HMO | Attending: Hematology and Oncology | Admitting: Hematology and Oncology

## 2020-02-12 ENCOUNTER — Encounter: Payer: Self-pay | Admitting: Hematology and Oncology

## 2020-02-12 ENCOUNTER — Inpatient Hospital Stay: Payer: Medicare HMO

## 2020-02-12 ENCOUNTER — Telehealth: Payer: Self-pay

## 2020-02-12 ENCOUNTER — Inpatient Hospital Stay: Payer: Medicare HMO | Attending: Hematology and Oncology | Admitting: Hematology and Oncology

## 2020-02-12 VITALS — BP 114/61 | HR 90 | Temp 97.9°F | Resp 18 | Ht 66.0 in | Wt 131.2 lb

## 2020-02-12 DIAGNOSIS — Z853 Personal history of malignant neoplasm of breast: Secondary | ICD-10-CM | POA: Insufficient documentation

## 2020-02-12 DIAGNOSIS — R059 Cough, unspecified: Secondary | ICD-10-CM | POA: Insufficient documentation

## 2020-02-12 DIAGNOSIS — R05 Cough: Secondary | ICD-10-CM | POA: Insufficient documentation

## 2020-02-12 DIAGNOSIS — C3411 Malignant neoplasm of upper lobe, right bronchus or lung: Secondary | ICD-10-CM

## 2020-02-12 DIAGNOSIS — R634 Abnormal weight loss: Secondary | ICD-10-CM | POA: Insufficient documentation

## 2020-02-12 DIAGNOSIS — S22050D Wedge compression fracture of T5-T6 vertebra, subsequent encounter for fracture with routine healing: Secondary | ICD-10-CM | POA: Insufficient documentation

## 2020-02-12 DIAGNOSIS — Z9012 Acquired absence of left breast and nipple: Secondary | ICD-10-CM | POA: Insufficient documentation

## 2020-02-12 DIAGNOSIS — Z9221 Personal history of antineoplastic chemotherapy: Secondary | ICD-10-CM | POA: Diagnosis not present

## 2020-02-12 DIAGNOSIS — Z800 Family history of malignant neoplasm of digestive organs: Secondary | ICD-10-CM | POA: Insufficient documentation

## 2020-02-12 DIAGNOSIS — M810 Age-related osteoporosis without current pathological fracture: Secondary | ICD-10-CM | POA: Diagnosis not present

## 2020-02-12 DIAGNOSIS — F17210 Nicotine dependence, cigarettes, uncomplicated: Secondary | ICD-10-CM | POA: Insufficient documentation

## 2020-02-12 DIAGNOSIS — S22050S Wedge compression fracture of T5-T6 vertebra, sequela: Secondary | ICD-10-CM

## 2020-02-12 DIAGNOSIS — C3490 Malignant neoplasm of unspecified part of unspecified bronchus or lung: Secondary | ICD-10-CM | POA: Insufficient documentation

## 2020-02-12 DIAGNOSIS — S22050A Wedge compression fracture of T5-T6 vertebra, initial encounter for closed fracture: Secondary | ICD-10-CM

## 2020-02-12 DIAGNOSIS — Z7189 Other specified counseling: Secondary | ICD-10-CM

## 2020-02-12 LAB — CBC WITH DIFFERENTIAL/PLATELET
Abs Immature Granulocytes: 0.08 10*3/uL — ABNORMAL HIGH (ref 0.00–0.07)
Basophils Absolute: 0 10*3/uL (ref 0.0–0.1)
Basophils Relative: 0 %
Eosinophils Absolute: 0.1 10*3/uL (ref 0.0–0.5)
Eosinophils Relative: 1 %
HCT: 41.2 % (ref 36.0–46.0)
Hemoglobin: 13.4 g/dL (ref 12.0–15.0)
Immature Granulocytes: 1 %
Lymphocytes Relative: 19 %
Lymphs Abs: 1.8 10*3/uL (ref 0.7–4.0)
MCH: 30 pg (ref 26.0–34.0)
MCHC: 32.5 g/dL (ref 30.0–36.0)
MCV: 92.2 fL (ref 80.0–100.0)
Monocytes Absolute: 0.7 10*3/uL (ref 0.1–1.0)
Monocytes Relative: 8 %
Neutro Abs: 6.6 10*3/uL (ref 1.7–7.7)
Neutrophils Relative %: 71 %
Platelets: 363 10*3/uL (ref 150–400)
RBC: 4.47 MIL/uL (ref 3.87–5.11)
RDW: 14.4 % (ref 11.5–15.5)
WBC: 9.4 10*3/uL (ref 4.0–10.5)
nRBC: 0 % (ref 0.0–0.2)

## 2020-02-12 LAB — COMPREHENSIVE METABOLIC PANEL
ALT: 12 U/L (ref 0–44)
AST: 19 U/L (ref 15–41)
Albumin: 3.7 g/dL (ref 3.5–5.0)
Alkaline Phosphatase: 78 U/L (ref 38–126)
Anion gap: 10 (ref 5–15)
BUN: 13 mg/dL (ref 8–23)
CO2: 28 mmol/L (ref 22–32)
Calcium: 8.9 mg/dL (ref 8.9–10.3)
Chloride: 99 mmol/L (ref 98–111)
Creatinine, Ser: 0.96 mg/dL (ref 0.44–1.00)
GFR calc Af Amer: >60 mL/min (ref >60)
GFR calc non Af Amer: 59 mL/min — ABNORMAL LOW (ref >60)
Glucose, Bld: 94 mg/dL (ref 70–99)
Potassium: 4.4 mmol/L (ref 3.5–5.1)
Sodium: 137 mmol/L (ref 135–145)
Total Bilirubin: 0.5 mg/dL (ref 0.3–1.2)
Total Protein: 7.1 g/dL (ref 6.5–8.1)

## 2020-02-12 LAB — EXPECTORATED SPUTUM ASSESSMENT W GRAM STAIN, RFLX TO RESP C

## 2020-02-12 MED ORDER — HEPARIN SOD (PORK) LOCK FLUSH 100 UNIT/ML IV SOLN
500.0000 [IU] | Freq: Once | INTRAVENOUS | Status: AC
  Administered 2020-02-12: 500 [IU] via INTRAVENOUS
  Filled 2020-02-12: qty 5

## 2020-02-12 MED ORDER — SODIUM CHLORIDE 0.9% FLUSH
10.0000 mL | Freq: Once | INTRAVENOUS | Status: AC
  Administered 2020-02-12: 10 mL via INTRAVENOUS
  Filled 2020-02-12: qty 10

## 2020-02-12 NOTE — Progress Notes (Signed)
No new changes noted today 

## 2020-02-12 NOTE — Telephone Encounter (Signed)
Left a message to inform the patient that her CXR was stable and if she has any other question please feel free to contact the office.

## 2020-02-12 NOTE — Telephone Encounter (Signed)
-----   Message from Lequita Asal, MD sent at 02/12/2020 12:12 PM EDT ----- Regarding: Please call patient  CXR stable.  M ----- Message ----- From: Interface, Rad Results In Sent: 02/12/2020  11:40 AM EDT To: Lequita Asal, MD

## 2020-02-12 NOTE — Progress Notes (Signed)
[facility]  [address], [address] [place], [place] [address] Phone: [phone]  Fax: [phone]   Clinic Day:  02/12/2020  Referring physician: Gayland Curry, MD  Chief Complaint: Roberta [name] is a 74 y.o. female with  limited stage small cell lung cancer who is seen for 3 month assessment.    HPI: The patient was last seen in the medical oncology clinic on 11/12/2019. At that time, she was doing well.  She noted left sided chest burning and tingling.  Exam revealed no rash or palpable abnormality. Chest CT on 11/03/2019 revealed no evidence of recurrent disease.  Bone scan on 11/10/2019 revealed no evidence of metastatic disease.  She was referred to Dr Theodore Demark office for evaluation of a T5 superior endplate compression fracture.  Surveillance continued.  She underwent T6 kyphoplasty on 12/16/2019 by Dr Rudene Christians.  During the interim, she has been "ok". She nocturia and a chronic cough with yellow mucous. She denies shortness of breath, nausea, vomiting, diarrhea, pain. Her energy level is okay and she is able to do the things she wants to do. The kyphoplasty got rid of her back pain. She takes calcium and vitamin D supplementation. She has an inhaler that she uses as needed.  Her PCP is Dr. Astrid Divine at [facility] primary care. She is interested in Fosamax. She has not seen a dentist; she only has 3 teeth left.   Past Medical History:  Diagnosis Date  . AAA (abdominal aortic aneurysm) ([place])   . Anemia   . Breast cancer ([place]) 2002   left  . COPD (chronic obstructive pulmonary disease) ([place])   . Dyspnea   . Headache   . High cholesterol   . Personal history of chemotherapy   . Small cell lung cancer ([place])   . Tobacco abuse     Past Surgical History:  Procedure Laterality Date  . ABDOMINAL HYSTERECTOMY    . ARTERY BIOPSY Right 12/05/2017   Procedure: BIOPSY TEMPORAL ARTERY;  Surgeon: Margaretha Sheffield, MD;  Location: [facility] ORS;  Service: ENT;  Laterality:  Right;  . ENDOBRONCHIAL ULTRASOUND N/A 02/19/2017   Procedure: ENDOBRONCHIAL ULTRASOUND;  Surgeon: Flora Lipps, MD;  Location: [facility] ORS;  Service: Cardiopulmonary;  Laterality: N/A;  . ENDOVASCULAR REPAIR/STENT GRAFT N/A 12/06/2016   Procedure: Endovascular Repair/Stent Graft;  Surgeon: Katha Cabal, MD;  Location: [place] [facility];  Service: Cardiovascular;  Laterality: N/A;  . IR FLUORO GUIDE PORT INSERTION RIGHT  03/02/2017  . KYPHOPLASTY N/A 06/11/2018   Procedure: KYPHOPLASTY L4 BIOPSY WITH RFA;  Surgeon: Hessie Knows, MD;  Location: [facility] ORS;  Service: Orthopedics;  Laterality: N/A;  . KYPHOPLASTY N/A 12/16/2019   Procedure: T6 KYPHOPLASTY;  Surgeon: Hessie Knows, MD;  Location: [facility] ORS;  Service: Orthopedics;  Laterality: N/A;  . MASTECTOMY Left 2002   with sentinel node     Family History  Problem Relation Age of Onset  . CAD Mother   . Colon cancer Brother   . Breast cancer Neg Hx     Social History:  reports that she has been smoking cigarettes. She has a 75.00 pack-year smoking history. She has never used smokeless tobacco. She reports that she does not drink alcohol and does not use drugs. 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 The patient's sister's name is [name]. She lives  with her daughter. The patient is alone today.  Allergies: No Known Allergies  Current Medications: Current Outpatient Medications  Medication Sig Dispense Refill  . acetaminophen (TYLENOL) 325 MG tablet Take 650 mg by mouth every 6 (six) hours as needed for mild pain.     Marland Kitchen albuterol (PROVENTIL HFA;VENTOLIN HFA) 108 (90 Base) MCG/ACT inhaler Inhale 2 puffs into the lungs every 6 (six) hours as needed for wheezing or shortness of breath. 1 Inhaler 1  . atorvastatin (LIPITOR) 10 MG tablet Take 10 mg by mouth at bedtime.     . fluticasone-salmeterol  (ADVAIR HFA) 115-21 MCG/ACT inhaler Inhale 2 puffs into the lungs 2 (two) times daily.    Marland Kitchen HYDROcodone-acetaminophen (NORCO) 5-325 MG tablet Take 1 tablet by mouth every 6 (six) hours as needed for moderate pain. 15 tablet 0  . lidocaine-prilocaine (EMLA) cream Apply 1 application topically as needed. (Patient taking differently: Apply 1 application topically as needed (port access). ) 30 g 2  . Multiple Vitamin (MULTIVITAMIN) tablet Take 1 tablet by mouth daily.     No current facility-administered medications for this visit.   Facility-Administered Medications Ordered in Other Visits  Medication Dose Route Frequency Provider Last Rate Last Admin  . sodium chloride flush (NS) 0.9 % injection 10 mL  10 mL Intravenous PRN Sindy Guadeloupe, MD   10 mL at 03/26/17 0948  . sodium chloride flush (NS) 0.9 % injection 10 mL  10 mL Intravenous PRN Sindy Guadeloupe, MD   10 mL at 04/16/17 1006  . sodium chloride flush (NS) 0.9 % injection 10 mL  10 mL Intravenous PRN Sindy Guadeloupe, MD   10 mL at 01/10/18 1410  . sodium chloride flush (NS) 0.9 % injection 10 mL  10 mL Intravenous PRN Lequita Asal, MD   10 mL at 12/11/18 1033    Review of Systems  Constitutional: Negative for chills, diaphoresis, fever, malaise/fatigue and weight loss (stable).       Feels "ok".  HENT: Positive for hearing loss. Negative for congestion, ear discharge, ear pain, nosebleeds, sinus pain, sore throat and tinnitus.   Eyes: Negative.  Negative for blurred vision, double vision and photophobia.  Respiratory: Positive for cough (chronic) and sputum production (yellow ). Negative for hemoptysis and shortness of breath.        COPD.  Smokes half a pack a day.  Cardiovascular: Negative for chest pain, palpitations, orthopnea, leg swelling and PND.  Gastrointestinal: Negative.  Negative for abdominal pain, blood in stool, constipation, diarrhea, heartburn, melena, nausea and vomiting.  Genitourinary: Negative for dysuria,  frequency, hematuria and urgency.       Nocturia  Musculoskeletal: Negative for back pain (s/p kyphoplasty), falls, joint pain and myalgias.  Skin: Negative.  Negative for itching and rash.  Neurological: Negative for dizziness, tingling, tremors, sensory change, speech change, focal weakness, weakness and headaches.  Endo/Heme/Allergies: Negative.  Negative for environmental allergies. Does not bruise/bleed easily.  Psychiatric/Behavioral: Negative for depression and memory loss. The patient is not nervous/anxious and does not have insomnia.   All other systems reviewed and are negative.  Performance status (ECOG): 1  Vitals Blood pressure 114/61, pulse 90, temperature 97.9 F (36.6 C), temperature source Tympanic, resp. rate 18, height 5\' 6"  (1.676 m), weight 131 lb 2.8 oz (59.5 kg), SpO2 99 %.   Physical Exam Vitals and nursing note reviewed.  Constitutional:      General: She is not in acute distress.    Appearance: She is  well-developed. She is not diaphoretic.     Comments: Thin woman sitting comfortably in the exam room in no acute distress.  HENT:     Head: Normocephalic and atraumatic.     Mouth/Throat:     Mouth: Mucous membranes are moist.     Pharynx: Oropharynx is clear. No oropharyngeal exudate.     Comments: 3 teeth and part of a root on the bottom. Eyes:     General: No scleral icterus.    Extraocular Movements: Extraocular movements intact.     Conjunctiva/sclera: Conjunctivae normal.     Pupils: Pupils are equal, round, and reactive to light.  Neck:     Vascular: No JVD.  Cardiovascular:     Rate and Rhythm: Normal rate and regular rhythm.     Heart sounds: Normal heart sounds. No murmur heard.  No friction rub. No gallop.   Pulmonary:     Effort: No respiratory distress.     Breath sounds: Wheezing present. No rales.     Comments: Initially had extra sounds, cleared with deep breaths. Chest:     Chest wall: No tenderness.     Comments: Port-a-Cath in  place. Abdominal:     General: Bowel sounds are normal. There is no distension.     Palpations: Abdomen is soft. There is no hepatomegaly, splenomegaly or mass.     Tenderness: There is no abdominal tenderness. There is no guarding or rebound.  Musculoskeletal:        General: No tenderness. Normal range of motion.     Cervical back: Normal range of motion and neck supple.  Lymphadenopathy:     Head:     Right side of head: No preauricular, posterior auricular or occipital adenopathy.     Left side of head: No preauricular, posterior auricular or occipital adenopathy.     Cervical: No cervical adenopathy.     Upper Body:     Right upper body: No supraclavicular or axillary adenopathy.     Left upper body: No supraclavicular or axillary adenopathy.     Lower Body: No right inguinal adenopathy. No left inguinal adenopathy.  Skin:    General: Skin is warm and dry.     Coloration: Skin is not pale.     Findings: No erythema or rash.  Neurological:     Mental Status: She is alert and oriented to person, place, and time.  Psychiatric:        Behavior: Behavior normal.        Thought Content: Thought content normal.        Judgment: Judgment normal.    Imaging studies: 05/30/2017:  Chest, abdomen, and pelvic CTrevealed continued improvement in the right upper lobe mass, right hilar and mediastinal lymphadenopathy consistent with response to therapy. there was interval development of multiple small ill-defined peribronchovascular nodules in both lungs, greatest in the right upper lobe. Some of these were clustered, and were not associated with the bronchovascular bundles such that lymphangitic tumor was unlikely. These were likely inflammatory. There was no evidence of metastatic disease in the abdomen or pelvis 06/07/2018:  Chest, abdomen, and pelvic CTon 06/07/2018 revealed interval development of L4 vertebral body burst fracturewith mild retropulsion of the posterior aspect of the  vertebral body. There was associated sclerosis of the L4 vertebral body with significant height loss predominately involving the anterior and mid aspect of the vertebral body. Possibility of pathologic fracture was not excluded. There was a nondisplacedright L4 transverse process fracture. Recommend further evaluation  with lumbar spine MRI. There were findings suggestive of mild acute sigmoid colonic diverticulitis. There was similar to mild interval increase in size of previously described lesion within the right upper lobe (3.1 x 1.8 cm to 3.2 x 2.2 cm). Adjacent irregular nodulealong the inferior margin within the right upper lobe was grossly similar when compared to prior (1.9 x 1.1 cm to 1.2 x 2.0 cm). Additional areas of consolidation within the right lung were grossly similar. There was interval improvement in previously described tree-in-bud nodularity within the left lower lobe with slight interval increase in ground-glass nodularity within the right lower lobe suggestive of atypical infectious process (MAI). 08/12/2018:  PETscanrevealed a 3.1 x 3.2 focus area of opacity in the RIGHTupper lobe (SUV 2.0). Additionally, there was a 1.9 x 2.2 cm perihilar RIGHTlung lesion extending into the superior aspect of the RIGHThilum (SUV 1.8). Both areas considered to be infectious/inflammatory, however low-grade neoplasm cannot be excluded. Short-term follow-up recommended. 11/12/2018:  ChestCTrevealed expected evolution of post radiation pneumonitis/fibrosis in the right suprahilar region.There was no evidence of local recurrence or metastatic disease.There was no adenopathy. There was aorticatherosclerosis. 05/05/2019:  Chest, abdomen, and pelvis CTrevealed evolving postradiation mass-like fibrosis in the upper right lung, similar to the prior study.There was slight enlargement of small right pleural effusion.There were no definitive findings to suggest locally recurrent disease or  metastatic disease in the chest, abdomen or pelvis.There was a new compression fractureof superior endplate of [id number] 33% loss of anterior vertebral body height, favored to be a benign compression fracture. There was severe colonic diverticulosis without evidence of acute diverticulitis, aortic atherosclerosis, in addition to left main and 3 vessel coronary artery disease. Therewere calcifications of the aortic valve and mitral annulus.There was diffuse bronchial wall thickening with very mild centrilobular and mild paraseptal emphysema; imaging findings suggestive of underlying COPD. 11/03/2018:  Chest CT revealed stable appearance of RIGHT upper lobe post radiation fibrosis and bronchiectatic change with similar appearance of consolidative changes and pleural-parenchymal scarring emanating from the RIGHT hilum extending to the periphery. There was a small RIGHT-sided pleural effusion is slightly larger (12 mm greatest depth previously 6 mm).  There was sclerosis of inferior endplate of T6 with slight interval loss of height at this level as compared to the previous study overall approximately 10-20% loss of height. This may represent a subacute fracture. Correlate with any acute pain in this location. There was a similar appearance of T5 compression fracture. She is s/p LEFT mastectomy.  She has three-vessel coronary artery disease. 11/10/2019:  Bone scan revealed increased activity noted over the midthoracic spine and lower lumbar spine corresponding to previously identified compression fractures. There was no other abnormality noted to suggest metastatic disease. Left ribs were unremarkable.   Appointment on 02/12/2020  Component Date Value Ref Range Status  . WBC 02/12/2020 9.4  4.0 - 10.5 K/uL Final  . RBC 02/12/2020 4.47  3.87 - 5.11 MIL/uL Final  . Hemoglobin 02/12/2020 13.4  12.0 - 15.0 g/dL Final  . HCT 02/12/2020 41.2  36 - 46 % Final  . MCV 02/12/2020 92.2  80.0 - 100.0 fL Final  . MCH  02/12/2020 30.0  26.0 - 34.0 pg Final  . MCHC 02/12/2020 32.5  30.0 - 36.0 g/dL Final  . RDW 02/12/2020 14.4  11.5 - 15.5 % Final  . Platelets 02/12/2020 363  150 - 400 K/uL Final  . nRBC 02/12/2020 0.0  0.0 - 0.2 % Final  . Neutrophils Relative % 02/12/2020 71  %  Final  . Neutro Abs 02/12/2020 6.6  1.7 - 7.7 K/uL Final  . Lymphocytes Relative 02/12/2020 19  % Final  . Lymphs Abs 02/12/2020 1.8  0.7 - 4.0 K/uL Final  . Monocytes Relative 02/12/2020 8  % Final  . Monocytes Absolute 02/12/2020 0.7  0 - 1 K/uL Final  . Eosinophils Relative 02/12/2020 1  % Final  . Eosinophils Absolute 02/12/2020 0.1  0 - 0 K/uL Final  . Basophils Relative 02/12/2020 0  % Final  . Basophils Absolute 02/12/2020 0.0  0 - 0 K/uL Final  . Immature Granulocytes 02/12/2020 1  % Final  . Abs Immature Granulocytes 02/12/2020 0.08* 0.00 - 0.07 K/uL Final   Performed at [facility], [address], [place], [place] [address]    Assessment:  Roberta [name] is a 74 y.o. female with limited stage small cell lung cancer. She had stage IIIB (T3N2M0) disease. She received cisplatin and etoposidefrom 03/05/2017 - 05/14/2017. She received radiationfrom 03/08/2017 - 04/23/2017.  She received PCIfrom 06/19/2017 - 07/11/2017.  Chest, abdomen, and pelvic CTon 06/07/2018 revealed interval development of L4 vertebral body burst fracturewith mild retropulsion of the posterior aspect of the vertebral body. There was associated sclerosis of the L4 vertebral body with significant height loss predominately involving the anterior and mid aspect of the vertebral body. Possibility of pathologic fracture was not excluded. There was a nondisplacedright L4 transverse process fracture. Recommend further evaluation with lumbar spine MRI. There were findings suggestive of mild acute sigmoid colonic diverticulitis. There was similar to mild interval increase in size of previously described lesion within the right upper  lobe (3.1 x 1.8 cm to 3.2 x 2.2 cm). Adjacent irregular nodulealong the inferior margin within the right upper lobe was grossly similar when compared to prior (1.9 x 1.1 cm to 1.2 x 2.0 cm). Additional areas of consolidation within the right lung were grossly similar. There was interval improvement in previously described tree-in-bud nodularity within the left lower lobe with slight interval increase in ground-glass nodularity within the right lower lobe suggestive of atypical infectious process (MAI).  Chest, abdomen, and pelvis CTon 05/05/2019 revealed evolving postradiation mass-like fibrosis in the upper right lung, similar to the prior study.There was slight enlargement of small right pleural effusion.There were no definitive findings to suggest locally recurrent disease or metastatic disease in the chest, abdomen or pelvis.There was a new compression fractureof superior endplate of [id number] 94% loss of anterior vertebral body height, favored to be a benign compression fracture. There was severe colonic diverticulosis without evidence of acute diverticulitis, aortic atherosclerosis, in addition to left main and 3 vessel coronary artery disease. Therewere calcifications of the aortic valve and mitral annulus.There was diffuse bronchial wall thickening with very mild centrilobular and mild paraseptal emphysema; imaging findings suggestive of underlying COPD.  Chest CT on 11/03/2019 revealed stable appearance of RIGHT upper lobe post radiation fibrosis and bronchiectatic change with similar appearance of consolidative changes and pleural-parenchymal scarring emanating from the RIGHT hilum extending to the periphery. There was a small RIGHT-sided pleural effusion is slightly larger (12 mm greatest depth previously 6 mm).  There was sclerosis of inferior endplate of T6 with slight interval loss of height at this level as compared to the previous study overall approximately 10-20% loss of height. This  may represent a subacute fracture. Correlate with any acute pain in this location. There was a similar appearance of T5 compression fracture. She is s/p LEFT mastectomy.  She has three-vessel coronary artery  disease.  Bone scan on 11/10/2019 revealed increased activity noted over the midthoracic spine and lower lumbar spine corresponding to previously identified compression fractures. There was no other abnormality noted to suggest metastatic disease. Left ribs were unremarkable.  CEA has been followed: 2.3 on 07/10/2018, 2.2 on 11/02/2018, and 2.9 on 11/03/2019.  She underwent L4 kyphoplasty with radiofrequency ablation on 06/11/2018. Pathologyrevealed viable bone and trilineage metabolic marrow with reactive changes and focal changes of fracture callus. IHC stains for pancytokeratin were negative. There was no evidence of metastatic disease.  She underwent T6 kyphoplasty on 12/16/2019.  Bone density on 05/21/2019 revealed osteoporosis with a T-score of -3.0 in the left femoral hip and -2.4 in the left forearm radius.  Bone density on 05/21/2019 showed osteoporosis with a T-score of -3.0 in the total left femur.   She has a history of left breast cancerin 2002. She underwent left mastectomy followed by adjuvant chemotherapy. She was treated by Dr. Oliva Bustard. She did not receive adjuvant radiation.  CA27.29 was 16.0 on 07/29/2019.  Symptomatically, she is doing well.  She has a chronic cough with yellow mucus.  Exam is stable.  Plan: 1.   Labs today:  CBC with diff, CMP. 2.   Limited stage SCLC Clinically, she continues to do well. Exam feels no evidence of recurrent disease. Chest CT on 11/03/2019 revealed no evidence of recurrent disease. Bone scan on 11/10/2019 revealed no evidence of metastatic disease. Continue surveillance imaging every 6 months. 2.L4 burst fractures/pkyphoplasty She denies any back pain. 3.T5 superior endplate compression fracture She  underwent T6 kyphoplasty on 12/16/2019.  She denies any back pain. 4.   Weight loss  Weight is stable.  Continue to monitor. 5. Osteoporosis  Patient interested in Fosamax.  Patient notes only 3 lower teeth with another one embedded.  Discuss follow-up with Dr. Astrid Divine. 6.   Port-a-cath maintenance Port flush every 6-12 weeks. 7.   Cough  Sputum culture.  CXR (PA and lateral). 8.   RTC for labs prior to CT: CBC with diff, CMP. 9.   Chest, abdomen, and pelvis CT on 05/04/2020. 10.   RTC after CT scan for MD assessment and review of labs and imaging studies.  Addendum:  CXR revealed no acute cardiopulmonary abnormality.  I discussed the assessment and treatment plan with the patient.  The patient was provided an opportunity to ask questions and all were answered.  The patient agreed with the plan and demonstrated an understanding of the instructions.  The patient was advised to call back if the symptoms worsen or if the condition fails to improve as anticipated.   Lequita Asal, MD, PhD    02/12/2020, 9:57 AM  I, Mirian Mo Tufford, am acting as a Education administrator for Lequita Asal, MD.  I, [place] Mike Gip, MD, have reviewed the above documentation for accuracy and completeness, and I agree with the above.

## 2020-02-15 LAB — CULTURE, RESPIRATORY W GRAM STAIN: Culture: NORMAL

## 2020-02-22 DIAGNOSIS — C3490 Malignant neoplasm of unspecified part of unspecified bronchus or lung: Secondary | ICD-10-CM | POA: Insufficient documentation

## 2020-05-04 ENCOUNTER — Ambulatory Visit
Admission: RE | Admit: 2020-05-04 | Discharge: 2020-05-04 | Disposition: A | Discharge status: Home / Self Care | Payer: Medicare HMO | Source: Ambulatory Visit | Attending: Hematology and Oncology | Admitting: Hematology and Oncology

## 2020-05-04 ENCOUNTER — Other Ambulatory Visit: Payer: Self-pay

## 2020-05-04 ENCOUNTER — Inpatient Hospital Stay: Payer: Medicare HMO | Attending: Hematology and Oncology

## 2020-05-04 DIAGNOSIS — C3490 Malignant neoplasm of unspecified part of unspecified bronchus or lung: Secondary | ICD-10-CM | POA: Diagnosis not present

## 2020-05-04 DIAGNOSIS — Z9221 Personal history of antineoplastic chemotherapy: Secondary | ICD-10-CM | POA: Insufficient documentation

## 2020-05-04 DIAGNOSIS — R634 Abnormal weight loss: Secondary | ICD-10-CM | POA: Diagnosis not present

## 2020-05-04 DIAGNOSIS — S22050S Wedge compression fracture of T5-T6 vertebra, sequela: Secondary | ICD-10-CM | POA: Diagnosis not present

## 2020-05-04 DIAGNOSIS — S22041S Stable burst fracture of fourth thoracic vertebra, sequela: Secondary | ICD-10-CM | POA: Diagnosis not present

## 2020-05-04 DIAGNOSIS — F17210 Nicotine dependence, cigarettes, uncomplicated: Secondary | ICD-10-CM | POA: Insufficient documentation

## 2020-05-04 DIAGNOSIS — Z923 Personal history of irradiation: Secondary | ICD-10-CM | POA: Diagnosis not present

## 2020-05-04 DIAGNOSIS — R252 Cramp and spasm: Secondary | ICD-10-CM | POA: Diagnosis not present

## 2020-05-04 DIAGNOSIS — Z85118 Personal history of other malignant neoplasm of bronchus and lung: Secondary | ICD-10-CM | POA: Diagnosis present

## 2020-05-04 DIAGNOSIS — C3411 Malignant neoplasm of upper lobe, right bronchus or lung: Secondary | ICD-10-CM

## 2020-05-04 DIAGNOSIS — Z08 Encounter for follow-up examination after completed treatment for malignant neoplasm: Secondary | ICD-10-CM | POA: Insufficient documentation

## 2020-05-04 DIAGNOSIS — Z853 Personal history of malignant neoplasm of breast: Secondary | ICD-10-CM | POA: Diagnosis not present

## 2020-05-04 DIAGNOSIS — M810 Age-related osteoporosis without current pathological fracture: Secondary | ICD-10-CM | POA: Insufficient documentation

## 2020-05-04 DIAGNOSIS — Z800 Family history of malignant neoplasm of digestive organs: Secondary | ICD-10-CM | POA: Insufficient documentation

## 2020-05-04 DIAGNOSIS — Z9012 Acquired absence of left breast and nipple: Secondary | ICD-10-CM | POA: Diagnosis not present

## 2020-05-04 LAB — CBC WITH DIFFERENTIAL/PLATELET
Abs Immature Granulocytes: 0.04 10*3/uL (ref 0.00–0.07)
Basophils Absolute: 0 10*3/uL (ref 0.0–0.1)
Basophils Relative: 1 %
Eosinophils Absolute: 0.1 10*3/uL (ref 0.0–0.5)
Eosinophils Relative: 1 %
HCT: 41.4 % (ref 36.0–46.0)
Hemoglobin: 13 g/dL (ref 12.0–15.0)
Immature Granulocytes: 1 %
Lymphocytes Relative: 21 %
Lymphs Abs: 1.6 10*3/uL (ref 0.7–4.0)
MCH: 29.1 pg (ref 26.0–34.0)
MCHC: 31.4 g/dL (ref 30.0–36.0)
MCV: 92.6 fL (ref 80.0–100.0)
Monocytes Absolute: 0.6 10*3/uL (ref 0.1–1.0)
Monocytes Relative: 7 %
Neutro Abs: 5.3 10*3/uL (ref 1.7–7.7)
Neutrophils Relative %: 69 %
Platelets: 264 10*3/uL (ref 150–400)
RBC: 4.47 MIL/uL (ref 3.87–5.11)
RDW: 13.5 % (ref 11.5–15.5)
WBC: 7.7 10*3/uL (ref 4.0–10.5)
nRBC: 0 % (ref 0.0–0.2)

## 2020-05-04 LAB — COMPREHENSIVE METABOLIC PANEL
ALT: 13 U/L (ref 0–44)
AST: 19 U/L (ref 15–41)
Albumin: 3.4 g/dL — ABNORMAL LOW (ref 3.5–5.0)
Alkaline Phosphatase: 73 U/L (ref 38–126)
Anion gap: 9 (ref 5–15)
BUN: 12 mg/dL (ref 8–23)
CO2: 27 mmol/L (ref 22–32)
Calcium: 8.8 mg/dL — ABNORMAL LOW (ref 8.9–10.3)
Chloride: 100 mmol/L (ref 98–111)
Creatinine, Ser: 0.84 mg/dL (ref 0.44–1.00)
GFR, Estimated: >60 mL/min (ref >60)
Glucose, Bld: 92 mg/dL (ref 70–99)
Potassium: 4.1 mmol/L (ref 3.5–5.1)
Sodium: 136 mmol/L (ref 135–145)
Total Bilirubin: 0.4 mg/dL (ref 0.3–1.2)
Total Protein: 7 g/dL (ref 6.5–8.1)

## 2020-05-04 MED ORDER — HEPARIN SOD (PORK) LOCK FLUSH 100 UNIT/ML IV SOLN
500.0000 [IU] | Freq: Once | INTRAVENOUS | Status: AC
  Administered 2020-05-04: 500 [IU] via INTRAVENOUS
  Filled 2020-05-04: qty 5

## 2020-05-04 MED ORDER — IOHEXOL 300 MG/ML  SOLN
100.0000 mL | Freq: Once | INTRAMUSCULAR | Status: AC | PRN
  Administered 2020-05-04: 100 mL via INTRAVENOUS

## 2020-05-04 MED ORDER — SODIUM CHLORIDE 0.9% FLUSH
10.0000 mL | INTRAVENOUS | Status: DC | PRN
  Administered 2020-05-04: 10 mL via INTRAVENOUS
  Filled 2020-05-04: qty 10

## 2020-05-04 NOTE — Progress Notes (Signed)
[facility]  [address] [place], [place] [address] Phone: [phone]  Fax: [phone]   Clinic Day:  05/05/2020  Referring physician: Gayland Curry, MD  Chief Complaint: Roberta [name] is a 74 y.o. female with  limited stage small cell lung cancer who is seen for 3 month assessment and review of interval CT.  HPI: The patient was last seen in the medical oncology clinic on 02/12/2020. At that time, she was doing well.  She had a chronic cough with yellow mucus.  Exam was stable. Hematocrit was 41.2, hemoglobin 13.4, platelets 363,000, WBC 9,400. CrCl was 59 ml/min. Sputum culture was normal. CXR revealed no acute cardiopulmonary abnormality.  Chest, abdomen, and pelvis CT with contrast on 05/04/2020 revealed stable appearance of the right suprahilar and mild associated perihilar consolidation with associated air bronchograms likely a manifestation of prior radiation therapy. There were no compelling findings of progressive or active malignancy. Other imaging findings of potential clinical significance: Coronary atherosclerosis. There was a patent infrarenal aorta bi-iliac stent graft traversing a small thrombosed abdominal aortic aneurysm. There was mild left renal atrophy. There was descending and sigmoid colon diverticulosis. There was lumbar spondylosis and degenerative disc disease causing impingement at L3-4, L4-5, and L5-S1. There was an old sternal body fracture. There were stable thoracolumbar compression fractures, stable prior vertebral augmentations at T5 and L4. There was aortic atherosclerosis.  During the interim, she has been "ok." She has bad reflux at night when she lays down. She uses two pillows at night but that does not help. She would like a prescription for something. She also had a cough a couple weeks ago that resolved with Robitussin. She reports leg cramps at night.  The patient denies fevers, cough, chest pain, shortness  of breath, abdominal symptoms, and back pain. She smokes half a pack of cigarettes per day.  The patient has not seen a dentist in a long time. She has three teeth remaining.   Past Medical History:  Diagnosis Date  . AAA (abdominal aortic aneurysm) ([place])   . Anemia   . Breast cancer ([place]) 2002   left  . COPD (chronic obstructive pulmonary disease) ([place])   . Dyspnea   . Headache   . High cholesterol   . Personal history of chemotherapy   . Small cell lung cancer ([place])   . Tobacco abuse     Past Surgical History:  Procedure Laterality Date  . ABDOMINAL HYSTERECTOMY    . ARTERY BIOPSY Right 12/05/2017   Procedure: BIOPSY TEMPORAL ARTERY;  Surgeon: Margaretha Sheffield, MD;  Location: [facility] ORS;  Service: ENT;  Laterality: Right;  . ENDOBRONCHIAL ULTRASOUND N/A 02/19/2017   Procedure: ENDOBRONCHIAL ULTRASOUND;  Surgeon: Flora Lipps, MD;  Location: [facility] ORS;  Service: Cardiopulmonary;  Laterality: N/A;  . ENDOVASCULAR REPAIR/STENT GRAFT N/A 12/06/2016   Procedure: Endovascular Repair/Stent Graft;  Surgeon: Katha Cabal, MD;  Location: [place] [facility];  Service: Cardiovascular;  Laterality: N/A;  . IR FLUORO GUIDE PORT INSERTION RIGHT  03/02/2017  . KYPHOPLASTY N/A 06/11/2018   Procedure: KYPHOPLASTY L4 BIOPSY WITH RFA;  Surgeon: Hessie Knows, MD;  Location: [facility] ORS;  Service: Orthopedics;  Laterality: N/A;  . KYPHOPLASTY N/A 12/16/2019   Procedure: T6 KYPHOPLASTY;  Surgeon: Hessie Knows, MD;  Location: [facility] ORS;  Service: Orthopedics;  Laterality: N/A;  . MASTECTOMY Left 2002   with sentinel node     Family History  Problem Relation Age of Onset  . CAD Mother   .  Colon cancer Brother   . Breast cancer Neg Hx     Social History:  reports that she has been smoking cigarettes. She has a 75.00 pack-year smoking history. She has never used smokeless tobacco. She reports that she does not drink alcohol and does not use drugs. 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 The patient's sister's name is Roberta [name]. She lives with her daughter. The patient is alone today.  Allergies: No Known Allergies  Current Medications: Current Outpatient Medications  Medication Sig Dispense Refill  . acetaminophen (TYLENOL) 325 MG tablet Take 650 mg by mouth every 6 (six) hours as needed for mild pain.     Marland Kitchen albuterol (PROVENTIL HFA;VENTOLIN HFA) 108 (90 Base) MCG/ACT inhaler Inhale 2 puffs into the lungs every 6 (six) hours as needed for wheezing or shortness of breath. 1 Inhaler 1  . atorvastatin (LIPITOR) 10 MG tablet Take 10 mg by mouth at bedtime.     . fluticasone-salmeterol (ADVAIR HFA) 115-21 MCG/ACT inhaler Inhale 2 puffs into the lungs 2 (two) times daily.    Marland Kitchen lidocaine-prilocaine (EMLA) cream Apply 1 application topically as needed. (Patient taking differently: Apply 1 application topically as needed (port access). ) 30 g 2  . Multiple Vitamin (MULTIVITAMIN) tablet Take 1 tablet by mouth daily.    Marland Kitchen HYDROcodone-acetaminophen (NORCO) 5-325 MG tablet Take 1 tablet by mouth every 6 (six) hours as needed for moderate pain. (Patient not taking: Reported on 05/05/2020) 15 tablet 0  . omeprazole (PRILOSEC) 20 MG capsule Take 1 capsule (20 mg total) by mouth daily. 30 capsule 1   No current facility-administered medications for this visit.   Facility-Administered Medications Ordered in Other Visits  Medication Dose Route Frequency Provider Last Rate Last Admin  . sodium chloride flush (NS) 0.9 % injection 10 mL  10 mL Intravenous PRN Sindy Guadeloupe, MD   10 mL at 03/26/17 0948  . sodium chloride flush (NS) 0.9 % injection 10 mL  10 mL Intravenous PRN Sindy Guadeloupe, MD   10 mL at 04/16/17 1006  . sodium chloride flush (NS) 0.9 % injection 10 mL  10 mL Intravenous PRN Sindy Guadeloupe, MD   10 mL at 01/10/18 1410  . sodium chloride flush (NS) 0.9 %  injection 10 mL  10 mL Intravenous PRN Lequita Asal, MD   10 mL at 12/11/18 1033    Review of Systems  Constitutional: Negative.  Negative for chills, diaphoresis, fever, malaise/fatigue and weight loss (up 1 lb).       Feels "okay."  HENT: Positive for hearing loss. Negative for congestion, ear discharge, ear pain, nosebleeds, sinus pain, sore throat and tinnitus.   Eyes: Negative.  Negative for blurred vision, double vision and photophobia.  Respiratory: Negative for cough, hemoptysis, sputum production and shortness of breath.        COPD.  Smokes half a pack a day.  Cardiovascular: Negative.  Negative for chest pain, palpitations, orthopnea, leg swelling and PND.  Gastrointestinal: Positive for heartburn. Negative for abdominal pain, blood in stool, constipation, diarrhea, melena, nausea and vomiting.  Genitourinary: Negative for dysuria, frequency, hematuria and urgency.  Musculoskeletal: Positive for myalgias (leg cramps at night). Negative for back pain, falls and joint pain.  Skin: Negative.  Negative for itching and rash.  Neurological: Negative.  Negative for  dizziness, tingling, tremors, sensory change, speech change, focal weakness, weakness and headaches.  Endo/Heme/Allergies: Negative.  Negative for environmental allergies. Does not bruise/bleed easily.  Psychiatric/Behavioral: Negative.  Negative for depression and memory loss. The patient is not nervous/anxious and does not have insomnia.   All other systems reviewed and are negative.  Performance status (ECOG): 1  Vitals Blood pressure (!) 103/58, pulse 94, temperature (!) 97.1 F (36.2 C), temperature source Tympanic, resp. rate 18, weight 132 lb 11.5 oz (60.2 kg), SpO2 98 %.   Physical Exam Vitals and nursing note reviewed.  Constitutional:      General: She is not in acute distress.    Appearance: She is well-developed. She is not diaphoretic.     Comments: Thin woman sitting comfortably in the exam room in  no acute distress.  HENT:     Head: Normocephalic and atraumatic.     Mouth/Throat:     Mouth: Mucous membranes are moist.     Pharynx: Oropharynx is clear. No oropharyngeal exudate.  Eyes:     General: No scleral icterus.    Extraocular Movements: Extraocular movements intact.     Conjunctiva/sclera: Conjunctivae normal.     Pupils: Pupils are equal, round, and reactive to light.  Neck:     Vascular: No JVD.  Cardiovascular:     Rate and Rhythm: Normal rate and regular rhythm.     Heart sounds: Normal heart sounds. No murmur heard.  No friction rub. No gallop.   Pulmonary:     Effort: No respiratory distress.     Breath sounds: Wheezing present. No rales.  Chest:     Chest wall: No tenderness.  Abdominal:     General: Bowel sounds are normal. There is no distension.     Palpations: Abdomen is soft. There is no hepatomegaly, splenomegaly or mass.     Tenderness: There is no abdominal tenderness. There is no guarding or rebound.  Musculoskeletal:        General: No tenderness. Normal range of motion.     Cervical back: Normal range of motion and neck supple.  Lymphadenopathy:     Head:     Right side of head: No preauricular, posterior auricular or occipital adenopathy.     Left side of head: No preauricular, posterior auricular or occipital adenopathy.     Cervical: No cervical adenopathy.     Upper Body:     Right upper body: No supraclavicular or axillary adenopathy.     Left upper body: No supraclavicular or axillary adenopathy.     Lower Body: No right inguinal adenopathy. No left inguinal adenopathy.  Skin:    General: Skin is warm and dry.     Coloration: Skin is not pale.     Findings: No erythema or rash.     Comments: Port-a-cath site unremarkable.  Neurological:     Mental Status: She is alert and oriented to person, place, and time.  Psychiatric:        Behavior: Behavior normal.        Thought Content: Thought content normal.        Judgment: Judgment  normal.    Imaging studies: 05/30/2017:  Chest, abdomen, and pelvic CTrevealed continued improvement in the right upper lobe mass, right hilar and mediastinal lymphadenopathy consistent with response to therapy. there was interval development of multiple small ill-defined peribronchovascular nodules in both lungs, greatest in the right upper lobe. Some of these were clustered, and were not associated with the bronchovascular  bundles such that lymphangitic tumor was unlikely. These were likely inflammatory. There was no evidence of metastatic disease in the abdomen or pelvis 06/07/2018:  Chest, abdomen, and pelvic CTon 06/07/2018 revealed interval development of L4 vertebral body burst fracturewith mild retropulsion of the posterior aspect of the vertebral body. There was associated sclerosis of the L4 vertebral body with significant height loss predominately involving the anterior and mid aspect of the vertebral body. Possibility of pathologic fracture was not excluded. There was a nondisplacedright L4 transverse process fracture. Recommend further evaluation with lumbar spine MRI. There were findings suggestive of mild acute sigmoid colonic diverticulitis. There was similar to mild interval increase in size of previously described lesion within the right upper lobe (3.1 x 1.8 cm to 3.2 x 2.2 cm). Adjacent irregular nodulealong the inferior margin within the right upper lobe was grossly similar when compared to prior (1.9 x 1.1 cm to 1.2 x 2.0 cm). Additional areas of consolidation within the right lung were grossly similar. There was interval improvement in previously described tree-in-bud nodularity within the left lower lobe with slight interval increase in ground-glass nodularity within the right lower lobe suggestive of atypical infectious process (MAI). 08/12/2018:  PETscanrevealed a 3.1 x 3.2 focus area of opacity in the RIGHTupper lobe (SUV 2.0). Additionally, there was a 1.9 x 2.2 cm  perihilar RIGHTlung lesion extending into the superior aspect of the RIGHThilum (SUV 1.8). Both areas considered to be infectious/inflammatory, however low-grade neoplasm cannot be excluded. Short-term follow-up recommended. 11/12/2018:  ChestCTrevealed expected evolution of post radiation pneumonitis/fibrosis in the right suprahilar region.There was no evidence of local recurrence or metastatic disease.There was no adenopathy. There was aorticatherosclerosis. 05/05/2019:  Chest, abdomen, and pelvis CTrevealed evolving postradiation mass-like fibrosis in the upper right lung, similar to the prior study.There was slight enlargement of small right pleural effusion.There were no definitive findings to suggest locally recurrent disease or metastatic disease in the chest, abdomen or pelvis.There was a new compression fractureof superior endplate of E7MCNO 70% loss of anterior vertebral body height, favored to be a benign compression fracture. There was severe colonic diverticulosis without evidence of acute diverticulitis, aortic atherosclerosis, in addition to left main and 3 vessel coronary artery disease. Therewere calcifications of the aortic valve and mitral annulus.There was diffuse bronchial wall thickening with very mild centrilobular and mild paraseptal emphysema; imaging findings suggestive of underlying COPD. 11/03/2018:  Chest CT revealed stable appearance of RIGHT upper lobe post radiation fibrosis and bronchiectatic change with similar appearance of consolidative changes and pleural-parenchymal scarring emanating from the RIGHT hilum extending to the periphery. There was a small RIGHT-sided pleural effusion is slightly larger (12 mm greatest depth previously 6 mm).  There was sclerosis of inferior endplate of T6 with slight interval loss of height at this level as compared to the previous study overall approximately 10-20% loss of height. This may represent a subacute fracture. Correlate  with any acute pain in this location. There was a similar appearance of T5 compression fracture. She is s/p LEFT mastectomy.  She has three-vessel coronary artery disease. 11/10/2019:  Bone scan revealed increased activity noted over the midthoracic spine and lower lumbar spine corresponding to previously identified compression fractures. There was no other abnormality noted to suggest metastatic disease. Left ribs were unremarkable. 05/04/2020:  Chest, abdomen, and pelvis CT with contrast revealed stable appearance of the right suprahilar and mild associated perihilar consolidation with associated air bronchograms likely a manifestation of prior radiation therapy. There were no compelling findings of progressive  or active malignancy. Other imaging findings of potential clinical significance: Coronary atherosclerosis. There was a patent infrarenal aorta bi-iliac stent graft traversing a small thrombosed abdominal aortic aneurysm. There was mild left renal atrophy. There was descending and sigmoid colon diverticulosis. There was lumbar spondylosis and degenerative disc disease causing impingement at L3-4, L4-5, and L5-S1. There was an old sternal body fracture. There were stable thoracolumbar compression fractures, stable prior vertebral augmentations at T5 and L4. There was aortic atherosclerosis.   Infusion on 05/04/2020  Component Date Value Ref Range Status  . Sodium 05/04/2020 136  135 - 145 mmol/L Final  . Potassium 05/04/2020 4.1  3.5 - 5.1 mmol/L Final  . Chloride 05/04/2020 100  98 - 111 mmol/L Final  . CO2 05/04/2020 27  22 - 32 mmol/L Final  . Glucose, Bld 05/04/2020 92  70 - 99 mg/dL Final   Glucose reference range applies only to samples taken after fasting for at least 8 hours.  . BUN 05/04/2020 12  8 - 23 mg/dL Final  . Creatinine, Ser 05/04/2020 0.84  0.44 - 1.00 mg/dL Final  . Calcium 05/04/2020 8.8* 8.9 - 10.3 mg/dL Final  . Total Protein 05/04/2020 7.0  6.5 - 8.1 g/dL Final  .  Albumin 05/04/2020 3.4* 3.5 - 5.0 g/dL Final  . AST 05/04/2020 19  15 - 41 U/L Final  . ALT 05/04/2020 13  0 - 44 U/L Final  . Alkaline Phosphatase 05/04/2020 73  38 - 126 U/L Final  . Total Bilirubin 05/04/2020 0.4  0.3 - 1.2 mg/dL Final  . GFR, Estimated 05/04/2020 >60  >60 mL/min Final   Comment: (NOTE) Calculated using the CKD-EPI Creatinine Equation (2021)   . Anion gap 05/04/2020 9  5 - 15 Final   Performed at [facility], [address] [place], [place] [address]  . WBC 05/04/2020 7.7  4.0 - 10.5 K/uL Final  . RBC 05/04/2020 4.47  3.87 - 5.11 MIL/uL Final  . Hemoglobin 05/04/2020 13.0  12.0 - 15.0 g/dL Final  . HCT 05/04/2020 41.4  36 - 46 % Final  . MCV 05/04/2020 92.6  80.0 - 100.0 fL Final  . MCH 05/04/2020 29.1  26.0 - 34.0 pg Final  . MCHC 05/04/2020 31.4  30.0 - 36.0 g/dL Final  . RDW 05/04/2020 13.5  11.5 - 15.5 % Final  . Platelets 05/04/2020 264  150 - 400 K/uL Final  . nRBC 05/04/2020 0.0  0.0 - 0.2 % Final  . Neutrophils Relative % 05/04/2020 69  % Final  . Neutro Abs 05/04/2020 5.3  1.7 - 7.7 K/uL Final  . Lymphocytes Relative 05/04/2020 21  % Final  . Lymphs Abs 05/04/2020 1.6  0.7 - 4.0 K/uL Final  . Monocytes Relative 05/04/2020 7  % Final  . Monocytes Absolute 05/04/2020 0.6  0.1 - 1.0 K/uL Final  . Eosinophils Relative 05/04/2020 1  % Final  . Eosinophils Absolute 05/04/2020 0.1  0.0 - 0.5 K/uL Final  . Basophils Relative 05/04/2020 1  % Final  . Basophils Absolute 05/04/2020 0.0  0.0 - 0.1 K/uL Final  . Immature Granulocytes 05/04/2020 1  % Final  . Abs Immature Granulocytes 05/04/2020 0.04  0.00 - 0.07 K/uL Final   Performed at [facility], [address] [place], [place] [address]    Assessment:  Roberta [name] is a 74 y.o. female with limited stage small cell lung cancer. She had stage IIIB (T3N2M0) disease. She received cisplatin and etoposidefrom  03/05/2017 - 05/14/2017. She received radiationfrom 03/08/2017 -  04/23/2017.  She received PCIfrom 06/19/2017 - 07/11/2017.  Chest, abdomen, and pelvic CTon 06/07/2018 revealed interval development of L4 vertebral body burst fracturewith mild retropulsion of the posterior aspect of the vertebral body. There was associated sclerosis of the L4 vertebral body with significant height loss predominately involving the anterior and mid aspect of the vertebral body. Possibility of pathologic fracture was not excluded. There was a nondisplacedright L4 transverse process fracture. Recommend further evaluation with lumbar spine MRI. There were findings suggestive of mild acute sigmoid colonic diverticulitis. There was similar to mild interval increase in size of previously described lesion within the right upper lobe (3.1 x 1.8 cm to 3.2 x 2.2 cm). Adjacent irregular nodulealong the inferior margin within the right upper lobe was grossly similar when compared to prior (1.9 x 1.1 cm to 1.2 x 2.0 cm). Additional areas of consolidation within the right lung were grossly similar. There was interval improvement in previously described tree-in-bud nodularity within the left lower lobe with slight interval increase in ground-glass nodularity within the right lower lobe suggestive of atypical infectious process (MAI).  Chest, abdomen, and pelvis CT with contrast on 05/04/2020 revealed stable appearance of the right suprahilar and mild associated perihilar consolidation with associated air bronchograms likely a manifestation of prior radiation therapy. There were no compelling findings of progressive or active malignancy. Other imaging findings of potential clinical significance: coronary atherosclerosis. There was a patent infrarenal aorta bi-iliac stent graft traversing a small thrombosed abdominal aortic aneurysm. There was mild left renal atrophy. There was descending and sigmoid colon diverticulosis. There was lumbar spondylosis and degenerative disc disease causing impingement  at L3-4, L4-5, and L5-S1. There was an old sternal body fracture. There were stable thoracolumbar compression fractures, stable prior vertebral augmentations at T5 and L4. There was aortic atherosclerosis.  CEA has been followed: 2.3 on 07/10/2018, 2.2 on 11/02/2018, and 2.9 on 11/03/2019.  She underwent L4 kyphoplasty with radiofrequency ablation on 06/11/2018. Pathologyrevealed viable bone and trilineage metabolic marrow with reactive changes and focal changes of fracture callus. IHC stains for pancytokeratin were negative. There was no evidence of metastatic disease.  She underwent T6 kyphoplasty on 12/16/2019.  Bone density on 05/21/2019 revealed osteoporosis with a T-score of -3.0 in the left femoral hip and -2.4 in the left forearm radius.  Bone density on 05/21/2019 showed osteoporosis with a T-score of -3.0 in the total left femur.   She has a history of left breast cancerin 2002. She underwent left mastectomy followed by adjuvant chemotherapy. She was treated by Dr. Oliva Bustard. She did not receive adjuvant radiation.  CA27.29 was 16.0 on 07/29/2019.  Symptomatically, she has been "ok." She had a cough a couple weeks ago that resolved with Robitussin. She denies fevers, cough, chest pain, shortness of breath, abdominal symptoms, and back pain. She smokes half a pack of cigarettes per day.  Exam is stable.  Plan: 1.   Review labs from 05/04/2020. 2.   Limited stage SCLC  Clinically, she continues to do well.  Exam reveals no evidence of recurrent disease.  Chest, abdomen and pelvis CT on 05/04/2020 was personally reviewed.  Agree with radiology findings.  No evidence of progressive disease. Continue surveillance imaging every 6 months. 3.L4 burst fractures/pkyphoplasty  She denies any back pain. 4.T5 superior endplate compression fracture She underwent T6 kyphoplasty on 12/16/2019.  He denies any back pain. 5.   Weight loss  Weight is up 1 pound.  Continue to  monitor. 6. Osteoporosis  Patient interested in Fosamax.  Patient has only 3 lower teeth with another one embedded.  Patient to follow-up with Dr. Sedalia Muta. 7.   Port-a-cath maintenance Port flush every 6-12 weeks. 8.   Chest CT on 11/02/2020. 9.   Encourage smoking cessation.  She is smoking 1.2 packs/day. 10.   RTC in 6 months for MD assessment, labs (CBC with diff, CMP, CEA- draw before CT), and review of chest CT.   I discussed the assessment and treatment plan with the patient.  The patient was provided an opportunity to ask questions and all were answered.  The patient agreed with the plan and demonstrated an understanding of the instructions.  The patient was advised to call back if the symptoms worsen or if the condition fails to improve as anticipated.  I provided 17 minutes of face-to-face time during this this encounter and > 50% was spent counseling as documented under my assessment and plan.  An additional 5 minutes were spent reviewing her chart (Epic and Care Everywhere) including notes, labs, and imaging studies.    Lequita Asal, MD, PhD    05/05/2020, 11:15 AM  I, Mirian Mo Tufford, am acting as a Education administrator for Lequita Asal, MD.  I, [place] Mike Gip, MD, have reviewed the above documentation for accuracy and completeness, and I agree with the above.

## 2020-05-05 ENCOUNTER — Encounter: Payer: Self-pay | Admitting: Hematology and Oncology

## 2020-05-05 ENCOUNTER — Inpatient Hospital Stay (HOSPITAL_BASED_OUTPATIENT_CLINIC_OR_DEPARTMENT_OTHER): Payer: Medicare HMO | Admitting: Hematology and Oncology

## 2020-05-05 ENCOUNTER — Telehealth: Payer: Self-pay | Admitting: Hematology and Oncology

## 2020-05-05 VITALS — BP 103/58 | HR 94 | Temp 97.1°F | Resp 18 | Wt 132.7 lb

## 2020-05-05 DIAGNOSIS — Z08 Encounter for follow-up examination after completed treatment for malignant neoplasm: Secondary | ICD-10-CM | POA: Diagnosis not present

## 2020-05-05 DIAGNOSIS — M810 Age-related osteoporosis without current pathological fracture: Secondary | ICD-10-CM

## 2020-05-05 DIAGNOSIS — S32041S Stable burst fracture of fourth lumbar vertebra, sequela: Secondary | ICD-10-CM

## 2020-05-05 DIAGNOSIS — Z95828 Presence of other vascular implants and grafts: Secondary | ICD-10-CM

## 2020-05-05 DIAGNOSIS — Z7189 Other specified counseling: Secondary | ICD-10-CM

## 2020-05-05 DIAGNOSIS — K2100 Gastro-esophageal reflux disease with esophagitis, without bleeding: Secondary | ICD-10-CM

## 2020-05-05 DIAGNOSIS — S22050S Wedge compression fracture of T5-T6 vertebra, sequela: Secondary | ICD-10-CM | POA: Diagnosis not present

## 2020-05-05 DIAGNOSIS — C3411 Malignant neoplasm of upper lobe, right bronchus or lung: Secondary | ICD-10-CM | POA: Diagnosis not present

## 2020-05-05 MED ORDER — OMEPRAZOLE 20 MG PO CPDR: 20.0000 mg | DELAYED_RELEASE_CAPSULE | Freq: Every day | ORAL | 1 refills | Status: DC

## 2020-05-05 NOTE — Progress Notes (Unsigned)
Patient states she is having really bad acid flux at night when she lays down. Patient wants to know if she can get a medication to help with it.

## 2020-05-05 NOTE — Patient Instructions (Signed)
  Stop Zantac.

## 2020-05-12 ENCOUNTER — Encounter: Payer: Self-pay | Admitting: Hematology and Oncology

## 2020-06-01 ENCOUNTER — Other Ambulatory Visit: Payer: Self-pay | Admitting: Hematology and Oncology

## 2020-06-01 DIAGNOSIS — K2100 Gastro-esophageal reflux disease with esophagitis, without bleeding: Secondary | ICD-10-CM

## 2020-06-16 ENCOUNTER — Inpatient Hospital Stay: Payer: Medicare HMO | Attending: Hematology and Oncology

## 2020-07-01 ENCOUNTER — Inpatient Hospital Stay: Payer: Medicare HMO | Attending: Hematology and Oncology

## 2020-07-01 ENCOUNTER — Other Ambulatory Visit: Payer: Self-pay

## 2020-07-01 DIAGNOSIS — Z853 Personal history of malignant neoplasm of breast: Secondary | ICD-10-CM | POA: Insufficient documentation

## 2020-07-01 DIAGNOSIS — S22050D Wedge compression fracture of T5-T6 vertebra, subsequent encounter for fracture with routine healing: Secondary | ICD-10-CM | POA: Diagnosis not present

## 2020-07-01 DIAGNOSIS — Z9221 Personal history of antineoplastic chemotherapy: Secondary | ICD-10-CM | POA: Diagnosis not present

## 2020-07-01 DIAGNOSIS — Z9012 Acquired absence of left breast and nipple: Secondary | ICD-10-CM | POA: Diagnosis not present

## 2020-07-01 DIAGNOSIS — M810 Age-related osteoporosis without current pathological fracture: Secondary | ICD-10-CM | POA: Insufficient documentation

## 2020-07-01 DIAGNOSIS — Z95828 Presence of other vascular implants and grafts: Secondary | ICD-10-CM

## 2020-07-01 DIAGNOSIS — Z452 Encounter for adjustment and management of vascular access device: Secondary | ICD-10-CM | POA: Insufficient documentation

## 2020-07-01 DIAGNOSIS — Z800 Family history of malignant neoplasm of digestive organs: Secondary | ICD-10-CM | POA: Diagnosis not present

## 2020-07-01 DIAGNOSIS — R059 Cough, unspecified: Secondary | ICD-10-CM | POA: Insufficient documentation

## 2020-07-01 DIAGNOSIS — C3490 Malignant neoplasm of unspecified part of unspecified bronchus or lung: Secondary | ICD-10-CM | POA: Diagnosis present

## 2020-07-01 DIAGNOSIS — R634 Abnormal weight loss: Secondary | ICD-10-CM | POA: Diagnosis not present

## 2020-07-01 DIAGNOSIS — F17210 Nicotine dependence, cigarettes, uncomplicated: Secondary | ICD-10-CM | POA: Insufficient documentation

## 2020-07-01 MED ORDER — SODIUM CHLORIDE 0.9% FLUSH
10.0000 mL | Freq: Once | INTRAVENOUS | Status: AC
  Administered 2020-07-01: 10 mL via INTRAVENOUS
  Filled 2020-07-01: qty 10

## 2020-07-01 MED ORDER — HEPARIN SOD (PORK) LOCK FLUSH 100 UNIT/ML IV SOLN
500.0000 [IU] | Freq: Once | INTRAVENOUS | Status: AC
  Administered 2020-07-01: 500 [IU] via INTRAVENOUS
  Filled 2020-07-01: qty 5

## 2020-07-01 MED ORDER — HEPARIN SOD (PORK) LOCK FLUSH 100 UNIT/ML IV SOLN
INTRAVENOUS | Status: AC
  Filled 2020-07-01: qty 5

## 2020-07-23 ENCOUNTER — Encounter: Payer: Self-pay | Admitting: Radiation Oncology

## 2020-07-23 ENCOUNTER — Ambulatory Visit
Admission: RE | Admit: 2020-07-23 | Discharge: 2020-07-23 | Disposition: A | Discharge status: Home / Self Care | Payer: Medicare HMO | Source: Ambulatory Visit | Attending: Radiation Oncology | Admitting: Radiation Oncology

## 2020-07-23 DIAGNOSIS — F17210 Nicotine dependence, cigarettes, uncomplicated: Secondary | ICD-10-CM | POA: Diagnosis not present

## 2020-07-23 DIAGNOSIS — Z923 Personal history of irradiation: Secondary | ICD-10-CM | POA: Diagnosis not present

## 2020-07-23 DIAGNOSIS — C3401 Malignant neoplasm of right main bronchus: Secondary | ICD-10-CM | POA: Diagnosis present

## 2020-07-23 DIAGNOSIS — Z9221 Personal history of antineoplastic chemotherapy: Secondary | ICD-10-CM | POA: Diagnosis not present

## 2020-07-23 NOTE — Progress Notes (Signed)
Radiation Oncology Follow up Note  Name: Roberta [name]   Date:   07/23/2020 MRN:  [id number] DOB: 07/24/1945    This 75 y.o. female presents to the clinic today for 3 and half year follow-up status post concurrent chemoradiation therapy to her chest as well as PCI for limited stage small cell lung cancer.  REFERRING PROVIDER: Gayland Curry, MD  HPI: Patient is a 75 year old female now out 3-1/2 years having completed concurrent chemoradiation therapy to her chest for limited stage small cell lung cancer as well as PCI.  Seen today in routine follow-up she is doing well specifically denies cough hemoptysis chest tightness or any bone pain.Marland Kitchen  Her recent CT scan shows no evidence of disease she has stable appearance of the right suprahilar and perihilar region with consolidation associated with radiation changes.  She has had some kyphoplasty secondary to burst fracture at L4 as well as T6 kyphoplasty in July 2021.  She continues to smoke.  COMPLICATIONS OF TREATMENT: none  FOLLOW UP COMPLIANCE: keeps appointments   PHYSICAL EXAM:  BP (P) 111/66 (BP Location: Right Arm, Patient Position: Sitting)   Pulse (P) 99   Temp (!) (P) 97.1 F (36.2 C) (Tympanic)   Resp (P) 18   Wt (P) 136 lb 6.4 oz (61.9 kg)   BMI (P) 22.02 kg/m  Well-developed well-nourished patient in NAD. HEENT reveals PERLA, EOMI, discs not visualized.  Oral cavity is clear. No oral mucosal lesions are identified. Neck is clear without evidence of cervical or supraclavicular adenopathy. Lungs are clear to A&P. Cardiac examination is essentially unremarkable with regular rate and rhythm without murmur rub or thrill. Abdomen is benign with no organomegaly or masses noted. Motor sensory and DTR levels are equal and symmetric in the upper and lower extremities. Cranial nerves II through XII are grossly intact. Proprioception is intact. No peripheral adenopathy or edema is identified. No motor or sensory levels are noted. Crude  visual fields are within normal range.  RADIOLOGY RESULTS: CT scan reviewed compatible with above-stated findings  PLAN: Present time patient is doing well with no evidence of disease now at 3-1/2 years from limited stage small cell lung cancer.  I am turning follow-up care over to medical oncology.  I would be happy to reevaluate the patient anytime should further consultation be indicated.  Patient knows to call with any concerns.  I would like to take this opportunity to thank you for allowing me to participate in the care of your patient.Noreene Filbert, MD

## 2020-07-27 ENCOUNTER — Other Ambulatory Visit: Payer: Self-pay | Admitting: Hematology and Oncology

## 2020-07-27 DIAGNOSIS — K2100 Gastro-esophageal reflux disease with esophagitis, without bleeding: Secondary | ICD-10-CM

## 2020-08-04 ENCOUNTER — Other Ambulatory Visit: Payer: Self-pay

## 2020-08-04 ENCOUNTER — Inpatient Hospital Stay: Payer: Medicare HMO | Attending: Hematology and Oncology

## 2020-08-04 DIAGNOSIS — Z85118 Personal history of other malignant neoplasm of bronchus and lung: Secondary | ICD-10-CM | POA: Insufficient documentation

## 2020-08-04 DIAGNOSIS — Z452 Encounter for adjustment and management of vascular access device: Secondary | ICD-10-CM | POA: Diagnosis present

## 2020-08-04 DIAGNOSIS — Z95828 Presence of other vascular implants and grafts: Secondary | ICD-10-CM

## 2020-08-04 MED ORDER — SODIUM CHLORIDE 0.9% FLUSH
10.0000 mL | Freq: Once | INTRAVENOUS | Status: AC
  Administered 2020-08-04: 10 mL via INTRAVENOUS
  Filled 2020-08-04: qty 10

## 2020-08-04 MED ORDER — HEPARIN SOD (PORK) LOCK FLUSH 100 UNIT/ML IV SOLN
500.0000 [IU] | Freq: Once | INTRAVENOUS | Status: AC
  Administered 2020-08-04: 500 [IU] via INTRAVENOUS
  Filled 2020-08-04: qty 5

## 2020-09-06 ENCOUNTER — Emergency Department
Admission: EM | Admit: 2020-09-06 | Discharge: 2020-09-07 | Disposition: A | Discharge status: Home / Self Care | Payer: Medicare HMO | Source: Home / Self Care | Attending: Emergency Medicine | Admitting: Emergency Medicine

## 2020-09-06 ENCOUNTER — Emergency Department: Payer: Medicare HMO

## 2020-09-06 DIAGNOSIS — E860 Dehydration: Secondary | ICD-10-CM | POA: Insufficient documentation

## 2020-09-06 DIAGNOSIS — F17210 Nicotine dependence, cigarettes, uncomplicated: Secondary | ICD-10-CM | POA: Insufficient documentation

## 2020-09-06 DIAGNOSIS — R410 Disorientation, unspecified: Secondary | ICD-10-CM

## 2020-09-06 DIAGNOSIS — J449 Chronic obstructive pulmonary disease, unspecified: Secondary | ICD-10-CM | POA: Insufficient documentation

## 2020-09-06 DIAGNOSIS — Z7951 Long term (current) use of inhaled steroids: Secondary | ICD-10-CM | POA: Insufficient documentation

## 2020-09-06 DIAGNOSIS — Z79899 Other long term (current) drug therapy: Secondary | ICD-10-CM | POA: Diagnosis not present

## 2020-09-06 DIAGNOSIS — G9341 Metabolic encephalopathy: Secondary | ICD-10-CM | POA: Diagnosis not present

## 2020-09-06 DIAGNOSIS — R778 Other specified abnormalities of plasma proteins: Secondary | ICD-10-CM | POA: Diagnosis not present

## 2020-09-06 DIAGNOSIS — Z20822 Contact with and (suspected) exposure to covid-19: Secondary | ICD-10-CM | POA: Diagnosis not present

## 2020-09-06 DIAGNOSIS — Z853 Personal history of malignant neoplasm of breast: Secondary | ICD-10-CM | POA: Insufficient documentation

## 2020-09-06 DIAGNOSIS — N179 Acute kidney failure, unspecified: Secondary | ICD-10-CM | POA: Insufficient documentation

## 2020-09-06 DIAGNOSIS — Z9221 Personal history of antineoplastic chemotherapy: Secondary | ICD-10-CM | POA: Insufficient documentation

## 2020-09-06 DIAGNOSIS — Z85118 Personal history of other malignant neoplasm of bronchus and lung: Secondary | ICD-10-CM | POA: Insufficient documentation

## 2020-09-06 LAB — COMPREHENSIVE METABOLIC PANEL
ALT: 14 U/L (ref 0–44)
AST: 23 U/L (ref 15–41)
Albumin: 4.1 g/dL (ref 3.5–5.0)
Alkaline Phosphatase: 93 U/L (ref 38–126)
Anion gap: 12 (ref 5–15)
BUN: 28 mg/dL — ABNORMAL HIGH (ref 8–23)
CO2: 26 mmol/L (ref 22–32)
Calcium: 9.5 mg/dL (ref 8.9–10.3)
Chloride: 98 mmol/L (ref 98–111)
Creatinine, Ser: 1.57 mg/dL — ABNORMAL HIGH (ref 0.44–1.00)
GFR, Estimated: 34 mL/min — ABNORMAL LOW (ref >60)
Glucose, Bld: 135 mg/dL — ABNORMAL HIGH (ref 70–99)
Potassium: 3.5 mmol/L (ref 3.5–5.1)
Sodium: 136 mmol/L (ref 135–145)
Total Bilirubin: 0.5 mg/dL (ref 0.3–1.2)
Total Protein: 7.9 g/dL (ref 6.5–8.1)

## 2020-09-06 LAB — TROPONIN I (HIGH SENSITIVITY): Troponin I (High Sensitivity): 19 ng/L — ABNORMAL HIGH (ref <18)

## 2020-09-06 LAB — URINALYSIS, COMPLETE (UACMP) WITH MICROSCOPIC
Bilirubin Urine: NEGATIVE
Glucose, UA: NEGATIVE mg/dL
Ketones, ur: NEGATIVE mg/dL
Leukocytes,Ua: NEGATIVE
Nitrite: NEGATIVE
Protein, ur: NEGATIVE mg/dL
Specific Gravity, Urine: 1.011 (ref 1.005–1.030)
pH: 7 (ref 5.0–8.0)

## 2020-09-06 LAB — CBC WITH DIFFERENTIAL/PLATELET
Abs Immature Granulocytes: 0.02 10*3/uL (ref 0.00–0.07)
Basophils Absolute: 0 10*3/uL (ref 0.0–0.1)
Basophils Relative: 0 %
Eosinophils Absolute: 0 10*3/uL (ref 0.0–0.5)
Eosinophils Relative: 1 %
HCT: 43.1 % (ref 36.0–46.0)
Hemoglobin: 14.1 g/dL (ref 12.0–15.0)
Immature Granulocytes: 0 %
Lymphocytes Relative: 23 %
Lymphs Abs: 1.5 10*3/uL (ref 0.7–4.0)
MCH: 29.6 pg (ref 26.0–34.0)
MCHC: 32.7 g/dL (ref 30.0–36.0)
MCV: 90.4 fL (ref 80.0–100.0)
Monocytes Absolute: 0.6 10*3/uL (ref 0.1–1.0)
Monocytes Relative: 10 %
Neutro Abs: 4.2 10*3/uL (ref 1.7–7.7)
Neutrophils Relative %: 66 %
Platelets: 253 10*3/uL (ref 150–400)
RBC: 4.77 MIL/uL (ref 3.87–5.11)
RDW: 14.1 % (ref 11.5–15.5)
WBC: 6.4 10*3/uL (ref 4.0–10.5)
nRBC: 0 % (ref 0.0–0.2)

## 2020-09-06 MED ORDER — LACTATED RINGERS IV BOLUS
1000.0000 mL | Freq: Once | INTRAVENOUS | Status: AC
  Administered 2020-09-06: 1000 mL via INTRAVENOUS

## 2020-09-06 NOTE — ED Notes (Signed)
Pt up to bathroom with assistance   Family with pt.

## 2020-09-06 NOTE — ED Triage Notes (Signed)
Per ems, daughter called due to patient being more confused, pt neg fast upon arrival, equal grips, no droop, pt states no complaints at this time

## 2020-09-06 NOTE — ED Provider Notes (Signed)
[facility] Emergency Department Provider Note ____________________________________________   Event Date/Time   First MD Initiated Contact with Patient 09/06/20 1956     (approximate)  I have reviewed the triage vital signs and the nursing notes.  HISTORY  Chief Complaint Altered Mental Status (Per ems, daughter called due to patient being more confused, pt neg fast upon arrival, equal grips, no droop, pt states no complaints at this time)   HPI Roberta [name] is a 75 y.o. femalewho presents to the ED for evaluation of altered mentation  Chart review indicates history of HLD, small cell lung cancer with Port-A-Cath in place.  COPD and tobacco abuse.  Patient presents to the ED with her daughter, provides additional history, from home for evaluation of transient confusion.  Daughter reports this afternoon at about 3 PM patient seemed more confused than typical.  She reports this lasted perhaps an hour or 2 before self resolving.  And has not recurred.  Daughter reports that patient seems normal now and is most concerned about the possibility of lung cancer metastasizing to her brain causing these symptoms.  Patient and daughter deny any recent illnesses for the patient.  Denied emesis, diarrhea, fever, any pain.  Including headache, chest pain or abdominal pain.  Patient reports she feels fine now and is requesting discharge.  I discussed the possibility of TIA with the daughter and the possibility of medical observation admission for this.  Daughter reports minimal concern for this and reports that she would keep a close eye on her mother at home.  As indicated separately, she is most concerned with the possibility of lung cancer metastases to the brain causing his confusion. Patient and daughter report that patient primarily drinks coffee at home and is difficult to convince her to drink water to stay hydrated.  Past Medical History:  Diagnosis Date  . AAA  (abdominal aortic aneurysm) ([place])   . Anemia   . Breast cancer ([place]) 2002   left  . COPD (chronic obstructive pulmonary disease) ([place])   . Dyspnea   . Headache   . High cholesterol   . Personal history of chemotherapy   . Small cell lung cancer ([place])   . Tobacco abuse     Patient Active Problem List   Diagnosis Date Noted  . Malignant neoplasm of unspecified part of unspecified bronchus or lung ([place]) 02/22/2020  . Cough 02/12/2020  . Rib pain on left side 11/29/2019  . Compression fracture of T6 vertebra ([place]) 11/05/2019  . Weight loss 07/29/2019  . Age-related osteoporosis without current pathological fracture 05/21/2019  . Compression fracture of T5 vertebra ([place]) 05/06/2019  . Port-A-Cath in place 05/06/2019  . Closed stable burst fracture of fourth lumbar vertebra ([place]) 06/08/2018  . Severe protein-calorie malnutrition ([place]) 10/29/2017  . Sepsis ([place]) 04/28/2017  . Small cell lung cancer, right upper lobe ([place]) 02/23/2017  . Goals of care, counseling/discussion 02/23/2017  . Lung mass   . AAA (abdominal aortic aneurysm) without rupture ([place]) 11/02/2016  . COPD mixed type ([place]) 11/02/2016  . Hyperlipidemia 11/02/2016  . PAD (peripheral artery disease) ([place]) 11/02/2016    Past Surgical History:  Procedure Laterality Date  . ABDOMINAL HYSTERECTOMY    . ARTERY BIOPSY Right 12/05/2017   Procedure: BIOPSY TEMPORAL ARTERY;  Surgeon: Margaretha Sheffield, MD;  Location: [facility] ORS;  Service: ENT;  Laterality: Right;  . ENDOBRONCHIAL ULTRASOUND N/A 02/19/2017   Procedure: ENDOBRONCHIAL ULTRASOUND;  Surgeon: Flora Lipps, MD;  Location: [facility] ORS;  Service: Cardiopulmonary;  Laterality: N/A;  . ENDOVASCULAR REPAIR/STENT GRAFT N/A 12/06/2016   Procedure: Endovascular Repair/Stent Graft;  Surgeon: Katha Cabal, MD;  Location: [facility];  Service: Cardiovascular;  Laterality: N/A;  . IR FLUORO GUIDE PORT INSERTION RIGHT  03/02/2017  . KYPHOPLASTY N/A 06/11/2018   Procedure:  KYPHOPLASTY L4 BIOPSY WITH RFA;  Surgeon: Hessie Knows, MD;  Location: [facility] ORS;  Service: Orthopedics;  Laterality: N/A;  . KYPHOPLASTY N/A 12/16/2019   Procedure: T6 KYPHOPLASTY;  Surgeon: Hessie Knows, MD;  Location: [facility] ORS;  Service: Orthopedics;  Laterality: N/A;  . MASTECTOMY Left 2002   with sentinel node     Prior to Admission medications   Medication Sig Start Date End Date Taking? Authorizing Provider  acetaminophen (TYLENOL) 325 MG tablet Take 650 mg by mouth every 6 (six) hours as needed for mild pain.     [provider]  albuterol (PROVENTIL HFA;VENTOLIN HFA) 108 (90 Base) MCG/ACT inhaler Inhale 2 puffs into the lungs every 6 (six) hours as needed for wheezing or shortness of breath. 03/23/16   Frederich Cha, MD  atorvastatin (LIPITOR) 10 MG tablet Take 10 mg by mouth at bedtime.  08/11/16   [provider]  fluticasone-salmeterol (ADVAIR HFA) 115-21 MCG/ACT inhaler Inhale 2 puffs into the lungs 2 (two) times daily.    [provider]  HYDROcodone-acetaminophen (NORCO) 5-325 MG tablet Take 1 tablet by mouth every 6 (six) hours as needed for moderate pain. Patient not taking: No sig reported 12/16/19   Hessie Knows, MD  lidocaine-prilocaine (EMLA) cream Apply 1 application topically as needed. Patient taking differently: Apply 1 application topically as needed (port access). 11/05/19   Lequita Asal, MD  Multiple Vitamin (MULTIVITAMIN) tablet Take 1 tablet by mouth daily.    [provider]  omeprazole (PRILOSEC) 20 MG capsule TAKE ONE CAPSULE BY MOUTH ONCE DAILY 07/27/20   Lequita Asal, MD    Allergies Patient has no known allergies.  Family History  Problem Relation Age of Onset  . CAD Mother   . Colon cancer Brother   . Breast cancer Neg Hx     Social History Social History   Tobacco Use  . Smoking status: Current Every Day Smoker    Packs/day: 1.50    Years: 50.00    Pack years: 75.00    Types: Cigarettes  .  Smokeless tobacco: Never Used  Vaping Use  . Vaping Use: Never used  Substance Use Topics  . Alcohol use: No  . Drug use: No    Review of Systems  Constitutional: No fever/chills.  Positive for confusion and generalized weakness Eyes: No visual changes. ENT: No sore throat. Cardiovascular: Denies chest pain. Respiratory: Denies shortness of breath. Gastrointestinal: No abdominal pain.  No nausea, no vomiting.  No diarrhea.  No constipation. Genitourinary: Negative for dysuria. Musculoskeletal: Negative for back pain. Skin: Negative for rash. Neurological: Negative for headaches, focal weakness or numbness.  ____________________________________________   PHYSICAL EXAM:  VITAL SIGNS: Vitals:   09/06/20 2310 09/06/20 2315  BP: (!) 169/93   Pulse: 99 98  Resp:    Temp:    SpO2: 97% 99%    Constitutional: Alert and oriented. Well appearing and in no acute distress. Eyes: Conjunctivae are normal. PERRL. EOMI. Head: Atraumatic. Nose: No congestion/rhinnorhea. Mouth/Throat: Mucous membranes are dry.  Oropharynx non-erythematous.  Poor dentition without evidence of periapical abscess Neck: No stridor. No cervical spine tenderness to palpation. Cardiovascular: Normal rate, regular rhythm. Grossly normal  heart sounds.  Good peripheral circulation. Respiratory: Normal respiratory effort.  No retractions. Lungs CTAB. Gastrointestinal: Soft , nondistended, nontender to palpation. No CVA tenderness.  Benign exam. Musculoskeletal: No lower extremity tenderness nor edema.  No joint effusions. No signs of acute trauma. Neurologic:  Normal speech and language. No gross focal neurologic deficits are appreciated.  Skin:  Skin is warm, dry and intact. No rash noted. Psychiatric: Mood and affect are normal. Speech and behavior are normal.  ____________________________________________   LABS (all labs ordered are listed, but only abnormal results are displayed)  Labs Reviewed   COMPREHENSIVE METABOLIC PANEL - Abnormal; Notable for the following components:      Result Value   Glucose, Bld 135 (*)    BUN 28 (*)    Creatinine, Ser 1.57 (*)    GFR, Estimated 34 (*)    All other components within normal limits  URINALYSIS, COMPLETE (UACMP) WITH MICROSCOPIC - Abnormal; Notable for the following components:   Color, Urine STRAW (*)    APPearance CLEAR (*)    Hgb urine dipstick SMALL (*)    Bacteria, UA RARE (*)    All other components within normal limits  TROPONIN I (HIGH SENSITIVITY) - Abnormal; Notable for the following components:   Troponin I (High Sensitivity) 19 (*)    All other components within normal limits  CBC WITH DIFFERENTIAL/PLATELET  TROPONIN I (HIGH SENSITIVITY)   ____________________________________________  12 Lead EKG  Sinus rhythm, rate of 102 bpm.  Normal axis and intervals.  Stigmata of LVH.  Sinus tachycardia.  No evidence of acute ischemia. ____________________________________________  RADIOLOGY  ED MD interpretation: CT head reviewed by me without evidence of acute intracranial pathology. CXR reviewed by me without evidence of acute cardiopulmonary pathology.  Official radiology report(s): CT Head Wo Contrast  Result Date: 09/06/2020 CLINICAL DATA:  Mental status change, unknown cause altered and confused. nonfocal. hx lung cancer. eval signs of cva, mets, ich EXAM: CT HEAD WITHOUT CONTRAST TECHNIQUE: Contiguous axial images were obtained from the base of the skull through the vertex without intravenous contrast. COMPARISON:  Head CT 11/19/2017.  Brain MRI 02/12/2018 FINDINGS: Brain: Mild motion artifact limitations. Stable degree of atrophy and chronic small vessel ischemia. No evidence of mass or mass effect on this noncontrast motion limited exam. No hemorrhage or subdural collection. No evidence of acute ischemia. No hydrocephalus. Vascular: Atherosclerosis of skullbase vasculature without hyperdense vessel or abnormal  calcification. Skull: No fracture or focal lesion. Sinuses/Orbits: Mild mucosal thickening and debris involving posterior right ethmoid air cell. Paranasal sinuses otherwise clear. Hypoplastic left mastoid air cells. No mastoid effusion. No acute orbital findings. Other: None. IMPRESSION: 1. No acute intracranial abnormality. Mild motion artifact limitations. 2. Stable atrophy and chronic small vessel ischemia. Electronically Signed   By: Keith Rake M.D.   On: 09/06/2020 20:57   DG Chest Portable 1 View  Result Date: 09/06/2020 CLINICAL DATA:  Altered mental status. EXAM: PORTABLE CHEST 1 VIEW COMPARISON:  February 12, 2020 FINDINGS: There is stable left-sided venous Port-A-Cath positioning. The lungs are hyperinflated. Stable right upper lobe and right suprahilar scarring and postoperative changes are seen. Stable right apical pleural thickening is also noted. There is no evidence of a pleural effusion or pneumothorax. The heart size and mediastinal contours are within normal limits. Radiopaque surgical clips are seen overlying the lateral aspect of the mid left chest wall. Prior vertebroplasty is noted at the level of T6 with degenerative changes seen throughout the remainder of the thoracic spine.  IMPRESSION: 1. Stable right upper lobe and right suprahilar scarring and postoperative changes without acute cardiopulmonary disease. Electronically Signed   By: Virgina Norfolk M.D.   On: 09/06/2020 20:25   ____________________________________________   PROCEDURES and INTERVENTIONS  Procedure(s) performed (including Critical Care):  .1-3 Lead EKG Interpretation Performed by: Vladimir Crofts, MD Authorized by: Vladimir Crofts, MD     Interpretation: abnormal     ECG rate:  102   ECG rate assessment: tachycardic     Rhythm: sinus tachycardia     Ectopy: none     Conduction: normal      Medications  lactated ringers bolus 1,000 mL (0 mLs Intravenous Stopped 09/06/20 2314)    ____________________________________________   MDM / ED COURSE   75 year old woman presents from home with her daughter for evaluation of transient episode of nonfocal confusion, since resolved, likely due to dehydration and AKI, and ultimately amenable to outpatient management.  Presents with mild sinus tachycardia, otherwise hemodynamically stable on room air.  Exam with stigmata of dehydration with dry mucous membranes and her mild tachycardia.  She otherwise looks well without evidence of neurologic or vascular deficits.  No distress or signs of trauma.  Blood work with AKI, otherwise unremarkable.  CT head demonstrates no evidence of ICH, CVA or brain metastases, as daughter is concerned about.  CXR demonstrates no infiltrates or acute pathology.  She tolerates p.o. intake and has no concerning episodes here in the ED.  I discussed the possibility of medical observation admission for stroke work-up in the setting of possible TIA, but patient and daughter declined indicated prefer to go home.  We discussed following up with a PCP and return precautions for the ED.  Patient stable for outpatient management.  Clinical Course as of 09/07/20 0010  Mon Sep 06, 2020  2139 Shared decision making with patient and daughter, remains at the bedside, about the possibility of TIA contributing to her symptoms.  We discussed the possibility of medical observation admission.  Daughter reports relief that CT head demonstrates no metastases from lung cancer.  She reports little concern for TIA and expresses desire to take the patient home tonight.  Patient is agreeable.  We discussed IV fluids for AKI, urinalysis for possible UTI contributing to metabolic cephalopathy. [DS]  Tue Sep 07, 2020  0008 Reassessed.  Patient again is requesting discharge.  Daughter reports reassurance with work-up and observation period.  We thoroughly discussed return precautions for the ED and answered questions [DS]    Clinical  Course User Index [DS] Vladimir Crofts, MD    ____________________________________________   FINAL CLINICAL IMPRESSION(S) / ED DIAGNOSES  Final diagnoses:  AKI (acute kidney injury) ([place])  Dehydration  Confusion     ED Discharge Orders    None       Terriyah Westra   Note:  This document was prepared using Dragon voice recognition software and may include unintentional dictation errors.   Vladimir Crofts, MD 09/07/20 [phone]

## 2020-09-06 NOTE — ED Notes (Signed)
Resumed care from [facility] rn  Pt alert.  Pt denies pain

## 2020-09-07 ENCOUNTER — Observation Stay: Payer: Medicare HMO

## 2020-09-07 ENCOUNTER — Observation Stay
Admission: EM | Admit: 2020-09-07 | Discharge: 2020-09-09 | Disposition: A | Discharge status: Home / Self Care | Payer: Medicare HMO | Attending: Emergency Medicine | Admitting: Emergency Medicine

## 2020-09-07 ENCOUNTER — Encounter: Payer: Self-pay | Admitting: Emergency Medicine

## 2020-09-07 ENCOUNTER — Other Ambulatory Visit: Payer: Self-pay

## 2020-09-07 DIAGNOSIS — J449 Chronic obstructive pulmonary disease, unspecified: Secondary | ICD-10-CM | POA: Diagnosis not present

## 2020-09-07 DIAGNOSIS — R4182 Altered mental status, unspecified: Secondary | ICD-10-CM

## 2020-09-07 DIAGNOSIS — C3411 Malignant neoplasm of upper lobe, right bronchus or lung: Secondary | ICD-10-CM

## 2020-09-07 DIAGNOSIS — G9341 Metabolic encephalopathy: Secondary | ICD-10-CM | POA: Diagnosis not present

## 2020-09-07 DIAGNOSIS — N179 Acute kidney failure, unspecified: Secondary | ICD-10-CM

## 2020-09-07 DIAGNOSIS — Z720 Tobacco use: Secondary | ICD-10-CM | POA: Diagnosis present

## 2020-09-07 DIAGNOSIS — F17210 Nicotine dependence, cigarettes, uncomplicated: Secondary | ICD-10-CM | POA: Insufficient documentation

## 2020-09-07 DIAGNOSIS — E860 Dehydration: Secondary | ICD-10-CM

## 2020-09-07 DIAGNOSIS — Z853 Personal history of malignant neoplasm of breast: Secondary | ICD-10-CM | POA: Insufficient documentation

## 2020-09-07 DIAGNOSIS — Z20822 Contact with and (suspected) exposure to covid-19: Secondary | ICD-10-CM | POA: Insufficient documentation

## 2020-09-07 DIAGNOSIS — K219 Gastro-esophageal reflux disease without esophagitis: Secondary | ICD-10-CM

## 2020-09-07 DIAGNOSIS — Z79899 Other long term (current) drug therapy: Secondary | ICD-10-CM | POA: Insufficient documentation

## 2020-09-07 DIAGNOSIS — Z85118 Personal history of other malignant neoplasm of bronchus and lung: Secondary | ICD-10-CM | POA: Insufficient documentation

## 2020-09-07 DIAGNOSIS — R778 Other specified abnormalities of plasma proteins: Secondary | ICD-10-CM | POA: Insufficient documentation

## 2020-09-07 DIAGNOSIS — E785 Hyperlipidemia, unspecified: Secondary | ICD-10-CM | POA: Diagnosis present

## 2020-09-07 DIAGNOSIS — R7989 Other specified abnormal findings of blood chemistry: Secondary | ICD-10-CM | POA: Diagnosis present

## 2020-09-07 LAB — CBC WITH DIFFERENTIAL/PLATELET
Abs Immature Granulocytes: 0.02 10*3/uL (ref 0.00–0.07)
Basophils Absolute: 0 10*3/uL (ref 0.0–0.1)
Basophils Relative: 0 %
Eosinophils Absolute: 0 10*3/uL (ref 0.0–0.5)
Eosinophils Relative: 0 %
HCT: 38.1 % (ref 36.0–46.0)
Hemoglobin: 12.6 g/dL (ref 12.0–15.0)
Immature Granulocytes: 0 %
Lymphocytes Relative: 14 %
Lymphs Abs: 1.2 10*3/uL (ref 0.7–4.0)
MCH: 29.2 pg (ref 26.0–34.0)
MCHC: 33.1 g/dL (ref 30.0–36.0)
MCV: 88.4 fL (ref 80.0–100.0)
Monocytes Absolute: 0.7 10*3/uL (ref 0.1–1.0)
Monocytes Relative: 8 %
Neutro Abs: 6.2 10*3/uL (ref 1.7–7.7)
Neutrophils Relative %: 78 %
Platelets: 249 10*3/uL (ref 150–400)
RBC: 4.31 MIL/uL (ref 3.87–5.11)
RDW: 14 % (ref 11.5–15.5)
WBC: 8.1 10*3/uL (ref 4.0–10.5)
nRBC: 0 % (ref 0.0–0.2)

## 2020-09-07 LAB — D-DIMER, QUANTITATIVE: D-Dimer, Quant: 2.32 ug/mL-FEU — ABNORMAL HIGH (ref 0.00–0.50)

## 2020-09-07 LAB — BASIC METABOLIC PANEL
Anion gap: 11 (ref 5–15)
BUN: 24 mg/dL — ABNORMAL HIGH (ref 8–23)
CO2: 26 mmol/L (ref 22–32)
Calcium: 8.8 mg/dL — ABNORMAL LOW (ref 8.9–10.3)
Chloride: 97 mmol/L — ABNORMAL LOW (ref 98–111)
Creatinine, Ser: 1.37 mg/dL — ABNORMAL HIGH (ref 0.44–1.00)
GFR, Estimated: 41 mL/min — ABNORMAL LOW (ref >60)
Glucose, Bld: 111 mg/dL — ABNORMAL HIGH (ref 70–99)
Potassium: 3.5 mmol/L (ref 3.5–5.1)
Sodium: 134 mmol/L — ABNORMAL LOW (ref 135–145)

## 2020-09-07 LAB — TROPONIN I (HIGH SENSITIVITY)
Troponin I (High Sensitivity): 50 ng/L — ABNORMAL HIGH (ref <18)
Troponin I (High Sensitivity): 52 ng/L — ABNORMAL HIGH (ref <18)
Troponin I (High Sensitivity): 54 ng/L — ABNORMAL HIGH (ref <18)

## 2020-09-07 LAB — RESP PANEL BY RT-PCR (FLU A&B, COVID) ARPGX2
Influenza A by PCR: NEGATIVE
Influenza B by PCR: NEGATIVE
SARS Coronavirus 2 by RT PCR: NEGATIVE

## 2020-09-07 MED ORDER — ALBUTEROL SULFATE (2.5 MG/3ML) 0.083% IN NEBU: 2.5000 mg | INHALATION_SOLUTION | RESPIRATORY_TRACT | Status: DC | PRN

## 2020-09-07 MED ORDER — SODIUM CHLORIDE 0.9 % IV BOLUS
500.0000 mL | Freq: Once | INTRAVENOUS | Status: AC
  Administered 2020-09-07: 500 mL via INTRAVENOUS

## 2020-09-07 MED ORDER — SODIUM CHLORIDE 0.9 % IV SOLN: INTRAVENOUS | Status: DC

## 2020-09-07 MED ORDER — IPRATROPIUM-ALBUTEROL 0.5-2.5 (3) MG/3ML IN SOLN
3.0000 mL | Freq: Three times a day (TID) | RESPIRATORY_TRACT | Status: DC
  Administered 2020-09-08 – 2020-09-09 (×5): 3 mL via RESPIRATORY_TRACT
  Filled 2020-09-07 (×5): qty 3

## 2020-09-07 MED ORDER — ATORVASTATIN CALCIUM 10 MG PO TABS
10.0000 mg | ORAL_TABLET | Freq: Every day | ORAL | Status: DC
  Administered 2020-09-07 – 2020-09-08 (×2): 10 mg via ORAL
  Filled 2020-09-07 (×2): qty 1

## 2020-09-07 MED ORDER — ACETAMINOPHEN 325 MG PO TABS: 650.0000 mg | ORAL_TABLET | Freq: Four times a day (QID) | ORAL | Status: DC | PRN

## 2020-09-07 MED ORDER — IOHEXOL 350 MG/ML SOLN
60.0000 mL | Freq: Once | INTRAVENOUS | Status: AC | PRN
  Administered 2020-09-07: 60 mL via INTRAVENOUS

## 2020-09-07 MED ORDER — DM-GUAIFENESIN ER 30-600 MG PO TB12: 1.0000 | ORAL_TABLET | Freq: Two times a day (BID) | ORAL | Status: DC | PRN

## 2020-09-07 MED ORDER — ENOXAPARIN SODIUM 40 MG/0.4ML ~~LOC~~ SOLN
40.0000 mg | SUBCUTANEOUS | Status: DC
  Administered 2020-09-07 – 2020-09-08 (×2): 40 mg via SUBCUTANEOUS
  Filled 2020-09-07 (×2): qty 0.4

## 2020-09-07 MED ORDER — ONDANSETRON HCL 4 MG/2ML IJ SOLN: 4.0000 mg | Freq: Three times a day (TID) | INTRAMUSCULAR | Status: DC | PRN

## 2020-09-07 MED ORDER — ASPIRIN EC 81 MG PO TBEC
81.0000 mg | DELAYED_RELEASE_TABLET | Freq: Every day | ORAL | Status: DC
  Administered 2020-09-08 – 2020-09-09 (×2): 81 mg via ORAL
  Filled 2020-09-07 (×2): qty 1

## 2020-09-07 MED ORDER — MOMETASONE FURO-FORMOTEROL FUM 200-5 MCG/ACT IN AERO
2.0000 | INHALATION_SPRAY | Freq: Two times a day (BID) | RESPIRATORY_TRACT | Status: DC
  Administered 2020-09-07 – 2020-09-09 (×4): 2 via RESPIRATORY_TRACT
  Filled 2020-09-07: qty 8.8

## 2020-09-07 MED ORDER — NICOTINE 21 MG/24HR TD PT24
21.0000 mg | MEDICATED_PATCH | Freq: Every day | TRANSDERMAL | Status: DC
  Administered 2020-09-07 – 2020-09-09 (×3): 21 mg via TRANSDERMAL
  Filled 2020-09-07 (×3): qty 1

## 2020-09-07 MED ORDER — PANTOPRAZOLE SODIUM 40 MG PO TBEC
40.0000 mg | DELAYED_RELEASE_TABLET | Freq: Every day | ORAL | Status: DC
  Administered 2020-09-07 – 2020-09-09 (×3): 40 mg via ORAL
  Filled 2020-09-07 (×3): qty 1

## 2020-09-07 MED ORDER — ACETAMINOPHEN 650 MG RE SUPP: 650.0000 mg | Freq: Four times a day (QID) | RECTAL | Status: DC | PRN

## 2020-09-07 MED ORDER — IPRATROPIUM-ALBUTEROL 0.5-2.5 (3) MG/3ML IN SOLN
3.0000 mL | Freq: Four times a day (QID) | RESPIRATORY_TRACT | Status: DC
  Administered 2020-09-07 (×2): 3 mL via RESPIRATORY_TRACT
  Filled 2020-09-07: qty 3

## 2020-09-07 NOTE — ED Provider Notes (Signed)
[facility] Emergency Department Provider Note  ____________________________________________  Time seen: Approximately 12:27 PM  I have reviewed the triage vital signs and the nursing notes.   HISTORY  Chief Complaint Altered Mental Status    Level 5 Caveat: Portions of the History and Physical including HPI and review of systems are unable to be completely obtained due to altered mental status  HPI CHANTELE [name] is a 75 y.o. female with a history of AAA, COPD, small cell lung cancer who is brought to the ED due to confusion and slurred speech.  This started yesterday, was waxing and waning throughout the day.  She was evaluated in the ED and had labs and a CT scan of the head which were reassuring.  She seemed to be better later on and so was taken home.  However, on waking up this morning, daughter notices that the patient is much more confused again.  Patient denies any focal pain, does not relate any specific symptoms.      Past Medical History:  Diagnosis Date  . AAA (abdominal aortic aneurysm) ([place])   . Anemia   . Breast cancer ([place]) 2002   left  . COPD (chronic obstructive pulmonary disease) ([place])   . Dyspnea   . Headache   . High cholesterol   . Personal history of chemotherapy   . Small cell lung cancer ([place])   . Tobacco abuse      Patient Active Problem List   Diagnosis Date Noted  . Acute metabolic encephalopathy 93/71/6967  . Elevated troponin 09/07/2020  . AKI (acute kidney injury) ([place]) 09/07/2020  . GERD (gastroesophageal reflux disease) 09/07/2020  . Malignant neoplasm of unspecified part of unspecified bronchus or lung ([place]) 02/22/2020  . Cough 02/12/2020  . Rib pain on left side 11/29/2019  . Compression fracture of T6 vertebra ([place]) 11/05/2019  . Weight loss 07/29/2019  . Age-related osteoporosis without current pathological fracture 05/21/2019  . Compression fracture of T5 vertebra ([place]) 05/06/2019  . Port-A-Cath in  place 05/06/2019  . Closed stable burst fracture of fourth lumbar vertebra ([place]) 06/08/2018  . Severe protein-calorie malnutrition ([place]) 10/29/2017  . Sepsis ([place]) 04/28/2017  . Small cell lung cancer, right upper lobe ([place]) 02/23/2017  . Goals of care, counseling/discussion 02/23/2017  . Lung mass   . AAA (abdominal aortic aneurysm) without rupture ([place]) 11/02/2016  . COPD mixed type ([place]) 11/02/2016  . Hyperlipidemia 11/02/2016  . PAD (peripheral artery disease) ([place]) 11/02/2016     Past Surgical History:  Procedure Laterality Date  . ABDOMINAL HYSTERECTOMY    . ARTERY BIOPSY Right 12/05/2017   Procedure: BIOPSY TEMPORAL ARTERY;  Surgeon: Margaretha Sheffield, MD;  Location: [facility] ORS;  Service: ENT;  Laterality: Right;  . ENDOBRONCHIAL ULTRASOUND N/A 02/19/2017   Procedure: ENDOBRONCHIAL ULTRASOUND;  Surgeon: Flora Lipps, MD;  Location: [facility] ORS;  Service: Cardiopulmonary;  Laterality: N/A;  . ENDOVASCULAR REPAIR/STENT GRAFT N/A 12/06/2016   Procedure: Endovascular Repair/Stent Graft;  Surgeon: Katha Cabal, MD;  Location: [facility];  Service: Cardiovascular;  Laterality: N/A;  . IR FLUORO GUIDE PORT INSERTION RIGHT  03/02/2017  . KYPHOPLASTY N/A 06/11/2018   Procedure: KYPHOPLASTY L4 BIOPSY WITH RFA;  Surgeon: Hessie Knows, MD;  Location: [facility] ORS;  Service: Orthopedics;  Laterality: N/A;  . KYPHOPLASTY N/A 12/16/2019   Procedure: T6 KYPHOPLASTY;  Surgeon: Hessie Knows, MD;  Location: [facility] ORS;  Service: Orthopedics;  Laterality: N/A;  . MASTECTOMY Left 2002   with sentinel node  Prior to Admission medications   Medication Sig Start Date End Date Taking? Authorizing Provider  acetaminophen (TYLENOL) 325 MG tablet Take 650 mg by mouth every 6 (six) hours as needed for mild pain.     [provider]  albuterol (PROVENTIL HFA;VENTOLIN HFA) 108 (90 Base) MCG/ACT inhaler Inhale 2 puffs into the lungs every 6 (six) hours as needed for wheezing or shortness of  breath. 03/23/16   Frederich Cha, MD  atorvastatin (LIPITOR) 10 MG tablet Take 10 mg by mouth at bedtime.  08/11/16   [provider]  fluticasone-salmeterol (ADVAIR HFA) 115-21 MCG/ACT inhaler Inhale 2 puffs into the lungs 2 (two) times daily.    [provider]  HYDROcodone-acetaminophen (NORCO) 5-325 MG tablet Take 1 tablet by mouth every 6 (six) hours as needed for moderate pain. Patient not taking: No sig reported 12/16/19   Hessie Knows, MD  lidocaine-prilocaine (EMLA) cream Apply 1 application topically as needed. Patient taking differently: Apply 1 application topically as needed (port access). 11/05/19   Lequita Asal, MD  Multiple Vitamin (MULTIVITAMIN) tablet Take 1 tablet by mouth daily.    [provider]  omeprazole (PRILOSEC) 20 MG capsule TAKE ONE CAPSULE BY MOUTH ONCE DAILY 07/27/20   Lequita Asal, MD     Allergies Patient has no known allergies.   Family History  Problem Relation Age of Onset  . CAD Mother   . Colon cancer Brother   . Breast cancer Neg Hx     Social History Social History   Tobacco Use  . Smoking status: Current Every Day Smoker    Packs/day: 1.50    Years: 50.00    Pack years: 75.00    Types: Cigarettes  . Smokeless tobacco: Never Used  Vaping Use  . Vaping Use: Never used  Substance Use Topics  . Alcohol use: No  . Drug use: No    Review of Systems Level 5 Caveat: Portions of the History and Physical including HPI and review of systems are unable to be completely obtained due to patient being a poor historian   Constitutional:   No known fever.  ENT:   No rhinorrhea. Cardiovascular:   No chest pain or syncope. Respiratory:   No dyspnea or cough. Gastrointestinal:   Negative for abdominal pain, vomiting and diarrhea.  Musculoskeletal:   Negative for focal pain or swelling ____________________________________________   PHYSICAL EXAM:  VITAL SIGNS: ED Triage Vitals  Enc Vitals Group     BP  09/07/20 0914 (!) 165/87     Pulse Rate 09/07/20 0914 (!) 102     Resp 09/07/20 0914 20     Temp --      Temp src --      SpO2 09/07/20 0914 96 %     Weight 09/07/20 0910 150 lb (68 kg)     Height 09/07/20 0910 5\' 5"  (1.651 m)     Head Circumference --      Peak Flow --      Pain Score 09/07/20 0910 0     Pain Loc --      Pain Edu? --      Excl. in [place]? --     Vital signs reviewed, nursing assessments reviewed.   Constitutional:   Alert, oriented to self.. Non-toxic appearance. Eyes:   Conjunctivae are normal. EOMI. PERRL. ENT      Head:   Normocephalic and atraumatic.      Nose:   No congestion/rhinnorhea.  Mouth/Throat:   Dry mucous membranes, no pharyngeal erythema. No peritonsillar mass.       Neck:   No meningismus. Full ROM. Hematological/Lymphatic/Immunilogical:   No cervical lymphadenopathy. Cardiovascular:   RRR. Symmetric bilateral radial and DP pulses.  No murmurs. Cap refill less than 2 seconds. Respiratory:   Normal respiratory effort without tachypnea/retractions. Breath sounds are clear and equal bilaterally. No wheezes/rales/rhonchi. Gastrointestinal:   Soft and nontender. Non distended. There is no CVA tenderness.  No rebound, rigidity, or guarding.  Musculoskeletal:   Normal range of motion in all extremities. No joint effusions.  No lower extremity tenderness.  No edema. Neurologic:   Incoherent speech. Motor grossly intact. No acute focal neurologic deficits are appreciated.  Skin:    Skin is warm, dry and intact. No rash noted.  No petechiae, purpura, or bullae.  ____________________________________________    LABS (pertinent positives/negatives) (all labs ordered are listed, but only abnormal results are displayed) Labs Reviewed  BASIC METABOLIC PANEL - Abnormal; Notable for the following components:      Result Value   Sodium 134 (*)    Chloride 97 (*)    Glucose, Bld 111 (*)    BUN 24 (*)    Creatinine, Ser 1.37 (*)    Calcium 8.8 (*)     GFR, Estimated 41 (*)    All other components within normal limits  TROPONIN I (HIGH SENSITIVITY) - Abnormal; Notable for the following components:   Troponin I (High Sensitivity) 50 (*)    All other components within normal limits  RESP PANEL BY RT-PCR (FLU A&B, COVID) ARPGX2  CBC WITH DIFFERENTIAL/PLATELET  TROPONIN I (HIGH SENSITIVITY)   ____________________________________________   EKG Interpreted by me Sinus tachycardia rate 105.  Normal axis and intervals.  Normal QRS ST segments and T waves.  No ischemic changes.   ____________________________________________    RADIOLOGY  CT Head Wo Contrast  Result Date: 09/06/2020 CLINICAL DATA:  Mental status change, unknown cause altered and confused. nonfocal. hx lung cancer. eval signs of cva, mets, ich EXAM: CT HEAD WITHOUT CONTRAST TECHNIQUE: Contiguous axial images were obtained from the base of the skull through the vertex without intravenous contrast. COMPARISON:  Head CT 11/19/2017.  Brain MRI 02/12/2018 FINDINGS: Brain: Mild motion artifact limitations. Stable degree of atrophy and chronic small vessel ischemia. No evidence of mass or mass effect on this noncontrast motion limited exam. No hemorrhage or subdural collection. No evidence of acute ischemia. No hydrocephalus. Vascular: Atherosclerosis of skullbase vasculature without hyperdense vessel or abnormal calcification. Skull: No fracture or focal lesion. Sinuses/Orbits: Mild mucosal thickening and debris involving posterior right ethmoid air cell. Paranasal sinuses otherwise clear. Hypoplastic left mastoid air cells. No mastoid effusion. No acute orbital findings. Other: None. IMPRESSION: 1. No acute intracranial abnormality. Mild motion artifact limitations. 2. Stable atrophy and chronic small vessel ischemia. Electronically Signed   By: Keith Rake M.D.   On: 09/06/2020 20:57   DG Chest Portable 1 View  Result Date: 09/06/2020 CLINICAL DATA:  Altered mental status. EXAM:  PORTABLE CHEST 1 VIEW COMPARISON:  February 12, 2020 FINDINGS: There is stable left-sided venous Port-A-Cath positioning. The lungs are hyperinflated. Stable right upper lobe and right suprahilar scarring and postoperative changes are seen. Stable right apical pleural thickening is also noted. There is no evidence of a pleural effusion or pneumothorax. The heart size and mediastinal contours are within normal limits. Radiopaque surgical clips are seen overlying the lateral aspect of the mid left chest wall. Prior vertebroplasty is  noted at the level of T6 with degenerative changes seen throughout the remainder of the thoracic spine. IMPRESSION: 1. Stable right upper lobe and right suprahilar scarring and postoperative changes without acute cardiopulmonary disease. Electronically Signed   By: Virgina Norfolk M.D.   On: 09/06/2020 20:25    ____________________________________________   PROCEDURES Procedures  ____________________________________________  DIFFERENTIAL DIAGNOSIS   Dehydration, electrolyte abnormality, metastatic disease in the brain, stroke  CLINICAL IMPRESSION / ASSESSMENT AND PLAN / ED COURSE  Medications ordered in the ED: Medications  0.9 %  sodium chloride infusion ( Intravenous New Bag/Given 09/07/20 1224)  ondansetron (ZOFRAN) injection 4 mg (has no administration in time range)  acetaminophen (TYLENOL) tablet 650 mg (has no administration in time range)  acetaminophen (TYLENOL) suppository 650 mg (has no administration in time range)  nicotine (NICODERM CQ - dosed in mg/24 hours) patch 21 mg (has no administration in time range)  albuterol (PROVENTIL) (2.5 MG/3ML) 0.083% nebulizer solution 2.5 mg (has no administration in time range)  dextromethorphan-guaiFENesin (MUCINEX DM) 30-600 MG per 12 hr tablet 1 tablet (has no administration in time range)  sodium chloride 0.9 % bolus 500 mL (0 mLs Intravenous Stopped 09/07/20 1116)    Pertinent labs & imaging results that  were available during my care of the patient were reviewed by me and considered in my medical decision making (see chart for details).   Roberta [name] was evaluated in Emergency Department on 09/07/2020 for the symptoms described in the history of present illness. She was evaluated in the context of the global COVID-19 pandemic, which necessitated consideration that the patient might be at risk for infection with the SARS-CoV-2 virus that causes COVID-19. Institutional protocols and algorithms that pertain to the evaluation of patients at risk for COVID-19 are in a state of rapid change based on information released by regulatory bodies including the CDC and federal and state organizations. These policies and algorithms were followed during the patient's care in the ED.   Patient presents with worsening confusion which is an acute change over the last 24 hours.  Patient is outside of any window for TPA or endovascular intervention for possible stroke.  No focal neurologic deficits to suggest LVO.  Had extensive work-up yesterday including CT head and labs and urinalysis which were all reassuring except for some evidence of dehydration with increased creatinine.  We will give IV fluids, case discussed with hospitalist for further management given persistent altered mental status.      ____________________________________________   FINAL CLINICAL IMPRESSION(S) / ED DIAGNOSES    Final diagnoses:  Altered mental status, unspecified altered mental status type  Elevated troponin  Dehydration     ED Discharge Orders    None      Portions of this note were generated with dragon dictation software. Dictation errors may occur despite best attempts at proofreading.   Carrie Mew, MD 09/07/20 1233

## 2020-09-07 NOTE — ED Triage Notes (Signed)
Pt to ED via POV with c/o AMS, slurred speech and confusion. Pt seen yesterday for same, pt's daughter reports symptoms started yesterday, began to resolve after "Iv's and fluids" and patient was discharged, pt awoke this morning with same symptoms as yesterday per daughter, pt is confused from baseline.   Pt's daughter states her number is in the chart, unable to stay with patient today, requests a phone call if able.

## 2020-09-07 NOTE — ED Triage Notes (Signed)
Pt arrives to ER, dropped off by family member who states was seen in ER for same thing yesterday. Had a full workup. States confusion, AMS but states had resolved some last night so was DC. States woke up this AM with same symptoms. Pt is alert to self. Not alert to location. Pt not alert to situation either. Denies pain.

## 2020-09-07 NOTE — H&P (Addendum)
History and Physical    Roberta [name] DGL:[id number] DOB: [birth year] DOA: 09/07/2020  Referring MD/NP/PA:   PCP: Gayland Curry, MD   Patient coming from:  The patient is coming from home.  At baseline, pt is independent for most of ADL.        Chief Complaint: AMS  HPI: Roberta [name] is a 75 y.o. female with medical history significant of SCLC (s/p of surgery, chemotherapy and brain radiation therapy), HLD, COPD, GERD, tobacco abuse, breast CA (s/p of bilateral mastectomy, radiation and chemotherapy), AAA, anemia, PAD, who presents with AMS.  Per her daughter (I called her daughter by phone), pt was confused and seen in ED yesterday.  At her normal baseline patient is alert, oriented x3.  No history of dementia.  She had negative work-up including negative urinalysis, negative bain metastasis by CT of head and negative pneumonia on chest x-ray.  She received IVF with improved mental status and was discharged home. Pt awoke this morning with same symptoms as yesterday.  Patient is confused, disorientated, with some slurred speech.  Has poor balance.  Patient has history of COPD with mild cough and mild shortness breath which has not worsened.  No chest pain, fever or chills.  No nausea, vomiting, diarrhea or abdominal pain.  Not complaining symptoms of UTI per her daughter.  ED Course: pt was found to have WBC 8.1, trop 19 -->50, negative Covid PCR, AKI with creatinine 1.37, BUN 24 (creatinine 0.84 on 05/04/2020), temperature normal, blood pressure 150/90, heart rate of 114, 103, RR 27, oxygen saturation initially 85% on room air, currently 97% on room air.  Chest x-ray showed stable right upper lobe and right suprahilar scarring and postoperative changes without acute cardiopulmonary disease on 09/06/20.    Review of Systems: Could not be accurately reviewed due to altered mental status.  Allergy: No Known Allergies  Past Medical History:  Diagnosis Date  . AAA (abdominal aortic  aneurysm) ([place])   . Anemia   . Breast cancer ([place]) 2002   left  . COPD (chronic obstructive pulmonary disease) ([place])   . Dyspnea   . Headache   . High cholesterol   . Personal history of chemotherapy   . Small cell lung cancer ([place])   . Tobacco abuse     Past Surgical History:  Procedure Laterality Date  . ABDOMINAL HYSTERECTOMY    . ARTERY BIOPSY Right 12/05/2017   Procedure: BIOPSY TEMPORAL ARTERY;  Surgeon: Margaretha Sheffield, MD;  Location: [facility] ORS;  Service: ENT;  Laterality: Right;  . ENDOBRONCHIAL ULTRASOUND N/A 02/19/2017   Procedure: ENDOBRONCHIAL ULTRASOUND;  Surgeon: Flora Lipps, MD;  Location: [facility] OR[facility];  Service: Cardiopulmonary;  Laterality: N/A;  . ENDOVASCULAR REPAIR/STENT GRAFT N/A 12/06/2016   Procedure: Endovascular Repair/Stent Graft;  Surgeon: Katha Cabal, MD;  Location: [facility];  Service: Cardiovascular;  Laterality: N/A;  . IR FLUORO GUIDE PORT INSERTION RIGHT  03/02/2017  . KYPHOPLASTY N/A 06/11/2018   Procedure: KYPHOPLASTY L4 BIOPSY WITH RFA;  Surgeon: Hessie Knows, MD;  Location: [facility];  Service: Orthopedics;  Laterality: N/A;  . KYPHOPLASTY N/A 12/16/2019   Procedure: T6 KYPHOPLASTY;  Surgeon: Hessie Knows, MD;  Location: [facility];  Service: Orthopedics;  Laterality: N/A;  . MASTECTOMY Left 2002   with sentinel node     Social History:  reports that she has been smoking cigarettes. She has a 75.00 pack-year smoking history. She has never used smokeless tobacco. She reports that she does not drink  alcohol and does not use drugs.  Family History:  Family History  Problem Relation Age of Onset  . CAD Mother   . Colon cancer Brother   . Breast cancer Neg Hx      Prior to Admission medications   Medication Sig Start Date End Date Taking? Authorizing Provider  acetaminophen (TYLENOL) 325 MG tablet Take 650 mg by mouth every 6 (six) hours as needed for mild pain.     [provider]  albuterol (PROVENTIL HFA;VENTOLIN HFA) 108  (90 Base) MCG/ACT inhaler Inhale 2 puffs into the lungs every 6 (six) hours as needed for wheezing or shortness of breath. 03/23/16   Frederich Cha, MD  atorvastatin (LIPITOR) 10 MG tablet Take 10 mg by mouth at bedtime.  08/11/16   [provider]  fluticasone-salmeterol (ADVAIR HFA) 115-21 MCG/ACT inhaler Inhale 2 puffs into the lungs 2 (two) times daily.    [provider]  HYDROcodone-acetaminophen (NORCO) 5-325 MG tablet Take 1 tablet by mouth every 6 (six) hours as needed for moderate pain. Patient not taking: No sig reported 12/16/19   Hessie Knows, MD  lidocaine-prilocaine (EMLA) cream Apply 1 application topically as needed. Patient taking differently: Apply 1 application topically as needed (port access). 11/05/19   Lequita Asal, MD  Multiple Vitamin (MULTIVITAMIN) tablet Take 1 tablet by mouth daily.    [provider]  omeprazole (PRILOSEC) 20 MG capsule TAKE ONE CAPSULE BY MOUTH ONCE DAILY 07/27/20   Lequita Asal, MD    Physical Exam: Vitals:   09/07/20 0915 09/07/20 1022 09/07/20 1119 09/07/20 1328  BP: (!) 165/87 (!) 154/98 (!) 150/90 (!) 144/87  Pulse: (!) 102 (!) 114 (!) 103 85  Resp: (!) 27 19 (!) 25 20  SpO2: 95% 97% 97% 100%  Weight:      Height:       General: Not in acute distress HEENT:       Eyes: PERRL, EOMI, no scleral icterus.       ENT: No discharge from the ears and nose       Neck: No JVD, no bruit, no mass felt. Heme: No neck lymph node enlargement. Cardiac: S1/S2, RRR, No murmurs, No gallops or rubs. Respiratory: No rales, wheezing, rhonchi or rubs. GI: Soft, nondistended, nontender, no organomegaly, BS present. GU: No hematuria Ext: No pitting leg edema bilaterally. 1+DP/PT pulse bilaterally. Musculoskeletal: No joint deformities, No joint redness or warmth, no limitation of ROM in spin. Skin: No rashes.  Neuro: Confused, disorientated, not oriented X3, cranial nerves II-XII grossly intact, moves all extremities  normally.  Psych: Patient is not psychotic, no suicidal or hemocidal ideation.  Labs on Admission: I have personally reviewed following labs and imaging studies  CBC: Recent Labs  Lab 09/06/20 2000 09/07/20 0945  WBC 6.4 8.1  NEUTROABS 4.2 6.2  HGB 14.1 12.6  HCT 43.1 38.1  MCV 90.4 88.4  PLT 253 062   Basic Metabolic Panel: Recent Labs  Lab 09/06/20 2000 09/07/20 0945  NA 136 134*  K 3.5 3.5  CL 98 97*  CO2 26 26  GLUCOSE 135* 111*  BUN 28* 24*  CREATININE 1.57* 1.37*  CALCIUM 9.5 8.8*   GFR: Estimated Creatinine Clearance: 32.4 mL/min (A) (by C-G formula based on SCr of 1.37 mg/dL (H)). Liver Function Tests: Recent Labs  Lab 09/06/20 2000  AST 23  ALT 14  ALKPHOS 93  BILITOT 0.5  PROT 7.9  ALBUMIN 4.1   No results for input(s): LIPASE, AMYLASE  in the last 168 hours. No results for input(s): AMMONIA in the last 168 hours. Coagulation Profile: No results for input(s): INR, PROTIME in the last 168 hours. Cardiac Enzymes: No results for input(s): CKTOTAL, CKMB, CKMBINDEX, TROPONINI in the last 168 hours. BNP (last 3 results) No results for input(s): PROBNP in the last 8760 hours. HbA1C: No results for input(s): HGBA1C in the last 72 hours. CBG: No results for input(s): GLUCAP in the last 168 hours. Lipid Profile: No results for input(s): CHOL, HDL, LDLCALC, TRIG, CHOLHDL, LDLDIRECT in the last 72 hours. Thyroid Function Tests: No results for input(s): TSH, T4TOTAL, FREET4, T3FREE, THYROIDAB in the last 72 hours. Anemia Panel: No results for input(s): VITAMINB12, FOLATE, FERRITIN, TIBC, IRON, RETICCTPCT in the last 72 hours. Urine analysis:    Component Value Date/Time   COLORURINE STRAW (A) 09/06/2020 1954   APPEARANCEUR CLEAR (A) 09/06/2020 1954   LABSPEC 1.011 09/06/2020 1954   PHURINE 7.0 09/06/2020 1954   GLUCOSEU NEGATIVE 09/06/2020 1954   HGBUR SMALL (A) 09/06/2020 1954   BILIRUBINUR NEGATIVE 09/06/2020 [place] NEGATIVE 09/06/2020  1954   PROTEINUR NEGATIVE 09/06/2020 1954   NITRITE NEGATIVE 09/06/2020 1954   LEUKOCYTESUR NEGATIVE 09/06/2020 1954   Sepsis Labs: @LABRCNTIP (procalcitonin:4,lacticidven:4) ) Recent Results (from the past 240 hour(s))  Resp Panel by RT-PCR (Flu A&B, Covid) Nasopharyngeal Swab     Status: None   Collection Time: 09/07/20  9:45 AM   Specimen: Nasopharyngeal Swab; Nasopharyngeal(NP) swabs in vial transport medium  Result Value Ref Range Status   SARS Coronavirus 2 by RT PCR NEGATIVE NEGATIVE Final    Comment: (NOTE) SARS-CoV-2 target nucleic acids are NOT DETECTED.  The SARS-CoV-2 RNA is generally detectable in upper respiratory specimens during the acute phase of infection. The lowest concentration of SARS-CoV-2 viral copies this assay can detect is 138 copies/mL. A negative result does not preclude SARS-Cov-2 infection and should not be used as the sole basis for treatment or other patient management decisions. A negative result may occur with  improper specimen collection/handling, submission of specimen other than nasopharyngeal swab, presence of viral mutation(s) within the areas targeted by this assay, and inadequate number of viral copies(<138 copies/mL). A negative result must be combined with clinical observations, patient history, and epidemiological information. The expected result is Negative.  Fact Sheet for Patients:  EntrepreneurPulse.com.au  Fact Sheet for Healthcare Providers:  IncredibleEmployment.be  This test is no t yet approved or cleared by the [place] FDA and  has been authorized for detection and/or diagnosis of SARS-CoV-2 by FDA under an Emergency Use Authorization (EUA). This EUA will remain  in effect (meaning this test can be used) for the duration of the COVID-19 declaration under Section 564(b)(1) of the Act, 21 U.S.C.section 360bbb-3(b)(1), unless the authorization is terminated  or revoked sooner.        Influenza A by PCR NEGATIVE NEGATIVE Final   Influenza B by PCR NEGATIVE NEGATIVE Final    Comment: (NOTE) The Xpert Xpress SARS-CoV-2/FLU/RSV plus assay is intended as an aid in the diagnosis of influenza from Nasopharyngeal swab specimens and should not be used as a sole basis for treatment. Nasal washings and aspirates are unacceptable for Xpert Xpress SARS-CoV-2/FLU/RSV testing.  Fact Sheet for Patients: EntrepreneurPulse.com.au  Fact Sheet for Healthcare Providers: IncredibleEmployment.be  This test is not yet approved or cleared by the [place] FDA and has been authorized for detection and/or diagnosis of SARS-CoV-2 by FDA under an Emergency Use Authorization (EUA). This EUA will remain  in effect (meaning this test can be used) for the duration of the COVID-19 declaration under Section 564(b)(1) of the Act, 21 U.S.C. section 360bbb-3(b)(1), unless the authorization is terminated or revoked.  Performed at [facility], [address] [place], [place] [address]      Radiological Exams on Admission: CT Head Wo Contrast  Result Date: 09/06/2020 CLINICAL DATA:  Mental status change, unknown cause altered and confused. nonfocal. hx lung cancer. eval signs of cva, mets, ich EXAM: CT HEAD WITHOUT CONTRAST TECHNIQUE: Contiguous axial images were obtained from the base of the skull through the vertex without intravenous contrast. COMPARISON:  Head CT 11/19/2017.  Brain MRI 02/12/2018 FINDINGS: Brain: Mild motion artifact limitations. Stable degree of atrophy and chronic small vessel ischemia. No evidence of mass or mass effect on this noncontrast motion limited exam. No hemorrhage or subdural collection. No evidence of acute ischemia. No hydrocephalus. Vascular: Atherosclerosis of skullbase vasculature without hyperdense vessel or abnormal calcification. Skull: No fracture or focal lesion. Sinuses/Orbits: Mild mucosal thickening and  debris involving posterior right ethmoid air cell. Paranasal sinuses otherwise clear. Hypoplastic left mastoid air cells. No mastoid effusion. No acute orbital findings. Other: None. IMPRESSION: 1. No acute intracranial abnormality. Mild motion artifact limitations. 2. Stable atrophy and chronic small vessel ischemia. Electronically Signed   By: Keith Rake M.D.   On: 09/06/2020 20:57   DG Chest Portable 1 View  Result Date: 09/06/2020 CLINICAL DATA:  Altered mental status. EXAM: PORTABLE CHEST 1 VIEW COMPARISON:  February 12, 2020 FINDINGS: There is stable left-sided venous Port-A-Cath positioning. The lungs are hyperinflated. Stable right upper lobe and right suprahilar scarring and postoperative changes are seen. Stable right apical pleural thickening is also noted. There is no evidence of a pleural effusion or pneumothorax. The heart size and mediastinal contours are within normal limits. Radiopaque surgical clips are seen overlying the lateral aspect of the mid left chest wall. Prior vertebroplasty is noted at the level of T6 with degenerative changes seen throughout the remainder of the thoracic spine. IMPRESSION: 1. Stable right upper lobe and right suprahilar scarring and postoperative changes without acute cardiopulmonary disease. Electronically Signed   By: Virgina Norfolk M.D.   On: 09/06/2020 20:25     EKG: I have personally reviewed.  Sinus rhythm, QTC 497, LVH, nonspecific T wave change  Assessment/Plan Principal Problem:   Acute metabolic encephalopathy Active Problems:   COPD mixed type (HCC)   Hyperlipidemia   Small cell lung cancer, right upper lobe (HCC)   Elevated troponin   AKI (acute kidney injury) (HCC)   GERD (gastroesophageal reflux disease)   Tobacco abuse   Acute metabolic encephalopathy: Etiology is not clear.  No history of dementia per her daughter.  This is acute change.  CT head negative for acute intracranial abnormalities yesterday.  Patient has poor  balance and slurred speech, will get MRI to rule out stroke.  -Place in med-surg bed for obs -Frequent neuro check -MRI of the brain with and without contrast -Hold Norco  COPD mixed type [facility]): Stable -Bronchodilators  Hyperlipidemia -Lipitor  Small cell lung cancer, right upper lobe [facility]): S/p of surgery, chemotherapy and brain radiation therapy, currently not on chemotherapy.  In remission - follow-up with Dr. Mike Gip  Elevated troponin: Troponin level 19 yesterday, 50 --> 52 today.  No chest pain.  Possibly due to demand ischemia.  Patient had a one episode of oxygen desaturation to 85% in ED, currently 97% on room air.  Patient has a  tachycardia, will need to rule out PE -Trend troponin -Start aspirin 81 mg daily -Continue Lipitor -A1c, FLP -Follow-up D-dimer, if positive, will get CT angiogram to rule out PE  Addendum: D-dimer is positive -will get CTA to r/o PE -->negative for PE -will get LE doppler  AKI (acute kidney injury) [facility]): Creatinine 1.3 7, BUN 24.  Possibly due to dehydration.  Urinalysis negative yesterday -IV fluid: 500 cc normal saline x2, then 100 cc/h -Follow-up with BMP  GERD (gastroesophageal reflux disease) -Protonix  Tobacco abuse: -Nicotine patch     DVT ppx: SQ Lovenox Code Status: DNR per her daughter Family Communication:   Yes, patient's daugher at bed side Disposition Plan:  Anticipate discharge back to previous environment Consults called:  none Admission status and Level of care: Med-Surg:    for obs   Status is: Observation  The patient remains OBS appropriate and will d/c before 2 midnights.  Dispo: The patient is from: Home              Anticipated d/c is to: Home              Patient currently is not medically stable to d/c.   Difficult to place patient No          Date of Service 09/07/2020    [facility]   If 7PM-7AM, please contact night-coverage [email or web address] 09/07/2020, 2:13 PM

## 2020-09-07 NOTE — ED Notes (Signed)
Pt placed on bed alarm, yellow socks, bed in low position, rails up

## 2020-09-07 NOTE — ED Notes (Signed)
Pt extremely restless. Unable to keep monitoring equip on. Pt is not trying to get oob

## 2020-09-07 NOTE — Discharge Instructions (Signed)
As we discussed, if she has any further episodes of focal confusion, difficulty speaking or strokelike symptoms, please return to the ED.    Otherwise, please follow-up with your PCP in the next 1-2 weeks to have her blood rechecked to ensure her kidney number returns back to baseline after the fluids we provided her.

## 2020-09-08 ENCOUNTER — Observation Stay: Payer: Medicare HMO

## 2020-09-08 DIAGNOSIS — N179 Acute kidney failure, unspecified: Secondary | ICD-10-CM | POA: Diagnosis not present

## 2020-09-08 DIAGNOSIS — G9341 Metabolic encephalopathy: Secondary | ICD-10-CM | POA: Diagnosis not present

## 2020-09-08 DIAGNOSIS — J449 Chronic obstructive pulmonary disease, unspecified: Secondary | ICD-10-CM | POA: Diagnosis not present

## 2020-09-08 LAB — RETICULOCYTES
Immature Retic Fract: 16.3 % — ABNORMAL HIGH (ref 2.3–15.9)
RBC.: 4.06 MIL/uL (ref 3.87–5.11)
Retic Count, Absolute: 72.3 10*3/uL (ref 19.0–186.0)
Retic Ct Pct: 1.8 % (ref 0.4–3.1)

## 2020-09-08 LAB — FERRITIN: Ferritin: 43 ng/mL (ref 11–307)

## 2020-09-08 LAB — BASIC METABOLIC PANEL
Anion gap: 11 (ref 5–15)
BUN: 19 mg/dL (ref 8–23)
CO2: 24 mmol/L (ref 22–32)
Calcium: 8.6 mg/dL — ABNORMAL LOW (ref 8.9–10.3)
Chloride: 102 mmol/L (ref 98–111)
Creatinine, Ser: 1.4 mg/dL — ABNORMAL HIGH (ref 0.44–1.00)
GFR, Estimated: 39 mL/min — ABNORMAL LOW (ref >60)
Glucose, Bld: 72 mg/dL (ref 70–99)
Potassium: 3.3 mmol/L — ABNORMAL LOW (ref 3.5–5.1)
Sodium: 137 mmol/L (ref 135–145)

## 2020-09-08 LAB — HEMOGLOBIN A1C
Hgb A1c MFr Bld: 5.6 % (ref 4.8–5.6)
Mean Plasma Glucose: 114.02 mg/dL

## 2020-09-08 LAB — CBC
HCT: 35.5 % — ABNORMAL LOW (ref 36.0–46.0)
Hemoglobin: 11.6 g/dL — ABNORMAL LOW (ref 12.0–15.0)
MCH: 29.2 pg (ref 26.0–34.0)
MCHC: 32.7 g/dL (ref 30.0–36.0)
MCV: 89.4 fL (ref 80.0–100.0)
Platelets: 229 10*3/uL (ref 150–400)
RBC: 3.97 MIL/uL (ref 3.87–5.11)
RDW: 13.9 % (ref 11.5–15.5)
WBC: 6.5 10*3/uL (ref 4.0–10.5)
nRBC: 0 % (ref 0.0–0.2)

## 2020-09-08 LAB — LIPID PANEL
Cholesterol: 162 mg/dL (ref 0–200)
HDL: 49 mg/dL (ref >40)
LDL Cholesterol: 93 mg/dL (ref 0–99)
Total CHOL/HDL Ratio: 3.3 RATIO
Triglycerides: 98 mg/dL (ref <150)
VLDL: 20 mg/dL (ref 0–40)

## 2020-09-08 LAB — FOLATE: Folate: 19.9 ng/mL (ref >5.9)

## 2020-09-08 LAB — VITAMIN B12: Vitamin B-12: 123 pg/mL — ABNORMAL LOW (ref 180–914)

## 2020-09-08 LAB — IRON AND TIBC
Iron: 71 ug/dL (ref 28–170)
Saturation Ratios: 25 % (ref 10.4–31.8)
TIBC: 284 ug/dL (ref 250–450)
UIBC: 213 ug/dL

## 2020-09-08 LAB — GLUCOSE, CAPILLARY: Glucose-Capillary: 76 mg/dL (ref 70–99)

## 2020-09-08 LAB — TSH: TSH: 1.301 u[IU]/mL (ref 0.350–4.500)

## 2020-09-08 LAB — T4, FREE: Free T4: 1.42 ng/dL — ABNORMAL HIGH (ref 0.61–1.12)

## 2020-09-08 MED ORDER — POTASSIUM CHLORIDE CRYS ER 20 MEQ PO TBCR
40.0000 meq | EXTENDED_RELEASE_TABLET | Freq: Once | ORAL | Status: AC
  Administered 2020-09-08: 40 meq via ORAL
  Filled 2020-09-08: qty 2

## 2020-09-08 MED ORDER — CYANOCOBALAMIN 1000 MCG/ML IJ SOLN
1000.0000 ug | Freq: Every day | INTRAMUSCULAR | Status: DC
  Administered 2020-09-09: 1000 ug via INTRAMUSCULAR
  Filled 2020-09-08 (×2): qty 1

## 2020-09-08 MED ORDER — GADOBUTROL 1 MMOL/ML IV SOLN: 6.0000 mL | Freq: Once | INTRAVENOUS | Status: DC | PRN

## 2020-09-08 MED ORDER — DIAZEPAM 2 MG PO TABS
2.0000 mg | ORAL_TABLET | ORAL | Status: AC
  Administered 2020-09-08: 2 mg via ORAL
  Filled 2020-09-08: qty 1

## 2020-09-08 NOTE — Progress Notes (Signed)
Patient is on her way down to MRI.

## 2020-09-08 NOTE — Progress Notes (Signed)
[facility] PROGRESS NOTE   Roberta [name] KWI:[id number] DOB: 1946/02/11 DOA: 09/07/2020  PCP: Gayland Curry, MD  Brief History/Interval Summary: 75 y.o. female with medical history significant of SCLC (s/p of surgery, chemotherapy and brain radiation therapy), HLD, COPD, GERD, tobacco abuse, breast CA (s/p of bilateral mastectomy, radiation and chemotherapy), AAA, anemia, PAD, who presented with altered mental status.  Patient's daughter denied any history of cognitive impairment or dementia.  Daughter gives the impression that the patient's mental status changes were acute.  Patient was hospitalized for further management.    Consultants: None yet  Procedures: EEG has been ordered  Antibiotics: Anti-infectives (From admission, onward)   None      Subjective/Interval History: Patient noted to be mildly distracted.  Her daughter and granddaughter at the bedside.  Patient denies any pain or discomfort.  No weakness in any 1 side of her body.     Assessment/Plan:  Acute metabolic encephalopathy Etiology is unclear.  CT head was negative for acute process.  MRI was a poor study but did not show any acute stroke or any other acute findings.  Severe white matter disease was noted.  Discussed with patient's daughter.  Based on history provided it appears the patient has been having waxing and waning symptoms.  EEG will be ordered.  We will also do metabolic work-up.  PT and OT evaluation.  Patient's daughter was told that it is quite possible patient has underlying cognitive impairment which was not apparent up until now.  Avoid sedating medications.  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  Currently not on chemotherapy.  Mildly elevated troponin Insignificant elevation noted.  Patient denies any chest pain or shortness of breath.  She was noted to be tachycardic yesterday so she underwent CT  angiogram which did not show any pulmonary embolism.  Lower extremity Doppler studies were negative for DVT.  Do not anticipate any further work-up. EKG did not show any ischemic changes.   Acute kidney injury/hyperkalemia Baseline creatinine less than 1.0.  Presented with creatinine of 1.57.  BUN was also noted to be elevated.  Continue gentle IV hydration for now.  Monitor urine output.   Potassium noted to be low and will be repleted.  History of COPD Stable.  Hyperlipidemia Continue statin.  GERD Protonix  Tobacco abuse Nicotine patch.   DVT Prophylaxis: Lovenox Code Status: DNR Family Communication: Discussed with the patient's daughter Disposition Plan: PT and OT evaluation.  Hopefully return home when improved.  Status is: Observation  The patient will require care spanning > 2 midnights and should be moved to inpatient because: Altered mental status, Ongoing diagnostic testing needed not appropriate for outpatient work up and Inpatient level of care appropriate due to severity of illness  Dispo: The patient is from: Home              Anticipated d/c is to: Home              Patient currently is not medically stable to d/c.   Difficult to place patient No       Medications:  Scheduled: . aspirin EC  81 mg Oral Daily  . atorvastatin  10 mg Oral QHS  . enoxaparin (LOVENOX) injection  40 mg Subcutaneous Q24H  . ipratropium-albuterol  3 mL Nebulization TID  . mometasone-formoterol  2 puff Inhalation BID  . nicotine  21 mg Transdermal Daily  . pantoprazole  40 mg Oral Daily   Continuous: . sodium chloride 100 mL/hr at 09/07/20 2001   SEG:[id number], acetaminophen, albuterol, dextromethorphan-guaiFENesin, gadobutrol, ondansetron (ZOFRAN) IV   Objective:  Vital Signs  Vitals:   09/07/20 2256 09/08/20 0543 09/08/20 0740 09/08/20 0935  BP: (!) 156/75 (!) 159/88  (!) 139/92  Pulse: (!) 101 98  (!) 101  Resp: 20   18  Temp: 97.8 F (36.6 C) 97.9 F (36.6  C)  (!) 97.5 F (36.4 C)  TempSrc: Oral Oral  Oral  SpO2: 96% 95% 93% 96%  Weight:      Height:        Intake/Output Summary (Last 24 hours) at 09/08/2020 1153 Last data filed at 09/08/2020 1017 Gross per 24 hour  Intake 620 ml  Output --  Net 620 ml   Filed Weights   09/07/20 0910  Weight: 68 kg    General appearance: Awake alert.  In no distress.  Mildly distracted Resp: Clear to auscultation bilaterally.  Normal effort Cardio: S1-S2 is normal regular.  No S3-S4.  No rubs murmurs or bruit GI: Abdomen is soft.  Nontender nondistended.  Bowel sounds are present normal.  No masses organomegaly Extremities: No edema.  Full range of motion of lower extremities. Neurologic: She is alert.  Oriented to place person year month.  No focal neurological deficits.    Lab Results:  Data Reviewed: I have personally reviewed following labs and imaging studies  CBC: Recent Labs  Lab 09/06/20 2000 09/07/20 0945 09/08/20 0452  WBC 6.4 8.1 6.5  NEUTROABS 4.2 6.2  --   HGB 14.1 12.6 11.6*  HCT 43.1 38.1 35.5*  MCV 90.4 88.4 89.4  PLT 253 249 062    Basic Metabolic Panel: Recent Labs  Lab 09/06/20 2000 09/07/20 0945 09/08/20 0452  NA 136 134* 137  K 3.5 3.5 3.3*  CL 98 97* 102  CO2 26 26 24   GLUCOSE 135* 111* 72  BUN 28* 24* 19  CREATININE 1.57* 1.37* 1.40*  CALCIUM 9.5 8.8* 8.6*    GFR: Estimated Creatinine Clearance: 31.7 mL/min (A) (by C-G formula based on SCr of 1.4 mg/dL (H)).  Liver Function Tests: Recent Labs  Lab 09/06/20 2000  AST 23  ALT 14  ALKPHOS 93  BILITOT 0.5  PROT 7.9  ALBUMIN 4.1    HbA1C: Recent Labs    09/08/20 0452  HGBA1C 5.6    CBG: Recent Labs  Lab 09/08/20 0735  GLUCAP 76    Lipid Profile: Recent Labs    09/08/20 0452  CHOL 162  HDL 49  LDLCALC 93  TRIG 98  CHOLHDL 3.3    Recent Results (from the past 240 hour(s))  Resp Panel by RT-PCR (Flu A&B, Covid) Nasopharyngeal Swab     Status: None   Collection Time:  09/07/20  9:45 AM   Specimen: Nasopharyngeal Swab; Nasopharyngeal(NP) swabs in vial transport medium  Result Value Ref Range Status   SARS Coronavirus 2 by RT PCR NEGATIVE NEGATIVE Final    Comment: (NOTE) SARS-CoV-2 target nucleic acids are NOT DETECTED.  The SARS-CoV-2 RNA is generally detectable in upper respiratory specimens during the acute phase of infection. The lowest concentration of SARS-CoV-2 viral copies this assay can detect is 138 copies/mL. A negative result does not preclude SARS-Cov-2 infection and should not be used as the sole basis for treatment or other patient management decisions. A negative result may occur with  improper specimen collection/handling, submission of specimen other than nasopharyngeal swab, presence of viral  mutation(s) within the areas targeted by this assay, and inadequate number of viral copies(<138 copies/mL). A negative result must be combined with clinical observations, patient history, and epidemiological information. The expected result is Negative.  Fact Sheet for Patients:  EntrepreneurPulse.com.au  Fact Sheet for Healthcare Providers:  IncredibleEmployment.be  This test is no t yet approved or cleared by the [place] FDA and  has been authorized for detection and/or diagnosis of SARS-CoV-2 by FDA under an Emergency Use Authorization (EUA). This EUA will remain  in effect (meaning this test can be used) for the duration of the COVID-19 declaration under Section 564(b)(1) of the Act, 21 U.S.C.section 360bbb-3(b)(1), unless the authorization is terminated  or revoked sooner.       Influenza A by PCR NEGATIVE NEGATIVE Final   Influenza B by PCR NEGATIVE NEGATIVE Final    Comment: (NOTE) The Xpert Xpress SARS-CoV-2/FLU/RSV plus assay is intended as an aid in the diagnosis of influenza from Nasopharyngeal swab specimens and should not be used as a sole basis for treatment. Nasal washings  and aspirates are unacceptable for Xpert Xpress SARS-CoV-2/FLU/RSV testing.  Fact Sheet for Patients: EntrepreneurPulse.com.au  Fact Sheet for Healthcare Providers: IncredibleEmployment.be  This test is not yet approved or cleared by the [place] FDA and has been authorized for detection and/or diagnosis of SARS-CoV-2 by FDA under an Emergency Use Authorization (EUA). This EUA will remain in effect (meaning this test can be used) for the duration of the COVID-19 declaration under Section 564(b)(1) of the Act, 21 U.S.C. section 360bbb-3(b)(1), unless the authorization is terminated or revoked.  Performed at [facility], [address], [place], [place] [address]       Radiology Studies: CT Head Wo Contrast  Result Date: 09/06/2020 CLINICAL DATA:  Mental status change, unknown cause altered and confused. nonfocal. hx lung cancer. eval signs of cva, mets, ich EXAM: CT HEAD WITHOUT CONTRAST TECHNIQUE: Contiguous axial images were obtained from the base of the skull through the vertex without intravenous contrast. COMPARISON:  Head CT 11/19/2017.  Brain MRI 02/12/2018 FINDINGS: Brain: Mild motion artifact limitations. Stable degree of atrophy and chronic small vessel ischemia. No evidence of mass or mass effect on this noncontrast motion limited exam. No hemorrhage or subdural collection. No evidence of acute ischemia. No hydrocephalus. Vascular: Atherosclerosis of skullbase vasculature without hyperdense vessel or abnormal calcification. Skull: No fracture or focal lesion. Sinuses/Orbits: Mild mucosal thickening and debris involving posterior right ethmoid air cell. Paranasal sinuses otherwise clear. Hypoplastic left mastoid air cells. No mastoid effusion. No acute orbital findings. Other: None. IMPRESSION: 1. No acute intracranial abnormality. Mild motion artifact limitations. 2. Stable atrophy and chronic small vessel ischemia.  Electronically Signed   By: Keith Rake M.D.   On: 09/06/2020 20:57   CT ANGIO CHEST PE W OR WO CONTRAST  Result Date: 09/07/2020 CLINICAL DATA:  Chest pain and shortness of breath. Prior history of lung cancer. EXAM: CT ANGIOGRAPHY CHEST WITH CONTRAST TECHNIQUE: Multidetector CT imaging of the chest was performed using the standard protocol during bolus administration of intravenous contrast. Multiplanar CT image reconstructions and MIPs were obtained to evaluate the vascular anatomy. CONTRAST:  101mL OMNIPAQUE IOHEXOL 350 MG/ML SOLN COMPARISON:  05/04/2020 FINDINGS: Cardiovascular: The heart is normal in size. No pericardial effusion. The aorta is normal in caliber. No dissection. Moderate atherosclerotic calcifications. Stable coronary artery calcifications. The pulmonary arterial tree is fairly well opacified. No filling defects are identified to suggest pulmonary embolism. Mediastinum/Nodes: Stable extensive radiation changes involving  the right paramediastinal lung. No enlarged mediastinal or hilar lymph nodes. The esophagus is grossly normal. Lungs/Pleura: Stable extensive radiation changes involving the right upper lobe without findings suspicious for recurrent tumor. Stable biapical pleural and parenchymal scarring. No acute pulmonary process is identified. No findings suspicious for pulmonary metastatic disease. Upper Abdomen: Stable aortic calcifications and right renal artery stent. No upper abdominal metastatic disease is identified. Musculoskeletal: Stable surgical changes from a left mastectomy. Left-sided Port-A-Cath is noted. The bony thorax is intact. Remote vertebral augmentation changes in the midthoracic spine. No acute bony abnormalities or worrisome bone lesions. Review of the MIP images confirms the above findings. IMPRESSION: 1. No CT findings for pulmonary embolism. 2. Stable extensive radiation changes involving the right upper lobe without findings suspicious for recurrent tumor  or metastatic disease. 3. Stable biapical pleural and parenchymal scarring. 4. No acute pulmonary findings. 5. Stable surgical changes from a left mastectomy. 6. Aortic atherosclerosis. Aortic Atherosclerosis (ICD10-I70.0). Aortic Atherosclerosis (ICD10-I70.0). Electronically Signed   By: Marijo Sanes M.D.   On: 09/07/2020 18:31   MR BRAIN WO CONTRAST  Result Date: 09/08/2020 CLINICAL DATA:  Mental status change, unknown cause. Additional history provided: Patient with history of SCLS (status post surgery, chemotherapy and brain radiation therapy), hyperlipidemia, COPD, GERD, tobacco abuse, breast cancer (status post bilateral mastectomy, radiation and chemotherapy), AAA, anemia, PAD. EXAM: MRI HEAD WITHOUT CONTRAST TECHNIQUE: Multiplanar, multiecho pulse sequences of the brain and surrounding structures were obtained without intravenous contrast. COMPARISON:  Head CT 09/06/2020.  Brain MRI 02/12/2018. FINDINGS: Brain: The patient was unable to tolerate the full examination due to altered mental status. The examination was prematurely terminated due to severe motion degradation. Only axial diffusion-weighted imaging, coronal diffusion-weighted imaging, an axial T2/FLAIR sequence and an axial T2* sequence could be obtained. The acquired axial T2/FLAIR sequence is severely motion degraded. The axial T2* sequence is moderately motion degraded. Mild cerebral atrophy. No evidence of acute infarction. Severe patchy and confluent T2/FLAIR hyperintensity within the cerebral white matter, nonspecific but likely reflecting a combination of post treatment changes and chronic small vessel ischemic disease. Chronic small vessel ischemic changes within the brainstem. No intracranial mass, chronic intracranial blood products or extra-axial fluid collection is identified. No midline shift. Vascular: Poorly assessed on the acquired sequences. Skull and upper cervical spine: No focal calvarial marrow lesion is identified on the  acquired sequences. Sinuses/Orbits: Orbits and paranasal sinuses poorly assessed on the acquired sequences. IMPRESSION: Prematurely terminated, significantly motion degraded and limited examination as described. Postcontrast imaging could not be obtained and this examination is inadequate to exclude intracranial metastatic disease. The diffusion-weighted imaging is of good quality. No evidence of acute infarction. Within described limitations, no acute intracranial abnormality is identified. Severe cerebral white matter signal abnormality, nonspecific but likely reflecting a combination of post-treatment changes and chronic small vessel ischemic disease. Chronic small vessel ischemic disease is also present within the pons. Electronically Signed   By: Kellie Simmering DO   On: 09/08/2020 07:58   US Venous Img Lower Bilateral (DVT)  Result Date: 09/07/2020 CLINICAL DATA:  Positive D-dimer. EXAM: BILATERAL LOWER EXTREMITY VENOUS DOPPLER ULTRASOUND TECHNIQUE: Gray-scale sonography with graded compression, as well as color Doppler and duplex ultrasound were performed to evaluate the lower extremity deep venous systems from the level of the common femoral vein and including the common femoral, femoral, profunda femoral, popliteal and calf veins including the posterior tibial, peroneal and gastrocnemius veins when visible. The superficial great saphenous vein was also interrogated. Spectral Doppler  was utilized to evaluate flow at rest and with distal augmentation maneuvers in the common femoral, femoral and popliteal veins. COMPARISON:  None. FINDINGS: RIGHT LOWER EXTREMITY Common Femoral Vein: No evidence of thrombus. Normal compressibility, respiratory phasicity and response to augmentation. Saphenofemoral Junction: No evidence of thrombus. Normal compressibility and flow on color Doppler imaging. Profunda Femoral Vein: No evidence of thrombus. Normal compressibility and flow on color Doppler imaging. Femoral Vein: No  evidence of thrombus. Normal compressibility, respiratory phasicity and response to augmentation. Popliteal Vein: No evidence of thrombus. Normal compressibility, respiratory phasicity and response to augmentation. Calf Veins: No evidence of thrombus. Normal compressibility and flow on color Doppler imaging. Superficial Great Saphenous Vein: No evidence of thrombus. Normal compressibility. Venous Reflux:  None. LEFT LOWER EXTREMITY Common Femoral Vein: No evidence of thrombus. Normal compressibility, respiratory phasicity and response to augmentation. Saphenofemoral Junction: No evidence of thrombus. Normal compressibility and flow on color Doppler imaging. Profunda Femoral Vein: No evidence of thrombus. Normal compressibility and flow on color Doppler imaging. Femoral Vein: No evidence of thrombus. Normal compressibility, respiratory phasicity and response to augmentation. Popliteal Vein: No evidence of thrombus. Normal compressibility, respiratory phasicity and response to augmentation. Calf Veins: No evidence of thrombus. Normal compressibility and flow on color Doppler imaging. Superficial Great Saphenous Vein: No evidence of thrombus. Normal compressibility. Venous Reflux:  None. IMPRESSION: No evidence of deep venous thrombosis in either lower extremity. Electronically Signed   By: Margaretha Sheffield MD   On: 09/07/2020 17:12   DG Chest Portable 1 View  Result Date: 09/06/2020 CLINICAL DATA:  Altered mental status. EXAM: PORTABLE CHEST 1 VIEW COMPARISON:  February 12, 2020 FINDINGS: There is stable left-sided venous Port-A-Cath positioning. The lungs are hyperinflated. Stable right upper lobe and right suprahilar scarring and postoperative changes are seen. Stable right apical pleural thickening is also noted. There is no evidence of a pleural effusion or pneumothorax. The heart size and mediastinal contours are within normal limits. Radiopaque surgical clips are seen overlying the lateral aspect of the  mid left chest wall. Prior vertebroplasty is noted at the level of T6 with degenerative changes seen throughout the remainder of the thoracic spine. IMPRESSION: 1. Stable right upper lobe and right suprahilar scarring and postoperative changes without acute cardiopulmonary disease. Electronically Signed   By: Virgina Norfolk M.D.   On: 09/06/2020 20:25       LOS: 0 days   [facility] Pager on [email or web address]  09/08/2020, 11:53 AM

## 2020-09-08 NOTE — Progress Notes (Signed)
Patient went down for MRI but was unable to sit through the scan. NP Randol Kern notified and valium ordered to be given 1 hour before patient goes back down.

## 2020-09-08 NOTE — Evaluation (Signed)
Physical Therapy Evaluation Patient Details Name: DEVONA HOLMES MRN: [id number] DOB: [birth year] Today's Date: 09/08/2020   History of Present Illness  CHRYSTAL ZEIMET is a 75 y.o. female with medical history significant of SCLC (s/p of surgery, chemotherapy and brain radiation therapy), HLD, COPD, GERD, tobacco abuse, breast CA (s/p of bilateral mastectomy, radiation and chemotherapy), AAA, anemia, PAD, who presents with AMS.  Clinical Impression  Patient received in bed, in odd position. Patient is very [employer]. Agreeable to PT session. She is independent with bed mobility. Min guard/supervision for sit to stand. Ambulated 20 feet with RW and 20 feet without AD. Patient is safer with RW as she furniture walked when did not have device. She is oriented, but seems to have some confusion/distraction. Patient will continue to benefit from skilled PT while here to improve functional independence and safety with mobility.     Follow Up Recommendations Home health PT    Equipment Recommendations  None recommended by PT    Recommendations for Other Services       Precautions / Restrictions Precautions Precautions: Fall Restrictions Weight Bearing Restrictions: No      Mobility  Bed Mobility Overal bed mobility: Modified Independent                  Transfers Overall transfer level: Modified independent Equipment used: Rolling walker (2 wheeled)             General transfer comment: patient does not require physical assist but benefits from supervision  Ambulation/Gait Ambulation/Gait assistance: Min guard Gait Distance (Feet): 40 Feet Assistive device: Rolling walker (2 wheeled);None Gait Pattern/deviations: Step-through pattern Gait velocity: decr   General Gait Details: Patient ambulated 20 feet with RW and 20 feet without AD. She requires single UE support on counter, walls, etc when not using AD. Patient has decreased safety awareness with ambulation and  benefits from min guard/supervision.  Stairs            Wheelchair Mobility    Modified Rankin (Stroke Patients Only)       Balance                                             Pertinent Vitals/Pain Pain Assessment: No/denies pain    Home Living Family/patient expects to be discharged to:: Private residence Living Arrangements: Children Available Help at Discharge: Family;Available 24 hours/day Type of Home: House       Home Layout: One level Home Equipment: Walker - 2 wheels      Prior Function Level of Independence: Independent         Comments: Patient reports she has a walker, does not use at baseline     Hand Dominance        Extremity/Trunk Assessment   Upper Extremity Assessment Upper Extremity Assessment: Generalized weakness    Lower Extremity Assessment Lower Extremity Assessment: Generalized weakness    Cervical / Trunk Assessment Cervical / Trunk Assessment: Normal  Communication   Communication: HOH  Cognition Arousal/Alertness: Awake/alert Behavior During Therapy: WFL for tasks assessed/performed Overall Cognitive Status: No family/caregiver present to determine baseline cognitive functioning                                 General Comments: Patient is alert, oriented to place, month, person.  General Comments      Exercises     Assessment/Plan    PT Assessment Patient needs continued PT services  PT Problem List Decreased strength;Decreased mobility;Decreased activity tolerance;Decreased balance;Decreased safety awareness       PT Treatment Interventions DME instruction;Therapeutic exercise;Gait training;Stair training;Functional mobility training;Therapeutic activities;Patient/family education    PT Goals (Current goals can be found in the Care Plan section)  Acute Rehab PT Goals Patient Stated Goal: to return home PT Goal Formulation: With patient Time For Goal Achievement:  09/22/20 Potential to Achieve Goals: Good    Frequency Min 2X/week   Barriers to discharge        Co-evaluation               AM-PAC PT "6 Clicks" Mobility  Outcome Measure Help needed turning from your back to your side while in a flat bed without using bedrails?: None Help needed moving from lying on your back to sitting on the side of a flat bed without using bedrails?: None Help needed moving to and from a bed to a chair (including a wheelchair)?: A Little Help needed standing up from a chair using your arms (e.g., wheelchair or bedside chair)?: A Little Help needed to walk in hospital room?: A Little Help needed climbing 3-5 steps with a railing? : A Lot 6 Click Score: 19    End of Session Equipment Utilized During Treatment: Gait belt Activity Tolerance: Patient tolerated treatment well Patient left: in bed;with call bell/phone within reach;with bed alarm set Nurse Communication: Mobility status PT Visit Diagnosis: Other abnormalities of gait and mobility (R26.89);Muscle weakness (generalized) (M62.81);Difficulty in walking, not elsewhere classified (R26.2)    Time: 1345-1401 PT Time Calculation (min) (ACUTE ONLY): 16 min   Charges:   PT Evaluation $PT Eval Moderate Complexity: 1 Mod PT Treatments $Gait Training: 8-22 mins        [employer], PT, [employer] 09/08/20,2:13 PM

## 2020-09-08 NOTE — Progress Notes (Signed)
eeg done °

## 2020-09-08 NOTE — Evaluation (Signed)
Occupational Therapy Evaluation Patient Details Name: Roberta [name] MRN: [id number] DOB: 03-25-1946 Today's Date: 09/08/2020    History of Present Illness Roberta [name] is a 75 y.o. female with medical history significant of SCLC (s/p of surgery, chemotherapy and brain radiation therapy), HLD, COPD, GERD, tobacco abuse, breast CA (s/p of bilateral mastectomy, radiation and chemotherapy), AAA, anemia, PAD, who presents with AMS.   Clinical Impression   Roberta [name] was seen for OT evaluation this date. Prior to hospital admission, pt was Independent for mobility and ADLs. Pt lives with family. Pt presents to acute OT demonstrating impaired ADL performance and functional mobility 2/2 poor safety awareness and functional strength/balance/endurance deficits. Orietnted to self and place as [place], unable to identify building as a hopsital when given categorical choices. Not orietnted to situation. Demonstrates poor safety awareness t/o session - following toileting pt ambulates ~3 steps with underwear around her ankles prior to realizing and attempting to don in standing.  Pt currently requires MIN A for toilet t/f w/o AD. CGA for hygiene lateral leans on commode. CGA don underwear in standing. MOD I don/doff B socks seated EOB. Pt would benefit from skilled OT to address noted impairments and functional limitations (see below for any additional details) in order to maximize safety and independence while minimizing falls risk and caregiver burden. Upon hospital discharge, recommend HHOT to maximize pt safety and return to functional independence during meaningful occupations of daily life.     Follow Up Recommendations  Home health OT;Supervision/Assistance - 24 hour    Equipment Recommendations  3 in 1 bedside commode    Recommendations for Other Services       Precautions / Restrictions Precautions Precautions: Fall Restrictions Weight Bearing Restrictions: No      Mobility  Bed Mobility Overal bed mobility: Modified Independent                  Transfers Overall transfer level: Needs assistance Equipment used: None Transfers: Sit to/from Stand Sit to Stand: Min guard         General transfer comment: w/o AD pt requires hands on CGA + L hand rail    Balance Overall balance assessment: Needs assistance Sitting-balance support: No upper extremity supported;Feet supported Sitting balance-Leahy Scale: Good     Standing balance support: No upper extremity supported;During functional activity Standing balance-Leahy Scale: Fair                             ADL either performed or assessed with clinical judgement   ADL Overall ADL's : Needs assistance/impaired                                       General ADL Comments: MIN A for toilet t/f w/o AD. CGA for hygiene lateral leans on commode. CGA don underwear in standing. MOD I don/doff B socks seated EOB                  Pertinent Vitals/Pain Pain Assessment: No/denies pain     Hand Dominance     Extremity/Trunk Assessment Upper Extremity Assessment Upper Extremity Assessment: Generalized weakness   Lower Extremity Assessment Lower Extremity Assessment: Generalized weakness   Cervical / Trunk Assessment Cervical / Trunk Assessment: Normal   Communication Communication Communication: HOH   Cognition Arousal/Alertness: Awake/alert Behavior During Therapy: WFL for tasks assessed/performed Overall Cognitive  Status: No family/caregiver present to determine baseline cognitive functioning                                 General Comments: Orietnted to place as Roberta [name], unable to identify building as a hopsital when given categorical choices. Not orietnted to situation. Demonstrates poor safety awareness t/o - ambulates ~3 steps with underwear around her ankles prior to realizing and attempting to don in standing      Exercises Exercises:  Other exercises Other Exercises Other Exercises: Pt educated re: OT role, DME recs, d/c recs, falls prevention, ECS, importance of supervision for mobility Other Exercises: LBD, toileting, sup<>sit, sit<>stand, sitting/standing balance/tolerance, ~20 ft mobility   Shoulder Instructions      Home Living Family/patient expects to be discharged to:: Private residence Living Arrangements: Children Available Help at Discharge: Family Type of Home: [place]: One level               Home Equipment: Environmental consultant - 2 wheels          Prior Functioning/Environment Level of Independence: Independent        Comments: Patient reports she has a walker, does not use at baseline        OT Problem List: Decreased strength;Decreased activity tolerance;Impaired balance (sitting and/or standing);Decreased safety awareness;Decreased knowledge of use of DME or AE      OT Treatment/Interventions: Self-care/ADL training;Therapeutic exercise;Energy conservation;DME and/or AE instruction;Therapeutic activities;Patient/family education;Balance training    OT Goals(Current goals can be found in the care plan section) Acute Rehab OT Goals Patient Stated Goal: to return home OT Goal Formulation: With patient Time For Goal Achievement: 09/22/20 Potential to Achieve Goals: Good ADL Goals Pt Will Perform Grooming: Independently;standing Pt Will Perform Lower Body Dressing: Independently;sit to/from stand Pt Will Transfer to Toilet: Independently;ambulating;regular height toilet  OT Frequency: Min 1X/week    AM-PAC OT "6 Clicks" Daily Activity     Outcome Measure Help from another person eating meals?: None Help from another person taking care of personal grooming?: A Little Help from another person toileting, which includes using toliet, bedpan, or urinal?: A Little Help from another person bathing (including washing, rinsing, drying)?: A Little Help from another person to put on  and taking off regular upper body clothing?: A Little Help from another person to put on and taking off regular lower body clothing?: A Little 6 Click Score: 19   End of Session Nurse Communication: Mobility status  Activity Tolerance: Patient tolerated treatment well Patient left: in bed;with call bell/phone within reach;with bed alarm set  OT Visit Diagnosis: Other abnormalities of gait and mobility (R26.89);Muscle weakness (generalized) (M62.81)                Time: 1439-1450 OT Time Calculation (min): 11 min Charges:  OT General Charges $OT Visit: 1 Visit OT Evaluation $OT Eval Low Complexity: 1 Low  Dessie Coma, M.S. OTR/L  09/08/20, 3:37 PM  ascom [phone]

## 2020-09-08 NOTE — Procedures (Signed)
[facility] EEG  HISTORY:  75 year old female presents with altered mental status.  INTRODUCTION:  A digital EEG was performed in the laboratory using the standard international 10/20 system of electrode placement in addition to one channel of EKG monitoring.  Hyperventilation was not performed. Photic Stimulation was performed.  This tracing captures wakefulness through drowsiness.  This EEG was recording for approximately 22 minutes.  DESCRIPTION OF RECORD:  In the most alert state the alpha rhythm is 8 Hz in frequency, which is seen in the occipital region and attenuates with eye opening and is bilaterally synchronous and symmetrical.  Muscle artifact is seen at times. No focal slowing, sharp waves, or epileptiform discharges are seen.  Photic stimulation was performed and did not produce any abnormalities.  Heart rate was regular at a rate of 100 bpm.  IMPRESSION:  This is a normal adult EEG in the awake and drowsy states. No focal slowing, focal abnormalities, epileptiform discharges, or electrographic seizures are seen.  Of note, a normal EEG does not exclude the diagnosis of seizure disorder.  A repeat sleep deprived EEG or 24 hour EEG could be considered.  Clinical correlation is recommended.

## 2020-09-08 NOTE — Progress Notes (Signed)
PIV # 22 Placed R wrist. (1) attempt. Patient tolerated well. IV secured.

## 2020-09-09 DIAGNOSIS — E538 Deficiency of other specified B group vitamins: Secondary | ICD-10-CM

## 2020-09-09 LAB — CBC
HCT: 35.2 % — ABNORMAL LOW (ref 36.0–46.0)
Hemoglobin: 11.7 g/dL — ABNORMAL LOW (ref 12.0–15.0)
MCH: 29.5 pg (ref 26.0–34.0)
MCHC: 33.2 g/dL (ref 30.0–36.0)
MCV: 88.7 fL (ref 80.0–100.0)
Platelets: 249 10*3/uL (ref 150–400)
RBC: 3.97 MIL/uL (ref 3.87–5.11)
RDW: 14 % (ref 11.5–15.5)
WBC: 7.2 10*3/uL (ref 4.0–10.5)
nRBC: 0 % (ref 0.0–0.2)

## 2020-09-09 LAB — BASIC METABOLIC PANEL
Anion gap: 10 (ref 5–15)
BUN: 24 mg/dL — ABNORMAL HIGH (ref 8–23)
CO2: 26 mmol/L (ref 22–32)
Calcium: 8.8 mg/dL — ABNORMAL LOW (ref 8.9–10.3)
Chloride: 102 mmol/L (ref 98–111)
Creatinine, Ser: 1.41 mg/dL — ABNORMAL HIGH (ref 0.44–1.00)
GFR, Estimated: 39 mL/min — ABNORMAL LOW (ref >60)
Glucose, Bld: 98 mg/dL (ref 70–99)
Potassium: 3.8 mmol/L (ref 3.5–5.1)
Sodium: 138 mmol/L (ref 135–145)

## 2020-09-09 LAB — GLUCOSE, CAPILLARY: Glucose-Capillary: 100 mg/dL — ABNORMAL HIGH (ref 70–99)

## 2020-09-09 MED ORDER — HALOPERIDOL LACTATE 5 MG/ML IJ SOLN
1.0000 mg | Freq: Four times a day (QID) | INTRAMUSCULAR | Status: DC | PRN
  Administered 2020-09-09: 1 mg via INTRAMUSCULAR
  Filled 2020-09-09: qty 1

## 2020-09-09 MED ORDER — SODIUM CHLORIDE 0.9% FLUSH
10.0000 mL | Freq: Two times a day (BID) | INTRAVENOUS | Status: DC
  Administered 2020-09-09: 10 mL

## 2020-09-09 MED ORDER — SODIUM CHLORIDE 0.9% FLUSH: 10.0000 mL | INTRAVENOUS | Status: DC | PRN

## 2020-09-09 MED ORDER — ASPIRIN 81 MG PO TBEC: 81.0000 mg | DELAYED_RELEASE_TABLET | Freq: Every day | ORAL | 11 refills | Status: DC

## 2020-09-09 MED ORDER — TRAZODONE HCL 50 MG PO TABS
25.0000 mg | ORAL_TABLET | Freq: Every evening | ORAL | Status: DC | PRN
  Filled 2020-09-09: qty 1

## 2020-09-09 MED ORDER — HEPARIN SOD (PORK) LOCK FLUSH 100 UNIT/ML IV SOLN
500.0000 [IU] | INTRAVENOUS | Status: AC | PRN
  Administered 2020-09-09: 500 [IU]
  Filled 2020-09-09: qty 5

## 2020-09-09 MED ORDER — VITAMIN B-12 1000 MCG PO TABS: 1000.0000 ug | ORAL_TABLET | Freq: Every day | ORAL | 3 refills | Status: AC

## 2020-09-09 MED ORDER — CHLORHEXIDINE GLUCONATE CLOTH 2 % EX PADS
6.0000 | MEDICATED_PAD | Freq: Every day | CUTANEOUS | Status: DC
  Administered 2020-09-09: 6 via TOPICAL

## 2020-09-09 NOTE — TOC Initial Note (Signed)
Transition of Care [facility]) - Initial/Assessment Note    Patient Details  Name: EULONDA ANDALON MRN: [id number] Date of Birth: May 02, 1946  [facility] CM/SW Contact:    Beverly Sessions, RN Phone Number: 09/09/2020, 5:01 PM  Clinical Narrative:                 Patient admitted from home  Lives at home with daughter Patient A&O x2.  Assessment completed via phone with daughter  PCP [facility]  Denies issues obtaining medications.  Family provides transport to appointments  PT has assessed patient and recommends home health PT. Daughter in agreement and does not have preference of home health agency. Referral made to [facility] for PT and oT.  RW and BSC delivered to room by Faythe Dingwall from [employer]   Expected Discharge Plan: [place] Barriers to Discharge: No Barriers Identified   Patient Goals and CMS Choice        Expected Discharge Plan and Services Expected Discharge Plan: [place]   Discharge Planning Services: CM Consult   Living arrangements for the past 2 months: Single Family Home Expected Discharge Date: 09/09/20                         HH Arranged: PT,OT [place] Agency: [place] Date [facility] Agency Contacted: 09/09/20   Representative spoke with at [place]: Tommi Rumps  Prior Living Arrangements/Services Living arrangements for the past 2 months: [place] Lives with:: Adult Children Patient language and need for interpreter reviewed:: Yes Do you feel safe going back to the place where you live?: Yes      Need for Family Participation in Patient Care: Yes (Comment) Care giver support system in place?: Yes (comment) Current home services: DME Criminal Activity/Legal Involvement Pertinent to Current Situation/Hospitalization: No - Comment as needed  Activities of Daily Living Home Assistive Devices/Equipment: Other (Comment) (Rolling wlaker sometimes) ADL Screening  (condition at time of admission) Patient's cognitive ability adequate to safely complete daily activities?: No Is the patient deaf or have difficulty hearing?: Yes Does the patient have difficulty seeing, even when wearing glasses/contacts?: No Does the patient have difficulty concentrating, remembering, or making decisions?: Yes Patient able to express need for assistance with ADLs?: Yes Does the patient have difficulty dressing or bathing?: Yes Independently performs ADLs?: No Does the patient have difficulty walking or climbing stairs?: Yes Weakness of Legs: Both Weakness of Arms/Hands: Both  Permission Sought/Granted                  Emotional Assessment       Orientation: : Oriented to Self,Oriented to Place Alcohol / Substance Use: Not Applicable Psych Involvement: No (comment)  Admission diagnosis:  Dehydration [E86.0] Positive D-dimer [R79.89] Elevated troponin [R77.8] Altered mental status, unspecified altered mental status type [I69.62] Acute metabolic encephalopathy [X52.84] Patient Active Problem List   Diagnosis Date Noted  . Acute metabolic encephalopathy 13/24/4010  . Elevated troponin 09/07/2020  . AKI (acute kidney injury) ([place]) 09/07/2020  . GERD (gastroesophageal reflux disease) 09/07/2020  . Tobacco abuse 09/07/2020  . Malignant neoplasm of unspecified part of unspecified bronchus or lung ([place]) 02/22/2020  . Cough 02/12/2020  . Rib pain on left side 11/29/2019  . Compression fracture of T6 vertebra ([place]) 11/05/2019  . Weight loss 07/29/2019  . Age-related osteoporosis without current pathological fracture 05/21/2019  . Compression fracture of T5  vertebra ([place]) 05/06/2019  . Port-A-Cath in place 05/06/2019  . Closed stable burst fracture of fourth lumbar vertebra ([place]) 06/08/2018  . Severe protein-calorie malnutrition ([place]) 10/29/2017  . Sepsis ([place]) 04/28/2017  . Small cell lung cancer, right upper lobe ([place]) 02/23/2017  . Goals of care,  counseling/discussion 02/23/2017  . Lung mass   . AAA (abdominal aortic aneurysm) without rupture ([place]) 11/02/2016  . COPD mixed type ([place]) 11/02/2016  . Hyperlipidemia 11/02/2016  . PAD (peripheral artery disease) ([place]) 11/02/2016   PCP:  Gayland Curry, MD Pharmacy:   [place], [place] - [address] [place] [address] Phone: [phone] Fax: [phone]     Social Determinants of Health (SDOH) Interventions    Readmission Risk Interventions No flowsheet data found.

## 2020-09-09 NOTE — Discharge Instructions (Signed)
Vitamin B12 Deficiency Vitamin B12 deficiency means that your body does not have enough vitamin B12. The body needs this vitamin:  To make red blood cells.  To make genes (DNA).  To help the nerves work. If you do not have enough vitamin B12 in your body, you can have health problems. What are the causes?  Not eating enough foods that contain vitamin B12.  Not being able to absorb vitamin B12 from the food that you eat.  Certain digestive system diseases.  A condition in which the body does not make enough of a certain protein, which results in too few red blood cells (pernicious anemia).  Having a surgery in which part of the stomach or small intestine is removed.  Taking medicines that make it hard for the body to absorb vitamin B12. These medicines include: ? Heartburn medicines. ? Some antibiotic medicines. ? Other medicines that are used to treat certain conditions. What increases the risk?  Being older than age 64.  Eating a vegetarian or vegan diet, especially while you are pregnant.  Eating a poor diet while you are pregnant.  Taking certain medicines.  Having alcoholism. What are the signs or symptoms? In some cases, there are no symptoms. If the condition leads to too few blood cells or nerve damage, symptoms can occur, such as:  Feeling weak.  Feeling tired (fatigued).  Not being hungry.  Weight loss.  A loss of feeling (numbness) or tingling in your hands and feet.  Redness and burning of the tongue.  Being mixed up (confused) or having memory problems.  Sadness (depression).  Problems with your senses. This can include color blindness, ringing in the ears, or loss of taste.  Watery poop (diarrhea) or trouble pooping (constipation).  Trouble walking. If anemia is very bad, symptoms can include:  Being short of breath.  Being dizzy.  Having a very fast heartbeat. How is this treated?  Changing the way you eat and drink, such  as: ? Eating more foods that contain vitamin B12. ? Drinking little or no alcohol.  Getting vitamin B12 shots.  Taking vitamin B12 supplements. Your doctor will tell you the dose that is best for you. Follow these instructions at home: Eating and drinking  Eat lots of healthy foods that contain vitamin B12. These include: ? Meats and poultry, such as beef, pork, chicken, [place], and organ meats, such as liver. ? Seafood, such as clams, rainbow trout, salmon, tuna, and haddock. ? Eggs. ? Cereal and dairy products that have vitamin B12 added to them. Check the label. The items listed above may not be a complete list of what you can eat and drink. Contact a dietitian for more options.   General instructions  Get any shots as told by your doctor.  Take supplements only as told by your doctor.  Do not drink alcohol if your doctor tells you not to. In some cases, you may only be asked to limit alcohol use.  Keep all follow-up visits as told by your doctor. This is important. Contact a doctor if:  Your symptoms come back. Get help right away if:  You have trouble breathing.  You have a very fast heartbeat.  You have chest pain.  You get dizzy.  You pass out. Summary  Vitamin B12 deficiency means that your body is not getting enough vitamin B12.  In some cases, there are no symptoms of this condition.  Treatment may include making a change in the way you eat and  drink, getting vitamin B12 shots, or taking supplements.  Eat lots of healthy foods that contain vitamin B12. This information is not intended to replace advice given to you by your health care provider. Make sure you discuss any questions you have with your health care provider. Document Revised: 01/22/2018 Document Reviewed: 01/22/2018 Elsevier Patient Education  2021 [employer].

## 2020-09-09 NOTE — Progress Notes (Signed)
Patient is currently without an IV. The last one placed stopped working and started bleed. Dr. Sidney Ace notified and we will have the AM team take a look.

## 2020-09-09 NOTE — Discharge Summary (Signed)
[facility]  Physician Discharge Summary   Patient ID: Roberta [name] MRN: [id number] DOB/AGE: [birth year] 75 y.o.  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 Consider referral to neurology for further evaluation. 2. Please repeat thyroid function tests in 3 to 4 weeks. 3. Recheck B12 level in 3 weeks.   Home Health: PT OT Equipment/Devices: None  CODE STATUS: DNR  DISCHARGE CONDITION: fair  Diet recommendation: As before  INITIAL HISTORY: 75 y.o.femalewith medical history significant ofSCLC (s/p of surgery, chemotherapy and brain radiation therapy), HLD, COPD, GERD, tobacco abuse, breast CA(s/p of bilateral mastectomy, radiation and chemotherapy), AAA, anemia, PAD, who presented with altered mental status.  Patient's daughter denied any history of cognitive impairment or dementia.  Daughter gives the impression that the patient's mental status changes were acute.  Patient was hospitalized for further management.    Consultants: None yet  Procedures: EEG IMPRESSION:  This is a normal adult EEG in the awake and drowsy states. No focal slowing, focal abnormalities, epileptiform discharges, or electrographic seizures are seen.  Of note, a normal EEG does not exclude the diagnosis of seizure disorder.  A repeat sleep deprived EEG or 24 hour EEG could be considered.  Clinical correlation is recommended.   HOSPITAL COURSE:    Acute metabolic encephalopathy/vitamin B12 deficiency Etiology is unclear.  CT head was negative for acute process.  MRI was a poor study but did not show any acute stroke or any other acute findings.  Severe white matter disease was noted.  Discussed with  patient's daughter.  Based on history provided it appears the patient has been having waxing and waning symptoms.    So an EEG was ordered which did not show any epileptiform activity or seizure activity.  Metabolic work-up was also initiated which showed a very low vitamin B12 level.  Supplementation was initiated.   TSH was normal but free T4 was mildly elevated.  Will not initiate any treatment at this time for this since she does not have any symptoms suggestive of hyperthyroidism..  Will need repeat thyroid function tests in a few weeks.  Patient's daughter was told that it is quite possible patient has underlying cognitive impairment which was not apparent up until now.    They were also discussed with patient's PCP about referral to neurology.  Vitamin B12 deficiency See above.  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  Currently not on chemotherapy.  Mildly elevated troponin Insignificant elevation noted.  Patient denies any chest pain or shortness of breath.  She was noted to be tachycardic so she underwent CT angiogram which did not show any pulmonary embolism.  Lower extremity Doppler studies were negative for DVT.  EKG did not show any ischemic changes.   Acute kidney injury/hyperkalemia Baseline creatinine less than 1.0.  Presented with creatinine of 1.57.  BUN was also noted to be elevated.  Improved with IV hydration.  History of COPD Stable.  Hyperlipidemia Continue statin.  GERD Protonix  Patient stable.  Extensive work-up in the hospital revealed vitamin B12 deficiency.  Rest of the work-up can be pursued in the outpatient setting.  Okay for discharge home today.   PERTINENT LABS:  The results of significant  diagnostics from this hospitalization (including imaging, microbiology, ancillary and laboratory) are listed below for reference.    Microbiology: Recent Results (from the past  240 hour(s))  Resp Panel by RT-PCR (Flu A&B, Covid) Nasopharyngeal Swab     Status: None   Collection Time: 09/07/20  9:45 AM   Specimen: Nasopharyngeal Swab; Nasopharyngeal(NP) swabs in vial transport medium  Result Value Ref Range Status   SARS Coronavirus 2 by RT PCR NEGATIVE NEGATIVE Final    Comment: (NOTE) SARS-CoV-2 target nucleic acids are NOT DETECTED.  The SARS-CoV-2 RNA is generally detectable in upper respiratory specimens during the acute phase of infection. The lowest concentration of SARS-CoV-2 viral copies this assay can detect is 138 copies/mL. A negative result does not preclude SARS-Cov-2 infection and should not be used as the sole basis for treatment or other patient management decisions. A negative result may occur with  improper specimen collection/handling, submission of specimen other than nasopharyngeal swab, presence of viral mutation(s) within the areas targeted by this assay, and inadequate number of viral copies(<138 copies/mL). A negative result must be combined with clinical observations, patient history, and epidemiological information. The expected result is Negative.  Fact Sheet for Patients:  EntrepreneurPulse.com.au  Fact Sheet for Healthcare Providers:  IncredibleEmployment.be  This test is no t yet approved or cleared by the [place] FDA and  has been authorized for detection and/or diagnosis of SARS-CoV-2 by FDA under an Emergency Use Authorization (EUA). This EUA will remain  in effect (meaning this test can be used) for the duration of the COVID-19 declaration under Section 564(b)(1) of the Act, 21 U.S.C.section 360bbb-3(b)(1), unless the authorization is terminated  or revoked sooner.       Influenza A by PCR NEGATIVE NEGATIVE Final   Influenza B by PCR NEGATIVE NEGATIVE Final    Comment: (NOTE) The Xpert Xpress SARS-CoV-2/FLU/RSV plus assay is intended as an aid in the diagnosis of influenza  from Nasopharyngeal swab specimens and should not be used as a sole basis for treatment. Nasal washings and aspirates are unacceptable for Xpert Xpress SARS-CoV-2/FLU/RSV testing.  Fact Sheet for Patients: EntrepreneurPulse.com.au  Fact Sheet for Healthcare Providers: IncredibleEmployment.be  This test is not yet approved or cleared by the [place] FDA and has been authorized for detection and/or diagnosis of SARS-CoV-2 by FDA under an Emergency Use Authorization (EUA). This EUA will remain in effect (meaning this test can be used) for the duration of the COVID-19 declaration under Section 564(b)(1) of the Act, 21 U.S.C. section 360bbb-3(b)(1), unless the authorization is terminated or revoked.  Performed at [facility], [address] [place], [place] [address]      Labs:  [place]    09/07/20 1435 09/08/20 0452  DDIMER 2.32*  --   FERRITIN  --  43    Lab Results  Component Value Date   SARSCOV2NAA NEGATIVE 09/07/2020   [place] NEGATIVE 12/15/2019      Basic Metabolic Panel: Recent Labs  Lab 09/06/20 2000 09/07/20 0945 09/08/20 0452 09/09/20 0515  NA 136 134* 137 138  K 3.5 3.5 3.3* 3.8  CL 98 97* 102 102  CO2 26 26 24 26   GLUCOSE 135* 111* 72 98  BUN 28* 24* 19 24*  CREATININE 1.57* 1.37* 1.40* 1.41*  CALCIUM 9.5 8.8* 8.6* 8.8*   Liver Function Tests: Recent Labs  Lab 09/06/20 2000  AST 23  ALT 14  ALKPHOS 93  BILITOT 0.5  PROT 7.9  ALBUMIN 4.1  CBC: Recent Labs  Lab 09/06/20 2000 09/07/20 0945 09/08/20 0452 09/09/20 0515  WBC 6.4 8.1 6.5 7.2  NEUTROABS 4.2 6.2  --   --   HGB 14.1 12.6 11.6* 11.7*  HCT 43.1 38.1 35.5* 35.2*  MCV 90.4 88.4 89.4 88.7  PLT 253 249 229 249    CBG: Recent Labs  Lab 09/08/20 0735 09/09/20 0751  GLUCAP 76 100*     IMAGING STUDIES CT Head Wo Contrast  Result Date: 09/06/2020 CLINICAL DATA:  Mental status change,  unknown cause altered and confused. nonfocal. hx lung cancer. eval signs of cva, mets, ich EXAM: CT HEAD WITHOUT CONTRAST TECHNIQUE: Contiguous axial images were obtained from the base of the skull through the vertex without intravenous contrast. COMPARISON:  Head CT 11/19/2017.  Brain MRI 02/12/2018 FINDINGS: Brain: Mild motion artifact limitations. Stable degree of atrophy and chronic small vessel ischemia. No evidence of mass or mass effect on this noncontrast motion limited exam. No hemorrhage or subdural collection. No evidence of acute ischemia. No hydrocephalus. Vascular: Atherosclerosis of skullbase vasculature without hyperdense vessel or abnormal calcification. Skull: No fracture or focal lesion. Sinuses/Orbits: Mild mucosal thickening and debris involving posterior right ethmoid air cell. Paranasal sinuses otherwise clear. Hypoplastic left mastoid air cells. No mastoid effusion. No acute orbital findings. Other: None. IMPRESSION: 1. No acute intracranial abnormality. Mild motion artifact limitations. 2. Stable atrophy and chronic small vessel ischemia. Electronically Signed   By: Keith Rake M.D.   On: 09/06/2020 20:57   CT ANGIO CHEST PE W OR WO CONTRAST  Result Date: 09/07/2020 CLINICAL DATA:  Chest pain and shortness of breath. Prior history of lung cancer. EXAM: CT ANGIOGRAPHY CHEST WITH CONTRAST TECHNIQUE: Multidetector CT imaging of the chest was performed using the standard protocol during bolus administration of intravenous contrast. Multiplanar CT image reconstructions and MIPs were obtained to evaluate the vascular anatomy. CONTRAST:  63mL OMNIPAQUE IOHEXOL 350 MG/ML SOLN COMPARISON:  05/04/2020 FINDINGS: Cardiovascular: The heart is normal in size. No pericardial effusion. The aorta is normal in caliber. No dissection. Moderate atherosclerotic calcifications. Stable coronary artery calcifications. The pulmonary arterial tree is fairly well opacified. No filling defects are identified  to suggest pulmonary embolism. Mediastinum/Nodes: Stable extensive radiation changes involving the right paramediastinal lung. No enlarged mediastinal or hilar lymph nodes. The esophagus is grossly normal. Lungs/Pleura: Stable extensive radiation changes involving the right upper lobe without findings suspicious for recurrent tumor. Stable biapical pleural and parenchymal scarring. No acute pulmonary process is identified. No findings suspicious for pulmonary metastatic disease. Upper Abdomen: Stable aortic calcifications and right renal artery stent. No upper abdominal metastatic disease is identified. Musculoskeletal: Stable surgical changes from a left mastectomy. Left-sided Port-A-Cath is noted. The bony thorax is intact. Remote vertebral augmentation changes in the midthoracic spine. No acute bony abnormalities or worrisome bone lesions. Review of the MIP images confirms the above findings. IMPRESSION: 1. No CT findings for pulmonary embolism. 2. Stable extensive radiation changes involving the right upper lobe without findings suspicious for recurrent tumor or metastatic disease. 3. Stable biapical pleural and parenchymal scarring. 4. No acute pulmonary findings. 5. Stable surgical changes from a left mastectomy. 6. Aortic atherosclerosis. Aortic Atherosclerosis (ICD10-I70.0). Aortic Atherosclerosis (ICD10-I70.0). Electronically Signed   By: Marijo Sanes M.D.   On: 09/07/2020 18:31   MR BRAIN WO CONTRAST  Result Date: 09/08/2020 CLINICAL DATA:  Mental status change, unknown cause. Additional history provided: Patient with history of SCLS (status post surgery, chemotherapy and brain radiation therapy), hyperlipidemia, COPD, GERD,  tobacco abuse, breast cancer (status post bilateral mastectomy, radiation and chemotherapy), AAA, anemia, PAD. EXAM: MRI HEAD WITHOUT CONTRAST TECHNIQUE: Multiplanar, multiecho pulse sequences of the brain and surrounding structures were obtained without intravenous contrast.  COMPARISON:  Head CT 09/06/2020.  Brain MRI 02/12/2018. FINDINGS: Brain: The patient was unable to tolerate the full examination due to altered mental status. The examination was prematurely terminated due to severe motion degradation. Only axial diffusion-weighted imaging, coronal diffusion-weighted imaging, an axial T2/FLAIR sequence and an axial T2* sequence could be obtained. The acquired axial T2/FLAIR sequence is severely motion degraded. The axial T2* sequence is moderately motion degraded. Mild cerebral atrophy. No evidence of acute infarction. Severe patchy and confluent T2/FLAIR hyperintensity within the cerebral white matter, nonspecific but likely reflecting a combination of post treatment changes and chronic small vessel ischemic disease. Chronic small vessel ischemic changes within the brainstem. No intracranial mass, chronic intracranial blood products or extra-axial fluid collection is identified. No midline shift. Vascular: Poorly assessed on the acquired sequences. Skull and upper cervical spine: No focal calvarial marrow lesion is identified on the acquired sequences. Sinuses/Orbits: Orbits and paranasal sinuses poorly assessed on the acquired sequences. IMPRESSION: Prematurely terminated, significantly motion degraded and limited examination as described. Postcontrast imaging could not be obtained and this examination is inadequate to exclude intracranial metastatic disease. The diffusion-weighted imaging is of good quality. No evidence of acute infarction. Within described limitations, no acute intracranial abnormality is identified. Severe cerebral white matter signal abnormality, nonspecific but likely reflecting a combination of post-treatment changes and chronic small vessel ischemic disease. Chronic small vessel ischemic disease is also present within the pons. Electronically Signed   By: Kellie Simmering DO   On: 09/08/2020 07:58   US Venous Img Lower Bilateral (DVT)  Result Date:  09/07/2020 CLINICAL DATA:  Positive D-dimer. EXAM: BILATERAL LOWER EXTREMITY VENOUS DOPPLER ULTRASOUND TECHNIQUE: Gray-scale sonography with graded compression, as well as color Doppler and duplex ultrasound were performed to evaluate the lower extremity deep venous systems from the level of the common femoral vein and including the common femoral, femoral, profunda femoral, popliteal and calf veins including the posterior tibial, peroneal and gastrocnemius veins when visible. The superficial great saphenous vein was also interrogated. Spectral Doppler was utilized to evaluate flow at rest and with distal augmentation maneuvers in the common femoral, femoral and popliteal veins. COMPARISON:  None. FINDINGS: RIGHT LOWER EXTREMITY Common Femoral Vein: No evidence of thrombus. Normal compressibility, respiratory phasicity and response to augmentation. Saphenofemoral Junction: No evidence of thrombus. Normal compressibility and flow on color Doppler imaging. Profunda Femoral Vein: No evidence of thrombus. Normal compressibility and flow on color Doppler imaging. Femoral Vein: No evidence of thrombus. Normal compressibility, respiratory phasicity and response to augmentation. Popliteal Vein: No evidence of thrombus. Normal compressibility, respiratory phasicity and response to augmentation. Calf Veins: No evidence of thrombus. Normal compressibility and flow on color Doppler imaging. Superficial Great Saphenous Vein: No evidence of thrombus. Normal compressibility. Venous Reflux:  None. LEFT LOWER EXTREMITY Common Femoral Vein: No evidence of thrombus. Normal compressibility, respiratory phasicity and response to augmentation. Saphenofemoral Junction: No evidence of thrombus. Normal compressibility and flow on color Doppler imaging. Profunda Femoral Vein: No evidence of thrombus. Normal compressibility and flow on color Doppler imaging. Femoral Vein: No evidence of thrombus. Normal compressibility, respiratory phasicity  and response to augmentation. Popliteal Vein: No evidence of thrombus. Normal compressibility, respiratory phasicity and response to augmentation. Calf Veins: No evidence of thrombus. Normal compressibility and flow on color Doppler imaging.  Superficial Great Saphenous Vein: No evidence of thrombus. Normal compressibility. Venous Reflux:  None. IMPRESSION: No evidence of deep venous thrombosis in either lower extremity. Electronically Signed   By: Margaretha Sheffield MD   On: 09/07/2020 17:12   DG Chest Portable 1 View  Result Date: 09/06/2020 CLINICAL DATA:  Altered mental status. EXAM: PORTABLE CHEST 1 VIEW COMPARISON:  February 12, 2020 FINDINGS: There is stable left-sided venous Port-A-Cath positioning. The lungs are hyperinflated. Stable right upper lobe and right suprahilar scarring and postoperative changes are seen. Stable right apical pleural thickening is also noted. There is no evidence of a pleural effusion or pneumothorax. The heart size and mediastinal contours are within normal limits. Radiopaque surgical clips are seen overlying the lateral aspect of the mid left chest wall. Prior vertebroplasty is noted at the level of T6 with degenerative changes seen throughout the remainder of the thoracic spine. IMPRESSION: 1. Stable right upper lobe and right suprahilar scarring and postoperative changes without acute cardiopulmonary disease. Electronically Signed   By: Virgina Norfolk M.D.   On: 09/06/2020 20:25    DISCHARGE EXAMINATION: Vitals:   09/09/20 0416 09/09/20 0743 09/09/20 0834 09/09/20 1400  BP: (!) 159/90  (!) 146/76   Pulse: 95  93   Resp: 16  18   Temp: 98.7 F (37.1 C)  97.8 F (36.6 C)   TempSrc:   Oral   SpO2: 96% 96% 92% 92%  Weight:      Height:       General appearance: Awake alert.  In no distress Resp: Clear to auscultation bilaterally.  Normal effort Cardio: S1-S2 is normal regular.  No S3-S4.  No rubs murmurs or bruit GI: Abdomen is soft.  Nontender  nondistended.  Bowel sounds are present normal.  No masses organomegaly    DISPOSITION: Home with daughter  Discharge Instructions    Call MD for:  difficulty breathing, headache or visual disturbances   Complete by: As directed    Call MD for:  extreme fatigue   Complete by: As directed    Call MD for:  persistant dizziness or light-headedness   Complete by: As directed    Call MD for:  persistant nausea and vomiting   Complete by: As directed    Call MD for:  severe uncontrolled pain   Complete by: As directed    Call MD for:  temperature >100.4   Complete by: As directed    Diet - low sodium heart healthy   Complete by: As directed    Discharge instructions   Complete by: As directed    Please talk to your primary care provider about referring you to a neurologist.  Take your B12 medication.  Your B12 level will need to be rechecked in 3 weeks. Your doctor should also check your thyroid function tests follow-up. Your blood pressure was noted to be slightly elevated here in the hospital.  This will also need to be addressed by your PCP.  You were cared for by a hospitalist during your hospital stay. If you have any questions about your discharge medications or the care you received while you were in the hospital after you are discharged, you can call the unit and asked to speak with the hospitalist on call if the hospitalist that took care of you is not available. Once you are discharged, your primary care physician will handle any further medical issues. Please note that NO REFILLS for any discharge medications will be authorized once you are discharged,  as it is imperative that you return to your primary care physician (or establish a relationship with a primary care physician if you do not have one) for your aftercare needs so that they can reassess your need for medications and monitor your lab values. If you do not have a primary care physician, you can call [phone] for a physician  referral.   Increase activity slowly   Complete by: As directed         Allergies as of 09/09/2020   No Known Allergies     Medication List    STOP taking these medications   HYDROcodone-acetaminophen 5-325 MG tablet Commonly known as: Norco   lidocaine-prilocaine cream Commonly known as: EMLA     TAKE these medications   acetaminophen 325 MG tablet Commonly known as: TYLENOL Take 650 mg by mouth every 6 (six) hours as needed for mild pain.   albuterol 108 (90 Base) MCG/ACT inhaler Commonly known as: VENTOLIN HFA Inhale 2 puffs into the lungs every 6 (six) hours as needed for wheezing or shortness of breath.   aspirin 81 MG EC tablet Take 1 tablet (81 mg total) by mouth daily. Swallow whole.   atorvastatin 10 MG tablet Commonly known as: LIPITOR Take 10 mg by mouth at bedtime.   fluticasone-salmeterol 115-21 MCG/ACT inhaler Commonly known as: ADVAIR HFA Inhale 2 puffs into the lungs 2 (two) times daily.   omeprazole 20 MG capsule Commonly known as: PRILOSEC TAKE ONE CAPSULE BY MOUTH ONCE DAILY   vitamin B-12 1000 MCG tablet Commonly known as: CYANOCOBALAMIN Take 1 tablet (1,000 mcg total) by mouth daily.         Follow-up Information    Gayland Curry, MD. Go on 09/16/2020.   Specialty: Family Medicine Why: 11:00pm Contact information: [place] [address] [phone]               TOTAL DISCHARGE TIME: 35 minutes  [facility] Pager on [email or web address]  09/10/2020, 12:25 PM

## 2020-09-09 NOTE — Progress Notes (Signed)
Patient has been restless throughout the night. She has made several attempts to get out of the bed. Redirection and medication has been unsuccessful. Received a Air cabin crew order from Dr. Sidney Ace.

## 2020-09-09 NOTE — Progress Notes (Signed)
Discharge instructions given to patient and family at bedside. Verbalized understanding. No acute distress at this time. Awaiting IV team to deaccess port. Family at bedside and will transport home.

## 2020-09-14 ENCOUNTER — Telehealth: Payer: Self-pay | Admitting: Hematology and Oncology

## 2020-09-14 NOTE — Telephone Encounter (Signed)
thiswpatient called & left vm to cancel port flush appt due to it being done in the hospital  Sent to stephanie (scheduler)  To remove appt

## 2020-09-22 ENCOUNTER — Inpatient Hospital Stay: Payer: Medicare HMO

## 2020-09-27 ENCOUNTER — Other Ambulatory Visit: Payer: Self-pay

## 2020-09-27 DIAGNOSIS — K2100 Gastro-esophageal reflux disease with esophagitis, without bleeding: Secondary | ICD-10-CM

## 2020-09-27 MED ORDER — OMEPRAZOLE 20 MG PO CPDR: 1.0000 | DELAYED_RELEASE_CAPSULE | Freq: Every day | ORAL | 1 refills | Status: DC

## 2020-10-12 ENCOUNTER — Inpatient Hospital Stay: Payer: Medicare HMO

## 2020-10-12 ENCOUNTER — Other Ambulatory Visit: Payer: Self-pay

## 2020-10-12 ENCOUNTER — Encounter: Payer: Self-pay | Admitting: Emergency Medicine

## 2020-10-12 ENCOUNTER — Inpatient Hospital Stay
Admission: EM | Admit: 2020-10-12 | Discharge: 2020-10-17 | DRG: 291 | Disposition: A | Discharge status: Home Health | Payer: Medicare HMO | Attending: Internal Medicine | Admitting: Internal Medicine

## 2020-10-12 ENCOUNTER — Emergency Department: Payer: Medicare HMO

## 2020-10-12 DIAGNOSIS — Z853 Personal history of malignant neoplasm of breast: Secondary | ICD-10-CM

## 2020-10-12 DIAGNOSIS — Z8249 Family history of ischemic heart disease and other diseases of the circulatory system: Secondary | ICD-10-CM | POA: Diagnosis not present

## 2020-10-12 DIAGNOSIS — J441 Chronic obstructive pulmonary disease with (acute) exacerbation: Secondary | ICD-10-CM | POA: Diagnosis present

## 2020-10-12 DIAGNOSIS — H9190 Unspecified hearing loss, unspecified ear: Secondary | ICD-10-CM | POA: Diagnosis present

## 2020-10-12 DIAGNOSIS — I739 Peripheral vascular disease, unspecified: Secondary | ICD-10-CM | POA: Diagnosis present

## 2020-10-12 DIAGNOSIS — K219 Gastro-esophageal reflux disease without esophagitis: Secondary | ICD-10-CM | POA: Diagnosis present

## 2020-10-12 DIAGNOSIS — I700 Atherosclerosis of aorta: Secondary | ICD-10-CM | POA: Diagnosis present

## 2020-10-12 DIAGNOSIS — Z800 Family history of malignant neoplasm of digestive organs: Secondary | ICD-10-CM

## 2020-10-12 DIAGNOSIS — Z9221 Personal history of antineoplastic chemotherapy: Secondary | ICD-10-CM | POA: Diagnosis not present

## 2020-10-12 DIAGNOSIS — E785 Hyperlipidemia, unspecified: Secondary | ICD-10-CM | POA: Diagnosis present

## 2020-10-12 DIAGNOSIS — E7800 Pure hypercholesterolemia, unspecified: Secondary | ICD-10-CM | POA: Diagnosis present

## 2020-10-12 DIAGNOSIS — F17210 Nicotine dependence, cigarettes, uncomplicated: Secondary | ICD-10-CM | POA: Diagnosis present

## 2020-10-12 DIAGNOSIS — E871 Hypo-osmolality and hyponatremia: Secondary | ICD-10-CM | POA: Diagnosis present

## 2020-10-12 DIAGNOSIS — Z90710 Acquired absence of both cervix and uterus: Secondary | ICD-10-CM | POA: Diagnosis not present

## 2020-10-12 DIAGNOSIS — R0602 Shortness of breath: Secondary | ICD-10-CM

## 2020-10-12 DIAGNOSIS — Z7982 Long term (current) use of aspirin: Secondary | ICD-10-CM | POA: Diagnosis not present

## 2020-10-12 DIAGNOSIS — Z8679 Personal history of other diseases of the circulatory system: Secondary | ICD-10-CM

## 2020-10-12 DIAGNOSIS — I080 Rheumatic disorders of both mitral and aortic valves: Secondary | ICD-10-CM | POA: Diagnosis present

## 2020-10-12 DIAGNOSIS — J90 Pleural effusion, not elsewhere classified: Secondary | ICD-10-CM | POA: Diagnosis not present

## 2020-10-12 DIAGNOSIS — K5900 Constipation, unspecified: Secondary | ICD-10-CM

## 2020-10-12 DIAGNOSIS — Z7951 Long term (current) use of inhaled steroids: Secondary | ICD-10-CM

## 2020-10-12 DIAGNOSIS — Z79899 Other long term (current) drug therapy: Secondary | ICD-10-CM | POA: Diagnosis not present

## 2020-10-12 DIAGNOSIS — I5041 Acute combined systolic (congestive) and diastolic (congestive) heart failure: Secondary | ICD-10-CM | POA: Diagnosis not present

## 2020-10-12 DIAGNOSIS — I429 Cardiomyopathy, unspecified: Secondary | ICD-10-CM | POA: Diagnosis present

## 2020-10-12 DIAGNOSIS — I509 Heart failure, unspecified: Secondary | ICD-10-CM | POA: Diagnosis not present

## 2020-10-12 DIAGNOSIS — I248 Other forms of acute ischemic heart disease: Secondary | ICD-10-CM | POA: Diagnosis not present

## 2020-10-12 DIAGNOSIS — Z20822 Contact with and (suspected) exposure to covid-19: Secondary | ICD-10-CM | POA: Diagnosis present

## 2020-10-12 DIAGNOSIS — Z85118 Personal history of other malignant neoplasm of bronchus and lung: Secondary | ICD-10-CM | POA: Diagnosis not present

## 2020-10-12 DIAGNOSIS — N179 Acute kidney failure, unspecified: Secondary | ICD-10-CM | POA: Diagnosis not present

## 2020-10-12 DIAGNOSIS — I255 Ischemic cardiomyopathy: Secondary | ICD-10-CM | POA: Diagnosis not present

## 2020-10-12 DIAGNOSIS — I110 Hypertensive heart disease with heart failure: Secondary | ICD-10-CM | POA: Diagnosis not present

## 2020-10-12 DIAGNOSIS — I493 Ventricular premature depolarization: Secondary | ICD-10-CM | POA: Diagnosis present

## 2020-10-12 DIAGNOSIS — R000 Tachycardia, unspecified: Secondary | ICD-10-CM | POA: Diagnosis present

## 2020-10-12 DIAGNOSIS — I2510 Atherosclerotic heart disease of native coronary artery without angina pectoris: Secondary | ICD-10-CM | POA: Diagnosis present

## 2020-10-12 DIAGNOSIS — I5043 Acute on chronic combined systolic (congestive) and diastolic (congestive) heart failure: Secondary | ICD-10-CM | POA: Diagnosis present

## 2020-10-12 DIAGNOSIS — J9601 Acute respiratory failure with hypoxia: Secondary | ICD-10-CM | POA: Diagnosis present

## 2020-10-12 DIAGNOSIS — Z9013 Acquired absence of bilateral breasts and nipples: Secondary | ICD-10-CM

## 2020-10-12 DIAGNOSIS — E538 Deficiency of other specified B group vitamins: Secondary | ICD-10-CM | POA: Diagnosis present

## 2020-10-12 DIAGNOSIS — R0603 Acute respiratory distress: Secondary | ICD-10-CM | POA: Diagnosis not present

## 2020-10-12 LAB — CBC
HCT: 33.4 % — ABNORMAL LOW (ref 36.0–46.0)
Hemoglobin: 10.9 g/dL — ABNORMAL LOW (ref 12.0–15.0)
MCH: 29.5 pg (ref 26.0–34.0)
MCHC: 32.6 g/dL (ref 30.0–36.0)
MCV: 90.5 fL (ref 80.0–100.0)
Platelets: 441 10*3/uL — ABNORMAL HIGH (ref 150–400)
RBC: 3.69 MIL/uL — ABNORMAL LOW (ref 3.87–5.11)
RDW: 15.2 % (ref 11.5–15.5)
WBC: 11.3 10*3/uL — ABNORMAL HIGH (ref 4.0–10.5)
nRBC: 0 % (ref 0.0–0.2)

## 2020-10-12 LAB — COMPREHENSIVE METABOLIC PANEL
ALT: 17 U/L (ref 0–44)
AST: 24 U/L (ref 15–41)
Albumin: 3.2 g/dL — ABNORMAL LOW (ref 3.5–5.0)
Alkaline Phosphatase: 107 U/L (ref 38–126)
Anion gap: 11 (ref 5–15)
BUN: 16 mg/dL (ref 8–23)
CO2: 25 mmol/L (ref 22–32)
Calcium: 8.6 mg/dL — ABNORMAL LOW (ref 8.9–10.3)
Chloride: 89 mmol/L — ABNORMAL LOW (ref 98–111)
Creatinine, Ser: 0.82 mg/dL (ref 0.44–1.00)
GFR, Estimated: >60 mL/min (ref >60)
Glucose, Bld: 113 mg/dL — ABNORMAL HIGH (ref 70–99)
Potassium: 4.2 mmol/L (ref 3.5–5.1)
Sodium: 125 mmol/L — ABNORMAL LOW (ref 135–145)
Total Bilirubin: 0.8 mg/dL (ref 0.3–1.2)
Total Protein: 6.6 g/dL (ref 6.5–8.1)

## 2020-10-12 LAB — D-DIMER, QUANTITATIVE: D-Dimer, Quant: 1 ug/mL-FEU — ABNORMAL HIGH (ref 0.00–0.50)

## 2020-10-12 LAB — RESP PANEL BY RT-PCR (FLU A&B, COVID) ARPGX2
Influenza A by PCR: NEGATIVE
Influenza B by PCR: NEGATIVE
SARS Coronavirus 2 by RT PCR: NEGATIVE

## 2020-10-12 LAB — TROPONIN I (HIGH SENSITIVITY)
Troponin I (High Sensitivity): 52 ng/L — ABNORMAL HIGH (ref <18)
Troponin I (High Sensitivity): 52 ng/L — ABNORMAL HIGH (ref <18)

## 2020-10-12 LAB — HIV ANTIBODY (ROUTINE TESTING W REFLEX): HIV Screen 4th Generation wRfx: NONREACTIVE

## 2020-10-12 LAB — SODIUM: Sodium: 127 mmol/L — ABNORMAL LOW (ref 135–145)

## 2020-10-12 LAB — NA AND K (SODIUM & POTASSIUM), RAND UR
Potassium Urine: 20 mmol/L
Sodium, Ur: 101 mmol/L

## 2020-10-12 LAB — BRAIN NATRIURETIC PEPTIDE: B Natriuretic Peptide: 1331.8 pg/mL — ABNORMAL HIGH (ref 0.0–100.0)

## 2020-10-12 MED ORDER — SODIUM CHLORIDE 0.9 % IV SOLN: 250.0000 mL | INTRAVENOUS | Status: DC | PRN

## 2020-10-12 MED ORDER — PANTOPRAZOLE SODIUM 40 MG PO TBEC
40.0000 mg | DELAYED_RELEASE_TABLET | Freq: Every day | ORAL | Status: DC
  Administered 2020-10-12 – 2020-10-17 (×6): 40 mg via ORAL
  Filled 2020-10-12 (×6): qty 1

## 2020-10-12 MED ORDER — METHYLPREDNISOLONE SODIUM SUCC 125 MG IJ SOLR
125.0000 mg | Freq: Once | INTRAMUSCULAR | Status: AC
  Administered 2020-10-12: 125 mg via INTRAVENOUS
  Filled 2020-10-12: qty 2

## 2020-10-12 MED ORDER — IPRATROPIUM-ALBUTEROL 0.5-2.5 (3) MG/3ML IN SOLN
3.0000 mL | Freq: Once | RESPIRATORY_TRACT | Status: AC
  Administered 2020-10-12: 3 mL via RESPIRATORY_TRACT

## 2020-10-12 MED ORDER — FUROSEMIDE 10 MG/ML IJ SOLN
40.0000 mg | Freq: Once | INTRAMUSCULAR | Status: AC
  Administered 2020-10-12: 40 mg via INTRAVENOUS
  Filled 2020-10-12: qty 4

## 2020-10-12 MED ORDER — ZOLPIDEM TARTRATE 5 MG PO TABS
5.0000 mg | ORAL_TABLET | Freq: Every evening | ORAL | Status: DC | PRN
  Administered 2020-10-12 – 2020-10-16 (×5): 5 mg via ORAL
  Filled 2020-10-12 (×5): qty 1

## 2020-10-12 MED ORDER — FUROSEMIDE 10 MG/ML IJ SOLN
40.0000 mg | Freq: Every day | INTRAMUSCULAR | Status: DC
  Administered 2020-10-13: 40 mg via INTRAVENOUS
  Filled 2020-10-12: qty 4

## 2020-10-12 MED ORDER — ONDANSETRON HCL 4 MG/2ML IJ SOLN: 4.0000 mg | Freq: Four times a day (QID) | INTRAMUSCULAR | Status: DC | PRN

## 2020-10-12 MED ORDER — IOHEXOL 350 MG/ML SOLN
75.0000 mL | Freq: Once | INTRAVENOUS | Status: AC | PRN
  Administered 2020-10-12: 75 mL via INTRAVENOUS

## 2020-10-12 MED ORDER — MOMETASONE FURO-FORMOTEROL FUM 200-5 MCG/ACT IN AERO
2.0000 | INHALATION_SPRAY | Freq: Two times a day (BID) | RESPIRATORY_TRACT | Status: DC
  Administered 2020-10-12 – 2020-10-13 (×2): 2 via RESPIRATORY_TRACT
  Filled 2020-10-12 (×2): qty 8.8

## 2020-10-12 MED ORDER — SODIUM CHLORIDE 0.9% FLUSH: 3.0000 mL | INTRAVENOUS | Status: DC | PRN

## 2020-10-12 MED ORDER — ACETAMINOPHEN 325 MG PO TABS
650.0000 mg | ORAL_TABLET | Freq: Four times a day (QID) | ORAL | Status: DC | PRN
  Administered 2020-10-12 – 2020-10-17 (×10): 650 mg via ORAL
  Filled 2020-10-12 (×10): qty 2

## 2020-10-12 MED ORDER — PREDNISONE 20 MG PO TABS
40.0000 mg | ORAL_TABLET | Freq: Every day | ORAL | Status: DC
  Administered 2020-10-12: 40 mg via ORAL
  Filled 2020-10-12: qty 2

## 2020-10-12 MED ORDER — ASPIRIN EC 81 MG PO TBEC
81.0000 mg | DELAYED_RELEASE_TABLET | Freq: Every day | ORAL | Status: DC
  Administered 2020-10-12 – 2020-10-17 (×6): 81 mg via ORAL
  Filled 2020-10-12 (×6): qty 1

## 2020-10-12 MED ORDER — NICOTINE 14 MG/24HR TD PT24
14.0000 mg | MEDICATED_PATCH | Freq: Every day | TRANSDERMAL | Status: DC
  Administered 2020-10-13 – 2020-10-17 (×5): 14 mg via TRANSDERMAL
  Filled 2020-10-12 (×6): qty 1

## 2020-10-12 MED ORDER — AZITHROMYCIN 250 MG PO TABS
500.0000 mg | ORAL_TABLET | Freq: Every day | ORAL | Status: AC
  Administered 2020-10-13 – 2020-10-16 (×4): 500 mg via ORAL
  Filled 2020-10-12 (×4): qty 2

## 2020-10-12 MED ORDER — ATORVASTATIN CALCIUM 10 MG PO TABS
10.0000 mg | ORAL_TABLET | Freq: Every day | ORAL | Status: DC
  Administered 2020-10-12 – 2020-10-16 (×5): 10 mg via ORAL
  Filled 2020-10-12 (×5): qty 1

## 2020-10-12 MED ORDER — VITAMIN B-12 1000 MCG PO TABS
1000.0000 ug | ORAL_TABLET | Freq: Every day | ORAL | Status: DC
  Administered 2020-10-12 – 2020-10-17 (×6): 1000 ug via ORAL
  Filled 2020-10-12 (×6): qty 1

## 2020-10-12 MED ORDER — SODIUM CHLORIDE 0.9% FLUSH
3.0000 mL | Freq: Two times a day (BID) | INTRAVENOUS | Status: DC
  Administered 2020-10-12 – 2020-10-17 (×9): 3 mL via INTRAVENOUS

## 2020-10-12 MED ORDER — SODIUM CHLORIDE 0.9 % IV SOLN
500.0000 mg | INTRAVENOUS | Status: AC
  Administered 2020-10-12: 500 mg via INTRAVENOUS
  Filled 2020-10-12: qty 500

## 2020-10-12 MED ORDER — ENOXAPARIN SODIUM 40 MG/0.4ML IJ SOSY
40.0000 mg | PREFILLED_SYRINGE | INTRAMUSCULAR | Status: DC
  Administered 2020-10-12 – 2020-10-16 (×5): 40 mg via SUBCUTANEOUS
  Filled 2020-10-12 (×5): qty 0.4

## 2020-10-12 MED ORDER — ORAL CARE MOUTH RINSE
15.0000 mL | Freq: Two times a day (BID) | OROMUCOSAL | Status: DC
  Administered 2020-10-12 – 2020-10-17 (×10): 15 mL via OROMUCOSAL

## 2020-10-12 MED ORDER — IPRATROPIUM-ALBUTEROL 0.5-2.5 (3) MG/3ML IN SOLN
3.0000 mL | Freq: Once | RESPIRATORY_TRACT | Status: AC
  Administered 2020-10-12: 3 mL via RESPIRATORY_TRACT
  Filled 2020-10-12: qty 6

## 2020-10-12 NOTE — ED Notes (Signed)
EDP made aware that patient noted to be increasingly restless, also noted to be 88-90% on 4L via [place] when previously 93% on 4L and increasingly tachycardic. EDP states will com re-evaluate patient. Medication given per EDP order at this time.

## 2020-10-12 NOTE — ED Triage Notes (Signed)
Pt via EMS from her PCP. Pt c/o SOB and hypoxia. RA sat 84% on RA pt does not wear O2 at home. On arrival, pt 84% on RA. Pt increased to 91% on 5L. Pt is A&Ox4

## 2020-10-12 NOTE — ED Notes (Signed)
No change in patient condition. Pt states is trying to sleep, door closed for patient comfort. Pt denies further needs. Call bell within reach of patient at this time.

## 2020-10-12 NOTE — H&P (Signed)
.   Chief Complaint: Patient presented to the ED on a/c of shortness of breath HPI: Roberta [name] is an 75 y.o. 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  She presented to the emergency room today on account of worsening shortness of breath.  Onset of symptoms was 1 day prior to presentation.  She has been unable to sleep due to orthopnea and paroxysms of dyspnea.  She denied any associated chest pain palpitations nausea or vomiting.  Denied any lower extremity edema.  She denied any prior history of CHF diagnosis.  Work-up in the ED including chest x-ray revealed small right pleural effusion with increased diffuse interstitial infiltrates suspicious for pulmonary edema.  BNP noted to be markedly elevated.  Labs were also significant for hyponatremia.  Past Medical History:  Diagnosis Date  . AAA (abdominal aortic aneurysm) ([place])   . Anemia   . Breast cancer ([place]) 2002   left  . COPD (chronic obstructive pulmonary disease) ([place])   . Dyspnea   . Headache   . High cholesterol   . Personal history of chemotherapy   . Small cell lung cancer ([place])   . Tobacco abuse     Past Surgical History:  Procedure Laterality Date  . ABDOMINAL HYSTERECTOMY    . ARTERY BIOPSY Right 12/05/2017   Procedure: BIOPSY TEMPORAL ARTERY;  Surgeon: Margaretha Sheffield, MD;  Location: [facility];  Service: ENT;  Laterality: Right;  . ENDOBRONCHIAL ULTRASOUND N/A 02/19/2017   Procedure: ENDOBRONCHIAL ULTRASOUND;  Surgeon: Flora Lipps, MD;  Location: [facility];  Service: Cardiopulmonary;  Laterality: N/A;  . ENDOVASCULAR REPAIR/STENT GRAFT N/A 12/06/2016   Procedure: Endovascular Repair/Stent Graft;  Surgeon: Katha Cabal, MD;  Location: [facility];  Service: Cardiovascular;   Laterality: N/A;  . IR FLUORO GUIDE PORT INSERTION RIGHT  03/02/2017  . KYPHOPLASTY N/A 06/11/2018   Procedure: KYPHOPLASTY L4 BIOPSY WITH RFA;  Surgeon: Hessie Knows, MD;  Location: [facility];  Service: Orthopedics;  Laterality: N/A;  . KYPHOPLASTY N/A 12/16/2019   Procedure: T6 KYPHOPLASTY;  Surgeon: Hessie Knows, MD;  Location: [facility];  Service: Orthopedics;  Laterality: N/A;  . MASTECTOMY Left 2002   with sentinel node     Family History  Problem Relation Age of Onset  . CAD Mother   . Colon cancer Brother   . Breast cancer Neg Hx    Social History:  reports that she has been smoking cigarettes. She has a 75.00 pack-year smoking history. She has never used smokeless tobacco. She reports that she does not drink alcohol and does not use drugs.  Allergies: No Known Allergies  (Not in a hospital admission)   Results for orders placed or performed during the hospital encounter of 10/12/20 (from the past 48 hour(s))  CBC     Status: Abnormal   Collection Time: 10/12/20 11:12 AM  Result Value Ref Range   WBC 11.3 (H) 4.0 - 10.5 K/uL   RBC 3.69 (L) 3.87 - 5.11 MIL/uL   Hemoglobin 10.9 (L) 12.0 - 15.0 g/dL   HCT 33.4 (L) 36.0 - 46.0 %   MCV 90.5 80.0 - 100.0 fL   MCH 29.5 26.0 - 34.0 pg   MCHC 32.6 30.0 - 36.0 g/dL   RDW 15.2 11.5 - 15.5 %   Platelets 441 (H) 150 -  400 K/uL   nRBC 0.0 0.0 - 0.2 %    Comment: Performed at [facility], [address] [place], [place] [address]  Comprehensive metabolic panel     Status: Abnormal   Collection Time: 10/12/20 11:12 AM  Result Value Ref Range   Sodium 125 (L) 135 - 145 mmol/L   Potassium 4.2 3.5 - 5.1 mmol/L   Chloride 89 (L) 98 - 111 mmol/L   CO2 25 22 - 32 mmol/L   Glucose, Bld 113 (H) 70 - 99 mg/dL    Comment: Glucose reference range applies only to samples taken after fasting for at least 8 hours.   BUN 16 8 - 23 mg/dL   Creatinine, Ser 0.82 0.44 - 1.00 mg/dL   Calcium 8.6 (L) 8.9 - 10.3 mg/dL   Total Protein 6.6  6.5 - 8.1 g/dL   Albumin 3.2 (L) 3.5 - 5.0 g/dL   AST 24 15 - 41 U/L   ALT 17 0 - 44 U/L   Alkaline Phosphatase 107 38 - 126 U/L   Total Bilirubin 0.8 0.3 - 1.2 mg/dL   GFR, Estimated >60 >60 mL/min    Comment: (NOTE) Calculated using the CKD-EPI Creatinine Equation (2021)    Anion gap 11 5 - 15    Comment: Performed at [facility], [address] [place], [place] [address]  Brain natriuretic peptide     Status: Abnormal   Collection Time: 10/12/20 11:12 AM  Result Value Ref Range   B Natriuretic Peptide 1,331.8 (H) 0.0 - 100.0 pg/mL    Comment: Performed at [facility], [place], [place] [address]  Troponin I (High Sensitivity)     Status: Abnormal   Collection Time: 10/12/20 11:12 AM  Result Value Ref Range   Troponin I (High Sensitivity) 52 (H) <18 ng/L    Comment: (NOTE) Elevated high sensitivity troponin I (hsTnI) values and significant  changes across serial measurements may suggest ACS but many other  chronic and acute conditions are known to elevate hsTnI results.  Refer to the "Links" section for chest pain algorithms and additional  guidance. Performed at [facility], [place], [place] [address]   Resp Panel by RT-PCR (Flu A&B, Covid) Nasopharyngeal Swab     Status: None   Collection Time: 10/12/20 11:21 AM   Specimen: Nasopharyngeal Swab; Nasopharyngeal(NP) swabs in vial transport medium  Result Value Ref Range   SARS Coronavirus 2 by RT PCR NEGATIVE NEGATIVE    Comment: (NOTE) SARS-CoV-2 target nucleic acids are NOT DETECTED.  The SARS-CoV-2 RNA is generally detectable in upper respiratory specimens during the acute phase of infection. The lowest concentration of SARS-CoV-2 viral copies this assay can detect is 138 copies/mL. A negative result does not preclude SARS-Cov-2 infection and should not be used as the sole basis for treatment or other patient management decisions. A negative result may  occur with  improper specimen collection/handling, submission of specimen other than nasopharyngeal swab, presence of viral mutation(s) within the areas targeted by this assay, and inadequate number of viral copies(<138 copies/mL). A negative result must be combined with clinical observations, patient history, and epidemiological information. The expected result is Negative.  Fact Sheet for Patients:  EntrepreneurPulse.com.au  Fact Sheet for Healthcare Providers:  IncredibleEmployment.be  This test is no t yet approved or cleared by the [place] FDA and  has been authorized for detection and/or diagnosis of SARS-CoV-2 by FDA under an Emergency Use Authorization (EUA). This  EUA will remain  in effect (meaning this test can be used) for the duration of the COVID-19 declaration under Section 564(b)(1) of the Act, 21 U.S.C.section 360bbb-3(b)(1), unless the authorization is terminated  or revoked sooner.       Influenza A by PCR NEGATIVE NEGATIVE   Influenza B by PCR NEGATIVE NEGATIVE    Comment: (NOTE) The Xpert Xpress SARS-CoV-2/FLU/RSV plus assay is intended as an aid in the diagnosis of influenza from Nasopharyngeal swab specimens and should not be used as a sole basis for treatment. Nasal washings and aspirates are unacceptable for Xpert Xpress SARS-CoV-2/FLU/RSV testing.  Fact Sheet for Patients: EntrepreneurPulse.com.au  Fact Sheet for Healthcare Providers: IncredibleEmployment.be  This test is not yet approved or cleared by the [place] FDA and has been authorized for detection and/or diagnosis of SARS-CoV-2 by FDA under an Emergency Use Authorization (EUA). This EUA will remain in effect (meaning this test can be used) for the duration of the COVID-19 declaration under Section 564(b)(1) of the Act, 21 U.S.C. section 360bbb-3(b)(1), unless the authorization is terminated  or revoked.  Performed at [facility], [address] [place], [place] [address]    DG Chest Portable 1 View  Result Date: 10/12/2020 CLINICAL DATA:  Shortness of breath. Previous history of lung carcinoma and radiation therapy. EXAM: PORTABLE CHEST 1 VIEW COMPARISON:  09/06/2020 FINDINGS: Heart size and mediastinal contours are stable. Extensive pleural-parenchymal scarring and volume loss again seen in the right upper lobe. A new small right pleural effusion is seen, with atelectasis or infiltrate at the right lung base. Increased diffuse interstitial infiltrates are also seen compared to previous study with Kerley B-lines, suspicious for diffuse interstitial edema. Left-sided Port-A-Cath remains in place. IMPRESSION: New small right pleural effusion, and right basilar atelectasis versus infiltrate. Increased diffuse interstitial infiltrates, suspicious for pulmonary interstitial edema. Stable post radiation changes in right upper hemithorax. Electronically Signed   By: Marlaine Hind M.D.   On: 10/12/2020 11:54    Review of Systems  Constitutional: Positive for activity change and appetite change. Negative for chills.  HENT: Negative.   Eyes: Negative.   Respiratory: Positive for chest tightness, shortness of breath and wheezing.   Cardiovascular: Positive for palpitations. Negative for chest pain and leg swelling.  Gastrointestinal: Negative.   Endocrine: Negative.   Genitourinary: Negative.   Musculoskeletal: Positive for back pain.  Allergic/Immunologic: Negative.   Neurological: Negative.   Hematological: Negative.   Psychiatric/Behavioral: Negative.     Blood pressure (!) 174/89, pulse (!) 129, temperature 98.9 F (37.2 C), temperature source Oral, resp. rate (!) 27, height 5\' 7"  (1.702 m), weight 70 kg, SpO2 92 %. Physical Exam Vitals reviewed.  Constitutional:      Appearance: She is ill-appearing.     Comments: Hard of hearing  HENT:     Head: Atraumatic.      Mouth/Throat:     Mouth: Mucous membranes are moist.  Cardiovascular:     Rate and Rhythm: Normal rate and regular rhythm.  Pulmonary:     Effort: Tachypnea present.     Breath sounds: Examination of the right-upper field reveals decreased breath sounds. Examination of the left-upper field reveals decreased breath sounds. Examination of the right-middle field reveals decreased breath sounds and wheezing. Examination of the left-middle field reveals decreased breath sounds and wheezing. Examination of the right-lower field reveals decreased breath sounds. Examination of the left-lower field reveals decreased breath sounds. Decreased breath sounds and wheezing present.     Comments: Diminished  bilateral breath sounds Chest:     Comments: Port A catheter insitu Musculoskeletal:     Cervical back: Normal range of motion.  Neurological:     General: No focal deficit present.     Mental Status: She is alert and oriented to person, place, and time.      Assessment/Plan 75 year old female with history of small cell lung cancer, COPD presenting with acute shortness of breath.  X-ray findings suggesting pulmonary vascular congestion.  Acute dyspnea most likely secondary to COPD exacerbation and possible congestive heart failure.  Chest x-ray findings suggestive of pulmonary interstitial edema.  Patient will be optimized with IV Lasix.  Echocardiogram will be requested to evaluate for left ventricular ejection fraction.  Daily weight monitoring.  Fluid restriction also advised.  COPD with acute exacerbation: Patient has longstanding COPD.  Still continues to smoke cigarettes.  Patient will be optimized with p.o. prednisone and DuoNebs bronchodilators.  IV azithromycin will be added to patient's regimen.  Hyponatremia: Likely hypervolemic hyponatremia from fluid overload.  Other differentials include SIADH.  Will restrict fluid at this time and optimize fluid overload with Lasix.  We will continue to  monitor serum sodium.  Patient is asymptomatic at this time for hyponatremia.  Nephrology input may be warranted if no improvement with serum sodium levels.  History of small cell lung cancer in the right upper lobe She is status post surgery chemotherapy and XRT. Currently not on chemotherapy.  Mildly elevated troponin: Likely demand ischemia related to CHF and hypoxia. Patient denies any chest pain. She was noted to be tachycardic.  Likely related to be diagnosed.  We will follow-up with CT angiogramfor further evaluation of lung parenchyma.  Hyperlipidemia Continue statin.  GERD:Protonix  Hearing impairment:  [place], MD 10/12/2020, 1:49 PM

## 2020-10-12 NOTE — ED Notes (Signed)
Lights dimmed for patient comfort at this time. Pt states she is feeling a little better at this time.

## 2020-10-12 NOTE — Plan of Care (Signed)
?  Problem: Education: ?Goal: Ability to verbalize understanding of medication therapies will improve ?Outcome: Progressing ?  ?Problem: Activity: ?Goal: Capacity to carry out activities will improve ?Outcome: Progressing ?  ?Problem: Cardiac: ?Goal: Ability to achieve and maintain adequate cardiopulmonary perfusion will improve ?Outcome: Progressing ?  ?

## 2020-10-12 NOTE — ED Notes (Signed)
Pt provided with meal tray at this time 

## 2020-10-12 NOTE — ED Notes (Signed)
EDP at bedside to re-assess patient.

## 2020-10-12 NOTE — ED Provider Notes (Signed)
[facility] Emergency Department Provider Note  Time seen: 11:13 AM  I have reviewed the triage vital signs and the nursing notes.   HISTORY  Chief Complaint Shortness of breath   HPI Roberta [name] is a 75 y.o. female with a past medical history of anemia, COPD, hyperlipidemia, gastric reflux, presents to the emergency department for shortness of breath.  According to the patient over the past week or so she has had progressively worsening shortness of breath.  She went to her primary care was found to be satting in the 80s and sent to the emergency department for further evaluation.  Patient states she has increased the amount of pillows that she sleeps on from 2-4 and states sometimes she will sleep in a recliner as well.  Patient denies any chest pain at any point.  States she does have a slight cough but states this is chronic.  Denies any lower extremity edema.  Patient does not wear oxygen at baseline.  Currently satting 85% on room air in the emergency department.   Past Medical History:  Diagnosis Date  . AAA (abdominal aortic aneurysm) ([place])   . Anemia   . Breast cancer ([place]) 2002   left  . COPD (chronic obstructive pulmonary disease) ([place])   . Dyspnea   . Headache   . High cholesterol   . Personal history of chemotherapy   . Small cell lung cancer ([place])   . Tobacco abuse     Patient Active Problem List   Diagnosis Date Noted  . Acute metabolic encephalopathy 42/70/6237  . Elevated troponin 09/07/2020  . AKI (acute kidney injury) ([place]) 09/07/2020  . GERD (gastroesophageal reflux disease) 09/07/2020  . Tobacco abuse 09/07/2020  . Malignant neoplasm of unspecified part of unspecified bronchus or lung ([place]) 02/22/2020  . Cough 02/12/2020  . Rib pain on left side 11/29/2019  . Compression fracture of T6 vertebra ([place]) 11/05/2019  . Weight loss 07/29/2019  . Age-related osteoporosis without current pathological fracture 05/21/2019  .  Compression fracture of T5 vertebra ([place]) 05/06/2019  . Port-A-Cath in place 05/06/2019  . Closed stable burst fracture of fourth lumbar vertebra ([place]) 06/08/2018  . Severe protein-calorie malnutrition ([place]) 10/29/2017  . Sepsis ([place]) 04/28/2017  . Small cell lung cancer, right upper lobe ([place]) 02/23/2017  . Goals of care, counseling/discussion 02/23/2017  . Lung mass   . AAA (abdominal aortic aneurysm) without rupture ([place]) 11/02/2016  . COPD mixed type ([place]) 11/02/2016  . Hyperlipidemia 11/02/2016  . PAD (peripheral artery disease) ([place]) 11/02/2016    Past Surgical History:  Procedure Laterality Date  . ABDOMINAL HYSTERECTOMY    . ARTERY BIOPSY Right 12/05/2017   Procedure: BIOPSY TEMPORAL ARTERY;  Surgeon: Margaretha Sheffield, MD;  Location: [facility];  Service: ENT;  Laterality: Right;  . ENDOBRONCHIAL ULTRASOUND N/A 02/19/2017   Procedure: ENDOBRONCHIAL ULTRASOUND;  Surgeon: Flora Lipps, MD;  Location: [facility];  Service: Cardiopulmonary;  Laterality: N/A;  . ENDOVASCULAR REPAIR/STENT GRAFT N/A 12/06/2016   Procedure: Endovascular Repair/Stent Graft;  Surgeon: Katha Cabal, MD;  Location: [facility];  Service: Cardiovascular;  Laterality: N/A;  . IR FLUORO GUIDE PORT INSERTION RIGHT  03/02/2017  . KYPHOPLASTY N/A 06/11/2018   Procedure: KYPHOPLASTY L4 BIOPSY WITH RFA;  Surgeon: Hessie Knows, MD;  Location: [facility];  Service: Orthopedics;  Laterality: N/A;  . KYPHOPLASTY N/A 12/16/2019   Procedure: T6 KYPHOPLASTY;  Surgeon: Hessie Knows, MD;  Location: [facility];  Service: Orthopedics;  Laterality: N/A;  .  MASTECTOMY Left 2002   with sentinel node     Prior to Admission medications   Medication Sig Start Date End Date Taking? Authorizing Provider  acetaminophen (TYLENOL) 325 MG tablet Take 650 mg by mouth every 6 (six) hours as needed for mild pain.     [provider]  albuterol (PROVENTIL HFA;VENTOLIN HFA) 108 (90 Base) MCG/ACT inhaler Inhale 2 puffs into the  lungs every 6 (six) hours as needed for wheezing or shortness of breath. 03/23/16   Frederich Cha, MD  aspirin EC 81 MG EC tablet Take 1 tablet (81 mg total) by mouth daily. Swallow whole. 09/09/20   Bonnielee Haff, MD  atorvastatin (LIPITOR) 10 MG tablet Take 10 mg by mouth at bedtime.  08/11/16   [provider]  fluticasone-salmeterol (ADVAIR HFA) 115-21 MCG/ACT inhaler Inhale 2 puffs into the lungs 2 (two) times daily.    [provider]  omeprazole (PRILOSEC) 20 MG capsule Take 1 capsule (20 mg total) by mouth daily. 09/27/20   Jacquelin Hawking, NP  vitamin B-12 (CYANOCOBALAMIN) 1000 MCG tablet Take 1 tablet (1,000 mcg total) by mouth daily. 09/09/20   Bonnielee Haff, MD    No Known Allergies  Family History  Problem Relation Age of Onset  . CAD Mother   . Colon cancer Brother   . Breast cancer Neg Hx     Social History Social History   Tobacco Use  . Smoking status: Current Every Day Smoker    Packs/day: 1.50    Years: 50.00    Pack years: 75.00    Types: Cigarettes  . Smokeless tobacco: Never Used  Vaping Use  . Vaping Use: Never used  Substance Use Topics  . Alcohol use: No  . Drug use: No    Review of Systems Constitutional: Negative for fever. Cardiovascular: Negative for chest pain. Respiratory: Shortness of breath mild cough Gastrointestinal: Negative for abdominal pain, vomiting  Musculoskeletal: Negative for musculoskeletal complaints Skin: Negative for skin complaints  Neurological: Negative for headache All other ROS negative  ____________________________________________   PHYSICAL EXAM:  VITAL SIGNS: ED Triage Vitals  Enc Vitals Group     BP      Pulse      Resp      Temp      Temp src      SpO2      Weight      Height      Head Circumference      Peak Flow      Pain Score      Pain Loc      Pain Edu?      Excl. in [place]?     Constitutional: Alert and oriented. Well appearing and in no distress. Eyes: Normal exam ENT       Head: Normocephalic and atraumatic.      Mouth/Throat: Mucous membranes are moist. Cardiovascular: Normal rate, regular rhythm. Respiratory: Mild tachypnea with mild diffuse expiratory wheezing. Gastrointestinal: Soft and nontender. No distention.   Musculoskeletal: Nontender with normal range of motion in all extremities.  Minimal pedal edema bilaterally. Neurologic:  Normal speech and language. No gross focal neurologic deficits  Skin:  Skin is warm, dry and intact.  Psychiatric: Mood and affect are normal.   ____________________________________________    EKG  EKG viewed and interpreted by myself shows sinus tachycardia 117 bpm with a narrow QRS, normal axis, normal intervals, nonspecific ST changes.  Respiratory interference  ____________________________________________    RADIOLOGY  Chest x-ray suspicious  for pulmonary edema  ____________________________________________   INITIAL IMPRESSION / ASSESSMENT AND PLAN / ED COURSE  Pertinent labs & imaging results that were available during my care of the patient were reviewed by me and considered in my medical decision making (see chart for details).   Patient presents to the emergency department for shortness of breath worsening over the past few days.  No O2 baseline requirement currently satting in the 80s on room air.  We will place the patient on 2 L nasal cannula, does duo nebs and Solu-Medrol.  We will check labs, chest x-ray, COVID swab and reassess.  Patient agreeable to plan of care.  Patient's work-up showed an elevated BNP and chest x-ray suspicious for pulmonary edema.  Highly suspect CHF exacerbation leading to her shortness of breath.  Currently on 4 L nasal cannula satting in the low 90s.  Patient given IV Lasix.  We will admit to the hospital service for further work-up and treatment.  Patient agreeable to plan of care.  Roberta [name] was evaluated in Emergency Department on 10/12/2020 for the symptoms  described in the history of present illness. She was evaluated in the context of the global COVID-19 pandemic, which necessitated consideration that the patient might be at risk for infection with the SARS-CoV-2 virus that causes COVID-19. Institutional protocols and algorithms that pertain to the evaluation of patients at risk for COVID-19 are in a state of rapid change based on information released by regulatory bodies including the CDC and federal and state organizations. These policies and algorithms were followed during the patient's care in the ED.  ____________________________________________   FINAL CLINICAL IMPRESSION(S) / ED DIAGNOSES  Dyspnea CHF exacerbation   Harvest Dark, MD 10/12/20 1245

## 2020-10-12 NOTE — Progress Notes (Signed)
   10/12/20 2025  Assess: MEWS Score  Temp 97.7 F (36.5 C)  BP (!) 147/78  Pulse Rate (!) 115  Resp 18  Level of Consciousness Alert  SpO2 94 %  O2 Device Nasal Cannula  O2 Flow Rate (L/min) 4 L/min  Assess: MEWS Score  MEWS Temp 0  MEWS Systolic 0  MEWS Pulse 2  MEWS RR 0  MEWS LOC 0  MEWS Score 2  MEWS Score Color Yellow  Assess: if the MEWS score is Yellow or Red  Were vital signs taken at a resting state? Yes  Focused Assessment No change from prior assessment  Early Detection of Sepsis Score *See Row Information* Low  MEWS guidelines implemented *See Row Information* No, previously yellow, continue vital signs every 4 hours  Treat  MEWS Interventions Administered scheduled meds/treatments  Pain Scale 0-10  Pain Score 0  Notify: Charge Nurse/RN  Name of Charge Nurse/RN Notified Melissa, RN  Date Charge Nurse/RN Notified 10/12/20  Time Charge Nurse/RN Notified 2045  Document  Patient Outcome Stabilized after interventions  Progress note created (see row info) Yes

## 2020-10-12 NOTE — Progress Notes (Signed)
OT Cancellation Note  Patient Details Name: Roberta [name] MRN: [id number] DOB: 1945-10-06   Cancelled Treatment:    Reason Eval/Treat Not Completed: Other (comment). Consult received, chart reviewed. Pt noted with recent HR in high 120's, RR elevated, and BP elevated. Will hold OT and re-attempt next date as appropriate.   Hanley Hays, MPH, MS, OTR/L ascom [phone] 10/12/20, 2:06 PM

## 2020-10-13 ENCOUNTER — Inpatient Hospital Stay (HOSPITAL_COMMUNITY)
Admit: 2020-10-13 | Discharge: 2020-10-13 | Disposition: A | Discharge status: Home / Self Care | Payer: Medicare HMO | Attending: Internal Medicine | Admitting: Internal Medicine

## 2020-10-13 DIAGNOSIS — R0603 Acute respiratory distress: Secondary | ICD-10-CM | POA: Diagnosis not present

## 2020-10-13 LAB — COMPREHENSIVE METABOLIC PANEL
ALT: 16 U/L (ref 0–44)
AST: 22 U/L (ref 15–41)
Albumin: 3 g/dL — ABNORMAL LOW (ref 3.5–5.0)
Alkaline Phosphatase: 94 U/L (ref 38–126)
Anion gap: 11 (ref 5–15)
BUN: 17 mg/dL (ref 8–23)
CO2: 26 mmol/L (ref 22–32)
Calcium: 8.6 mg/dL — ABNORMAL LOW (ref 8.9–10.3)
Chloride: 87 mmol/L — ABNORMAL LOW (ref 98–111)
Creatinine, Ser: 0.88 mg/dL (ref 0.44–1.00)
GFR, Estimated: >60 mL/min (ref >60)
Glucose, Bld: 139 mg/dL — ABNORMAL HIGH (ref 70–99)
Potassium: 3.9 mmol/L (ref 3.5–5.1)
Sodium: 124 mmol/L — ABNORMAL LOW (ref 135–145)
Total Bilirubin: 0.8 mg/dL (ref 0.3–1.2)
Total Protein: 6.4 g/dL — ABNORMAL LOW (ref 6.5–8.1)

## 2020-10-13 LAB — ECHOCARDIOGRAM COMPLETE
AR max vel: 1.04 cm2
AV Area VTI: 1.01 cm2
AV Area mean vel: 1 cm2
AV Mean grad: 7.3 mmHg
AV Peak grad: 13.2 mmHg
Ao pk vel: 1.82 m/s
Area-P 1/2: 6.65 cm2
Calc EF: 29.6 %
Height: 67 in
MV VTI: 1.18 cm2
S' Lateral: 4.81 cm
Single Plane A2C EF: 31.4 %
Single Plane A4C EF: 33.2 %
Weight: 2470.4 oz

## 2020-10-13 LAB — CBC WITH DIFFERENTIAL/PLATELET
Abs Immature Granulocytes: 0.15 10*3/uL — ABNORMAL HIGH (ref 0.00–0.07)
Basophils Absolute: 0 10*3/uL (ref 0.0–0.1)
Basophils Relative: 0 %
Eosinophils Absolute: 0 10*3/uL (ref 0.0–0.5)
Eosinophils Relative: 0 %
HCT: 31.9 % — ABNORMAL LOW (ref 36.0–46.0)
Hemoglobin: 10.4 g/dL — ABNORMAL LOW (ref 12.0–15.0)
Immature Granulocytes: 1 %
Lymphocytes Relative: 3 %
Lymphs Abs: 0.5 10*3/uL — ABNORMAL LOW (ref 0.7–4.0)
MCH: 29.3 pg (ref 26.0–34.0)
MCHC: 32.6 g/dL (ref 30.0–36.0)
MCV: 89.9 fL (ref 80.0–100.0)
Monocytes Absolute: 0.5 10*3/uL (ref 0.1–1.0)
Monocytes Relative: 3 %
Neutro Abs: 13.4 10*3/uL — ABNORMAL HIGH (ref 1.7–7.7)
Neutrophils Relative %: 93 %
Platelets: 358 10*3/uL (ref 150–400)
RBC: 3.55 MIL/uL — ABNORMAL LOW (ref 3.87–5.11)
RDW: 14.8 % (ref 11.5–15.5)
WBC: 14.5 10*3/uL — ABNORMAL HIGH (ref 4.0–10.5)
nRBC: 0 % (ref 0.0–0.2)

## 2020-10-13 LAB — LACTIC ACID, PLASMA
Lactic Acid, Venous: 0.9 mmol/L (ref 0.5–1.9)
Lactic Acid, Venous: 0.9 mmol/L (ref 0.5–1.9)

## 2020-10-13 LAB — PROTIME-INR
INR: 1 (ref 0.8–1.2)
Prothrombin Time: 13.3 seconds (ref 11.4–15.2)

## 2020-10-13 LAB — APTT: aPTT: 36 seconds (ref 24–36)

## 2020-10-13 MED ORDER — ADULT MULTIVITAMIN W/MINERALS CH
1.0000 | ORAL_TABLET | Freq: Every day | ORAL | Status: DC
  Administered 2020-10-14 – 2020-10-17 (×4): 1 via ORAL
  Filled 2020-10-13 (×4): qty 1

## 2020-10-13 MED ORDER — METHYLPREDNISOLONE SODIUM SUCC 40 MG IJ SOLR
40.0000 mg | Freq: Three times a day (TID) | INTRAMUSCULAR | Status: DC
  Administered 2020-10-13 – 2020-10-15 (×7): 40 mg via INTRAVENOUS
  Filled 2020-10-13 (×7): qty 1

## 2020-10-13 MED ORDER — BUDESONIDE 0.25 MG/2ML IN SUSP
0.2500 mg | Freq: Two times a day (BID) | RESPIRATORY_TRACT | Status: DC
  Administered 2020-10-13 – 2020-10-17 (×9): 0.25 mg via RESPIRATORY_TRACT
  Filled 2020-10-13 (×9): qty 2

## 2020-10-13 MED ORDER — ENSURE ENLIVE PO LIQD
237.0000 mL | Freq: Two times a day (BID) | ORAL | Status: DC
  Administered 2020-10-13 – 2020-10-17 (×8): 237 mL via ORAL

## 2020-10-13 MED ORDER — SODIUM CHLORIDE 0.9 % IV BOLUS (SEPSIS)
500.0000 mL | Freq: Once | INTRAVENOUS | Status: AC
  Administered 2020-10-13: 500 mL via INTRAVENOUS

## 2020-10-13 MED ORDER — ARFORMOTEROL TARTRATE 15 MCG/2ML IN NEBU
15.0000 ug | INHALATION_SOLUTION | Freq: Two times a day (BID) | RESPIRATORY_TRACT | Status: DC
  Administered 2020-10-13 – 2020-10-17 (×8): 15 ug via RESPIRATORY_TRACT
  Filled 2020-10-13 (×10): qty 2

## 2020-10-13 MED ORDER — IPRATROPIUM-ALBUTEROL 0.5-2.5 (3) MG/3ML IN SOLN
3.0000 mL | RESPIRATORY_TRACT | Status: DC
  Administered 2020-10-13 – 2020-10-14 (×9): 3 mL via RESPIRATORY_TRACT
  Filled 2020-10-13 (×8): qty 3

## 2020-10-13 NOTE — Progress Notes (Signed)
Initial Nutrition Assessment  DOCUMENTATION CODES:   Not applicable  INTERVENTION:   Ensure Enlive po BID, each supplement provides 350 kcal and 20 grams of protein  MVI po daily   Liberalize pt's diet   NUTRITION DIAGNOSIS:   Increased nutrient needs related to chronic illness (NSCLC, COPD, breast cancer) as evidenced by estimated needs.  GOAL:   Patient will meet greater than or equal to 90% of their needs  MONITOR:   PO intake,Supplement acceptance,Labs,Weight trends,Skin,I & O's  REASON FOR ASSESSMENT:   Consult Assessment of nutrition requirement/status  ASSESSMENT:   75 y.o. 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  RD working remotely.  Unable to reach pt by phone. Per chart review, pt with intermittent poor appetite and oral intake at times r/t her cancer treatments. Pt with h/o severe malnutrition. Pt does enjoy Ensure and has been compliant with drinking this in the past. Pt documented to have eaten 100% of her breakfast this morning. RD will add supplements and MVI to help pt meet her estimated needs. RD will also liberalize the heart healthy portion of pt's diet as this is restrictive of protein. Per chart review, pt's UBW is ~130-135lbs. Pt is currently up ~ 20lbs from her UBW. RD will obtain nutrition related history and exam at follow up  Medications reviewed and include: aspirin, lovenox, lasix, solu-medrol, nicotine, protonix, B12  Labs reviewed: Na 124(L), K 3.9 wnl Wbc- 14.5(H), Hgb 10.4(L), Hct 31.9(L)  NUTRITION - FOCUSED PHYSICAL EXAM: Unable to perform at this time   Diet Order:   Diet Order            Diet 2 gram sodium Room service appropriate? Yes; Fluid consistency: Thin  Diet effective now                 EDUCATION NEEDS:   No education needs have been identified at this time  Skin:  Skin Assessment: Reviewed RN Assessment (ecchymosis)  Last BM:  pta  Height:   Ht Readings from Last 1 Encounters:  10/12/20 5\' 7"  (1.702 m)    Weight:   Wt Readings from Last 1 Encounters:  10/12/20 70 kg    Ideal Body Weight:  61.36 kg  BMI:  Body mass index is 24.18 kg/m.  Estimated Nutritional Needs:   Kcal:  1700-2000kcal/day  Protein:  85-100g/day  Fluid:  1.6-1.9L/day  Koleen Distance MS, RD, LDN Please refer to [facility] for RD and/or RD on-call/weekend/after hours pager

## 2020-10-13 NOTE — Evaluation (Signed)
Physical Therapy Evaluation Patient Details Name: Roberta [name] MRN: [id number] DOB: 02-Dec-1945 Today's Date: 10/13/2020   History of Present Illness  Roberta [name] is a 3yoF who comes [facility] on 5/[facility] c SOB. PMH: LungCA s/p lobectomy, COPD (not on O2), tobacco dependence, BrCA s/p mastectomy, AAA s/p endovascular repair, PVD, B12 deficiency, vertebroplasties (T6, T4, L4). Pt admitted in COPD exacerbation.  Clinical Impression  Pt admitted with above diagnosis. Pt currently with functional limitations due to the deficits listed below (see "PT Problem List"). Upon entry, pt in recliner, awake and agreeable to participate. DTR [name] and [place] in room, provide HPI/PLOF due to pt [facility]. The pt is alert, pleasant, interactive, and follow basic commands, cues when spoke loudly with gestures. Pt expresses urgency to void, has high volume urine incontinence in recliner while author is preparing BR for mobility. Author assists with cleanup of patient, then recliner, then bed, then pure wick installation at end of session. Pt performs SPT with minA for intermittent LOB correction, needs same when turing with RW in AMB. Pt is much weaker than her baseline strength and requires physical assist for mobility. AMB limited due to resting tachycardia in 110s, however only low 120s s/p AMB to door and back. On 4L/min pt remains 88% with AMB. Patient's performance this date reveals decreased ability, independence, and tolerance in performing all basic mobility required for performance of activities of daily living. Pt requires additional DME, close physical assistance, and cues for safe participate in mobility. Pt will benefit from skilled PT intervention to increase independence and safety with basic mobility in preparation for discharge to the venue listed below.       Follow Up Recommendations Home health PT;Supervision for mobility/OOB;Supervision - Intermittent    Equipment Recommendations        Recommendations for Other Services       Precautions / Restrictions Precautions Precautions: Fall Precaution Comments: Monitor HR 02 Restrictions Weight Bearing Restrictions: No      Mobility  Bed Mobility Overal bed mobility: Modified Independent;Needs Assistance Bed Mobility: Supine to Sit     Supine to sit: Min assist     General bed mobility comments: sitting in recliner upon entry/end of session    Transfers Overall transfer level: Needs assistance Equipment used: Rolling walker (2 wheeled) Transfers: Sit to/from [facility] Sit to Stand: Min assist Stand pivot transfers: Min assist       General transfer comment: poor control of legs upon stnading, difficulty achiving balance due to weakness (HR 117bpm resting in room, on 4L O2)  Ambulation/Gait Ambulation/Gait assistance: Min assist Gait Distance (Feet): 40 Feet Assistive device: Rolling walker (2 wheeled) Gait Pattern/deviations: Step-to pattern     General Gait Details: mostly steady with RW in straight plane, but LOB with leg collapse when turning, authro provides minA to arrest Fall. s/p AMB: SpO2: 88% on 4L, no reports of dyspnea, HR 122bpm.  Stairs            Wheelchair Mobility    Modified Rankin (Stroke Patients Only)       Balance Overall balance assessment: Needs assistance Sitting-balance support: Single extremity supported;Feet supported Sitting balance-Leahy Scale: Good Sitting balance - Comments: fatigues quickly   Standing balance support: During functional activity;Bilateral upper extremity supported Standing balance-Leahy Scale: Fair                               Pertinent Vitals/Pain Pain Assessment:  No/denies pain    Home Living Family/patient expects to be discharged to:: Private residence Living Arrangements: Children ([place]) Available Help at Discharge: Available 24 hours/day (Granddaughter helps as well [name].) Type of Home:  House Home Access: Level entry;Stairs to enter Entrance Stairs-Rails: Right;Left (Typically performs with 1 rail adn 1 DTR) Entrance Stairs-Number of Steps: 5 Home Layout: One level Home Equipment: [place] - 2 wheels;Bedside commode;Cane - single point;Tub bench;Walker - 4 wheels      Prior Function Level of Independence: Independent with assistive device(s)   Gait / Transfers Assistance Needed: SPC in house with 'touch points', but will use RW or 4WW wshen out ofor church/appoints  ADL's / Homemaking Assistance Needed: modified independent, uses tub bench to bathe;        Hand Dominance   Dominant Hand: Right    Extremity/Trunk Assessment   Upper Extremity Assessment Upper Extremity Assessment: Generalized weakness;Overall WFL for tasks assessed    Lower Extremity Assessment Lower Extremity Assessment: Generalized weakness       Communication   Communication: HOH  Cognition Arousal/Alertness: Awake/alert Behavior During Therapy: WFL for tasks assessed/performed Overall Cognitive Status: Difficult to assess                                 General Comments: DTR reports mentation near baseline      General Comments General comments (skin integrity, edema, etc.): Spo2 remains 97-99% t/o session with pt on 4 L [place]. HR reaches 121 bpm with activity, but returns to low 100's with therapeutic rest break. RN notified.    Exercises Other Exercises Other Exercises: Pt/caregivers educated on energy conservation strategies, falls prevention techniques, and safe use of AE/DME for ADL management. Other Exercises: Therapist facilitates LB dressing task, bed mobility, and functional mobilty in room.   Assessment/Plan    PT Assessment Patient needs continued PT services  PT Problem List Decreased strength;Decreased activity tolerance;Decreased balance;Decreased mobility;Decreased safety awareness;Decreased cognition       PT Treatment Interventions DME  instruction;Gait training;Balance training;Stair training;Functional mobility training;Therapeutic activities;Therapeutic exercise;Patient/family education    PT Goals (Current goals can be found in the Care Plan section)  Acute Rehab PT Goals Patient Stated Goal: return to home and regain baselin independence PT Goal Formulation: With patient Time For Goal Achievement: 10/27/20 Potential to Achieve Goals: Good    Frequency Min 2X/week   Barriers to discharge        Co-evaluation               AM-PAC PT "6 Clicks" Mobility  Outcome Measure Help needed turning from your back to your side while in a flat bed without using bedrails?: A Little Help needed moving from lying on your back to sitting on the side of a flat bed without using bedrails?: A Little Help needed moving to and from a bed to a chair (including a wheelchair)?: A Lot Help needed standing up from a chair using your arms (e.g., wheelchair or bedside chair)?: A Lot Help needed to walk in hospital room?: A Lot Help needed climbing 3-5 steps with a railing? : A Lot 6 Click Score: 14    End of Session Equipment Utilized During Treatment: Oxygen Activity Tolerance: Patient tolerated treatment well;Treatment limited secondary to medical complications (Comment) (tachycardic) Patient left: in chair;with family/visitor present;with call bell/phone within reach Nurse Communication: Mobility status (need for purewick adjustment) PT Visit Diagnosis: Unsteadiness on feet (R26.81);Difficulty in walking, not  elsewhere classified (R26.2);Muscle weakness (generalized) (M62.81)    Time: 1059-1130 PT Time Calculation (min) (ACUTE ONLY): 31 min   Charges:   PT Evaluation $PT Eval Moderate Complexity: 1 Mod PT Treatments $Therapeutic Exercise: 8-22 mins        2:03 PM, 10/13/20 Etta Grandchild, PT, DPT Physical Therapist - [facility]  [phone] ([place])    [place]  C 10/13/2020, 1:59 PM

## 2020-10-13 NOTE — Progress Notes (Signed)
PROGRESS NOTE    Roberta [name]  CNO:[id number] DOB: 04/29/1946 DOA: 10/12/2020 PCP: Gayland Curry, MD   Brief Narrative:  75 y.o. 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  She presented to the emergency room today on account of worsening shortness of breath.  Onset of symptoms was 1 day prior to presentation.  She has been unable to sleep due to orthopnea and paroxysms of dyspnea.  She denied any associated chest pain palpitations nausea or vomiting.  Denied any lower extremity edema.  She denied any prior history of CHF diagnosis.  Patient started on a regimen of nebulizers, intravenous steroids, diuretics.  Echocardiogram ordered and pending at time of this note.   Assessment & Plan:   Active Problems:   COPD with acute exacerbation (HCC)  COPD with acute exacerbation Acute hypoxic respiratory failure Patient has longstanding COPD diagnosis Continue to smoke cigarettes Plan: IV Solu-Medrol 40 mg every 8 hours DuoNeb scheduled every 4 hours [place] twice daily Pulmicort twice daily Azithromycin Wean oxygen as tolerated Goal saturation 88-92% Follow-up echocardiogram  Possible new onset congestive heart failure Echocardiogram ordered and pending Plan: Continue diuretics Follow-up echocardiogram Cardiac cardiology for further recommendations  Acute hyponatremia Unclear etiology Initially felt to be hypervolemic due to fluid overload SIADH remains on differential Worsening since admission Plan: Continue diuretics as above Fluid restrict Monitor daily serum sodium We will reach out to nephrology if sodium remains low  Sinus tachycardia Troponin elevation Patient denies palpitations or chest pain CT angiogram negative for PE Troponin  not indicative of ACS Plan: Continue telemetry monitoring Vitals per unit protocol Treatment for CHF and COPD as above  Hyperlipidemia Statin  GERD PPI  History of right upper lobe small cell lung cancer Currently not on chemotherapy Status post surgery, chemotherapy, XRT Outpatient follow-up    DVT prophylaxis: SQ Lovenox Code Status: Full Family Communication: Daughter [name] [phone] on 5/18 Disposition Plan: Status is: Inpatient  Remains inpatient appropriate because:Inpatient level of care appropriate due to severity of illness   Dispo: The patient is from: Home              Anticipated d/c is to: Home              Patient currently is not medically stable to d/c.   Difficult to place patient No  Acute hypoxic respiratory failure secondary to decompensated COPD versus CHF.  Anticipate a least 48 hours prior to disposition.     Level of care: Progressive Cardiac  Consultants:   Cardiology  Procedures:   None   Antimicrobials: Azithromycin   Subjective: Patient seen and examined.  Continues to endorse shortness of breath but does endorse some interval improvement since admission.  Pain complaints  Objective: Vitals:   10/13/20 0000 10/13/20 0456 10/13/20 0758 10/13/20 0903  BP: 129/81 127/83 (!) 139/96   Pulse: (!) 124 (!) 131 (!) 109   Resp: 18 20 18    Temp: 97.9 F (36.6 C) 97.6 F (36.4 C) 98.3 F (36.8 C)   TempSrc:   Oral   SpO2: 94% 96% 98% 98%  Weight:      Height:        Intake/Output Summary (Last 24 hours) at 10/13/2020 1226 Last data filed at 10/13/2020 1019 Gross per 24 hour  Intake 1560 ml  Output 1550  ml  Net 10 ml   Filed Weights   10/12/20 1118  Weight: 70 kg    Examination:  General exam: Appears calm and comfortable  Respiratory system: Bilateral scattered crackles.  End expiratory wheeze.  Normal work of breathing.  3 L Cardiovascular system: S1 & S2 heard, RRR. No JVD, murmurs, rubs, gallops or clicks. No  pedal edema. Gastrointestinal system: Abdomen is nondistended, soft and nontender. No organomegaly or masses felt. Normal bowel sounds heard. Central nervous system: Alert and oriented. No focal neurological deficits. Extremities: Symmetric 5 x 5 power. Skin: No rashes, lesions or ulcers Psychiatry: Judgement and insight appear normal. Mood & affect appropriate.     Data Reviewed: I have personally reviewed following labs and imaging studies  CBC: Recent Labs  Lab 10/12/20 1112 10/13/20 0223  WBC 11.3* 14.5*  NEUTROABS  --  13.4*  HGB 10.9* 10.4*  HCT 33.4* 31.9*  MCV 90.5 89.9  PLT 441* 093   Basic Metabolic Panel: Recent Labs  Lab 10/12/20 1112 10/12/20 2115 10/13/20 0223  NA 125* 127* 124*  K 4.2  --  3.9  CL 89*  --  87*  CO2 25  --  26  GLUCOSE 113*  --  139*  BUN 16  --  17  CREATININE 0.82  --  0.88  CALCIUM 8.6*  --  8.6*   GFR: Estimated Creatinine Clearance: 54.5 mL/min (by C-G formula based on SCr of 0.88 mg/dL). Liver Function Tests: Recent Labs  Lab 10/12/20 1112 10/13/20 0223  AST 24 22  ALT 17 16  ALKPHOS 107 94  BILITOT 0.8 0.8  PROT 6.6 6.4*  ALBUMIN 3.2* 3.0*   No results for input(s): LIPASE, AMYLASE in the last 168 hours. No results for input(s): AMMONIA in the last 168 hours. Coagulation Profile: Recent Labs  Lab 10/13/20 0223  INR 1.0   Cardiac Enzymes: No results for input(s): CKTOTAL, CKMB, CKMBINDEX, TROPONINI in the last 168 hours. BNP (last 3 results) No results for input(s): PROBNP in the last 8760 hours. HbA1C: No results for input(s): HGBA1C in the last 72 hours. CBG: No results for input(s): GLUCAP in the last 168 hours. Lipid Profile: No results for input(s): CHOL, HDL, LDLCALC, TRIG, CHOLHDL, LDLDIRECT in the last 72 hours. Thyroid Function Tests: No results for input(s): TSH, T4TOTAL, FREET4, T3FREE, THYROIDAB in the last 72 hours. Anemia Panel: No results for input(s): VITAMINB12, FOLATE, FERRITIN, TIBC, IRON,  RETICCTPCT in the last 72 hours. Sepsis Labs: Recent Labs  Lab 10/13/20 0223 10/13/20 0603  LATICACIDVEN 0.9 0.9    Recent Results (from the past 240 hour(s))  Resp Panel by RT-PCR (Flu A&B, Covid) Nasopharyngeal Swab     Status: None   Collection Time: 10/12/20 11:21 AM   Specimen: Nasopharyngeal Swab; Nasopharyngeal(NP) swabs in vial transport medium  Result Value Ref Range Status   SARS Coronavirus 2 by RT PCR NEGATIVE NEGATIVE Final    Comment: (NOTE) SARS-CoV-2 target nucleic acids are NOT DETECTED.  The SARS-CoV-2 RNA is generally detectable in upper respiratory specimens during the acute phase of infection. The lowest concentration of SARS-CoV-2 viral copies this assay can detect is 138 copies/mL. A negative result does not preclude SARS-Cov-2 infection and should not be used as the sole basis for treatment or other patient management decisions. A negative result may occur with  improper specimen collection/handling, submission of specimen other than nasopharyngeal swab, presence of viral mutation(s) within the areas targeted by this assay, and inadequate number of viral copies(<138  copies/mL). A negative result must be combined with clinical observations, patient history, and epidemiological information. The expected result is Negative.  Fact Sheet for Patients:  EntrepreneurPulse.com.au  Fact Sheet for Healthcare Providers:  IncredibleEmployment.be  This test is no t yet approved or cleared by the [place] FDA and  has been authorized for detection and/or diagnosis of SARS-CoV-2 by FDA under an Emergency Use Authorization (EUA). This EUA will remain  in effect (meaning this test can be used) for the duration of the COVID-19 declaration under Section 564(b)(1) of the Act, 21 U.S.C.section 360bbb-3(b)(1), unless the authorization is terminated  or revoked sooner.       Influenza A by PCR NEGATIVE NEGATIVE Final   Influenza  B by PCR NEGATIVE NEGATIVE Final    Comment: (NOTE) The Xpert Xpress SARS-CoV-2/FLU/RSV plus assay is intended as an aid in the diagnosis of influenza from Nasopharyngeal swab specimens and should not be used as a sole basis for treatment. Nasal washings and aspirates are unacceptable for Xpert Xpress SARS-CoV-2/FLU/RSV testing.  Fact Sheet for Patients: EntrepreneurPulse.com.au  Fact Sheet for Healthcare Providers: IncredibleEmployment.be  This test is not yet approved or cleared by the [place] FDA and has been authorized for detection and/or diagnosis of SARS-CoV-2 by FDA under an Emergency Use Authorization (EUA). This EUA will remain in effect (meaning this test can be used) for the duration of the COVID-19 declaration under Section 564(b)(1) of the Act, 21 U.S.C. section 360bbb-3(b)(1), unless the authorization is terminated or revoked.  Performed at [facility], [address] [place], [place] [address]   Culture, blood (x 2)     Status: None (Preliminary result)   Collection Time: 10/13/20  2:23 AM   Specimen: BLOOD  Result Value Ref Range Status   Specimen Description BLOOD RIGHT HAND  Final   Special Requests   Final    BOTTLES DRAWN AEROBIC AND ANAEROBIC Blood Culture results may not be optimal due to an inadequate volume of blood received in culture bottles   Culture   Final    NO GROWTH < 12 HOURS Performed at [facility], [address] [place], [place] [address]    Report Status PENDING  Incomplete  Culture, blood (x 2)     Status: None (Preliminary result)   Collection Time: 10/13/20  2:24 AM   Specimen: BLOOD  Result Value Ref Range Status   Specimen Description BLOOD RIGHT FA  Final   Special Requests   Final    BOTTLES DRAWN AEROBIC AND ANAEROBIC Blood Culture adequate volume   Culture   Final    NO GROWTH < 12 HOURS Performed at [facility], [address], [place], [place]  [address]    Report Status PENDING  Incomplete         Radiology Studies: CT ANGIO CHEST PE W OR WO CONTRAST  Result Date: 10/12/2020 CLINICAL DATA:  Positive D-dimer and shortness of breath EXAM: CT ANGIOGRAPHY CHEST WITH CONTRAST TECHNIQUE: Multidetector CT imaging of the chest was performed using the standard protocol during bolus administration of intravenous contrast. Multiplanar CT image reconstructions and MIPs were obtained to evaluate the vascular anatomy. CONTRAST:  69mL OMNIPAQUE IOHEXOL 350 MG/ML SOLN COMPARISON:  Plain film from earlier in the same day, 09/07/2020 CT. FINDINGS: Cardiovascular: Thoracic aorta demonstrates atherosclerotic calcifications without aneurysmal dilatation or dissection. Coronary calcifications are noted. No significant cardiac enlargement is noted. The pulmonary artery is well visualized within normal branching pattern even on the right where significant  post irradiation changes are noted. Mediastinum/Nodes: Thoracic inlet is within normal limits. The esophagus is unremarkable. No sizable hilar or mediastinal adenopathy is noted. Lungs/Pleura: Bilateral pleural effusions are noted right considerably greater than left. Post radiation changes are noted centrally in the right lung. The degree of consolidation is similar to that seen on prior plain film but progressed when compared with the prior CT examination dated 09/07/2020. Left lung demonstrates some patchy ground-glass airspace opacity likely postinflammatory in nature. Upper Abdomen: Stent graft is noted in the upper abdomen. No other focal abnormality is seen. Musculoskeletal: Degenerative changes of the thoracic spine are noted. No acute bony abnormality is noted. Review of the MIP images confirms the above findings. IMPRESSION: Increasing consolidation in the right lung when compared with the prior exam representing a combination of previously seen post irradiation changes as well as acute pneumonic infiltrate.  Large right-sided pleural effusion is noted. Small left pleural effusion with patchy airspace opacity consistent with multifocal pneumonia. No evidence of pulmonary emboli. Aortic Atherosclerosis (ICD10-I70.0). Electronically Signed   By: Inez Catalina M.D.   On: 10/12/2020 21:54   DG Chest Portable 1 View  Result Date: 10/12/2020 CLINICAL DATA:  Shortness of breath. Previous history of lung carcinoma and radiation therapy. EXAM: PORTABLE CHEST 1 VIEW COMPARISON:  09/06/2020 FINDINGS: Heart size and mediastinal contours are stable. Extensive pleural-parenchymal scarring and volume loss again seen in the right upper lobe. A new small right pleural effusion is seen, with atelectasis or infiltrate at the right lung base. Increased diffuse interstitial infiltrates are also seen compared to previous study with Kerley B-lines, suspicious for diffuse interstitial edema. Left-sided Port-A-Cath remains in place. IMPRESSION: New small right pleural effusion, and right basilar atelectasis versus infiltrate. Increased diffuse interstitial infiltrates, suspicious for pulmonary interstitial edema. Stable post radiation changes in right upper hemithorax. Electronically Signed   By: Marlaine Hind M.D.   On: 10/12/2020 11:54        Scheduled Meds: . arformoterol  15 mcg Nebulization BID  . aspirin EC  81 mg Oral Daily  . atorvastatin  10 mg Oral QHS  . azithromycin  500 mg Oral Daily  . budesonide (PULMICORT) nebulizer solution  0.25 mg Nebulization BID  . enoxaparin (LOVENOX) injection  40 mg Subcutaneous Q24H  . feeding supplement  237 mL Oral BID BM  . furosemide  40 mg Intravenous Daily  . ipratropium-albuterol  3 mL Nebulization Q4H  . mouth rinse  15 mL Mouth Rinse BID  . methylPREDNISolone (SOLU-MEDROL) injection  40 mg Intravenous Q8H  . [START ON 10/14/2020] multivitamin with minerals  1 tablet Oral Daily  . nicotine  14 mg Transdermal Daily  . pantoprazole  40 mg Oral Daily  . sodium chloride flush   3 mL Intravenous Q12H  . vitamin B-12  1,000 mcg Oral Daily   Continuous Infusions: . sodium chloride       LOS: 1 day    Time spent: 25 minutes    Sidney Ace, MD [facility] Pager [phone]  If 7PM-7AM, please contact night-coverage 10/13/2020, 12:26 PM

## 2020-10-13 NOTE — Progress Notes (Signed)
   10/13/20 0920  Clinical Encounter Type  Visited With Patient and family together  Visit Type Initial;Spiritual support;Social support  Referral From Nurse  Consult/Referral To [place] responded to order requestion form the nurse. PT requested that an AD be completed. Chaplain completed education with the PT and her family. PT will fill out the AD paperwork, then she will have her nurse contact the on-call chaplain when she is ready for it to be notarized. Chaplain ministered with presence.

## 2020-10-13 NOTE — Evaluation (Signed)
Occupational Therapy Evaluation Patient Details Name: Roberta [name] MRN: [id number] DOB: [birth year] Today's Date: 10/13/2020    History of Present Illness Roberta [name] is a 75 y.o. female with medical history significant of SCLC (s/p of surgery, chemotherapy and brain radiation therapy), HLD, COPD, GERD, tobacco abuse, breast CA (s/p of bilateral mastectomy, radiation and chemotherapy), AAA, anemia, PAD, who presents with AMS. Pt presents to the emergency department with c/o shortness of breath. Work-up in the ED including chest x-ray revealed small right pleural effusion with increased diffuse interstitial infiltrates suspicious for pulmonary edema.  BNP noted to be markedly elevated.  Labs were also significant for hyponatremia.   Clinical Impression   Roberta [name] was seen for OT evaluation this date. Per pt/dtr pt was modified independent in all ADL and functional mobility, living in a 1 levelhome with ~5 STE. Pt/dtr deny use of supplemental O2 in the home. Pt reports becoming easily fatigued or out of breath with minimal exertion since last week. Pt currently requires set-up/supervision assist for functional mobility and exertional ADL tasks due to current functional impairments (See OT Problem List below). Pt educated in energy conservation strategies including pursed lip breathing, activity pacing, home/routines modifications, work simplification, AE/DME, prioritizing of meaningful occupations, and falls prevention. Pt verbalized understanding and would benefit from additional skilled OT services to maximize recall and carryover of learned techniques and facilitate implementation of learned techniques into daily routines. Upon discharge, recommend [place] services.       Follow Up Recommendations  Home health OT;Supervision - Intermittent    Equipment Recommendations  None recommended by OT (Pt has necessary equipment.)    Recommendations for Other Services       Precautions /  Restrictions Precautions Precautions: Fall Precaution Comments: Monitor HR 02 Restrictions Weight Bearing Restrictions: No      Mobility Bed Mobility Overal bed mobility: Modified Independent;Needs Assistance Bed Mobility: Supine to Sit     Supine to sit: Min assist     General bed mobility comments: Pt comes to sitting EOB with min A to advance hips forward.    Transfers Overall transfer level: Needs assistance Equipment used: Rolling walker (2 wheeled) Transfers: Sit to/from [facility] Sit to Stand: Supervision Stand pivot transfers: Supervision            Balance Overall balance assessment: Needs assistance Sitting-balance support: Single extremity supported;Feet supported Sitting balance-Leahy Scale: Good Sitting balance - Comments: fatigues quickly   Standing balance support: During functional activity;Bilateral upper extremity supported Standing balance-Leahy Scale: Fair                             ADL either performed or assessed with clinical judgement   ADL Overall ADL's : Needs assistance/impaired     Grooming: Sitting;Set up;Supervision/safety               Lower Body Dressing: Set up;Supervision/safety Lower Body Dressing Details (indicate cue type and reason): Pt dons bilat hospital socks while seated upright in bed. Toilet Transfer: RW;BSC;Supervision/safety;Set up           Functional mobility during ADLs: Set up;Supervision/safety;Rolling walker;Cueing for safety       Vision Patient Visual Report: No change from baseline       Perception     Praxis      Pertinent Vitals/Pain Pain Assessment: No/denies pain     Hand Dominance Right   Extremity/Trunk Assessment Upper Extremity Assessment Upper Extremity Assessment:  Generalized weakness   Lower Extremity Assessment Lower Extremity Assessment: Generalized weakness       Communication Communication Communication: HOH   Cognition  Arousal/Alertness: Awake/alert Behavior During Therapy: WFL for tasks assessed/performed                                   General Comments: HOH, hears daughter best.   General Comments  Spo2 remains 97-99% t/o session with pt on 4 L [place]. HR reaches 121 bpm with activity, but returns to low 100's with therapeutic rest break. RN notified.    Exercises Other Exercises Other Exercises: Pt/caregivers educated on energy conservation strategies, falls prevention techniques, and safe use of AE/DME for ADL management. Other Exercises: Therapist facilitates LB dressing task, bed mobility, and functional mobilty in room.   Shoulder Instructions      Home Living Family/patient expects to be discharged to:: Private residence Living Arrangements: Children ([place]) Available Help at Discharge: Available 24 hours/day (Granddaughter helps as well Roberta [name].) Type of Home: House Home Access: Level entry;Stairs to enter [employer] of Steps: 5 Entrance Stairs-Rails: Right;Left (Typically performs with 1 rail adn 1 DTR) Home Layout: One level     Bathroom Shower/Tub: Tub/shower unit;Curtain         Home Equipment: Environmental consultant - 2 wheels;Bedside commode;Cane - single point;Tub bench;Walker - 4 wheels          Prior Functioning/Environment Level of Independence: Independent with assistive device(s)  Gait / Transfers Assistance Needed: SPC in house with 'touch points', but will use RW or 4WW wshen out ofor church/appoints ADL's / Homemaking Assistance Needed: modified independent, uses tub bench to bathe;            OT Problem List: Decreased strength;Decreased knowledge of use of DME or AE;Decreased coordination;Decreased activity tolerance;Decreased safety awareness;Impaired balance (sitting and/or standing);Cardiopulmonary status limiting activity      OT Treatment/Interventions: Self-care/ADL training;Therapeutic exercise;Energy conservation;Therapeutic  activities;DME and/or AE instruction;Patient/family education    OT Goals(Current goals can be found in the care plan section) Acute Rehab OT Goals Patient Stated Goal: To get stronger OT Goal Formulation: With patient Time For Goal Achievement: 10/27/20 Potential to Achieve Goals: Good ADL Goals Pt Will Perform Grooming: with modified independence;sitting (independently implementing appropriate ECS.) Pt Will Transfer to Toilet: ambulating;bedside commode Pt Will Perform Toileting - Clothing Manipulation and hygiene: sit to/from stand;with modified independence  OT Frequency: Min 1X/week   Barriers to D/C:            Co-evaluation              AM-PAC OT "6 Clicks" Daily Activity     Outcome Measure Help from another person eating meals?: None Help from another person taking care of personal grooming?: A Little Help from another person toileting, which includes using toliet, bedpan, or urinal?: A Little Help from another person bathing (including washing, rinsing, drying)?: A Little Help from another person to put on and taking off regular upper body clothing?: A Little Help from another person to put on and taking off regular lower body clothing?: A Little 6 Click Score: 19   End of Session Equipment Utilized During Treatment: Gait belt;Rolling walker;Oxygen Nurse Communication: Mobility status  Activity Tolerance: Patient tolerated treatment well Patient left: in chair;with call bell/phone within reach;with chair alarm set  OT Visit Diagnosis: Other abnormalities of gait and mobility (R26.89);Muscle weakness (generalized) (M62.81)  Time: 1010-1045 OT Time Calculation (min): 35 min Charges:  OT General Charges $OT Visit: 1 Visit OT Evaluation $OT Eval Moderate Complexity: 1 Mod OT Treatments $Self Care/Home Management : 23-37 mins  Shara Blazing, M.S., OTR/L Ascom: [phone] 10/13/20, 11:48 AM

## 2020-10-13 NOTE — Progress Notes (Signed)
*  PRELIMINARY RESULTS* Echocardiogram 2D Echocardiogram has been performed.  Roberta [name] 10/13/2020, 9:01 AM

## 2020-10-14 LAB — CBC WITH DIFFERENTIAL/PLATELET
Abs Immature Granulocytes: 0.13 10*3/uL — ABNORMAL HIGH (ref 0.00–0.07)
Basophils Absolute: 0 10*3/uL (ref 0.0–0.1)
Basophils Relative: 0 %
Eosinophils Absolute: 0 10*3/uL (ref 0.0–0.5)
Eosinophils Relative: 0 %
HCT: 31.8 % — ABNORMAL LOW (ref 36.0–46.0)
Hemoglobin: 10.5 g/dL — ABNORMAL LOW (ref 12.0–15.0)
Immature Granulocytes: 1 %
Lymphocytes Relative: 3 %
Lymphs Abs: 0.3 10*3/uL — ABNORMAL LOW (ref 0.7–4.0)
MCH: 29.7 pg (ref 26.0–34.0)
MCHC: 33 g/dL (ref 30.0–36.0)
MCV: 90.1 fL (ref 80.0–100.0)
Monocytes Absolute: 0.5 10*3/uL (ref 0.1–1.0)
Monocytes Relative: 4 %
Neutro Abs: 11.2 10*3/uL — ABNORMAL HIGH (ref 1.7–7.7)
Neutrophils Relative %: 92 %
Platelets: 410 10*3/uL — ABNORMAL HIGH (ref 150–400)
RBC: 3.53 MIL/uL — ABNORMAL LOW (ref 3.87–5.11)
RDW: 14.9 % (ref 11.5–15.5)
WBC: 12.1 10*3/uL — ABNORMAL HIGH (ref 4.0–10.5)
nRBC: 0.2 % (ref 0.0–0.2)

## 2020-10-14 LAB — BASIC METABOLIC PANEL
Anion gap: 12 (ref 5–15)
BUN: 26 mg/dL — ABNORMAL HIGH (ref 8–23)
CO2: 29 mmol/L (ref 22–32)
Calcium: 9 mg/dL (ref 8.9–10.3)
Chloride: 89 mmol/L — ABNORMAL LOW (ref 98–111)
Creatinine, Ser: 0.87 mg/dL (ref 0.44–1.00)
GFR, Estimated: >60 mL/min (ref >60)
Glucose, Bld: 151 mg/dL — ABNORMAL HIGH (ref 70–99)
Potassium: 3.8 mmol/L (ref 3.5–5.1)
Sodium: 130 mmol/L — ABNORMAL LOW (ref 135–145)

## 2020-10-14 LAB — MAGNESIUM: Magnesium: 1.9 mg/dL (ref 1.7–2.4)

## 2020-10-14 MED ORDER — CHLORHEXIDINE GLUCONATE CLOTH 2 % EX PADS
6.0000 | MEDICATED_PAD | Freq: Every day | CUTANEOUS | Status: DC
  Administered 2020-10-14 – 2020-10-17 (×4): 6 via TOPICAL

## 2020-10-14 MED ORDER — LISINOPRIL 5 MG PO TABS
5.0000 mg | ORAL_TABLET | Freq: Every day | ORAL | Status: DC
  Administered 2020-10-14: 5 mg via ORAL
  Filled 2020-10-14: qty 1

## 2020-10-14 MED ORDER — FUROSEMIDE 10 MG/ML IJ SOLN
40.0000 mg | Freq: Two times a day (BID) | INTRAMUSCULAR | Status: DC
  Administered 2020-10-14 (×2): 40 mg via INTRAVENOUS
  Filled 2020-10-14 (×2): qty 4

## 2020-10-14 MED ORDER — MAGNESIUM SULFATE 2 GM/50ML IV SOLN
2.0000 g | Freq: Once | INTRAVENOUS | Status: AC
  Administered 2020-10-14: 2 g via INTRAVENOUS
  Filled 2020-10-14: qty 50

## 2020-10-14 MED ORDER — IPRATROPIUM-ALBUTEROL 0.5-2.5 (3) MG/3ML IN SOLN
3.0000 mL | Freq: Three times a day (TID) | RESPIRATORY_TRACT | Status: DC
  Administered 2020-10-14 – 2020-10-17 (×8): 3 mL via RESPIRATORY_TRACT
  Filled 2020-10-14 (×8): qty 3

## 2020-10-14 MED ORDER — IPRATROPIUM-ALBUTEROL 0.5-2.5 (3) MG/3ML IN SOLN: 3.0000 mL | RESPIRATORY_TRACT | Status: DC | PRN

## 2020-10-14 MED ORDER — POTASSIUM CHLORIDE CRYS ER 20 MEQ PO TBCR
40.0000 meq | EXTENDED_RELEASE_TABLET | Freq: Once | ORAL | Status: AC
  Administered 2020-10-14: 40 meq via ORAL
  Filled 2020-10-14: qty 2

## 2020-10-14 NOTE — Progress Notes (Signed)
   10/14/20 1315  Clinical Encounter Type  Visited With Patient and family together  Visit Type Follow-up;Spiritual support;Social support  Referral From Nurse  Consult/Referral To [place] responded to a page from the nurse. PT was ready to have her AD notarized. Chaplain ministered with presence. [employer] with presence and said a prayer, at the PT request.

## 2020-10-14 NOTE — Progress Notes (Signed)
PROGRESS NOTE    Roberta [name]  SAY:[id number] DOB: Dec 31, 1945 DOA: 10/12/2020 PCP: Gayland Curry, MD   Brief Narrative:  75 y.o. 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  She presented to the emergency room today on account of worsening shortness of breath.  Onset of symptoms was 1 day prior to presentation.  She has been unable to sleep due to orthopnea and paroxysms of dyspnea.  She denied any associated chest pain palpitations nausea or vomiting.  Denied any lower extremity edema.  She denied any prior history of CHF diagnosis.  Patient started on a regimen of nebulizers, intravenous steroids, diuretics.  Echocardiogram demonstrates systolic congestive heart failure with EF 25 to 30%   Assessment & Plan:   Active Problems:   COPD with acute exacerbation (HCC)  COPD with acute exacerbation Acute hypoxic respiratory failure Patient has longstanding COPD diagnosis Continue to smoke cigarettes Respiratory status improving slowly Plan: Continue IV Solu-Medrol 40 mg every 8 hours DuoNeb scheduled every 4 hours Brovana twice daily Pulmicort twice daily Continue azithromycin for anti-inflammatory effect Wean oxygen as tolerated Goal saturation 88-92%   Newly diagnosed acute on chronic systolic congestive heart failure Echocardiogram EF 25 to 30% with global hypokinesis May warrant ischemic evaluation Plan: Increase Lasix 40 mg IV twice daily Add lisinopril 5 mg daily We will reach out to cardiology for further recommendations   Acute hyponatremia, improving Unclear etiology Initially felt to be hypervolemic due to fluid overload SIADH remains on differential Worsened to 124, now improved to 130 Plan: Continue diuretics as above Fluid  restrict Monitor daily serum sodium We will reach out to nephrology if sodium remains low  Sinus tachycardia Troponin elevation Patient denies palpitations or chest pain CT angiogram negative for PE Troponin not indicative of ACS Plan: Continue telemetry monitoring Vitals per unit protocol Treatment for CHF and COPD as above  Hyperlipidemia Statin  GERD PPI  History of right upper lobe small cell lung cancer Currently not on chemotherapy Status post surgery, chemotherapy, XRT Outpatient follow-up    DVT prophylaxis: SQ Lovenox Code Status: Full Family Communication: Daughter [name] [phone] on 5/18 Disposition Plan: Status is: Inpatient  Remains inpatient appropriate because:Inpatient level of care appropriate due to severity of illness   Dispo: The patient is from: Home              Anticipated d/c is to: Home              Patient currently is not medically stable to d/c.   Difficult to place patient No  Acute hypoxic respiratory failure secondary to decompensated COPD versus CHF.  Anticipate a least 48 hours prior to disposition.     Level of care: Progressive Cardiac  Consultants:   Cardiology  Procedures:   None   Antimicrobials: Azithromycin   Subjective: Patient seen and examined.  Shortness of breath improved.  No pain complaints.  Objective: Vitals:   10/14/20 0500 10/14/20 0510 10/14/20 0758 10/14/20 0811  BP:  138/82 125/87   Pulse:  (!) 117 (!) 116   Resp:  (!) 21    Temp:  98 F (36.7 C) (!) 97.3 F (36.3 C)   TempSrc:   Oral   SpO2:  98% 96% 95%  Weight: 64.3 kg     Height:  Intake/Output Summary (Last 24 hours) at 10/14/2020 1050 Last data filed at 10/14/2020 0950 Gross per 24 hour  Intake 840 ml  Output 1200 ml  Net -360 ml   Filed Weights   10/12/20 1118 10/14/20 0500  Weight: 70 kg 64.3 kg    Examination:  General exam: Appears calm and comfortable  Respiratory system: Bibasilar crackles.  No  expiratory wheeze.  Normal work of breathing.  3 L Cardiovascular system: S1 & S2 heard, RRR. No JVD, murmurs, rubs, gallops or clicks. No pedal edema. Gastrointestinal system: Abdomen is nondistended, soft and nontender. No organomegaly or masses felt. Normal bowel sounds heard. Central nervous system: Alert and oriented. No focal neurological deficits. Extremities: Symmetric 5 x 5 power. Skin: No rashes, lesions or ulcers Psychiatry: Judgement and insight appear normal. Mood & affect appropriate.     Data Reviewed: I have personally reviewed following labs and imaging studies  CBC: Recent Labs  Lab 10/12/20 1112 10/13/20 0223 10/14/20 0751  WBC 11.3* 14.5* 12.1*  NEUTROABS  --  13.4* 11.2*  HGB 10.9* 10.4* 10.5*  HCT 33.4* 31.9* 31.8*  MCV 90.5 89.9 90.1  PLT 441* 358 400*   Basic Metabolic Panel: Recent Labs  Lab 10/12/20 1112 10/12/20 2115 10/13/20 0223 10/14/20 0751  NA 125* 127* 124* 130*  K 4.2  --  3.9 3.8  CL 89*  --  87* 89*  CO2 25  --  26 29  GLUCOSE 113*  --  139* 151*  BUN 16  --  17 26*  CREATININE 0.82  --  0.88 0.87  CALCIUM 8.6*  --  8.6* 9.0  MG  --   --   --  1.9   GFR: Estimated Creatinine Clearance: 55.2 mL/min (by C-G formula based on SCr of 0.87 mg/dL). Liver Function Tests: Recent Labs  Lab 10/12/20 1112 10/13/20 0223  AST 24 22  ALT 17 16  ALKPHOS 107 94  BILITOT 0.8 0.8  PROT 6.6 6.4*  ALBUMIN 3.2* 3.0*   No results for input(s): LIPASE, AMYLASE in the last 168 hours. No results for input(s): AMMONIA in the last 168 hours. Coagulation Profile: Recent Labs  Lab 10/13/20 0223  INR 1.0   Cardiac Enzymes: No results for input(s): CKTOTAL, CKMB, CKMBINDEX, TROPONINI in the last 168 hours. BNP (last 3 results) No results for input(s): PROBNP in the last 8760 hours. HbA1C: No results for input(s): HGBA1C in the last 72 hours. CBG: No results for input(s): GLUCAP in the last 168 hours. Lipid Profile: No results for input(s):  CHOL, HDL, LDLCALC, TRIG, CHOLHDL, LDLDIRECT in the last 72 hours. Thyroid Function Tests: No results for input(s): TSH, T4TOTAL, FREET4, T3FREE, THYROIDAB in the last 72 hours. Anemia Panel: No results for input(s): VITAMINB12, FOLATE, FERRITIN, TIBC, IRON, RETICCTPCT in the last 72 hours. Sepsis Labs: Recent Labs  Lab 10/13/20 0223 10/13/20 0603  LATICACIDVEN 0.9 0.9    Recent Results (from the past 240 hour(s))  Resp Panel by RT-PCR (Flu A&B, Covid) Nasopharyngeal Swab     Status: None   Collection Time: 10/12/20 11:21 AM   Specimen: Nasopharyngeal Swab; Nasopharyngeal(NP) swabs in vial transport medium  Result Value Ref Range Status   SARS Coronavirus 2 by RT PCR NEGATIVE NEGATIVE Final    Comment: (NOTE) SARS-CoV-2 target nucleic acids are NOT DETECTED.  The SARS-CoV-2 RNA is generally detectable in upper respiratory specimens during the acute phase of infection. The lowest concentration of SARS-CoV-2 viral copies this assay can detect is 138 copies/mL.  A negative result does not preclude SARS-Cov-2 infection and should not be used as the sole basis for treatment or other patient management decisions. A negative result may occur with  improper specimen collection/handling, submission of specimen other than nasopharyngeal swab, presence of viral mutation(s) within the areas targeted by this assay, and inadequate number of viral copies(<138 copies/mL). A negative result must be combined with clinical observations, patient history, and epidemiological information. The expected result is Negative.  Fact Sheet for Patients:  EntrepreneurPulse.com.au  Fact Sheet for Healthcare Providers:  IncredibleEmployment.be  This test is no t yet approved or cleared by the [place] FDA and  has been authorized for detection and/or diagnosis of SARS-CoV-2 by FDA under an Emergency Use Authorization (EUA). This EUA will remain  in effect (meaning  this test can be used) for the duration of the COVID-19 declaration under Section 564(b)(1) of the Act, 21 U.S.C.section 360bbb-3(b)(1), unless the authorization is terminated  or revoked sooner.       Influenza A by PCR NEGATIVE NEGATIVE Final   Influenza B by PCR NEGATIVE NEGATIVE Final    Comment: (NOTE) The Xpert Xpress SARS-CoV-2/FLU/RSV plus assay is intended as an aid in the diagnosis of influenza from Nasopharyngeal swab specimens and should not be used as a sole basis for treatment. Nasal washings and aspirates are unacceptable for Xpert Xpress SARS-CoV-2/FLU/RSV testing.  Fact Sheet for Patients: EntrepreneurPulse.com.au  Fact Sheet for Healthcare Providers: IncredibleEmployment.be  This test is not yet approved or cleared by the [place] FDA and has been authorized for detection and/or diagnosis of SARS-CoV-2 by FDA under an Emergency Use Authorization (EUA). This EUA will remain in effect (meaning this test can be used) for the duration of the COVID-19 declaration under Section 564(b)(1) of the Act, 21 U.S.C. section 360bbb-3(b)(1), unless the authorization is terminated or revoked.  Performed at [facility], [address] [place], [place] [address]   Culture, blood (x 2)     Status: None (Preliminary result)   Collection Time: 10/13/20  2:23 AM   Specimen: BLOOD  Result Value Ref Range Status   Specimen Description BLOOD RIGHT HAND  Final   Special Requests   Final    BOTTLES DRAWN AEROBIC AND ANAEROBIC Blood Culture results may not be optimal due to an inadequate volume of blood received in culture bottles   Culture   Final    NO GROWTH 1 DAY Performed at [facility], [address] [place], [place] [address]    Report Status PENDING  Incomplete  Culture, blood (x 2)     Status: None (Preliminary result)   Collection Time: 10/13/20  2:24 AM   Specimen: BLOOD  Result Value Ref Range Status    Specimen Description BLOOD RIGHT FA  Final   Special Requests   Final    BOTTLES DRAWN AEROBIC AND ANAEROBIC Blood Culture adequate volume   Culture   Final    NO GROWTH 1 DAY Performed at [facility], [address] [place],  [address]    Report Status PENDING  Incomplete         Radiology Studies: CT ANGIO CHEST PE W OR WO CONTRAST  Result Date: 10/12/2020 CLINICAL DATA:  Positive D-dimer and shortness of breath EXAM: CT ANGIOGRAPHY CHEST WITH CONTRAST TECHNIQUE: Multidetector CT imaging of the chest was performed using the standard protocol during bolus administration of intravenous contrast. Multiplanar CT image reconstructions and MIPs were obtained to evaluate the vascular anatomy. CONTRAST:  34mL OMNIPAQUE IOHEXOL 350 MG/ML SOLN COMPARISON:  Plain film from earlier in the same day, 09/07/2020 CT. FINDINGS: Cardiovascular: Thoracic aorta demonstrates atherosclerotic calcifications without aneurysmal dilatation or dissection. Coronary calcifications are noted. No significant cardiac enlargement is noted. The pulmonary artery is well visualized within normal branching pattern even on the right where significant post irradiation changes are noted. Mediastinum/Nodes: Thoracic inlet is within normal limits. The esophagus is unremarkable. No sizable hilar or mediastinal adenopathy is noted. Lungs/Pleura: Bilateral pleural effusions are noted right considerably greater than left. Post radiation changes are noted centrally in the right lung. The degree of consolidation is similar to that seen on prior plain film but progressed when compared with the prior CT examination dated 09/07/2020. Left lung demonstrates some patchy ground-glass airspace opacity likely postinflammatory in nature. Upper Abdomen: Stent graft is noted in the upper abdomen. No other focal abnormality is seen. Musculoskeletal: Degenerative changes of the thoracic spine are noted. No acute bony abnormality is noted.  Review of the MIP images confirms the above findings. IMPRESSION: Increasing consolidation in the right lung when compared with the prior exam representing a combination of previously seen post irradiation changes as well as acute pneumonic infiltrate. Large right-sided pleural effusion is noted. Small left pleural effusion with patchy airspace opacity consistent with multifocal pneumonia. No evidence of pulmonary emboli. Aortic Atherosclerosis (ICD10-I70.0). Electronically Signed   By: Inez Catalina M.D.   On: 10/12/2020 21:54   DG Chest Portable 1 View  Result Date: 10/12/2020 CLINICAL DATA:  Shortness of breath. Previous history of lung carcinoma and radiation therapy. EXAM: PORTABLE CHEST 1 VIEW COMPARISON:  09/06/2020 FINDINGS: Heart size and mediastinal contours are stable. Extensive pleural-parenchymal scarring and volume loss again seen in the right upper lobe. A new small right pleural effusion is seen, with atelectasis or infiltrate at the right lung base. Increased diffuse interstitial infiltrates are also seen compared to previous study with Kerley B-lines, suspicious for diffuse interstitial edema. Left-sided Port-A-Cath remains in place. IMPRESSION: New small right pleural effusion, and right basilar atelectasis versus infiltrate. Increased diffuse interstitial infiltrates, suspicious for pulmonary interstitial edema. Stable post radiation changes in right upper hemithorax. Electronically Signed   By: Marlaine Hind M.D.   On: 10/12/2020 11:54   ECHOCARDIOGRAM COMPLETE  Result Date: 10/13/2020    ECHOCARDIOGRAM REPORT   Patient Name:   LANORE RENDEROS Date of Exam: 10/13/2020 Medical Rec #:  [id number]         Height:       67.0 in Accession #:    [id number]        Weight:       154.4 lb Date of Birth:  Aug 18, 1945         BSA:          1.812 m Patient Age:    20 years          BP:           127/83 mmHg Patient Gender: F                 HR:           131 bpm. Exam Location:  [facility] Procedure: 2D  Echo, Cardiac Doppler and Color Doppler Indications:     Acute respiratory distress R06.03  History:         Patient has no prior history of Echocardiogram examinations.                  COPD. Personal history  of chemotherapy, tobacco abuse.  Sonographer:     Sherrie Sport RDCS (AE) Referring Phys:  [id number] [place] Diagnosing Phys: Kate Sable MD  Sonographer Comments: Image acquisition challenging due to mastectomy and Image acquisition challenging due to COPD. IMPRESSIONS  1. Left ventricular ejection fraction, by estimation, is 25 to 30%. Left ventricular ejection fraction by 2D MOD biplane is 29.6 %. The left ventricle has severely decreased function. The left ventricle demonstrates global hypokinesis. The left ventricular internal cavity size was mildly to moderately dilated. Left ventricular diastolic parameters are consistent with Grade II diastolic dysfunction (pseudonormalization).  2. Right ventricular systolic function is moderately reduced. The right ventricular size is normal.  3. Left atrial size was moderately dilated.  4. The mitral valve is degenerative. Mild to moderate mitral valve regurgitation. Severe mitral annular calcification.  5. The aortic valve is calcified. Aortic valve regurgitation is not visualized. Moderate aortic valve stenosis. FINDINGS  Left Ventricle: Left ventricular ejection fraction, by estimation, is 25 to 30%. Left ventricular ejection fraction by 2D MOD biplane is 29.6 %. The left ventricle has severely decreased function. The left ventricle demonstrates global hypokinesis. The left ventricular internal cavity size was mildly to moderately dilated. There is no left ventricular hypertrophy. Left ventricular diastolic parameters are consistent with Grade II diastolic dysfunction (pseudonormalization). Right Ventricle: The right ventricular size is normal. No increase in right ventricular wall thickness. Right ventricular systolic function is moderately reduced.  Left Atrium: Left atrial size was moderately dilated. Right Atrium: Right atrial size was normal in size. Pericardium: There is no evidence of pericardial effusion. Mitral Valve: The mitral valve is degenerative in appearance. There is moderate calcification of the mitral valve leaflet(s). Severe mitral annular calcification. Mild to moderate mitral valve regurgitation. MV peak gradient, 5.2 mmHg. The mean mitral valve gradient is 2.0 mmHg. Tricuspid Valve: The tricuspid valve is not well visualized. Tricuspid valve regurgitation is not demonstrated. Aortic Valve: The aortic valve is calcified. Aortic valve regurgitation is not visualized. Moderate aortic stenosis is present. Aortic valve mean gradient measures 7.3 mmHg. Aortic valve peak gradient measures 13.2 mmHg. Aortic valve area, by VTI measures 1.01 cm. Pulmonic Valve: The pulmonic valve was not well visualized. Pulmonic valve regurgitation is not visualized. Aorta: The aortic root is normal in size and structure. Venous: The inferior vena cava was not well visualized. IAS/Shunts: No atrial level shunt detected by color flow Doppler.  LEFT VENTRICLE PLAX 2D                        Biplane EF (MOD) LVIDd:         5.53 cm         LV Biplane EF:   Left LVIDs:         4.81 cm                          ventricular LV PW:         0.99 cm                          ejection LV IVS:        0.66 cm                          fraction by LVOT diam:     2.10 cm  2D MOD LV SV:         29                               biplane is LV SV Index:   16                               29.6 %. LVOT Area:     3.46 cm                                Diastology                                LV e' medial:    4.46 cm/s LV Volumes (MOD)               LV E/e' medial:  28.5 LV vol d, MOD    101.0 ml      LV e' lateral:   3.05 cm/s A2C:                           LV E/e' lateral: 41.6 LV vol d, MOD    95.9 ml A4C: LV vol s, MOD    69.3 ml A2C: LV vol s, MOD    64.1 ml  A4C: LV SV MOD A2C:   31.7 ml LV SV MOD A4C:   95.9 ml LV SV MOD BP:    29.8 ml RIGHT VENTRICLE RV Basal diam:  2.66 cm RV S prime:     10.40 cm/s TAPSE (M-mode): 2.7 cm LEFT ATRIUM             Index       RIGHT ATRIUM           Index LA diam:        3.40 cm 1.88 cm/m  RA Area:     10.40 cm LA Vol (A2C):   85.9 ml 47.42 ml/m RA Volume:   22.00 ml  12.14 ml/m LA Vol (A4C):   28.4 ml 15.68 ml/m LA Biplane Vol: 54.8 ml 30.25 ml/m  AORTIC VALVE                    PULMONIC VALVE AV Area (Vmax):    1.04 cm     PV Vmax:        0.54 m/s AV Area (Vmean):   1.00 cm     PV Peak grad:   1.1 mmHg AV Area (VTI):     1.01 cm     RVOT Peak grad: 2 mmHg AV Vmax:           181.67 cm/s AV Vmean:          124.333 cm/s AV VTI:            0.283 m AV Peak Grad:      13.2 mmHg AV Mean Grad:      7.3 mmHg LVOT Vmax:         54.80 cm/s LVOT Vmean:        36.000 cm/s LVOT VTI:          0.082 m LVOT/AV VTI ratio: 0.29  AORTA Ao Root diam: 2.50 cm MITRAL VALVE  TRICUSPID VALVE MV Area (PHT): 6.65 cm     TR Peak grad:   25.6 mmHg MV Area VTI:   1.18 cm     TR Vmax:        253.00 cm/s MV Peak grad:  5.2 mmHg MV Mean grad:  2.0 mmHg     SHUNTS MV Vmax:       1.14 m/s     Systemic VTI:  0.08 m MV Vmean:      67.9 cm/s    Systemic Diam: 2.10 cm MV Decel Time: 114 msec MV E velocity: 127.00 cm/s MV A velocity: 97.70 cm/s MV E/A ratio:  1.30 Kate Sable MD Electronically signed by Kate Sable MD Signature Date/Time: 10/13/2020/5:18:55 PM    Final         Scheduled Meds: . arformoterol  15 mcg Nebulization BID  . aspirin EC  81 mg Oral Daily  . atorvastatin  10 mg Oral QHS  . azithromycin  500 mg Oral Daily  . budesonide (PULMICORT) nebulizer solution  0.25 mg Nebulization BID  . enoxaparin (LOVENOX) injection  40 mg Subcutaneous Q24H  . feeding supplement  237 mL Oral BID BM  . furosemide  40 mg Intravenous Q12H  . ipratropium-albuterol  3 mL Nebulization Q4H  . mouth rinse  15 mL Mouth Rinse BID   . methylPREDNISolone (SOLU-MEDROL) injection  40 mg Intravenous Q8H  . multivitamin with minerals  1 tablet Oral Daily  . nicotine  14 mg Transdermal Daily  . pantoprazole  40 mg Oral Daily  . potassium chloride  40 mEq Oral Once  . sodium chloride flush  3 mL Intravenous Q12H  . vitamin B-12  1,000 mcg Oral Daily   Continuous Infusions: . sodium chloride    . magnesium sulfate bolus IVPB       LOS: 2 days    Time spent: 25 minutes    Sidney Ace, MD [facility] Pager [phone]  If 7PM-7AM, please contact night-coverage 10/14/2020, 10:50 AM

## 2020-10-14 NOTE — Progress Notes (Signed)
   10/13/20 2114  Assess: MEWS Score  Temp 97.7 F (36.5 C)  BP 118/64  Pulse Rate (!) 113  Resp (!) 21  Level of Consciousness Alert  SpO2 97 %  O2 Device Nasal Cannula  O2 Flow Rate (L/min) 4 L/min  Assess: MEWS Score  MEWS Temp 0  MEWS Systolic 0  MEWS Pulse 2  MEWS RR 1  MEWS LOC 0  MEWS Score 3  MEWS Score Color Yellow  Assess: if the MEWS score is Yellow or Red  Were vital signs taken at a resting state? Yes  Focused Assessment No change from prior assessment  Early Detection of Sepsis Score *See Row Information* Low  MEWS guidelines implemented *See Row Information* No, previously yellow, continue vital signs every 4 hours

## 2020-10-14 NOTE — ACP (Advance Care Planning) (Signed)
PT has a AD as of 10/14/20 .  Contact: Lesli Albee [phone]

## 2020-10-14 NOTE — Progress Notes (Signed)
   10/14/20 0510  Assess: MEWS Score  Temp 98 F (36.7 C)  BP 138/82  Pulse Rate (!) 117  Resp (!) 21  Level of Consciousness Alert  SpO2 98 %  O2 Device Nasal Cannula  O2 Flow Rate (L/min) 4 L/min  Assess: MEWS Score  MEWS Temp 0  MEWS Systolic 0  MEWS Pulse 2  MEWS RR 1  MEWS LOC 0  MEWS Score 3  MEWS Score Color Yellow  Assess: if the MEWS score is Yellow or Red  Were vital signs taken at a resting state? Yes  Focused Assessment No change from prior assessment  Early Detection of Sepsis Score *See Row Information* Low  MEWS guidelines implemented *See Row Information* No, previously yellow, continue vital signs every 4 hours

## 2020-10-15 ENCOUNTER — Inpatient Hospital Stay: Payer: Medicare HMO

## 2020-10-15 DIAGNOSIS — I255 Ischemic cardiomyopathy: Secondary | ICD-10-CM

## 2020-10-15 DIAGNOSIS — R0602 Shortness of breath: Secondary | ICD-10-CM

## 2020-10-15 DIAGNOSIS — J90 Pleural effusion, not elsewhere classified: Secondary | ICD-10-CM

## 2020-10-15 LAB — CBC WITH DIFFERENTIAL/PLATELET
Abs Immature Granulocytes: 0.12 10*3/uL — ABNORMAL HIGH (ref 0.00–0.07)
Basophils Absolute: 0 10*3/uL (ref 0.0–0.1)
Basophils Relative: 0 %
Eosinophils Absolute: 0 10*3/uL (ref 0.0–0.5)
Eosinophils Relative: 0 %
HCT: 32.5 % — ABNORMAL LOW (ref 36.0–46.0)
Hemoglobin: 10.5 g/dL — ABNORMAL LOW (ref 12.0–15.0)
Immature Granulocytes: 1 %
Lymphocytes Relative: 3 %
Lymphs Abs: 0.4 10*3/uL — ABNORMAL LOW (ref 0.7–4.0)
MCH: 29.3 pg (ref 26.0–34.0)
MCHC: 32.3 g/dL (ref 30.0–36.0)
MCV: 90.8 fL (ref 80.0–100.0)
Monocytes Absolute: 0.5 10*3/uL (ref 0.1–1.0)
Monocytes Relative: 4 %
Neutro Abs: 13 10*3/uL — ABNORMAL HIGH (ref 1.7–7.7)
Neutrophils Relative %: 92 %
Platelets: 412 10*3/uL — ABNORMAL HIGH (ref 150–400)
RBC: 3.58 MIL/uL — ABNORMAL LOW (ref 3.87–5.11)
RDW: 15 % (ref 11.5–15.5)
WBC: 14 10*3/uL — ABNORMAL HIGH (ref 4.0–10.5)
nRBC: 0 % (ref 0.0–0.2)

## 2020-10-15 LAB — BASIC METABOLIC PANEL
Anion gap: 12 (ref 5–15)
BUN: 42 mg/dL — ABNORMAL HIGH (ref 8–23)
CO2: 34 mmol/L — ABNORMAL HIGH (ref 22–32)
Calcium: 8.9 mg/dL (ref 8.9–10.3)
Chloride: 86 mmol/L — ABNORMAL LOW (ref 98–111)
Creatinine, Ser: 1.24 mg/dL — ABNORMAL HIGH (ref 0.44–1.00)
GFR, Estimated: 46 mL/min — ABNORMAL LOW (ref >60)
Glucose, Bld: 142 mg/dL — ABNORMAL HIGH (ref 70–99)
Potassium: 3.7 mmol/L (ref 3.5–5.1)
Sodium: 132 mmol/L — ABNORMAL LOW (ref 135–145)

## 2020-10-15 LAB — MAGNESIUM: Magnesium: 2.3 mg/dL (ref 1.7–2.4)

## 2020-10-15 MED ORDER — BISACODYL 10 MG RE SUPP
10.0000 mg | Freq: Every day | RECTAL | Status: DC | PRN
Start: 2020-10-15 — End: 2020-10-17

## 2020-10-15 MED ORDER — FUROSEMIDE 10 MG/ML IJ SOLN
40.0000 mg | Freq: Every day | INTRAMUSCULAR | Status: DC
Start: 2020-10-15 — End: 2020-10-16
  Administered 2020-10-15: 40 mg via INTRAVENOUS
  Filled 2020-10-15: qty 4

## 2020-10-15 MED ORDER — METHYLPREDNISOLONE SODIUM SUCC 40 MG IJ SOLR
40.0000 mg | Freq: Two times a day (BID) | INTRAMUSCULAR | Status: DC
  Administered 2020-10-15 – 2020-10-16 (×2): 40 mg via INTRAVENOUS
  Filled 2020-10-15 (×2): qty 1

## 2020-10-15 MED ORDER — POLYETHYLENE GLYCOL 3350 17 G PO PACK
17.0000 g | PACK | Freq: Every day | ORAL | Status: DC
  Administered 2020-10-15 – 2020-10-17 (×2): 17 g via ORAL
  Filled 2020-10-15 (×2): qty 1

## 2020-10-15 MED ORDER — SENNOSIDES-DOCUSATE SODIUM 8.6-50 MG PO TABS
1.0000 | ORAL_TABLET | Freq: Two times a day (BID) | ORAL | Status: DC
  Administered 2020-10-15 – 2020-10-17 (×3): 1 via ORAL
  Filled 2020-10-15 (×4): qty 1

## 2020-10-15 NOTE — Progress Notes (Signed)
Occupational Therapy Treatment Patient Details Name: Roberta [name] MRN: [id number] DOB: 06/19/1945 Today's Date: 10/15/2020    History of present illness Roberta [name] is a 4yoF who comes to [facility] on 5/17 c SOB. PMH: LungCA s/p lobectomy, COPD (not on O2), tobacco dependence, BrCA s/p mastectomy, AAA s/p endovascular repair, PVD, B12 deficiency, vertebroplasties (T6, T4, L4). Pt admitted in COPD exacerbation.   OT comments  [name] was seen for OT treatment on this date. Upon arrival to room pt sleeping in bed, but wakes easily to VCs. She is agreeable to OT tx session, but as session progresses declines OOB/EOB activity due to fatigue from previous PT session. Pt/provider discuss energy conservations strategies. Pt educated on energy conservation strategies to support safety and functional independence during ADL management. Topics reviewed included pursed lip breathing, activity pacing, and prioritizing meaningful occupations. Handout provided to support recall/carryover of edu provided. Pt declines OOB functional activity this date 2/2 fatigue. Pt return demonstrates understanding of PLB task. During session pt SpO2 noted to be 87% with pt at rest on RA. RN notified. Per RN, attempting to wean pt to RA as this is her baseline. Educated pt on pursed lip breathing. Pt engages in PLB activity with therapist. SpO2 rebounds to 91% and remains for rest of sessionPt continues to benefit from skilled OT services to maximize return to PLOF and minimize risk of future falls, injury, caregiver burden, and readmission. Will continue to follow POC. Discharge recommendation remains appropriate.    Follow Up Recommendations  Home health OT;Supervision - Intermittent    Equipment Recommendations  None recommended by OT    Recommendations for Other Services      Precautions / Restrictions Precautions Precautions: Fall Precaution Comments: Monitor HR 02 Restrictions Weight Bearing Restrictions:  No       Mobility Bed Mobility Overal bed mobility: Modified Independent Bed Mobility: Supine to Sit     Supine to sit: Modified independent (Device/Increase time)     General bed mobility comments: Deferred.    Transfers Overall transfer level: Needs assistance Equipment used: Rolling walker (2 wheeled) Transfers: Sit to/from [facility] Sit to Stand: Min guard         General transfer comment: Deferred for pt comfort/safety.    Balance Overall balance assessment: Needs assistance Sitting-balance support: Feet supported;Bilateral upper extremity supported Sitting balance-Leahy Scale: Good     Standing balance support: Bilateral upper extremity supported Standing balance-Leahy Scale: Fair                             ADL either performed or assessed with clinical judgement   ADL Overall ADL's : Needs assistance/impaired                                       General ADL Comments: Pt educated on energy conservation strategies to support safety and functional independence during ADL management. Topics reviewed included pursed lip breathing, activity pacing, and prioritizing meaningful occupations. Handout provided to support recall/carryover of edu provided. Pt declines OOB functional activity this date 2/2 fatigue.     Vision Patient Visual Report: No change from baseline     Perception     Praxis      Cognition Arousal/Alertness: Awake/alert Behavior During Therapy: WFL for tasks assessed/performed Overall Cognitive Status: Within Functional Limits for tasks assessed  Exercises Exercises: Other exercises Other Exercises Other Exercises: Pt educated on energy conservation strategies with emphasis on ADL management, pursed lip breathing, activity pacing, and prioritizing meaningful occupations. Other Exercises: supine/seated ther-ex performed including SLR,  hip abd/add, and alt marching. 10 reps performed with cga   Shoulder Instructions       General Comments SpO2 noted to be 87% with pt at rest on RA. RN contacted and notified. Per RN, attempting to wean pt to RA as this is her baseline. Educated pt on pursed lip breathing. Pt engages in PLB activity with therapist. SpO2 rebounds to 91% and remains for rest of session.    Pertinent Vitals/ Pain       Pain Assessment: No/denies pain  Home Living                                          Prior Functioning/Environment              Frequency  Min 1X/week        Progress Toward Goals  OT Goals(current goals can now be found in the care plan section)  Progress towards OT goals: Progressing toward goals  Acute Rehab OT Goals Patient Stated Goal: return to home and regain baselin independence OT Goal Formulation: With patient Time For Goal Achievement: 10/27/20 Potential to Achieve Goals: Good  Plan Discharge plan remains appropriate;Frequency remains appropriate    Co-evaluation                 AM-PAC OT "6 Clicks" Daily Activity     Outcome Measure   Help from another person eating meals?: None Help from another person taking care of personal grooming?: A Little Help from another person toileting, which includes using toliet, bedpan, or urinal?: A Little Help from another person bathing (including washing, rinsing, drying)?: A Little Help from another person to put on and taking off regular upper body clothing?: A Little Help from another person to put on and taking off regular lower body clothing?: A Little 6 Click Score: 19    End of Session    OT Visit Diagnosis: Other abnormalities of gait and mobility (R26.89);Muscle weakness (generalized) (M62.81)   Activity Tolerance Patient tolerated treatment well   Patient Left with call bell/phone within reach;in bed   Nurse Communication Other (comment) (O2 sats.)        Time:  3343-5686 OT Time Calculation (min): 10 min  Charges: OT General Charges $OT Visit: 1 Visit OT Treatments $Therapeutic Activity: 8-22 mins  Shara Blazing, M.S., OTR/L Ascom: [phone] 10/15/20, 4:18 PM

## 2020-10-15 NOTE — Consult Note (Addendum)
Cardiology Consultation:   Patient ID: Roberta [name] MRN: [id number]; DOB: [birth year]  Admit date: 10/12/2020 Date of Consult: 10/15/2020  PCP:  Gayland Curry, MD   [place] Providers Cardiologist:  New   Patient Profile:   Roberta [name] is a 75 y.o. 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 She had a Myoview stress test that showed normal LV function, low risk, with no ischemia  No prior history of heart attack, stent, arrhythmia, heart failure prior to admission.  Patient underwent aortic aneurysm stent graft placement in 2018 for AAA with right renal artery stent placement.  Patient had vascular ultrasound of the aorta 10/2017 showing abnormal dilation of the distal aorta, largest aortic measurement 4.7 cm, no endoleak.  Patient had bilateral ABIs in 10/2017 as well showing right and left ABIs within normal range.  Patient presented to [facility] ED 10/12/2020 for worsening shortness of breath. Said she had been having SOB for about a week but became severe overnight. Breathing was worse with exertion. Also had orthopnea for a week and some pnd. No LLE. Denies chest pain, palpitations, nausea, vomiting.   In the ED patient was placed on 2 L O2, and later increased to 4 L O2.  Labs showed sodium 125, potassium 4.2, glucose 113, creatinine 0.82, BUN 16, albumin 3.2, BNP 1331, WBC 11.3, hemoglobin 10.9.  Lactic acid normal. HS troponin 52>52.blood cultures were collected.  D-dimer found to be elevated at 1. COVID-negative CT chest showed no PE, small left pleural effusion, possible multifocal pneumonia, large right-sided  pleural effusion.  Chest x-ray showed new small right pleural effusion, right basilar atelectasis versus infiltrate, increased diffuse interstitial infiltrates possible pulmonary edema.  EKG showed sinus tach at 117 bpm, LVH with possible repull abnormality, no significant ST/T wave changes.  She was given duo nebs, Solu-Medrol, IV Lasix and admitted for further work-up.  Echo showed decreased EF of 25 to 30% cardiology was consulted.  She lives with her daughter and uses a cane or walker for ambulation. She is a lifelong smoker (1ppd). No alcohol or drug use. Family history with CHF in her father. She is wanting to go home. Has a neurology appointment on Monday.  Past Medical History:  Diagnosis Date  . AAA (abdominal aortic aneurysm) ([place])   . Anemia   . Breast cancer ([place]) 2002   left  . COPD (chronic obstructive pulmonary disease) ([place])   . Dyspnea   . Headache   . High cholesterol   . Personal history of chemotherapy   . Small cell lung cancer ([place])   . Tobacco abuse     Past Surgical History:  Procedure Laterality Date  . ABDOMINAL HYSTERECTOMY    . ARTERY BIOPSY Right 12/05/2017   Procedure: BIOPSY TEMPORAL ARTERY;  Surgeon: Margaretha Sheffield, MD;  Location: [facility] ORS;  Service: ENT;  Laterality: Right;  . ENDOBRONCHIAL ULTRASOUND N/A 02/19/2017   Procedure: ENDOBRONCHIAL ULTRASOUND;  Surgeon: Flora Lipps, MD;  Location: [facility] ORS;  Service: Cardiopulmonary;  Laterality: N/A;  . ENDOVASCULAR REPAIR/STENT GRAFT N/A 12/06/2016   Procedure: Endovascular Repair/Stent Graft;  Surgeon: Katha Cabal, MD;  Location[facility] INVASIVE  CV LAB;  Service: Cardiovascular;  Laterality: N/A;  . IR FLUORO GUIDE PORT INSERTION RIGHT  03/02/2017  . KYPHOPLASTY N/A 06/11/2018   Procedure: KYPHOPLASTY L4 BIOPSY WITH RFA;  Surgeon: Hessie Knows, MD;  Location: [facility] ORS;  Service: Orthopedics;  Laterality: N/A;  . KYPHOPLASTY N/A 12/16/2019   Procedure: T6 KYPHOPLASTY;  Surgeon: Hessie Knows, MD;   Location: [facility] ORS;  Service: Orthopedics;  Laterality: N/A;  . MASTECTOMY Left 2002   with sentinel node      Home Medications:  Prior to Admission medications   Medication Sig Start Date End Date Taking? Authorizing Provider  acetaminophen (TYLENOL) 325 MG tablet Take 650 mg by mouth every 6 (six) hours as needed for mild pain.    Yes [provider]  albuterol (PROVENTIL HFA;VENTOLIN HFA) 108 (90 Base) MCG/ACT inhaler Inhale 2 puffs into the lungs every 6 (six) hours as needed for wheezing or shortness of breath. 03/23/16  Yes Frederich Cha, MD  aspirin EC 81 MG EC tablet Take 1 tablet (81 mg total) by mouth daily. Swallow whole. 09/09/20  Yes Bonnielee Haff, MD  atorvastatin (LIPITOR) 10 MG tablet Take 10 mg by mouth at bedtime.  08/11/16  Yes [provider]  fluticasone-salmeterol (ADVAIR HFA) 115-21 MCG/ACT inhaler Inhale 2 puffs into the lungs 2 (two) times daily.   Yes [provider]  ipratropium (ATROVENT) 0.02 % nebulizer solution Take by nebulization. 10/07/20  Yes [provider]  omeprazole (PRILOSEC) 20 MG capsule Take 1 capsule (20 mg total) by mouth daily. 09/27/20  Yes Jacquelin Hawking, NP  vitamin B-12 (CYANOCOBALAMIN) 1000 MCG tablet Take 1 tablet (1,000 mcg total) by mouth daily. 09/09/20  Yes Bonnielee Haff, MD    Inpatient Medications: Scheduled Meds: . arformoterol  15 mcg Nebulization BID  . aspirin EC  81 mg Oral Daily  . atorvastatin  10 mg Oral QHS  . azithromycin  500 mg Oral Daily  . budesonide (PULMICORT) nebulizer solution  0.25 mg Nebulization BID  . Chlorhexidine Gluconate Cloth  6 each Topical Daily  . enoxaparin (LOVENOX) injection  40 mg Subcutaneous Q24H  . feeding supplement  237 mL Oral BID BM  . furosemide  40 mg Intravenous Daily  . ipratropium-albuterol  3 mL Nebulization TID  . mouth rinse  15 mL Mouth Rinse BID  . methylPREDNISolone (SOLU-MEDROL) injection  40 mg Intravenous Q12H  . multivitamin with  minerals  1 tablet Oral Daily  . nicotine  14 mg Transdermal Daily  . pantoprazole  40 mg Oral Daily  . sodium chloride flush  3 mL Intravenous Q12H  . vitamin B-12  1,000 mcg Oral Daily   Continuous Infusions: . sodium chloride     PRN Meds: sodium chloride, acetaminophen, ipratropium-albuterol, ondansetron (ZOFRAN) IV, sodium chloride flush, zolpidem  Allergies:   No Known Allergies  Social History:   Social History   Socioeconomic History  . Marital status: Widowed    Spouse name: Not on file  . Number of children: Not on file  . Years of education: Not on file  . Highest education level: Not on file  Occupational History  . Not on file  Tobacco Use  . Smoking status: Current Every Day Smoker    Packs/day: 1.50    Years: 50.00    Pack years: 75.00    Types: Cigarettes  . Smokeless tobacco: Never Used  Vaping Use  . Vaping Use: Never used  Substance and Sexual Activity  . Alcohol use: No  .  Drug use: No  . Sexual activity: Never  Other Topics Concern  . Not on file  Social History Narrative  . Not on file   Social Determinants of Health   Financial Resource Strain: Not on file  Food Insecurity: Not on file  Transportation Needs: Not on file  Physical Activity: Not on file  Stress: Not on file  Social Connections: Not on file  Intimate Partner Violence: Not on file    Family History:    Family History  Problem Relation Age of Onset  . CAD Mother   . Colon cancer Brother   . Breast cancer Neg Hx      ROS:  Please see the history of present illness.  All other ROS reviewed and negative.     Physical Exam/Data:   Vitals:   10/14/20 1950 10/14/20 2028 10/15/20 0409 10/15/20 0716  BP:  (!) 99/54 105/62   Pulse:  65 (!) 102   Resp:  20 18   Temp:  97.9 F (36.6 C) 97.7 F (36.5 C)   TempSrc:      SpO2: 94% (!) 84% 96% 91%  Weight:   63.5 kg   Height:        Intake/Output Summary (Last 24 hours) at 10/15/2020 0740 Last data filed at  10/15/2020 0600 Gross per 24 hour  Intake 1080 ml  Output 1050 ml  Net 30 ml   Last 3 Weights 10/15/2020 10/14/2020 10/12/2020  Weight (lbs) 140 lb 1.6 oz 141 lb 11.2 oz 154 lb 6.4 oz  Weight (kg) 63.549 kg 64.275 kg 70.035 kg     Body mass index is 21.94 kg/m.  General:  Well nourished, well developed, in no acute distress HEENT: normal Lymph: no adenopathy Neck: no JVD Endocrine:  No thryomegaly Vascular: No carotid bruits; FA pulses 2+ bilaterally without bruits  Cardiac:  normal S1, S2; RRR; no murmur  Lungs: diffusely diminished Abd: soft, nontender, no hepatomegaly  Ext: no edema Musculoskeletal:  No deformities, BUE and BLE strength normal and equal Skin: warm and dry  Neuro:  CNs 2-12 intact, no focal abnormalities noted Psych:  Normal affect   EKG:  The EKG was personally reviewed and demonstrates:  S, poor baselin, LVH with repol abnormalities, nonspecific T wave changes Telemetry:  Telemetry was personally reviewed and demonstrates:  ST/SR, HR 90-105, PACs/PVCs  Relevant CV Studies:  Echo 10/13/20 1. Left ventricular ejection fraction, by estimation, is 25 to 30%. Left  ventricular ejection fraction by 2D MOD biplane is 29.6 %. The left  ventricle has severely decreased function. The left ventricle demonstrates  global hypokinesis. The left  ventricular internal cavity size was mildly to moderately dilated. Left  ventricular diastolic parameters are consistent with Grade II diastolic  dysfunction (pseudonormalization).  2. Right ventricular systolic function is moderately reduced. The right  ventricular size is normal.  3. Left atrial size was moderately dilated.  4. The mitral valve is degenerative. Mild to moderate mitral valve  regurgitation. Severe mitral annular calcification.  5. The aortic valve is calcified. Aortic valve regurgitation is not  visualized. Moderate aortic valve stenosis.   Myoview stress test 2018 Negative lexiscan stress, LV function  normal, No reversible ischemia  Laboratory Data:  High Sensitivity Troponin:   Recent Labs  Lab 10/12/20 1112 10/12/20 1318  TROPONINIHS 52* 52*     Chemistry Recent Labs  Lab 10/13/20 0223 10/14/20 0751 10/15/20 0356  NA 124* 130* 132*  K 3.9 3.8 3.7  CL 87* 89*  86*  CO2 26 29 34*  GLUCOSE 139* 151* 142*  BUN 17 26* 42*  CREATININE 0.88 0.87 1.24*  CALCIUM 8.6* 9.0 8.9  GFRNONAA >60 >60 46*  ANIONGAP 11 12 12     Recent Labs  Lab 10/12/20 1112 10/13/20 0223  PROT 6.6 6.4*  ALBUMIN 3.2* 3.0*  AST 24 22  ALT 17 16  ALKPHOS 107 94  BILITOT 0.8 0.8   Hematology Recent Labs  Lab 10/13/20 0223 10/14/20 0751 10/15/20 0356  WBC 14.5* 12.1* 14.0*  RBC 3.55* 3.53* 3.58*  HGB 10.4* 10.5* 10.5*  HCT 31.9* 31.8* 32.5*  MCV 89.9 90.1 90.8  MCH 29.3 29.7 29.3  MCHC 32.6 33.0 32.3  RDW 14.8 14.9 15.0  PLT 358 410* 412*   BNP Recent Labs  Lab 10/12/20 1112  BNP 1,331.8*    DDimer  Recent Labs  Lab 10/12/20 1121  DDIMER 1.00*     Radiology/Studies:  CT ANGIO CHEST PE W OR WO CONTRAST  Result Date: 10/12/2020 CLINICAL DATA:  Positive D-dimer and shortness of breath EXAM: CT ANGIOGRAPHY CHEST WITH CONTRAST TECHNIQUE: Multidetector CT imaging of the chest was performed using the standard protocol during bolus administration of intravenous contrast. Multiplanar CT image reconstructions and MIPs were obtained to evaluate the vascular anatomy. CONTRAST:  38mL OMNIPAQUE IOHEXOL 350 MG/ML SOLN COMPARISON:  Plain film from earlier in the same day, 09/07/2020 CT. FINDINGS: Cardiovascular: Thoracic aorta demonstrates atherosclerotic calcifications without aneurysmal dilatation or dissection. Coronary calcifications are noted. No significant cardiac enlargement is noted. The pulmonary artery is well visualized within normal branching pattern even on the right where significant post irradiation changes are noted. Mediastinum/Nodes: Thoracic inlet is within normal limits. The  esophagus is unremarkable. No sizable hilar or mediastinal adenopathy is noted. Lungs/Pleura: Bilateral pleural effusions are noted right considerably greater than left. Post radiation changes are noted centrally in the right lung. The degree of consolidation is similar to that seen on prior plain film but progressed when compared with the prior CT examination dated 09/07/2020. Left lung demonstrates some patchy ground-glass airspace opacity likely postinflammatory in nature. Upper Abdomen: Stent graft is noted in the upper abdomen. No other focal abnormality is seen. Musculoskeletal: Degenerative changes of the thoracic spine are noted. No acute bony abnormality is noted. Review of the MIP images confirms the above findings. IMPRESSION: Increasing consolidation in the right lung when compared with the prior exam representing a combination of previously seen post irradiation changes as well as acute pneumonic infiltrate. Large right-sided pleural effusion is noted. Small left pleural effusion with patchy airspace opacity consistent with multifocal pneumonia. No evidence of pulmonary emboli. Aortic Atherosclerosis (ICD10-I70.0). Electronically Signed   By: Inez Catalina M.D.   On: 10/12/2020 21:54   DG Chest Portable 1 View  Result Date: 10/12/2020 CLINICAL DATA:  Shortness of breath. Previous history of lung carcinoma and radiation therapy. EXAM: PORTABLE CHEST 1 VIEW COMPARISON:  09/06/2020 FINDINGS: Heart size and mediastinal contours are stable. Extensive pleural-parenchymal scarring and volume loss again seen in the right upper lobe. A new small right pleural effusion is seen, with atelectasis or infiltrate at the right lung base. Increased diffuse interstitial infiltrates are also seen compared to previous study with Kerley B-lines, suspicious for diffuse interstitial edema. Left-sided Port-A-Cath remains in place. IMPRESSION: New small right pleural effusion, and right basilar atelectasis versus infiltrate.  Increased diffuse interstitial infiltrates, suspicious for pulmonary interstitial edema. Stable post radiation changes in right upper hemithorax. Electronically Signed   By: Linus Mako  Kris Hartmann M.D.   On: 10/12/2020 11:54   ECHOCARDIOGRAM COMPLETE  Result Date: 10/13/2020    ECHOCARDIOGRAM REPORT   Patient Name:   JOANELL CRESSLER Date of Exam: 10/13/2020 Medical Rec #:  [id number]         Height:       67.0 in Accession #:    [id number]        Weight:       154.4 lb Date of Birth:  1946-01-30         BSA:          1.812 m Patient Age:    43 years          BP:           127/83 mmHg Patient Gender: F                 HR:           131 bpm. Exam Location:  [facility] Procedure: 2D Echo, Cardiac Doppler and Color Doppler Indications:     Acute respiratory distress R06.03  History:         Patient has no prior history of Echocardiogram examinations.                  COPD. Personal history of chemotherapy, tobacco abuse.  Sonographer:     Sherrie Sport RDCS (AE) Referring Phys:  [id number] [place] Diagnosing Phys: Kate Sable MD  Sonographer Comments: Image acquisition challenging due to mastectomy and Image acquisition challenging due to COPD. IMPRESSIONS  1. Left ventricular ejection fraction, by estimation, is 25 to 30%. Left ventricular ejection fraction by 2D MOD biplane is 29.6 %. The left ventricle has severely decreased function. The left ventricle demonstrates global hypokinesis. The left ventricular internal cavity size was mildly to moderately dilated. Left ventricular diastolic parameters are consistent with Grade II diastolic dysfunction (pseudonormalization).  2. Right ventricular systolic function is moderately reduced. The right ventricular size is normal.  3. Left atrial size was moderately dilated.  4. The mitral valve is degenerative. Mild to moderate mitral valve regurgitation. Severe mitral annular calcification.  5. The aortic valve is calcified. Aortic valve regurgitation is not visualized.  Moderate aortic valve stenosis. FINDINGS  Left Ventricle: Left ventricular ejection fraction, by estimation, is 25 to 30%. Left ventricular ejection fraction by 2D MOD biplane is 29.6 %. The left ventricle has severely decreased function. The left ventricle demonstrates global hypokinesis. The left ventricular internal cavity size was mildly to moderately dilated. There is no left ventricular hypertrophy. Left ventricular diastolic parameters are consistent with Grade II diastolic dysfunction (pseudonormalization). Right Ventricle: The right ventricular size is normal. No increase in right ventricular wall thickness. Right ventricular systolic function is moderately reduced. Left Atrium: Left atrial size was moderately dilated. Right Atrium: Right atrial size was normal in size. Pericardium: There is no evidence of pericardial effusion. Mitral Valve: The mitral valve is degenerative in appearance. There is moderate calcification of the mitral valve leaflet(s). Severe mitral annular calcification. Mild to moderate mitral valve regurgitation. MV peak gradient, 5.2 mmHg. The mean mitral valve gradient is 2.0 mmHg. Tricuspid Valve: The tricuspid valve is not well visualized. Tricuspid valve regurgitation is not demonstrated. Aortic Valve: The aortic valve is calcified. Aortic valve regurgitation is not visualized. Moderate aortic stenosis is present. Aortic valve mean gradient measures 7.3 mmHg. Aortic valve peak gradient measures 13.2 mmHg. Aortic valve area, by VTI measures 1.01 cm. Pulmonic Valve: The pulmonic valve was not well  visualized. Pulmonic valve regurgitation is not visualized. Aorta: The aortic root is normal in size and structure. Venous: The inferior vena cava was not well visualized. IAS/Shunts: No atrial level shunt detected by color flow Doppler.  LEFT VENTRICLE PLAX 2D                        Biplane EF (MOD) LVIDd:         5.53 cm         LV Biplane EF:   Left LVIDs:         4.81 cm                           ventricular LV PW:         0.99 cm                          ejection LV IVS:        0.66 cm                          fraction by LVOT diam:     2.10 cm                          2D MOD LV SV:         29                               biplane is LV SV Index:   16                               29.6 %. LVOT Area:     3.46 cm                                Diastology                                LV e' medial:    4.46 cm/s LV Volumes (MOD)               LV E/e' medial:  28.5 LV vol d, MOD    101.0 ml      LV e' lateral:   3.05 cm/s A2C:                           LV E/e' lateral: 41.6 LV vol d, MOD    95.9 ml A4C: LV vol s, MOD    69.3 ml A2C: LV vol s, MOD    64.1 ml A4C: LV SV MOD A2C:   31.7 ml LV SV MOD A4C:   95.9 ml LV SV MOD BP:    29.8 ml RIGHT VENTRICLE RV Basal diam:  2.66 cm RV S prime:     10.40 cm/s TAPSE (M-mode): 2.7 cm LEFT ATRIUM             Index       RIGHT ATRIUM           Index LA diam:        3.40 cm 1.88 cm/m  RA  Area:     10.40 cm LA Vol (A2C):   85.9 ml 47.42 ml/m RA Volume:   22.00 ml  12.14 ml/m LA Vol (A4C):   28.4 ml 15.68 ml/m LA Biplane Vol: 54.8 ml 30.25 ml/m  AORTIC VALVE                    PULMONIC VALVE AV Area (Vmax):    1.04 cm     PV Vmax:        0.54 m/s AV Area (Vmean):   1.00 cm     PV Peak grad:   1.1 mmHg AV Area (VTI):     1.01 cm     RVOT Peak grad: 2 mmHg AV Vmax:           181.67 cm/s AV Vmean:          124.333 cm/s AV VTI:            0.283 m AV Peak Grad:      13.2 mmHg AV Mean Grad:      7.3 mmHg LVOT Vmax:         54.80 cm/s LVOT Vmean:        36.000 cm/s LVOT VTI:          0.082 m LVOT/AV VTI ratio: 0.29  AORTA Ao Root diam: 2.50 cm MITRAL VALVE                TRICUSPID VALVE MV Area (PHT): 6.65 cm     TR Peak grad:   25.6 mmHg MV Area VTI:   1.18 cm     TR Vmax:        253.00 cm/s MV Peak grad:  5.2 mmHg MV Mean grad:  2.0 mmHg     SHUNTS MV Vmax:       1.14 m/s     Systemic VTI:  0.08 m MV Vmean:      67.9 cm/s    Systemic Diam: 2.10 cm MV Decel  Time: 114 msec MV E velocity: 127.00 cm/s MV A velocity: 97.70 cm/s MV E/A ratio:  1.30 Kate Sable MD Electronically signed by Kate Sable MD Signature Date/Time: 10/13/2020/5:18:55 PM    Final      Assessment and Plan:   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 BNP elevated to 1,331, chest CT with small left pleural effusion, large right pleural effusion, s/p IV lasix.  - Echo showed decreased EF 25-30%, global hypokinesis, mild-mod dilation G2DD, moderately reduced RV function, LA moderately dilated, mild to mod MR,  - IV lasix 40mg  daily - UOP -1L, 734ml in her room - weight down 154>140lbs - started on lisinopril - no LLE or JVD on exam. Diminished breath sounds - Scr/BUN elevated>>ACE held. Hold lasix if not improved tomorrow - Patient is off O2 and feels breathing is better - with large Rt pleural effusion consider tap - Due to decreased EF she will need ischemic, R/L heart cath, however patient is saying she wants to go home. Will discuss plan with MD.  Respiratory failure with hypoxia - multifactorial given COPD, CHF - IV lasix for CHF - IV steroids and azithromycin per IM - She is off supplemental O2  Coronary calcifications noted on CT - No chest pain, shortness of breath as above - started on aspirin 81mg  daily and Atorvastatin 10mg  daily - HS troponin minimally elevated with flat trend - will need ischemic evaluation at  some point  Tobacco use - smoked 1ppd since 75yo. Has not smoked since admission, unsure she will quite - cessation encouraged.   H/o RUL small cell lung cancer - s/p surgery, chemo, XRT  AAA aneurysm status post endovascular repair in 2018 - can follow with [facility] as OP for overdue imaging  For questions or updates, please contact [place] Please consult [email or web address] for contact info under    Signed, Samon Dishner Ninfa Meeker, PA-C  10/15/2020 7:40 AM

## 2020-10-15 NOTE — Care Management Important Message (Signed)
Important Message  Patient Details  Name: Roberta [name] MRN: [id number] Date of Birth: 10-01-1945   Medicare Important Message Given:  Yes     [name] 10/15/2020, 1:32 PM

## 2020-10-15 NOTE — Progress Notes (Signed)
Physical Therapy Treatment Patient Details Name: Roberta [name] MRN: [id number] DOB: Mar 11, 1946 Today's Date: 10/15/2020    History of Present Illness Roberta [name] is a 77yoF who comes to [facility] on 5/17 c SOB. PMH: LungCA s/p lobectomy, COPD (not on O2), tobacco dependence, BrCA s/p mastectomy, AAA s/p endovascular repair, PVD, B12 deficiency, vertebroplasties (T6, T4, L4). Pt admitted in COPD exacerbation.    PT Comments    Pt is making good progress towards goals with pt very eager to perform OOB mobility. Refuses further ambulation in room/hallway, due to fatigue. Pt is tired of being in bed, encouraged OOB mobility as much as tolerated with staff assistance. Supine there-ex performed. O2 sats at 92% on 1L of O2, removed for exertion with sats decreasing to 88% with exertion. Performed 10 reps of IS (heavy cues for technique), with O2 sats improving to 91%. No SOB symptoms noted. Will continue to progress as tolerated.  Follow Up Recommendations  Home health PT;Supervision for mobility/OOB;Supervision - Intermittent     Equipment Recommendations       Recommendations for Other Services       Precautions / Restrictions Precautions Precautions: Fall Restrictions Weight Bearing Restrictions: No    Mobility  Bed Mobility Overal bed mobility: Modified Independent Bed Mobility: Supine to Sit     Supine to sit: Modified independent (Device/Increase time)     General bed mobility comments: safe technique with ease of transfer. once seated at EOB, upright posture noted. slight impulsiveness noted    Transfers Overall transfer level: Needs assistance Equipment used: Rolling walker (2 wheeled) Transfers: Sit to/from [facility] Sit to Stand: Min guard         General transfer comment: able to stand with ease with cues for pushing from bed. Once standing needs cga for balance All mobility performed on RA  Ambulation/Gait Ambulation/Gait assistance: Min  guard Gait Distance (Feet): 5 Feet Assistive device: Rolling walker (2 wheeled) Gait Pattern/deviations: Step-to pattern     General Gait Details: ambulated short distance to recliner. Further distance offered, however declined by patient. Steady gait, however does need hands on assist. HR increases to 114bpm with exertion. O2 sats decrease to 88% on RA   Stairs             Wheelchair Mobility    Modified Rankin (Stroke Patients Only)       Balance Overall balance assessment: Needs assistance Sitting-balance support: Single extremity supported;Feet supported Sitting balance-Leahy Scale: Good     Standing balance support: Bilateral upper extremity supported Standing balance-Leahy Scale: Fair                              Cognition Arousal/Alertness: Awake/alert Behavior During Therapy: WFL for tasks assessed/performed Overall Cognitive Status: Within Functional Limits for tasks assessed                                        Exercises Other Exercises Other Exercises: supine/seated ther-ex performed including SLR, hip abd/add, and alt marching. 10 reps performed with cga    General Comments        Pertinent Vitals/Pain Pain Assessment: No/denies pain    Home Living                      Prior Function  PT Goals (current goals can now be found in the care plan section) Acute Rehab PT Goals Patient Stated Goal: return to home and regain baselin independence PT Goal Formulation: With patient Time For Goal Achievement: 10/27/20 Potential to Achieve Goals: Good Progress towards PT goals: Progressing toward goals    Frequency    Min 2X/week      PT Plan Current plan remains appropriate    Co-evaluation              AM-PAC PT "6 Clicks" Mobility   Outcome Measure  Help needed turning from your back to your side while in a flat bed without using bedrails?: A Little Help needed moving from lying  on your back to sitting on the side of a flat bed without using bedrails?: A Little Help needed moving to and from a bed to a chair (including a wheelchair)?: A Little Help needed standing up from a chair using your arms (e.g., wheelchair or bedside chair)?: A Little Help needed to walk in hospital room?: A Little Help needed climbing 3-5 steps with a railing? : A Little 6 Click Score: 18    End of Session Equipment Utilized During Treatment: Gait belt;Oxygen Activity Tolerance: Patient tolerated treatment well Patient left: in chair;with chair alarm set;with call bell/phone within reach Nurse Communication: Mobility status PT Visit Diagnosis: Unsteadiness on feet (R26.81);Difficulty in walking, not elsewhere classified (R26.2);Muscle weakness (generalized) (M62.81)     Time: 3968-8648 PT Time Calculation (min) (ACUTE ONLY): 15 min  Charges:  $Therapeutic Exercise: 8-22 mins                     Roberta [name], PT, DPT [phone]    Roberta [name] 10/15/2020, 1:12 PM

## 2020-10-15 NOTE — Progress Notes (Signed)
PROGRESS NOTE    Roberta [name]  VOP:[id number] DOB: Feb 20, 1946 DOA: 10/12/2020 PCP: Gayland Curry, MD   Brief Narrative:  74 y.o. 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  She presented to the emergency room today on account of worsening shortness of breath.  Onset of symptoms was 1 day prior to presentation.  She has been unable to sleep due to orthopnea and paroxysms of dyspnea.  She denied any associated chest pain palpitations nausea or vomiting.  Denied any lower extremity edema.  She denied any prior history of CHF diagnosis.  Patient started on a regimen of nebulizers, intravenous steroids, diuretics.  Echocardiogram demonstrates systolic congestive heart failure with EF 25 to 30%.  Cardiology consulted.  Case discussed with [facility] cardiology.   Assessment & Plan:   Active Problems:   COPD with acute exacerbation (HCC)  COPD with acute exacerbation Acute hypoxic respiratory failure Patient has longstanding COPD diagnosis Continue to smoke cigarettes Respiratory status improving slowly Plan: Continue IV Solu-Medrol 40 mg every 12 hours for today Can likely de-escalate to p.o. prednisone tomorrow Continue scheduled nebulizers every 4 hours Brovana twice daily Pulmicort twice daily Continue azithromycin for anti-inflammatory effect Wean oxygen as tolerated Goal saturation 88-92%   Newly diagnosed acute on chronic systolic congestive heart failure Echocardiogram EF 25 to 30% with global hypokinesis May warrant ischemic evaluation Cardiology consulted, [facility] Plan: Decrease Lasix 40 mg once daily DC lisinopril in setting of worsening kidney function Follow any further cardiac recommendations   Acute hyponatremia, improving Likely  secondary to hypervolemia Worsened to 124, now improved to 130, now 132 Plan: Continue diuretics as above Fluid restrict Monitor daily serum sodium  Acute kidney injury Possibly secondary to volume depletion Decrease dose of diuretic DC lisinopril for now Monitor daily kidney function  Sinus tachycardia Troponin elevation Patient denies palpitations or chest pain CT angiogram negative for PE Troponin not indicative of ACS Plan: Continue telemetry monitoring Vitals per unit protocol Treatment for CHF and COPD as above  Hyperlipidemia Statin  GERD PPI  History of right upper lobe small cell lung cancer Currently not on chemotherapy Status post surgery, chemotherapy, XRT Outpatient follow-up    DVT prophylaxis: SQ Lovenox Code Status: Full Family Communication: Daughter [name] [phone] on 5/20 Disposition Plan: Status is: Inpatient  Remains inpatient appropriate because:Inpatient level of care appropriate due to severity of illness   Dispo: The patient is from: Home              Anticipated d/c is to: Home              Patient currently is not medically stable to d/c.   Difficult to place patient No  Acute hypoxic respiratory failure secondary to decompensated COPD and new CHF.  Improving over interval.  Possible disposition in 24 to 48 hours.     Level of care: Progressive Cardiac  Consultants:   Cardiology  Procedures:   None   Antimicrobials: Azithromycin   Subjective: Patient seen and examined.  Shortness of breath started to improve.  Patient wanting to go home. Objective: Vitals:   10/15/20 0716 10/15/20 0756 10/15/20 0900 10/15/20 1223  BP:  104/62 97/61 (!) 90/45  Pulse:  (!) 103 (!) 103 99  Resp:  18 18 17   Temp:  (!) 97.5 F (36.4 C)  97.6 F (36.4 C) 97.9 F (36.6 C)  TempSrc:  Oral Oral Oral  SpO2: 91% 95% 100% 93%  Weight:      Height:        Intake/Output Summary (Last 24 hours) at 10/15/2020 1230 Last data filed at  10/15/2020 1225 Gross per 24 hour  Intake 600 ml  Output 1350 ml  Net -750 ml   Filed Weights   10/12/20 1118 10/14/20 0500 10/15/20 0409  Weight: 70 kg 64.3 kg 63.5 kg    Examination:  General exam: Appears calm and comfortable  Respiratory system: Bibasilar crackles.  No expiratory wheeze.  2 L Cardiovascular system: S1 & S2 heard, RRR. No JVD, murmurs, rubs, gallops or clicks. No pedal edema. Gastrointestinal system: Abdomen is nondistended, soft and nontender. No organomegaly or masses felt. Normal bowel sounds heard. Central nervous system: Alert and oriented. No focal neurological deficits. Extremities: Symmetric 5 x 5 power. Skin: No rashes, lesions or ulcers Psychiatry: Judgement and insight appear normal. Mood & affect appropriate.     Data Reviewed: I have personally reviewed following labs and imaging studies  CBC: Recent Labs  Lab 10/12/20 1112 10/13/20 0223 10/14/20 0751 10/15/20 0356  WBC 11.3* 14.5* 12.1* 14.0*  NEUTROABS  --  13.4* 11.2* 13.0*  HGB 10.9* 10.4* 10.5* 10.5*  HCT 33.4* 31.9* 31.8* 32.5*  MCV 90.5 89.9 90.1 90.8  PLT 441* 358 410* 160*   Basic Metabolic Panel: Recent Labs  Lab 10/12/20 1112 10/12/20 2115 10/13/20 0223 10/14/20 0751 10/15/20 0356  NA 125* 127* 124* 130* 132*  K 4.2  --  3.9 3.8 3.7  CL 89*  --  87* 89* 86*  CO2 25  --  26 29 34*  GLUCOSE 113*  --  139* 151* 142*  BUN 16  --  17 26* 42*  CREATININE 0.82  --  0.88 0.87 1.24*  CALCIUM 8.6*  --  8.6* 9.0 8.9  MG  --   --   --  1.9 2.3   GFR: Estimated Creatinine Clearance: 38.7 mL/min (A) (by C-G formula based on SCr of 1.24 mg/dL (H)). Liver Function Tests: Recent Labs  Lab 10/12/20 1112 10/13/20 0223  AST 24 22  ALT 17 16  ALKPHOS 107 94  BILITOT 0.8 0.8  PROT 6.6 6.4*  ALBUMIN 3.2* 3.0*   No results for input(s): LIPASE, AMYLASE in the last 168 hours. No results for input(s): AMMONIA in the last 168 hours. Coagulation Profile: Recent Labs  Lab  10/13/20 0223  INR 1.0   Cardiac Enzymes: No results for input(s): CKTOTAL, CKMB, CKMBINDEX, TROPONINI in the last 168 hours. BNP (last 3 results) No results for input(s): PROBNP in the last 8760 hours. HbA1C: No results for input(s): HGBA1C in the last 72 hours. CBG: No results for input(s): GLUCAP in the last 168 hours. Lipid Profile: No results for input(s): CHOL, HDL, LDLCALC, TRIG, CHOLHDL, LDLDIRECT in the last 72 hours. Thyroid Function Tests: No results for input(s): TSH, T4TOTAL, FREET4, T3FREE, THYROIDAB in the last 72 hours. Anemia Panel: No results for input(s): VITAMINB12, FOLATE, FERRITIN, TIBC, IRON, RETICCTPCT in the last 72 hours. Sepsis Labs: Recent Labs  Lab 10/13/20 0223 10/13/20 0603  LATICACIDVEN 0.9 0.9    Recent Results (from the past 240 hour(s))  Resp Panel by RT-PCR (Flu A&B, Covid) Nasopharyngeal Swab     Status: None   Collection Time: 10/12/20 11:21 AM   Specimen: Nasopharyngeal Swab; Nasopharyngeal(NP) swabs in vial transport medium  Result Value Ref Range  Status   SARS Coronavirus 2 by RT PCR NEGATIVE NEGATIVE Final    Comment: (NOTE) SARS-CoV-2 target nucleic acids are NOT DETECTED.  The SARS-CoV-2 RNA is generally detectable in upper respiratory specimens during the acute phase of infection. The lowest concentration of SARS-CoV-2 viral copies this assay can detect is 138 copies/mL. A negative result does not preclude SARS-Cov-2 infection and should not be used as the sole basis for treatment or other patient management decisions. A negative result may occur with  improper specimen collection/handling, submission of specimen other than nasopharyngeal swab, presence of viral mutation(s) within the areas targeted by this assay, and inadequate number of viral copies(<138 copies/mL). A negative result must be combined with clinical observations, patient history, and epidemiological information. The expected result is Negative.  Fact Sheet  for Patients:  EntrepreneurPulse.com.au  Fact Sheet for Healthcare Providers:  IncredibleEmployment.be  This test is no t yet approved or cleared by the [place] FDA and  has been authorized for detection and/or diagnosis of SARS-CoV-2 by FDA under an Emergency Use Authorization (EUA). This EUA will remain  in effect (meaning this test can be used) for the duration of the COVID-19 declaration under Section 564(b)(1) of the Act, 21 U.S.C.section 360bbb-3(b)(1), unless the authorization is terminated  or revoked sooner.       Influenza A by PCR NEGATIVE NEGATIVE Final   Influenza B by PCR NEGATIVE NEGATIVE Final    Comment: (NOTE) The Xpert Xpress SARS-CoV-2/FLU/RSV plus assay is intended as an aid in the diagnosis of influenza from Nasopharyngeal swab specimens and should not be used as a sole basis for treatment. Nasal washings and aspirates are unacceptable for Xpert Xpress SARS-CoV-2/FLU/RSV testing.  Fact Sheet for Patients: EntrepreneurPulse.com.au  Fact Sheet for Healthcare Providers: IncredibleEmployment.be  This test is not yet approved or cleared by the [place] FDA and has been authorized for detection and/or diagnosis of SARS-CoV-2 by FDA under an Emergency Use Authorization (EUA). This EUA will remain in effect (meaning this test can be used) for the duration of the COVID-19 declaration under Section 564(b)(1) of the Act, 21 U.S.C. section 360bbb-3(b)(1), unless the authorization is terminated or revoked.  Performed at [facility], [address] [place], [place] [address]   Culture, blood (x 2)     Status: None (Preliminary result)   Collection Time: 10/13/20  2:23 AM   Specimen: BLOOD  Result Value Ref Range Status   Specimen Description BLOOD RIGHT HAND  Final   Special Requests   Final    BOTTLES DRAWN AEROBIC AND ANAEROBIC Blood Culture results may not be optimal  due to an inadequate volume of blood received in culture bottles   Culture   Final    NO GROWTH 2 DAYS Performed at [facility], [address], [place], [place] [address]    Report Status PENDING  Incomplete  Culture, blood (x 2)     Status: None (Preliminary result)   Collection Time: 10/13/20  2:24 AM   Specimen: BLOOD  Result Value Ref Range Status   Specimen Description BLOOD RIGHT FA  Final   Special Requests   Final    BOTTLES DRAWN AEROBIC AND ANAEROBIC Blood Culture adequate volume   Culture   Final    NO GROWTH 2 DAYS Performed at [facility], [address], [place], [place] [address]    Report Status PENDING  Incomplete         Radiology Studies: DG Abd 1 View  Result  Date: 10/15/2020 CLINICAL DATA:  Constipation with pain EXAM: ABDOMEN - 1 VIEW COMPARISON:  CT abdomen and pelvis May 04, 2020 FINDINGS: There is diffuse stool throughout the colon. There is no bowel dilatation or air-fluid level to suggest bowel obstruction. No free air. There is an aortic and bilateral iliac artery stent, unchanged. There are stable pelvic calcifications, likely of vascular etiology. Status post kyphoplasty at L4. IMPRESSION: Diffuse stool throughout colon, likely due to constipation. No bowel obstruction or free air evident. Aorto bi-iliac stent again noted. Electronically Signed   By: Lowella Grip III M.D.   On: 10/15/2020 10:00        Scheduled Meds: . arformoterol  15 mcg Nebulization BID  . aspirin EC  81 mg Oral Daily  . atorvastatin  10 mg Oral QHS  . azithromycin  500 mg Oral Daily  . budesonide (PULMICORT) nebulizer solution  0.25 mg Nebulization BID  . Chlorhexidine Gluconate Cloth  6 each Topical Daily  . enoxaparin (LOVENOX) injection  40 mg Subcutaneous Q24H  . feeding supplement  237 mL Oral BID BM  . furosemide  40 mg Intravenous Daily  . ipratropium-albuterol  3 mL Nebulization TID  . mouth rinse  15 mL Mouth Rinse BID  .  methylPREDNISolone (SOLU-MEDROL) injection  40 mg Intravenous Q12H  . multivitamin with minerals  1 tablet Oral Daily  . nicotine  14 mg Transdermal Daily  . pantoprazole  40 mg Oral Daily  . polyethylene glycol  17 g Oral Daily  . senna-docusate  1 tablet Oral BID  . sodium chloride flush  3 mL Intravenous Q12H  . vitamin B-12  1,000 mcg Oral Daily   Continuous Infusions: . sodium chloride       LOS: 3 days    Time spent: 25 minutes    Sidney Ace, MD [facility] Pager [phone]  If 7PM-7AM, please contact night-coverage 10/15/2020, 12:30 PM

## 2020-10-16 DIAGNOSIS — I5041 Acute combined systolic (congestive) and diastolic (congestive) heart failure: Secondary | ICD-10-CM

## 2020-10-16 DIAGNOSIS — N179 Acute kidney failure, unspecified: Secondary | ICD-10-CM

## 2020-10-16 LAB — CBC WITH DIFFERENTIAL/PLATELET
Abs Immature Granulocytes: 0.21 10*3/uL — ABNORMAL HIGH (ref 0.00–0.07)
Basophils Absolute: 0 10*3/uL (ref 0.0–0.1)
Basophils Relative: 0 %
Eosinophils Absolute: 0 10*3/uL (ref 0.0–0.5)
Eosinophils Relative: 0 %
HCT: 34.6 % — ABNORMAL LOW (ref 36.0–46.0)
Hemoglobin: 11.5 g/dL — ABNORMAL LOW (ref 12.0–15.0)
Immature Granulocytes: 1 %
Lymphocytes Relative: 3 %
Lymphs Abs: 0.5 10*3/uL — ABNORMAL LOW (ref 0.7–4.0)
MCH: 29.7 pg (ref 26.0–34.0)
MCHC: 33.2 g/dL (ref 30.0–36.0)
MCV: 89.4 fL (ref 80.0–100.0)
Monocytes Absolute: 0.9 10*3/uL (ref 0.1–1.0)
Monocytes Relative: 5 %
Neutro Abs: 15.4 10*3/uL — ABNORMAL HIGH (ref 1.7–7.7)
Neutrophils Relative %: 91 %
Platelets: 452 10*3/uL — ABNORMAL HIGH (ref 150–400)
RBC: 3.87 MIL/uL (ref 3.87–5.11)
RDW: 15.4 % (ref 11.5–15.5)
WBC: 17 10*3/uL — ABNORMAL HIGH (ref 4.0–10.5)
nRBC: 0 % (ref 0.0–0.2)

## 2020-10-16 LAB — MAGNESIUM: Magnesium: 2.2 mg/dL (ref 1.7–2.4)

## 2020-10-16 LAB — BASIC METABOLIC PANEL
Anion gap: 11 (ref 5–15)
BUN: 55 mg/dL — ABNORMAL HIGH (ref 8–23)
CO2: 35 mmol/L — ABNORMAL HIGH (ref 22–32)
Calcium: 9.1 mg/dL (ref 8.9–10.3)
Chloride: 86 mmol/L — ABNORMAL LOW (ref 98–111)
Creatinine, Ser: 1.51 mg/dL — ABNORMAL HIGH (ref 0.44–1.00)
GFR, Estimated: 36 mL/min — ABNORMAL LOW (ref >60)
Glucose, Bld: 123 mg/dL — ABNORMAL HIGH (ref 70–99)
Potassium: 4.1 mmol/L (ref 3.5–5.1)
Sodium: 132 mmol/L — ABNORMAL LOW (ref 135–145)

## 2020-10-16 MED ORDER — PREDNISONE 20 MG PO TABS
20.0000 mg | ORAL_TABLET | Freq: Every day | ORAL | Status: DC
  Administered 2020-10-17: 20 mg via ORAL
  Filled 2020-10-16: qty 1

## 2020-10-16 NOTE — Progress Notes (Addendum)
Progress Note  Patient Name: Roberta [name] Date of Encounter: 10/16/2020  [facility] Cardiologist: [facility]  Subjective   Cr/BUN held and ACE and lasix held, mildly up again today. Patient says breathing is better, off O2, can lay flat.   Inpatient Medications    Scheduled Meds: . arformoterol  15 mcg Nebulization BID  . aspirin EC  81 mg Oral Daily  . atorvastatin  10 mg Oral QHS  . budesonide (PULMICORT) nebulizer solution  0.25 mg Nebulization BID  . Chlorhexidine Gluconate Cloth  6 each Topical Daily  . enoxaparin (LOVENOX) injection  40 mg Subcutaneous Q24H  . feeding supplement  237 mL Oral BID BM  . ipratropium-albuterol  3 mL Nebulization TID  . mouth rinse  15 mL Mouth Rinse BID  . multivitamin with minerals  1 tablet Oral Daily  . nicotine  14 mg Transdermal Daily  . pantoprazole  40 mg Oral Daily  . polyethylene glycol  17 g Oral Daily  . [START ON 10/17/2020] predniSONE  20 mg Oral Q breakfast  . senna-docusate  1 tablet Oral BID  . sodium chloride flush  3 mL Intravenous Q12H  . vitamin B-12  1,000 mcg Oral Daily   Continuous Infusions: . sodium chloride     PRN Meds: sodium chloride, acetaminophen, bisacodyl, ipratropium-albuterol, ondansetron (ZOFRAN) IV, sodium chloride flush, zolpidem   Vital Signs    Vitals:   10/16/20 0448 10/16/20 0500 10/16/20 0713 10/16/20 0828  BP: 120/69   103/64  Pulse: 95   98  Resp: 18   17  Temp: 97.6 F (36.4 C)   97.9 F (36.6 C)  TempSrc: Oral     SpO2: 95%  93% 93%  Weight:  63.9 kg    Height:        Intake/Output Summary (Last 24 hours) at 10/16/2020 1018 Last data filed at 10/16/2020 0940 Gross per 24 hour  Intake 600 ml  Output 1001 ml  Net -401 ml   Last 3 Weights 10/16/2020 10/15/2020 10/14/2020  Weight (lbs) 140 lb 14.4 oz 140 lb 1.6 oz 141 lb 11.2 oz  Weight (kg) 63.912 kg 63.549 kg 64.275 kg      Telemetry    SR HR 80-100, PACs/PVCs- Personally Reviewed  ECG    No new - Personally  Reviewed  Physical Exam   GEN: No acute distress.   Neck: No JVD Cardiac: RRR, no murmurs, rubs, or gallops.  Respiratory: diffusely diminished, wheezing GI: Soft, nontender, non-distended  MS: No edema; No deformity. Neuro:  Nonfocal  Psych: Normal affect   Labs    High Sensitivity Troponin:   Recent Labs  Lab 10/12/20 1112 10/12/20 1318  TROPONINIHS 52* 52*      Chemistry Recent Labs  Lab 10/12/20 1112 10/12/20 2115 10/13/20 0223 10/14/20 0751 10/15/20 0356 10/16/20 0430  NA 125*   < > 124* 130* 132* 132*  K 4.2  --  3.9 3.8 3.7 4.1  CL 89*  --  87* 89* 86* 86*  CO2 25  --  26 29 34* 35*  GLUCOSE 113*  --  139* 151* 142* 123*  BUN 16  --  17 26* 42* 55*  CREATININE 0.82  --  0.88 0.87 1.24* 1.51*  CALCIUM 8.6*  --  8.6* 9.0 8.9 9.1  PROT 6.6  --  6.4*  --   --   --   ALBUMIN 3.2*  --  3.0*  --   --   --  AST 24  --  22  --   --   --   ALT 17  --  16  --   --   --   ALKPHOS 107  --  94  --   --   --   BILITOT 0.8  --  0.8  --   --   --   GFRNONAA >60  --  >60 >60 46* 36*  ANIONGAP 11  --  11 12 12 11    < > = values in this interval not displayed.     Hematology Recent Labs  Lab 10/14/20 0751 10/15/20 0356 10/16/20 0430  WBC 12.1* 14.0* 17.0*  RBC 3.53* 3.58* 3.87  HGB 10.5* 10.5* 11.5*  HCT 31.8* 32.5* 34.6*  MCV 90.1 90.8 89.4  MCH 29.7 29.3 29.7  MCHC 33.0 32.3 33.2  RDW 14.9 15.0 15.4  PLT 410* 412* 452*    BNP Recent Labs  Lab 10/12/20 1112  BNP 1,331.8*     DDimer  Recent Labs  Lab 10/12/20 1121  DDIMER 1.00*     Radiology    DG Abd 1 View  Result Date: 10/15/2020 CLINICAL DATA:  Constipation with pain EXAM: ABDOMEN - 1 VIEW COMPARISON:  CT abdomen and pelvis May 04, 2020 FINDINGS: There is diffuse stool throughout the colon. There is no bowel dilatation or air-fluid level to suggest bowel obstruction. No free air. There is an aortic and bilateral iliac artery stent, unchanged. There are stable pelvic calcifications,  likely of vascular etiology. Status post kyphoplasty at L4. IMPRESSION: Diffuse stool throughout colon, likely due to constipation. No bowel obstruction or free air evident. Aorto bi-iliac stent again noted. Electronically Signed   By: Lowella Grip III M.D.   On: 10/15/2020 10:00   DG Chest Port 1 View  Result Date: 10/15/2020 CLINICAL DATA:  Pleural effusion. EXAM: PORTABLE CHEST 1 VIEW COMPARISON:  Chest radiograph dated 10/12/2020. FINDINGS: The heart size is normal. Vascular calcifications are seen in the aortic arch. A left internal jugular central venous port catheter tip overlies the superior cavoatrial junction. A moderate right pleural effusion appears similar to prior exam. Associated atelectasis/airspace disease appears decreased. Mild diffuse bilateral interstitial opacities appear decreased since prior exam. Post radiation changes in the right lung are redemonstrated. Vertebroplasty changes are seen in the thoracic spine. IMPRESSION: No significant change in a moderate right pleural effusion. Decreased bilateral interstitial and airspace opacities. Electronically Signed   By: Zerita Boers M.D.   On: 10/15/2020 15:32    Cardiac Studies   Echo 10/13/20 1. Left ventricular ejection fraction, by estimation, is 25 to 30%. Left  ventricular ejection fraction by 2D MOD biplane is 29.6 %. The left  ventricle has severely decreased function. The left ventricle demonstrates  global hypokinesis. The left  ventricular internal cavity size was mildly to moderately dilated. Left  ventricular diastolic parameters are consistent with Grade II diastolic  dysfunction (pseudonormalization).  2. Right ventricular systolic function is moderately reduced. The right  ventricular size is normal.  3. Left atrial size was moderately dilated.  4. The mitral valve is degenerative. Mild to moderate mitral valve  regurgitation. Severe mitral annular calcification.  5. The aortic valve is calcified.  Aortic valve regurgitation is not  visualized. Moderate aortic valve stenosis.   Myoview stress test 2018 Negative lexiscan stress, LV function normal, No reversible ischemia  Patient Profile     75 y.o. 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 BNP elevated to 1,331, chest CT with small left pleural effusion, large right pleural effusion, s/p IV lasix.  - Echo showed decreased EF 25-30%, global hypokinesis, mild-mod dilation G2DD, moderately reduced RV function, LA moderately dilated, mild to mod MR,  -was on IV lasix 40mg  daily - started on lisinopril - Scr/BUn elevated and ACE and lasix held. Cr and Bun up again today - UOP -1L, unsure this is accurate - weight down 154>140lbs - no LLE or JVD on exam. Diminished breath sounds - No plan for rt thoracentesis given she is on RA - Patient is off O2 and feels breathing is better - Due to decreased EF she will need ischemic eval,however can be OP as she is asymptomatic  Respiratory failure with hypoxia - multifactorial given COPD, CHF - IV lasix for CHF - IV steroids and azithromycin per IM - She is off supplemental O2  Coronary calcifications noted on CT - No chest pain - started on aspirin 81mg  daily and Atorvastatin 10mg  daily - HS troponin minimally elevated with flat trend - will need ischemic evaluation as above  Tobacco use - smoked 1ppd since 75yo. Has not smoked since admission, unsure she will quite - cessation encouraged.   H/o RUL small cell lung cancer - s/p surgery, chemo, XRT  AAA aneurysm status post endovascular repair in  2018 - can follow with [facility] as OP for overdue imaging  For questions or updates, please contact [place] Please consult [email or web address] for contact info under        Signed, Cadence Ninfa Meeker, PA-C  10/16/2020, 10:18 AM     Attending Addendum:   History and all data above reviewed.  Patient examined.  I agree with the findings as above. All available labs, radiology testing, previous records reviewed. Agree with documented assessment and plan. Ms. Crilly is a 31F with small cell lung cancer status post lobectomy, COPD, tobacco abuse, breast cancer status postmastectomy, abdominal aortic aneurysm status post endovascular repair, and PAD admitted with new acute systolic and diastolic heart failure.  LVEF this admission is 25 to 30%.  BNP was elevated to 1300.  She has been diuresed and is net -1.1 L.  Her weight is essentially unchanged.  She is, however feeling better.  Renal function has worsened in the setting of adding lisinopril and diuresis.  Would continue to hold diuretics for now.  Hopefully her renal function will start improving tomorrow.  She may benefit from being seen in the advanced heart failure clinic as an outpatient.  Also plans for outpatient ischemia evaluation.  High-sensitivity troponin was elevated to the 50s but relatively flat.  She also has coronary calcification on CT.  Her blood pressure is too low for guideline directed medical therapy at this time.  Uno Esau C. Oval Linsey, MD, [facility]  10/16/2020 11:45 AM

## 2020-10-16 NOTE — Progress Notes (Signed)
PROGRESS NOTE    Roberta [name]  XHB:[id number] DOB: November 15, 1945 DOA: 10/12/2020 PCP: Gayland Curry, MD   Brief Narrative:  75 y.o. 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  She presented to the emergency room today on account of worsening shortness of breath.  Onset of symptoms was 1 day prior to presentation.  She has been unable to sleep due to orthopnea and paroxysms of dyspnea.  She denied any associated chest pain palpitations nausea or vomiting.  Denied any lower extremity edema.  She denied any prior history of CHF diagnosis.  Patient started on a regimen of nebulizers, intravenous steroids, diuretics.  Echocardiogram demonstrates systolic congestive heart failure with EF 25 to 30%.  Cardiology consulted.  Case discussed with [facility] cardiology. Patient has improved substantially.  Off supplemental oxygen.  Currently flat.  Kidney function worsening.   Assessment & Plan:   Active Problems:   COPD with acute exacerbation (HCC)  COPD with acute exacerbation Acute hypoxic respiratory failure Patient has longstanding COPD diagnosis Continue to smoke cigarettes Respiratory status improving slowly Weaned to room air Plan: DC IV steroids Start p.o. prednisone 20 mg DuoNebs 3 times daily Brovana twice daily Pulmicort twice daily Continue azithromycin, last dose today Oxygen if needed, weaned to room air Goal saturation 88-92%   Newly diagnosed acute on chronic systolic congestive heart failure Echocardiogram EF 25 to 30% with global hypokinesis May warrant ischemic evaluation Cardiology consulted, [facility] Per cardiology ischemic evaluation may be deferred outpatient Plan: DC Lasix and ACE inhibitor in setting of worsening kidney function Follow  any further cardiac recommendations Recheck creatinine in a.m.  If improving patient may build to discharge home and follow-up with cardiology in office   Acute hyponatremia, improving Likely secondary to hypervolemia Worsened to 124, now improved to 130, now 132 Plan: Diuretics on hold as above Fluid restrict Monitor daily serum sodium  Acute kidney injury Possibly secondary to volume depletion/contraction alkalosis Diuretic and ACE inhibitor on hold Monitor daily kidney function If creatinine improving can discharge likely tomorrow and follow-up cardiology outpatient  Sinus tachycardia Troponin elevation Patient denies palpitations or chest pain CT angiogram negative for PE Troponin not indicative of ACS Plan: Continue telemetry monitoring Vitals per unit protocol Treatment for CHF and COPD as above  Hyperlipidemia Statin  GERD PPI  History of right upper lobe small cell lung cancer Currently not on chemotherapy Status post surgery, chemotherapy, XRT Outpatient follow-up    DVT prophylaxis: SQ Lovenox Code Status: Full Family Communication: Daughter [name] [phone] at bedside on 5/21 Disposition Plan: Status is: Inpatient  Remains inpatient appropriate because:Inpatient level of care appropriate due to severity of illness   Dispo: The patient is from: Home              Anticipated d/c is to: Home              Patient currently is not medically stable to d/c.   Difficult to place patient No  Resolving decompensated COPD and CHF.  Worsening kidney function.  Possible discharge in 24 to 48 hours of kidney function improved.     Level of care: Progressive Cardiac  Consultants:   Cardiology  Procedures:   None   Antimicrobials: Azithromycin   Subjective: Patient seen and examined.  Daughter at bedside.  Shortness of breath  improved.  Weaned to room air.  Patient wanting to go home.  Objective: Vitals:   10/16/20 0448 10/16/20 0500  10/16/20 0713 10/16/20 0828  BP: 120/69   103/64  Pulse: 95   98  Resp: 18   17  Temp: 97.6 F (36.4 C)   97.9 F (36.6 C)  TempSrc: Oral     SpO2: 95%  93% 93%  Weight:  63.9 kg    Height:        Intake/Output Summary (Last 24 hours) at 10/16/2020 1148 Last data filed at 10/16/2020 0940 Gross per 24 hour  Intake 600 ml  Output 1001 ml  Net -401 ml   Filed Weights   10/14/20 0500 10/15/20 0409 10/16/20 0500  Weight: 64.3 kg 63.5 kg 63.9 kg    Examination:  General exam: Appears calm and comfortable  Respiratory system: Right-sided crackles.  No expiratory wheeze.  Room air Cardiovascular system: S1-S2, regular rate and rhythm, no murmurs, no pedal edema Gastrointestinal system: Abdomen is nondistended, soft and nontender. No organomegaly or masses felt. Normal bowel sounds heard. Central nervous system: Alert and oriented. No focal neurological deficits. Extremities: Symmetric 5 x 5 power. Skin: No rashes, lesions or ulcers Psychiatry: Judgement and insight appear normal. Mood & affect appropriate.     Data Reviewed: I have personally reviewed following labs and imaging studies  CBC: Recent Labs  Lab 10/12/20 1112 10/13/20 0223 10/14/20 0751 10/15/20 0356 10/16/20 0430  WBC 11.3* 14.5* 12.1* 14.0* 17.0*  NEUTROABS  --  13.4* 11.2* 13.0* 15.4*  HGB 10.9* 10.4* 10.5* 10.5* 11.5*  HCT 33.4* 31.9* 31.8* 32.5* 34.6*  MCV 90.5 89.9 90.1 90.8 89.4  PLT 441* 358 410* 412* 161*   Basic Metabolic Panel: Recent Labs  Lab 10/12/20 1112 10/12/20 2115 10/13/20 0223 10/14/20 0751 10/15/20 0356 10/16/20 0430  NA 125* 127* 124* 130* 132* 132*  K 4.2  --  3.9 3.8 3.7 4.1  CL 89*  --  87* 89* 86* 86*  CO2 25  --  26 29 34* 35*  GLUCOSE 113*  --  139* 151* 142* 123*  BUN 16  --  17 26* 42* 55*  CREATININE 0.82  --  0.88 0.87 1.24* 1.51*  CALCIUM 8.6*  --  8.6* 9.0 8.9 9.1  MG  --   --   --  1.9 2.3 2.2   GFR: Estimated Creatinine Clearance: 31.8 mL/min (A) (by C-G  formula based on SCr of 1.51 mg/dL (H)). Liver Function Tests: Recent Labs  Lab 10/12/20 1112 10/13/20 0223  AST 24 22  ALT 17 16  ALKPHOS 107 94  BILITOT 0.8 0.8  PROT 6.6 6.4*  ALBUMIN 3.2* 3.0*   No results for input(s): LIPASE, AMYLASE in the last 168 hours. No results for input(s): AMMONIA in the last 168 hours. Coagulation Profile: Recent Labs  Lab 10/13/20 0223  INR 1.0   Cardiac Enzymes: No results for input(s): CKTOTAL, CKMB, CKMBINDEX, TROPONINI in the last 168 hours. BNP (last 3 results) No results for input(s): PROBNP in the last 8760 hours. HbA1C: No results for input(s): HGBA1C in the last 72 hours. CBG: No results for input(s): GLUCAP in the last 168 hours. Lipid Profile: No results for input(s): CHOL, HDL, LDLCALC, TRIG, CHOLHDL, LDLDIRECT in the last 72 hours. Thyroid Function Tests: No results for input(s): TSH, T4TOTAL, FREET4, T3FREE, THYROIDAB in the last 72 hours. Anemia Panel: No results for input(s): VITAMINB12, FOLATE, FERRITIN, TIBC, IRON, RETICCTPCT in the last 72 hours. Sepsis  Labs: Recent Labs  Lab 10/13/20 0223 10/13/20 0603  LATICACIDVEN 0.9 0.9    Recent Results (from the past 240 hour(s))  Resp Panel by RT-PCR (Flu A&B, Covid) Nasopharyngeal Swab     Status: None   Collection Time: 10/12/20 11:21 AM   Specimen: Nasopharyngeal Swab; Nasopharyngeal(NP) swabs in vial transport medium  Result Value Ref Range Status   SARS Coronavirus 2 by RT PCR NEGATIVE NEGATIVE Final    Comment: (NOTE) SARS-CoV-2 target nucleic acids are NOT DETECTED.  The SARS-CoV-2 RNA is generally detectable in upper respiratory specimens during the acute phase of infection. The lowest concentration of SARS-CoV-2 viral copies this assay can detect is 138 copies/mL. A negative result does not preclude SARS-Cov-2 infection and should not be used as the sole basis for treatment or other patient management decisions. A negative result may occur with  improper  specimen collection/handling, submission of specimen other than nasopharyngeal swab, presence of viral mutation(s) within the areas targeted by this assay, and inadequate number of viral copies(<138 copies/mL). A negative result must be combined with clinical observations, patient history, and epidemiological information. The expected result is Negative.  Fact Sheet for Patients:  EntrepreneurPulse.com.au  Fact Sheet for Healthcare Providers:  IncredibleEmployment.be  This test is no t yet approved or cleared by the [place] FDA and  has been authorized for detection and/or diagnosis of SARS-CoV-2 by FDA under an Emergency Use Authorization (EUA). This EUA will remain  in effect (meaning this test can be used) for the duration of the COVID-19 declaration under Section 564(b)(1) of the Act, 21 U.S.C.section 360bbb-3(b)(1), unless the authorization is terminated  or revoked sooner.       Influenza A by PCR NEGATIVE NEGATIVE Final   Influenza B by PCR NEGATIVE NEGATIVE Final    Comment: (NOTE) The Xpert Xpress SARS-CoV-2/FLU/RSV plus assay is intended as an aid in the diagnosis of influenza from Nasopharyngeal swab specimens and should not be used as a sole basis for treatment. Nasal washings and aspirates are unacceptable for Xpert Xpress SARS-CoV-2/FLU/RSV testing.  Fact Sheet for Patients: EntrepreneurPulse.com.au  Fact Sheet for Healthcare Providers: IncredibleEmployment.be  This test is not yet approved or cleared by the [place] FDA and has been authorized for detection and/or diagnosis of SARS-CoV-2 by FDA under an Emergency Use Authorization (EUA). This EUA will remain in effect (meaning this test can be used) for the duration of the COVID-19 declaration under Section 564(b)(1) of the Act, 21 U.S.C. section 360bbb-3(b)(1), unless the authorization is terminated or revoked.  Performed at  [facility], [address] [place], [place] [address]   Culture, blood (x 2)     Status: None (Preliminary result)   Collection Time: 10/13/20  2:23 AM   Specimen: BLOOD  Result Value Ref Range Status   Specimen Description BLOOD RIGHT HAND  Final   Special Requests   Final    BOTTLES DRAWN AEROBIC AND ANAEROBIC Blood Culture results may not be optimal due to an inadequate volume of blood received in culture bottles   Culture   Final    NO GROWTH 3 DAYS Performed at [facility], [address] [place], [place] [address]    Report Status PENDING  Incomplete  Culture, blood (x 2)     Status: None (Preliminary result)   Collection Time: 10/13/20  2:24 AM   Specimen: BLOOD  Result Value Ref Range Status   Specimen Description BLOOD RIGHT FA  Final   Special Requests  Final    BOTTLES DRAWN AEROBIC AND ANAEROBIC Blood Culture adequate volume   Culture   Final    NO GROWTH 3 DAYS Performed at [facility], [address] [place], [place] [address]    Report Status PENDING  Incomplete         Radiology Studies: DG Abd 1 View  Result Date: 10/15/2020 CLINICAL DATA:  Constipation with pain EXAM: ABDOMEN - 1 VIEW COMPARISON:  CT abdomen and pelvis May 04, 2020 FINDINGS: There is diffuse stool throughout the colon. There is no bowel dilatation or air-fluid level to suggest bowel obstruction. No free air. There is an aortic and bilateral iliac artery stent, unchanged. There are stable pelvic calcifications, likely of vascular etiology. Status post kyphoplasty at L4. IMPRESSION: Diffuse stool throughout colon, likely due to constipation. No bowel obstruction or free air evident. Aorto bi-iliac stent again noted. Electronically Signed   By: Lowella Grip III M.D.   On: 10/15/2020 10:00   DG Chest Port 1 View  Result Date: 10/15/2020 CLINICAL DATA:  Pleural effusion. EXAM: PORTABLE CHEST 1 VIEW COMPARISON:  Chest radiograph dated 10/12/2020.  FINDINGS: The heart size is normal. Vascular calcifications are seen in the aortic arch. A left internal jugular central venous port catheter tip overlies the superior cavoatrial junction. A moderate right pleural effusion appears similar to prior exam. Associated atelectasis/airspace disease appears decreased. Mild diffuse bilateral interstitial opacities appear decreased since prior exam. Post radiation changes in the right lung are redemonstrated. Vertebroplasty changes are seen in the thoracic spine. IMPRESSION: No significant change in a moderate right pleural effusion. Decreased bilateral interstitial and airspace opacities. Electronically Signed   By: Zerita Boers M.D.   On: 10/15/2020 15:32        Scheduled Meds: . arformoterol  15 mcg Nebulization BID  . aspirin EC  81 mg Oral Daily  . atorvastatin  10 mg Oral QHS  . budesonide (PULMICORT) nebulizer solution  0.25 mg Nebulization BID  . Chlorhexidine Gluconate Cloth  6 each Topical Daily  . enoxaparin (LOVENOX) injection  40 mg Subcutaneous Q24H  . feeding supplement  237 mL Oral BID BM  . ipratropium-albuterol  3 mL Nebulization TID  . mouth rinse  15 mL Mouth Rinse BID  . multivitamin with minerals  1 tablet Oral Daily  . nicotine  14 mg Transdermal Daily  . pantoprazole  40 mg Oral Daily  . polyethylene glycol  17 g Oral Daily  . [START ON 10/17/2020] predniSONE  20 mg Oral Q breakfast  . senna-docusate  1 tablet Oral BID  . sodium chloride flush  3 mL Intravenous Q12H  . vitamin B-12  1,000 mcg Oral Daily   Continuous Infusions: . sodium chloride       LOS: 4 days    Time spent: 25 minutes    Sidney Ace, MD [facility] Pager [phone]  If 7PM-7AM, please contact night-coverage 10/16/2020, 11:48 AM

## 2020-10-17 DIAGNOSIS — I248 Other forms of acute ischemic heart disease: Secondary | ICD-10-CM

## 2020-10-17 LAB — CBC WITH DIFFERENTIAL/PLATELET
Abs Immature Granulocytes: 0.37 10*3/uL — ABNORMAL HIGH (ref 0.00–0.07)
Basophils Absolute: 0 10*3/uL (ref 0.0–0.1)
Basophils Relative: 0 %
Eosinophils Absolute: 0 10*3/uL (ref 0.0–0.5)
Eosinophils Relative: 0 %
HCT: 33.3 % — ABNORMAL LOW (ref 36.0–46.0)
Hemoglobin: 11.1 g/dL — ABNORMAL LOW (ref 12.0–15.0)
Immature Granulocytes: 2 %
Lymphocytes Relative: 7 %
Lymphs Abs: 1.2 10*3/uL (ref 0.7–4.0)
MCH: 29.8 pg (ref 26.0–34.0)
MCHC: 33.3 g/dL (ref 30.0–36.0)
MCV: 89.5 fL (ref 80.0–100.0)
Monocytes Absolute: 1.6 10*3/uL — ABNORMAL HIGH (ref 0.1–1.0)
Monocytes Relative: 10 %
Neutro Abs: 13.5 10*3/uL — ABNORMAL HIGH (ref 1.7–7.7)
Neutrophils Relative %: 81 %
Platelets: 448 10*3/uL — ABNORMAL HIGH (ref 150–400)
RBC: 3.72 MIL/uL — ABNORMAL LOW (ref 3.87–5.11)
RDW: 15.2 % (ref 11.5–15.5)
WBC: 16.6 10*3/uL — ABNORMAL HIGH (ref 4.0–10.5)
nRBC: 0 % (ref 0.0–0.2)

## 2020-10-17 LAB — BASIC METABOLIC PANEL
Anion gap: 11 (ref 5–15)
BUN: 51 mg/dL — ABNORMAL HIGH (ref 8–23)
CO2: 34 mmol/L — ABNORMAL HIGH (ref 22–32)
Calcium: 8.7 mg/dL — ABNORMAL LOW (ref 8.9–10.3)
Chloride: 88 mmol/L — ABNORMAL LOW (ref 98–111)
Creatinine, Ser: 1.16 mg/dL — ABNORMAL HIGH (ref 0.44–1.00)
GFR, Estimated: 49 mL/min — ABNORMAL LOW (ref >60)
Glucose, Bld: 88 mg/dL (ref 70–99)
Potassium: 4.2 mmol/L (ref 3.5–5.1)
Sodium: 133 mmol/L — ABNORMAL LOW (ref 135–145)

## 2020-10-17 LAB — MAGNESIUM: Magnesium: 2.3 mg/dL (ref 1.7–2.4)

## 2020-10-17 MED ORDER — METOPROLOL TARTRATE 25 MG PO TABS
12.5000 mg | ORAL_TABLET | Freq: Two times a day (BID) | ORAL | Status: DC
  Administered 2020-10-17: 12.5 mg via ORAL
  Filled 2020-10-17: qty 1

## 2020-10-17 MED ORDER — NICOTINE 14 MG/24HR TD PT24: 14.0000 mg | MEDICATED_PATCH | Freq: Every day | TRANSDERMAL | 0 refills | Status: AC

## 2020-10-17 MED ORDER — DAPAGLIFLOZIN PROPANEDIOL 10 MG PO TABS: 10.0000 mg | ORAL_TABLET | Freq: Every day | ORAL | 0 refills | Status: AC

## 2020-10-17 MED ORDER — DAPAGLIFLOZIN PROPANEDIOL 5 MG PO TABS
10.0000 mg | ORAL_TABLET | Freq: Every day | ORAL | Status: DC
  Filled 2020-10-17: qty 2

## 2020-10-17 MED ORDER — PREDNISONE 20 MG PO TABS: 20.0000 mg | ORAL_TABLET | Freq: Every day | ORAL | 0 refills | Status: AC

## 2020-10-17 MED ORDER — METOPROLOL SUCCINATE ER 25 MG PO TB24: 25.0000 mg | ORAL_TABLET | Freq: Every day | ORAL | Status: DC

## 2020-10-17 MED ORDER — METOPROLOL SUCCINATE ER 25 MG PO TB24: 25.0000 mg | ORAL_TABLET | Freq: Every day | ORAL | 0 refills | Status: DC

## 2020-10-17 MED ORDER — FUROSEMIDE 20 MG PO TABS: 20.0000 mg | ORAL_TABLET | ORAL | Status: DC

## 2020-10-17 MED ORDER — FUROSEMIDE 20 MG PO TABS: 20.0000 mg | ORAL_TABLET | ORAL | 0 refills | Status: AC

## 2020-10-17 NOTE — Plan of Care (Signed)
  Problem: Education: Goal: Ability to verbalize understanding of medication therapies will improve Outcome: Adequate for Discharge   Problem: Activity: Goal: Capacity to carry out activities will improve Outcome: Adequate for Discharge   Problem: Cardiac: Goal: Ability to achieve and maintain adequate cardiopulmonary perfusion will improve Outcome: Adequate for Discharge

## 2020-10-17 NOTE — Progress Notes (Signed)
Progress Note  Patient Name: Roberta [name] Date of Encounter: 10/17/2020  [facility] Cardiologist: Rockey Situ  Subjective   Scr/BUN improved today. Breathing the same. Euvolemic on exam. Possible discharge.   Inpatient Medications    Scheduled Meds: . arformoterol  15 mcg Nebulization BID  . aspirin EC  81 mg Oral Daily  . atorvastatin  10 mg Oral QHS  . budesonide (PULMICORT) nebulizer solution  0.25 mg Nebulization BID  . Chlorhexidine Gluconate Cloth  6 each Topical Daily  . enoxaparin (LOVENOX) injection  40 mg Subcutaneous Q24H  . feeding supplement  237 mL Oral BID BM  . ipratropium-albuterol  3 mL Nebulization TID  . mouth rinse  15 mL Mouth Rinse BID  . multivitamin with minerals  1 tablet Oral Daily  . nicotine  14 mg Transdermal Daily  . pantoprazole  40 mg Oral Daily  . polyethylene glycol  17 g Oral Daily  . predniSONE  20 mg Oral Q breakfast  . senna-docusate  1 tablet Oral BID  . sodium chloride flush  3 mL Intravenous Q12H  . vitamin B-12  1,000 mcg Oral Daily   Continuous Infusions: . sodium chloride     PRN Meds: sodium chloride, acetaminophen, bisacodyl, ipratropium-albuterol, ondansetron (ZOFRAN) IV, sodium chloride flush, zolpidem   Vital Signs    Vitals:   10/16/20 2103 10/17/20 0409 10/17/20 0718 10/17/20 0722  BP: 126/63 135/69 127/66   Pulse: 97 92 91   Resp:      Temp: 98 F (36.7 C) 97.8 F (36.6 C) 98.7 F (37.1 C)   TempSrc: Oral  Oral   SpO2: 93% 94% 94% 91%  Weight:  64.7 kg    Height:        Intake/Output Summary (Last 24 hours) at 10/17/2020 0811 Last data filed at 10/16/2020 1810 Gross per 24 hour  Intake 840 ml  Output --  Net 840 ml   Last 3 Weights 10/17/2020 10/16/2020 10/15/2020  Weight (lbs) 142 lb 9.6 oz 140 lb 14.4 oz 140 lb 1.6 oz  Weight (kg) 64.683 kg 63.912 kg 63.549 kg      Telemetry    SR/ST HR 90-100, PACs/PVCs - Personally Reviewed  ECG    No new - Personally Reviewed  Physical Exam   GEN:  No acute distress.   Neck: No JVD Cardiac: RRR, no murmurs, rubs, or gallops.  Respiratory: Rhonchii GI: Soft, nontender, non-distended  MS: No edema; No deformity. Neuro:  Nonfocal  Psych: Normal affect   Labs    High Sensitivity Troponin:   Recent Labs  Lab 10/12/20 1112 10/12/20 1318  TROPONINIHS 52* 52*      Chemistry Recent Labs  Lab 10/12/20 1112 10/12/20 2115 10/13/20 0223 10/14/20 0751 10/15/20 0356 10/16/20 0430 10/17/20 0524  NA 125*   < > 124*   < > 132* 132* 133*  K 4.2  --  3.9   < > 3.7 4.1 4.2  CL 89*  --  87*   < > 86* 86* 88*  CO2 25  --  26   < > 34* 35* 34*  GLUCOSE 113*  --  139*   < > 142* 123* 88  BUN 16  --  17   < > 42* 55* 51*  CREATININE 0.82  --  0.88   < > 1.24* 1.51* 1.16*  CALCIUM 8.6*  --  8.6*   < > 8.9 9.1 8.7*  PROT 6.6  --  6.4*  --   --   --   --  ALBUMIN 3.2*  --  3.0*  --   --   --   --   AST 24  --  22  --   --   --   --   ALT 17  --  16  --   --   --   --   ALKPHOS 107  --  94  --   --   --   --   BILITOT 0.8  --  0.8  --   --   --   --   GFRNONAA >60  --  >60   < > 46* 36* 49*  ANIONGAP 11  --  11   < > 12 11 11    < > = values in this interval not displayed.     Hematology Recent Labs  Lab 10/15/20 0356 10/16/20 0430 10/17/20 0524  WBC 14.0* 17.0* 16.6*  RBC 3.58* 3.87 3.72*  HGB 10.5* 11.5* 11.1*  HCT 32.5* 34.6* 33.3*  MCV 90.8 89.4 89.5  MCH 29.3 29.7 29.8  MCHC 32.3 33.2 33.3  RDW 15.0 15.4 15.2  PLT 412* 452* 448*    BNP Recent Labs  Lab 10/12/20 1112  BNP 1,331.8*     DDimer  Recent Labs  Lab 10/12/20 1121  DDIMER 1.00*     Radiology    DG Abd 1 View  Result Date: 10/15/2020 CLINICAL DATA:  Constipation with pain EXAM: ABDOMEN - 1 VIEW COMPARISON:  CT abdomen and pelvis May 04, 2020 FINDINGS: There is diffuse stool throughout the colon. There is no bowel dilatation or air-fluid level to suggest bowel obstruction. No free air. There is an aortic and bilateral iliac artery stent,  unchanged. There are stable pelvic calcifications, likely of vascular etiology. Status post kyphoplasty at L4. IMPRESSION: Diffuse stool throughout colon, likely due to constipation. No bowel obstruction or free air evident. Aorto bi-iliac stent again noted. Electronically Signed   By: Lowella Grip III M.D.   On: 10/15/2020 10:00   DG Chest Port 1 View  Result Date: 10/15/2020 CLINICAL DATA:  Pleural effusion. EXAM: PORTABLE CHEST 1 VIEW COMPARISON:  Chest radiograph dated 10/12/2020. FINDINGS: The heart size is normal. Vascular calcifications are seen in the aortic arch. A left internal jugular central venous port catheter tip overlies the superior cavoatrial junction. A moderate right pleural effusion appears similar to prior exam. Associated atelectasis/airspace disease appears decreased. Mild diffuse bilateral interstitial opacities appear decreased since prior exam. Post radiation changes in the right lung are redemonstrated. Vertebroplasty changes are seen in the thoracic spine. IMPRESSION: No significant change in a moderate right pleural effusion. Decreased bilateral interstitial and airspace opacities. Electronically Signed   By: Zerita Boers M.D.   On: 10/15/2020 15:32    Cardiac Studies    Echo 10/13/20 1. Left ventricular ejection fraction, by estimation, is 25 to 30%. Left  ventricular ejection fraction by 2D MOD biplane is 29.6 %. The left  ventricle has severely decreased function. The left ventricle demonstrates  global hypokinesis. The left  ventricular internal cavity size was mildly to moderately dilated. Left  ventricular diastolic parameters are consistent with Grade II diastolic  dysfunction (pseudonormalization).  2. Right ventricular systolic function is moderately reduced. The right  ventricular size is normal.  3. Left atrial size was moderately dilated.  4. The mitral valve is degenerative. Mild to moderate mitral valve  regurgitation. Severe mitral annular  calcification.  5. The aortic valve is calcified. Aortic valve regurgitation is not  visualized. Moderate  aortic valve stenosis.   Myoview stress test 2018 Negative lexiscan stress, LV function normal, No reversible ischemia   Patient Profile     75 y.o. 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 BNP elevated to 1,331, chest CT with small left pleural effusion, large right pleural effusion, s/p IV lasix.  - Echo showed decreased EF 25-30%, global hypokinesis, mild-mod dilation G2DD, moderately reduced RV function, LA moderately dilated, mild to mod MR,  -was on IV lasix 40mg  daily and lisinopril - Scr/BUn elevated and ACE and lasix held.  - weight down 154>142lbs - euvolemic on exam - Patient is off O2 and feels breathing is better - I will start lopressor 12,5mg  BID. Consider Losartan/Entresto pending kidney function - Due to decreased EF she will need ischemic eval,however can be OP as she is asymptomatic - Since Scr/BUN improved today, can likely go home with lasix 20mg  daily with close follow-up. - CHF educations discussed in detail  Respiratory failure with hypoxia - multifactorial given COPD, CHF - IV lasix for CHF - steroids and azithromycin per IM - She is off supplemental O2  Coronary calcifications noted on CT - No chest pain - started on aspirin 81mg  daily and Atorvastatin 10mg  daily - HS troponin minimally elevated with flat trend - will need ischemic evaluation as above - BB as above  Tobacco use - smoked 1ppd since 75yo.  - plans to continue cessation at discharge  H/o RUL  small cell lung cancer - s/p surgery, chemo, XRT  AAA aneurysm status post endovascular repair in 2018 - can follow with [facility] as OP for overdue imaging  For questions or updates, please contact [place] Please consult [email or web address] for contact info under        Signed, Leonia Heatherly Ninfa Meeker, PA-C  10/17/2020, 8:11 AM

## 2020-10-17 NOTE — TOC Transition Note (Signed)
Transition of Care [facility]) - CM/SW Discharge Note   Patient Details  Name: Roberta [name] MRN: [id number] Date of Birth: 09/09/1945  Transition of Care [facility]) CM/SW Contact:  Izola Price, RN Phone Number: 10/17/2020, 12:50 PM   Clinical Narrative:   Patient being discharged today to Home with [facility] services. Alvis Lemmings was seeing patient PTA and will pick back up services per patient and daughter. Voicemail left with [facility], but patient/family instructed to contact [facility] as well. New HH orders in place for discharge. Patient has all needed DME and someone will be with her at home. Patient/family has no further questions at this time. Simmie Davies RN CM [phone]    Final next level of care: [facility] PTA) Barriers to Discharge: Barriers Resolved   Patient Goals and CMS Choice        Discharge Placement                       Discharge Plan and Services                DME Arranged: N/A (Patient has RW and BSC/3:1 already)   Date DME Agency Contacted: 10/17/20 Time DME Agency Contacted: [phone] Representative spoke with at DME Agency: [place]: RN,PT,OT,Nurse's [place]: [place] Date [place]: 10/17/20 Time [place]: [place] Representative spoke with at [place] (VM/Patient also has contact number for agency to pick back up on discharge from acute care.)  Social Determinants of Health (SDOH) Interventions     Readmission Risk Interventions No flowsheet data found.

## 2020-10-17 NOTE — Discharge Summary (Signed)
Physician Discharge Summary  Roberta [name] RDE:[id number] DOB: 1946/04/21 DOA: 10/12/2020  PCP: Gayland Curry, MD  Admit date: 10/12/2020 Discharge date: 10/17/2020  Admitted From: Home Disposition: Home with home health  Recommendations for Outpatient Follow-up:  1. Follow up with PCP in 1-2 weeks 2. 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 She presented to the emergency room today on account of worsening shortness of breath. Onset of symptoms was 1 day prior to presentation. She has been unable to sleep due to orthopnea and paroxysms of dyspnea. She denied any associated chest pain palpitations nausea or vomiting. Denied any lower extremity edema. She denied any prior history of CHF diagnosis.  Patient started on a regimen of nebulizers, intravenous steroids, diuretics.  Echocardiogram demonstrates systolic congestive heart failure with EF 25 to 30%.  Cardiology consulted.  Case discussed with [facility] cardiology. Patient has improved substantially.  Off supplemental oxygen.  Kidney function improved at time of discharge.  Discussed with cardiology.  Okay with discharge from their standpoint.  They will see patient in the clinic.  On discharge recommend Lasix p.o. 20 mg q. Monday Wednesday and Friday.  Metoprolol succinate 25 mg daily.  Defer addition of ACE inhibitor or Entresto at this time.  Will need follow-up BMP 1 week and further discussion regarding goal-directed medical therapy of new onset heart failure reduced ejection fraction.     Discharge Diagnoses:  Active  Problems:   COPD with acute exacerbation (HCC)  COPD with acute exacerbation Acute hypoxic respiratory failure Patient has longstanding COPD diagnosis Continue to smoke cigarettes Respiratory status improving slowly Weaned to room air at time of discharge Short steroid course prescribed Can resume home inhaler regimen Nicotine patches prescribed For discussion regarding tobacco cessation   Newly diagnosed acute on chronic systolic congestive heart failure Echocardiogram EF 25 to 30% with global hypokinesis May warrant ischemic evaluation Cardiology consulted, [facility] MG Per cardiology ischemic evaluation may be deferred outpatient Was diuresed with Lasix Lasix held the day prior to discharge due to worsening kidney function Kidney function improving at time of discharge Discharge on Lasix p.o. 20 mg every Monday Wednesday Friday Follow-up outpatient cardiology   Acute hyponatremia, resolved Likely secondary to hypervolemia Worsened to 124, now improved to 130, now 132 Likely secondary to volume overload  Acute kidney injury Improved at time of discharge Likely secondary to volume depletion Decreased dose of Lasix prescribed Defer addition of ACE inhibitor/Entresto at this time Can be deferred outpatient discussion  Sinus tachycardia Troponin elevation Patient denies palpitations or chest pain CT angiogram negative for PE Troponin not indicative of ACS  Hyperlipidemia Statin  GERD PPI  History of right upper lobe small cell lung cancer Currently not on chemotherapy Status post surgery, chemotherapy, XRT Outpatient follow-up  Discharge Instructions  Discharge Instructions    Diet - low sodium heart healthy   Complete by: As directed    Increase activity slowly   Complete by: As directed      Allergies as of 10/17/2020   No Known Allergies     Medication List    TAKE these medications   acetaminophen 325 MG tablet Commonly known as:  TYLENOL Take 650 mg by mouth every 6 (  six) hours as needed for mild pain.   albuterol 108 (90 Base) MCG/ACT inhaler Commonly known as: VENTOLIN HFA Inhale 2 puffs into the lungs every 6 (six) hours as needed for wheezing or shortness of breath.   aspirin 81 MG EC tablet Take 1 tablet (81 mg total) by mouth daily. Swallow whole.   atorvastatin 10 MG tablet Commonly known as: LIPITOR Take 10 mg by mouth at bedtime.   dapagliflozin propanediol 10 MG Tabs tablet Commonly known as: FARXIGA Take 1 tablet (10 mg total) by mouth daily.   fluticasone-salmeterol 115-21 MCG/ACT inhaler Commonly known as: ADVAIR HFA Inhale 2 puffs into the lungs 2 (two) times daily.   furosemide 20 MG tablet Commonly known as: LASIX Take 1 tablet (20 mg total) by mouth every Monday, Wednesday, and Friday. Start taking on: Oct 18, 2020   ipratropium 0.02 % nebulizer solution Commonly known as: ATROVENT Take by nebulization.   metoprolol succinate 25 MG 24 hr tablet Commonly known as: TOPROL-XL Take 1 tablet (25 mg total) by mouth daily. Start taking on: Oct 18, 2020   nicotine 14 mg/24hr patch Commonly known as: NICODERM CQ - dosed in mg/24 hours Place 1 patch (14 mg total) onto the skin daily. Start taking on: Oct 18, 2020   omeprazole 20 MG capsule Commonly known as: PRILOSEC Take 1 capsule (20 mg total) by mouth daily.   predniSONE 20 MG tablet Commonly known as: DELTASONE Take 1 tablet (20 mg total) by mouth daily with breakfast for 4 days. Start taking on: Oct 18, 2020   vitamin B-12 1000 MCG tablet Commonly known as: CYANOCOBALAMIN Take 1 tablet (1,000 mcg total) by mouth daily.       No Known Allergies  Consultations:  Cardiology-[facility]   Procedures/Studies: DG Abd 1 View  Result Date: 10/15/2020 CLINICAL DATA:  Constipation with pain EXAM: ABDOMEN - 1 VIEW COMPARISON:  CT abdomen and pelvis May 04, 2020 FINDINGS: There is diffuse stool throughout the colon. There is no  bowel dilatation or air-fluid level to suggest bowel obstruction. No free air. There is an aortic and bilateral iliac artery stent, unchanged. There are stable pelvic calcifications, likely of vascular etiology. Status post kyphoplasty at L4. IMPRESSION: Diffuse stool throughout colon, likely due to constipation. No bowel obstruction or free air evident. Aorto bi-iliac stent again noted. Electronically Signed   By: Lowella Grip III M.D.   On: 10/15/2020 10:00   CT ANGIO CHEST PE W OR WO CONTRAST  Result Date: 10/12/2020 CLINICAL DATA:  Positive D-dimer and shortness of breath EXAM: CT ANGIOGRAPHY CHEST WITH CONTRAST TECHNIQUE: Multidetector CT imaging of the chest was performed using the standard protocol during bolus administration of intravenous contrast. Multiplanar CT image reconstructions and MIPs were obtained to evaluate the vascular anatomy. CONTRAST:  102mL OMNIPAQUE IOHEXOL 350 MG/ML SOLN COMPARISON:  Plain film from earlier in the same day, 09/07/2020 CT. FINDINGS: Cardiovascular: Thoracic aorta demonstrates atherosclerotic calcifications without aneurysmal dilatation or dissection. Coronary calcifications are noted. No significant cardiac enlargement is noted. The pulmonary artery is well visualized within normal branching pattern even on the right where significant post irradiation changes are noted. Mediastinum/Nodes: Thoracic inlet is within normal limits. The esophagus is unremarkable. No sizable hilar or mediastinal adenopathy is noted. Lungs/Pleura: Bilateral pleural effusions are noted right considerably greater than left. Post radiation changes are noted centrally in the right lung. The degree of consolidation is similar to that seen on prior plain film but progressed when compared with the prior CT  examination dated 09/07/2020. Left lung demonstrates some patchy ground-glass airspace opacity likely postinflammatory in nature. Upper Abdomen: Stent graft is noted in the upper abdomen. No  other focal abnormality is seen. Musculoskeletal: Degenerative changes of the thoracic spine are noted. No acute bony abnormality is noted. Review of the MIP images confirms the above findings. IMPRESSION: Increasing consolidation in the right lung when compared with the prior exam representing a combination of previously seen post irradiation changes as well as acute pneumonic infiltrate. Large right-sided pleural effusion is noted. Small left pleural effusion with patchy airspace opacity consistent with multifocal pneumonia. No evidence of pulmonary emboli. Aortic Atherosclerosis (ICD10-I70.0). Electronically Signed   By: Inez Catalina M.D.   On: 10/12/2020 21:54   DG Chest Port 1 View  Result Date: 10/15/2020 CLINICAL DATA:  Pleural effusion. EXAM: PORTABLE CHEST 1 VIEW COMPARISON:  Chest radiograph dated 10/12/2020. FINDINGS: The heart size is normal. Vascular calcifications are seen in the aortic arch. A left internal jugular central venous port catheter tip overlies the superior cavoatrial junction. A moderate right pleural effusion appears similar to prior exam. Associated atelectasis/airspace disease appears decreased. Mild diffuse bilateral interstitial opacities appear decreased since prior exam. Post radiation changes in the right lung are redemonstrated. Vertebroplasty changes are seen in the thoracic spine. IMPRESSION: No significant change in a moderate right pleural effusion. Decreased bilateral interstitial and airspace opacities. Electronically Signed   By: Zerita Boers M.D.   On: 10/15/2020 15:32   DG Chest Portable 1 View  Result Date: 10/12/2020 CLINICAL DATA:  Shortness of breath. Previous history of lung carcinoma and radiation therapy. EXAM: PORTABLE CHEST 1 VIEW COMPARISON:  09/06/2020 FINDINGS: Heart size and mediastinal contours are stable. Extensive pleural-parenchymal scarring and volume loss again seen in the right upper lobe. A new small right pleural effusion is seen, with  atelectasis or infiltrate at the right lung base. Increased diffuse interstitial infiltrates are also seen compared to previous study with Kerley B-lines, suspicious for diffuse interstitial edema. Left-sided Port-A-Cath remains in place. IMPRESSION: New small right pleural effusion, and right basilar atelectasis versus infiltrate. Increased diffuse interstitial infiltrates, suspicious for pulmonary interstitial edema. Stable post radiation changes in right upper hemithorax. Electronically Signed   By: Marlaine Hind M.D.   On: 10/12/2020 11:54   ECHOCARDIOGRAM COMPLETE  Result Date: 10/13/2020    ECHOCARDIOGRAM REPORT   Patient Name:   Roberta [name] Date of Exam: 10/13/2020 Medical Rec #:  [id number]         Height:       67.0 in Accession #:    [id number]        Weight:       154.4 lb Date of Birth:  Sep 27, 1945         BSA:          1.812 m Patient Age:    4 years          BP:           127/83 mmHg Patient Gender: F                 HR:           131 bpm. Exam Location:  [facility] Procedure: 2D Echo, Cardiac Doppler and Color Doppler Indications:     Acute respiratory distress R06.03  History:         Patient has no prior history of Echocardiogram examinations.  COPD. Personal history of chemotherapy, tobacco abuse.  Sonographer:     Sherrie Sport RDCS (AE) Referring Phys:  [id number] [place] Diagnosing Phys: Kate Sable MD  Sonographer Comments: Image acquisition challenging due to mastectomy and Image acquisition challenging due to COPD. IMPRESSIONS  1. Left ventricular ejection fraction, by estimation, is 25 to 30%. Left ventricular ejection fraction by 2D MOD biplane is 29.6 %. The left ventricle has severely decreased function. The left ventricle demonstrates global hypokinesis. The left ventricular internal cavity size was mildly to moderately dilated. Left ventricular diastolic parameters are consistent with Grade II diastolic dysfunction (pseudonormalization).  2. Right  ventricular systolic function is moderately reduced. The right ventricular size is normal.  3. Left atrial size was moderately dilated.  4. The mitral valve is degenerative. Mild to moderate mitral valve regurgitation. Severe mitral annular calcification.  5. The aortic valve is calcified. Aortic valve regurgitation is not visualized. Moderate aortic valve stenosis. FINDINGS  Left Ventricle: Left ventricular ejection fraction, by estimation, is 25 to 30%. Left ventricular ejection fraction by 2D MOD biplane is 29.6 %. The left ventricle has severely decreased function. The left ventricle demonstrates global hypokinesis. The left ventricular internal cavity size was mildly to moderately dilated. There is no left ventricular hypertrophy. Left ventricular diastolic parameters are consistent with Grade II diastolic dysfunction (pseudonormalization). Right Ventricle: The right ventricular size is normal. No increase in right ventricular wall thickness. Right ventricular systolic function is moderately reduced. Left Atrium: Left atrial size was moderately dilated. Right Atrium: Right atrial size was normal in size. Pericardium: There is no evidence of pericardial effusion. Mitral Valve: The mitral valve is degenerative in appearance. There is moderate calcification of the mitral valve leaflet(s). Severe mitral annular calcification. Mild to moderate mitral valve regurgitation. MV peak gradient, 5.2 mmHg. The mean mitral valve gradient is 2.0 mmHg. Tricuspid Valve: The tricuspid valve is not well visualized. Tricuspid valve regurgitation is not demonstrated. Aortic Valve: The aortic valve is calcified. Aortic valve regurgitation is not visualized. Moderate aortic stenosis is present. Aortic valve mean gradient measures 7.3 mmHg. Aortic valve peak gradient measures 13.2 mmHg. Aortic valve area, by VTI measures 1.01 cm. Pulmonic Valve: The pulmonic valve was not well visualized. Pulmonic valve regurgitation is not  visualized. Aorta: The aortic root is normal in size and structure. Venous: The inferior vena cava was not well visualized. IAS/Shunts: No atrial level shunt detected by color flow Doppler.  LEFT VENTRICLE PLAX 2D                        Biplane EF (MOD) LVIDd:         5.53 cm         LV Biplane EF:   Left LVIDs:         4.81 cm                          ventricular LV PW:         0.99 cm                          ejection LV IVS:        0.66 cm                          fraction by LVOT diam:     2.10 cm  2D MOD LV SV:         29                               biplane is LV SV Index:   16                               29.6 %. LVOT Area:     3.46 cm                                Diastology                                LV e' medial:    4.46 cm/s LV Volumes (MOD)               LV E/e' medial:  28.5 LV vol d, MOD    101.0 ml      LV e' lateral:   3.05 cm/s A2C:                           LV E/e' lateral: 41.6 LV vol d, MOD    95.9 ml A4C: LV vol s, MOD    69.3 ml A2C: LV vol s, MOD    64.1 ml A4C: LV SV MOD A2C:   31.7 ml LV SV MOD A4C:   95.9 ml LV SV MOD BP:    29.8 ml RIGHT VENTRICLE RV Basal diam:  2.66 cm RV S prime:     10.40 cm/s TAPSE (M-mode): 2.7 cm LEFT ATRIUM             Index       RIGHT ATRIUM           Index LA diam:        3.40 cm 1.88 cm/m  RA Area:     10.40 cm LA Vol (A2C):   85.9 ml 47.42 ml/m RA Volume:   22.00 ml  12.14 ml/m LA Vol (A4C):   28.4 ml 15.68 ml/m LA Biplane Vol: 54.8 ml 30.25 ml/m  AORTIC VALVE                    PULMONIC VALVE AV Area (Vmax):    1.04 cm     PV Vmax:        0.54 m/s AV Area (Vmean):   1.00 cm     PV Peak grad:   1.1 mmHg AV Area (VTI):     1.01 cm     RVOT Peak grad: 2 mmHg AV Vmax:           181.67 cm/s AV Vmean:          124.333 cm/s AV VTI:            0.283 m AV Peak Grad:      13.2 mmHg AV Mean Grad:      7.3 mmHg LVOT Vmax:         54.80 cm/s LVOT Vmean:        36.000 cm/s LVOT VTI:          0.082 m LVOT/AV VTI ratio: 0.29  AORTA Ao  Root diam: 2.50 cm MITRAL VALVE  TRICUSPID VALVE MV Area (PHT): 6.65 cm     TR Peak grad:   25.6 mmHg MV Area VTI:   1.18 cm     TR Vmax:        253.00 cm/s MV Peak grad:  5.2 mmHg MV Mean grad:  2.0 mmHg     SHUNTS MV Vmax:       1.14 m/s     Systemic VTI:  0.08 m MV Vmean:      67.9 cm/s    Systemic Diam: 2.10 cm MV Decel Time: 114 msec MV E velocity: 127.00 cm/s MV A velocity: 97.70 cm/s MV E/A ratio:  1.30 Kate Sable MD Electronically signed by Kate Sable MD Signature Date/Time: 10/13/2020/5:18:55 PM    Final     (Echo, Carotid, EGD, Colonoscopy, ERCP)    Subjective:   Discharge Exam: Vitals:   10/17/20 0722 10/17/20 1100  BP:  (!) 108/57  Pulse:  92  Resp:  20  Temp:  98 F (36.7 C)  SpO2: 91% 92%   Vitals:   10/17/20 0409 10/17/20 0718 10/17/20 0722 10/17/20 1100  BP: 135/69 127/66  (!) 108/57  Pulse: 92 91  92  Resp:    20  Temp: 97.8 F (36.6 C) 98.7 F (37.1 C)  98 F (36.7 C)  TempSrc:  Oral  Oral  SpO2: 94% 94% 91% 92%  Weight: 64.7 kg     Height:        General: Pt is alert, awake, not in acute distress Cardiovascular: RRR, S1/S2 +, no rubs, no gallops Respiratory: CTA bilaterally, no wheezing, no rhonchi Abdominal: Soft, NT, ND, bowel sounds + Extremities: no edema, no cyanosis    The results of significant diagnostics from this hospitalization (including imaging, microbiology, ancillary and laboratory) are listed below for reference.     Microbiology: Recent Results (from the past 240 hour(s))  Resp Panel by RT-PCR (Flu A&B, Covid) Nasopharyngeal Swab     Status: None   Collection Time: 10/12/20 11:21 AM   Specimen: Nasopharyngeal Swab; Nasopharyngeal(NP) swabs in vial transport medium  Result Value Ref Range Status   SARS Coronavirus 2 by RT PCR NEGATIVE NEGATIVE Final    Comment: (NOTE) SARS-CoV-2 target nucleic acids are NOT DETECTED.  The SARS-CoV-2 RNA is generally detectable in upper respiratory specimens during  the acute phase of infection. The lowest concentration of SARS-CoV-2 viral copies this assay can detect is 138 copies/mL. A negative result does not preclude SARS-Cov-2 infection and should not be used as the sole basis for treatment or other patient management decisions. A negative result may occur with  improper specimen collection/handling, submission of specimen other than nasopharyngeal swab, presence of viral mutation(s) within the areas targeted by this assay, and inadequate number of viral copies(<138 copies/mL). A negative result must be combined with clinical observations, patient history, and epidemiological information. The expected result is Negative.  Fact Sheet for Patients:  EntrepreneurPulse.com.au  Fact Sheet for Healthcare Providers:  IncredibleEmployment.be  This test is no t yet approved or cleared by the [place] FDA and  has been authorized for detection and/or diagnosis of SARS-CoV-2 by FDA under an Emergency Use Authorization (EUA). This EUA will remain  in effect (meaning this test can be used) for the duration of the COVID-19 declaration under Section 564(b)(1) of the Act, 21 U.S.C.section 360bbb-3(b)(1), unless the authorization is terminated  or revoked sooner.       Influenza A by PCR NEGATIVE NEGATIVE Final   Influenza B by  PCR NEGATIVE NEGATIVE Final    Comment: (NOTE) The Xpert Xpress SARS-CoV-2/FLU/RSV plus assay is intended as an aid in the diagnosis of influenza from Nasopharyngeal swab specimens and should not be used as a sole basis for treatment. Nasal washings and aspirates are unacceptable for Xpert Xpress SARS-CoV-2/FLU/RSV testing.  Fact Sheet for Patients: EntrepreneurPulse.com.au  Fact Sheet for Healthcare Providers: IncredibleEmployment.be  This test is not yet approved or cleared by the [place] FDA and has been authorized for detection and/or  diagnosis of SARS-CoV-2 by FDA under an Emergency Use Authorization (EUA). This EUA will remain in effect (meaning this test can be used) for the duration of the COVID-19 declaration under Section 564(b)(1) of the Act, 21 U.S.C. section 360bbb-3(b)(1), unless the authorization is terminated or revoked.  Performed at [facility], [address] [place], [place] [address]   Culture, blood (x 2)     Status: None (Preliminary result)   Collection Time: 10/13/20  2:23 AM   Specimen: BLOOD  Result Value Ref Range Status   Specimen Description BLOOD RIGHT HAND  Final   Special Requests   Final    BOTTLES DRAWN AEROBIC AND ANAEROBIC Blood Culture results may not be optimal due to an inadequate volume of blood received in culture bottles   Culture   Final    NO GROWTH 4 DAYS Performed at [facility], [address] [place], [place] [address]    Report Status PENDING  Incomplete  Culture, blood (x 2)     Status: None (Preliminary result)   Collection Time: 10/13/20  2:24 AM   Specimen: BLOOD  Result Value Ref Range Status   Specimen Description BLOOD RIGHT FA  Final   Special Requests   Final    BOTTLES DRAWN AEROBIC AND ANAEROBIC Blood Culture adequate volume   Culture   Final    NO GROWTH 4 DAYS Performed at [facility], [address] [place],  [address]    Report Status PENDING  Incomplete     Labs: BNP (last 3 results) Recent Labs    10/12/20 1112  BNP 4,970.2*   Basic Metabolic Panel: Recent Labs  Lab 10/13/20 0223 10/14/20 0751 10/15/20 0356 10/16/20 0430 10/17/20 0524  NA 124* 130* 132* 132* 133*  K 3.9 3.8 3.7 4.1 4.2  CL 87* 89* 86* 86* 88*  CO2 26 29 34* 35* 34*  GLUCOSE 139* 151* 142* 123* 88  BUN 17 26* 42* 55* 51*  CREATININE 0.88 0.87 1.24* 1.51* 1.16*  CALCIUM 8.6* 9.0 8.9 9.1 8.7*  MG  --  1.9 2.3 2.2 2.3   Liver Function Tests: Recent Labs  Lab 10/12/20 1112 10/13/20 0223  AST 24 22  ALT 17 16   ALKPHOS 107 94  BILITOT 0.8 0.8  PROT 6.6 6.4*  ALBUMIN 3.2* 3.0*   No results for input(s): LIPASE, AMYLASE in the last 168 hours. No results for input(s): AMMONIA in the last 168 hours. CBC: Recent Labs  Lab 10/13/20 0223 10/14/20 0751 10/15/20 0356 10/16/20 0430 10/17/20 0524  WBC 14.5* 12.1* 14.0* 17.0* 16.6*  NEUTROABS 13.4* 11.2* 13.0* 15.4* 13.5*  HGB 10.4* 10.5* 10.5* 11.5* 11.1*  HCT 31.9* 31.8* 32.5* 34.6* 33.3*  MCV 89.9 90.1 90.8 89.4 89.5  PLT 358 410* 412* 452* 448*   Cardiac Enzymes: No results for input(s): CKTOTAL, CKMB, CKMBINDEX, TROPONINI in the last 168 hours. BNP: Invalid input(s): POCBNP CBG: No results for input(s): GLUCAP in the last 168 hours. D-Dimer No  results for input(s): DDIMER in the last 72 hours. Hgb A1c No results for input(s): HGBA1C in the last 72 hours. Lipid Profile No results for input(s): CHOL, HDL, LDLCALC, TRIG, CHOLHDL, LDLDIRECT in the last 72 hours. Thyroid function studies No results for input(s): TSH, T4TOTAL, T3FREE, THYROIDAB in the last 72 hours.  Invalid input(s): FREET3 Anemia work up No results for input(s): VITAMINB12, FOLATE, FERRITIN, TIBC, IRON, RETICCTPCT in the last 72 hours. Urinalysis    Component Value Date/Time   COLORURINE STRAW (A) 09/06/2020 1954   APPEARANCEUR CLEAR (A) 09/06/2020 1954   LABSPEC 1.011 09/06/2020 1954   PHURINE 7.0 09/06/2020 1954   GLUCOSEU NEGATIVE 09/06/2020 1954   HGBUR SMALL (A) 09/06/2020 [place] NEGATIVE 09/06/2020 [place] NEGATIVE 09/06/2020 1954   PROTEINUR NEGATIVE 09/06/2020 1954   NITRITE NEGATIVE 09/06/2020 1954   LEUKOCYTESUR NEGATIVE 09/06/2020 1954   Sepsis Labs Invalid input(s): PROCALCITONIN,  WBC,  LACTICIDVEN Microbiology Recent Results (from the past 240 hour(s))  Resp Panel by RT-PCR (Flu A&B, Covid) Nasopharyngeal Swab     Status: None   Collection Time: 10/12/20 11:21 AM   Specimen: Nasopharyngeal Swab; Nasopharyngeal(NP) swabs  in vial transport medium  Result Value Ref Range Status   SARS Coronavirus 2 by RT PCR NEGATIVE NEGATIVE Final    Comment: (NOTE) SARS-CoV-2 target nucleic acids are NOT DETECTED.  The SARS-CoV-2 RNA is generally detectable in upper respiratory specimens during the acute phase of infection. The lowest concentration of SARS-CoV-2 viral copies this assay can detect is 138 copies/mL. A negative result does not preclude SARS-Cov-2 infection and should not be used as the sole basis for treatment or other patient management decisions. A negative result may occur with  improper specimen collection/handling, submission of specimen other than nasopharyngeal swab, presence of viral mutation(s) within the areas targeted by this assay, and inadequate number of viral copies(<138 copies/mL). A negative result must be combined with clinical observations, patient history, and epidemiological information. The expected result is Negative.  Fact Sheet for Patients:  EntrepreneurPulse.com.au  Fact Sheet for Healthcare Providers:  IncredibleEmployment.be  This test is no t yet approved or cleared by the [place] FDA and  has been authorized for detection and/or diagnosis of SARS-CoV-2 by FDA under an Emergency Use Authorization (EUA). This EUA will remain  in effect (meaning this test can be used) for the duration of the COVID-19 declaration under Section 564(b)(1) of the Act, 21 U.S.C.section 360bbb-3(b)(1), unless the authorization is terminated  or revoked sooner.       Influenza A by PCR NEGATIVE NEGATIVE Final   Influenza B by PCR NEGATIVE NEGATIVE Final    Comment: (NOTE) The Xpert Xpress SARS-CoV-2/FLU/RSV plus assay is intended as an aid in the diagnosis of influenza from Nasopharyngeal swab specimens and should not be used as a sole basis for treatment. Nasal washings and aspirates are unacceptable for Xpert Xpress  SARS-CoV-2/FLU/RSV testing.  Fact Sheet for Patients: EntrepreneurPulse.com.au  Fact Sheet for Healthcare Providers: IncredibleEmployment.be  This test is not yet approved or cleared by the [place] FDA and has been authorized for detection and/or diagnosis of SARS-CoV-2 by FDA under an Emergency Use Authorization (EUA). This EUA will remain in effect (meaning this test can be used) for the duration of the COVID-19 declaration under Section 564(b)(1) of the Act, 21 U.S.C. section 360bbb-3(b)(1), unless the authorization is terminated or revoked.  Performed at [facility], [address] [place], [place] [address]   Culture, blood (x  2)     Status: None (Preliminary result)   Collection Time: 10/13/20  2:23 AM   Specimen: BLOOD  Result Value Ref Range Status   Specimen Description BLOOD RIGHT HAND  Final   Special Requests   Final    BOTTLES DRAWN AEROBIC AND ANAEROBIC Blood Culture results may not be optimal due to an inadequate volume of blood received in culture bottles   Culture   Final    NO GROWTH 4 DAYS Performed at [facility], [address], [place], [place] [address]    Report Status PENDING  Incomplete  Culture, blood (x 2)     Status: None (Preliminary result)   Collection Time: 10/13/20  2:24 AM   Specimen: BLOOD  Result Value Ref Range Status   Specimen Description BLOOD RIGHT FA  Final   Special Requests   Final    BOTTLES DRAWN AEROBIC AND ANAEROBIC Blood Culture adequate volume   Culture   Final    NO GROWTH 4 DAYS Performed at [facility], [address] [place], [place] [address]    Report Status PENDING  Incomplete     Time coordinating discharge: Over 30 minutes  SIGNED:   Sidney Ace, MD  [facility] 10/17/2020, 12:09 PM Pager   If 7PM-7AM, please contact night-coverage

## 2020-10-17 NOTE — Discharge Instructions (Signed)
Heart Failure, Diagnosis  Heart failure means that your heart is not able to pump blood in the right way. This makes it hard for your body to work well. Heart failure is usually a long-term (chronic) condition. You must take good care of yourself and follow your treatment plan from your doctor. What are the causes?  High blood pressure.  Buildup of cholesterol and fat in the arteries.  Heart attack. This injures the heart muscle.  Heart valves that do not open and close properly.  Damage of the heart muscle. This is also called cardiomyopathy.  Infection of the heart muscle. This is also called myocarditis.  Lung disease. What increases the risk?  Getting older. The risk of heart failure goes up as a person ages.  Being overweight.  Being female.  Use tobacco or nicotine products.  Abusing alcohol or drugs.  Having taken medicines that can damage the heart.  Having any of these conditions: ? Diabetes. ? Abnormal heart rhythms. ? Thyroid problems. ? Low blood counts (anemia).  Having a family history of heart failure. What are the signs or symptoms?  Shortness of breath.  Coughing.  Swelling of the feet, ankles, legs, or belly.  Losing or gaining weight for no reason.  Trouble breathing.  Waking from sleep because of the need to sit up and get more air.  Fast heartbeat.  Being very tired.  Feeling dizzy, or feeling like you may pass out (faint).  Having no desire to eat.  Feeling like you may vomit (nauseous).  Peeing (urinating) more at night.  Feeling confused. How is this treated? This condition may be treated with:  Medicines. These can be given to treat blood pressure and to make the heart muscles stronger.  Changes in your daily life. These may include: ? Eating a healthy diet. ? Staying at a healthy body weight. ? Quitting tobacco, alcohol, and drug use. ? Doing exercises. ? Participating in a cardiac rehabilitation program. This program  helps you improve your health through exercise, education, and counseling.  Surgery. Surgery can be done to open blocked valves, or to put devices in the heart, such as pacemakers.  A donor heart (heart transplant). You will receive a healthy heart from a donor. Follow these instructions at home:  Treat other conditions as told by your doctor. These may include high blood pressure, diabetes, thyroid disease, or abnormal heart rhythms.  Learn as much as you can about heart failure.  Get support as you need it.  Keep all follow-up visits. Summary  Heart failure means that your heart is not able to pump blood in the right way.  This condition is often caused by high blood pressure, heart attack, or damage of the heart muscle.  Symptoms of this condition include shortness of breath and swelling of the feet, ankles, legs, or belly. You may also feel very tired or feel like you may vomit.  You may be treated with medicines, surgery, or changes in your daily life.  Treat other health conditions as told by your doctor. This information is not intended to replace advice given to you by your health care provider. Make sure you discuss any questions you have with your health care provider. Document Revised: 12/06/2019 Document Reviewed: 12/06/2019 Elsevier Patient Education  [place].

## 2020-10-18 LAB — CULTURE, BLOOD (ROUTINE X 2)
Culture: NO GROWTH
Culture: NO GROWTH
Special Requests: ADEQUATE

## 2020-10-24 ENCOUNTER — Other Ambulatory Visit: Payer: Self-pay

## 2020-10-24 ENCOUNTER — Emergency Department: Payer: Medicare HMO

## 2020-10-24 ENCOUNTER — Encounter: Payer: Self-pay | Admitting: Emergency Medicine

## 2020-10-24 ENCOUNTER — Inpatient Hospital Stay
Admission: EM | Admit: 2020-10-24 | Discharge: 2020-10-30 | DRG: 522 | Disposition: A | Discharge status: Skilled Nursing Facility | Payer: Medicare HMO | Attending: Internal Medicine | Admitting: Internal Medicine

## 2020-10-24 DIAGNOSIS — D72828 Other elevated white blood cell count: Secondary | ICD-10-CM | POA: Diagnosis not present

## 2020-10-24 DIAGNOSIS — I5042 Chronic combined systolic (congestive) and diastolic (congestive) heart failure: Secondary | ICD-10-CM | POA: Diagnosis present

## 2020-10-24 DIAGNOSIS — Z66 Do not resuscitate: Secondary | ICD-10-CM | POA: Diagnosis present

## 2020-10-24 DIAGNOSIS — Z85118 Personal history of other malignant neoplasm of bronchus and lung: Secondary | ICD-10-CM

## 2020-10-24 DIAGNOSIS — Z9889 Other specified postprocedural states: Secondary | ICD-10-CM

## 2020-10-24 DIAGNOSIS — I5022 Chronic systolic (congestive) heart failure: Secondary | ICD-10-CM | POA: Diagnosis not present

## 2020-10-24 DIAGNOSIS — S7222XA Displaced subtrochanteric fracture of left femur, initial encounter for closed fracture: Secondary | ICD-10-CM | POA: Diagnosis present

## 2020-10-24 DIAGNOSIS — F17210 Nicotine dependence, cigarettes, uncomplicated: Secondary | ICD-10-CM | POA: Diagnosis present

## 2020-10-24 DIAGNOSIS — E440 Moderate protein-calorie malnutrition: Secondary | ICD-10-CM | POA: Diagnosis present

## 2020-10-24 DIAGNOSIS — S7225XA Nondisplaced subtrochanteric fracture of left femur, initial encounter for closed fracture: Secondary | ICD-10-CM

## 2020-10-24 DIAGNOSIS — K219 Gastro-esophageal reflux disease without esophagitis: Secondary | ICD-10-CM | POA: Diagnosis present

## 2020-10-24 DIAGNOSIS — W1830XA Fall on same level, unspecified, initial encounter: Secondary | ICD-10-CM | POA: Diagnosis present

## 2020-10-24 DIAGNOSIS — Y92009 Unspecified place in unspecified non-institutional (private) residence as the place of occurrence of the external cause: Secondary | ICD-10-CM

## 2020-10-24 DIAGNOSIS — Z853 Personal history of malignant neoplasm of breast: Secondary | ICD-10-CM

## 2020-10-24 DIAGNOSIS — Z9221 Personal history of antineoplastic chemotherapy: Secondary | ICD-10-CM | POA: Diagnosis not present

## 2020-10-24 DIAGNOSIS — E871 Hypo-osmolality and hyponatremia: Secondary | ICD-10-CM | POA: Diagnosis present

## 2020-10-24 DIAGNOSIS — I429 Cardiomyopathy, unspecified: Secondary | ICD-10-CM | POA: Diagnosis present

## 2020-10-24 DIAGNOSIS — Z923 Personal history of irradiation: Secondary | ICD-10-CM

## 2020-10-24 DIAGNOSIS — I255 Ischemic cardiomyopathy: Secondary | ICD-10-CM

## 2020-10-24 DIAGNOSIS — E1169 Type 2 diabetes mellitus with other specified complication: Secondary | ICD-10-CM

## 2020-10-24 DIAGNOSIS — E7800 Pure hypercholesterolemia, unspecified: Secondary | ICD-10-CM | POA: Diagnosis present

## 2020-10-24 DIAGNOSIS — Z720 Tobacco use: Secondary | ICD-10-CM | POA: Diagnosis present

## 2020-10-24 DIAGNOSIS — Z6822 Body mass index (BMI) 22.0-22.9, adult: Secondary | ICD-10-CM | POA: Diagnosis not present

## 2020-10-24 DIAGNOSIS — Z01810 Encounter for preprocedural cardiovascular examination: Secondary | ICD-10-CM | POA: Diagnosis not present

## 2020-10-24 DIAGNOSIS — R52 Pain, unspecified: Secondary | ICD-10-CM | POA: Diagnosis present

## 2020-10-24 DIAGNOSIS — Z7982 Long term (current) use of aspirin: Secondary | ICD-10-CM

## 2020-10-24 DIAGNOSIS — D649 Anemia, unspecified: Secondary | ICD-10-CM | POA: Diagnosis present

## 2020-10-24 DIAGNOSIS — I739 Peripheral vascular disease, unspecified: Secondary | ICD-10-CM | POA: Diagnosis present

## 2020-10-24 DIAGNOSIS — Z79899 Other long term (current) drug therapy: Secondary | ICD-10-CM

## 2020-10-24 DIAGNOSIS — Z9013 Acquired absence of bilateral breasts and nipples: Secondary | ICD-10-CM | POA: Diagnosis not present

## 2020-10-24 DIAGNOSIS — Z902 Acquired absence of lung [part of]: Secondary | ICD-10-CM | POA: Diagnosis not present

## 2020-10-24 DIAGNOSIS — I471 Supraventricular tachycardia: Secondary | ICD-10-CM | POA: Diagnosis present

## 2020-10-24 DIAGNOSIS — S72012A Unspecified intracapsular fracture of left femur, initial encounter for closed fracture: Secondary | ICD-10-CM | POA: Diagnosis not present

## 2020-10-24 DIAGNOSIS — J449 Chronic obstructive pulmonary disease, unspecified: Secondary | ICD-10-CM | POA: Diagnosis present

## 2020-10-24 DIAGNOSIS — S7225XD Nondisplaced subtrochanteric fracture of left femur, subsequent encounter for closed fracture with routine healing: Secondary | ICD-10-CM | POA: Diagnosis not present

## 2020-10-24 DIAGNOSIS — Z8249 Family history of ischemic heart disease and other diseases of the circulatory system: Secondary | ICD-10-CM | POA: Diagnosis not present

## 2020-10-24 DIAGNOSIS — Z20822 Contact with and (suspected) exposure to covid-19: Secondary | ICD-10-CM | POA: Diagnosis present

## 2020-10-24 HISTORY — DX: Chronic combined systolic (congestive) and diastolic (congestive) heart failure: I50.42

## 2020-10-24 HISTORY — DX: Cardiomyopathy, unspecified: I42.9

## 2020-10-24 HISTORY — DX: Nonrheumatic aortic (valve) stenosis: I35.0

## 2020-10-24 HISTORY — DX: Personal history of other medical treatment: Z92.89

## 2020-10-24 HISTORY — DX: Atherosclerotic heart disease of native coronary artery without angina pectoris: I25.10

## 2020-10-24 HISTORY — DX: Nonrheumatic mitral (valve) insufficiency: I34.0

## 2020-10-24 LAB — COMPREHENSIVE METABOLIC PANEL
ALT: 55 U/L — ABNORMAL HIGH (ref 0–44)
AST: 30 U/L (ref 15–41)
Albumin: 3 g/dL — ABNORMAL LOW (ref 3.5–5.0)
Alkaline Phosphatase: 79 U/L (ref 38–126)
Anion gap: 10 (ref 5–15)
BUN: 18 mg/dL (ref 8–23)
CO2: 28 mmol/L (ref 22–32)
Calcium: 8.8 mg/dL — ABNORMAL LOW (ref 8.9–10.3)
Chloride: 95 mmol/L — ABNORMAL LOW (ref 98–111)
Creatinine, Ser: 0.95 mg/dL (ref 0.44–1.00)
GFR, Estimated: >60 mL/min (ref >60)
Glucose, Bld: 104 mg/dL — ABNORMAL HIGH (ref 70–99)
Potassium: 4.1 mmol/L (ref 3.5–5.1)
Sodium: 133 mmol/L — ABNORMAL LOW (ref 135–145)
Total Bilirubin: 0.7 mg/dL (ref 0.3–1.2)
Total Protein: 6.5 g/dL (ref 6.5–8.1)

## 2020-10-24 LAB — CBC WITH DIFFERENTIAL/PLATELET
Abs Immature Granulocytes: 1.08 10*3/uL — ABNORMAL HIGH (ref 0.00–0.07)
Basophils Absolute: 0.1 10*3/uL (ref 0.0–0.1)
Basophils Relative: 0 %
Eosinophils Absolute: 0.1 10*3/uL (ref 0.0–0.5)
Eosinophils Relative: 1 %
HCT: 36.5 % (ref 36.0–46.0)
Hemoglobin: 11.5 g/dL — ABNORMAL LOW (ref 12.0–15.0)
Immature Granulocytes: 6 %
Lymphocytes Relative: 8 %
Lymphs Abs: 1.4 10*3/uL (ref 0.7–4.0)
MCH: 28.8 pg (ref 26.0–34.0)
MCHC: 31.5 g/dL (ref 30.0–36.0)
MCV: 91.5 fL (ref 80.0–100.0)
Monocytes Absolute: 0.6 10*3/uL (ref 0.1–1.0)
Monocytes Relative: 3 %
Neutro Abs: 14.4 10*3/uL — ABNORMAL HIGH (ref 1.7–7.7)
Neutrophils Relative %: 82 %
Platelets: 356 10*3/uL (ref 150–400)
RBC: 3.99 MIL/uL (ref 3.87–5.11)
RDW: 15.1 % (ref 11.5–15.5)
Smear Review: NORMAL
WBC: 17.6 10*3/uL — ABNORMAL HIGH (ref 4.0–10.5)
nRBC: 0 % (ref 0.0–0.2)

## 2020-10-24 LAB — RESP PANEL BY RT-PCR (FLU A&B, COVID) ARPGX2
Influenza A by PCR: NEGATIVE
Influenza B by PCR: NEGATIVE
SARS Coronavirus 2 by RT PCR: NEGATIVE

## 2020-10-24 LAB — PROTIME-INR
INR: 0.9 (ref 0.8–1.2)
Prothrombin Time: 12.5 seconds (ref 11.4–15.2)

## 2020-10-24 LAB — TYPE AND SCREEN
ABO/RH(D): A NEG
Antibody Screen: NEGATIVE

## 2020-10-24 LAB — APTT: aPTT: 23 seconds — ABNORMAL LOW (ref 24–36)

## 2020-10-24 MED ORDER — METHOCARBAMOL 500 MG PO TABS
500.0000 mg | ORAL_TABLET | Freq: Four times a day (QID) | ORAL | Status: DC | PRN
  Administered 2020-10-24 – 2020-10-30 (×3): 500 mg via ORAL
  Filled 2020-10-24 (×4): qty 1

## 2020-10-24 MED ORDER — ALBUTEROL SULFATE (2.5 MG/3ML) 0.083% IN NEBU: 2.5000 mg | INHALATION_SOLUTION | Freq: Four times a day (QID) | RESPIRATORY_TRACT | Status: DC | PRN

## 2020-10-24 MED ORDER — MORPHINE SULFATE (PF) 2 MG/ML IV SOLN
0.5000 mg | INTRAVENOUS | Status: DC | PRN
  Administered 2020-10-24: 0.5 mg via INTRAVENOUS
  Filled 2020-10-24: qty 1

## 2020-10-24 MED ORDER — FUROSEMIDE 20 MG PO TABS
20.0000 mg | ORAL_TABLET | ORAL | Status: DC
  Administered 2020-10-25 – 2020-10-29 (×3): 20 mg via ORAL
  Filled 2020-10-24 (×4): qty 1

## 2020-10-24 MED ORDER — SENNA 8.6 MG PO TABS
1.0000 | ORAL_TABLET | Freq: Two times a day (BID) | ORAL | Status: DC
  Administered 2020-10-24 – 2020-10-30 (×12): 8.6 mg via ORAL
  Filled 2020-10-24 (×12): qty 1

## 2020-10-24 MED ORDER — PANTOPRAZOLE SODIUM 40 MG PO TBEC
40.0000 mg | DELAYED_RELEASE_TABLET | Freq: Every day | ORAL | Status: DC
  Administered 2020-10-24 – 2020-10-30 (×6): 40 mg via ORAL
  Filled 2020-10-24 (×6): qty 1

## 2020-10-24 MED ORDER — MOMETASONE FURO-FORMOTEROL FUM 200-5 MCG/ACT IN AERO
2.0000 | INHALATION_SPRAY | Freq: Two times a day (BID) | RESPIRATORY_TRACT | Status: DC
  Administered 2020-10-24 – 2020-10-30 (×12): 2 via RESPIRATORY_TRACT
  Filled 2020-10-24: qty 8.8

## 2020-10-24 MED ORDER — METOPROLOL TARTRATE 25 MG PO TABS
25.0000 mg | ORAL_TABLET | Freq: Four times a day (QID) | ORAL | Status: DC
  Administered 2020-10-24 – 2020-10-30 (×21): 25 mg via ORAL
  Filled 2020-10-24 (×21): qty 1

## 2020-10-24 MED ORDER — NICOTINE 14 MG/24HR TD PT24
14.0000 mg | MEDICATED_PATCH | Freq: Every day | TRANSDERMAL | Status: DC
  Administered 2020-10-24 – 2020-10-30 (×7): 14 mg via TRANSDERMAL
  Filled 2020-10-24 (×7): qty 1

## 2020-10-24 MED ORDER — ALBUTEROL SULFATE HFA 108 (90 BASE) MCG/ACT IN AERS: 2.0000 | INHALATION_SPRAY | Freq: Four times a day (QID) | RESPIRATORY_TRACT | Status: DC | PRN

## 2020-10-24 MED ORDER — DAPAGLIFLOZIN PROPANEDIOL 5 MG PO TABS
10.0000 mg | ORAL_TABLET | Freq: Every day | ORAL | Status: DC
  Administered 2020-10-24 – 2020-10-30 (×6): 10 mg via ORAL
  Filled 2020-10-24 (×8): qty 2

## 2020-10-24 MED ORDER — HYDROCODONE-ACETAMINOPHEN 5-325 MG PO TABS
1.0000 | ORAL_TABLET | Freq: Four times a day (QID) | ORAL | Status: DC | PRN
  Administered 2020-10-24 (×2): 1 via ORAL
  Administered 2020-10-25 – 2020-10-27 (×5): 2 via ORAL
  Administered 2020-10-28 – 2020-10-30 (×7): 1 via ORAL
  Filled 2020-10-24: qty 2
  Filled 2020-10-24 (×2): qty 1
  Filled 2020-10-24: qty 2
  Filled 2020-10-24 (×5): qty 1
  Filled 2020-10-24: qty 2
  Filled 2020-10-24: qty 1
  Filled 2020-10-24 (×2): qty 2
  Filled 2020-10-24: qty 1
  Filled 2020-10-24: qty 2

## 2020-10-24 MED ORDER — METOPROLOL SUCCINATE ER 50 MG PO TB24
25.0000 mg | ORAL_TABLET | Freq: Every day | ORAL | Status: DC
  Administered 2020-10-24: 25 mg via ORAL
  Filled 2020-10-24: qty 1

## 2020-10-24 MED ORDER — METHOCARBAMOL 1000 MG/10ML IJ SOLN
500.0000 mg | Freq: Four times a day (QID) | INTRAVENOUS | Status: DC | PRN
  Filled 2020-10-24: qty 5

## 2020-10-24 MED ORDER — VITAMIN B-12 1000 MCG PO TABS
1000.0000 ug | ORAL_TABLET | Freq: Every day | ORAL | Status: DC
  Administered 2020-10-24 – 2020-10-30 (×6): 1000 ug via ORAL
  Filled 2020-10-24 (×6): qty 1

## 2020-10-24 MED ORDER — ACETAMINOPHEN 325 MG PO TABS
650.0000 mg | ORAL_TABLET | Freq: Four times a day (QID) | ORAL | Status: DC | PRN
  Administered 2020-10-24 – 2020-10-30 (×8): 650 mg via ORAL
  Filled 2020-10-24 (×8): qty 2

## 2020-10-24 MED ORDER — MORPHINE SULFATE (PF) 4 MG/ML IV SOLN
4.0000 mg | INTRAVENOUS | Status: AC | PRN
  Administered 2020-10-24 (×2): 4 mg via INTRAVENOUS
  Filled 2020-10-24 (×2): qty 1

## 2020-10-24 MED ORDER — ATORVASTATIN CALCIUM 10 MG PO TABS
10.0000 mg | ORAL_TABLET | Freq: Every day | ORAL | Status: DC
  Administered 2020-10-24 – 2020-10-29 (×6): 10 mg via ORAL
  Filled 2020-10-24 (×7): qty 1

## 2020-10-24 NOTE — Consult Note (Signed)
Cardiology Consult    Patient ID: JUDITHE KEETCH MRN: [id number], DOB/AGE: 1946-05-08   Admit date: 10/24/2020 Date of Consult: 10/24/2020  Primary Physician: Gayland Curry, MD Primary Cardiologist: Ida Rogue, MD Requesting Provider: Milon Dikes, MD  Patient Profile    Roberta [name] is a 75 y.o. female with a history of recently dx cardiomyopathy of uncertain etiology, tob abuse, COPD, lung cancer, mod AS, MR, HL, anemia, AAA s/p endovascular repair, and breast cancer, who is being seen today for the evaluation of preop eval in the setting of L subcaptial hip fracture at the request of Dr. Francine Graven.  Past Medical History   Past Medical History:  Diagnosis Date  . AAA (abdominal aortic aneurysm) ([place])   . Anemia   . Breast cancer ([place]) 2002   left  . Cardiomyopathy ([place])    a. 09/2020 Echo: EF 25-30%, glob HK, Gr2 DD, mod reduced RV fxn. Mod dil LA. Mild to mod MR. Mod AS.  Marland Kitchen Chronic combined systolic and diastolic CHF (congestive heart failure) ([place])    a. 09/2020 Echo: EF 25-30%, glob HK, gr2 DD.  Marland Kitchen COPD (chronic obstructive pulmonary disease) ([place])   . Coronary artery calcification seen on CT scan   . Dyspnea   . Headache   . High cholesterol   . History of stress test    a. 10/2016 Low risk MV. EF 60%.  . Mitral regurgitation   . Moderate aortic stenosis   . Personal history of chemotherapy   . Small cell lung cancer ([place])   . Tobacco abuse     Past Surgical History:  Procedure Laterality Date  . ABDOMINAL HYSTERECTOMY    . ARTERY BIOPSY Right 12/05/2017   Procedure: BIOPSY TEMPORAL ARTERY;  Surgeon: Margaretha Sheffield, MD;  Location: [facility] ORS;  Service: ENT;  Laterality: Right;  . ENDOBRONCHIAL ULTRASOUND N/A 02/19/2017   Procedure: ENDOBRONCHIAL ULTRASOUND;  Surgeon: Flora Lipps, MD;  Location: [facility] ORS;  Service: Cardiopulmonary;  Laterality: N/A;  . ENDOVASCULAR REPAIR/STENT GRAFT N/A 12/06/2016   Procedure: Endovascular Repair/Stent Graft;  Surgeon: Katha Cabal, MD;  Location: [facility];  Service: Cardiovascular;  Laterality: N/A;  . IR FLUORO GUIDE PORT INSERTION RIGHT  03/02/2017  . KYPHOPLASTY N/A 06/11/2018   Procedure: KYPHOPLASTY L4 BIOPSY WITH RFA;  Surgeon: Hessie Knows, MD;  Location: [facility] ORS;  Service: Orthopedics;  Laterality: N/A;  . KYPHOPLASTY N/A 12/16/2019   Procedure: T6 KYPHOPLASTY;  Surgeon: Hessie Knows, MD;  Location: [facility] ORS;  Service: Orthopedics;  Laterality: N/A;  . MASTECTOMY Left 2002   with sentinel node      Allergies  No Known Allergies  History of Present Illness    75 y/o ? with a history of recently dx cardiomyopathy of uncertain etiology, tob abuse, COPD, lung cancer, mod AS, MR, HL, anemia, AAA s/p endovascular repair, and breast cancer.  She previously underwent stress testing in June 2018, which showed normal LV function and no evidence of ischemia or infarct.  She subsequently underwent endovascular repair of her abdominal aortic aneurysm with aortic, iliac, and right renal artery stenting.  Patient smoked up until about 2 weeks ago, when she was admitted to [facility] on May 17 with progressive dyspnea and orthopnea.  Imaging showed pulmonary edema and a right pleural effusion.  She was admitted for diuresis.  Echo showed an EF of 25 to 30%.  Troponins were flat at 52.  She was aggressively diuresed and subsequently discharged to home with home  health PT on May 22 on beta-blocker therapy.  Wilder Glade and Lasix 20 mg on Mondays, Wednesdays, and Fridays was also prescribed.  ACE/ARB/Entresto/MRA were avoided in the setting of soft blood pressures.  Patient says following discharge, she was doing well.  She has remained off of cigarettes and has been weighing herself daily without any change in weight.  She denies chest pain, dyspnea, palpitations, PND, orthopnea, syncope, edema, or early satiety.  Unfortunately, early this morning, she got out of bed to use her bedside commode.  Her foot slipped  and came out from under her and she struck the wall and then fell to the floor.  This was followed immediately by severe left hip and leg pain.  EMS was called and she was taken to the ED where she was found to have a left subcapital hip fracture.  She has been seen by Ortho with plan for surgery this admission.  Her only complaint at this time is left hip pain.  Activity at baseline is quite limited.  She says she walks in her house or on her deck for about 4 minutes a day.  As noted, she does not experience chest pain with activity.  Inpatient Medications    . atorvastatin  10 mg Oral QHS  . dapagliflozin propanediol  10 mg Oral Daily  . [START ON 10/25/2020] furosemide  20 mg Oral Q M,W,F  . metoprolol tartrate  25 mg Oral Q6H  . mometasone-formoterol  2 puff Inhalation BID  . nicotine  14 mg Transdermal Daily  . pantoprazole  40 mg Oral Daily  . senna  1 tablet Oral BID  . vitamin B-12  1,000 mcg Oral Daily    Family History    Family History  Problem Relation Age of Onset  . CAD Mother   . Colon cancer Brother   . Breast cancer Neg Hx    She indicated that the status of her mother is unknown. She indicated that the status of her brother is unknown. She indicated that the status of her neg hx is unknown.   Social History    Social History   Socioeconomic History  . Marital status: Widowed    Spouse name: Not on file  . Number of children: Not on file  . Years of education: Not on file  . Highest education level: Not on file  Occupational History  . Not on file  Tobacco Use  . Smoking status: Current Every Day Smoker    Packs/day: 1.50    Years: 50.00    Pack years: 75.00    Types: Cigarettes  . Smokeless tobacco: Never Used  Vaping Use  . Vaping Use: Never used  Substance and Sexual Activity  . Alcohol use: No  . Drug use: No  . Sexual activity: Never  Other Topics Concern  . Not on file  Social History Narrative  . Not on file   Social Determinants of  Health   Financial Resource Strain: Not on file  Food Insecurity: Not on file  Transportation Needs: Not on file  Physical Activity: Not on file  Stress: Not on file  Social Connections: Not on file  Intimate Partner Violence: Not on file     Review of Systems    General:  No chills, fever, night sweats or weight changes.  Cardiovascular:  No chest pain, +++ some degree of chronic dyspnea on exertion, no edema, orthopnea, palpitations, paroxysmal nocturnal dyspnea. Dermatological: No rash, lesions/masses Respiratory: No cough, some degree  of chronic dyspnea on exertion Urologic: No hematuria, dysuria Abdominal:   No nausea, vomiting, diarrhea, bright red blood per rectum, melena, or hematemesis Neurologic:  No visual changes, wkns, changes in mental status. Musculoskeletal: Severe left hip pain currently All other systems reviewed and are otherwise negative except as noted above.  Physical Exam    Blood pressure (!) 141/69, pulse (!) 113, temperature 97.8 F (36.6 C), temperature source Oral, resp. rate (!) 26, height 5\' 7"  (1.702 m), weight 64.7 kg, SpO2 94 %.  General: Pleasant, NAD Psych: Normal affect. Neuro: Alert and oriented X 3. Moves all extremities spontaneously. HEENT: Normal with the exception of being very hard of hearing. Neck: Supple without bruits or JVD. Lungs:  Resp regular and unlabored, CTA. Heart: RRR, 2/6 systolic murmur throughout, no s3, s4, or murmurs. Abdomen: Soft, non-tender, non-distended, BS + x 4.  Extremities: No clubbing, cyanosis or edema. DP/PT 1+, Radials 2+ and equal bilaterally.  Labs    Cardiac Enzymes Recent Labs  Lab 10/12/20 1112 10/12/20 1318  TROPONINIHS 52* 52*      Lab Results  Component Value Date   WBC 17.6 (H) 10/24/2020   HGB 11.5 (L) 10/24/2020   HCT 36.5 10/24/2020   MCV 91.5 10/24/2020   PLT 356 10/24/2020    Recent Labs  Lab 10/24/20 0459  NA 133*  K 4.1  CL 95*  CO2 28  BUN 18  CREATININE 0.95   CALCIUM 8.8*  PROT 6.5  BILITOT 0.7  ALKPHOS 79  ALT 55*  AST 30  GLUCOSE 104*   Lab Results  Component Value Date   CHOL 162 09/08/2020   HDL 49 09/08/2020   LDLCALC 93 09/08/2020   TRIG 98 09/08/2020   Lab Results  Component Value Date   DDIMER 1.00 (H) 10/12/2020     Radiology Studies   DG Chest Port 1 View  Result Date: 10/24/2020 CLINICAL DATA:  Preop left hip pain EXAM: PORTABLE CHEST 1 VIEW COMPARISON:  10/15/2020 FINDINGS: Radiation changes in the right upper lobe with upper hilar retraction. Small right pleural effusion is improved. Left lung is clear.  No pneumothorax. The heart is normal in size. Left chest power port terminates the cavoatrial junction. Midthoracic vertebral augmentation. IMPRESSION: Radiation changes in the right hemithorax. Electronically Signed   By: Julian Hy M.D.   On: 10/24/2020 06:25   DG HIP UNILAT W OR W/O PELVIS 2-3 VIEWS LEFT  Result Date: 10/24/2020 CLINICAL DATA:  Fall, left hip pain EXAM: DG HIP (WITH OR WITHOUT PELVIS) 2-3V LEFT COMPARISON:  None. FINDINGS: Subcapital left hip fracture. Mild foreshortening and varus angulation. Bilateral hip joint spaces are preserved. Visualized bony pelvis appears intact. Degenerative changes of the lower lumbar spine. Bilateral iliac stents. IMPRESSION: Subcapital left hip fracture, as above. Electronically Signed   By: Julian Hy M.D.   On: 10/24/2020 06:26   ECG & Cardiac Imaging    RSR, 93, PVCs, biatrial enlargement, LVH - personally reviewed.  Assessment & Plan    1.  Preoperative cardiovascular evaluation/cardiomyopathy/chronic combined systolic and diastolic congestive heart failure: Patient with low risk stress testing in 2018 who was recently admitted on May 17 with progressive dyspnea and found to have new LV dysfunction with an EF of 25 to 30%.  She was aggressively diuresed with good response and symptomatic improvement.  Blood pressures were soft and limited US to using  beta-blocker and oral Lasix only (Monday Wednesday Friday on Lasix).  Plan was for eventual ischemic evaluation  including diagnostic catheterization with high likelihood of presence of coronary disease in the setting of known PAD and coronary calcification on recent CT.  Patient has been stable at home without chest pain.  She does have some degree of chronic dyspnea on exertion in the setting of tobacco abuse and COPD.  She slipped when trying to use the commode this morning and fell with subsequent left hip pain and imaging revealing a left subcapital hip fracture.  At baseline, she is very sedentary but does not typically experience anginal symptoms.  In the absence of current symptoms, she is at an acceptable risk for proceeding with hip surgery prior to pursuing ischemic evaluation (1% to 6% risk of major cardiac event when considering likelihood of presence of CAD).  Heart rate is elevated with frequent PVCs on telemetry in the setting of pain and low EF.  She has been taking metoprolol XL 25 mg daily at home.  As she is at risk for perioperative arrhythmias, we will change this to metoprolol tartrate 25 mg every 6 hours.  We will have to be careful with volume throughout the perioperative process given LV dysfunction.  Continue SGLT2 inhibitor, furosemide, and statin.  2.  Left subcapital hip fracture: Seen by Ortho with plan for surgery this admission.  See #1.  3.  COPD/tobacco abuse: Has not smoked since last hospitalization.  Not actively wheezing.  Inhaler therapy per medicine team.  4.  History of abdominal aortic aneurysm status post endovascular repair with iliac and right renal artery stenting: Followed by vascular surgery in the outpatient setting.  Continue statin therapy.  Signed, Murray Hodgkins, NP 10/24/2020, 2:10 PM  For questions or updates, please contact   Please consult [email or web address] for contact info under Cardiology/STEMI.

## 2020-10-24 NOTE — Consult Note (Signed)
ORTHOPAEDIC CONSULTATION  REQUESTING PHYSICIAN: Collier Bullock, MD  Chief Complaint: Left femoral neck fracture  HPI: Roberta [name] is a 75 y.o. female who complains of left hip pain after mechanical fall at home overnight.  X-rays were obtained in the ER which showed presence of a subcapital left femoral neck fracture.  Orthopedics was consulted regarding further management.  Patient was recently hospitalized for a week given systolic congestive heart failure.  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  Patient ambulates with the use of a cane.  She is not on any anticoagulation.  She lives at home with her daughter.  Past Medical History:  Diagnosis Date  . AAA (abdominal aortic aneurysm) ([place])   . Anemia   . Breast cancer ([place]) 2002   left  . COPD (chronic obstructive pulmonary disease) ([place])   . Dyspnea   . Headache   . High cholesterol   . History of stress test    a. 10/2016 Low risk MV. EF 60%.  . Personal history of chemotherapy   . Small cell lung cancer ([place])   . Tobacco abuse    Past Surgical History:  Procedure Laterality Date  . ABDOMINAL HYSTERECTOMY    . ARTERY BIOPSY Right 12/05/2017   Procedure: BIOPSY TEMPORAL ARTERY;  Surgeon: Margaretha Sheffield, MD;  Location: [facility];  Service: ENT;  Laterality: Right;  . ENDOBRONCHIAL ULTRASOUND N/A 02/19/2017   Procedure: ENDOBRONCHIAL ULTRASOUND;  Surgeon: Flora Lipps, MD;  Location: [facility] OR[facility];  Service: Cardiopulmonary;  Laterality: N/A;  . ENDOVASCULAR REPAIR/STENT GRAFT N/A 12/06/2016   Procedure: Endovascular Repair/Stent Graft;  Surgeon: Katha Cabal, MD;  Location: [place] [facility];  Service: Cardiovascular;  Laterality: N/A;  . IR FLUORO GUIDE PORT INSERTION RIGHT   03/02/2017  . KYPHOPLASTY N/A 06/11/2018   Procedure: KYPHOPLASTY L4 BIOPSY WITH RFA;  Surgeon: Hessie Knows, MD;  Location: [facility];  Service: Orthopedics;  Laterality: N/A;  . KYPHOPLASTY N/A 12/16/2019   Procedure: T6 KYPHOPLASTY;  Surgeon: Hessie Knows, MD;  Location: [facility];  Service: Orthopedics;  Laterality: N/A;  . MASTECTOMY Left 2002   with sentinel node    Social History   Socioeconomic History  . Marital status: Widowed    Spouse name: Not on file  . Number of children: Not on file  . Years of education: Not on file  . Highest education level: Not on file  Occupational History  . Not on file  Tobacco Use  . Smoking status: Current Every Day Smoker    Packs/day: 1.50    Years: 50.00    Pack years: 75.00    Types: Cigarettes  . Smokeless tobacco: Never Used  Vaping Use  . Vaping Use: Never used  Substance and Sexual Activity  . Alcohol use: No  . Drug use: No  . Sexual activity: Never  Other Topics Concern  . Not on file  Social History Narrative  . Not on file   Social Determinants of Health   Financial Resource Strain: Not on file  Food Insecurity: Not on file  Transportation Needs: Not on file  Physical Activity: Not on file  Stress: Not on file  Social Connections: Not on file   Family History  Problem Relation Age of Onset  . CAD Mother   . Colon cancer Brother   .  Breast cancer Neg Hx    No Known Allergies Prior to Admission medications   Medication Sig Start Date End Date Taking? Authorizing Provider  ipratropium (ATROVENT) 0.02 % nebulizer solution Take 3 mLs by nebulization in the morning, at noon, in the evening, and at bedtime. 10/07/20 10/07/21 Yes [provider]  acetaminophen (TYLENOL) 325 MG tablet Take 650 mg by mouth every 6 (six) hours as needed for mild pain.     [provider]  albuterol (PROVENTIL HFA;VENTOLIN HFA) 108 (90 Base) MCG/ACT inhaler Inhale 2 puffs into the lungs every 6 (six) hours as needed for  wheezing or shortness of breath. 03/23/16   Frederich Cha, MD  aspirin EC 81 MG EC tablet Take 1 tablet (81 mg total) by mouth daily. Swallow whole. 09/09/20   Bonnielee Haff, MD  atorvastatin (LIPITOR) 10 MG tablet Take 10 mg by mouth at bedtime.  08/11/16   [provider]  dapagliflozin propanediol (FARXIGA) 10 MG TABS tablet Take 1 tablet (10 mg total) by mouth daily. 10/17/20 11/16/20  Sidney Ace, MD  fluticasone-salmeterol (ADVAIR HFA) 884-16 MCG/ACT inhaler Inhale 2 puffs into the lungs 2 (two) times daily.    [provider]  furosemide (LASIX) 20 MG tablet Take 1 tablet (20 mg total) by mouth every Monday, Wednesday, and Friday. 10/18/20   Sidney Ace, MD  metoprolol succinate (TOPROL-XL) 25 MG 24 hr tablet Take 1 tablet (25 mg total) by mouth daily. 10/18/20 11/17/20  Sidney Ace, MD  nicotine (NICODERM CQ - DOSED IN MG/24 HOURS) 14 mg/24hr patch Place 1 patch (14 mg total) onto the skin daily. 10/18/20   Sidney Ace, MD  omeprazole (PRILOSEC) 20 MG capsule Take 1 capsule (20 mg total) by mouth daily. 09/27/20   Jacquelin Hawking, NP  vitamin B-12 (CYANOCOBALAMIN) 1000 MCG tablet Take 1 tablet (1,000 mcg total) by mouth daily. 09/09/20   Bonnielee Haff, MD   DG Chest Port 1 View  Result Date: 10/24/2020 CLINICAL DATA:  Preop left hip pain EXAM: PORTABLE CHEST 1 VIEW COMPARISON:  10/15/2020 FINDINGS: Radiation changes in the right upper lobe with upper hilar retraction. Small right pleural effusion is improved. Left lung is clear.  No pneumothorax. The heart is normal in size. Left chest power port terminates the cavoatrial junction. Midthoracic vertebral augmentation. IMPRESSION: Radiation changes in the right hemithorax. Electronically Signed   By: Julian Hy M.D.   On: 10/24/2020 06:25   DG HIP UNILAT W OR W/O PELVIS 2-3 VIEWS LEFT  Result Date: 10/24/2020 CLINICAL DATA:  Fall, left hip pain EXAM: DG HIP (WITH OR WITHOUT PELVIS) 2-3V LEFT  COMPARISON:  None. FINDINGS: Subcapital left hip fracture. Mild foreshortening and varus angulation. Bilateral hip joint spaces are preserved. Visualized bony pelvis appears intact. Degenerative changes of the lower lumbar spine. Bilateral iliac stents. IMPRESSION: Subcapital left hip fracture, as above. Electronically Signed   By: Julian Hy M.D.   On: 10/24/2020 06:26    Positive ROS: All other systems have been reviewed and were otherwise negative with the exception of those mentioned in the HPI and as above.  Physical Exam: General: Alert, no acute distress Cardiovascular: No pedal edema Respiratory: No cyanosis, no use of accessory musculature GI: No organomegaly, abdomen is soft and non-tender Skin: No lesions in the area of chief complaint Neurologic: Sensation intact distally Psychiatric: Patient is competent for consent with normal mood and affect Lymphatic: No axillary or cervical lymphadenopathy  MUSCULOSKELETAL:  Left lower extremity: Tenderness  to palpation about the left hip, no tenderness about the knee or ankle, 2+ DP pulse, appropriate cap refill, left lower extremity is motor and sensory intact  Assessment: 75 year old female with multiple medical comorbidities including systolic CHF admitted with a displaced left femoral neck fracture.  Plan: -Appreciate hospitalist and multidisciplinary team support -Given the patient's displaced femoral neck fracture, we had a long discussion regarding risks and benefits of operative and nonoperative treatment options.  Recommendation was made for left hip hemiarthroplasty once patient is cleared from a medical perspective.  I will plan to speak with the patient's daughter as well to outline treatment options.   Renee Harder, MD   10/24/2020 9:19 AM

## 2020-10-24 NOTE — ED Provider Notes (Signed)
[facility] Emergency Department Provider Note  ____________________________________________   Event Date/Time   First MD Initiated Contact with Patient 10/24/20 [id number]     (approximate)  I have reviewed the triage vital signs and the nursing notes.   HISTORY  Chief Complaint Fall  Level 5 caveat:  history/ROS limited by acute/critical illness  HPI Roberta [name] is a 75 y.o. female with extensive medical history as listed below who presents by EMS for evaluation of acute onset pain in her left hip after a fall.  Reportedly she got up to go to the bathroom tonight and became overbalanced and fell onto her left side when getting up from the toilet.  She does not believe that she lost consciousness.  He has no pain in her head or her neck.  She denies chest pain or shortness of breath and abdominal pain.  She reports pain in her left hip with any amount of movement.  She has no numbness nor tingling.  Moving around makes it worse and nothing particular makes it better.         Past Medical History:  Diagnosis Date  . AAA (abdominal aortic aneurysm) ([place])   . Anemia   . Breast cancer ([employer]) 2002   left  . COPD (chronic obstructive pulmonary disease) ([place])   . Dyspnea   . Headache   . High cholesterol   . Personal history of chemotherapy   . Small cell lung cancer ([place])   . Tobacco abuse     Patient Active Problem List   Diagnosis Date Noted  . Closed left subtrochanteric femur fracture ([place]) 10/24/2020  . COPD with acute exacerbation ([place]) 10/12/2020  . Acute metabolic encephalopathy 24/58/0998  . Elevated troponin 09/07/2020  . AKI (acute kidney injury) ([place]) 09/07/2020  . GERD (gastroesophageal reflux disease) 09/07/2020  . Tobacco abuse 09/07/2020  . Malignant neoplasm of unspecified part of unspecified bronchus or lung ([place]) 02/22/2020  . Cough 02/12/2020  . Rib pain on left side 11/29/2019  . Compression fracture of T6 vertebra ([place])  11/05/2019  . Weight loss 07/29/2019  . Age-related osteoporosis without current pathological fracture 05/21/2019  . Compression fracture of T5 vertebra ([place]) 05/06/2019  . Port-A-Cath in place 05/06/2019  . Closed stable burst fracture of fourth lumbar vertebra ([place]) 06/08/2018  . Severe protein-calorie malnutrition ([place]) 10/29/2017  . Sepsis ([place]) 04/28/2017  . Small cell lung cancer, right upper lobe ([place]) 02/23/2017  . Goals of care, counseling/discussion 02/23/2017  . Lung mass   . AAA (abdominal aortic aneurysm) without rupture ([place]) 11/02/2016  . COPD mixed type ([place]) 11/02/2016  . Hyperlipidemia 11/02/2016  . PAD (peripheral artery disease) ([place]) 11/02/2016    Past Surgical History:  Procedure Laterality Date  . ABDOMINAL HYSTERECTOMY    . ARTERY BIOPSY Right 12/05/2017   Procedure: BIOPSY TEMPORAL ARTERY;  Surgeon: Margaretha Sheffield, MD;  Location: [facility];  Service: ENT;  Laterality: Right;  . ENDOBRONCHIAL ULTRASOUND N/A 02/19/2017   Procedure: ENDOBRONCHIAL ULTRASOUND;  Surgeon: Flora Lipps, MD;  Location: [facility];  Service: Cardiopulmonary;  Laterality: N/A;  . ENDOVASCULAR REPAIR/STENT GRAFT N/A 12/06/2016   Procedure: Endovascular Repair/Stent Graft;  Surgeon: Katha Cabal, MD;  Location: [place] [facility];  Service: Cardiovascular;  Laterality: N/A;  . IR FLUORO GUIDE PORT INSERTION RIGHT  03/02/2017  . KYPHOPLASTY N/A 06/11/2018   Procedure: KYPHOPLASTY L4 BIOPSY WITH RFA;  Surgeon: Hessie Knows, MD;  Location: [facility];  Service: Orthopedics;  Laterality: N/A;  .  KYPHOPLASTY N/A 12/16/2019   Procedure: T6 KYPHOPLASTY;  Surgeon: Hessie Knows, MD;  Location: [facility];  Service: Orthopedics;  Laterality: N/A;  . MASTECTOMY Left 2002   with sentinel node     Prior to Admission medications   Medication Sig Start Date End Date Taking? Authorizing Provider  acetaminophen (TYLENOL) 325 MG tablet Take 650 mg by mouth every 6 (six) hours as needed for mild pain.      [provider]  albuterol (PROVENTIL HFA;VENTOLIN HFA) 108 (90 Base) MCG/ACT inhaler Inhale 2 puffs into the lungs every 6 (six) hours as needed for wheezing or shortness of breath. 03/23/16   Frederich Cha, MD  aspirin EC 81 MG EC tablet Take 1 tablet (81 mg total) by mouth daily. Swallow whole. 09/09/20   Bonnielee Haff, MD  atorvastatin (LIPITOR) 10 MG tablet Take 10 mg by mouth at bedtime.  08/11/16   [provider]  dapagliflozin propanediol (FARXIGA) 10 MG TABS tablet Take 1 tablet (10 mg total) by mouth daily. 10/17/20 11/16/20  Sidney Ace, MD  fluticasone-salmeterol (ADVAIR HFA) 094-70 MCG/ACT inhaler Inhale 2 puffs into the lungs 2 (two) times daily.    [provider]  furosemide (LASIX) 20 MG tablet Take 1 tablet (20 mg total) by mouth every Monday, Wednesday, and Friday. 10/18/20   Sreenath, Sudheer B, MD  ipratropium (ATROVENT) 0.02 % nebulizer solution Take by nebulization. 10/07/20   [provider]  metoprolol succinate (TOPROL-XL) 25 MG 24 hr tablet Take 1 tablet (25 mg total) by mouth daily. 10/18/20 11/17/20  Sidney Ace, MD  nicotine (NICODERM CQ - DOSED IN MG/24 HOURS) 14 mg/24hr patch Place 1 patch (14 mg total) onto the skin daily. 10/18/20   Sidney Ace, MD  omeprazole (PRILOSEC) 20 MG capsule Take 1 capsule (20 mg total) by mouth daily. 09/27/20   Jacquelin Hawking, NP  vitamin B-12 (CYANOCOBALAMIN) 1000 MCG tablet Take 1 tablet (1,000 mcg total) by mouth daily. 09/09/20   Bonnielee Haff, MD    Allergies Patient has no known allergies.  Family History  Problem Relation Age of Onset  . CAD Mother   . Colon cancer Brother   . Breast cancer Neg Hx     Social History Social History   Tobacco Use  . Smoking status: Current Every Day Smoker    Packs/day: 1.50    Years: 50.00    Pack years: 75.00    Types: Cigarettes  . Smokeless tobacco: Never Used  Vaping Use  . Vaping Use: Never used  Substance Use Topics  .  Alcohol use: No  . Drug use: No    Review of Systems Level 5 caveat:  history/ROS limited by acute/critical illness   constitutional: No fever/chills Eyes: No visual changes. ENT: No sore throat. Cardiovascular: Denies chest pain. Respiratory: Denies shortness of breath. Gastrointestinal: No abdominal pain.  No nausea, no vomiting.  No diarrhea.  No constipation. Genitourinary: Negative for dysuria. Musculoskeletal: Positive for left hip pain. Integumentary: Negative for rash. Neurological: Negative for headaches, focal weakness or numbness.   ____________________________________________   PHYSICAL EXAM:  VITAL SIGNS: ED Triage Vitals  Enc Vitals Group     BP 10/24/20 0453 (!) 148/88     Pulse Rate 10/24/20 0453 (!) 103     Resp 10/24/20 0453 16     Temp 10/24/20 0453 97.8 F (36.6 C)     Temp Source 10/24/20 0453 Oral     SpO2 10/24/20 0453 98 %  Weight 10/24/20 0454 64.7 kg (142 lb 10.2 oz)     Height 10/24/20 0454 1.702 m (5\' 7" )     Head Circumference --      Peak Flow --      Pain Score 10/24/20 0454 5     Pain Loc --      Pain Edu? --      Excl. in [place]? --     Constitutional: Alert and oriented.  Patient is in moderate pain particularly when she is being transferred from stretcher to bed. Eyes: Conjunctivae are normal.  Head: Atraumatic. Nose: No congestion/rhinnorhea. Mouth/Throat: Patient is wearing a mask. Neck: No stridor.  No meningeal signs.   Cardiovascular: Borderline tachycardia, regular rhythm. Good peripheral circulation. Respiratory: Normal respiratory effort.  No retractions. Gastrointestinal: Soft and nontender. No distention.  Musculoskeletal: Tender palpation of the proximal left femur and severe pain with any manipulation of the left lower extremity.  There is currently no shortening or external rotation.  Neurovascularly intact distal to the injury. Neurologic:  Normal speech and language. No gross focal neurologic deficits are  appreciated.  Skin:  Skin is warm, dry and intact.  ____________________________________________   LABS (all labs ordered are listed, but only abnormal results are displayed)  Labs Reviewed  COMPREHENSIVE METABOLIC PANEL - Abnormal; Notable for the following components:      Result Value   Sodium 133 (*)    Chloride 95 (*)    Glucose, Bld 104 (*)    Calcium 8.8 (*)    Albumin 3.0 (*)    ALT 55 (*)    All other components within normal limits  CBC WITH DIFFERENTIAL/PLATELET - Abnormal; Notable for the following components:   WBC 17.6 (*)    Hemoglobin 11.5 (*)    Neutro Abs 14.4 (*)    Abs Immature Granulocytes 1.08 (*)    All other components within normal limits  APTT - Abnormal; Notable for the following components:   aPTT 23 (*)    All other components within normal limits  RESP PANEL BY RT-PCR (FLU A&B, COVID) ARPGX2  PROTIME-INR  TYPE AND SCREEN   ____________________________________________  EKG  ED ECG REPORT I, Hinda Kehr, the attending physician, personally viewed and interpreted this ECG.  Date: 10/24/2020 EKG Time: 4:51 AM Rate: 93 Rhythm: Borderline sinus tachycardia with ventricular bigeminy QRS Axis: normal Intervals: Ventricular bigeminy and some degree of LVH ST/T Wave abnormalities: Non-specific ST segment / T-wave changes, but no clear evidence of acute ischemia. Narrative Interpretation: no definitive evidence of acute ischemia; does not meet STEMI criteria.   ____________________________________________  RADIOLOGY I, Hinda Kehr, personally viewed and evaluated these images (plain radiographs) as part of my medical decision making, as well as reviewing the written report by the radiologist.  ED MD interpretation: Chest x-ray shows some radiation changes in the right hemithorax with no acute abnormality.  X-ray of the left hip shows subcapital left hip fracture with some mild foreshortening and varus angulation.  Official radiology  report(s): DG Chest Port 1 View  Result Date: 10/24/2020 CLINICAL DATA:  Preop left hip pain EXAM: PORTABLE CHEST 1 VIEW COMPARISON:  10/15/2020 FINDINGS: Radiation changes in the right upper lobe with upper hilar retraction. Small right pleural effusion is improved. Left lung is clear.  No pneumothorax. The heart is normal in size. Left chest power port terminates the cavoatrial junction. Midthoracic vertebral augmentation. IMPRESSION: Radiation changes in the right hemithorax. Electronically Signed   By: Julian Hy M.D.   On:  10/24/2020 06:25   DG HIP UNILAT W OR W/O PELVIS 2-3 VIEWS LEFT  Result Date: 10/24/2020 CLINICAL DATA:  Fall, left hip pain EXAM: DG HIP (WITH OR WITHOUT PELVIS) 2-3V LEFT COMPARISON:  None. FINDINGS: Subcapital left hip fracture. Mild foreshortening and varus angulation. Bilateral hip joint spaces are preserved. Visualized bony pelvis appears intact. Degenerative changes of the lower lumbar spine. Bilateral iliac stents. IMPRESSION: Subcapital left hip fracture, as above. Electronically Signed   By: Julian Hy M.D.   On: 10/24/2020 06:26    ____________________________________________   PROCEDURES   Procedure(s) performed (including Critical Care):  Procedures   ____________________________________________   INITIAL IMPRESSION / MDM / [place] / ED COURSE  As part of my medical decision making, I reviewed the following data within the [place] notes reviewed and incorporated, Labs reviewed , EKG interpreted , Old chart reviewed, Radiograph reviewed , Discussed with admitting physician , Discussed with orthopedic surgeon and reviewed Notes from prior ED visits   Differential diagnosis includes, but is not limited to, femur/hip fracture, pelvic fracture, musculoskeletal strain.  The patient is on the cardiac monitor to evaluate for evidence of arrhythmia and/or significant heart rate changes.        Clinical Course as of 10/24/20 0725  Sun Oct 24, 2020  0635 I personally reviewed the patient's imaging and agree with the radiologist's interpretation that there is no evidence of an acute abnormality on chest x-ray, but she has a subcapital left hip fracture with some mild foreshortening and varus angulation.  I called and spoke by phone with Dr. Sharlet Salina with the orthopedic service and he will see the patient after she is admitted to the hospitalist service. [CF]  [id number] Lab work generally reassuring with an essentially normal comprehensive metabolic panel other than some mild hypoalbuminemia and mild hyponatremia.  CBC is notable for a leukocytosis of unclear etiology of 17.6.  Viral respiratory panel is negative including for COVID-19.  Coagulation studies are normal. [CF]  1478 I discussed case by phone with Dr. Francine Graven with the hospitalist service.  She will admit. [CF]    Clinical Course User Index [CF] Hinda Kehr, MD     ____________________________________________  FINAL CLINICAL IMPRESSION(S) / ED DIAGNOSES  Final diagnoses:  Pain  Closed subcapital fracture of left femur, initial encounter [facility])     MEDICATIONS GIVEN DURING THIS VISIT:  Medications  morphine 4 MG/ML injection 4 mg (4 mg Intravenous Given 10/24/20 0501)     ED Discharge Orders    None      *Please note:  Roberta [name] was evaluated in Emergency Department on 10/24/2020 for the symptoms described in the history of present illness. She was evaluated in the context of the global COVID-19 pandemic, which necessitated consideration that the patient might be at risk for infection with the SARS-CoV-2 virus that causes COVID-19. Institutional protocols and algorithms that pertain to the evaluation of patients at risk for COVID-19 are in a state of rapid change based on information released by regulatory bodies including the CDC and federal and state organizations. These policies and algorithms were followed  during the patient's care in the ED.  Some ED evaluations and interventions may be delayed as a result of limited staffing during and after the pandemic.*  Note:  This document was prepared using Dragon voice recognition software and may include unintentional dictation errors.   Hinda Kehr, MD 10/24/20 [phone]

## 2020-10-24 NOTE — ED Notes (Signed)
Orthopedics at bedside

## 2020-10-24 NOTE — H&P (Signed)
History and Physical    Roberta [name] XFG:[id number] DOB: 23-Jul-1945 DOA: 10/24/2020  PCP: Gayland Curry, MD   Patient coming from: Home  I have personally briefly reviewed patient's old medical records in [place]  Chief Complaint: left hip pain,  HPI: Roberta [name] is a 75 y.o. 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   She was discharged from the hospital about a week ago after hospitalization for new onset systolic congestive heart failure. Patient states that she got up to use the bedside commode and her leg gave way and she fell.  She was unable to get up and complains of severe pain in her left hip which she rates 10 x 10 in intensity at its worst.  Pain is worse with any form of movement.  She denies any loss of consciousness after the fall She denied feeling dizzy or lightheaded prior to her fall, denies having any chest pain, no shortness of breath, no headache, no neck pain no nausea, no vomiting, no palpitations, no diaphoresis, no diarrhea, no fever, no chills, no cough, no urinary symptoms, no headache or blurred vision. Labs show sodium 133, potassium 4.1, chloride 95, bicarb 28, glucose 104, BUN 18, creatinine 0.95, calcium 8.8, alkaline phosphatase 79, albumin 3.0, AST 30, ALT 55, total protein 6.5, white count 17.6, hemoglobin 11.5, hematocrit 36.5, MCV 91.5, RDW 15.1, platelet count 356, PT 12.5, INR 0.9 Respiratory viral panel is negative Chest x-ray reviewed by me shows radiation changes in the right hemithorax. Left hip x-ray shows subcapital left hip fracture. Twelve-lead EKG reviewed by me shows sinus tachycardia with LVH and ventricular  bigeminy   ED Course: Patient is a 75 year old Caucasian female who presents to the ER via EMS for evaluation following a fall at home with complaints of left hip pain. X-ray showed a subcapital left hip fracture. Patient will be admitted to the hospital for further evaluation.   Review of Systems: As per HPI otherwise all other systems reviewed and negative.    Past Medical History:  Diagnosis Date  . AAA (abdominal aortic aneurysm) ([place])   . Anemia   . Breast cancer ([place]) 2002   left  . COPD (chronic obstructive pulmonary disease) ([place])   . Dyspnea   . Headache   . High cholesterol   . Personal history of chemotherapy   . Small cell lung cancer ([place])   . Tobacco abuse     Past Surgical History:  Procedure Laterality Date  . ABDOMINAL HYSTERECTOMY    . ARTERY BIOPSY Right 12/05/2017   Procedure: BIOPSY TEMPORAL ARTERY;  Surgeon: Margaretha Sheffield, MD;  Location: [facility] ORS;  Service: ENT;  Laterality: Right;  . ENDOBRONCHIAL ULTRASOUND N/A 02/19/2017   Procedure: ENDOBRONCHIAL ULTRASOUND;  Surgeon: Flora Lipps, MD;  Location: [facility] ORS;  Service: Cardiopulmonary;  Laterality: N/A;  . ENDOVASCULAR REPAIR/STENT GRAFT N/A 12/06/2016   Procedure: Endovascular Repair/Stent Graft;  Surgeon: Katha Cabal, MD;  Location: [facility];  Service: Cardiovascular;  Laterality: N/A;  . IR FLUORO GUIDE PORT INSERTION RIGHT  03/02/2017  . KYPHOPLASTY N/A 06/11/2018   Procedure: KYPHOPLASTY L4 BIOPSY WITH RFA;  Surgeon: Hessie Knows, MD;  Location: [facility] ORS;  Service: Orthopedics;  Laterality: N/A;  .  KYPHOPLASTY N/A 12/16/2019   Procedure: T6 KYPHOPLASTY;  Surgeon: Hessie Knows, MD;  Location: [facility] ORS;  Service: Orthopedics;  Laterality: N/A;  . MASTECTOMY Left 2002   with sentinel node      reports that she has been smoking cigarettes. She has a 75.00 pack-year smoking history. She has never used smokeless tobacco. She reports that she does not drink alcohol and does not use  drugs.  No Known Allergies  Family History  Problem Relation Age of Onset  . CAD Mother   . Colon cancer Brother   . Breast cancer Neg Hx       Prior to Admission medications   Medication Sig Start Date End Date Taking? Authorizing Provider  acetaminophen (TYLENOL) 325 MG tablet Take 650 mg by mouth every 6 (six) hours as needed for mild pain.     [provider]  albuterol (PROVENTIL HFA;VENTOLIN HFA) 108 (90 Base) MCG/ACT inhaler Inhale 2 puffs into the lungs every 6 (six) hours as needed for wheezing or shortness of breath. 03/23/16   Frederich Cha, MD  aspirin EC 81 MG EC tablet Take 1 tablet (81 mg total) by mouth daily. Swallow whole. 09/09/20   Bonnielee Haff, MD  atorvastatin (LIPITOR) 10 MG tablet Take 10 mg by mouth at bedtime.  08/11/16   [provider]  dapagliflozin propanediol (FARXIGA) 10 MG TABS tablet Take 1 tablet (10 mg total) by mouth daily. 10/17/20 11/16/20  Sidney Ace, MD  fluticasone-salmeterol (ADVAIR HFA) 809-98 MCG/ACT inhaler Inhale 2 puffs into the lungs 2 (two) times daily.    [provider]  furosemide (LASIX) 20 MG tablet Take 1 tablet (20 mg total) by mouth every Monday, Wednesday, and Friday. 10/18/20   Sreenath, Sudheer B, MD  ipratropium (ATROVENT) 0.02 % nebulizer solution Take by nebulization. 10/07/20   [provider]  metoprolol succinate (TOPROL-XL) 25 MG 24 hr tablet Take 1 tablet (25 mg total) by mouth daily. 10/18/20 11/17/20  Sidney Ace, MD  nicotine (NICODERM CQ - DOSED IN MG/24 HOURS) 14 mg/24hr patch Place 1 patch (14 mg total) onto the skin daily. 10/18/20   Sidney Ace, MD  omeprazole (PRILOSEC) 20 MG capsule Take 1 capsule (20 mg total) by mouth daily. 09/27/20   Jacquelin Hawking, NP  vitamin B-12 (CYANOCOBALAMIN) 1000 MCG tablet Take 1 tablet (1,000 mcg total) by mouth daily. 09/09/20   Bonnielee Haff, MD    Physical Exam: Vitals:   10/24/20 0730 10/24/20 0745 10/24/20 0800  10/24/20 0815  BP:      Pulse:      Resp: 18 (!) 24 (!) 23 (!) 24  Temp:      TempSrc:      SpO2:      Weight:      Height:         Vitals:   10/24/20 0730 10/24/20 0745 10/24/20 0800 10/24/20 0815  BP:      Pulse:      Resp: 18 (!) 24 (!) 23 (!) 24  Temp:      TempSrc:      SpO2:      Weight:      Height:          Constitutional: Alert and oriented x 3 .  Appears to be in painful distress.  Thin and frail n HEENT:      Head: Normocephalic and atraumatic.         Eyes: PERLA, EOMI, Conjunctivae are normal. Sclera is non-icteric.  Mouth/Throat: Mucous membranes are moist.       Neck: Supple with no signs of meningismus. Cardiovascular:  Tachycardia. No murmurs, gallops, or rubs. 2+ symmetrical distal pulses are present . No JVD. No LE edema Respiratory: Respiratory effort normal .Lungs sounds clear bilaterally. No wheezes, crackles, or rhonchi.  Gastrointestinal: Soft, non tender, and non distended with positive bowel sounds.  Genitourinary: No CVA tenderness. Musculoskeletal:  Decreased range of motion left hip. No cyanosis, or erythema of extremities. Neurologic:  Face is symmetric. Moving all extremities. No gross focal neurologic deficits  Skin: Skin is warm, dry.  No rash or ulcers Psychiatric: Mood and affect are normal   Labs on Admission: I have personally reviewed following labs and imaging studies  CBC: Recent Labs  Lab 10/24/20 0459  WBC 17.6*  NEUTROABS 14.4*  HGB 11.5*  HCT 36.5  MCV 91.5  PLT 884   Basic Metabolic Panel: Recent Labs  Lab 10/24/20 0459  NA 133*  K 4.1  CL 95*  CO2 28  GLUCOSE 104*  BUN 18  CREATININE 0.95  CALCIUM 8.8*   GFR: Estimated Creatinine Clearance: 50.5 mL/min (by C-G formula based on SCr of 0.95 mg/dL). Liver Function Tests: Recent Labs  Lab 10/24/20 0459  AST 30  ALT 55*  ALKPHOS 79  BILITOT 0.7  PROT 6.5  ALBUMIN 3.0*   No results for input(s): LIPASE, AMYLASE in the last 168 hours. No  results for input(s): AMMONIA in the last 168 hours. Coagulation Profile: Recent Labs  Lab 10/24/20 0459  INR 0.9   Cardiac Enzymes: No results for input(s): CKTOTAL, CKMB, CKMBINDEX, TROPONINI in the last 168 hours. BNP (last 3 results) No results for input(s): PROBNP in the last 8760 hours. HbA1C: No results for input(s): HGBA1C in the last 72 hours. CBG: No results for input(s): GLUCAP in the last 168 hours. Lipid Profile: No results for input(s): CHOL, HDL, LDLCALC, TRIG, CHOLHDL, LDLDIRECT in the last 72 hours. Thyroid Function Tests: No results for input(s): TSH, T4TOTAL, FREET4, T3FREE, THYROIDAB in the last 72 hours. Anemia Panel: No results for input(s): VITAMINB12, FOLATE, FERRITIN, TIBC, IRON, RETICCTPCT in the last 72 hours. Urine analysis:    Component Value Date/Time   COLORURINE STRAW (A) 09/06/2020 1954   APPEARANCEUR CLEAR (A) 09/06/2020 1954   LABSPEC 1.011 09/06/2020 1954   PHURINE 7.0 09/06/2020 1954   GLUCOSEU NEGATIVE 09/06/2020 1954   HGBUR SMALL (A) 09/06/2020 1954   BILIRUBINUR NEGATIVE 09/06/2020 [place] NEGATIVE 09/06/2020 1954   PROTEINUR NEGATIVE 09/06/2020 1954   NITRITE NEGATIVE 09/06/2020 1954   LEUKOCYTESUR NEGATIVE 09/06/2020 1954    Radiological Exams on Admission: DG Chest Port 1 View  Result Date: 10/24/2020 CLINICAL DATA:  Preop left hip pain EXAM: PORTABLE CHEST 1 VIEW COMPARISON:  10/15/2020 FINDINGS: Radiation changes in the right upper lobe with upper hilar retraction. Small right pleural effusion is improved. Left lung is clear.  No pneumothorax. The heart is normal in size. Left chest power port terminates the cavoatrial junction. Midthoracic vertebral augmentation. IMPRESSION: Radiation changes in the right hemithorax. Electronically Signed   By: Julian Hy M.D.   On: 10/24/2020 06:25   DG HIP UNILAT W OR W/O PELVIS 2-3 VIEWS LEFT  Result Date: 10/24/2020 CLINICAL DATA:  Fall, left hip pain EXAM: DG HIP (WITH OR  WITHOUT PELVIS) 2-3V LEFT COMPARISON:  None. FINDINGS: Subcapital left hip fracture. Mild foreshortening and varus angulation. Bilateral hip joint spaces are preserved. Visualized bony pelvis appears intact. Degenerative changes  of the lower lumbar spine. Bilateral iliac stents. IMPRESSION: Subcapital left hip fracture, as above. Electronically Signed   By: Julian Hy M.D.   On: 10/24/2020 06:26     Assessment/Plan Principal Problem:   Closed left subtrochanteric femur fracture (HCC) Active Problems:   COPD (chronic obstructive pulmonary disease) (HCC)   PAD (peripheral artery disease) (H[facility])   GERD (gastroesophageal reflux disease)   Tobacco abuse   Chronic systolic CHF (congestive heart failure) ([place])      Subcapital left hip fracture Following mechanical fall Place patient on fall precautions Immobilize left lower extremity Pain control Muscle relaxants Consult orthopedic surgery We will request cardiology consult for preoperative evaluation for cardiomyopathy with an LVEF of 25 to 30%     Chronic systolic heart failure Not acutely exacerbated Patient has an LVEF of 25 to 30% Continue metoprolol and furosemide Continue Farxiga Patient not on any ACE inhibitor or ARB due to recent renal failure.   COPD Not acutely exacerbated Continue as needed bronchodilator therapy as well as inhaled steroids    Nicotine dependence Smoking cessation was discussed with patient in detail Continue nicotine transdermal patch 14 mg daily       History of small cell lung cancer in the right upper lobe She is status post surgery chemotherapy and XRT.  Currently not on chemotherapy.     GERD Continue Protonix   DVT prophylaxis: SCD Code Status: DO NOT RESUSCITATE Family Communication: Greater than 50% of time was spent discussing patient's condition and plan of care with her at the bedside.  CODE STATUS was discussed that she is a DNR. Disposition Plan: Back to  previous home environment Consults called: Orthopedic surgery Status: At the time of admission, it appears that the appropriate admission status for this patient is inpatient. This is judged to be reasonable and necessary in order to provide the required intensity of service to ensure the patient's safety given the presenting symptoms, physical exam findings and initial radiographic and laboratory data in the context of their comorbid conditions. Patient requires inpatient status due to high intensity of service, high risk for further deterioration and high frequency of surveillance required.    Bessy Reaney MD [facility]     10/24/2020, 9:00 AM

## 2020-10-24 NOTE — ED Notes (Signed)
Patient sleeping

## 2020-10-24 NOTE — ED Notes (Signed)
Pt given glycerin swabs

## 2020-10-24 NOTE — ED Notes (Signed)
Pt requesting new nicotine patch- asked pharmacy tech to do med rec so pharmacy can verify medications

## 2020-10-24 NOTE — ED Notes (Signed)
Message sent to pharmacy to verify pt medications

## 2020-10-24 NOTE — ED Triage Notes (Signed)
Pt brought in to ER by [facility] with c/o fall. She was using her bedside commode and tripped and fell. She is complaining of pain in her left hip. No shortening or rotation seen. It does hurt when she moves it. MD in room to evaluate pt.

## 2020-10-24 NOTE — ED Notes (Signed)
Called pharmacy to verify pt medications

## 2020-10-24 NOTE — Anesthesia Preprocedure Evaluation (Addendum)
Anesthesia Evaluation  Patient identified by MRN, date of birth, ID band Patient awake    Reviewed: Allergy & Precautions, NPO status , Patient's Chart, lab work & pertinent test results  History of Anesthesia Complications Negative for: history of anesthetic complications  Airway Mallampati: II       Dental  (+) Poor Dentition, Missing, Chipped   Pulmonary neg sleep apnea, COPD,  COPD inhaler, Current Smoker and Patient abstained from smoking.,           Cardiovascular (-) hypertension+CHF (LVEF 25%)  (-) Past MI (-) dysrhythmias (-) Valvular Problems/Murmurs     Neuro/Psych neg Seizures    GI/Hepatic Neg liver ROS, GERD  Medicated and Controlled,  Endo/Other  neg diabetes  Renal/GU negative Renal ROS     Musculoskeletal   Abdominal   Peds  Hematology  (+) anemia ,   Anesthesia Other Findings   Reproductive/Obstetrics                            Anesthesia Physical Anesthesia Plan  ASA: III  Anesthesia Plan: General   Post-op Pain Management:    Induction:   PONV Risk Score and Plan: 2 and Ondansetron and Dexamethasone  Airway Management Planned: Oral ETT  Additional Equipment:   Intra-op Plan:   Post-operative Plan:   Informed Consent: I have reviewed the patients History and Physical, chart, labs and discussed the procedure including the risks, benefits and alternatives for the proposed anesthesia with the patient or authorized representative who has indicated his/her understanding and acceptance.       Plan Discussed with:   Anesthesia Plan Comments:         Anesthesia Quick Evaluation

## 2020-10-25 ENCOUNTER — Encounter: Payer: Self-pay | Admitting: Internal Medicine

## 2020-10-25 DIAGNOSIS — S7225XD Nondisplaced subtrochanteric fracture of left femur, subsequent encounter for closed fracture with routine healing: Secondary | ICD-10-CM

## 2020-10-25 DIAGNOSIS — I5022 Chronic systolic (congestive) heart failure: Secondary | ICD-10-CM | POA: Diagnosis not present

## 2020-10-25 DIAGNOSIS — E440 Moderate protein-calorie malnutrition: Secondary | ICD-10-CM | POA: Insufficient documentation

## 2020-10-25 LAB — BASIC METABOLIC PANEL
Anion gap: 11 (ref 5–15)
BUN: 21 mg/dL (ref 8–23)
CO2: 24 mmol/L (ref 22–32)
Calcium: 8.4 mg/dL — ABNORMAL LOW (ref 8.9–10.3)
Chloride: 96 mmol/L — ABNORMAL LOW (ref 98–111)
Creatinine, Ser: 0.78 mg/dL (ref 0.44–1.00)
GFR, Estimated: >60 mL/min (ref >60)
Glucose, Bld: 81 mg/dL (ref 70–99)
Potassium: 4 mmol/L (ref 3.5–5.1)
Sodium: 131 mmol/L — ABNORMAL LOW (ref 135–145)

## 2020-10-25 LAB — CBC
HCT: 32.7 % — ABNORMAL LOW (ref 36.0–46.0)
Hemoglobin: 10.6 g/dL — ABNORMAL LOW (ref 12.0–15.0)
MCH: 29.1 pg (ref 26.0–34.0)
MCHC: 32.4 g/dL (ref 30.0–36.0)
MCV: 89.8 fL (ref 80.0–100.0)
Platelets: 306 10*3/uL (ref 150–400)
RBC: 3.64 MIL/uL — ABNORMAL LOW (ref 3.87–5.11)
RDW: 15.3 % (ref 11.5–15.5)
WBC: 17.7 10*3/uL — ABNORMAL HIGH (ref 4.0–10.5)
nRBC: 0 % (ref 0.0–0.2)

## 2020-10-25 MED ORDER — ZOLPIDEM TARTRATE 5 MG PO TABS
5.0000 mg | ORAL_TABLET | Freq: Every evening | ORAL | Status: DC | PRN
  Administered 2020-10-25 – 2020-10-28 (×4): 5 mg via ORAL
  Filled 2020-10-25 (×4): qty 1

## 2020-10-25 MED ORDER — DIPHENHYDRAMINE HCL 25 MG PO CAPS
25.0000 mg | ORAL_CAPSULE | Freq: Once | ORAL | Status: AC
  Administered 2020-10-25: 25 mg via ORAL
  Filled 2020-10-25: qty 1

## 2020-10-25 MED ORDER — CHLORHEXIDINE GLUCONATE CLOTH 2 % EX PADS
6.0000 | MEDICATED_PAD | Freq: Every day | CUTANEOUS | Status: DC
  Administered 2020-10-25 – 2020-10-30 (×4): 6 via TOPICAL

## 2020-10-25 MED ORDER — ADULT MULTIVITAMIN W/MINERALS CH
1.0000 | ORAL_TABLET | Freq: Every day | ORAL | Status: DC
  Administered 2020-10-25 – 2020-10-30 (×5): 1 via ORAL
  Filled 2020-10-25 (×5): qty 1

## 2020-10-25 MED ORDER — ENSURE ENLIVE PO LIQD
237.0000 mL | Freq: Two times a day (BID) | ORAL | Status: DC
  Administered 2020-10-25 – 2020-10-30 (×8): 237 mL via ORAL

## 2020-10-25 MED ORDER — CEFAZOLIN (ANCEF) 1 G IV SOLR
1.0000 g | INTRAVENOUS | Status: AC
  Administered 2020-10-26: 1 g
  Filled 2020-10-25: qty 1

## 2020-10-25 NOTE — Progress Notes (Addendum)
Progress Note    Roberta [name]  WUG:[id number] DOB: 1945-08-02  DOA: 10/24/2020 PCP: Gayland Curry, MD      Brief Narrative:    Medical records reviewed and are as summarized below:  Roberta [name] is a 75 y.o. 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 She presented to the hospital because of severe pain in the left hip after a fall at home..    Assessment/Plan:   Principal Problem:   Closed left subtrochanteric femur fracture (HCC) Active Problems:   COPD (chronic obstructive pulmonary disease) (HCC)   PAD (peripheral artery disease) (HCC)   GERD (gastroesophageal reflux disease)   Tobacco abuse   Chronic systolic CHF (congestive heart failure) (HCC)   Body mass index is 22.34 kg/m.   Left subcapital displaced femoral neck fracture: Analgesics as needed for pain.  Plan for left hip surgery tomorrow.  Follow-up with orthopedic surgeon.  Cardiomyopathy/chronic systolic and diastolic CHF/SVT: Patient had short run of SVT on telemetry this morning around 8:41 AM.  Continue metoprolol.  Hold Lasix and dapagliflozin.  2D echo on 10/13/2020 showed EF estimated at 25 to 30%.  COPD: Compensated.  Continue bronchodilators as needed  Other comorbidities include AAA s/p endovascular repair/stent graft in 2018, PVD, vitamin B12 deficiency, breast cancer status post mastectomy in 2002, history of small cell lung cancer s/p lobectomy about 3 years ago, tobacco use disorder.     Diet Order            Diet NPO time specified Except for: Sips with Meds  Diet effective midnight           Diet Heart Room service appropriate? Yes; Fluid consistency: Thin  Diet  effective now                    Consultants:  Cardiologist  Orthopedic surgeon  Procedures:  None    Medications:   . atorvastatin  10 mg Oral QHS  . [START ON 10/26/2020] ceFAZolin  1 g Other To OR  . Chlorhexidine Gluconate Cloth  6 each Topical Daily  . dapagliflozin propanediol  10 mg Oral Daily  . feeding supplement  237 mL Oral BID BM  . furosemide  20 mg Oral Q M,W,F  . metoprolol tartrate  25 mg Oral Q6H  . mometasone-formoterol  2 puff Inhalation BID  . multivitamin with minerals  1 tablet Oral Daily  . nicotine  14 mg Transdermal Daily  . pantoprazole  40 mg Oral Daily  . senna  1 tablet Oral BID  . vitamin B-12  1,000 mcg Oral Daily   Continuous Infusions: . methocarbamol (ROBAXIN) IV       Anti-infectives (From admission, onward)   Start     Dose/Rate Route Frequency Ordered Stop   10/26/20 1200  ceFAZolin (ANCEF) powder 1 g        1 g Other To Surgery 10/25/20 0933 10/27/20 1200             Family Communication/Anticipated D/C date and plan/Code Status   DVT prophylaxis: SCDs Start: 10/24/20 8882     Code Status: DNR  Family Communication: Discussed with her daughter and granddaughter at the bedside Disposition Plan:    Status  is: Inpatient  Remains inpatient appropriate because:Inpatient level of care appropriate due to severity of illness   Dispo: The patient is from: Home              Anticipated d/c is to: SNF              Patient currently is not medically stable to d/c.   Difficult to place patient No           Subjective:   C/o pain in the left hip.  No dizziness, palpitations, shortness of breath or chest pain.  Her daughter and granddaughter were at the bedside.  Her nurse was also at the bedside.  Objective:    Vitals:   10/25/20 0137 10/25/20 0542 10/25/20 0746 10/25/20 1137  BP: 124/72 118/69 117/76 120/67  Pulse: 91 60 88 80  Resp: 18 16 16 17   Temp: 97.7 F (36.5 C) 98.5 F (36.9 C) 97.6 F  (36.4 C) (!) 97.5 F (36.4 C)  TempSrc: Oral  Oral Oral  SpO2: 94% 95% 94% 95%  Weight:      Height:       No data found.   Intake/Output Summary (Last 24 hours) at 10/25/2020 1204 Last data filed at 10/25/2020 0415 Gross per 24 hour  Intake --  Output 250 ml  Net -250 ml   Filed Weights   10/24/20 0454  Weight: 64.7 kg    Exam:   GEN: NAD SKIN: Bruise on right hip. EYES: No pallor or icterus ENT: MMM CV: RRR PULM: CTA B ABD: soft, ND, NT, +BS CNS: AAO x 3, non focal EXT: Right and left hip tenderness         Data Reviewed:   I have personally reviewed following labs and imaging studies:  Labs: Labs show the following:   Basic Metabolic Panel: Recent Labs  Lab 10/24/20 0459 10/25/20 0505  NA 133* 131*  K 4.1 4.0  CL 95* 96*  CO2 28 24  GLUCOSE 104* 81  BUN 18 21  CREATININE 0.95 0.78  CALCIUM 8.8* 8.4*   GFR Estimated Creatinine Clearance: 60 mL/min (by C-G formula based on SCr of 0.78 mg/dL). Liver Function Tests: Recent Labs  Lab 10/24/20 0459  AST 30  ALT 55*  ALKPHOS 79  BILITOT 0.7  PROT 6.5  ALBUMIN 3.0*   No results for input(s): LIPASE, AMYLASE in the last 168 hours. No results for input(s): AMMONIA in the last 168 hours. Coagulation profile Recent Labs  Lab 10/24/20 0459  INR 0.9    CBC: Recent Labs  Lab 10/24/20 0459 10/25/20 0505  WBC 17.6* 17.7*  NEUTROABS 14.4*  --   HGB 11.5* 10.6*  HCT 36.5 32.7*  MCV 91.5 89.8  PLT 356 306   Cardiac Enzymes: No results for input(s): CKTOTAL, CKMB, CKMBINDEX, TROPONINI in the last 168 hours. BNP (last 3 results) No results for input(s): PROBNP in the last 8760 hours. CBG: No results for input(s): GLUCAP in the last 168 hours. D-Dimer: No results for input(s): DDIMER in the last 72 hours. Hgb A1c: No results for input(s): HGBA1C in the last 72 hours. Lipid Profile: No results for input(s): CHOL, HDL, LDLCALC, TRIG, CHOLHDL, LDLDIRECT in the last 72 hours. Thyroid  function studies: No results for input(s): TSH, T4TOTAL, T3FREE, THYROIDAB in the last 72 hours.  Invalid input(s): FREET3 Anemia work up: No results for input(s): VITAMINB12, FOLATE, FERRITIN, TIBC, IRON, RETICCTPCT in the last 72 hours. Sepsis Labs: Recent Labs  Lab 10/24/20  0459 10/25/20 0505  WBC 17.6* 17.7*    Microbiology Recent Results (from the past 240 hour(s))  Resp Panel by RT-PCR (Flu A&B, Covid) Nasopharyngeal Swab     Status: None   Collection Time: 10/24/20  4:59 AM   Specimen: Nasopharyngeal Swab; Nasopharyngeal(NP) swabs in vial transport medium  Result Value Ref Range Status   SARS Coronavirus 2 by RT PCR NEGATIVE NEGATIVE Final    Comment: (NOTE) SARS-CoV-2 target nucleic acids are NOT DETECTED.  The SARS-CoV-2 RNA is generally detectable in upper respiratory specimens during the acute phase of infection. The lowest concentration of SARS-CoV-2 viral copies this assay can detect is 138 copies/mL. A negative result does not preclude SARS-Cov-2 infection and should not be used as the sole basis for treatment or other patient management decisions. A negative result may occur with  improper specimen collection/handling, submission of specimen other than nasopharyngeal swab, presence of viral mutation(s) within the areas targeted by this assay, and inadequate number of viral copies(<138 copies/mL). A negative result must be combined with clinical observations, patient history, and epidemiological information. The expected result is Negative.  Fact Sheet for Patients:  EntrepreneurPulse.com.au  Fact Sheet for Healthcare Providers:  IncredibleEmployment.be  This test is no t yet approved or cleared by the [place] FDA and  has been authorized for detection and/or diagnosis of SARS-CoV-2 by FDA under an Emergency Use Authorization (EUA). This EUA will remain  in effect (meaning this test can be used) for the duration of  the COVID-19 declaration under Section 564(b)(1) of the Act, 21 U.S.C.section 360bbb-3(b)(1), unless the authorization is terminated  or revoked sooner.       Influenza A by PCR NEGATIVE NEGATIVE Final   Influenza B by PCR NEGATIVE NEGATIVE Final    Comment: (NOTE) The Xpert Xpress SARS-CoV-2/FLU/RSV plus assay is intended as an aid in the diagnosis of influenza from Nasopharyngeal swab specimens and should not be used as a sole basis for treatment. Nasal washings and aspirates are unacceptable for Xpert Xpress SARS-CoV-2/FLU/RSV testing.  Fact Sheet for Patients: EntrepreneurPulse.com.au  Fact Sheet for Healthcare Providers: IncredibleEmployment.be  This test is not yet approved or cleared by the [place] FDA and has been authorized for detection and/or diagnosis of SARS-CoV-2 by FDA under an Emergency Use Authorization (EUA). This EUA will remain in effect (meaning this test can be used) for the duration of the COVID-19 declaration under Section 564(b)(1) of the Act, 21 U.S.C. section 360bbb-3(b)(1), unless the authorization is terminated or revoked.  Performed at [facility], [address] [place], [place] [address]     Procedures and diagnostic studies:  DG Chest [facility] 1 View  Result Date: 10/24/2020 CLINICAL DATA:  Preop left hip pain EXAM: PORTABLE CHEST 1 VIEW COMPARISON:  10/15/2020 FINDINGS: Radiation changes in the right upper lobe with upper hilar retraction. Small right pleural effusion is improved. Left lung is clear.  No pneumothorax. The heart is normal in size. Left chest power port terminates the cavoatrial junction. Midthoracic vertebral augmentation. IMPRESSION: Radiation changes in the right hemithorax. Electronically Signed   By: Julian Hy M.D.   On: 10/24/2020 06:25   DG HIP UNILAT W OR W/O PELVIS 2-3 VIEWS LEFT  Result Date: 10/24/2020 CLINICAL DATA:  Fall, left hip pain EXAM: DG HIP (WITH OR  WITHOUT PELVIS) 2-3V LEFT COMPARISON:  None. FINDINGS: Subcapital left hip fracture. Mild foreshortening and varus angulation. Bilateral hip joint spaces are preserved. Visualized bony pelvis appears intact. Degenerative changes of the lower  lumbar spine. Bilateral iliac stents. IMPRESSION: Subcapital left hip fracture, as above. Electronically Signed   By: Julian Hy M.D.   On: 10/24/2020 06:26               LOS: 1 day   Orlinda Slomski  [facility]   Pager on [email or web address]. If 7PM-7AM, please contact night-coverage at [email or web address]     10/25/2020, 12:04 PM

## 2020-10-25 NOTE — Progress Notes (Signed)
Per telemetry- pt had 18 beats of SVT with HR in the 150's. At this time HR is stable in the 90's, MD notified and will CTM.

## 2020-10-25 NOTE — Progress Notes (Signed)
Initial Nutrition Assessment  DOCUMENTATION CODES:  Non-severe (moderate) malnutrition in context of chronic illness  INTERVENTION:   Recommend liberalizing diet to regular, encourage PO intake  Ensure Enlive po BID, each supplement provides 350 kcal and 20 grams of protein  MVI with minerals daily  Request new measured weight  NUTRITION DIAGNOSIS:  Moderate Malnutrition related to chronic illness (cancer, COPD) as evidenced by mild fat depletion,mild muscle depletion.  GOAL:  Patient will meet greater than or equal to 90% of their needs  MONITOR:  PO intake,Supplement acceptance,Labs,I & O's  REASON FOR ASSESSMENT:  Consult Hip fracture protocol  ASSESSMENT:  Pt presented to ED for evaluation of pain in her left hip after a fall at home. PMH relevant for AAA, anemia, breast cancer with hx of bilateral mastectomy, COPD, small cell lung cancer status post lobectomy, chemo/XRT, GERD, PAD, and HLD. Recently admitted 5/17-5/22 and followed by RD.   In ED, pt was found to have left hip fracture. Surgery currently planned for 5/31. At baseline, pt lives at home with her daughter.  Pt resting in bed at the time of visit. Reports that she ate pretty well at breakfast this morning and feels that appetite is at baseline. Denies GI distress at this time. Pt endorses drinking ensure at home, agreeable to receiving during admission.    Nutritionally Relevant Medications Scheduled Meds: . atorvastatin  10 mg Oral QHS  . dapagliflozin propanediol  10 mg Oral Daily  . furosemide  20 mg Oral Q M,W,F  . pantoprazole  40 mg Oral Daily  . senna  1 tablet Oral BID  . vitamin B-12  1,000 mcg Oral Daily   Labs reviewed:  Na 131, chloride 96  NUTRITION - FOCUSED PHYSICAL EXAM: Flowsheet Row Most Recent Value  Orbital Region Severe depletion  Upper Arm Region Mild depletion  Thoracic and Lumbar Region Mild depletion  Buccal Region Severe depletion  Temple Region Severe depletion   Clavicle Bone Region Mild depletion  Clavicle and Acromion Bone Region Mild depletion  Scapular Bone Region No depletion  Dorsal Hand Moderate depletion  Patellar Region No depletion  Anterior Thigh Region No depletion  Posterior Calf Region Mild depletion  Edema (RD Assessment) None  Hair Reviewed  Eyes Reviewed  Mouth Reviewed  Skin Reviewed  Nails Reviewed     Diet Order:   Diet Order            Diet regular Room service appropriate? Yes; Fluid consistency: Thin  Diet effective now                EDUCATION NEEDS:  No education needs have been identified at this time  Skin:  Skin Assessment: Reviewed RN Assessment  Last BM:  5/27 per RN documentation  Height:  Ht Readings from Last 1 Encounters:  10/24/20 5\' 7"  (1.702 m)    Weight:  Wt Readings from Last 1 Encounters:  10/24/20 64.7 kg    Ideal Body Weight:  61.4 kg  BMI:  Body mass index is 22.34 kg/m.  Estimated Nutritional Needs:   Kcal:  1800-2000 kcal/d  Protein:  90-100 g/d  Fluid:  1.8-2 L/d   Ranell Patrick, RD, LDN Clinical Dietitian Pager on [place]

## 2020-10-25 NOTE — TOC Transition Note (Signed)
Transition of Care [facility]) - CM/SW Discharge Note   Patient Details  Name: Roberta [name] MRN: [id number] Date of Birth: 07-25-1945  [facility]) CM/SW Contact:  Pete Pelt, RN Phone Number: 10/25/2020, 9:54 AM   Clinical Narrative:   TOC in to see patient and daughter at bedside.  Patient currently lives at home with her daughter.  Per daughter, no concerns about getting to appointments or getting/taking medications.    Daughter would like to apply for Medicaid, referral made to [facility].  Discussed plans for surgery tomorrow, daughter amenable to SNF if recommended.  TOC explained that PT/OT will evaluate after surgery and TOC will communicate recommendation to patient and family.  No further questions or concerns at this time, TOC contact information given, TOC to follow through discharge.    Final next level of care:  (TBD) Barriers to Discharge: Continued Medical Work up   Patient Goals and CMS Choice Patient states their goals for this hospitalization and ongoing recovery are:: To get stronger and be able to walk   Choice offered to / list presented to : NA  Discharge Placement                       Discharge Plan and Services   Discharge Planning Services: CM Consult            DME Arranged: N/A                    Social Determinants of Health (SDOH) Interventions     Readmission Risk Interventions Readmission Risk Prevention Plan 10/25/2020  Transportation Screening Complete  PCP or Specialist Appt within 5-7 Days Complete  Home Care Screening Complete  Medication Review (RN CM) Complete  Some recent data might be hidden

## 2020-10-25 NOTE — Plan of Care (Signed)

## 2020-10-25 NOTE — Plan of Care (Signed)

## 2020-10-25 NOTE — Progress Notes (Signed)
  Subjective:  Patient reports pain as well controlled.  No complaints this morning  Objective:   VITALS:   Vitals:   10/25/20 0128 10/25/20 0137 10/25/20 0542 10/25/20 0746  BP: (!) 111/59 124/72 118/69 117/76  Pulse: 86 91 60 88  Resp: 18 18 16 16   Temp: 97.6 F (36.4 C) 97.7 F (36.5 C) 98.5 F (36.9 C) 97.6 F (36.4 C)  TempSrc: Oral Oral  Oral  SpO2: 94% 94% 95% 94%  Weight:      Height:        PHYSICAL EXAM:  General: alert, comfortable Left lower extremity: Neurovascular intact  LABS  Results for orders placed or performed during the hospital encounter of 10/24/20 (from the past 24 hour(s))  CBC     Status: Abnormal   Collection Time: 10/25/20  5:05 AM  Result Value Ref Range   WBC 17.7 (H) 4.0 - 10.5 K/uL   RBC 3.64 (L) 3.87 - 5.11 MIL/uL   Hemoglobin 10.6 (L) 12.0 - 15.0 g/dL   HCT 32.7 (L) 36.0 - 46.0 %   MCV 89.8 80.0 - 100.0 fL   MCH 29.1 26.0 - 34.0 pg   MCHC 32.4 30.0 - 36.0 g/dL   RDW 15.3 11.5 - 15.5 %   Platelets 306 150 - 400 K/uL   nRBC 0.0 0.0 - 0.2 %  Basic metabolic panel     Status: Abnormal   Collection Time: 10/25/20  5:05 AM  Result Value Ref Range   Sodium 131 (L) 135 - 145 mmol/L   Potassium 4.0 3.5 - 5.1 mmol/L   Chloride 96 (L) 98 - 111 mmol/L   CO2 24 22 - 32 mmol/L   Glucose, Bld 81 70 - 99 mg/dL   BUN 21 8 - 23 mg/dL   Creatinine, Ser 0.78 0.44 - 1.00 mg/dL   Calcium 8.4 (L) 8.9 - 10.3 mg/dL   GFR, Estimated >60 >60 mL/min   Anion gap 11 5 - 15    DG Chest Port 1 View  Result Date: 10/24/2020 CLINICAL DATA:  Preop left hip pain EXAM: PORTABLE CHEST 1 VIEW COMPARISON:  10/15/2020 FINDINGS: Radiation changes in the right upper lobe with upper hilar retraction. Small right pleural effusion is improved. Left lung is clear.  No pneumothorax. The heart is normal in size. Left chest power port terminates the cavoatrial junction. Midthoracic vertebral augmentation. IMPRESSION: Radiation changes in the right hemithorax.  Electronically Signed   By: Julian Hy M.D.   On: 10/24/2020 06:25   DG HIP UNILAT W OR W/O PELVIS 2-3 VIEWS LEFT  Result Date: 10/24/2020 CLINICAL DATA:  Fall, left hip pain EXAM: DG HIP (WITH OR WITHOUT PELVIS) 2-3V LEFT COMPARISON:  None. FINDINGS: Subcapital left hip fracture. Mild foreshortening and varus angulation. Bilateral hip joint spaces are preserved. Visualized bony pelvis appears intact. Degenerative changes of the lower lumbar spine. Bilateral iliac stents. IMPRESSION: Subcapital left hip fracture, as above. Electronically Signed   By: Julian Hy M.D.   On: 10/24/2020 06:26    Assessment: 75 year old female with multiple medical comorbidities including systolic CHF admitted with a displaced left femoral neck fracture.  Plan: -Appreciate hospitalist and multidisciplinary team support -Recommendation was made for left hip hemiarthroplasty.  Risks and benefits discussed with patient and her daughter.  Consent was provided.  We will plan for surgery tomorrow, 5/31.  Please make n.p.o. at midnight.  Renee Harder , MD 10/25/2020, 9:30 AM

## 2020-10-26 ENCOUNTER — Encounter
Admission: EM | Disposition: A | Discharge status: Skilled Nursing Facility | Payer: Self-pay | Source: Home / Self Care | Attending: Internal Medicine

## 2020-10-26 ENCOUNTER — Inpatient Hospital Stay: Payer: Medicare HMO

## 2020-10-26 ENCOUNTER — Inpatient Hospital Stay: Payer: Medicare HMO | Admitting: Anesthesiology

## 2020-10-26 ENCOUNTER — Encounter: Payer: Self-pay | Admitting: Internal Medicine

## 2020-10-26 DIAGNOSIS — S7225XD Nondisplaced subtrochanteric fracture of left femur, subsequent encounter for closed fracture with routine healing: Secondary | ICD-10-CM | POA: Diagnosis not present

## 2020-10-26 HISTORY — PX: HIP ARTHROPLASTY: SHX981

## 2020-10-26 SURGERY — HEMIARTHROPLASTY, HIP, DIRECT ANTERIOR APPROACH, FOR FRACTURE
Anesthesia: General | Site: Hip | Laterality: Left

## 2020-10-26 MED ORDER — PHENYLEPHRINE HCL (PRESSORS) 10 MG/ML IV SOLN
INTRAVENOUS | Status: AC
  Filled 2020-10-26: qty 1

## 2020-10-26 MED ORDER — FENTANYL CITRATE (PF) 100 MCG/2ML IJ SOLN
INTRAMUSCULAR | Status: AC
  Filled 2020-10-26: qty 2

## 2020-10-26 MED ORDER — VASOPRESSIN 20 UNIT/ML IV SOLN
INTRAVENOUS | Status: DC | PRN
  Administered 2020-10-26 (×3): 1 [IU] via INTRAVENOUS

## 2020-10-26 MED ORDER — PHENYLEPHRINE HCL (PRESSORS) 10 MG/ML IV SOLN
INTRAVENOUS | Status: DC | PRN
  Administered 2020-10-26: 200 ug via INTRAVENOUS

## 2020-10-26 MED ORDER — SUCCINYLCHOLINE CHLORIDE 200 MG/10ML IV SOSY
PREFILLED_SYRINGE | INTRAVENOUS | Status: AC
  Filled 2020-10-26: qty 10

## 2020-10-26 MED ORDER — SUGAMMADEX SODIUM 200 MG/2ML IV SOLN
INTRAVENOUS | Status: DC | PRN
  Administered 2020-10-26: 200 mg via INTRAVENOUS

## 2020-10-26 MED ORDER — FENTANYL CITRATE (PF) 100 MCG/2ML IJ SOLN
INTRAMUSCULAR | Status: DC | PRN
  Administered 2020-10-26 (×2): 25 ug via INTRAVENOUS
  Administered 2020-10-26: 50 ug via INTRAVENOUS

## 2020-10-26 MED ORDER — BUPIVACAINE-EPINEPHRINE 0.5% -1:200000 IJ SOLN
INTRAMUSCULAR | Status: DC | PRN
  Administered 2020-10-26: 30 mL

## 2020-10-26 MED ORDER — ACETAMINOPHEN 10 MG/ML IV SOLN
INTRAVENOUS | Status: AC
  Filled 2020-10-26: qty 100

## 2020-10-26 MED ORDER — LIDOCAINE HCL (CARDIAC) PF 100 MG/5ML IV SOSY
PREFILLED_SYRINGE | INTRAVENOUS | Status: DC | PRN
  Administered 2020-10-26: 60 mg via INTRAVENOUS

## 2020-10-26 MED ORDER — ONDANSETRON HCL 4 MG/2ML IJ SOLN
INTRAMUSCULAR | Status: DC | PRN
  Administered 2020-10-26: 4 mg via INTRAVENOUS

## 2020-10-26 MED ORDER — ROCURONIUM BROMIDE 10 MG/ML (PF) SYRINGE
PREFILLED_SYRINGE | INTRAVENOUS | Status: AC
  Filled 2020-10-26: qty 10

## 2020-10-26 MED ORDER — NEOMYCIN-POLYMYXIN B GU 40-200000 IR SOLN
Status: DC | PRN
  Administered 2020-10-26: 4 mL

## 2020-10-26 MED ORDER — PROPOFOL 10 MG/ML IV BOLUS
INTRAVENOUS | Status: AC
  Filled 2020-10-26: qty 20

## 2020-10-26 MED ORDER — LIDOCAINE HCL (PF) 2 % IJ SOLN
INTRAMUSCULAR | Status: AC
  Filled 2020-10-26: qty 10

## 2020-10-26 MED ORDER — ACETAMINOPHEN 10 MG/ML IV SOLN
INTRAVENOUS | Status: DC | PRN
  Administered 2020-10-26: 1000 mg via INTRAVENOUS

## 2020-10-26 MED ORDER — ONDANSETRON HCL 4 MG/2ML IJ SOLN
INTRAMUSCULAR | Status: AC
  Filled 2020-10-26: qty 2

## 2020-10-26 MED ORDER — HYDROMORPHONE HCL 1 MG/ML IJ SOLN
INTRAMUSCULAR | Status: DC | PRN
  Administered 2020-10-26: .1 mg via INTRAVENOUS

## 2020-10-26 MED ORDER — PROPOFOL 10 MG/ML IV BOLUS
INTRAVENOUS | Status: DC | PRN
  Administered 2020-10-26: 60 mg via INTRAVENOUS

## 2020-10-26 MED ORDER — FENTANYL CITRATE (PF) 100 MCG/2ML IJ SOLN: 25.0000 ug | INTRAMUSCULAR | Status: DC | PRN

## 2020-10-26 MED ORDER — SODIUM CHLORIDE 0.9 % IV SOLN
INTRAVENOUS | Status: DC | PRN
  Administered 2020-10-26: 30 ug/min via INTRAVENOUS

## 2020-10-26 MED ORDER — VASOPRESSIN 20 UNIT/ML IV SOLN
INTRAVENOUS | Status: AC
  Filled 2020-10-26: qty 1

## 2020-10-26 MED ORDER — NEOMYCIN-POLYMYXIN B GU 40-200000 IR SOLN
Status: AC
  Filled 2020-10-26: qty 4

## 2020-10-26 MED ORDER — SODIUM CHLORIDE 0.9 % IV SOLN
INTRAVENOUS | Status: AC
  Filled 2020-10-26: qty 10

## 2020-10-26 MED ORDER — LIDOCAINE HCL (PF) 2 % IJ SOLN
INTRAMUSCULAR | Status: AC
  Filled 2020-10-26: qty 2

## 2020-10-26 MED ORDER — VANCOMYCIN HCL 1000 MG IV SOLR
INTRAVENOUS | Status: AC
  Filled 2020-10-26: qty 1000

## 2020-10-26 MED ORDER — VANCOMYCIN HCL POWD
Status: DC | PRN
  Administered 2020-10-26: 1 mg via TOPICAL

## 2020-10-26 MED ORDER — ONDANSETRON HCL 4 MG/2ML IJ SOLN: 4.0000 mg | Freq: Once | INTRAMUSCULAR | Status: DC | PRN

## 2020-10-26 MED ORDER — BUPIVACAINE-EPINEPHRINE (PF) 0.5% -1:200000 IJ SOLN
INTRAMUSCULAR | Status: AC
  Filled 2020-10-26: qty 30

## 2020-10-26 MED ORDER — ROCURONIUM BROMIDE 100 MG/10ML IV SOLN
INTRAVENOUS | Status: DC | PRN
  Administered 2020-10-26: 15 mg via INTRAVENOUS
  Administered 2020-10-26: 30 mg via INTRAVENOUS
  Administered 2020-10-26: 10 mg via INTRAVENOUS

## 2020-10-26 MED ORDER — SODIUM CHLORIDE 0.9 % IV SOLN
1.0000 g | Freq: Three times a day (TID) | INTRAVENOUS | Status: AC
  Administered 2020-10-26 – 2020-10-27 (×2): 1 g via INTRAVENOUS
  Filled 2020-10-26 (×2): qty 10

## 2020-10-26 MED ORDER — LACTATED RINGERS IV SOLN: INTRAVENOUS | Status: DC | PRN

## 2020-10-26 MED ORDER — HYDROMORPHONE HCL 1 MG/ML IJ SOLN
INTRAMUSCULAR | Status: AC
  Filled 2020-10-26: qty 1

## 2020-10-26 SURGICAL SUPPLY — 59 items
BLADE SAGITTAL WIDE XTHICK NO (BLADE) ×2 IMPLANT
BNDG COHESIVE 4X5 TAN STRL (GAUZE/BANDAGES/DRESSINGS) ×2 IMPLANT
CEMENT BONE 40GM (Cement) ×4 IMPLANT
CEMENT RESTRICTOR DEPUY SZ 3 (Cement) ×2 IMPLANT
CEMENTRALIZER 9.25MM (Orthopedic Implant) ×2 IMPLANT
CHLORAPREP W/TINT 26 (MISCELLANEOUS) ×2 IMPLANT
COVER BACK TABLE REUSABLE LG (DRAPES) ×2 IMPLANT
COVER WAND RF STERILE (DRAPES) ×2 IMPLANT
DRAPE 3/4 80X56 (DRAPES) ×4 IMPLANT
DRAPE INCISE IOBAN 66X60 STRL (DRAPES) ×2 IMPLANT
DRAPE ORTHO SPLIT 77X108 STRL (DRAPES) ×4
DRAPE SURG 17X11 SM STRL (DRAPES) ×2 IMPLANT
DRAPE SURG ORHT 6 SPLT 77X108 (DRAPES) ×2 IMPLANT
DRSG AQUACEL AG ADV 3.5X10 (GAUZE/BANDAGES/DRESSINGS) ×2 IMPLANT
DRSG OPSITE POSTOP 4X10 (GAUZE/BANDAGES/DRESSINGS) ×2 IMPLANT
ELECT BLADE 6.5 EXT (BLADE) ×2 IMPLANT
ELECT CAUTERY BLADE 6.4 (BLADE) IMPLANT
ELECT REM PT RETURN 9FT ADLT (ELECTROSURGICAL) ×2
ELECTRODE REM PT RTRN 9FT ADLT (ELECTROSURGICAL) ×1 IMPLANT
GAUZE SPONGE 4X4 12PLY STRL (GAUZE/BANDAGES/DRESSINGS) ×2 IMPLANT
GAUZE XEROFORM 1X8 LF (GAUZE/BANDAGES/DRESSINGS) IMPLANT
GLOVE SURG ENC MOIS LTX SZ7.5 (GLOVE) ×2 IMPLANT
GLOVE SURG UNDER POLY LF SZ7.5 (GLOVE) ×2 IMPLANT
GOWN STRL REUS W/ TWL LRG LVL3 (GOWN DISPOSABLE) ×1 IMPLANT
GOWN STRL REUS W/TWL LRG LVL3 (GOWN DISPOSABLE) ×2
HEAD FEM UNIPOLAR 46 OD STRL (Hips) ×2 IMPLANT
HEMOVAC 400ML (MISCELLANEOUS) ×2
IV NS IRRIG 3000ML ARTHROMATIC (IV SOLUTION) ×2 IMPLANT
KIT DRAIN HEMOVAC JP 7FR 400ML (MISCELLANEOUS) ×1 IMPLANT
KIT TURNOVER KIT A (KITS) ×2 IMPLANT
MANIFOLD NEPTUNE II (INSTRUMENTS) ×2 IMPLANT
NDL HPO THNWL 1X22GA REG BVL (NEEDLE) ×1 IMPLANT
NEEDLE FILTER BLUNT 18X 1/2SAF (NEEDLE) ×1
NEEDLE FILTER BLUNT 18X1 1/2 (NEEDLE) ×1 IMPLANT
NEEDLE MAYO CATGUT SZ4 (NEEDLE) ×2 IMPLANT
NEEDLE SAFETY 22GX1 (NEEDLE) ×2
NS IRRIG 1000ML POUR BTL (IV SOLUTION) ×2 IMPLANT
PACK HIP PROSTHESIS (MISCELLANEOUS) ×2 IMPLANT
PILLOW ABDUCTION FOAM SM (MISCELLANEOUS) ×2 IMPLANT
PRESSURIZER CEMENT PROX FEM SM (MISCELLANEOUS) ×2 IMPLANT
PULSAVAC PLUS IRRIG FAN TIP (DISPOSABLE) ×2
SPACER FEM TAPERED +5 12/14 (Hips) ×2 IMPLANT
STAPLER SKIN PROX 35W (STAPLE) ×2 IMPLANT
STEM SUMMIT CEMENTED BASIC SZ3 (Hips) ×1 IMPLANT
SUMMIT CEMENT BASIC SZ3 (Hips) ×2 IMPLANT
SUT ETHIBOND 2 V 37 (SUTURE) ×2 IMPLANT
SUT QUILL PDO 0 36 36 VIOLET (SUTURE) ×2 IMPLANT
SUT TICRON 2-0 30IN 311381 (SUTURE) ×8 IMPLANT
SUT VIC AB 0 CT1 36 (SUTURE) ×2 IMPLANT
SUT VIC AB 2-0 CT1 27 (SUTURE) ×4
SUT VIC AB 2-0 CT1 TAPERPNT 27 (SUTURE) ×2 IMPLANT
SUT VIC AB 2-0 CT2 27 (SUTURE) ×4 IMPLANT
SYR 10ML LL (SYRINGE) ×2 IMPLANT
TAPE MICROFOAM 4IN (TAPE) ×2 IMPLANT
TAPE TRANSPORE STRL 2 31045 (GAUZE/BANDAGES/DRESSINGS) ×2 IMPLANT
TIP BRUSH PULSAVAC PLUS 24.33 (MISCELLANEOUS) ×2 IMPLANT
TIP FAN IRRIG PULSAVAC PLUS (DISPOSABLE) ×1 IMPLANT
TRAY FOLEY SLVR 16FR LF STAT (SET/KITS/TRAYS/PACK) ×2 IMPLANT
TUBE SUCT KAM VAC (TUBING) IMPLANT

## 2020-10-26 NOTE — Anesthesia Procedure Notes (Signed)
Procedure Name: Intubation Date/Time: 10/26/2020 1:08 PM Performed by: Hedda Slade, CRNA Pre-anesthesia Checklist: Patient identified, Patient being monitored, Timeout performed, Emergency Drugs available and Suction available Patient Re-evaluated:Patient Re-evaluated prior to induction Oxygen Delivery Method: Circle system utilized Preoxygenation: Pre-oxygenation with 100% oxygen Induction Type: IV induction Ventilation: Mask ventilation without difficulty Laryngoscope Size: 3 and McGraph Grade View: Grade I Tube type: Oral Tube size: 7.0 mm Number of attempts: 1 Airway Equipment and Method: Stylet Placement Confirmation: ETT inserted through vocal cords under direct vision,  positive ETCO2 and breath sounds checked- equal and bilateral Secured at: 21 cm Tube secured with: Tape Dental Injury: Teeth and Oropharynx as per pre-operative assessment

## 2020-10-26 NOTE — Progress Notes (Addendum)
Progress Note    Roberta [name]  ZTI:[id number] DOB: [birth year]  DOA: 10/24/2020 PCP: Gayland Curry, MD      Brief Narrative:    Medical records reviewed and are as summarized below:  Roberta [name] is a 75 y.o. 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 She presented to the hospital because of severe pain in the left hip after a fall at home..  Work-up revealed left subcapital displaced femoral neck fracture.  She was evaluated by the orthopedic surgeon.  Assessment/Plan:   Principal Problem:   Closed left subtrochanteric femur fracture (HCC) Active Problems:   COPD (chronic obstructive pulmonary disease) (HCC)   PAD (peripheral artery disease) (HCC)   GERD (gastroesophageal reflux disease)   Tobacco abuse   Chronic systolic CHF (congestive heart failure) (HCC)   Malnutrition of moderate degree   Body mass index is 22.34 kg/m.   Left subcapital displaced femoral neck fracture: Analgesics as needed for pain.  Plan for left hip surgery today.  Follow-up with orthopedic surgeon.    Cardiomyopathy/chronic systolic and diastolic CHF/SVT: Patient had short run of SVT on the morning of 10/25/2020.  No further episodes.  Continue metoprolol.  Hold Lasix and dapagliflozin today.  2D echo on 10/13/2020 showed EF estimated at 25 to 30%.  COPD: Compensated.  Continue bronchodilators as needed  Other comorbidities include AAA s/p endovascular repair/stent graft in 2018, PVD, vitamin B12 deficiency, breast cancer status post mastectomy in 2002, history of small cell lung cancer s/p lobectomy about 3 years ago, tobacco use disorder.     Diet Order             Diet NPO time specified Except for: Sips with Meds  Diet effective midnight                    Consultants:  Cardiologist  Orthopedic surgeon  Procedures:  None    Medications:   . atorvastatin  10 mg Oral QHS  . ceFAZolin  1 g Other To OR  . Chlorhexidine Gluconate Cloth  6 each Topical Daily  . dapagliflozin propanediol  10 mg Oral Daily  . feeding supplement  237 mL Oral BID BM  . furosemide  20 mg Oral Q M,W,F  . metoprolol tartrate  25 mg Oral Q6H  . mometasone-formoterol  2 puff Inhalation BID  . multivitamin with minerals  1 tablet Oral Daily  . nicotine  14 mg Transdermal Daily  . pantoprazole  40 mg Oral Daily  . senna  1 tablet Oral BID  . vitamin B-12  1,000 mcg Oral Daily   Continuous Infusions: . methocarbamol (ROBAXIN) IV       Anti-infectives (From admission, onward)   Start     Dose/Rate Route Frequency Ordered Stop   10/26/20 1200  ceFAZolin (ANCEF) powder 1 g        1 g Other To Surgery 10/25/20 0933 10/27/20 1200             Family Communication/Anticipated D/C date and plan/Code Status   DVT prophylaxis: SCDs Start: 10/24/20 8250     Code Status: DNR  Family Communication: None Disposition Plan:    Status is: Inpatient  Remains inpatient appropriate because:Inpatient level  of care appropriate due to severity of illness   Dispo: The patient is from: Home              Anticipated d/c is to: SNF              Patient currently is not medically stable to d/c.   Difficult to place patient No           Subjective:   C/o pain in left hip.  No shortness of breath or chest pain.  Objective:    Vitals:   10/25/20 1936 10/26/20 0022 10/26/20 0511 10/26/20 0724  BP: 135/86 119/73 119/77 115/66  Pulse: (!) 101 87 90 91  Resp: 17 17 17 16   Temp: 98 F (36.7 C) (!) 97.5 F (36.4 C) 98 F (36.7 C) (!) 97.5 F (36.4 C)  TempSrc: Oral Oral Oral Oral  SpO2: 96% 95% 97% 95%  Weight:      Height:       No data  found.   Intake/Output Summary (Last 24 hours) at 10/26/2020 0830 Last data filed at 10/25/2020 1829 Gross per 24 hour  Intake --  Output 700 ml  Net -700 ml   Filed Weights   10/24/20 0454  Weight: 64.7 kg    Exam:  GEN: NAD SKIN: Warm and dry EYES: No pallor or icterus ENT: MMM CV: RRR PULM: CTA B ABD: soft, ND, NT, +BS CNS: AAO x 2 (person and place), non focal EXT: Right hip tenderness         Data Reviewed:   I have personally reviewed following labs and imaging studies:  Labs: Labs show the following:   Basic Metabolic Panel: Recent Labs  Lab 10/24/20 0459 10/25/20 0505  NA 133* 131*  K 4.1 4.0  CL 95* 96*  CO2 28 24  GLUCOSE 104* 81  BUN 18 21  CREATININE 0.95 0.78  CALCIUM 8.8* 8.4*   GFR Estimated Creatinine Clearance: 60 mL/min (by C-G formula based on SCr of 0.78 mg/dL). Liver Function Tests: Recent Labs  Lab 10/24/20 0459  AST 30  ALT 55*  ALKPHOS 79  BILITOT 0.7  PROT 6.5  ALBUMIN 3.0*   No results for input(s): LIPASE, AMYLASE in the last 168 hours. No results for input(s): AMMONIA in the last 168 hours. Coagulation profile Recent Labs  Lab 10/24/20 0459  INR 0.9    CBC: Recent Labs  Lab 10/24/20 0459 10/25/20 0505  WBC 17.6* 17.7*  NEUTROABS 14.4*  --   HGB 11.5* 10.6*  HCT 36.5 32.7*  MCV 91.5 89.8  PLT 356 306   Cardiac Enzymes: No results for input(s): CKTOTAL, CKMB, CKMBINDEX, TROPONINI in the last 168 hours. BNP (last 3 results) No results for input(s): PROBNP in the last 8760 hours. CBG: No results for input(s): GLUCAP in the last 168 hours. D-Dimer: No results for input(s): DDIMER in the last 72 hours. Hgb A1c: No results for input(s): HGBA1C in the last 72 hours. Lipid Profile: No results for input(s): CHOL, HDL, LDLCALC, TRIG, CHOLHDL, LDLDIRECT in the last 72 hours. Thyroid function studies: No results for input(s): TSH, T4TOTAL, T3FREE, THYROIDAB in the last 72 hours.  Invalid input(s):  FREET3 Anemia work up: No results for input(s): VITAMINB12, FOLATE, FERRITIN, TIBC, IRON, RETICCTPCT in the last 72 hours. Sepsis Labs: Recent Labs  Lab 10/24/20 0459 10/25/20 0505  WBC 17.6* 17.7*    Microbiology Recent Results (from the past 240 hour(s))  Resp Panel by RT-PCR (Flu A&B, Covid) Nasopharyngeal  Swab     Status: None   Collection Time: 10/24/20  4:59 AM   Specimen: Nasopharyngeal Swab; Nasopharyngeal(NP) swabs in vial transport medium  Result Value Ref Range Status   SARS Coronavirus 2 by RT PCR NEGATIVE NEGATIVE Final    Comment: (NOTE) SARS-CoV-2 target nucleic acids are NOT DETECTED.  The SARS-CoV-2 RNA is generally detectable in upper respiratory specimens during the acute phase of infection. The lowest concentration of SARS-CoV-2 viral copies this assay can detect is 138 copies/mL. A negative result does not preclude SARS-Cov-2 infection and should not be used as the sole basis for treatment or other patient management decisions. A negative result may occur with  improper specimen collection/handling, submission of specimen other than nasopharyngeal swab, presence of viral mutation(s) within the areas targeted by this assay, and inadequate number of viral copies(<138 copies/mL). A negative result must be combined with clinical observations, patient history, and epidemiological information. The expected result is Negative.  Fact Sheet for Patients:  EntrepreneurPulse.com.au  Fact Sheet for Healthcare Providers:  IncredibleEmployment.be  This test is no t yet approved or cleared by the [place] FDA and  has been authorized for detection and/or diagnosis of SARS-CoV-2 by FDA under an Emergency Use Authorization (EUA). This EUA will remain  in effect (meaning this test can be used) for the duration of the COVID-19 declaration under Section 564(b)(1) of the Act, 21 U.S.C.section 360bbb-3(b)(1), unless the authorization  is terminated  or revoked sooner.       Influenza A by PCR NEGATIVE NEGATIVE Final   Influenza B by PCR NEGATIVE NEGATIVE Final    Comment: (NOTE) The Xpert Xpress SARS-CoV-2/FLU/RSV plus assay is intended as an aid in the diagnosis of influenza from Nasopharyngeal swab specimens and should not be used as a sole basis for treatment. Nasal washings and aspirates are unacceptable for Xpert Xpress SARS-CoV-2/FLU/RSV testing.  Fact Sheet for Patients: EntrepreneurPulse.com.au  Fact Sheet for Healthcare Providers: IncredibleEmployment.be  This test is not yet approved or cleared by the [place] FDA and has been authorized for detection and/or diagnosis of SARS-CoV-2 by FDA under an Emergency Use Authorization (EUA). This EUA will remain in effect (meaning this test can be used) for the duration of the COVID-19 declaration under Section 564(b)(1) of the Act, 21 U.S.C. section 360bbb-3(b)(1), unless the authorization is terminated or revoked.  Performed at [facility], [address] [place], [place] [address]     Procedures and diagnostic studies:  No results found.             LOS: 2 days   Aireona Torelli  [facility]   Pager on [email or web address]. If 7PM-7AM, please contact night-coverage at [email or web address]     10/26/2020, 8:30 AM

## 2020-10-26 NOTE — Plan of Care (Signed)

## 2020-10-26 NOTE — Op Note (Signed)
10/26/2020  3:37 PM  PATIENT:  Roberta [name]   MRN: [id number]  PRE-OPERATIVE DIAGNOSIS:  Left Femoral neck Fracture  POST-OPERATIVE DIAGNOSIS:  Left Femoral neck Fracture  PROCEDURE:  Procedure(s): ARTHROPLASTY BIPOLAR HIP (HEMIARTHROPLASTY)  PREOPERATIVE INDICATIONS:    Roberta [name] is an 75 y.o. female who was admitted with a diagnosis of Left Hip Fracture.  I have recommended surgical fixation with hemiarthroplasty for this injury. I have explained the surgery and the postoperative course to the patient and their family who have agreed with surgical management of this fracture.    The risks benefits and alternatives were discussed with the patient and their family including but not limited to the risks of  infection requiring removal of the prosthesis, bleeding requiring blood transfusion, nerve injury especially to the sciatic nerve leading to foot drop or lower extremity numbness, periprosthetic fracture, dislocation leg length discrepancy, change in lower extremity rotation persistent hip pain, loosening or failure of the components and the need for revision surgery. Medical risks include but are not limited to DVT and pulmonary embolism, myocardial infarction, stroke, pneumonia, respiratory failure and death.  OPERATIVE REPORT     SURGEON:  Dyane Dustman, MD    ASSISTANT: None    ANESTHESIA: General    COMPLICATIONS:  None.   SPECIMEN: Femoral head to pathology    COMPONENTS: De[place] size 3 cemented stem, +30m neck, 468mhead    PROCEDURE IN DETAIL:   The patient was met in the holding area and  identified.  The appropriate hip was identified and marked at the operative site after verbally confirming with the patient that this was the correct site of surgery.  The patient was then transported to the OR  and  underwent general anesthesia.  The patient was then placed in the lateral decubitus position with the operative side up and secured on the operating  room table with a pegboard and all bony prominences were adequately padded. This included an axillary roll and additional padding around the nonoperative leg to prevent compression to the common peroneal nerve.    The operative lower extremity was prepped and draped in a sterile fashion.  A time out was performed prior to incision to verify patient's name, date of birth, medical record number, correct site of surgery correct procedure to be performed. The timeout was also used to verify the patient received antibiotics now appropriate instruments, implants and radiographic studies were available in the room. Once all in attendance were in agreement case began.    A posterolateral approach was utilized via sharp dissection  carried down to the subcutaneous tissue.  Bleeding vessels were coagulated using electrocautery.  The fascia lata was identified and incised along the length of the skin incision.  The gluteus maximus muscle was then split in line with its fibers. Self-retaining retractors were  inserted.  With the hip internally rotated, the short external rotators  were identified and removed from the posterior attachment from the greater trochanter. The piriformis was tagged for later repair. The capsule was identified and a T-shaped capsulotomy was performed. The capsule was tagged with #2 Tycron for later repair.  The femoral neck fracture was exposed, and the femoral head was removed using a corkscrew device. This was measured to be 46 mm in diameter. The attention was then turned to proximal femur preparation.  An oscillating saw was used to perform a proximal femoral osteotomy 1 fingerbreadth above the lesser trochanter. The trial 46 mm femoral head was  placed into the acetabulum and had an excellent suction fit. The attention was then turned back to femoral preparation.    A femoral skid and Cobra retractor were placed under the femoral neck to allow for adequate visualization. A box osteotome was  used to make the initial entry into the proximal femur. A single hand reamer was used to prepare the femoral canal. A T-shaped femoral canal sounder was then used to ensure no penetration femoral cortex had occurred during reaming. The proximal femur was then sequentially broached by hand. A size 3 femoral trial broach was found to have best medial to lateral canal fit. Once adequate mediolateral canal fill was achieved the trial femoral broach, neck, and head was assembled and the hip was reduced. It was found to have excellent stability, equivalent leg lengths with functional range of motion. The trial components were then removed.  I copiously irrigated the femoral canal and then cemented the real femoral prosthesis in a standard fashion.  The hip was then reduced and taken through functional range of motion and found to have excellent stability. Leg lengths were restored. The hip joint was copiously irrigated.   A soft tissue repair of the capsule and external rotators was performed using #2 Tycron Excellent posterior capsular repair was achieved. The fascia lata was then closed with interrupted 0 Vicryl suture as well as 0 Quill barbed suture. The subcutaneous tissues were closed with 2-0 Vicryl and the skin approximated with staples.   The patient was then placed supine on the operative table. Leg lengths were checked clinically and found to be equivalent. An abduction pillow was placed between the lower extremities. The patient was then transferred to a hospital bed and brought to the PACU in stable condition. I was scrubbed and present the entire case and all sharp and instrument counts were correct at the conclusion of the case.  Postop plan: 1.  Patient will be weightbearing as tolerated on the left lower extremity with posterior hip precautions x3 months 2.  To resume DVT prophylaxis on postop day #1, Lovenox 3.  Keep Aquacel dressing clean and dry 4.  Follow-up in clinic in 2 weeks for staple  removal and x-rays   Dyane Dustman, MD Orthopedic Surgeon

## 2020-10-26 NOTE — Anesthesia Postprocedure Evaluation (Signed)
Anesthesia Post Note  Patient: Roberta [name]  Procedure(s) Performed: ARTHROPLASTY BIPOLAR HIP (HEMIARTHROPLASTY) (Left Hip)  Patient location during evaluation: PACU Anesthesia Type: General Level of consciousness: awake and alert Pain management: pain level controlled Vital Signs Assessment: post-procedure vital signs reviewed and stable Respiratory status: spontaneous breathing and respiratory function stable Cardiovascular status: stable Anesthetic complications: no   No complications documented.   Last Vitals:  Vitals:   10/26/20 1136 10/26/20 1523  BP: 122/75 (!) 137/108  Pulse: 86 96  Resp: 17 (!) 21  Temp: 36.6 C 36.7 C  SpO2: 94% 100%    Last Pain:  Vitals:   10/26/20 1136  TempSrc: Temporal  PainSc: 8                  Nitesh Pitstick K

## 2020-10-26 NOTE — Progress Notes (Signed)
Pt is back from surgery. md notified of diastolic bp 333. Will continue to monitor. Iv fluids d/cd due to HF hx and high bp

## 2020-10-26 NOTE — Transfer of Care (Signed)
Immediate Anesthesia Transfer of Care Note  Patient: Roberta [name]  Procedure(s) Performed: ARTHROPLASTY BIPOLAR HIP (HEMIARTHROPLASTY) (Left Hip)  Patient Location: PACU  Anesthesia Type:General  Level of Consciousness: awake and drowsy  Airway & Oxygen Therapy: Patient Spontanous Breathing and Patient connected to face mask oxygen  Post-op Assessment: Report given to RN and Post -op Vital signs reviewed and stable  Post vital signs: stable  Last Vitals:  Vitals Value Taken Time  BP 137/108 10/26/20 1523  Temp 36.7 C 10/26/20 1523  Pulse 94 10/26/20 1527  Resp 19 10/26/20 1527  SpO2 100 % 10/26/20 1527  Vitals shown include unvalidated device data.  Last Pain:  Vitals:   10/26/20 1136  TempSrc: Temporal  PainSc: 8          Complications: No complications documented.

## 2020-10-27 ENCOUNTER — Encounter: Payer: Self-pay | Admitting: Orthopaedic Surgery

## 2020-10-27 DIAGNOSIS — E871 Hypo-osmolality and hyponatremia: Secondary | ICD-10-CM | POA: Diagnosis not present

## 2020-10-27 DIAGNOSIS — D72828 Other elevated white blood cell count: Secondary | ICD-10-CM

## 2020-10-27 DIAGNOSIS — S7225XD Nondisplaced subtrochanteric fracture of left femur, subsequent encounter for closed fracture with routine healing: Secondary | ICD-10-CM | POA: Diagnosis not present

## 2020-10-27 MED ORDER — ENOXAPARIN SODIUM 40 MG/0.4ML IJ SOSY
40.0000 mg | PREFILLED_SYRINGE | INTRAMUSCULAR | Status: DC
  Administered 2020-10-27 – 2020-10-29 (×3): 40 mg via SUBCUTANEOUS
  Filled 2020-10-27 (×3): qty 0.4

## 2020-10-27 MED FILL — Vancomycin HCl For IV Soln 1 GM (Base Equivalent): INTRAVENOUS | Qty: 1000 | Status: AC

## 2020-10-27 NOTE — Evaluation (Signed)
Physical Therapy Evaluation Patient Details Name: Roberta [name] MRN: [id number] DOB: [birth year] Today's Date: 10/27/2020   History of Present Illness  Pt admitted for L subtrochentric femur fx secondary to fall and is now s/p L hip hemi. History includes CHF, small lung cancer s/p lobectomy, breast cancer, COPD no on O2.  Clinical Impression  Pt is a pleasant 75 year old female who was admitted for L hip hemi s/p fall. Pt performs bed mobility/transfers with mod assist and unable to perform ambulation. Pt demonstrates deficits with strength/mobility/pain. Pt on 1.5L of O2 acutely, removed for mobility, however sats decreased to 87%. Replaced with sats improving to 93%. Able to participate in limited there-ex. Would benefit from skilled PT to address above deficits and promote optimal return to PLOF; recommend transition to STR upon discharge from acute hospitalization.     Follow Up Recommendations SNF    Equipment Recommendations  None recommended by PT    Recommendations for Other Services       Precautions / Restrictions Precautions Precautions: Posterior Hip;Fall Precaution Booklet Issued: No Precaution Comments: post hip Restrictions Weight Bearing Restrictions: Yes LLE Weight Bearing: Weight bearing as tolerated      Mobility  Bed Mobility Overal bed mobility: Needs Assistance Bed Mobility: Supine to Sit     Supine to sit: Mod assist     General bed mobility comments: needs assist with B LE and cues for avoiding hip precautions. Once seated at EOB, able to demonstrate upright posture.    Transfers Overall transfer level: Needs assistance Equipment used: Rolling walker (2 wheeled) Transfers: Sit to/from Stand Sit to Stand: Mod assist         General transfer comment: needs assist to push from seated surface. Able to stand with forward flexed posture. on 2nd attempt standing, able to perform SPT to recliner with max assist  Ambulation/Gait              General Gait Details: attempted to ambulate, however unable to take steps due to weakness and pain  Stairs            Wheelchair Mobility    Modified Rankin (Stroke Patients Only)       Balance Overall balance assessment: Needs assistance Sitting-balance support: Feet supported;Bilateral upper extremity supported Sitting balance-Leahy Scale: Good     Standing balance support: Bilateral upper extremity supported Standing balance-Leahy Scale: Poor Standing balance comment: forward flexed posture                             Pertinent Vitals/Pain Pain Assessment: 0-10 Pain Score: 2  Pain Location: L LE Pain Descriptors / Indicators: Grimacing;Discomfort Pain Intervention(s): Limited activity within patient's tolerance;Premedicated before session    Home Living Family/patient expects to be discharged to:: Private residence Living Arrangements: Children (daughter and grand daughter) Available Help at Discharge: Available 24 hours/day Type of Home: House Home Access: Level entry     Home Layout: One level Home Equipment: Environmental consultant - 2 wheels;Bedside commode;Cane - single point;Tub bench;Walker - 4 wheels      Prior Function Level of Independence: Independent with assistive device(s)         Comments: reports she uses RW at all times. Reports previous falls     Hand Dominance        Extremity/Trunk Assessment   Upper Extremity Assessment Upper Extremity Assessment: Generalized weakness (B UE grossly 4/5)    Lower Extremity Assessment Lower Extremity Assessment: Generalized  weakness (L LE grossly 3/5; R LE grossly 3/5)       Communication   Communication: HOH  Cognition Arousal/Alertness: Awake/alert Behavior During Therapy: WFL for tasks assessed/performed Overall Cognitive Status: Within Functional Limits for tasks assessed                                        General Comments      Exercises Other Exercises Other  Exercises: supine ther-ex performed on L LE including AP, quad sets, SLRs, and hip ab/add, 10 reps performed with mod assist   Assessment/Plan    PT Assessment Patient needs continued PT services  PT Problem List Decreased strength;Decreased activity tolerance;Decreased balance;Decreased mobility;Decreased safety awareness;Decreased cognition       PT Treatment Interventions DME instruction;Gait training;Balance training;Stair training;Functional mobility training;Therapeutic activities;Therapeutic exercise;Patient/family education    PT Goals (Current goals can be found in the Care Plan section)  Acute Rehab PT Goals Patient Stated Goal: to get stronger PT Goal Formulation: With patient Time For Goal Achievement: 11/10/20 Potential to Achieve Goals: Good    Frequency BID   Barriers to discharge        Co-evaluation               AM-PAC PT "6 Clicks" Mobility  Outcome Measure Help needed turning from your back to your side while in a flat bed without using bedrails?: A Little Help needed moving from lying on your back to sitting on the side of a flat bed without using bedrails?: A Lot Help needed moving to and from a bed to a chair (including a wheelchair)?: A Lot Help needed standing up from a chair using your arms (e.g., wheelchair or bedside chair)?: A Lot Help needed to walk in hospital room?: Total Help needed climbing 3-5 steps with a railing? : Total 6 Click Score: 11    End of Session Equipment Utilized During Treatment: Gait belt;Oxygen Activity Tolerance: Patient tolerated treatment well Patient left: in chair;with chair alarm set Nurse Communication: Mobility status PT Visit Diagnosis: Unsteadiness on feet (R26.81);Difficulty in walking, not elsewhere classified (R26.2);Muscle weakness (generalized) (M62.81)    Time: 6568-1275 PT Time Calculation (min) (ACUTE ONLY): 22 min   Charges:   PT Evaluation $PT Eval Low Complexity: 1 Low PT  Treatments $Therapeutic Exercise: 8-22 mins        Roberta [name], PT, DPT [phone]   Roberta [name] 10/27/2020, 11:17 AM

## 2020-10-27 NOTE — Evaluation (Signed)
Occupational Therapy Evaluation Patient Details Name: KAYCEE HAYCRAFT MRN: [id number] DOB: 30-Jan-1946 Today's Date: 10/27/2020    History of Present Illness Pt admitted for L subtrochentric femur fx secondary to fall and is now s/p L hip hemi. History includes CHF, small lung cancer s/p lobectomy, breast cancer, COPD not on O2 at home.   Clinical Impression   Pt seen for OT evaluation this date, POD#1 from above surgery. Prior to admission, pt was MOD-I with RW for functional mobility. Pt reports requiring some assist for LB dressing. Pt was able to independently sponge-bathe prior to admission. Pt is eager to return to PLOF with less pain and improved safety and independence. Pt currently requires MOD A for sit>stand transfers, MOD A and b/l UE support on RW for static standing balance (unable to safely perform standing ADLs this date), SET-UP assist/SUPERVISION for seated grooming tasks, and MAX A for LB dressing while in seated position due to pain and limited AROM of L hip. Pt unable to recall posterior total hip precautions at start of session and unable to verbalize how to implement during ADL and mobility. Pt instructed in posterior total hip precautions and how to implement, self care skills, falls prevention strategies, and DME/AE for LB bathing and dressing tasks. Handout provided. At end of session, pt able to recall 2/3 posterior total hip precautions. Pt would benefit from additional instruction in self care skills and techniques to help maintain precautions, with or without assistive devices, to support recall and carryover prior to discharge. Recommend SNF upon discharge.      Follow Up Recommendations  SNF    Equipment Recommendations  None recommended by OT       Precautions / Restrictions Precautions Precautions: Posterior Hip;Fall Precaution Booklet Issued: No Precaution Comments: post hip Restrictions Weight Bearing Restrictions: Yes LLE Weight Bearing: Weight bearing as  tolerated      Mobility Bed Mobility      General bed mobility comments: not assessed as pt was in recliner at beginning/end of session    Transfers Overall transfer level: Needs assistance Equipment used: Rolling walker (2 wheeled) Transfers: Sit to/from Stand Sit to Stand: Mod assist         General transfer comment: x3 bouts. Requires assist to lift hips from surface. Pt able to stand with forward flexed posture with MOD A. Required MOD verbal cues for safe hand placement    Balance Overall balance assessment: Needs assistance Sitting-balance support: No upper extremity supported;Feet supported Sitting balance-Leahy Scale: Good Sitting balance - Comments: Good sitting balance while performing seated grooming tasks in recliner   Standing balance support: Bilateral upper extremity supported;During functional activity Standing balance-Leahy Scale: Poor Standing balance comment: Pt with forward flexed posture, requiring MOD A to maintain standing                           ADL either performed or assessed with clinical judgement   ADL Overall ADL's : Needs assistance/impaired     Grooming: Applying deodorant;Brushing hair;Supervision/safety;Set up;Sitting               Lower Body Dressing: Maximal assistance;Adhering to hip precautions Lower Body Dressing Details (indicate cue type and reason): to don socks                     Vision Patient Visual Report: No change from baseline              Pertinent  Vitals/Pain Pain Assessment: 0-10 Pain Score: 6  Pain Location: L LE Pain Descriptors / Indicators: Grimacing;Discomfort Pain Intervention(s): Limited activity within patient's tolerance;Repositioned;Monitored during session        Extremity/Trunk Assessment Upper Extremity Assessment Upper Extremity Assessment: Generalized weakness (B UE grossly 4/5)   Lower Extremity Assessment Lower Extremity Assessment: Defer to PT evaluation        Communication Communication Communication: HOH   Cognition Arousal/Alertness: Awake/alert Behavior During Therapy: WFL for tasks assessed/performed Overall Cognitive Status: Difficult to assess                                 General Comments: Able to recall 2/3 posterior precautions at end of session. Requires increase processing time   General Comments  Pt on 3L of O2. SpO2 >91% throughout    Exercises General Exercises - Upper Extremity Shoulder Flexion: AROM;Strengthening;Both;10 reps;Seated Chair Push Up: AROM;Both;10 reps;Seated General Exercises - Lower Extremity Ankle Circles/Pumps: AROM;Both;10 reps;Seated Long Arc Quad: AROM;Both;10 reps;Seated Hip Flexion/Marching: AROM;Strengthening;Both;Seated Other Exercises Other Exercises: Educated pt on role of OT, POC, and d/c recommendations, with pt verbalizing understanding        Home Living Family/patient expects to be discharged to:: Private residence Living Arrangements: Children (daughter and grand daughter) Available Help at Discharge: Available 24 hours/day;Family Type of Home: House Home Access: Level entry Entrance Stairs-Number of Steps: 5   Home Layout: One level     Bathroom Shower/Tub: Tub/shower unit         Home Equipment: Environmental consultant - 2 wheels;Bedside commode;Cane - single point;Tub bench;Walker - 4 wheels          Prior Functioning/Environment Level of Independence: Independent with assistive device(s)  Gait / Transfers Assistance Needed: Since last admission, pt has been using RW for functional mobility. Pt endorses previous falls ADL's / Homemaking Assistance Needed: Pt reports spongebathing at baseline. Pt reports receiving assist for LB dressing   Comments: reports she uses RW at all times. Reports previous falls        OT Problem List: Decreased strength;Decreased knowledge of use of DME or AE;Decreased coordination;Decreased activity tolerance;Decreased safety  awareness;Impaired balance (sitting and/or standing);Cardiopulmonary status limiting activity;Decreased knowledge of precautions;Pain      OT Treatment/Interventions: Self-care/ADL training;Therapeutic exercise;Energy conservation;Therapeutic activities;DME and/or AE instruction;Patient/family education    OT Goals(Current goals can be found in the care plan section) Acute Rehab OT Goals Patient Stated Goal: to get stronger OT Goal Formulation: With patient Time For Goal Achievement: 11/10/20 Potential to Achieve Goals: Good ADL Goals Pt Will Perform Lower Body Dressing: with min assist;with adaptive equipment;sit to/from stand Pt Will Transfer to Toilet: with min assist;stand pivot transfer;bedside commode Pt Will Perform Toileting - Clothing Manipulation and hygiene: with min assist;sitting/lateral leans  OT Frequency: Min 1X/week    AM-PAC OT "6 Clicks" Daily Activity     Outcome Measure Help from another person eating meals?: None Help from another person taking care of personal grooming?: A Little Help from another person toileting, which includes using toliet, bedpan, or urinal?: A Lot Help from another person bathing (including washing, rinsing, drying)?: A Lot Help from another person to put on and taking off regular upper body clothing?: A Little Help from another person to put on and taking off regular lower body clothing?: A Lot 6 Click Score: 16   End of Session Equipment Utilized During Treatment: Gait belt;Rolling walker;Oxygen Nurse Communication: Mobility status  Activity Tolerance: Patient tolerated  treatment well Patient left: with call bell/phone within reach;in bed  OT Visit Diagnosis: Other abnormalities of gait and mobility (R26.89);Muscle weakness (generalized) (M62.81)                Time: 1120-1208 OT Time Calculation (min): 48 min Charges:  OT General Charges $OT Visit: 1 Visit OT Evaluation $OT Eval Moderate Complexity: 1 Mod OT Treatments $Self  Care/Home Management : 8-22 mins $Therapeutic Activity: 23-37 mins  Fredirick Maudlin, OTR/L [place]

## 2020-10-27 NOTE — Progress Notes (Signed)
Physical Therapy Treatment Patient Details Name: Roberta [name] MRN: [id number] DOB: December 14, 1945 Today's Date: 10/27/2020    History of Present Illness Pt admitted for L subtrochentric femur fx secondary to fall and is now s/p L hip hemi. History includes CHF, small lung cancer s/p lobectomy, breast cancer, COPD not on O2 at home.    PT Comments    Pt is making gradual progress towards goals with ability to SPT to bed. Still required mod assist. Good attempt at therex. Still reporting pain with activity. Educated on hip precautions. Will continue to progress towards goals.   Follow Up Recommendations  SNF     Equipment Recommendations  None recommended by PT    Recommendations for Other Services       Precautions / Restrictions Precautions Precautions: Posterior Hip;Fall Precaution Booklet Issued: Yes (comment) Precaution Comments: post hip Restrictions Weight Bearing Restrictions: Yes LLE Weight Bearing: Weight bearing as tolerated    Mobility  Bed Mobility Overal bed mobility: Needs Assistance Bed Mobility: Supine to Sit;Sit to Supine       Sit to supine: Mod assist   General bed mobility comments: needs assist for B LE management and repositioning    Transfers Overall transfer level: Needs assistance Equipment used: Rolling walker (2 wheeled) Transfers: Sit to/from Stand Sit to Stand: Mod assist         General transfer comment: 1st transfer with mod assist using RW. 2nd transfer with HHA and able to perform SPT to bed.  Ambulation/Gait             General Gait Details: unable to ambulate this date   Stairs             Wheelchair Mobility    Modified Rankin (Stroke Patients Only)       Balance Overall balance assessment: Needs assistance Sitting-balance support: No upper extremity supported;Feet supported Sitting balance-Leahy Scale: Good Sitting balance - Comments: Good sitting balance while performing seated grooming tasks in  recliner   Standing balance support: Bilateral upper extremity supported;During functional activity Standing balance-Leahy Scale: Fair Standing balance comment: Pt with forward flexed posture, requiring MOD A to maintain standing                            Cognition Arousal/Alertness: Awake/alert Behavior During Therapy: WFL for tasks assessed/performed Overall Cognitive Status: Difficult to assess                                 General Comments: Able to recall 2/3 posterior precautions at end of session. Requires increase processing time      Exercises General Exercises - Upper Extremity Shoulder Flexion: AROM;Strengthening;Both;10 reps;Seated Chair Push Up: AROM;Both;10 reps;Seated General Exercises - Lower Extremity Ankle Circles/Pumps: AROM;Both;10 reps;Seated Long Arc Quad: AROM;Both;10 reps;Seated Hip Flexion/Marching: AROM;Strengthening;Both;Seated Other Exercises Other Exercises: Educated pt on role of OT, POC, and d/c recommendations, with pt verbalizing understanding Other Exercises: supine ther-ex performed on L LE including AP, glut sets, quad sets, hip abd/add, and SAQ. All ther-ex performed x 10 reps with mod assist    General Comments General comments (skin integrity, edema, etc.): Pt on 3L of O2. SpO2 >91% throughout      Pertinent Vitals/Pain Pain Assessment: 0-10 Pain Score: 4  Pain Location: L LE Pain Descriptors / Indicators: Grimacing;Discomfort Pain Intervention(s): Limited activity within patient's tolerance    Home Living Family/patient expects to  be discharged to:: Private residence Living Arrangements: Children (daughter and grand daughter) Available Help at Discharge: Available 24 hours/day;Family Type of Home: House Home Access: Level entry   Home Layout: One level Home Equipment: Environmental consultant - 2 wheels;Bedside commode;Cane - single point;Tub bench;Walker - 4 wheels      Prior Function Level of Independence:  Independent with assistive device(s)  Gait / Transfers Assistance Needed: Since last admission, pt has been using RW for functional mobility. Pt endorses previous falls ADL's / Homemaking Assistance Needed: Pt reports spongebathing at baseline. Pt reports receiving assist for LB dressing     PT Goals (current goals can now be found in the care plan section) Acute Rehab PT Goals Patient Stated Goal: to get stronger PT Goal Formulation: With patient Time For Goal Achievement: 11/10/20 Potential to Achieve Goals: Good Additional Goals Additional Goal #1: Pt will be able to perform bed mobility/transfers with cga and RW in order to improve functional independence Progress towards PT goals: Progressing toward goals    Frequency    BID      PT Plan Current plan remains appropriate    Co-evaluation              AM-PAC PT "6 Clicks" Mobility   Outcome Measure  Help needed turning from your back to your side while in a flat bed without using bedrails?: A Little Help needed moving from lying on your back to sitting on the side of a flat bed without using bedrails?: A Lot Help needed moving to and from a bed to a chair (including a wheelchair)?: A Lot Help needed standing up from a chair using your arms (e.g., wheelchair or bedside chair)?: A Lot Help needed to walk in hospital room?: Total Help needed climbing 3-5 steps with a railing? : Total 6 Click Score: 11    End of Session Equipment Utilized During Treatment: Gait belt;Oxygen Activity Tolerance: Patient limited by pain Patient left: in bed;with bed alarm set Nurse Communication: Mobility status PT Visit Diagnosis: Unsteadiness on feet (R26.81);Difficulty in walking, not elsewhere classified (R26.2);Muscle weakness (generalized) (M62.81)     Time: 2094-7096 PT Time Calculation (min) (ACUTE ONLY): 24 min  Charges:  $Therapeutic Exercise: 23-37 mins                     Greggory Stallion, [place],  DPT [phone]    Daniela Siebers 10/27/2020, 4:30 PM

## 2020-10-27 NOTE — Progress Notes (Signed)
  Subjective:  Sitting in bedside chair. Patient reports pain as well controlled.  Objective:   VITALS:   Vitals:   10/26/20 2332 10/27/20 0416 10/27/20 0746 10/27/20 1221  BP: 110/64 (!) 130/98 110/90 95/64  Pulse: 70 94 89 79  Resp: 16 14 17 17   Temp: (!) 97.4 F (36.3 C) 97.7 F (36.5 C) 98 F (36.7 C) 97.7 F (36.5 C)  TempSrc: Oral Oral Oral Axillary  SpO2: 96% 100% 100% 100%  Weight:      Height:        PHYSICAL EXAM:  General: A&O x 3 LLE: dressing c/d/i, motor and sensory intact  LABS  No results found for this or any previous visit (from the past 24 hour(s)).  DG Pelvis Portable  Result Date: 10/26/2020 CLINICAL DATA:  Status post left hip surgery EXAM: PORTABLE PELVIS 1-2 VIEWS COMPARISON:  10/24/2020 FINDINGS: Left hip hemiarthroplasty is noted. No fracture or dislocation is noted. Aortic stent graft is noted as well as changes of prior vertebral augmentation. No soft tissue abnormality is noted. IMPRESSION: Status post left hip hemiarthroplasty. Electronically Signed   By: Inez Catalina M.D.   On: 10/26/2020 17:00    Assessment/Plan: POD#1 s/p L hip hemi - WBAT LLE, posterior hip precautions x 3 months - Ok to start DVT ppx - lovenox x 2 weeks - Keep aquacell in place until follow-up - F/u in clinic in 2 weeks for staple removal   Renee Harder , MD 10/27/2020, 1:27 PM

## 2020-10-27 NOTE — Progress Notes (Signed)
   10/24/20 1725  Assess: MEWS Score  Temp 97.7 F (36.5 C)  BP 137/72  Pulse Rate (!) 101  Resp (!) 24  Level of Consciousness Alert  SpO2 95 %  O2 Device Room Air  Assess: MEWS Score  MEWS Temp 0  MEWS Systolic 0  MEWS Pulse 1  MEWS RR 1  MEWS LOC 0  MEWS Score 2  MEWS Score Color Yellow  Assess: if the MEWS score is Yellow or Red  Were vital signs taken at a resting state? Yes  Focused Assessment No change from prior assessment  Early Detection of Sepsis Score *See Row Information* Low  MEWS guidelines implemented *See Row Information* Yes  Treat  MEWS Interventions Administered prn meds/treatments  Pain Scale 0-10  Pain Score 6  Pain Type Acute pain  Take Vital Signs  Increase Vital Sign Frequency  Yellow: Q 2hr X 2 then Q 4hr X 2, if remains yellow, continue Q 4hrs  Escalate  MEWS: Escalate Yellow: discuss with charge nurse/RN and consider discussing with provider and RRT  Notify: Charge Nurse/RN  Name of Charge Nurse/RN Notified Amanda, RN  Date Charge Nurse/RN Notified 10/24/20  Time Charge Nurse/RN Notified 1730  Inserted for [employer] RN

## 2020-10-27 NOTE — Care Management Important Message (Signed)
Important Message  Patient Details  Name: Roberta [name] MRN: [id number] Date of Birth: Jun 13, 1945   Medicare Important Message Given:  Yes  Patient was sleeping.  Placed a copy of the Important Message on her bedside table to review at her leisure.   Juliann Pulse A Leiann Sporer 10/27/2020, 3:11 PM

## 2020-10-27 NOTE — Progress Notes (Signed)
PROGRESS NOTE    Roberta [name]  [id number]:[id number] DOB: 03/02/1946 DOA: 10/24/2020 PCP: Gayland Curry, MD  Assessment & Plan:   Principal Problem:   Closed left subtrochanteric femur fracture ([place]) Active Problems:   COPD (chronic obstructive pulmonary disease) (HCC)   PAD (peripheral artery disease) (HCC)   GERD (gastroesophageal reflux disease)   Tobacco abuse   Chronic systolic CHF (congestive heart failure) (HCC)   Malnutrition of moderate degree   Left subcapital displaced femoral neck fracture: s/p left hemiarthroplasty on 10/26/20 as per ortho surg. 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 Appears euvolemic. Continue on home dose of metoprolol.   SVT: continue on home dose of metoprolol. Continue on tele.   COPD: w/o exacerbation. Continue on bronchodilators   Hx of AAA: s/p endovascular repair/stent in 2018.   Hx of cancer: breast cancer s/p mastectomy in 2002 & hx of small cell lung cancer sp lobectomy approx 3 years ago. Hx tobacco use disorder  Leukocytosis: likely reactive. Will continue to monitor   Hyponatremia: labile. Will continue to monitor   DVT prophylaxis: lovenox  Code Status: full  Family Communication:  Disposition Plan: waiting on PT/OT to see the pt   Level of care: Med-Surg   Status is: Inpatient  Remains inpatient appropriate because:Unsafe d/c plan, IV treatments appropriate due to intensity of illness or inability to take PO and Inpatient level of care appropriate due to severity of illness   Dispo: The patient is from: Home              Anticipated d/c is to: SNF vs [facility]              Patient currently is medically stable for d/c    Difficult to place patient : unclear    Consultants:   Ortho surg   Procedures:    Antimicrobials:    Subjective: Pt c/o malaise   Objective: Vitals:   10/26/20 1944 10/26/20 2332 10/27/20 0416 10/27/20 0746  BP: (!) 100/59 110/64 (!)  130/98 110/90  Pulse: 72 70 94 89  Resp: 16 16 14 17   Temp: 97.6 F (36.4 C) (!) 97.4 F (36.3 C) 97.7 F (36.5 C) 98 F (36.7 C)  TempSrc: Oral Oral Oral Oral  SpO2: 100% 96% 100% 100%  Weight:      Height:        Intake/Output Summary (Last 24 hours) at 10/27/2020 0754 Last data filed at 10/27/2020 0457 Gross per 24 hour  Intake 200 ml  Output 635 ml  Net -435 ml   Filed Weights   10/24/20 0454  Weight: 64.7 kg    Examination:  General exam: Appears calm and comfortable  Respiratory system: Clear to auscultation. Respiratory effort normal. Cardiovascular system: S1 & S2 +. No rubs, gallops or clicks.  Gastrointestinal system: Abdomen is nondistended, soft and nontender. Normal bowel sounds heard. Central nervous system: Alert and oriented. Moves all extremities  Psychiatry: Judgement and insight appear normal. Flat mood and affect     Data Reviewed: I have personally reviewed following labs and imaging studies  CBC: Recent Labs  Lab 10/24/20 0459 10/25/20 0505  WBC 17.6* 17.7*  NEUTROABS 14.4*  --   HGB 11.5* 10.6*  HCT 36.5 32.7*  MCV 91.5 89.8  PLT 356 725   Basic Metabolic Panel: Recent Labs  Lab 10/24/20 0459 10/25/20 0505  NA 133* 131*  K 4.1 4.0  CL 95* 96*  CO2 28  24  GLUCOSE 104* 81  BUN 18 21  CREATININE 0.95 0.78  CALCIUM 8.8* 8.4*   GFR: Estimated Creatinine Clearance: 60 mL/min (by C-G formula based on SCr of 0.78 mg/dL). Liver Function Tests: Recent Labs  Lab 10/24/20 0459  AST 30  ALT 55*  ALKPHOS 79  BILITOT 0.7  PROT 6.5  ALBUMIN 3.0*   No results for input(s): LIPASE, AMYLASE in the last 168 hours. No results for input(s): AMMONIA in the last 168 hours. Coagulation Profile: Recent Labs  Lab 10/24/20 0459  INR 0.9   Cardiac Enzymes: No results for input(s): CKTOTAL, CKMB, CKMBINDEX, TROPONINI in the last 168 hours. BNP (last 3 results) No results for input(s): PROBNP in the last 8760 hours. HbA1C: No results for  input(s): HGBA1C in the last 72 hours. CBG: No results for input(s): GLUCAP in the last 168 hours. Lipid Profile: No results for input(s): CHOL, HDL, LDLCALC, TRIG, CHOLHDL, LDLDIRECT in the last 72 hours. Thyroid Function Tests: No results for input(s): TSH, T4TOTAL, FREET4, T3FREE, THYROIDAB in the last 72 hours. Anemia Panel: No results for input(s): VITAMINB12, FOLATE, FERRITIN, TIBC, IRON, RETICCTPCT in the last 72 hours. Sepsis Labs: No results for input(s): PROCALCITON, LATICACIDVEN in the last 168 hours.  Recent Results (from the past 240 hour(s))  Resp Panel by RT-PCR (Flu A&B, Covid) Nasopharyngeal Swab     Status: None   Collection Time: 10/24/20  4:59 AM   Specimen: Nasopharyngeal Swab; Nasopharyngeal(NP) swabs in vial transport medium  Result Value Ref Range Status   SARS Coronavirus 2 by RT PCR NEGATIVE NEGATIVE Final    Comment: (NOTE) SARS-CoV-2 target nucleic acids are NOT DETECTED.  The SARS-CoV-2 RNA is generally detectable in upper respiratory specimens during the acute phase of infection. The lowest concentration of SARS-CoV-2 viral copies this assay can detect is 138 copies/mL. A negative result does not preclude SARS-Cov-2 infection and should not be used as the sole basis for treatment or other patient management decisions. A negative result may occur with  improper specimen collection/handling, submission of specimen other than nasopharyngeal swab, presence of viral mutation(s) within the areas targeted by this assay, and inadequate number of viral copies(<138 copies/mL). A negative result must be combined with clinical observations, patient history, and epidemiological information. The expected result is Negative.  Fact Sheet for Patients:  EntrepreneurPulse.com.au  Fact Sheet for Healthcare Providers:  IncredibleEmployment.be  This test is no t yet approved or cleared by the [place] FDA and  has been  authorized for detection and/or diagnosis of SARS-CoV-2 by FDA under an Emergency Use Authorization (EUA). This EUA will remain  in effect (meaning this test can be used) for the duration of the COVID-19 declaration under Section 564(b)(1) of the Act, 21 U.S.C.section 360bbb-3(b)(1), unless the authorization is terminated  or revoked sooner.       Influenza A by PCR NEGATIVE NEGATIVE Final   Influenza B by PCR NEGATIVE NEGATIVE Final    Comment: (NOTE) The Xpert Xpress SARS-CoV-2/FLU/RSV plus assay is intended as an aid in the diagnosis of influenza from Nasopharyngeal swab specimens and should not be used as a sole basis for treatment. Nasal washings and aspirates are unacceptable for Xpert Xpress SARS-CoV-2/FLU/RSV testing.  Fact Sheet for Patients: EntrepreneurPulse.com.au  Fact Sheet for Healthcare Providers: IncredibleEmployment.be  This test is not yet approved or cleared by the [place] FDA and has been authorized for detection and/or diagnosis of SARS-CoV-2 by FDA under an Emergency Use Authorization (EUA). This EUA will remain  in effect (meaning this test can be used) for the duration of the COVID-19 declaration under Section 564(b)(1) of the Act, 21 U.S.C. section 360bbb-3(b)(1), unless the authorization is terminated or revoked.  Performed at [facility], [address], [place], [place] [address]          Radiology Studies: DG Pelvis Portable  Result Date: 10/26/2020 CLINICAL DATA:  Status post left hip surgery EXAM: PORTABLE PELVIS 1-2 VIEWS COMPARISON:  10/24/2020 FINDINGS: Left hip hemiarthroplasty is noted. No fracture or dislocation is noted. Aortic stent graft is noted as well as changes of prior vertebral augmentation. No soft tissue abnormality is noted. IMPRESSION: Status post left hip hemiarthroplasty. Electronically Signed   By: Inez Catalina M.D.   On: 10/26/2020 17:00        Scheduled  Meds: . atorvastatin  10 mg Oral QHS  .  ceFAZolin (ANCEF) IV  1 g Intravenous Q8H  . Chlorhexidine Gluconate Cloth  6 each Topical Daily  . dapagliflozin propanediol  10 mg Oral Daily  . feeding supplement  237 mL Oral BID BM  . furosemide  20 mg Oral Q M,W,F  . metoprolol tartrate  25 mg Oral Q6H  . mometasone-formoterol  2 puff Inhalation BID  . multivitamin with minerals  1 tablet Oral Daily  . nicotine  14 mg Transdermal Daily  . pantoprazole  40 mg Oral Daily  . senna  1 tablet Oral BID  . vitamin B-12  1,000 mcg Oral Daily   Continuous Infusions: . methocarbamol (ROBAXIN) IV       LOS: 3 days    Time spent: 33 mins     Wyvonnia Dusky, MD [facility] Pager [phone]  If 7PM-7AM, please contact night-coverage 10/27/2020, 7:54 AM

## 2020-10-28 DIAGNOSIS — D72828 Other elevated white blood cell count: Secondary | ICD-10-CM | POA: Diagnosis not present

## 2020-10-28 DIAGNOSIS — E871 Hypo-osmolality and hyponatremia: Secondary | ICD-10-CM | POA: Diagnosis not present

## 2020-10-28 DIAGNOSIS — S7225XD Nondisplaced subtrochanteric fracture of left femur, subsequent encounter for closed fracture with routine healing: Secondary | ICD-10-CM | POA: Diagnosis not present

## 2020-10-28 LAB — CBC
HCT: 28.5 % — ABNORMAL LOW (ref 36.0–46.0)
Hemoglobin: 9.1 g/dL — ABNORMAL LOW (ref 12.0–15.0)
MCH: 29.3 pg (ref 26.0–34.0)
MCHC: 31.9 g/dL (ref 30.0–36.0)
MCV: 91.6 fL (ref 80.0–100.0)
Platelets: 238 10*3/uL (ref 150–400)
RBC: 3.11 MIL/uL — ABNORMAL LOW (ref 3.87–5.11)
RDW: 15.6 % — ABNORMAL HIGH (ref 11.5–15.5)
WBC: 12.7 10*3/uL — ABNORMAL HIGH (ref 4.0–10.5)
nRBC: 0 % (ref 0.0–0.2)

## 2020-10-28 LAB — BASIC METABOLIC PANEL
Anion gap: 10 (ref 5–15)
BUN: 37 mg/dL — ABNORMAL HIGH (ref 8–23)
CO2: 30 mmol/L (ref 22–32)
Calcium: 8.1 mg/dL — ABNORMAL LOW (ref 8.9–10.3)
Chloride: 94 mmol/L — ABNORMAL LOW (ref 98–111)
Creatinine, Ser: 1.07 mg/dL — ABNORMAL HIGH (ref 0.44–1.00)
GFR, Estimated: 55 mL/min — ABNORMAL LOW (ref >60)
Glucose, Bld: 163 mg/dL — ABNORMAL HIGH (ref 70–99)
Potassium: 3.7 mmol/L (ref 3.5–5.1)
Sodium: 134 mmol/L — ABNORMAL LOW (ref 135–145)

## 2020-10-28 MED ORDER — MELATONIN 5 MG PO TABS
5.0000 mg | ORAL_TABLET | Freq: Every day | ORAL | Status: DC
  Administered 2020-10-28 – 2020-10-29 (×2): 5 mg via ORAL
  Filled 2020-10-28 (×2): qty 1

## 2020-10-28 MED ORDER — MELATONIN 3 MG PO TABS
3.0000 mg | ORAL_TABLET | Freq: Every day | ORAL | Status: DC
  Filled 2020-10-28: qty 1

## 2020-10-28 NOTE — Progress Notes (Signed)
PROGRESS NOTE    Roberta [name]  OYD:[id number] DOB: 1945/08/31 DOA: 10/24/2020 PCP: Gayland Curry, MD  Assessment & Plan:   Principal Problem:   Closed left subtrochanteric femur fracture ([place]) Active Problems:   COPD (chronic obstructive pulmonary disease) (HCC)   PAD (peripheral artery disease) (HCC)   GERD (gastroesophageal reflux disease)   Tobacco abuse   Chronic systolic CHF (congestive heart failure) (HCC)   Malnutrition of moderate degree   Left subcapital displaced femoral neck fracture: s/p left hemiarthroplasty on 10/26/20 as per ortho surg. Norco, morphine prn for pain. PT/OT recs SNF   Chronic combined CHF: w/ hx of cardiomyopathy, unknown type. Appears euvolemic. Continue on home dose of metoprolol.   SVT: continue on home dose of BB. Continue on tele   COPD: w/o exacerbation. Continue on bronchodilators and encourage incentive spirometry   Hx of AAA: s/p endovascular repair/stent in 2018.   Hx of cancer: breast cancer s/p mastectomy in 2002 & hx of small cell lung cancer sp lobectomy approx 3 years ago. Hx tobacco use disorder  Leukocytosis: likely reactive and trending down.   Hyponatremia: almost WNL. Will continue to monitor   DVT prophylaxis: lovenox  Code Status: full  Family Communication: discussed pt's care w/ pt's family at bedside and answered their questions Disposition Plan: likely d/c to SNF   Level of care: Med-Surg   Status is: Inpatient  Remains inpatient appropriate because:Unsafe d/c plan, IV treatments appropriate due to intensity of illness or inability to take PO and Inpatient level of care appropriate due to severity of illness, waiting on SNF placement    Dispo: The patient is from: Home              Anticipated d/c is to: SNF              Patient currently is medically stable for d/c    Difficult to place patient : unclear    Consultants:   Ortho surg   Procedures:    Antimicrobials:    Subjective: Pt c/o  left hip pain    Objective: Vitals:   10/27/20 1553 10/27/20 2030 10/28/20 0014 10/28/20 0525  BP: 134/85 (!) 108/58 110/69 128/66  Pulse: 94 74 78 92  Resp: 16 18 16 18   Temp: 98 F (36.7 C) (!) 97.5 F (36.4 C) 98.1 F (36.7 C) (!) 97.5 F (36.4 C)  TempSrc: Oral Oral  Oral  SpO2: 100% 100% 98% 98%  Weight:      Height:        Intake/Output Summary (Last 24 hours) at 10/28/2020 0725 Last data filed at 10/28/2020 0532 Gross per 24 hour  Intake 480 ml  Output 1275 ml  Net -795 ml   Filed Weights   10/24/20 0454  Weight: 64.7 kg    Examination:  General exam: Appears uncomfortable  Respiratory system: diminished breath sounds b/l otherwise clear. Cardiovascular system: S1/S2+. No rubs or gallops Gastrointestinal system: Abd is soft, NT, ND & hypoactive bowel sounds  Central nervous system: Alert and oriented. Moves all extremities  Psychiatry: judgement and insight appear normal. Flat mood and affect    Data Reviewed: I have personally reviewed following labs and imaging studies  CBC: Recent Labs  Lab 10/24/20 0459 10/25/20 0505 10/28/20 0227  WBC 17.6* 17.7* 12.7*  NEUTROABS 14.4*  --   --   HGB 11.5* 10.6* 9.1*  HCT 36.5 32.7* 28.5*  MCV 91.5 89.8 91.6  PLT 356 306 672   Basic Metabolic Panel:  Recent Labs  Lab 10/24/20 0459 10/25/20 0505 10/28/20 0227  NA 133* 131* 134*  K 4.1 4.0 3.7  CL 95* 96* 94*  CO2 28 24 30   GLUCOSE 104* 81 163*  BUN 18 21 37*  CREATININE 0.95 0.78 1.07*  CALCIUM 8.8* 8.4* 8.1*   GFR: Estimated Creatinine Clearance: 44.9 mL/min (A) (by C-G formula based on SCr of 1.07 mg/dL (H)). Liver Function Tests: Recent Labs  Lab 10/24/20 0459  AST 30  ALT 55*  ALKPHOS 79  BILITOT 0.7  PROT 6.5  ALBUMIN 3.0*   No results for input(s): LIPASE, AMYLASE in the last 168 hours. No results for input(s): AMMONIA in the last 168 hours. Coagulation Profile: Recent Labs  Lab 10/24/20 0459  INR 0.9   Cardiac Enzymes: No  results for input(s): CKTOTAL, CKMB, CKMBINDEX, TROPONINI in the last 168 hours. BNP (last 3 results) No results for input(s): PROBNP in the last 8760 hours. HbA1C: No results for input(s): HGBA1C in the last 72 hours. CBG: No results for input(s): GLUCAP in the last 168 hours. Lipid Profile: No results for input(s): CHOL, HDL, LDLCALC, TRIG, CHOLHDL, LDLDIRECT in the last 72 hours. Thyroid Function Tests: No results for input(s): TSH, T4TOTAL, FREET4, T3FREE, THYROIDAB in the last 72 hours. Anemia Panel: No results for input(s): VITAMINB12, FOLATE, FERRITIN, TIBC, IRON, RETICCTPCT in the last 72 hours. Sepsis Labs: No results for input(s): PROCALCITON, LATICACIDVEN in the last 168 hours.  Recent Results (from the past 240 hour(s))  Resp Panel by RT-PCR (Flu A&B, Covid) Nasopharyngeal Swab     Status: None   Collection Time: 10/24/20  4:59 AM   Specimen: Nasopharyngeal Swab; Nasopharyngeal(NP) swabs in vial transport medium  Result Value Ref Range Status   SARS Coronavirus 2 by RT PCR NEGATIVE NEGATIVE Final    Comment: (NOTE) SARS-CoV-2 target nucleic acids are NOT DETECTED.  The SARS-CoV-2 RNA is generally detectable in upper respiratory specimens during the acute phase of infection. The lowest concentration of SARS-CoV-2 viral copies this assay can detect is 138 copies/mL. A negative result does not preclude SARS-Cov-2 infection and should not be used as the sole basis for treatment or other patient management decisions. A negative result may occur with  improper specimen collection/handling, submission of specimen other than nasopharyngeal swab, presence of viral mutation(s) within the areas targeted by this assay, and inadequate number of viral copies(<138 copies/mL). A negative result must be combined with clinical observations, patient history, and epidemiological information. The expected result is Negative.  Fact Sheet for Patients:   EntrepreneurPulse.com.au  Fact Sheet for Healthcare Providers:  IncredibleEmployment.be  This test is no t yet approved or cleared by the [place] FDA and  has been authorized for detection and/or diagnosis of SARS-CoV-2 by FDA under an Emergency Use Authorization (EUA). This EUA will remain  in effect (meaning this test can be used) for the duration of the COVID-19 declaration under Section 564(b)(1) of the Act, 21 U.S.C.section 360bbb-3(b)(1), unless the authorization is terminated  or revoked sooner.       Influenza A by PCR NEGATIVE NEGATIVE Final   Influenza B by PCR NEGATIVE NEGATIVE Final    Comment: (NOTE) The Xpert Xpress SARS-CoV-2/FLU/RSV plus assay is intended as an aid in the diagnosis of influenza from Nasopharyngeal swab specimens and should not be used as a sole basis for treatment. Nasal washings and aspirates are unacceptable for Xpert Xpress SARS-CoV-2/FLU/RSV testing.  Fact Sheet for Patients: EntrepreneurPulse.com.au  Fact Sheet for Healthcare Providers: IncredibleEmployment.be  This test is not yet approved or cleared by the [place] and has been authorized for detection and/or diagnosis of SARS-CoV-2 by FDA under an Emergency Use Authorization (EUA). This EUA will remain in effect (meaning this test can be used) for the duration of the COVID-19 declaration under Section 564(b)(1) of the Act, 21 U.S.C. section 360bbb-3(b)(1), unless the authorization is terminated or revoked.  Performed at [facility], [address], [place], [place] [address]          Radiology Studies: DG Pelvis Portable  Result Date: 10/26/2020 CLINICAL DATA:  Status post left hip surgery EXAM: PORTABLE PELVIS 1-2 VIEWS COMPARISON:  10/24/2020 FINDINGS: Left hip hemiarthroplasty is noted. No fracture or dislocation is noted. Aortic stent graft is noted as well as changes of prior  vertebral augmentation. No soft tissue abnormality is noted. IMPRESSION: Status post left hip hemiarthroplasty. Electronically Signed   By: Inez Catalina M.D.   On: 10/26/2020 17:00        Scheduled Meds: . atorvastatin  10 mg Oral QHS  . Chlorhexidine Gluconate Cloth  6 each Topical Daily  . dapagliflozin propanediol  10 mg Oral Daily  . enoxaparin (LOVENOX) injection  40 mg Subcutaneous Q24H  . feeding supplement  237 mL Oral BID BM  . furosemide  20 mg Oral Q M,W,F  . metoprolol tartrate  25 mg Oral Q6H  . mometasone-formoterol  2 puff Inhalation BID  . multivitamin with minerals  1 tablet Oral Daily  . nicotine  14 mg Transdermal Daily  . pantoprazole  40 mg Oral Daily  . senna  1 tablet Oral BID  . vitamin B-12  1,000 mcg Oral Daily   Continuous Infusions: . methocarbamol (ROBAXIN) IV       LOS: 4 days    Time spent: 35 mins     Wyvonnia Dusky, MD [facility] Pager [phone]  If 7PM-7AM, please contact night-coverage 10/28/2020, 7:25 AM

## 2020-10-28 NOTE — Progress Notes (Signed)
Physical Therapy Treatment Patient Details Name: Roberta [name] MRN: [id number] DOB: [birth year] Today's Date: 10/28/2020    History of Present Illness Pt admitted for L subtrochentric femur fx secondary to fall and is now s/p L hip hemi. History includes CHF, small lung cancer s/p lobectomy, breast cancer, COPD not on O2 at home.    PT Comments    Pt's afternoon session focused on returning to bed after sitting up for long period of time.  Pt c/o fatigue and 5/10 left hip pain.  ModA to raise from chair, MinA for stand pivot towards involved side.  Pt practiced a few more sit to stand transfers from bed to RW with Min/ModA.  ModA for LE's sit to supine, ROM to tolerance in supine, pt properly positioned to prevent undesired movement/resting position.  Continue PT per POC, pt will benefit from SNF placement once medically stable.    Follow Up Recommendations  SNF     Equipment Recommendations  None recommended by PT    Recommendations for Other Services       Precautions / Restrictions Precautions Precautions: Posterior Hip;Fall Precaution Booklet Issued: Yes (comment) Precaution Comments: post hip Restrictions Weight Bearing Restrictions: Yes LLE Weight Bearing: Weight bearing as tolerated    Mobility  Bed Mobility Overal bed mobility: Needs Assistance Bed Mobility: Supine to Sit;Sit to Supine     Supine to sit: Mod assist Sit to supine: Mod assist   General bed mobility comments: assist for B LE management. Needs more extensive assist for trunk mobility this date and reports pain. All mobility performed on RA with sats at 91%.    Transfers Overall transfer level: Needs assistance Equipment used: Rolling walker (2 wheeled) Transfers: Sit to/from Stand Sit to Stand: Mod assist Stand pivot transfers: Min assist       General transfer comment: Static standing at RW x 10-20 second increments  Ambulation/Gait Ambulation/Gait assistance: Mod assist Gait Distance  (Feet): 2 Feet Assistive device: Rolling walker (2 wheeled) Gait Pattern/deviations: Step-to pattern     General Gait Details: able to take a few steps to recliner. Unsafe and unsteady. Able to use RW with heavy cues for sequencing. Fatigues quickly   Marine scientist [place] (Stroke Patients Only)       Balance Overall balance assessment: Needs assistance Sitting-balance support: No upper extremity supported;Feet supported Sitting balance-Leahy Scale: Good     Standing balance support: Bilateral upper extremity supported;During functional activity Standing balance-Leahy Scale: Fair                              Cognition Arousal/Alertness: Awake/alert Behavior During Therapy: WFL for tasks assessed/performed Overall Cognitive Status: Within Functional Limits for tasks assessed                                        Exercises General Exercises - Lower Extremity Ankle Circles/Pumps: AROM;Both;10 reps;Supine Other Exercises Other Exercises: AAROM L heelslides x 5 Other Exercises: reviewed hip precautions. Able to recite 2/3 with cues    General Comments General comments (skin integrity, edema, etc.): Pt fatigued after sitting up in chair for a while. Assisted back to bed with folded pillows in between legs due to pt's tendency to IR and ADD  Pertinent Vitals/Pain Pain Assessment: 0-10 Pain Score: 5  Pain Location: L LE Pain Descriptors / Indicators: Aching;Grimacing;Discomfort Pain Intervention(s): Monitored during session;Patient requesting pain meds-RN notified;Limited activity within patient's tolerance    Home Living                      Prior Function            PT Goals (current goals can now be found in the care plan section) Acute Rehab PT Goals Patient Stated Goal: to get stronger PT Goal Formulation: With patient Time For Goal Achievement: 11/10/20 Potential to Achieve  Goals: Good Progress towards PT goals: Progressing toward goals    Frequency    BID      PT Plan Current plan remains appropriate    Co-evaluation              AM-PAC PT "6 Clicks" Mobility   Outcome Measure  Help needed turning from your back to your side while in a flat bed without using bedrails?: A Little Help needed moving from lying on your back to sitting on the side of a flat bed without using bedrails?: A Lot Help needed moving to and from a bed to a chair (including a wheelchair)?: A Lot Help needed standing up from a chair using your arms (e.g., wheelchair or bedside chair)?: A Lot Help needed to walk in hospital room?: Total Help needed climbing 3-5 steps with a railing? : Total 6 Click Score: 11    End of Session Equipment Utilized During Treatment: Gait belt Activity Tolerance: Patient limited by pain;Patient limited by fatigue Patient left: in bed;with bed alarm set;with call bell/phone within reach Nurse Communication: Mobility status PT Visit Diagnosis: Unsteadiness on feet (R26.81);Difficulty in walking, not elsewhere classified (R26.2);Muscle weakness (generalized) (M62.81)     Time: 1448-1856 PT Time Calculation (min) (ACUTE ONLY): 25 min  Charges:  $Gait Training: 8-22 mins $Therapeutic Exercise: 8-22 mins $Therapeutic Activity: 23-37 mins                     Mikel Cella, PTA   [name] 10/28/2020, 1:24 PM

## 2020-10-28 NOTE — TOC Progression Note (Signed)
Transition of Care [facility]) - Progression Note    Patient Details  Name: Roberta [name] MRN: [id number] Date of Birth: November 02, 1945  Transition of Care [facility]) CM/SW [place], RN Phone Number: 10/28/2020, 9:28 AM  Clinical Narrative:  Spoke to daughter re: SNF, all agree for patient to go to SNF for rehab.  Bed search submitted.     Expected Discharge Plan:  (TBD) Barriers to Discharge: Continued Medical Work up  Expected Discharge Plan and Services Expected Discharge Plan:  (TBD)   Discharge Planning Services: CM Consult   Living arrangements for the past 2 months: Single Family Home                 DME Arranged: N/A                     Social Determinants of Health (SDOH) Interventions    Readmission Risk Interventions Readmission Risk Prevention Plan 10/25/2020  Transportation Screening Complete  PCP or Specialist Appt within 5-7 Days Complete  Home Care Screening Complete  Medication Review (RN CM) Complete  Some recent data might be hidden

## 2020-10-28 NOTE — NC FL2 (Signed)
[place] LEVEL OF CARE SCREENING TOOL     IDENTIFICATION  Patient Name: Roberta [name] Birthdate: 1945-09-22 Sex: female Admission Date (Current Location): 10/24/2020  [place] and [place] Number:  Engineering geologist and Address:  [facility], [address], [place], [place] [address]      Provider Number: [id number]  Attending Physician Name and Address:  Wyvonnia Dusky, MD  Relative Name and Phone Number:  Lesli Albee (Daughter)   [phone] [facility])    Current Level of Care: Hospital Recommended Level of Care:   Prior Approval Number:    Date Approved/Denied:   PASRR Number: [id number]  Discharge Plan: SNF    Current Diagnoses: Patient Active Problem List   Diagnosis Date Noted  . Malnutrition of moderate degree 10/25/2020  . Closed left subtrochanteric femur fracture ([place]) 10/24/2020  . Chronic systolic CHF (congestive heart failure) ([place])   . COPD with acute exacerbation ([place]) 10/12/2020  . Acute metabolic encephalopathy 56/31/4970  . Elevated troponin 09/07/2020  . AKI (acute kidney injury) ([place]) 09/07/2020  . GERD (gastroesophageal reflux disease) 09/07/2020  . Tobacco abuse 09/07/2020  . Malignant neoplasm of unspecified part of unspecified bronchus or lung ([place]) 02/22/2020  . Cough 02/12/2020  . Rib pain on left side 11/29/2019  . Compression fracture of T6 vertebra ([place]) 11/05/2019  . Weight loss 07/29/2019  . Age-related osteoporosis without current pathological fracture 05/21/2019  . Compression fracture of T5 vertebra ([place]) 05/06/2019  . Port-A-Cath in place 05/06/2019  . Closed stable burst fracture of fourth lumbar vertebra ([place]) 06/08/2018  . Severe protein-calorie malnutrition ([place]) 10/29/2017  . Sepsis ([place]) 04/28/2017  . Small cell lung cancer, right upper lobe ([place]) 02/23/2017  . Goals of care, counseling/discussion 02/23/2017  . Lung mass   . AAA (abdominal aortic aneurysm) without  rupture ([place]) 11/02/2016  . COPD (chronic obstructive pulmonary disease) ([place]) 11/02/2016  . Hyperlipidemia 11/02/2016  . PAD (peripheral artery disease) ([place]) 11/02/2016    Orientation RESPIRATION BLADDER Height & Weight     Self,Time,Situation,Place  Normal Continent,External catheter Weight: 64.7 kg Height:  5\' 7"  (170.2 cm)  BEHAVIORAL SYMPTOMS/MOOD NEUROLOGICAL BOWEL NUTRITION STATUS      Continent Diet (Heart Healthy)  AMBULATORY STATUS COMMUNICATION OF NEEDS Skin   Limited Assist   Surgical wounds (L hip [place] Dressings)                       Personal Care Assistance Level of Assistance  Bathing,Feeding,Dressing Bathing Assistance: Limited assistance Feeding assistance: Limited assistance Dressing Assistance: Limited assistance     Functional Limitations Info  Sight,Hearing,Speech Sight Info: Impaired Hearing Info: Impaired Speech Info: Impaired    SPECIAL CARE FACTORS FREQUENCY  PT (By licensed PT),OT (By licensed OT)     PT Frequency: min 5x weekly OT Frequency: min 5x weekly            Contractures Contractures Info: Not present    Additional Factors Info  Code Status,Allergies Code Status Info: DNR Allergies Info: No Known Allergies           Current Medications (10/28/2020):  This is the current hospital active medication list Current Facility-Administered Medications  Medication Dose Route Frequency Provider Last Rate Last Admin  . acetaminophen (TYLENOL) tablet 650 mg  650 mg Oral Q6H PRN Renee Harder, MD   650 mg at 10/26/20 2048  . albuterol (PROVENTIL) (2.5 MG/3ML) 0.083% nebulizer solution 2.5 mg  2.5 mg Nebulization Q6H PRN Renee Harder,  MD      . atorvastatin (LIPITOR) tablet 10 mg  10 mg Oral Dewain Penning, MD   10 mg at 10/27/20 2145  . Chlorhexidine Gluconate Cloth 2 % PADS 6 each  6 each Topical Daily Renee Harder, MD   6 each at 10/26/20 [phone]  . dapagliflozin propanediol (FARXIGA) tablet 10 mg  10 mg Oral  Daily Renee Harder, MD   10 mg at 10/28/20 0810  . enoxaparin (LOVENOX) injection 40 mg  40 mg Subcutaneous Q24H Wyvonnia Dusky, MD   40 mg at 10/27/20 1712  . feeding supplement (ENSURE ENLIVE / ENSURE PLUS) liquid 237 mL  237 mL Oral BID BM Renee Harder, MD   237 mL at 10/27/20 1208  . furosemide (LASIX) tablet 20 mg  20 mg Oral Q M,W,F Renee Harder, MD   20 mg at 10/27/20 0802  . HYDROcodone-acetaminophen (NORCO/VICODIN) 5-325 MG per tablet 1-2 tablet  1-2 tablet Oral Q6H PRN Renee Harder, MD   1 tablet at 10/28/20 0913  . melatonin tablet 3 mg  3 mg Oral QHS Wyvonnia Dusky, MD      . methocarbamol (ROBAXIN) tablet 500 mg  500 mg Oral Q6H PRN Renee Harder, MD   500 mg at 10/24/20 1703   Or  . methocarbamol (ROBAXIN) 500 mg in dextrose 5 % 50 mL IVPB  500 mg Intravenous Q6H PRN Renee Harder, MD      . metoprolol tartrate (LOPRESSOR) tablet 25 mg  25 mg Oral Q6H Renee Harder, MD   25 mg at 10/28/20 9509  . mometasone-formoterol (DULERA) 200-5 MCG/ACT inhaler 2 puff  2 puff Inhalation BID Renee Harder, MD   2 puff at 10/28/20 0810  . morphine 2 MG/ML injection 0.5 mg  0.5 mg Intravenous Q2H PRN Renee Harder, MD   0.5 mg at 10/24/20 1702  . multivitamin with minerals tablet 1 tablet  1 tablet Oral Daily Renee Harder, MD   1 tablet at 10/28/20 [phone]  . nicotine (NICODERM CQ - dosed in mg/24 hours) patch 14 mg  14 mg Transdermal Daily Renee Harder, MD   14 mg at 10/28/20 0809  . pantoprazole (PROTONIX) EC tablet 40 mg  40 mg Oral Daily Renee Harder, MD   40 mg at 10/28/20 1245  . senna (SENOKOT) tablet 8.6 mg  1 tablet Oral BID Renee Harder, MD   8.6 mg at 10/28/20 0807  . vitamin B-12 (CYANOCOBALAMIN) tablet 1,000 mcg  1,000 mcg Oral Daily Renee Harder, MD   1,000 mcg at 10/28/20 8099  . zolpidem (AMBIEN) tablet 5 mg  5 mg Oral QHS PRN Renee Harder, MD   5 mg at 10/27/20 2145   Facility-Administered Medications  Ordered in Other Encounters  Medication Dose Route Frequency Provider Last Rate Last Admin  . sodium chloride flush (NS) 0.9 % injection 10 mL  10 mL Intravenous PRN Sindy Guadeloupe, MD   10 mL at 03/26/17 0948  . sodium chloride flush (NS) 0.9 % injection 10 mL  10 mL Intravenous PRN Sindy Guadeloupe, MD   10 mL at 04/16/17 1006  . sodium chloride flush (NS) 0.9 % injection 10 mL  10 mL Intravenous PRN Sindy Guadeloupe, MD   10 mL at 01/10/18 1410  . sodium chloride flush (NS) 0.9 % injection 10 mL  10 mL Intravenous PRN Lequita Asal, MD   10 mL at 12/11/18 1033     Discharge Medications: Please see discharge summary for a list  of discharge medications.  Relevant Imaging Results:  Relevant Lab Results:   Additional Information [place] Astryd Pearcy, RN

## 2020-10-28 NOTE — Progress Notes (Signed)
Physical Therapy Treatment Patient Details Name: Roberta [name] MRN: [id number] DOB: June 30, 1945 Today's Date: 10/28/2020    History of Present Illness Pt admitted for L subtrochentric femur fx secondary to fall and is now s/p L hip hemi. History includes CHF, small lung cancer s/p lobectomy, breast cancer, COPD not on O2 at home.    PT Comments    Pt is making good progress towards goals with ability to take a few steps using RW over to recliner. Bed saturated in urine, CNA in to perform linen change. Pt still needs cues for hip precautions. Good endurance with there-ex, however experiencing more pain this date. Continues to be motivated to improve functional status. Will continue to progress.   Follow Up Recommendations  SNF     Equipment Recommendations  None recommended by PT    Recommendations for Other Services       Precautions / Restrictions Precautions Precautions: Posterior Hip;Fall Precaution Booklet Issued: Yes (comment) Precaution Comments: post hip Restrictions Weight Bearing Restrictions: Yes LLE Weight Bearing: Weight bearing as tolerated    Mobility  Bed Mobility Overal bed mobility: Needs Assistance Bed Mobility: Supine to Sit;Sit to Supine     Supine to sit: Mod assist     General bed mobility comments: assist for B LE management. Needs more extensive assist for trunk mobility this date and reports pain. All mobility performed on RA with sats at 91%.    Transfers Overall transfer level: Needs assistance Equipment used: Rolling walker (2 wheeled) Transfers: Sit to/from Stand Sit to Stand: Mod assist         General transfer comment: able to transfer multiple times with each time able to stand from longer period, up to 1 min on last transfer. Needs cues for upright posture and correct hand placement. Pt fearful of falling  Ambulation/Gait Ambulation/Gait assistance: Mod assist Gait Distance (Feet): 2 Feet Assistive device: Rolling walker (2  wheeled) Gait Pattern/deviations: Step-to pattern     General Gait Details: able to take a few steps to recliner. Unsafe and unsteady. Able to use RW with heavy cues for sequencing. Fatigues quickly   Marine scientist Rankin (Stroke Patients Only)       Balance Overall balance assessment: Needs assistance Sitting-balance support: No upper extremity supported;Feet supported Sitting balance-Leahy Scale: Good     Standing balance support: Bilateral upper extremity supported;During functional activity Standing balance-Leahy Scale: Fair                              Cognition Arousal/Alertness: Awake/alert Behavior During Therapy: WFL for tasks assessed/performed Overall Cognitive Status: Within Functional Limits for tasks assessed                                        Exercises Other Exercises Other Exercises: supine ther-ex performed on L LE including AP, glut sets, SAQ, and hip abd/add. 12 reps with mod assist. Other Exercises: reviewed hip precautions. Able to recite 2/3 with cues    General Comments        Pertinent Vitals/Pain Pain Assessment: 0-10 Pain Score: 3  Pain Location: L LE Pain Descriptors / Indicators: Grimacing;Discomfort Pain Intervention(s): Limited activity within patient's tolerance    Home Living  Prior Function            PT Goals (current goals can now be found in the care plan section) Acute Rehab PT Goals Patient Stated Goal: to get stronger PT Goal Formulation: With patient Time For Goal Achievement: 11/10/20 Potential to Achieve Goals: Good Progress towards PT goals: Progressing toward goals    Frequency    BID      PT Plan Current plan remains appropriate    Co-evaluation              AM-PAC PT "6 Clicks" Mobility   Outcome Measure  Help needed turning from your back to your side while in a flat bed without using  bedrails?: A Little Help needed moving from lying on your back to sitting on the side of a flat bed without using bedrails?: A Lot Help needed moving to and from a bed to a chair (including a wheelchair)?: A Lot Help needed standing up from a chair using your arms (e.g., wheelchair or bedside chair)?: A Lot Help needed to walk in hospital room?: Total Help needed climbing 3-5 steps with a railing? : Total 6 Click Score: 11    End of Session Equipment Utilized During Treatment: Gait belt;Oxygen Activity Tolerance: Patient limited by pain Patient left: in chair;with chair alarm set Nurse Communication: Mobility status PT Visit Diagnosis: Unsteadiness on feet (R26.81);Difficulty in walking, not elsewhere classified (R26.2);Muscle weakness (generalized) (M62.81)     Time: 4627-0350 PT Time Calculation (min) (ACUTE ONLY): 33 min  Charges:  $Gait Training: 8-22 mins $Therapeutic Exercise: 8-22 mins                     Greggory Stallion, PT, DPT [phone]    Jawanda Passey 10/28/2020, 1:15 PM

## 2020-10-28 NOTE — TOC Progression Note (Signed)
Transition of Care [facility]) - Progression Note    Patient Details  Name: Roberta [name] MRN: [id number] Date of Birth: [birth year]  Transition of Care [facility]) CM/SW [place], RN Phone Number: 10/28/2020, 4:22 PM  Clinical Narrative:   LM for daughter with bed offers.  Awaiting response.    Expected Discharge Plan:  (TBD) Barriers to Discharge: Continued Medical Work up  Expected Discharge Plan and Services Expected Discharge Plan:  (TBD)   Discharge Planning Services: CM Consult   Living arrangements for the past 2 months: Single Family Home                 DME Arranged: N/A                     Social Determinants of Health (SDOH) Interventions    Readmission Risk Interventions Readmission Risk Prevention Plan 10/25/2020  Transportation Screening Complete  PCP or Specialist Appt within 5-7 Days Complete  Home Care Screening Complete  Medication Review (RN CM) Complete  Some recent data might be hidden

## 2020-10-29 DIAGNOSIS — S7225XD Nondisplaced subtrochanteric fracture of left femur, subsequent encounter for closed fracture with routine healing: Secondary | ICD-10-CM | POA: Diagnosis not present

## 2020-10-29 DIAGNOSIS — E871 Hypo-osmolality and hyponatremia: Secondary | ICD-10-CM | POA: Diagnosis not present

## 2020-10-29 DIAGNOSIS — I5022 Chronic systolic (congestive) heart failure: Secondary | ICD-10-CM | POA: Diagnosis not present

## 2020-10-29 LAB — BASIC METABOLIC PANEL
Anion gap: 7 (ref 5–15)
BUN: 27 mg/dL — ABNORMAL HIGH (ref 8–23)
CO2: 30 mmol/L (ref 22–32)
Calcium: 8.4 mg/dL — ABNORMAL LOW (ref 8.9–10.3)
Chloride: 95 mmol/L — ABNORMAL LOW (ref 98–111)
Creatinine, Ser: 0.89 mg/dL (ref 0.44–1.00)
GFR, Estimated: >60 mL/min (ref >60)
Glucose, Bld: 116 mg/dL — ABNORMAL HIGH (ref 70–99)
Potassium: 4.8 mmol/L (ref 3.5–5.1)
Sodium: 132 mmol/L — ABNORMAL LOW (ref 135–145)

## 2020-10-29 LAB — CBC
HCT: 28.7 % — ABNORMAL LOW (ref 36.0–46.0)
Hemoglobin: 9.3 g/dL — ABNORMAL LOW (ref 12.0–15.0)
MCH: 29.7 pg (ref 26.0–34.0)
MCHC: 32.4 g/dL (ref 30.0–36.0)
MCV: 91.7 fL (ref 80.0–100.0)
Platelets: 277 10*3/uL (ref 150–400)
RBC: 3.13 MIL/uL — ABNORMAL LOW (ref 3.87–5.11)
RDW: 15.8 % — ABNORMAL HIGH (ref 11.5–15.5)
WBC: 12.4 10*3/uL — ABNORMAL HIGH (ref 4.0–10.5)
nRBC: 0 % (ref 0.0–0.2)

## 2020-10-29 LAB — SURGICAL PATHOLOGY

## 2020-10-29 LAB — SARS CORONAVIRUS 2 (TAT 6-24 HRS): SARS Coronavirus 2: NEGATIVE

## 2020-10-29 MED ORDER — ENOXAPARIN SODIUM 40 MG/0.4ML IJ SOSY: 40.0000 mg | PREFILLED_SYRINGE | INTRAMUSCULAR | 0 refills | Status: DC

## 2020-10-29 MED ORDER — HYDROCODONE-ACETAMINOPHEN 5-325 MG PO TABS: 1.0000 | ORAL_TABLET | Freq: Four times a day (QID) | ORAL | 0 refills | Status: AC | PRN

## 2020-10-29 NOTE — TOC Progression Note (Signed)
Transition of Care [facility]) - Progression Note    Patient Details  Name: TAHJAE DURR MRN: [id number] Date of Birth: [birth year]  Transition of Care [facility]) CM/SW [place], RN Phone Number: 10/29/2020, 4:05 PM  Clinical Narrative:     Called the lab to check on the covid test, it was sent out and is a 4-6 hour test, it has been gone almost 4 hours and should result soon, Nurse will need to call EMS to reschedule once results are in Expected Discharge Plan:  (TBD) Barriers to Discharge: Continued Medical Work up  Expected Discharge Plan and Services Expected Discharge Plan:  (TBD)   Discharge Planning Services: CM Consult   Living arrangements for the past 2 months: Single Family Home Expected Discharge Date: 10/29/20               DME Arranged: N/A                     Social Determinants of Health (SDOH) Interventions    Readmission Risk Interventions Readmission Risk Prevention Plan 10/25/2020  Transportation Screening Complete  PCP or Specialist Appt within 5-7 Days Complete  Home Care Screening Complete  Medication Review (RN CM) Complete  Some recent data might be hidden

## 2020-10-29 NOTE — Progress Notes (Signed)
Physical Therapy Treatment Patient Details Name: Roberta [name] MRN: [id number] DOB: [birth year] Today's Date: 10/29/2020    History of Present Illness Pt admitted for L subtrochentric femur fx secondary to fall and is now s/p L hip hemi. History includes CHF, small lung cancer s/p lobectomy, breast cancer, COPD not on O2 at home.    PT Comments    Pt ready to return to bed after almost 3 hours.  +2 assist to transfer back to bed safely as she continues with generally unsafe technique and sitting before fully turned.  Positioned for comfort and needs met.   Follow Up Recommendations  SNF     Equipment Recommendations  None recommended by PT    Recommendations for Other Services       Precautions / Restrictions Precautions Precautions: Posterior Hip;Fall Precaution Booklet Issued: Yes (comment) Precaution Comments: post hip Restrictions Weight Bearing Restrictions: Yes LLE Weight Bearing: Weight bearing as tolerated    Mobility  Bed Mobility Overal bed mobility: Needs Assistance Bed Mobility: Sit to Supine       Sit to supine: Mod assist   General bed mobility comments: up with nursing just before session    Transfers Overall transfer level: Needs assistance Equipment used: Rolling walker (2 wheeled) Transfers: Sit to/from Stand Sit to Stand: Mod assist;+2 physical assistance            Ambulation/Gait Ambulation/Gait assistance: Mod assist;+2 physical assistance Gait Distance (Feet): 2 Feet Assistive device: Rolling walker (2 wheeled) Gait Pattern/deviations: Step-to pattern Gait velocity: dec   General Gait Details: takes only a few small steps before turning to sit prematurely despite cues   Stairs             Wheelchair Mobility    Modified Rankin (Stroke Patients Only)       Balance Overall balance assessment: Needs assistance Sitting-balance support: No upper extremity supported;Feet supported Sitting balance-Leahy Scale: Good      Standing balance support: Bilateral upper extremity supported;During functional activity Standing balance-Leahy Scale: Poor Standing balance comment: Pt with forward flexed posture, requiring MOD A to maintain standing                            Cognition Arousal/Alertness: Awake/alert Behavior During Therapy: WFL for tasks assessed/performed Overall Cognitive Status: Within Functional Limits for tasks assessed                                        Exercises Other Exercises Other Exercises: supine BLE ex x 10 Other Exercises: assisted with bathing on commode - pt given time to particiapte in care as able    General Comments        Pertinent Vitals/Pain Pain Assessment: Faces Faces Pain Scale: Hurts even more Pain Location: L LE Pain Descriptors / Indicators: Aching;Grimacing;Discomfort Pain Intervention(s): Limited activity within patient's tolerance;Premedicated before session;Repositioned;Monitored during session    Home Living                      Prior Function            PT Goals (current goals can now be found in the care plan section) Acute Rehab PT Goals Patient Stated Goal: to get stronger PT Goal Formulation: With patient Time For Goal Achievement: 11/10/20 Potential to Achieve Goals: Good Progress towards PT goals: Progressing toward goals  Frequency    BID      PT Plan Current plan remains appropriate    Co-evaluation              AM-PAC PT "6 Clicks" Mobility   Outcome Measure  Help needed turning from your back to your side while in a flat bed without using bedrails?: A Little Help needed moving from lying on your back to sitting on the side of a flat bed without using bedrails?: A Lot Help needed moving to and from a bed to a chair (including a wheelchair)?: A Lot Help needed standing up from a chair using your arms (e.g., wheelchair or bedside chair)?: A Lot Help needed to walk in hospital  room?: Total Help needed climbing 3-5 steps with a railing? : Total 6 Click Score: 11    End of Session Equipment Utilized During Treatment: Gait belt Activity Tolerance: Patient limited by pain;Patient limited by fatigue Patient left: in chair;with call bell/phone within reach;with chair alarm set Nurse Communication: Mobility status PT Visit Diagnosis: Unsteadiness on feet (R26.81);Difficulty in walking, not elsewhere classified (R26.2);Muscle weakness (generalized) (M62.81)     Time: 6440-3474 PT Time Calculation (min) (ACUTE ONLY): 8 min  Charges:  $Therapeutic Exercise: 8-22 mins $Therapeutic Activity: 8-22 mins                    Chesley Noon, PTA 10/29/20, 1:47 PM

## 2020-10-29 NOTE — Discharge Summary (Addendum)
Physician Discharge Summary  Roberta [name] YPP:[id number] DOB: Jan 14, 1946 DOA: 10/24/2020  PCP: Gayland Curry, MD  Admit date: 10/24/2020 Discharge date: 10/31/20  Admitted From: home  Disposition:  SNF  Recommendations for Outpatient Follow-up:  1. Follow up with PCP in 1-2 weeks 2. F/u w/ ortho surg., Dr. Sharlet Salina, in 2 weeks  Home Health: no  Equipment/Devices:   Discharge Condition: stable  CODE STATUS: full  Diet recommendation: Heart Healthy / Carb Modified   Brief/Interim Summary: HPI was taken from Dr. Francine Graven: Roberta [name] is a 75 y.o. 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  She was discharged from the hospital about a week ago after hospitalization for new onset systolic congestive heart failure. Patient states that she got up to use the bedside commode and her leg gave way and she fell.  She was unable to get up and complains of severe pain in her left hip which she rates 10 x 10 in intensity at its worst.  Pain is worse with any form of movement.  She denies any loss of consciousness after the fall She denied feeling dizzy or lightheaded prior to her fall, denies having any chest pain, no shortness of breath, no headache, no neck pain no nausea, no vomiting, no palpitations, no diaphoresis, no diarrhea, no fever, no chills, no cough, no urinary symptoms, no headache or blurred vision. Labs show sodium 133, potassium 4.1, chloride 95, bicarb 28, glucose 104, BUN 18, creatinine 0.95, calcium 8.8, alkaline phosphatase 79, albumin 3.0, AST 30, ALT 55, total protein 6.5, white count 17.6, hemoglobin 11.5, hematocrit 36.5, MCV 91.5, RDW 15.1, platelet count 356, PT 12.5,  INR 0.9 Respiratory viral panel is negative Chest x-ray reviewed by me shows radiation changes in the right hemithorax. Left hip x-ray shows subcapital left hip fracture. Twelve-lead EKG reviewed by me shows sinus tachycardia with LVH and ventricular bigeminy   ED Course: Patient is a 75 year old Caucasian female who presents to the ER via EMS for evaluation following a fall at home with complaints of left hip pain. X-ray showed a subcapital left hip fracture. Patient will be admitted to the hospital for further evaluation  Hospital course from Dr. Jimmye Norman 6/1-10/29/20: Pt was found to have left subcapital displaced femoral fracture and is s/p left hemiarthroplasty on 10/26/20. PT/OT evaluated pt and recommended SNF. Pt was also found to have normocytic anemia and did not require any transfusions while inpatient. For more information, please see previous progress/consult notes.   Discharge Diagnoses:  Principal Problem:   Closed left subtrochanteric femur fracture (HCC) Active Problems:   COPD (chronic obstructive pulmonary disease) (HCC)   PAD (peripheral artery disease) (HCC)   GERD (gastroesophageal reflux disease)   Tobacco abuse   Chronic systolic CHF (congestive heart failure) (HCC)   Malnutrition of moderate degree  Left subcapital displaced femoral neck fracture:s/p left hemiarthroplasty on 10/26/20 as per ortho surg. PT/OT recs SNF. 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 Continue on home dose of BB   SVT: continue on home dose of metoprolol   COPD: w/o exacerbation. Continue on bronchodilators & encourage incentive spirometry   Hx of AAA: s/p endovascular repair/stent in 2018.   Hx  of cancer: breast cancer s/p mastectomy in 2002 & hx of small cell lung cancer sp lobectomy approx 3 years ago. Hx tobacco use disorder  Normocytic anemia: H&H are labile. No need for a transfusion currently   Leukocytosis: likely  reactive.   Hyponatremia: labile. Started on NaCl tabs x 7 days. Will continue to monitor    Discharge Instructions  Discharge Instructions    Diet - low sodium heart healthy   Complete by: As directed    Diet Carb Modified   Complete by: As directed    Discharge instructions   Complete by: As directed    F/u w/ PCP in 1-2 weeks. F/u w/ ortho surg, Dr. Sharlet Salina, in 2 weeks   Increase activity slowly   Complete by: As directed    No wound care   Complete by: As directed      Allergies as of 10/30/2020   No Known Allergies     Medication List    TAKE these medications   acetaminophen 325 MG tablet Commonly known as: TYLENOL Take 650 mg by mouth every 6 (six) hours as needed for mild pain.   albuterol 108 (90 Base) MCG/ACT inhaler Commonly known as: VENTOLIN HFA Inhale 2 puffs into the lungs every 6 (six) hours as needed for wheezing or shortness of breath.   aspirin 81 MG EC tablet Take 1 tablet (81 mg total) by mouth daily. Swallow whole.   atorvastatin 10 MG tablet Commonly known as: LIPITOR Take 10 mg by mouth at bedtime.   dapagliflozin propanediol 10 MG Tabs tablet Commonly known as: FARXIGA Take 1 tablet (10 mg total) by mouth daily.   enoxaparin 40 MG/0.4ML injection Commonly known as: LOVENOX Inject 0.4 mLs (40 mg total) into the skin daily for 14 days.   fluticasone-salmeterol 115-21 MCG/ACT inhaler Commonly known as: ADVAIR HFA Inhale 2 puffs into the lungs 2 (two) times daily.   furosemide 20 MG tablet Commonly known as: LASIX Take 1 tablet (20 mg total) by mouth every Monday, Wednesday, and Friday.   HYDROcodone-acetaminophen 5-325 MG tablet Commonly known as: NORCO/VICODIN Take 1-2 tablets by mouth every 6 (six) hours as needed for up to 1 day for moderate pain or severe pain.   ipratropium 0.02 % nebulizer solution Commonly known as: ATROVENT Take 3 mLs by nebulization in the morning, at noon, in the evening, and at bedtime.   metoprolol  succinate 25 MG 24 hr tablet Commonly known as: TOPROL-XL Take 1 tablet (25 mg total) by mouth daily.   nicotine 14 mg/24hr patch Commonly known as: NICODERM CQ - dosed in mg/24 hours Place 1 patch (14 mg total) onto the skin daily.   omeprazole 20 MG capsule Commonly known as: PRILOSEC Take 1 capsule (20 mg total) by mouth daily.   sodium chloride 1 g tablet Take 1 tablet (1 g total) by mouth 2 (two) times daily with a meal for 7 days.   vitamin B-12 1000 MCG tablet Commonly known as: CYANOCOBALAMIN Take 1 tablet (1,000 mcg total) by mouth daily.       Contact information for follow-up providers    Renee Harder, MD. Go on 11/11/2020.   Specialty: Orthopedic Surgery Why: @ 1:30 pm Contact information: [place] [address] [phone]            Contact information for after-discharge care    [place] [facility] .   Service: Skilled Nursing  Contact information: [place] [phone]                 No Known Allergies  Consultations:  Ortho surg, Dr. Sharlet Salina   Procedures/Studies: DG Abd 1 View  Result Date: 10/15/2020 CLINICAL DATA:  Constipation with pain EXAM: ABDOMEN - 1 VIEW COMPARISON:  CT abdomen and pelvis May 04, 2020 FINDINGS: There is diffuse stool throughout the colon. There is no bowel dilatation or air-fluid level to suggest bowel obstruction. No free air. There is an aortic and bilateral iliac artery stent, unchanged. There are stable pelvic calcifications, likely of vascular etiology. Status post kyphoplasty at L4. IMPRESSION: Diffuse stool throughout colon, likely due to constipation. No bowel obstruction or free air evident. Aorto bi-iliac stent again noted. Electronically Signed   By: Lowella Grip III M.D.   On: 10/15/2020 10:00   CT ANGIO CHEST PE W OR WO CONTRAST  Result  Date: 10/12/2020 CLINICAL DATA:  Positive D-dimer and shortness of breath EXAM: CT ANGIOGRAPHY CHEST WITH CONTRAST TECHNIQUE: Multidetector CT imaging of the chest was performed using the standard protocol during bolus administration of intravenous contrast. Multiplanar CT image reconstructions and MIPs were obtained to evaluate the vascular anatomy. CONTRAST:  68mL OMNIPAQUE IOHEXOL 350 MG/ML SOLN COMPARISON:  Plain film from earlier in the same day, 09/07/2020 CT. FINDINGS: Cardiovascular: Thoracic aorta demonstrates atherosclerotic calcifications without aneurysmal dilatation or dissection. Coronary calcifications are noted. No significant cardiac enlargement is noted. The pulmonary artery is well visualized within normal branching pattern even on the right where significant post irradiation changes are noted. Mediastinum/Nodes: Thoracic inlet is within normal limits. The esophagus is unremarkable. No sizable hilar or mediastinal adenopathy is noted. Lungs/Pleura: Bilateral pleural effusions are noted right considerably greater than left. Post radiation changes are noted centrally in the right lung. The degree of consolidation is similar to that seen on prior plain film but progressed when compared with the prior CT examination dated 09/07/2020. Left lung demonstrates some patchy ground-glass airspace opacity likely postinflammatory in nature. Upper Abdomen: Stent graft is noted in the upper abdomen. No other focal abnormality is seen. Musculoskeletal: Degenerative changes of the thoracic spine are noted. No acute bony abnormality is noted. Review of the MIP images confirms the above findings. IMPRESSION: Increasing consolidation in the right lung when compared with the prior exam representing a combination of previously seen post irradiation changes as well as acute pneumonic infiltrate. Large right-sided pleural effusion is noted. Small left pleural effusion with patchy airspace opacity consistent with  multifocal pneumonia. No evidence of pulmonary emboli. Aortic Atherosclerosis (ICD10-I70.0). Electronically Signed   By: Inez Catalina M.D.   On: 10/12/2020 21:54   DG Pelvis Portable  Result Date: 10/26/2020 CLINICAL DATA:  Status post left hip surgery EXAM: PORTABLE PELVIS 1-2 VIEWS COMPARISON:  10/24/2020 FINDINGS: Left hip hemiarthroplasty is noted. No fracture or dislocation is noted. Aortic stent graft is noted as well as changes of prior vertebral augmentation. No soft tissue abnormality is noted. IMPRESSION: Status post left hip hemiarthroplasty. Electronically Signed   By: Inez Catalina M.D.   On: 10/26/2020 17:00   DG Chest Port 1 View  Result Date: 10/24/2020 CLINICAL DATA:  Preop left hip pain EXAM: PORTABLE CHEST 1 VIEW COMPARISON:  10/15/2020 FINDINGS: Radiation changes in the right upper lobe with upper hilar retraction. Small right pleural effusion is improved. Left lung is clear.  No pneumothorax. The heart is normal in size. Left chest power port terminates the  cavoatrial junction. Midthoracic vertebral augmentation. IMPRESSION: Radiation changes in the right hemithorax. Electronically Signed   By: Julian Hy M.D.   On: 10/24/2020 06:25   DG Chest Port 1 View  Result Date: 10/15/2020 CLINICAL DATA:  Pleural effusion. EXAM: PORTABLE CHEST 1 VIEW COMPARISON:  Chest radiograph dated 10/12/2020. FINDINGS: The heart size is normal. Vascular calcifications are seen in the aortic arch. A left internal jugular central venous port catheter tip overlies the superior cavoatrial junction. A moderate right pleural effusion appears similar to prior exam. Associated atelectasis/airspace disease appears decreased. Mild diffuse bilateral interstitial opacities appear decreased since prior exam. Post radiation changes in the right lung are redemonstrated. Vertebroplasty changes are seen in the thoracic spine. IMPRESSION: No significant change in a moderate right pleural effusion. Decreased bilateral  interstitial and airspace opacities. Electronically Signed   By: Zerita Boers M.D.   On: 10/15/2020 15:32   DG Chest Portable 1 View  Result Date: 10/12/2020 CLINICAL DATA:  Shortness of breath. Previous history of lung carcinoma and radiation therapy. EXAM: PORTABLE CHEST 1 VIEW COMPARISON:  09/06/2020 FINDINGS: Heart size and mediastinal contours are stable. Extensive pleural-parenchymal scarring and volume loss again seen in the right upper lobe. A new small right pleural effusion is seen, with atelectasis or infiltrate at the right lung base. Increased diffuse interstitial infiltrates are also seen compared to previous study with Kerley B-lines, suspicious for diffuse interstitial edema. Left-sided Port-A-Cath remains in place. IMPRESSION: New small right pleural effusion, and right basilar atelectasis versus infiltrate. Increased diffuse interstitial infiltrates, suspicious for pulmonary interstitial edema. Stable post radiation changes in right upper hemithorax. Electronically Signed   By: Marlaine Hind M.D.   On: 10/12/2020 11:54   ECHOCARDIOGRAM COMPLETE  Result Date: 10/13/2020    ECHOCARDIOGRAM REPORT   Patient Name:   Roberta [name] Date of Exam: 10/13/2020 Medical Rec #:  [id number]         Height:       67.0 in Accession #:    [id number]        Weight:       154.4 lb Date of Birth:  [birth year]         BSA:          1.812 m Patient Age:    21 years          BP:           127/83 mmHg Patient Gender: F                 HR:           131 bpm. Exam Location:  [facility] Procedure: 2D Echo, Cardiac Doppler and Color Doppler Indications:     Acute respiratory distress R06.03  History:         Patient has no prior history of Echocardiogram examinations.                  COPD. Personal history of chemotherapy, tobacco abuse.  Sonographer:     Sherrie Sport RDCS (AE) Referring Phys:  [id number] [place] Diagnosing Phys: Kate Sable MD  Sonographer Comments: Image acquisition challenging due to  mastectomy and Image acquisition challenging due to COPD. IMPRESSIONS  1. Left ventricular ejection fraction, by estimation, is 25 to 30%. Left ventricular ejection fraction by 2D MOD biplane is 29.6 %. The left ventricle has severely decreased function. The left ventricle demonstrates global hypokinesis. The left ventricular internal cavity size was mildly to moderately dilated. Left ventricular  diastolic parameters are consistent with Grade II diastolic dysfunction (pseudonormalization).  2. Right ventricular systolic function is moderately reduced. The right ventricular size is normal.  3. Left atrial size was moderately dilated.  4. The mitral valve is degenerative. Mild to moderate mitral valve regurgitation. Severe mitral annular calcification.  5. The aortic valve is calcified. Aortic valve regurgitation is not visualized. Moderate aortic valve stenosis. FINDINGS  Left Ventricle: Left ventricular ejection fraction, by estimation, is 25 to 30%. Left ventricular ejection fraction by 2D MOD biplane is 29.6 %. The left ventricle has severely decreased function. The left ventricle demonstrates global hypokinesis. The left ventricular internal cavity size was mildly to moderately dilated. There is no left ventricular hypertrophy. Left ventricular diastolic parameters are consistent with Grade II diastolic dysfunction (pseudonormalization). Right Ventricle: The right ventricular size is normal. No increase in right ventricular wall thickness. Right ventricular systolic function is moderately reduced. Left Atrium: Left atrial size was moderately dilated. Right Atrium: Right atrial size was normal in size. Pericardium: There is no evidence of pericardial effusion. Mitral Valve: The mitral valve is degenerative in appearance. There is moderate calcification of the mitral valve leaflet(s). Severe mitral annular calcification. Mild to moderate mitral valve regurgitation. MV peak gradient, 5.2 mmHg. The mean mitral valve  gradient is 2.0 mmHg. Tricuspid Valve: The tricuspid valve is not well visualized. Tricuspid valve regurgitation is not demonstrated. Aortic Valve: The aortic valve is calcified. Aortic valve regurgitation is not visualized. Moderate aortic stenosis is present. Aortic valve mean gradient measures 7.3 mmHg. Aortic valve peak gradient measures 13.2 mmHg. Aortic valve area, by VTI measures 1.01 cm. Pulmonic Valve: The pulmonic valve was not well visualized. Pulmonic valve regurgitation is not visualized. Aorta: The aortic root is normal in size and structure. Venous: The inferior vena cava was not well visualized. IAS/Shunts: No atrial level shunt detected by color flow Doppler.  LEFT VENTRICLE PLAX 2D                        Biplane EF (MOD) LVIDd:         5.53 cm         LV Biplane EF:   Left LVIDs:         4.81 cm                          ventricular LV PW:         0.99 cm                          ejection LV IVS:        0.66 cm                          fraction by LVOT diam:     2.10 cm                          2D MOD LV SV:         29                               biplane is LV SV Index:   16  29.6 %. LVOT Area:     3.46 cm                                Diastology                                LV e' medial:    4.46 cm/s LV Volumes (MOD)               LV E/e' medial:  28.5 LV vol d, MOD    101.0 ml      LV e' lateral:   3.05 cm/s A2C:                           LV E/e' lateral: 41.6 LV vol d, MOD    95.9 ml A4C: LV vol s, MOD    69.3 ml A2C: LV vol s, MOD    64.1 ml A4C: LV SV MOD A2C:   31.7 ml LV SV MOD A4C:   95.9 ml LV SV MOD BP:    29.8 ml RIGHT VENTRICLE RV Basal diam:  2.66 cm RV S prime:     10.40 cm/s TAPSE (M-mode): 2.7 cm LEFT ATRIUM             Index       RIGHT ATRIUM           Index LA diam:        3.40 cm 1.88 cm/m  RA Area:     10.40 cm LA Vol (A2C):   85.9 ml 47.42 ml/m RA Volume:   22.00 ml  12.14 ml/m LA Vol (A4C):   28.4 ml 15.68 ml/m LA Biplane Vol: 54.8 ml  30.25 ml/m  AORTIC VALVE                    PULMONIC VALVE AV Area (Vmax):    1.04 cm     PV Vmax:        0.54 m/s AV Area (Vmean):   1.00 cm     PV Peak grad:   1.1 mmHg AV Area (VTI):     1.01 cm     RVOT Peak grad: 2 mmHg AV Vmax:           181.67 cm/s AV Vmean:          124.333 cm/s AV VTI:            0.283 m AV Peak Grad:      13.2 mmHg AV Mean Grad:      7.3 mmHg LVOT Vmax:         54.80 cm/s LVOT Vmean:        36.000 cm/s LVOT VTI:          0.082 m LVOT/AV VTI ratio: 0.29  AORTA Ao Root diam: 2.50 cm MITRAL VALVE                TRICUSPID VALVE MV Area (PHT): 6.65 cm     TR Peak grad:   25.6 mmHg MV Area VTI:   1.18 cm     TR Vmax:        253.00 cm/s MV Peak grad:  5.2 mmHg MV Mean grad:  2.0 mmHg     SHUNTS MV Vmax:       1.14 m/s  Systemic VTI:  0.08 m MV Vmean:      67.9 cm/s    Systemic Diam: 2.10 cm MV Decel Time: 114 msec MV E velocity: 127.00 cm/s MV A velocity: 97.70 cm/s MV E/A ratio:  1.30 Kate Sable MD Electronically signed by Kate Sable MD Signature Date/Time: 10/13/2020/5:18:55 PM    Final    DG HIP UNILAT W OR W/O PELVIS 2-3 VIEWS LEFT  Result Date: 10/24/2020 CLINICAL DATA:  Fall, left hip pain EXAM: DG HIP (WITH OR WITHOUT PELVIS) 2-3V LEFT COMPARISON:  None. FINDINGS: Subcapital left hip fracture. Mild foreshortening and varus angulation. Bilateral hip joint spaces are preserved. Visualized bony pelvis appears intact. Degenerative changes of the lower lumbar spine. Bilateral iliac stents. IMPRESSION: Subcapital left hip fracture, as above. Electronically Signed   By: Julian Hy M.D.   On: 10/24/2020 06:26      Subjective: Pt c/o left hip pain, improved from day prior.   Discharge Exam: Vitals:   10/30/20 0131 10/30/20 0354  BP: 119/64 123/69  Pulse: 76 74  Resp: 20 17  Temp:  97.7 F (36.5 C)  SpO2: 97% 96%   Vitals:   10/29/20 1556 10/29/20 2016 10/30/20 0131 10/30/20 0354  BP: 134/71 133/81 119/64 123/69  Pulse: 91 92 76 74  Resp: 16 17  20 17   Temp: 98.2 F (36.8 C) 98.5 F (36.9 C)  97.7 F (36.5 C)  TempSrc: Oral Oral    SpO2: 97% 94% 97% 96%  Weight:      Height:        General exam: Appears calm & comfortable Respiratory system: decreased breath sounds b/l  Cardiovascular system: S1/S2+. No rubs or clicks  Gastrointestinal system: Abd is soft, NT, ND & normal bowel sounds  Central nervous system: Alert and oriented. Moves all extremities  Psychiatry: judgement and insight appears normal. Flat mood and affect    The results of significant diagnostics from this hospitalization (including imaging, microbiology, ancillary and laboratory) are listed below for reference.     Microbiology: Recent Results (from the past 240 hour(s))  Resp Panel by RT-PCR (Flu A&B, Covid) Nasopharyngeal Swab     Status: None   Collection Time: 10/24/20  4:59 AM   Specimen: Nasopharyngeal Swab; Nasopharyngeal(NP) swabs in vial transport medium  Result Value Ref Range Status   SARS Coronavirus 2 by RT PCR NEGATIVE NEGATIVE Final    Comment: (NOTE) SARS-CoV-2 target nucleic acids are NOT DETECTED.  The SARS-CoV-2 RNA is generally detectable in upper respiratory specimens during the acute phase of infection. The lowest concentration of SARS-CoV-2 viral copies this assay can detect is 138 copies/mL. A negative result does not preclude SARS-Cov-2 infection and should not be used as the sole basis for treatment or other patient management decisions. A negative result may occur with  improper specimen collection/handling, submission of specimen other than nasopharyngeal swab, presence of viral mutation(s) within the areas targeted by this assay, and inadequate number of viral copies(<138 copies/mL). A negative result must be combined with clinical observations, patient history, and epidemiological information. The expected result is Negative.  Fact Sheet for Patients:  EntrepreneurPulse.com.au  Fact Sheet for  Healthcare Providers:  IncredibleEmployment.be  This test is no t yet approved or cleared by the [place] FDA and  has been authorized for detection and/or diagnosis of SARS-CoV-2 by FDA under an Emergency Use Authorization (EUA). This EUA will remain  in effect (meaning this test can be used) for the duration of the COVID-19 declaration under  Section 564(b)(1) of the Act, 21 U.S.C.section 360bbb-3(b)(1), unless the authorization is terminated  or revoked sooner.       Influenza A by PCR NEGATIVE NEGATIVE Final   Influenza B by PCR NEGATIVE NEGATIVE Final    Comment: (NOTE) The Xpert Xpress SARS-CoV-2/FLU/RSV plus assay is intended as an aid in the diagnosis of influenza from Nasopharyngeal swab specimens and should not be used as a sole basis for treatment. Nasal washings and aspirates are unacceptable for Xpert Xpress SARS-CoV-2/FLU/RSV testing.  Fact Sheet for Patients: EntrepreneurPulse.com.au  Fact Sheet for Healthcare Providers: IncredibleEmployment.be  This test is not yet approved or cleared by the [place] FDA and has been authorized for detection and/or diagnosis of SARS-CoV-2 by FDA under an Emergency Use Authorization (EUA). This EUA will remain in effect (meaning this test can be used) for the duration of the COVID-19 declaration under Section 564(b)(1) of the Act, 21 U.S.C. section 360bbb-3(b)(1), unless the authorization is terminated or revoked.  Performed at [facility], [place], [place] [address]   SARS CORONAVIRUS 2 (TAT 6-24 HRS) Nasopharyngeal Nasopharyngeal Swab     Status: None   Collection Time: 10/29/20 12:33 PM   Specimen: Nasopharyngeal Swab  Result Value Ref Range Status   SARS Coronavirus 2 NEGATIVE NEGATIVE Final    Comment: (NOTE) SARS-CoV-2 target nucleic acids are NOT DETECTED.  The SARS-CoV-2 RNA is generally detectable in upper and  lower respiratory specimens during the acute phase of infection. Negative results do not preclude SARS-CoV-2 infection, do not rule out co-infections with other pathogens, and should not be used as the sole basis for treatment or other patient management decisions. Negative results must be combined with clinical observations, patient history, and epidemiological information. The expected result is Negative.  Fact Sheet for Patients: SugarRoll.be  Fact Sheet for Healthcare Providers: https://www.woods-mathews.com/  This test is not yet approved or cleared by the [place] FDA and  has been authorized for detection and/or diagnosis of SARS-CoV-2 by FDA under an Emergency Use Authorization (EUA). This EUA will remain  in effect (meaning this test can be used) for the duration of the COVID-19 declaration under Se ction 564(b)(1) of the Act, 21 U.S.C. section 360bbb-3(b)(1), unless the authorization is terminated or revoked sooner.  Performed at [facility], [address] [place], [place] [address]      Labs: BNP (last 3 results) Recent Labs    10/12/20 1112  BNP 8,416.6*   Basic Metabolic Panel: Recent Labs  Lab 10/24/20 0459 10/25/20 0505 10/28/20 0227 10/29/20 0456 10/30/20 0420  NA 133* 131* 134* 132* 131*  K 4.1 4.0 3.7 4.8 3.9  CL 95* 96* 94* 95* 93*  CO2 28 24 30 30 30   GLUCOSE 104* 81 163* 116* 99  BUN 18 21 37* 27* 30*  CREATININE 0.95 0.78 1.07* 0.89 0.84  CALCIUM 8.8* 8.4* 8.1* 8.4* 8.3*   Liver Function Tests: Recent Labs  Lab 10/24/20 0459  AST 30  ALT 55*  ALKPHOS 79  BILITOT 0.7  PROT 6.5  ALBUMIN 3.0*   No results for input(s): LIPASE, AMYLASE in the last 168 hours. No results for input(s): AMMONIA in the last 168 hours. CBC: Recent Labs  Lab 10/24/20 0459 10/25/20 0505 10/28/20 0227 10/29/20 0456 10/30/20 0420  WBC 17.6* 17.7* 12.7* 12.4* 9.4  NEUTROABS 14.4*  --   --   --   --    HGB 11.5* 10.6* 9.1* 9.3* 8.7*  HCT 36.5 32.7* 28.5*  28.7* 26.8*  MCV 91.5 89.8 91.6 91.7 89.3  PLT 356 306 238 277 276   Cardiac Enzymes: No results for input(s): CKTOTAL, CKMB, CKMBINDEX, TROPONINI in the last 168 hours. BNP: Invalid input(s): POCBNP CBG: No results for input(s): GLUCAP in the last 168 hours. D-Dimer No results for input(s): DDIMER in the last 72 hours. Hgb A1c No results for input(s): HGBA1C in the last 72 hours. Lipid Profile No results for input(s): CHOL, HDL, LDLCALC, TRIG, CHOLHDL, LDLDIRECT in the last 72 hours. Thyroid function studies No results for input(s): TSH, T4TOTAL, T3FREE, THYROIDAB in the last 72 hours.  Invalid input(s): FREET3 Anemia work up No results for input(s): VITAMINB12, FOLATE, FERRITIN, TIBC, IRON, RETICCTPCT in the last 72 hours. Urinalysis    Component Value Date/Time   COLORURINE STRAW (A) 09/06/2020 1954   APPEARANCEUR CLEAR (A) 09/06/2020 1954   LABSPEC 1.011 09/06/2020 1954   PHURINE 7.0 09/06/2020 1954   GLUCOSEU NEGATIVE 09/06/2020 1954   HGBUR SMALL (A) 09/06/2020 [place] NEGATIVE 09/06/2020 [place] NEGATIVE 09/06/2020 1954   PROTEINUR NEGATIVE 09/06/2020 1954   NITRITE NEGATIVE 09/06/2020 1954   LEUKOCYTESUR NEGATIVE 09/06/2020 1954   Sepsis Labs Invalid input(s): PROCALCITONIN,  WBC,  LACTICIDVEN Microbiology Recent Results (from the past 240 hour(s))  Resp Panel by RT-PCR (Flu A&B, Covid) Nasopharyngeal Swab     Status: None   Collection Time: 10/24/20  4:59 AM   Specimen: Nasopharyngeal Swab; Nasopharyngeal(NP) swabs in vial transport medium  Result Value Ref Range Status   SARS Coronavirus 2 by RT PCR NEGATIVE NEGATIVE Final    Comment: (NOTE) SARS-CoV-2 target nucleic acids are NOT DETECTED.  The SARS-CoV-2 RNA is generally detectable in upper respiratory specimens during the acute phase of infection. The lowest concentration of SARS-CoV-2 viral copies this assay can detect is 138  copies/mL. A negative result does not preclude SARS-Cov-2 infection and should not be used as the sole basis for treatment or other patient management decisions. A negative result may occur with  improper specimen collection/handling, submission of specimen other than nasopharyngeal swab, presence of viral mutation(s) within the areas targeted by this assay, and inadequate number of viral copies(<138 copies/mL). A negative result must be combined with clinical observations, patient history, and epidemiological information. The expected result is Negative.  Fact Sheet for Patients:  EntrepreneurPulse.com.au  Fact Sheet for Healthcare Providers:  IncredibleEmployment.be  This test is no t yet approved or cleared by the [place] FDA and  has been authorized for detection and/or diagnosis of SARS-CoV-2 by FDA under an Emergency Use Authorization (EUA). This EUA will remain  in effect (meaning this test can be used) for the duration of the COVID-19 declaration under Section 564(b)(1) of the Act, 21 U.S.C.section 360bbb-3(b)(1), unless the authorization is terminated  or revoked sooner.       Influenza A by PCR NEGATIVE NEGATIVE Final   Influenza B by PCR NEGATIVE NEGATIVE Final    Comment: (NOTE) The Xpert Xpress SARS-CoV-2/FLU/RSV plus assay is intended as an aid in the diagnosis of influenza from Nasopharyngeal swab specimens and should not be used as a sole basis for treatment. Nasal washings and aspirates are unacceptable for Xpert Xpress SARS-CoV-2/FLU/RSV testing.  Fact Sheet for Patients: EntrepreneurPulse.com.au  Fact Sheet for Healthcare Providers: IncredibleEmployment.be  This test is not yet approved or cleared by the [place] FDA and has been authorized for detection and/or diagnosis of SARS-CoV-2 by FDA under an Emergency Use Authorization (EUA). This EUA will remain  in effect (meaning  this test can be used) for the duration of the COVID-19 declaration under Section 564(b)(1) of the Act, 21 U.S.C. section 360bbb-3(b)(1), unless the authorization is terminated or revoked.  Performed at [facility], [place], [place] [address]   SARS CORONAVIRUS 2 (TAT 6-24 HRS) Nasopharyngeal Nasopharyngeal Swab     Status: None   Collection Time: 10/29/20 12:33 PM   Specimen: Nasopharyngeal Swab  Result Value Ref Range Status   SARS Coronavirus 2 NEGATIVE NEGATIVE Final    Comment: (NOTE) SARS-CoV-2 target nucleic acids are NOT DETECTED.  The SARS-CoV-2 RNA is generally detectable in upper and lower respiratory specimens during the acute phase of infection. Negative results do not preclude SARS-CoV-2 infection, do not rule out co-infections with other pathogens, and should not be used as the sole basis for treatment or other patient management decisions. Negative results must be combined with clinical observations, patient history, and epidemiological information. The expected result is Negative.  Fact Sheet for Patients: SugarRoll.be  Fact Sheet for Healthcare Providers: https://www.woods-mathews.com/  This test is not yet approved or cleared by the [place] FDA and  has been authorized for detection and/or diagnosis of SARS-CoV-2 by FDA under an Emergency Use Authorization (EUA). This EUA will remain  in effect (meaning this test can be used) for the duration of the COVID-19 declaration under Se ction 564(b)(1) of the Act, 21 U.S.C. section 360bbb-3(b)(1), unless the authorization is terminated or revoked sooner.  Performed at [facility], [place] [address], [place], [place] [address]      Time coordinating discharge: Over 30 minutes  SIGNED:   Wyvonnia Dusky, MD  [facility] 10/30/2020, 7:21 AM Pager   If 7PM-7AM, please contact night-coverage

## 2020-10-29 NOTE — Plan of Care (Signed)
Problem: Education: Goal: Knowledge of General Education information will improve Description: Including pain rating scale, medication(s)/side effects and non-pharmacologic comfort measures 10/29/2020 1339 by Conard Novak, RN Outcome: Adequate for Discharge 10/29/2020 1339 by Conard Novak, RN Outcome: Adequate for Discharge   Problem: Health Behavior/Discharge Planning: Goal: Ability to manage health-related needs will improve 10/29/2020 1339 by Conard Novak, RN Outcome: Adequate for Discharge 10/29/2020 1339 by Conard Novak, RN Outcome: Adequate for Discharge   Problem: Clinical Measurements: Goal: Ability to maintain clinical measurements within normal limits will improve 10/29/2020 1339 by Conard Novak, RN Outcome: Adequate for Discharge 10/29/2020 1339 by Conard Novak, RN Outcome: Adequate for Discharge Goal: Will remain free from infection 10/29/2020 1339 by Conard Novak, RN Outcome: Adequate for Discharge 10/29/2020 1339 by Conard Novak, RN Outcome: Adequate for Discharge Goal: Diagnostic test results will improve 10/29/2020 1339 by Conard Novak, RN Outcome: Adequate for Discharge 10/29/2020 1339 by Conard Novak, RN Outcome: Adequate for Discharge Goal: Respiratory complications will improve 10/29/2020 1339 by Conard Novak, RN Outcome: Adequate for Discharge 10/29/2020 1339 by Conard Novak, RN Outcome: Adequate for Discharge Goal: Cardiovascular complication will be avoided 10/29/2020 1339 by Conard Novak, RN Outcome: Adequate for Discharge 10/29/2020 1339 by Conard Novak, RN Outcome: Adequate for Discharge   Problem: Activity: Goal: Risk for activity intolerance will decrease 10/29/2020 1339 by Conard Novak, RN Outcome: Adequate for Discharge 10/29/2020 1339 by Conard Novak, RN Outcome: Adequate for Discharge   Problem: Nutrition: Goal: Adequate nutrition will be maintained 10/29/2020 1339 by Conard Novak,  RN Outcome: Adequate for Discharge 10/29/2020 1339 by Conard Novak, RN Outcome: Adequate for Discharge   Problem: Coping: Goal: Level of anxiety will decrease 10/29/2020 1339 by Conard Novak, RN Outcome: Adequate for Discharge 10/29/2020 1339 by Conard Novak, RN Outcome: Adequate for Discharge   Problem: Elimination: Goal: Will not experience complications related to bowel motility 10/29/2020 1339 by Conard Novak, RN Outcome: Adequate for Discharge 10/29/2020 1339 by Conard Novak, RN Outcome: Adequate for Discharge Goal: Will not experience complications related to urinary retention 10/29/2020 1339 by Conard Novak, RN Outcome: Adequate for Discharge 10/29/2020 1339 by Conard Novak, RN Outcome: Adequate for Discharge   Problem: Pain Managment: Goal: General experience of comfort will improve 10/29/2020 1339 by Conard Novak, RN Outcome: Adequate for Discharge 10/29/2020 1339 by Conard Novak, RN Outcome: Adequate for Discharge   Problem: Safety: Goal: Ability to remain free from injury will improve 10/29/2020 1339 by Conard Novak, RN Outcome: Adequate for Discharge 10/29/2020 1339 by Conard Novak, RN Outcome: Adequate for Discharge   Problem: Skin Integrity: Goal: Risk for impaired skin integrity will decrease 10/29/2020 1339 by Conard Novak, RN Outcome: Adequate for Discharge 10/29/2020 1339 by Conard Novak, RN Outcome: Adequate for Discharge   Problem: Malnutrition  (NI-5.2) Goal: Food and/or nutrient delivery Description: Individualized approach for food/nutrient provision. Outcome: Adequate for Discharge   Problem: Acute Rehab PT Goals(only PT should resolve) Goal: Pt Will Ambulate Outcome: Adequate for Discharge Goal: Pt/caregiver will Perform Home Exercise Program Outcome: Adequate for Discharge Goal: PT Additional Goal #1 Outcome: Adequate for Discharge   Problem: Acute Rehab OT Goals (only OT should resolve) Goal:  Pt. Will Perform Lower Body Dressing Outcome: Adequate for Discharge Goal: Pt. Will Transfer To Toilet Outcome: Adequate for Discharge Goal: Pt. Will Perform Toileting-Clothing Manipulation Outcome: Adequate for Discharge

## 2020-10-29 NOTE — TOC Progression Note (Signed)
Transition of Care [facility]) - Progression Note    Patient Details  Name: Roberta [name] MRN: [id number] Date of Birth: 11/19/1945  [facility] CM/SW [place], RN Phone Number: 10/29/2020, 4:03 PM  Clinical Narrative:    Called EMS to postpone transport until Covid test comes back   Expected Discharge Plan:  (TBD) Barriers to Discharge: Continued Medical Work up  Expected Discharge Plan and Services Expected Discharge Plan:  (TBD)   Discharge Planning Services: CM Consult   Living arrangements for the past 2 months: Single Family Home Expected Discharge Date: 10/29/20               DME Arranged: N/A                     Social Determinants of Health (SDOH) Interventions    Readmission Risk Interventions Readmission Risk Prevention Plan 10/25/2020  Transportation Screening Complete  PCP or Specialist Appt within 5-7 Days Complete  Home Care Screening Complete  Medication Review (RN CM) Complete  Some recent data might be hidden

## 2020-10-29 NOTE — Care Management Important Message (Signed)
Important Message  Patient Details  Name: Roberta [name] MRN: [id number] Date of Birth: 06-11-1945   Medicare Important Message Given:  Yes     Juliann Pulse A Reakwon Barren 10/29/2020, 10:45 AM

## 2020-10-29 NOTE — Progress Notes (Signed)
PROGRESS NOTE    Roberta [name]  DXI:[id number] DOB: 03-29-1946 DOA: 10/24/2020 PCP: Gayland Curry, MD  Assessment & Plan:   Principal Problem:   Closed left subtrochanteric femur fracture ([place]) Active Problems:   COPD (chronic obstructive pulmonary disease) (HCC)   PAD (peripheral artery disease) (HCC)   GERD (gastroesophageal reflux disease)   Tobacco abuse   Chronic systolic CHF (congestive heart failure) (HCC)   Malnutrition of moderate degree   Left subcapital displaced femoral neck fracture: s/p left hemiarthroplasty on 10/26/20 as per ortho surg. PT/OT recs SNF. Waiting on SNF placement. 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 Continue on home dose of BB   SVT: continue on home dose of metoprolol   COPD: w/o exacerbation. Continue on bronchodilators & encourage incentive spirometry   Hx of AAA: s/p endovascular repair/stent in 2018.   Hx of cancer: breast cancer s/p mastectomy in 2002 & hx of small cell lung cancer sp lobectomy approx 3 years ago. Hx tobacco use disorder  Normocytic anemia: H&H are labile. No need for a transfusion currently   Leukocytosis: likely reactive.   Hyponatremia: labile. Will continue to monitor   DVT prophylaxis: lovenox  Code Status: full  Family Communication: discussed pt's care w/ pt's daughter, Darrick Penna, and answered her questions  Disposition Plan:  D/c to SNF    Level of care: Med-Surg   Status is: Inpatient  Remains inpatient appropriate because:Unsafe d/c plan, IV treatments appropriate due to intensity of illness or inability to take PO and Inpatient level of care appropriate due to severity of illness, still waiting on SNF placement    Dispo: The patient is from: Home              Anticipated d/c is to: SNF              Patient currently is medically stable for d/c    Difficult to place patient : unclear    Consultants:   Ortho surg   Procedures:     Antimicrobials:    Subjective: Pt c/o left hip pain, improved from day prior.   Objective: Vitals:   10/28/20 1142 10/28/20 1533 10/28/20 2018 10/29/20 0508  BP: 119/72 (!) 153/88 110/60 126/65  Pulse: 80 92 81 84  Resp: 18 16 18 14   Temp: 97.9 F (36.6 C) 97.8 F (36.6 C) 97.8 F (36.6 C) 97.6 F (36.4 C)  TempSrc: Oral Oral  Oral  SpO2: 95% 94% 94% 95%  Weight:      Height:        Intake/Output Summary (Last 24 hours) at 10/29/2020 0717 Last data filed at 10/29/2020 0525 Gross per 24 hour  Intake 720 ml  Output 800 ml  Net -80 ml   Filed Weights   10/24/20 0454  Weight: 64.7 kg    Examination:  General exam: Appears calm & comfortable Respiratory system: decreased breath sounds b/l  Cardiovascular system: S1/S2+. No rubs or clicks  Gastrointestinal system: Abd is soft, NT, ND & normal bowel sounds  Central nervous system: Alert and oriented. Moves all extremities  Psychiatry: judgement and insight appears normal. Flat mood and affect    Data Reviewed: I have personally reviewed following labs and imaging studies  CBC: Recent Labs  Lab 10/24/20 0459 10/25/20 0505 10/28/20 0227 10/29/20 0456  WBC 17.6* 17.7* 12.7* 12.4*  NEUTROABS 14.4*  --   --   --   HGB 11.5* 10.6* 9.1* 9.3*  HCT 36.5 32.7* 28.5* 28.7*  MCV 91.5 89.8 91.6 91.7  PLT 356 306 238 803   Basic Metabolic Panel: Recent Labs  Lab 10/24/20 0459 10/25/20 0505 10/28/20 0227 10/29/20 0456  NA 133* 131* 134* 132*  K 4.1 4.0 3.7 4.8  CL 95* 96* 94* 95*  CO2 28 24 30 30   GLUCOSE 104* 81 163* 116*  BUN 18 21 37* 27*  CREATININE 0.95 0.78 1.07* 0.89  CALCIUM 8.8* 8.4* 8.1* 8.4*   GFR: Estimated Creatinine Clearance: 53.9 mL/min (by C-G formula based on SCr of 0.89 mg/dL). Liver Function Tests: Recent Labs  Lab 10/24/20 0459  AST 30  ALT 55*  ALKPHOS 79  BILITOT 0.7  PROT 6.5  ALBUMIN 3.0*   No results for input(s): LIPASE, AMYLASE in the last 168 hours. No results for  input(s): AMMONIA in the last 168 hours. Coagulation Profile: Recent Labs  Lab 10/24/20 0459  INR 0.9   Cardiac Enzymes: No results for input(s): CKTOTAL, CKMB, CKMBINDEX, TROPONINI in the last 168 hours. BNP (last 3 results) No results for input(s): PROBNP in the last 8760 hours. HbA1C: No results for input(s): HGBA1C in the last 72 hours. CBG: No results for input(s): GLUCAP in the last 168 hours. Lipid Profile: No results for input(s): CHOL, HDL, LDLCALC, TRIG, CHOLHDL, LDLDIRECT in the last 72 hours. Thyroid Function Tests: No results for input(s): TSH, T4TOTAL, FREET4, T3FREE, THYROIDAB in the last 72 hours. Anemia Panel: No results for input(s): VITAMINB12, FOLATE, FERRITIN, TIBC, IRON, RETICCTPCT in the last 72 hours. Sepsis Labs: No results for input(s): PROCALCITON, LATICACIDVEN in the last 168 hours.  Recent Results (from the past 240 hour(s))  Resp Panel by RT-PCR (Flu A&B, Covid) Nasopharyngeal Swab     Status: None   Collection Time: 10/24/20  4:59 AM   Specimen: Nasopharyngeal Swab; Nasopharyngeal(NP) swabs in vial transport medium  Result Value Ref Range Status   SARS Coronavirus 2 by RT PCR NEGATIVE NEGATIVE Final    Comment: (NOTE) SARS-CoV-2 target nucleic acids are NOT DETECTED.  The SARS-CoV-2 RNA is generally detectable in upper respiratory specimens during the acute phase of infection. The lowest concentration of SARS-CoV-2 viral copies this assay can detect is 138 copies/mL. A negative result does not preclude SARS-Cov-2 infection and should not be used as the sole basis for treatment or other patient management decisions. A negative result may occur with  improper specimen collection/handling, submission of specimen other than nasopharyngeal swab, presence of viral mutation(s) within the areas targeted by this assay, and inadequate number of viral copies(<138 copies/mL). A negative result must be combined with clinical observations, patient history,  and epidemiological information. The expected result is Negative.  Fact Sheet for Patients:  EntrepreneurPulse.com.au  Fact Sheet for Healthcare Providers:  IncredibleEmployment.be  This test is no t yet approved or cleared by the [place] FDA and  has been authorized for detection and/or diagnosis of SARS-CoV-2 by FDA under an Emergency Use Authorization (EUA). This EUA will remain  in effect (meaning this test can be used) for the duration of the COVID-19 declaration under Section 564(b)(1) of the Act, 21 U.S.C.section 360bbb-3(b)(1), unless the authorization is terminated  or revoked sooner.       Influenza A by PCR NEGATIVE NEGATIVE Final   Influenza B by PCR NEGATIVE NEGATIVE Final    Comment: (NOTE) The Xpert Xpress SARS-CoV-2/FLU/RSV plus assay is intended as an aid in the diagnosis of influenza from Nasopharyngeal swab specimens and should not be used as  a sole basis for treatment. Nasal washings and aspirates are unacceptable for Xpert Xpress SARS-CoV-2/FLU/RSV testing.  Fact Sheet for Patients: EntrepreneurPulse.com.au  Fact Sheet for Healthcare Providers: IncredibleEmployment.be  This test is not yet approved or cleared by the [place] FDA and has been authorized for detection and/or diagnosis of SARS-CoV-2 by FDA under an Emergency Use Authorization (EUA). This EUA will remain in effect (meaning this test can be used) for the duration of the COVID-19 declaration under Section 564(b)(1) of the Act, 21 U.S.C. section 360bbb-3(b)(1), unless the authorization is terminated or revoked.  Performed at [facility], [address] [place], [place] [address]          Radiology Studies: No results found.      Scheduled Meds: . atorvastatin  10 mg Oral QHS  . Chlorhexidine Gluconate Cloth  6 each Topical Daily  . dapagliflozin propanediol  10 mg Oral Daily  .  enoxaparin (LOVENOX) injection  40 mg Subcutaneous Q24H  . feeding supplement  237 mL Oral BID BM  . furosemide  20 mg Oral Q M,W,F  . melatonin  5 mg Oral QHS  . metoprolol tartrate  25 mg Oral Q6H  . mometasone-formoterol  2 puff Inhalation BID  . multivitamin with minerals  1 tablet Oral Daily  . nicotine  14 mg Transdermal Daily  . pantoprazole  40 mg Oral Daily  . senna  1 tablet Oral BID  . vitamin B-12  1,000 mcg Oral Daily   Continuous Infusions: . methocarbamol (ROBAXIN) IV       LOS: 5 days    Time spent: 30 mins     Wyvonnia Dusky, MD [facility] Pager [phone]  If 7PM-7AM, please contact night-coverage 10/29/2020, 7:17 AM

## 2020-10-29 NOTE — TOC Progression Note (Addendum)
Transition of Care [facility]) - Progression Note    Patient Details  Name: Roberta [name] MRN: [id number] Date of Birth: 01-17-1946  [facility]) CM/SW [place], RN Phone Number: 10/29/2020, 2:10 PM  Clinical Narrative:    The patient is to go to [facility]. Spoke with Magda Paganini at [facility] and they agree to accept the patient as non vaccinated, I called the daughter [name] and left a VM sharing the information and requesting a call back, I explained I called For EMS transport   Expected Discharge Plan:  (TBD) Barriers to Discharge: Continued Medical Work up  Expected Discharge Plan and Services Expected Discharge Plan:  (TBD)   Discharge Planning Services: CM Consult   Living arrangements for the past 2 months: Single Family Home Expected Discharge Date: 10/29/20               DME Arranged: N/A                     Social Determinants of Health (SDOH) Interventions    Readmission Risk Interventions Readmission Risk Prevention Plan 10/25/2020  Transportation Screening Complete  PCP or Specialist Appt within 5-7 Days Complete  Home Care Screening Complete  Medication Review (RN CM) Complete  Some recent data might be hidden

## 2020-10-29 NOTE — TOC Progression Note (Signed)
Transition of Care [facility]) - Progression Note    Patient Details  Name: Roberta [name] MRN: [id number] Date of Birth: Jun 25, 1945  Transition of Care [facility]) CM/SW [place], RN Phone Number: 10/29/2020, 11:19 AM  Clinical Narrative:    Received Notification from [facility] with ins approval [id number] Ref number [id number] next review date 6/7, notified the Physician and [place]  Expected Discharge Plan:  (TBD) Barriers to Discharge: Continued Medical Work up  Expected Discharge Plan and Services Expected Discharge Plan:  (TBD)   Discharge Planning Services: CM Consult   Living arrangements for the past 2 months: Single Family Home                 DME Arranged: N/A                     Social Determinants of Health (SDOH) Interventions    Readmission Risk Interventions Readmission Risk Prevention Plan 10/25/2020  Transportation Screening Complete  PCP or Specialist Appt within 5-7 Days Complete  Home Care Screening Complete  Medication Review (RN CM) Complete  Some recent data might be hidden

## 2020-10-29 NOTE — Progress Notes (Signed)
Physical Therapy Treatment Patient Details Name: Roberta [name] MRN: [id number] DOB: 02-08-1946 Today's Date: 10/29/2020    History of Present Illness Pt admitted for L subtrochentric femur fx secondary to fall and is now s/p L hip hemi. History includes CHF, small lung cancer s/p lobectomy, breast cancer, COPD not on O2 at home.    PT Comments    Pt up to chair with nursing this am.  Participated in exercises as described below.  Stood with mod a x 1 to RW. Stood for marching in place before she stated she needed to void.  Commode obtained and transferred with mod a x 1 and overall poor and unsafe technique sitting before fully turned and needing significant assist to guide her safely to chair.  Bathing on commode.  Stood with +2 assist for pericare where she again transferred back to chair in a generally unsafe manner despite cues to stand fully and not sit.  In recliner with needs met after session.   Follow Up Recommendations  SNF     Equipment Recommendations  None recommended by PT    Recommendations for Other Services       Precautions / Restrictions Precautions Precautions: Posterior Hip;Fall Precaution Booklet Issued: Yes (comment) Precaution Comments: post hip Restrictions Weight Bearing Restrictions: Yes LLE Weight Bearing: Weight bearing as tolerated    Mobility  Bed Mobility               General bed mobility comments: up with nursing just before session    Transfers Overall transfer level: Needs assistance Equipment used: Rolling walker (2 wheeled) Transfers: Sit to/from Stand Sit to Stand: Mod assist            Ambulation/Gait Ambulation/Gait assistance: Mod assist;+2 physical assistance Gait Distance (Feet): 2 Feet Assistive device: Rolling walker (2 wheeled) Gait Pattern/deviations: Step-to pattern     General Gait Details: takes only a few small steps before turning to sit prematurely despite cues   Stairs              Wheelchair Mobility    Modified Rankin (Stroke Patients Only)       Balance Overall balance assessment: Needs assistance Sitting-balance support: No upper extremity supported;Feet supported Sitting balance-Leahy Scale: Good     Standing balance support: Bilateral upper extremity supported;During functional activity Standing balance-Leahy Scale: Poor Standing balance comment: Pt with forward flexed posture, requiring MOD A to maintain standing                            Cognition Arousal/Alertness: Awake/alert Behavior During Therapy: WFL for tasks assessed/performed Overall Cognitive Status: Within Functional Limits for tasks assessed                                        Exercises Other Exercises Other Exercises: supine BLE ex x 10 Other Exercises: assisted with bathing on commode - pt given time to particiapte in care as able    General Comments        Pertinent Vitals/Pain Pain Assessment: Faces Faces Pain Scale: Hurts even more Pain Location: L LE Pain Descriptors / Indicators: Aching;Grimacing;Discomfort Pain Intervention(s): Monitored during session;Limited activity within patient's tolerance;Premedicated before session;Repositioned    Home Living                      Prior Function  PT Goals (current goals can now be found in the care plan section) Progress towards PT goals: Progressing toward goals    Frequency    BID      PT Plan Current plan remains appropriate    Co-evaluation              AM-PAC PT "6 Clicks" Mobility   Outcome Measure  Help needed turning from your back to your side while in a flat bed without using bedrails?: A Little Help needed moving from lying on your back to sitting on the side of a flat bed without using bedrails?: A Lot Help needed moving to and from a bed to a chair (including a wheelchair)?: A Lot Help needed standing up from a chair using your arms  (e.g., wheelchair or bedside chair)?: A Lot Help needed to walk in hospital room?: Total Help needed climbing 3-5 steps with a railing? : Total 6 Click Score: 11    End of Session Equipment Utilized During Treatment: Gait belt Activity Tolerance: Patient limited by pain;Patient limited by fatigue Patient left: in chair;with call bell/phone within reach;with chair alarm set Nurse Communication: Mobility status PT Visit Diagnosis: Unsteadiness on feet (R26.81);Difficulty in walking, not elsewhere classified (R26.2);Muscle weakness (generalized) (M62.81)     Time: 1959-7471 PT Time Calculation (min) (ACUTE ONLY): 23 min  Charges:  $Therapeutic Exercise: 8-22 mins $Therapeutic Activity: 8-22 mins                    Chesley Noon, PTA 10/29/20, 11:13 AM

## 2020-10-29 NOTE — TOC Progression Note (Signed)
Transition of Care [facility]) - Progression Note    Patient Details  Name: Roberta [name] MRN: [id number] Date of Birth: [birth year]  Transition of Care [facility]) CM/SW [place], RN Phone Number: 10/29/2020, 9:40 AM  Clinical Narrative:   Spoke to the patient's daughter [name], Reviewed bed choices with her,  Reviewed the star rating with medicare.com, she chose a bed at [facility], [employer] at [place] approval at [employer]. The patient has only had 2 vaccines and not Booster and does not want the booster,     Expected Discharge Plan:  (TBD) Barriers to Discharge: Continued Medical Work up  Expected Discharge Plan and Services Expected Discharge Plan:  (TBD)   Discharge Planning Services: CM Consult   Living arrangements for the past 2 months: Single Family Home                 DME Arranged: N/A                     Social Determinants of Health (SDOH) Interventions    Readmission Risk Interventions Readmission Risk Prevention Plan 10/25/2020  Transportation Screening Complete  PCP or Specialist Appt within 5-7 Days Complete  Home Care Screening Complete  Medication Review (RN CM) Complete  Some recent data might be hidden

## 2020-10-29 NOTE — TOC Progression Note (Signed)
Transition of Care [facility]) - Progression Note    Patient Details  Name: BRENDAN GRUWELL MRN: [id number] Date of Birth: [birth year]  Transition of Care [facility]) CM/SW [place], RN Phone Number: 10/29/2020, 12:14 PM  Clinical Narrative:   Damaris Schooner with [name] the patient's daughter, She stated that she has an appointment to fill out the paper work at [facility] at [facility], she stated even tho the patient is going to be in isolation they still want the covid vaccine card prior to her coming to the facility she is having difficulty obtaining because the patient lost the card, She is going to ask since they will have her in isolation anyway can they just treat her as non vaccinated, she will call back once they agree that the patient can come   Expected Discharge Plan:  (TBD) Barriers to Discharge: Continued Medical Work up  Expected Discharge Plan and Services Expected Discharge Plan:  (TBD)   Discharge Planning Services: CM Consult   Living arrangements for the past 2 months: Single Family Home Expected Discharge Date: 10/29/20               DME Arranged: N/A                     Social Determinants of Health (SDOH) Interventions    Readmission Risk Interventions Readmission Risk Prevention Plan 10/25/2020  Transportation Screening Complete  PCP or Specialist Appt within 5-7 Days Complete  Home Care Screening Complete  Medication Review (RN CM) Complete  Some recent data might be hidden

## 2020-10-29 NOTE — Progress Notes (Signed)
Report given by phone to receiving RN, Colletta Maryland, at [facility].  Will call for EMS transport once COVID test results and is negative.

## 2020-10-30 LAB — CBC
HCT: 26.8 % — ABNORMAL LOW (ref 36.0–46.0)
Hemoglobin: 8.7 g/dL — ABNORMAL LOW (ref 12.0–15.0)
MCH: 29 pg (ref 26.0–34.0)
MCHC: 32.5 g/dL (ref 30.0–36.0)
MCV: 89.3 fL (ref 80.0–100.0)
Platelets: 276 10*3/uL (ref 150–400)
RBC: 3 MIL/uL — ABNORMAL LOW (ref 3.87–5.11)
RDW: 15.9 % — ABNORMAL HIGH (ref 11.5–15.5)
WBC: 9.4 10*3/uL (ref 4.0–10.5)
nRBC: 0 % (ref 0.0–0.2)

## 2020-10-30 LAB — BASIC METABOLIC PANEL
Anion gap: 8 (ref 5–15)
BUN: 30 mg/dL — ABNORMAL HIGH (ref 8–23)
CO2: 30 mmol/L (ref 22–32)
Calcium: 8.3 mg/dL — ABNORMAL LOW (ref 8.9–10.3)
Chloride: 93 mmol/L — ABNORMAL LOW (ref 98–111)
Creatinine, Ser: 0.84 mg/dL (ref 0.44–1.00)
GFR, Estimated: >60 mL/min (ref >60)
Glucose, Bld: 99 mg/dL (ref 70–99)
Potassium: 3.9 mmol/L (ref 3.5–5.1)
Sodium: 131 mmol/L — ABNORMAL LOW (ref 135–145)

## 2020-10-30 MED ORDER — SODIUM CHLORIDE 1 G PO TABS: 1.0000 g | ORAL_TABLET | Freq: Two times a day (BID) | ORAL | 0 refills | Status: AC

## 2020-10-30 MED ORDER — POLYETHYLENE GLYCOL 3350 17 G PO PACK
17.0000 g | PACK | Freq: Every day | ORAL | Status: DC
  Administered 2020-10-30: 17 g via ORAL
  Filled 2020-10-30: qty 1

## 2020-10-30 MED ORDER — SODIUM CHLORIDE 1 G PO TABS
1.0000 g | ORAL_TABLET | Freq: Two times a day (BID) | ORAL | Status: DC
  Administered 2020-10-30: 1 g via ORAL
  Filled 2020-10-30 (×3): qty 1

## 2020-10-30 NOTE — TOC Transition Note (Addendum)
Transition of Care [facility]) - CM/SW Discharge Note   Patient Details  Name: Roberta [name] MRN: [id number] Date of Birth: [birth year]  Transition of Care [facility]) CM/SW Contact:  Harriet Masson, RN Phone Number: [phone] 10/30/2020, 9:11 AM   Clinical Narrative:    [facility] RN contacted [place] Magda Paganini [phone]) requesting admission time today as results are available for the covid testing. Currently awaiting call back to confirm admission and transport today.  Will update team accordingly. Addendum 1015-TOC RN received a call from [place] confirming pt can be admitted and transported today to [facility]. Medical necessity re-done with today's date, D/C summary faxed and sent in [place] as requested. Spoke with bedside RN Natale Milch) on who to call report at [facility] Colletta Maryland  [phone]). [facility] also called for transport. No other inquires or needs to address at this time.   Daughter had concerns on bedside commode. [employer] will deliver to [facility] as requested. No other needs presented.   RN also spoke with daughter Lesli Albee with update on pt's transport today to [facility].  Final next level of care:  (TBD) Barriers to Discharge: Continued Medical Work up   Patient Goals and CMS Choice Patient states their goals for this hospitalization and ongoing recovery are:: To get stronger and be able to walk   Choice offered to / list presented to : NA  Discharge Placement                       Discharge Plan and Services   Discharge Planning Services: CM Consult            DME Arranged: N/A                    Social Determinants of Health (SDOH) Interventions     Readmission Risk Interventions Readmission Risk Prevention Plan 10/25/2020  Transportation Screening Complete  PCP or Specialist Appt within 5-7 Days Complete  Home Care Screening Complete  Medication Review (RN CM) Complete  Some recent data might be hidden

## 2020-10-30 NOTE — Progress Notes (Signed)
Physical Therapy Treatment Patient Details Name: Roberta [name] MRN: [id number] DOB: 1945-12-05 Today's Date: 10/30/2020    History of Present Illness Pt admitted for L subtrochentric femur fx secondary to fall and is now s/p L hip hemi. History includes CHF, small lung cancer s/p lobectomy, breast cancer, COPD not on O2 at home.    PT Comments    Pt is making gradual progress towards goals and plans to dc to SNF this date. Good endurance with HEP and able to perform short ambulation in room to recliner. Only able to recall 1/3 hip precautions at this time. RW used. Will continue to progress as able.    Follow Up Recommendations  SNF     Equipment Recommendations  None recommended by PT    Recommendations for Other Services       Precautions / Restrictions Precautions Precautions: Posterior Hip;Fall Precaution Booklet Issued: Yes (comment) Precaution Comments: post hip Restrictions Weight Bearing Restrictions: Yes LLE Weight Bearing: Weight bearing as tolerated    Mobility  Bed Mobility Overal bed mobility: Needs Assistance Bed Mobility: Supine to Sit     Supine to sit: Min assist     General bed mobility comments: improved technique with able to initiate mobility with decreased cues. Needs assist to maintain hip precautions. Once seated, able to sit with cga    Transfers Overall transfer level: Needs assistance Equipment used: Rolling walker (2 wheeled) Transfers: Sit to/from Stand Sit to Stand: Mod assist         General transfer comment: cues for hand placement. Upright posture noted. RW used. Once standing, improved posture noted  Ambulation/Gait Ambulation/Gait assistance: Min assist Gait Distance (Feet): 5 Feet Assistive device: Rolling walker (2 wheeled) Gait Pattern/deviations: Step-to pattern     General Gait Details: improved safety noted with pt educated to make sure chair fully behind prior to sitting. Good weight acceptance on surgical LE.  Fatigues quickly   Marine scientist Rankin (Stroke Patients Only)       Balance Overall balance assessment: Needs assistance Sitting-balance support: No upper extremity supported;Feet supported Sitting balance-Leahy Scale: Good Sitting balance - Comments: Good sitting balance while performing seated grooming tasks in recliner   Standing balance support: Bilateral upper extremity supported;During functional activity Standing balance-Leahy Scale: Fair                              Cognition Arousal/Alertness: Awake/alert Behavior During Therapy: WFL for tasks assessed/performed Overall Cognitive Status: Within Functional Limits for tasks assessed                                        Exercises Other Exercises Other Exercises: supine L LE including SLR, hip abd/add, SAQ and LAQ. 15 reps performed with min assist    General Comments        Pertinent Vitals/Pain Pain Assessment: Faces Faces Pain Scale: Hurts a little bit Pain Location: L LE Pain Descriptors / Indicators: Aching;Grimacing;Discomfort Pain Intervention(s): Limited activity within patient's tolerance;Repositioned    Home Living                      Prior Function            PT Goals (current goals can now be  found in the care plan section) Acute Rehab PT Goals Patient Stated Goal: to get stronger PT Goal Formulation: With patient Time For Goal Achievement: 11/10/20 Potential to Achieve Goals: Good Progress towards PT goals: Progressing toward goals    Frequency    BID      PT Plan Current plan remains appropriate    Co-evaluation              AM-PAC PT "6 Clicks" Mobility   Outcome Measure  Help needed turning from your back to your side while in a flat bed without using bedrails?: A Little Help needed moving from lying on your back to sitting on the side of a flat bed without using bedrails?: A  Lot Help needed moving to and from a bed to a chair (including a wheelchair)?: A Lot Help needed standing up from a chair using your arms (e.g., wheelchair or bedside chair)?: A Lot Help needed to walk in hospital room?: A Lot Help needed climbing 3-5 steps with a railing? : Total 6 Click Score: 12    End of Session Equipment Utilized During Treatment: Gait belt Activity Tolerance: Patient limited by pain;Patient limited by fatigue Patient left: in chair;with chair alarm set;with nursing/sitter in room (stool noted on chux, RN in room) Nurse Communication: Mobility status PT Visit Diagnosis: Unsteadiness on feet (R26.81);Difficulty in walking, not elsewhere classified (R26.2);Muscle weakness (generalized) (M62.81)     Time: 3976-7341 PT Time Calculation (min) (ACUTE ONLY): 23 min  Charges:  $Gait Training: 8-22 mins $Therapeutic Exercise: 8-22 mins                     Greggory Stallion, PT, DPT [phone]    Roberta [name] 10/30/2020, 10:54 AM

## 2020-10-30 NOTE — Plan of Care (Signed)
  Problem: Education: Goal: Knowledge of General Education information will improve Description: Including pain rating scale, medication(s)/side effects and non-pharmacologic comfort measures Outcome: Progressing   Problem: Activity: Goal: Risk for activity intolerance will decrease Outcome: Progressing   Problem: Nutrition: Goal: Adequate nutrition will be maintained Outcome: Progressing   Problem: Coping: Goal: Level of anxiety will decrease Outcome: Progressing   

## 2020-11-02 ENCOUNTER — Other Ambulatory Visit: Payer: Medicare HMO

## 2020-11-02 ENCOUNTER — Ambulatory Visit: Payer: Medicare HMO

## 2020-11-09 ENCOUNTER — Ambulatory Visit: Payer: Medicare HMO

## 2020-11-09 ENCOUNTER — Other Ambulatory Visit: Payer: Medicare HMO

## 2020-11-10 ENCOUNTER — Other Ambulatory Visit: Payer: Medicare HMO

## 2020-11-10 ENCOUNTER — Ambulatory Visit: Payer: Medicare HMO | Admitting: Oncology

## 2020-11-20 ENCOUNTER — Other Ambulatory Visit: Payer: Self-pay

## 2020-11-20 ENCOUNTER — Inpatient Hospital Stay
Admission: EM | Admit: 2020-11-20 | Discharge: 2020-11-25 | DRG: 689 | Disposition: A | Discharge status: Skilled Nursing Facility | Payer: Medicare HMO | Attending: Internal Medicine | Admitting: Internal Medicine

## 2020-11-20 ENCOUNTER — Emergency Department: Payer: Medicare HMO

## 2020-11-20 DIAGNOSIS — J449 Chronic obstructive pulmonary disease, unspecified: Secondary | ICD-10-CM | POA: Diagnosis present

## 2020-11-20 DIAGNOSIS — I5022 Chronic systolic (congestive) heart failure: Secondary | ICD-10-CM | POA: Diagnosis not present

## 2020-11-20 DIAGNOSIS — R410 Disorientation, unspecified: Secondary | ICD-10-CM | POA: Diagnosis present

## 2020-11-20 DIAGNOSIS — B961 Klebsiella pneumoniae [K. pneumoniae] as the cause of diseases classified elsewhere: Secondary | ICD-10-CM | POA: Diagnosis present

## 2020-11-20 DIAGNOSIS — Z8249 Family history of ischemic heart disease and other diseases of the circulatory system: Secondary | ICD-10-CM

## 2020-11-20 DIAGNOSIS — Z20822 Contact with and (suspected) exposure to covid-19: Secondary | ICD-10-CM | POA: Diagnosis present

## 2020-11-20 DIAGNOSIS — M549 Dorsalgia, unspecified: Secondary | ICD-10-CM

## 2020-11-20 DIAGNOSIS — Z96642 Presence of left artificial hip joint: Secondary | ICD-10-CM | POA: Diagnosis present

## 2020-11-20 DIAGNOSIS — R197 Diarrhea, unspecified: Secondary | ICD-10-CM

## 2020-11-20 DIAGNOSIS — Z23 Encounter for immunization: Secondary | ICD-10-CM

## 2020-11-20 DIAGNOSIS — M545 Low back pain, unspecified: Secondary | ICD-10-CM | POA: Diagnosis not present

## 2020-11-20 DIAGNOSIS — D519 Vitamin B12 deficiency anemia, unspecified: Secondary | ICD-10-CM | POA: Diagnosis present

## 2020-11-20 DIAGNOSIS — Z515 Encounter for palliative care: Secondary | ICD-10-CM | POA: Diagnosis not present

## 2020-11-20 DIAGNOSIS — Z79899 Other long term (current) drug therapy: Secondary | ICD-10-CM | POA: Diagnosis not present

## 2020-11-20 DIAGNOSIS — E8770 Fluid overload, unspecified: Secondary | ICD-10-CM

## 2020-11-20 DIAGNOSIS — Z66 Do not resuscitate: Secondary | ICD-10-CM | POA: Diagnosis present

## 2020-11-20 DIAGNOSIS — M4854XA Collapsed vertebra, not elsewhere classified, thoracic region, initial encounter for fracture: Secondary | ICD-10-CM | POA: Diagnosis present

## 2020-11-20 DIAGNOSIS — E785 Hyperlipidemia, unspecified: Secondary | ICD-10-CM

## 2020-11-20 DIAGNOSIS — I2510 Atherosclerotic heart disease of native coronary artery without angina pectoris: Secondary | ICD-10-CM | POA: Diagnosis present

## 2020-11-20 DIAGNOSIS — N390 Urinary tract infection, site not specified: Secondary | ICD-10-CM | POA: Diagnosis present

## 2020-11-20 DIAGNOSIS — I5021 Acute systolic (congestive) heart failure: Secondary | ICD-10-CM

## 2020-11-20 DIAGNOSIS — Z9012 Acquired absence of left breast and nipple: Secondary | ICD-10-CM | POA: Diagnosis not present

## 2020-11-20 DIAGNOSIS — Z85118 Personal history of other malignant neoplasm of bronchus and lung: Secondary | ICD-10-CM

## 2020-11-20 DIAGNOSIS — A4150 Gram-negative sepsis, unspecified: Secondary | ICD-10-CM

## 2020-11-20 DIAGNOSIS — Z9221 Personal history of antineoplastic chemotherapy: Secondary | ICD-10-CM | POA: Diagnosis not present

## 2020-11-20 DIAGNOSIS — S32019S Unspecified fracture of first lumbar vertebra, sequela: Secondary | ICD-10-CM | POA: Diagnosis not present

## 2020-11-20 DIAGNOSIS — S72009D Fracture of unspecified part of neck of unspecified femur, subsequent encounter for closed fracture with routine healing: Secondary | ICD-10-CM

## 2020-11-20 DIAGNOSIS — G8929 Other chronic pain: Secondary | ICD-10-CM | POA: Diagnosis present

## 2020-11-20 DIAGNOSIS — I5023 Acute on chronic systolic (congestive) heart failure: Secondary | ICD-10-CM | POA: Diagnosis present

## 2020-11-20 DIAGNOSIS — Z87891 Personal history of nicotine dependence: Secondary | ICD-10-CM | POA: Diagnosis not present

## 2020-11-20 DIAGNOSIS — E538 Deficiency of other specified B group vitamins: Secondary | ICD-10-CM | POA: Diagnosis not present

## 2020-11-20 DIAGNOSIS — Z853 Personal history of malignant neoplasm of breast: Secondary | ICD-10-CM

## 2020-11-20 DIAGNOSIS — R159 Full incontinence of feces: Secondary | ICD-10-CM | POA: Diagnosis present

## 2020-11-20 DIAGNOSIS — W19XXXD Unspecified fall, subsequent encounter: Secondary | ICD-10-CM | POA: Diagnosis present

## 2020-11-20 DIAGNOSIS — I080 Rheumatic disorders of both mitral and aortic valves: Secondary | ICD-10-CM | POA: Diagnosis present

## 2020-11-20 DIAGNOSIS — Z7189 Other specified counseling: Secondary | ICD-10-CM | POA: Diagnosis not present

## 2020-11-20 DIAGNOSIS — N3001 Acute cystitis with hematuria: Principal | ICD-10-CM | POA: Diagnosis present

## 2020-11-20 DIAGNOSIS — J9601 Acute respiratory failure with hypoxia: Secondary | ICD-10-CM | POA: Diagnosis present

## 2020-11-20 DIAGNOSIS — E7800 Pure hypercholesterolemia, unspecified: Secondary | ICD-10-CM | POA: Diagnosis present

## 2020-11-20 DIAGNOSIS — R0902 Hypoxemia: Secondary | ICD-10-CM

## 2020-11-20 DIAGNOSIS — R531 Weakness: Secondary | ICD-10-CM

## 2020-11-20 DIAGNOSIS — Z7982 Long term (current) use of aspirin: Secondary | ICD-10-CM

## 2020-11-20 DIAGNOSIS — I429 Cardiomyopathy, unspecified: Secondary | ICD-10-CM | POA: Diagnosis present

## 2020-11-20 DIAGNOSIS — S32019A Unspecified fracture of first lumbar vertebra, initial encounter for closed fracture: Secondary | ICD-10-CM

## 2020-11-20 LAB — URINALYSIS, COMPLETE (UACMP) WITH MICROSCOPIC
Bilirubin Urine: NEGATIVE
Glucose, UA: 150 mg/dL — AB
Hgb urine dipstick: NEGATIVE
Ketones, ur: NEGATIVE mg/dL
Nitrite: POSITIVE — AB
Protein, ur: NEGATIVE mg/dL
Specific Gravity, Urine: 1.039 — ABNORMAL HIGH (ref 1.005–1.030)
pH: 7 (ref 5.0–8.0)

## 2020-11-20 LAB — COMPREHENSIVE METABOLIC PANEL
ALT: 14 U/L (ref 0–44)
AST: 22 U/L (ref 15–41)
Albumin: 2.6 g/dL — ABNORMAL LOW (ref 3.5–5.0)
Alkaline Phosphatase: 178 U/L — ABNORMAL HIGH (ref 38–126)
Anion gap: 7 (ref 5–15)
BUN: 21 mg/dL (ref 8–23)
CO2: 28 mmol/L (ref 22–32)
Calcium: 8.2 mg/dL — ABNORMAL LOW (ref 8.9–10.3)
Chloride: 100 mmol/L (ref 98–111)
Creatinine, Ser: 0.88 mg/dL (ref 0.44–1.00)
GFR, Estimated: >60 mL/min (ref >60)
Glucose, Bld: 125 mg/dL — ABNORMAL HIGH (ref 70–99)
Potassium: 4.2 mmol/L (ref 3.5–5.1)
Sodium: 135 mmol/L (ref 135–145)
Total Bilirubin: 0.5 mg/dL (ref 0.3–1.2)
Total Protein: 5.6 g/dL — ABNORMAL LOW (ref 6.5–8.1)

## 2020-11-20 LAB — CBC WITH DIFFERENTIAL/PLATELET
Abs Immature Granulocytes: 0.14 10*3/uL — ABNORMAL HIGH (ref 0.00–0.07)
Basophils Absolute: 0 10*3/uL (ref 0.0–0.1)
Basophils Relative: 0 %
Eosinophils Absolute: 0 10*3/uL (ref 0.0–0.5)
Eosinophils Relative: 0 %
HCT: 31.4 % — ABNORMAL LOW (ref 36.0–46.0)
Hemoglobin: 9.6 g/dL — ABNORMAL LOW (ref 12.0–15.0)
Immature Granulocytes: 2 %
Lymphocytes Relative: 11 %
Lymphs Abs: 0.9 10*3/uL (ref 0.7–4.0)
MCH: 29.4 pg (ref 26.0–34.0)
MCHC: 30.6 g/dL (ref 30.0–36.0)
MCV: 96 fL (ref 80.0–100.0)
Monocytes Absolute: 0.7 10*3/uL (ref 0.1–1.0)
Monocytes Relative: 8 %
Neutro Abs: 7 10*3/uL (ref 1.7–7.7)
Neutrophils Relative %: 79 %
Platelets: 443 10*3/uL — ABNORMAL HIGH (ref 150–400)
RBC: 3.27 MIL/uL — ABNORMAL LOW (ref 3.87–5.11)
RDW: 17.7 % — ABNORMAL HIGH (ref 11.5–15.5)
WBC: 8.8 10*3/uL (ref 4.0–10.5)
nRBC: 0 % (ref 0.0–0.2)

## 2020-11-20 LAB — MAGNESIUM: Magnesium: 1.9 mg/dL (ref 1.7–2.4)

## 2020-11-20 MED ORDER — ONDANSETRON HCL 4 MG/2ML IJ SOLN: 4.0000 mg | Freq: Once | INTRAMUSCULAR | Status: AC

## 2020-11-20 MED ORDER — ONDANSETRON HCL 4 MG/2ML IJ SOLN
INTRAMUSCULAR | Status: AC
  Administered 2020-11-20: 4 mg via INTRAVENOUS
  Filled 2020-11-20: qty 2

## 2020-11-20 MED ORDER — FENTANYL CITRATE (PF) 100 MCG/2ML IJ SOLN
INTRAMUSCULAR | Status: AC
  Administered 2020-11-20: 50 ug via INTRAVENOUS
  Filled 2020-11-20: qty 2

## 2020-11-20 MED ORDER — SODIUM CHLORIDE 0.9 % IV SOLN
1.0000 g | Freq: Once | INTRAVENOUS | Status: AC
  Administered 2020-11-20: 1 g via INTRAVENOUS
  Filled 2020-11-20: qty 10

## 2020-11-20 MED ORDER — FENTANYL CITRATE (PF) 100 MCG/2ML IJ SOLN: 50.0000 ug | Freq: Once | INTRAMUSCULAR | Status: AC

## 2020-11-20 MED ORDER — IOHEXOL 300 MG/ML  SOLN
100.0000 mL | Freq: Once | INTRAMUSCULAR | Status: AC | PRN
  Administered 2020-11-20: 100 mL via INTRAVENOUS

## 2020-11-20 MED ORDER — SODIUM CHLORIDE 0.9 % IV BOLUS: 500.0000 mL | Freq: Once | INTRAVENOUS | Status: DC

## 2020-11-20 NOTE — ED Notes (Signed)
Pt given water as requested.

## 2020-11-20 NOTE — ED Triage Notes (Addendum)
Pt in via EMS from home with c/o diarrhea. Pt reports is incontinent of stool which is abnormal for her. EMS states originally called out for "AMS" but pt has been A&Ox4 with them; pt is currently A&Ox4. Family reported tarry stools to EMS. Pt had recent hip replacement. VSS with EMS: 118/70; 98% RA; 154 BG. Pt calmly layig in bed; skin dry; resp reg/unlabored. Pt denies abdominal discomfort, fever, and N/V. Pt reports has been urinating normally; no burning or frequency.

## 2020-11-20 NOTE — ED Notes (Signed)
Adjusted monitor for pt. Denies any other needs.

## 2020-11-20 NOTE — ED Provider Notes (Addendum)
[facility] Emergency Department Provider Note  ____________________________________________   Event Date/Time   First MD Initiated Contact with Patient 11/20/20 1453     (approximate)  I have reviewed the triage vital signs and the nursing notes.   HISTORY  Chief Complaint Diarrhea    HPI Roberta [name] is a 75 y.o. female with CHF EF 25 to 30%, COPD, AAA status post endovascular repair in 2018 with recent hip fracture who comes in with diarrhea.  Patient recently had a hip replacement.  On review of records patient surgery was on 10/26/2020.  Patient was placed on Lovenox for 14 days.  Patient reports yesterday being incontinent of stool which is abnormal for her.  According to family they reported tarry stools to EMS.  Patient states that it started yesterday has happened multiple times, nothing makes better, nothing makes it worse.  She does report some intermittent abdominal pain but denies any fevers, nausea, vomiting, chest pain, shortness of breath.  The pain is mostly on the left lower quadrant.  She denies any longer being on blood thinners.  Denies any new falls.  Reports some chronic back pain but nothing new than normal.     Past Medical History:  Diagnosis Date   AAA (abdominal aortic aneurysm) ([place])    Anemia    Breast cancer ([place]) 2002   left   Cardiomyopathy ([place])    a. 09/2020 Echo: EF 25-30%, glob HK, Gr2 DD, mod reduced RV fxn. Mod dil LA. Mild to mod MR. Mod AS.   Chronic combined systolic and diastolic CHF (congestive heart failure) ([place])    a. 09/2020 Echo: EF 25-30%, glob HK, gr2 DD.   COPD (chronic obstructive pulmonary disease) (HCC)    Coronary artery calcification seen on CT scan    Dyspnea    Headache    High cholesterol    History of stress test    a. 10/2016 Low risk MV. EF 60%.   Mitral regurgitation    Moderate aortic stenosis    Personal history of chemotherapy    Small cell lung cancer ([place])    Tobacco abuse      Patient Active Problem List   Diagnosis Date Noted   Malnutrition of moderate degree 10/25/2020   Closed left subtrochanteric femur fracture (HCC) 29/52/8413   Chronic systolic CHF (congestive heart failure) ([place])    COPD with acute exacerbation ([place]) 24/40/1027   Acute metabolic encephalopathy 25/36/6440   Elevated troponin 09/07/2020   AKI (acute kidney injury) ([place]) 09/07/2020   GERD (gastroesophageal reflux disease) 09/07/2020   Tobacco abuse 09/07/2020   Malignant neoplasm of unspecified part of unspecified bronchus or lung ([place]) 02/22/2020   Cough 02/12/2020   Rib pain on left side 11/29/2019   Compression fracture of T6 vertebra ([place]) 11/05/2019   Weight loss 07/29/2019   Age-related osteoporosis without current pathological fracture 05/21/2019   Compression fracture of T5 vertebra ([place]) 05/06/2019   Port-A-Cath in place 05/06/2019   Closed stable burst fracture of fourth lumbar vertebra ([place]) 06/08/2018   Severe protein-calorie malnutrition ([place]) 10/29/2017   Sepsis ([place]) 04/28/2017   Small cell lung cancer, right upper lobe ([place]) 02/23/2017   Goals of care, counseling/discussion 02/23/2017   Lung mass    AAA (abdominal aortic aneurysm) without rupture ([place]) 11/02/2016   COPD (chronic obstructive pulmonary disease) ([place]) 11/02/2016   Hyperlipidemia 11/02/2016   PAD (peripheral artery disease) ([place]) 11/02/2016    Past Surgical History:  Procedure Laterality Date  ABDOMINAL HYSTERECTOMY     ARTERY BIOPSY Right 12/05/2017   Procedure: BIOPSY TEMPORAL ARTERY;  Surgeon: Margaretha Sheffield, MD;  Location: [facility] ORS;  Service: ENT;  Laterality: Right;   ENDOBRONCHIAL ULTRASOUND N/A 02/19/2017   Procedure: ENDOBRONCHIAL ULTRASOUND;  Surgeon: Flora Lipps, MD;  Location: [facility] ORS;  Service: Cardiopulmonary;  Laterality: N/A;   ENDOVASCULAR REPAIR/STENT GRAFT N/A 12/06/2016   Procedure: Endovascular Repair/Stent Graft;  Surgeon: Katha Cabal, MD;  Location: [facility];  Service: Cardiovascular;  Laterality: N/A;   HIP ARTHROPLASTY Left 10/26/2020   Procedure: ARTHROPLASTY BIPOLAR HIP (HEMIARTHROPLASTY);  Surgeon: Renee Harder, MD;  Location: [facility] ORS;  Service: Orthopedics;  Laterality: Left;   IR FLUORO GUIDE PORT INSERTION RIGHT  03/02/2017   KYPHOPLASTY N/A 06/11/2018   Procedure: KYPHOPLASTY L4 BIOPSY WITH RFA;  Surgeon: Hessie Knows, MD;  Location: [facility] ORS;  Service: Orthopedics;  Laterality: N/A;   KYPHOPLASTY N/A 12/16/2019   Procedure: T6 KYPHOPLASTY;  Surgeon: Hessie Knows, MD;  Location: [facility] ORS;  Service: Orthopedics;  Laterality: N/A;   MASTECTOMY Left 2002   with sentinel node     Prior to Admission medications   Medication Sig Start Date End Date Taking? Authorizing Provider  acetaminophen (TYLENOL) 325 MG tablet Take 650 mg by mouth every 6 (six) hours as needed for mild pain.     [provider]  albuterol (PROVENTIL HFA;VENTOLIN HFA) 108 (90 Base) MCG/ACT inhaler Inhale 2 puffs into the lungs every 6 (six) hours as needed for wheezing or shortness of breath. 03/23/16   Frederich Cha, MD  aspirin EC 81 MG EC tablet Take 1 tablet (81 mg total) by mouth daily. Swallow whole. 09/09/20   Bonnielee Haff, MD  atorvastatin (LIPITOR) 10 MG tablet Take 10 mg by mouth at bedtime.  08/11/16   [provider]  enoxaparin (LOVENOX) 40 MG/0.4ML injection Inject 0.4 mLs (40 mg total) into the skin daily for 14 days. 10/29/20 11/12/20  Wyvonnia Dusky, MD  fluticasone-salmeterol (ADVAIR HFA) [phone] MCG/ACT inhaler Inhale 2 puffs into the lungs 2 (two) times daily.    [provider]  furosemide (LASIX) 20 MG tablet Take 1 tablet (20 mg total) by mouth every Monday, Wednesday, and Friday. 10/18/20   Ralene Muskrat B, MD  ipratropium (ATROVENT) 0.02 % nebulizer solution Take 3 mLs by nebulization in the morning, at noon, in the evening, and at bedtime. 10/07/20 10/07/21  [provider]  metoprolol succinate  (TOPROL-XL) 25 MG 24 hr tablet Take 1 tablet (25 mg total) by mouth daily. 10/18/20 11/17/20  Sidney Ace, MD  nicotine (NICODERM CQ - DOSED IN MG/24 HOURS) 14 mg/24hr patch Place 1 patch (14 mg total) onto the skin daily. 10/18/20   Sidney Ace, MD  omeprazole (PRILOSEC) 20 MG capsule Take 1 capsule (20 mg total) by mouth daily. 09/27/20   Jacquelin Hawking, NP  vitamin B-12 (CYANOCOBALAMIN) 1000 MCG tablet Take 1 tablet (1,000 mcg total) by mouth daily. 09/09/20   Bonnielee Haff, MD    Allergies Patient has no known allergies.  Family History  Problem Relation Age of Onset   CAD Mother    Colon cancer Brother    Breast cancer Neg Hx     Social History Social History   Tobacco Use   Smoking status: Every Day    Packs/day: 1.50    Years: 50.00    Pack years: 75.00    Types: Cigarettes   Smokeless tobacco: Never  Vaping Use  Vaping Use: Never used  Substance Use Topics   Alcohol use: No   Drug use: No      Review of Systems Constitutional: No fever/chills Eyes: No visual changes. ENT: No sore throat. Cardiovascular: Denies chest pain. Respiratory: Denies shortness of breath. Gastrointestinal: Positive abdominal pain, diarrhea Genitourinary: Negative for dysuria. Musculoskeletal: Positive chronic back pain Skin: Negative for rash. Neurological: Negative for headaches, focal weakness or numbness. All other ROS negative ____________________________________________   PHYSICAL EXAM:  VITAL SIGNS: ED Triage Vitals  Enc Vitals Group     BP --      Pulse --      Resp --      Temp 11/20/20 1450 (!) 97.5 F (36.4 C)     Temp Source 11/20/20 1450 Oral     SpO2 --      Weight --      Height --      Head Circumference --      Peak Flow --      Pain Score 11/20/20 1457 0     Pain Loc --      Pain Edu? --      Excl. in [place]? --     Constitutional: Alert and oriented. Well appearing and in no acute distress. Eyes: Conjunctivae are normal. EOMI. Head:  Atraumatic. Nose: No congestion/rhinnorhea. Mouth/Throat: Mucous membranes are moist.   Neck: No stridor. Trachea Midline. FROM Cardiovascular: Normal rate, regular rhythm. Grossly normal heart sounds.  Good peripheral circulation. Respiratory: Normal respiratory effort.  No retractions. Lungs CTAB. Gastrointestinal: Soft but tenderness in the left lower quadrant no distention. No abdominal bruits.  Musculoskeletal: good Strength in her bilateral legs, no numbness or tingling noted.  Well healing scar on the left without any redness  Neurologic:  Normal speech and language. No gross focal neurologic deficits are appreciated.  Skin:  Skin is warm, dry and intact. No rash noted. Psychiatric: Mood and affect are normal. Speech and behavior are normal. GU: Deferred  Rectal: Brown stool, Hemoccult negative, poor rectal tone.  Denies saddle anesthesia  ____________________________________________   LABS (all labs ordered are listed, but only abnormal results are displayed)  Labs Reviewed  CBC WITH DIFFERENTIAL/PLATELET - Abnormal; Notable for the following components:      Result Value   RBC 3.27 (*)    Hemoglobin 9.6 (*)    HCT 31.4 (*)    RDW 17.7 (*)    Platelets 443 (*)    Abs Immature Granulocytes 0.14 (*)    All other components within normal limits  COMPREHENSIVE METABOLIC PANEL - Abnormal; Notable for the following components:   Glucose, Bld 125 (*)    Calcium 8.2 (*)    Total Protein 5.6 (*)    Albumin 2.6 (*)    Alkaline Phosphatase 178 (*)    All other components within normal limits  URINALYSIS, COMPLETE (UACMP) WITH MICROSCOPIC - Abnormal; Notable for the following components:   Color, Urine YELLOW (*)    APPearance HAZY (*)    Specific Gravity, Urine 1.039 (*)    Glucose, UA 150 (*)    Nitrite POSITIVE (*)    Leukocytes,Ua LARGE (*)    Bacteria, UA RARE (*)    All other components within normal limits  BASIC METABOLIC PANEL - Abnormal; Notable for the following  components:   Sodium 134 (*)    Calcium 8.2 (*)    All other components within normal limits  CBC - Abnormal; Notable for the following components:   RBC  3.33 (*)    Hemoglobin 9.9 (*)    HCT 31.9 (*)    RDW 17.4 (*)    Platelets 421 (*)    All other components within normal limits  APTT - Abnormal; Notable for the following components:   aPTT 23 (*)    All other components within normal limits  RESP PANEL BY RT-PCR (FLU A&B, COVID) ARPGX2  CULTURE, BLOOD (ROUTINE X 2)  CULTURE, BLOOD (ROUTINE X 2)  GASTROINTESTINAL PANEL BY PCR, STOOL (REPLACES STOOL CULTURE)  C DIFFICILE QUICK SCREEN W PCR REFLEX    URINE CULTURE  MAGNESIUM  PROTIME-INR   ____________________________________________   ED ECG REPORT I, Vanessa Evart, the attending physician, personally viewed and interpreted this ECG.  Normal sinus rhythm 93, no ST elevation, no T wave versions, normal intervals ____________________________________________  RADIOLOGY   Official radiology report(s): MR THORACIC SPINE WO CONTRAST  Result Date: 11/20/2020 CLINICAL DATA:  Initial evaluation for acute low back pain, fracture. EXAM: MRI THORACIC AND LUMBAR SPINE WITHOUT CONTRAST TECHNIQUE: Multiplanar and multiecho pulse sequences of the thoracic and lumbar spine were obtained without intravenous contrast. COMPARISON:  Prior CT from earlier the same day. FINDINGS: MRI THORACIC SPINE FINDINGS Alignment: Straightening of the normal thoracic kyphosis with underlying sigmoid scoliotic curvature. No listhesis. Vertebrae: Acute to subacute compression fracture seen involving the superior endplate of T5. Associated height loss measures up to 20% without bony retropulsion. This is benign/mechanical in appearance. No other acute or subacute compression fracture within the thoracic spine. Chronic compression deformity with sequelae of prior vertebral augmentation noted at T6. Underlying bone marrow signal intensity within normal limits. No  discrete or worrisome osseous lesions. Cord:  Normal signal and morphology. Paraspinal and other soft tissues: Paraspinous soft tissues demonstrate no acute finding. Extensive post treatment changes noted within the right lung, grossly similar to previous CT. Moderate right with small left layering pleural effusions. Disc levels: Ordinary for age multilevel disc desiccation seen throughout the thoracic spine. No significant disc bulge or focal disc herniation. Scattered facet hypertrophy noted within the lower thoracic spine. No significant spinal stenosis. Mild to moderate bilateral foraminal narrowing noted on the right at T2-3, T3-4, T8-9, T9-10, and T10-11, and on the left at T6-7 through T9-10. Changes are largely due to facet spurring. MRI LUMBAR SPINE FINDINGS Segmentation:  Standard. Alignment:  Levo scoliosis.  3 mm retrolisthesis of L4 on L5. Vertebrae: Acute to subacute compression fracture extends through the inferior endplate of L1 with 40% central height loss without bony retropulsion. This is benign/mechanical in appearance. Additionally, there is an acute to subacute to insufficiency type fracture involving the left sacral ala (series 19, image 17), partially visualized. Chronic L4 compression fracture with up to 70% height loss and 4 mm bony retropulsion. Vertebral body height otherwise maintained. Bone marrow signal intensity within normal limits. No discrete or worrisome osseous lesions. No other abnormal marrow edema. Conus medullaris and cauda equina: Conus extends to the L1 level. Conus and cauda equina appear normal. Paraspinal and other soft tissues: Aortic aneurysm with stent endograft in place, better evaluated on prior CT. Visualized visceral structures otherwise unremarkable. Paraspinous soft tissues demonstrate no other acute finding. Disc levels: L1-2: Disc bulge with disc desiccation. Minimal facet spurring. No spinal stenosis. Foramina remain patent. L2-3: Degenerative intervertebral  disc space narrowing with mild diffuse disc bulge and disc desiccation. Mild facet hypertrophy. No spinal stenosis. Foramina remain patent. L3-4: Diffuse disc bulge with disc desiccation. 4 mm bony retropulsion related to the  chronic L4 compression fracture. Mild to moderate facet hypertrophy. Resultant mild canal with bilateral lateral recess stenosis. Mild bilateral L3 foraminal narrowing. L4-5: Mild disc bulge with disc desiccation. Moderate facet hypertrophy. No significant spinal stenosis. Moderate left greater than right L4 foraminal narrowing. L5-S1: Disc desiccation with mild disc bulge. Mild facet hypertrophy. No more than mild narrowing of the lateral recesses bilaterally, slightly worse on the left. Central canal remains patent. No significant foraminal stenosis. IMPRESSION: MRI THORACIC SPINE IMPRESSION: 1. Acute to subacute compression fracture involving the superior endplate of T5 with up to 20% height loss without bony retropulsion. 2. No other acute abnormality within the thoracic spine. 3. Chronic compression deformity with sequelae of prior vertebral augmentation at T6. 4. Moderate right with small left layering pleural effusions, with extensive post radiation changes within the right lung. MR LUMBAR SPINE IMPRESSION: 1. Acute to subacute compression fracture involving the inferior endplate of L1 with 92% central height loss without bony retropulsion. 2. Acute insufficiency fracture involving the left sacral ala. 3. Chronic L4 compression fracture. 4. Underlying multilevel lumbar spondylosis with resultant mild spinal stenosis at L3-4, with mild to moderate bilateral L3 and L4 foraminal narrowing. Electronically Signed   By: Jeannine Boga M.D.   On: 11/20/2020 22:54   MR LUMBAR SPINE WO CONTRAST  Result Date: 11/20/2020 CLINICAL DATA:  Initial evaluation for acute low back pain, fracture. EXAM: MRI THORACIC AND LUMBAR SPINE WITHOUT CONTRAST TECHNIQUE: Multiplanar and multiecho pulse  sequences of the thoracic and lumbar spine were obtained without intravenous contrast. COMPARISON:  Prior CT from earlier the same day. FINDINGS: MRI THORACIC SPINE FINDINGS Alignment: Straightening of the normal thoracic kyphosis with underlying sigmoid scoliotic curvature. No listhesis. Vertebrae: Acute to subacute compression fracture seen involving the superior endplate of T5. Associated height loss measures up to 20% without bony retropulsion. This is benign/mechanical in appearance. No other acute or subacute compression fracture within the thoracic spine. Chronic compression deformity with sequelae of prior vertebral augmentation noted at T6. Underlying bone marrow signal intensity within normal limits. No discrete or worrisome osseous lesions. Cord:  Normal signal and morphology. Paraspinal and other soft tissues: Paraspinous soft tissues demonstrate no acute finding. Extensive post treatment changes noted within the right lung, grossly similar to previous CT. Moderate right with small left layering pleural effusions. Disc levels: Ordinary for age multilevel disc desiccation seen throughout the thoracic spine. No significant disc bulge or focal disc herniation. Scattered facet hypertrophy noted within the lower thoracic spine. No significant spinal stenosis. Mild to moderate bilateral foraminal narrowing noted on the right at T2-3, T3-4, T8-9, T9-10, and T10-11, and on the left at T6-7 through T9-10. Changes are largely due to facet spurring. MRI LUMBAR SPINE FINDINGS Segmentation:  Standard. Alignment:  Levo scoliosis.  3 mm retrolisthesis of L4 on L5. Vertebrae: Acute to subacute compression fracture extends through the inferior endplate of L1 with 42% central height loss without bony retropulsion. This is benign/mechanical in appearance. Additionally, there is an acute to subacute to insufficiency type fracture involving the left sacral ala (series 19, image 17), partially visualized. Chronic L4  compression fracture with up to 70% height loss and 4 mm bony retropulsion. Vertebral body height otherwise maintained. Bone marrow signal intensity within normal limits. No discrete or worrisome osseous lesions. No other abnormal marrow edema. Conus medullaris and cauda equina: Conus extends to the L1 level. Conus and cauda equina appear normal. Paraspinal and other soft tissues: Aortic aneurysm with stent endograft in place, better evaluated  on prior CT. Visualized visceral structures otherwise unremarkable. Paraspinous soft tissues demonstrate no other acute finding. Disc levels: L1-2: Disc bulge with disc desiccation. Minimal facet spurring. No spinal stenosis. Foramina remain patent. L2-3: Degenerative intervertebral disc space narrowing with mild diffuse disc bulge and disc desiccation. Mild facet hypertrophy. No spinal stenosis. Foramina remain patent. L3-4: Diffuse disc bulge with disc desiccation. 4 mm bony retropulsion related to the chronic L4 compression fracture. Mild to moderate facet hypertrophy. Resultant mild canal with bilateral lateral recess stenosis. Mild bilateral L3 foraminal narrowing. L4-5: Mild disc bulge with disc desiccation. Moderate facet hypertrophy. No significant spinal stenosis. Moderate left greater than right L4 foraminal narrowing. L5-S1: Disc desiccation with mild disc bulge. Mild facet hypertrophy. No more than mild narrowing of the lateral recesses bilaterally, slightly worse on the left. Central canal remains patent. No significant foraminal stenosis. IMPRESSION: MRI THORACIC SPINE IMPRESSION: 1. Acute to subacute compression fracture involving the superior endplate of T5 with up to 20% height loss without bony retropulsion. 2. No other acute abnormality within the thoracic spine. 3. Chronic compression deformity with sequelae of prior vertebral augmentation at T6. 4. Moderate right with small left layering pleural effusions, with extensive post radiation changes within the  right lung. MR LUMBAR SPINE IMPRESSION: 1. Acute to subacute compression fracture involving the inferior endplate of L1 with 27% central height loss without bony retropulsion. 2. Acute insufficiency fracture involving the left sacral ala. 3. Chronic L4 compression fracture. 4. Underlying multilevel lumbar spondylosis with resultant mild spinal stenosis at L3-4, with mild to moderate bilateral L3 and L4 foraminal narrowing. Electronically Signed   By: Jeannine Boga M.D.   On: 11/20/2020 22:54   CT ABDOMEN PELVIS W CONTRAST  Result Date: 11/20/2020 CLINICAL DATA:  Suspected abdominal infection or abscess in a 75 year old female. EXAM: CT ABDOMEN AND PELVIS WITH CONTRAST TECHNIQUE: Multidetector CT imaging of the abdomen and pelvis was performed using the standard protocol following bolus administration of intravenous contrast. CONTRAST:  174mL OMNIPAQUE IOHEXOL 300 MG/ML  SOLN COMPARISON:  May 04, 2020. FINDINGS: Lower chest: Small bilateral pleural effusions. Pulmonary emphysema. Hepatobiliary: Hepatic steatosis. No focal hepatic lesion. The portal vein is patent. No pericholecystic stranding. No biliary duct dilation. Pancreas: Mild pancreatic atrophy. No focal lesion, inflammation or ductal dilation. Spleen: Normal spleen. Adrenals/Urinary Tract: Normal adrenal glands. Symmetric renal enhancement. No hydronephrosis. Urinary bladder with smooth contours. Stomach/Bowel: No acute gastrointestinal process. Small bowel is normal caliber. The appendix is normal. Stool fills much of the colon. No pericolonic inflammation. Colonic diverticulosis and diverticular disease mainly in the sigmoid colon where this is moderate to marked. No free air. Vascular/Lymphatic: Signs of aorto bi-iliac endograft placement. Similar appearance of the graft and the endo sac which measures approximately 3.3 x 2.9 cm previously 3.5 x 3.1 cm greatest axial dimension. Abdominal vasculature is patent. Smooth contour of the IVC.  There is no gastrohepatic or hepatoduodenal ligament lymphadenopathy. No retroperitoneal or mesenteric lymphadenopathy. No pelvic sidewall lymphadenopathy. Reproductive: Unremarkable, no adnexal mass. Other: No free air. Musculoskeletal: Osteopenia. Post LEFT hip arthroplasty. Fluid collection in the superficial soft tissues overlying the LEFT gluteal region and superior LEFT hip measuring 7.0 x 6.6 cm. Fracture of the LEFT inferior pubic ramus with callus formation compatible with subacute or chronic fracture. Signs of cement augmentation at the L4 level with similar loss of height when compared to previous imaging. New loss of height of L1 along the inferior endplate when compared to imaging from December of 2021, see dedicated lumbar  spine obtained on November 21, 2019 as well and reported separately. Linear sclerosis of the LEFT sacral ala new from previous imaging. IMPRESSION: 1. No acute intra-abdominal or pelvic findings. 2. Fluid collection in the superficial soft tissues overlying the LEFT gluteal region and superior LEFT hip measuring 7.0 x 6.6 cm. This may represent a postoperative seroma, early infected collection is also considered. Correlate with any signs of infection in this area, this is immediately deep to the skin surface. 3. Fracture of the LEFT inferior pubic ramus with callus formation compatible with subacute fracture. 4. Linear sclerosis in the LEFT sacral ala is new compared to prior imaging likely reflecting an insufficiency fracture of the LEFT sacral ala. 5. New loss of height at L1 compatible with interval fracture of the inferior endplate of L1 since May of 2022. 6. Hepatic steatosis. 7. Small bilateral pleural effusions. 8. Similar appearance of aorto bi-iliac endograft. 9. Emphysema and aortic atherosclerosis. Aortic Atherosclerosis (ICD10-I70.0) and Emphysema (ICD10-J43.9). Electronically Signed   By: Zetta Bills M.D.   On: 11/20/2020 16:41   CT L-SPINE NO CHARGE  Result Date:  11/20/2020 CLINICAL DATA:  Back pain. EXAM: CT LUMBAR SPINE WITH CONTRAST TECHNIQUE: Technique: Multiplanar CT images of the lumbar spine were reconstructed from contemporary CT of the Abdomen and Pelvis. CONTRAST:  100 mL Omnipaque 300 given for concurrent CT of the abdomen and pelvis COMPARISON:  CT abdomen and pelvis 05/04/2020. CTA chest 10/12/2020. FINDINGS: Segmentation: 5 lumbar type vertebrae. Alignment: Mild lumbar levoscoliosis. Minimal retrolisthesis of L4 on L5. Vertebrae: Chronic L4 compression fracture with severe anterior vertebral body height loss, previously augmented. L1 inferior endplate compression fracture with 25% vertebral body height loss and mild sclerosis along the inferior endplate, new from last month's chest CTA. No suspicious osseous lesion. Paraspinal and other soft tissues: No significant paraspinal soft tissue edema associated with the L1 fracture. Intra-abdominal and pelvic contents reported separately. Disc levels: At L3-4, disc bulging, mild retropulsion of the L4 superior endplate, and facet hypertrophy result in mild spinal stenosis. At L4-5, disc bulging and facet and ligamentum flavum hypertrophy result in borderline spinal stenosis. There is mild-to-moderate neural foraminal stenosis from L2-3 to L5-S1. IMPRESSION: 1. Acute or subacute L1 compression fracture with 25% height loss. 2. Chronic L4 compression fracture. 3. Mild spinal stenosis at L3-4. 4. Mild-to-moderate multilevel neural foraminal stenosis. Electronically Signed   By: Logan Bores M.D.   On: 11/20/2020 16:44    ____________________________________________   PROCEDURES  Procedure(s) performed (including Critical Care):  .1-3 Lead EKG Interpretation  Date/Time: 11/21/2020 8:46 AM Performed by: Vanessa [place], MD Authorized by: Vanessa At[place], MD     Interpretation: abnormal     ECG rate:  90s   ECG rate assessment: normal     Rhythm: sinus rhythm     Ectopy: none     Conduction: normal      ____________________________________________   INITIAL IMPRESSION / ASSESSMENT AND PLAN / ED COURSE  NELSIE DOMINO was evaluated in Emergency Department on 11/20/2020 for the symptoms described in the history of present illness. She was evaluated in the context of the global COVID-19 pandemic, which necessitated consideration that the patient might be at risk for infection with the SARS-CoV-2 virus that causes COVID-19. Institutional protocols and algorithms that pertain to the evaluation of patients at risk for COVID-19 are in a state of rapid change based on information released by regulatory bodies including the CDC and federal and state organizations. These policies and algorithms were  followed during the patient's care in the ED.     Patient is a 75 year old who comes in with rectal incontinence.  Labs ordered evaluate for [place] , AKI.  Patient is tender in her abdomen on the left side so we will get CT to make sure no evidence of significant colitis, diverticulitis, abscess.  Her rectal tone did seem weak but she has no new back tenderness, no weakness or tingling in her legs but will get the CT scan will do some lumbar reformats to make sure there is no new fracture from her fall few weeks ago.  We will also get post void bladder scan to make sure there is no urinary retention.  CT possible post op infection recommend correlate clinically but at this time no redness or warmth. Does not appear infected most likely post op seroma.   CT does show subacute L1 fracture and given pt doesn't have great rectal stool and new stool incont. Got MRI without evidence of cord compression.  Suspect fracture is from fall 3 weeks ago- no sig new back pain   UA does look like uti   11:06 PM Discussed with daughter- suppose to see Dr. Melrose Nakayama for neurological issues for b12 issues and white matter changes.  They are concerned about severe weakness and inability to get her around.  Reports stool and  urinating on her self.   Will admit to hospital         ____________________________________________   FINAL CLINICAL IMPRESSION(S) / ED DIAGNOSES   Final diagnoses:  Back pain  Urinary tract infection without hematuria, site unspecified  Weakness  Closed fracture of first lumbar vertebra, unspecified fracture morphology, initial encounter ([place])      MEDICATIONS GIVEN DURING THIS VISIT:  Medications  aspirin EC tablet 81 mg (has no administration in time range)  atorvastatin (LIPITOR) tablet 10 mg (10 mg Oral Given 11/21/20 0230)  furosemide (LASIX) tablet 20 mg (has no administration in time range)  metoprolol succinate (TOPROL-XL) 24 hr tablet 25 mg (has no administration in time range)  nicotine (NICODERM CQ - dosed in mg/24 hours) patch 14 mg (has no administration in time range)  pantoprazole (PROTONIX) EC tablet 40 mg (has no administration in time range)  enoxaparin (LOVENOX) injection 40 mg (40 mg Subcutaneous Given 11/21/20 0235)  vitamin B-12 (CYANOCOBALAMIN) tablet 1,000 mcg (has no administration in time range)  albuterol (PROVENTIL) (2.5 MG/3ML) 0.083% nebulizer solution 2.5 mg (2.5 mg Inhalation Given 11/21/20 0735)  mometasone-formoterol (DULERA) 200-5 MCG/ACT inhaler 2 puff (has no administration in time range)  0.9 %  sodium chloride infusion ( Intravenous Rate/Dose Change 11/21/20 0745)  acetaminophen (TYLENOL) tablet 650 mg (650 mg Oral Given 11/21/20 0230)    Or  acetaminophen (TYLENOL) suppository 650 mg ( Rectal See Alternative 11/21/20 0230)  traZODone (DESYREL) tablet 25 mg (25 mg Oral Given 11/21/20 0230)  magnesium hydroxide (MILK OF MAGNESIA) suspension 30 mL (has no administration in time range)  ondansetron (ZOFRAN) tablet 4 mg (has no administration in time range)    Or  ondansetron (ZOFRAN) injection 4 mg (has no administration in time range)  loperamide (IMODIUM) capsule 2 mg (has no administration in time range)  cefTRIAXone (ROCEPHIN) 1 g in  sodium chloride 0.9 % 100 mL IVPB (has no administration in time range)  morphine 2 MG/ML injection 2 mg (has no administration in time range)  ipratropium (ATROVENT) nebulizer solution 0.5 mg (has no administration in time range)  iohexol (OMNIPAQUE) 300 MG/ML solution 100  mL (100 mLs Intravenous Contrast Given 11/20/20 1557)  fentaNYL (SUBLIMAZE) injection 50 mcg (50 mcg Intravenous Given 11/20/20 2025)  ondansetron (ZOFRAN) injection 4 mg (4 mg Intravenous Given 11/20/20 2026)  cefTRIAXone (ROCEPHIN) 1 g in sodium chloride 0.9 % 100 mL IVPB (0 g Intravenous Stopped 11/21/20 0007)     ED Discharge Orders     None        Note:  This document was prepared using Dragon voice recognition software and may include unintentional dictation errors.    Vanessa Auglaize, MD 11/21/20 2761    Vanessa Wright, MD 11/21/20 [phone]

## 2020-11-20 NOTE — ED Notes (Signed)
Sent secure chat to [place] that pt's daughter Lesli Albee [phone] would like to talk with her about pt. Daughter states is having trouble caring for pt at home; may need social work consult.

## 2020-11-20 NOTE — ED Notes (Signed)
Pt leaving for MRI.  

## 2020-11-20 NOTE — ED Notes (Signed)
NT at bedside.  

## 2020-11-20 NOTE — ED Notes (Signed)
EDP [facility] at bedside. Pt reports 3-4 diarrhea episodes since yesterday. Pt now stating to [place] that sometimes her abdomen bothers her. Pt reports BM's have been brown; denies them being tarry-colored like her family stated.

## 2020-11-20 NOTE — ED Notes (Signed)
Pt leaving for imaging.

## 2020-11-20 NOTE — ED Notes (Signed)
Pt given another warm blanket. Bladder scan only noting 10cc of urine. EDP [facility] notified. Stretcher locked low. Rails up. Call bell within reach.

## 2020-11-20 NOTE — ED Notes (Addendum)
[facility] completing rectal exam; this RN at bedside.

## 2020-11-20 NOTE — ED Notes (Signed)
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

## 2020-11-21 ENCOUNTER — Inpatient Hospital Stay: Payer: Medicare HMO

## 2020-11-21 DIAGNOSIS — J9601 Acute respiratory failure with hypoxia: Secondary | ICD-10-CM

## 2020-11-21 DIAGNOSIS — I5021 Acute systolic (congestive) heart failure: Secondary | ICD-10-CM

## 2020-11-21 DIAGNOSIS — N3000 Acute cystitis without hematuria: Secondary | ICD-10-CM

## 2020-11-21 DIAGNOSIS — R197 Diarrhea, unspecified: Secondary | ICD-10-CM

## 2020-11-21 DIAGNOSIS — D519 Vitamin B12 deficiency anemia, unspecified: Secondary | ICD-10-CM

## 2020-11-21 DIAGNOSIS — E8770 Fluid overload, unspecified: Secondary | ICD-10-CM

## 2020-11-21 DIAGNOSIS — S32019A Unspecified fracture of first lumbar vertebra, initial encounter for closed fracture: Secondary | ICD-10-CM

## 2020-11-21 LAB — CBC
HCT: 31.9 % — ABNORMAL LOW (ref 36.0–46.0)
Hemoglobin: 9.9 g/dL — ABNORMAL LOW (ref 12.0–15.0)
MCH: 29.7 pg (ref 26.0–34.0)
MCHC: 31 g/dL (ref 30.0–36.0)
MCV: 95.8 fL (ref 80.0–100.0)
Platelets: 421 10*3/uL — ABNORMAL HIGH (ref 150–400)
RBC: 3.33 MIL/uL — ABNORMAL LOW (ref 3.87–5.11)
RDW: 17.4 % — ABNORMAL HIGH (ref 11.5–15.5)
WBC: 9 10*3/uL (ref 4.0–10.5)
nRBC: 0 % (ref 0.0–0.2)

## 2020-11-21 LAB — BASIC METABOLIC PANEL
Anion gap: 9 (ref 5–15)
BUN: 16 mg/dL (ref 8–23)
CO2: 27 mmol/L (ref 22–32)
Calcium: 8.2 mg/dL — ABNORMAL LOW (ref 8.9–10.3)
Chloride: 98 mmol/L (ref 98–111)
Creatinine, Ser: 0.82 mg/dL (ref 0.44–1.00)
GFR, Estimated: >60 mL/min (ref >60)
Glucose, Bld: 88 mg/dL (ref 70–99)
Potassium: 3.6 mmol/L (ref 3.5–5.1)
Sodium: 134 mmol/L — ABNORMAL LOW (ref 135–145)

## 2020-11-21 LAB — PROTIME-INR
INR: 1 (ref 0.8–1.2)
Prothrombin Time: 13.1 seconds (ref 11.4–15.2)

## 2020-11-21 LAB — RESP PANEL BY RT-PCR (FLU A&B, COVID) ARPGX2
Influenza A by PCR: NEGATIVE
Influenza B by PCR: NEGATIVE
SARS Coronavirus 2 by RT PCR: NEGATIVE

## 2020-11-21 LAB — APTT: aPTT: 23 seconds — ABNORMAL LOW (ref 24–36)

## 2020-11-21 MED ORDER — IPRATROPIUM BROMIDE 0.02 % IN SOLN: 3.0000 mL | Freq: Four times a day (QID) | RESPIRATORY_TRACT | Status: DC

## 2020-11-21 MED ORDER — TRAZODONE HCL 50 MG PO TABS
25.0000 mg | ORAL_TABLET | Freq: Every evening | ORAL | Status: DC | PRN
  Administered 2020-11-21 – 2020-11-22 (×3): 25 mg via ORAL
  Filled 2020-11-21 (×3): qty 1

## 2020-11-21 MED ORDER — IPRATROPIUM BROMIDE 0.02 % IN SOLN: 0.5000 mg | Freq: Two times a day (BID) | RESPIRATORY_TRACT | Status: DC

## 2020-11-21 MED ORDER — ACETAMINOPHEN 650 MG RE SUPP: 650.0000 mg | Freq: Four times a day (QID) | RECTAL | Status: DC | PRN

## 2020-11-21 MED ORDER — FUROSEMIDE 10 MG/ML IJ SOLN
40.0000 mg | Freq: Once | INTRAMUSCULAR | Status: AC
  Administered 2020-11-21: 15:00:00 40 mg via INTRAVENOUS
  Filled 2020-11-21: qty 4

## 2020-11-21 MED ORDER — ZINC OXIDE 40 % EX OINT
TOPICAL_OINTMENT | CUTANEOUS | Status: DC | PRN
  Filled 2020-11-21: qty 113

## 2020-11-21 MED ORDER — MAGNESIUM HYDROXIDE 400 MG/5ML PO SUSP
30.0000 mL | Freq: Every day | ORAL | Status: DC | PRN
  Filled 2020-11-21: qty 30

## 2020-11-21 MED ORDER — ACETAMINOPHEN 325 MG PO TABS
650.0000 mg | ORAL_TABLET | Freq: Four times a day (QID) | ORAL | Status: DC | PRN
  Administered 2020-11-21 – 2020-11-25 (×8): 650 mg via ORAL
  Filled 2020-11-21 (×10): qty 2

## 2020-11-21 MED ORDER — MORPHINE SULFATE (PF) 2 MG/ML IV SOLN
2.0000 mg | INTRAVENOUS | Status: DC | PRN
  Administered 2020-11-23 (×2): 2 mg via INTRAVENOUS
  Filled 2020-11-21 (×3): qty 1

## 2020-11-21 MED ORDER — COVID-19 MRNA VAC-TRIS(PFIZER) 30 MCG/0.3ML IM SUSP
0.3000 mL | Freq: Once | INTRAMUSCULAR | Status: AC
  Administered 2020-11-21: 0.3 mL via INTRAMUSCULAR
  Filled 2020-11-21: qty 0.3

## 2020-11-21 MED ORDER — PANTOPRAZOLE SODIUM 40 MG PO TBEC
40.0000 mg | DELAYED_RELEASE_TABLET | Freq: Every day | ORAL | Status: DC
  Administered 2020-11-21 – 2020-11-25 (×5): 40 mg via ORAL
  Filled 2020-11-21 (×5): qty 1

## 2020-11-21 MED ORDER — IPRATROPIUM-ALBUTEROL 0.5-2.5 (3) MG/3ML IN SOLN
3.0000 mL | Freq: Three times a day (TID) | RESPIRATORY_TRACT | Status: DC
  Administered 2020-11-21 – 2020-11-25 (×11): 3 mL via RESPIRATORY_TRACT
  Filled 2020-11-21 (×11): qty 3

## 2020-11-21 MED ORDER — ALBUTEROL SULFATE (2.5 MG/3ML) 0.083% IN NEBU
2.5000 mg | INHALATION_SOLUTION | Freq: Four times a day (QID) | RESPIRATORY_TRACT | Status: DC | PRN
Start: 2020-11-20 — End: 2020-11-21
  Administered 2020-11-21: 08:00:00 2.5 mg via RESPIRATORY_TRACT
  Filled 2020-11-21: qty 3

## 2020-11-21 MED ORDER — ATORVASTATIN CALCIUM 20 MG PO TABS
10.0000 mg | ORAL_TABLET | Freq: Every day | ORAL | Status: DC
  Administered 2020-11-21 – 2020-11-24 (×5): 10 mg via ORAL
  Filled 2020-11-21 (×5): qty 1

## 2020-11-21 MED ORDER — SODIUM CHLORIDE 0.9 % IV SOLN
1.0000 g | INTRAVENOUS | Status: DC
  Administered 2020-11-21 – 2020-11-22 (×2): 1 g via INTRAVENOUS
  Filled 2020-11-21: qty 1
  Filled 2020-11-21 (×2): qty 10

## 2020-11-21 MED ORDER — ENOXAPARIN SODIUM 40 MG/0.4ML IJ SOSY
40.0000 mg | PREFILLED_SYRINGE | INTRAMUSCULAR | Status: DC
  Administered 2020-11-21 – 2020-11-24 (×6): 40 mg via SUBCUTANEOUS
  Filled 2020-11-21 (×4): qty 0.4

## 2020-11-21 MED ORDER — VITAMIN B-12 1000 MCG PO TABS
1000.0000 ug | ORAL_TABLET | Freq: Every day | ORAL | Status: DC
  Administered 2020-11-21 – 2020-11-25 (×5): 1000 ug via ORAL
  Filled 2020-11-21 (×5): qty 1

## 2020-11-21 MED ORDER — SODIUM CHLORIDE 0.9 % IV SOLN: 1.0000 g | INTRAVENOUS | Status: DC

## 2020-11-21 MED ORDER — CALCITONIN (SALMON) 200 UNIT/ACT NA SOLN
1.0000 | Freq: Every day | NASAL | Status: DC
  Administered 2020-11-21 – 2020-11-24 (×4): 1 via NASAL
  Filled 2020-11-21: qty 3.7

## 2020-11-21 MED ORDER — ONDANSETRON HCL 4 MG PO TABS: 4.0000 mg | ORAL_TABLET | Freq: Four times a day (QID) | ORAL | Status: DC | PRN

## 2020-11-21 MED ORDER — ENOXAPARIN SODIUM 40 MG/0.4ML IJ SOSY: 40.0000 mg | PREFILLED_SYRINGE | INTRAMUSCULAR | Status: DC

## 2020-11-21 MED ORDER — FUROSEMIDE 20 MG PO TABS
20.0000 mg | ORAL_TABLET | Freq: Every day | ORAL | Status: DC
  Administered 2020-11-22: 20 mg via ORAL
  Filled 2020-11-21: qty 1

## 2020-11-21 MED ORDER — MOMETASONE FURO-FORMOTEROL FUM 200-5 MCG/ACT IN AERO
2.0000 | INHALATION_SPRAY | Freq: Two times a day (BID) | RESPIRATORY_TRACT | Status: DC
  Administered 2020-11-21 – 2020-11-25 (×9): 2 via RESPIRATORY_TRACT
  Filled 2020-11-21: qty 8.8

## 2020-11-21 MED ORDER — METOPROLOL SUCCINATE ER 50 MG PO TB24
25.0000 mg | ORAL_TABLET | Freq: Every day | ORAL | Status: DC
  Administered 2020-11-21 – 2020-11-22 (×2): 25 mg via ORAL
  Filled 2020-11-21 (×2): qty 1

## 2020-11-21 MED ORDER — ONDANSETRON HCL 4 MG/2ML IJ SOLN: 4.0000 mg | Freq: Four times a day (QID) | INTRAMUSCULAR | Status: DC | PRN

## 2020-11-21 MED ORDER — ALBUTEROL SULFATE (2.5 MG/3ML) 0.083% IN NEBU
2.5000 mg | INHALATION_SOLUTION | Freq: Three times a day (TID) | RESPIRATORY_TRACT | Status: DC
Start: 2020-11-21 — End: 2020-11-21
  Administered 2020-11-21: 15:00:00 2.5 mg via RESPIRATORY_TRACT

## 2020-11-21 MED ORDER — LOPERAMIDE HCL 2 MG PO CAPS: 2.0000 mg | ORAL_CAPSULE | ORAL | Status: DC | PRN

## 2020-11-21 MED ORDER — ASPIRIN EC 81 MG PO TBEC
81.0000 mg | DELAYED_RELEASE_TABLET | Freq: Every day | ORAL | Status: DC
  Administered 2020-11-21 – 2020-11-25 (×5): 81 mg via ORAL
  Filled 2020-11-21 (×5): qty 1

## 2020-11-21 MED ORDER — NICOTINE 14 MG/24HR TD PT24
14.0000 mg | MEDICATED_PATCH | Freq: Every day | TRANSDERMAL | Status: DC
  Administered 2020-11-21 – 2020-11-25 (×5): 14 mg via TRANSDERMAL
  Filled 2020-11-21 (×5): qty 1

## 2020-11-21 MED ORDER — SODIUM CHLORIDE 0.9 % IV SOLN: INTRAVENOUS | Status: DC

## 2020-11-21 MED ORDER — FUROSEMIDE 20 MG PO TABS: 20.0000 mg | ORAL_TABLET | ORAL | Status: DC

## 2020-11-21 MED ORDER — ALBUTEROL SULFATE (2.5 MG/3ML) 0.083% IN NEBU
INHALATION_SOLUTION | RESPIRATORY_TRACT | Status: AC
  Filled 2020-11-21: qty 3

## 2020-11-21 NOTE — Evaluation (Signed)
Physical Therapy Evaluation Patient Details Name: Roberta [name] MRN: [id number] DOB: December 08, 1945 Today's Date: 11/21/2020   History of Present Illness  Patient is a 75 y.o. Caucasian female with medical history significant for combined systolic diastolic CHF, COPD, coronary artery disease, who recently had a left hip fracture for which she was admitted here on 5/29 and underwent left hemiarthroplasty then was discharged to rehab on 6/5.  She has been there till few days ago when she was discharged home from rehab.  She has been experiencing foul-smelling urine and urinary and stool incontinence with diarrhea.   Clinical Impression  Patient reports she was recently discharged from rehab to home and she was ambulating without physical assistance with use of rolling walker. Patient currently required very minimal physical assistance for mobility, and ambulated in the room with rolling walker with Min guard progressing to supervision. Limited overall activity tolerance. No family in the room at the time of evaluation, however noted several notes in the chart that family has concerns about her weakness and inability to get around. Recommend PT follow up while in the hospital to maximize independence and decrease caregiver burden. Discharge recommendation is HHPT vs SNF depending on support from family.     Follow Up Recommendations Supervision for mobility/OOB;Other (comment) (per chart review, patient's family concerned about not being able to care for patient at home. consider SNF placement if family is unable to provide PRN assistance. otherwise,  patient could go home with HHPT and family support)    Equipment Recommendations  None recommended by PT    Recommendations for Other Services       Precautions / Restrictions Precautions Precautions: Posterior Hip;Fall Precaution Comments: per op note from 5/31, patient is WBAT with posterior hip precautions x 3 months for left  hip Restrictions Weight Bearing Restrictions: Yes LLE Weight Bearing: Weight bearing as tolerated      Mobility  Bed Mobility Overal bed mobility: Needs Assistance Bed Mobility: Supine to Sit;Sit to Supine     Supine to sit: Modified independent (Device/Increase time) Sit to supine: Supervision   General bed mobility comments: increased time required to complete tasks    Transfers Overall transfer level: Needs assistance Equipment used: Rolling walker (2 wheeled) Transfers: Sit to/from Stand Sit to Stand: Min guard         General transfer comment: Min guard for safety  Ambulation/Gait Ambulation/Gait assistance: Min guard;Supervision Gait Distance (Feet): 15 Feet Assistive device: Rolling walker (2 wheeled) Gait Pattern/deviations: Step-to pattern Gait velocity: decreased   General Gait Details: no loss of balance with ambulation efforts. patient is fatigued with activity and declined ambulating further at this time. Min guard initially progressing to supervision with increased walking distance. occasional cues for keeping rolling walker closer to base of support  Stairs            Wheelchair Mobility    Modified Rankin (Stroke Patients Only)       Balance Overall balance assessment: Needs assistance Sitting-balance support: No upper extremity supported;Feet supported Sitting balance-Leahy Scale: Good     Standing balance support: Bilateral upper extremity supported Standing balance-Leahy Scale: Fair Standing balance comment: using rolling walker for UE support in standing                             Pertinent Vitals/Pain Pain Assessment: No/denies pain    Home Living Family/patient expects to be discharged to:: Private residence Living Arrangements: Children Available Help  at Discharge: Family Type of Home: House Home Access: Level entry   Entrance Stairs-Number of Steps: 1 (only one step for back entrance) Home Layout: One  level Home Equipment: [place] - 2 wheels;Bedside commode;Cane - single point;Tub bench;Walker - 4 wheels      Prior Function Level of Independence: Independent with assistive device(s)   Gait / Transfers Assistance Needed: Patient reports recent d/c from rehab where she is Mod I with rolling walker for ambulation  ADL's / Homemaking Assistance Needed: Mod I for ADLs with shower chair  Comments: no falls reported while at rehab     Hand Dominance        Extremity/Trunk Assessment   Upper Extremity Assessment Upper Extremity Assessment: Generalized weakness    Lower Extremity Assessment Lower Extremity Assessment: Generalized weakness       Communication   Communication: HOH  Cognition Arousal/Alertness: Awake/alert Behavior During Therapy: WFL for tasks assessed/performed Overall Cognitive Status: Within Functional Limits for tasks assessed                                 General Comments: patient able to recall 2/3 hip precuations. she is able to follow all commands without difficulty      General Comments      Exercises     Assessment/Plan    PT Assessment Patient needs continued PT services  PT Problem List Decreased strength;Decreased activity tolerance;Decreased balance;Decreased mobility;Decreased knowledge of use of DME;Decreased safety awareness       PT Treatment Interventions DME instruction;Gait training;Stair training;Therapeutic activities;Functional mobility training;Therapeutic exercise;Balance training;Neuromuscular re-education;Patient/family education    PT Goals (Current goals can be found in the Care Plan section)  Acute Rehab PT Goals Patient Stated Goal: to return home PT Goal Formulation: With patient Time For Goal Achievement: 12/05/20 Potential to Achieve Goals: Good    Frequency Min 2X/week   Barriers to discharge        Co-evaluation               AM-PAC PT "6 Clicks" Mobility  Outcome Measure Help  needed turning from your back to your side while in a flat bed without using bedrails?: None Help needed moving from lying on your back to sitting on the side of a flat bed without using bedrails?: A Little Help needed moving to and from a bed to a chair (including a wheelchair)?: A Little Help needed standing up from a chair using your arms (e.g., wheelchair or bedside chair)?: A Little Help needed to walk in hospital room?: A Little Help needed climbing 3-5 steps with a railing? : A Little 6 Click Score: 19    End of Session   Activity Tolerance: Patient tolerated treatment well Patient left: in bed;with call bell/phone within reach;with bed alarm set Nurse Communication: Other (comment) (white board updated with mobility status) PT Visit Diagnosis: Unsteadiness on feet (R26.81);Muscle weakness (generalized) (M62.81)    Time: 0175-1025 PT Time Calculation (min) (ACUTE ONLY): 17 min   Charges:   PT Evaluation $PT Eval Low Complexity: 1 Low PT Treatments $Gait Training: 8-22 mins        Minna Merritts, PT, MPT   Percell Locus 11/21/2020, 11:40 AM

## 2020-11-21 NOTE — Progress Notes (Signed)
Patient ID: Roberta [name], female   DOB: 1945/08/14, 75 y.o.   MRN: [id number] [facility] PROGRESS NOTE  Roberta [name] DGL:[id number] DOB: [birth year] DOA: 11/20/2020 PCP: Gayland Curry, MD  HPI/Subjective: Patient coming in with diarrhea.  Started on antibiotics for urinary tract infection.  Patient has some shortness of breath.  Patient was initially started on fluids.  Objective: Vitals:   11/21/20 0744 11/21/20 1140  BP: (!) 157/91 129/83  Pulse: (!) 110 (!) 45  Resp: 16 16  Temp: 98.2 F (36.8 C) 98.2 F (36.8 C)  SpO2: 100% (!) 89%    Intake/Output Summary (Last 24 hours) at 11/21/2020 1408 Last data filed at 11/21/2020 1024 Gross per 24 hour  Intake 380.92 ml  Output 400 ml  Net -19.08 ml   Filed Weights   11/20/20 1457  Weight: 66.7 kg    ROS: Review of Systems  Respiratory:  Positive for shortness of breath. Negative for cough.   Cardiovascular:  Negative for chest pain.  Gastrointestinal:  Negative for abdominal pain, nausea and vomiting.  Musculoskeletal:  Positive for back pain.  Exam: Physical Exam HENT:     Head: Normocephalic.     Mouth/Throat:     Pharynx: No oropharyngeal exudate.  Eyes:     General: Lids are normal.     Conjunctiva/sclera: Conjunctivae normal.  Cardiovascular:     Rate and Rhythm: Normal rate and regular rhythm.     Heart sounds: Normal heart sounds, S1 normal and S2 normal.  Pulmonary:     Breath sounds: Examination of the right-middle field reveals wheezing. Examination of the left-middle field reveals wheezing. Examination of the right-lower field reveals decreased breath sounds and rhonchi. Examination of the left-lower field reveals decreased breath sounds and rhonchi. Decreased breath sounds, wheezing and rhonchi present. No rales.  Abdominal:     Palpations: Abdomen is soft.     Tenderness: There is no abdominal tenderness.  Musculoskeletal:     Right lower leg: Swelling present.     Left lower leg:  Swelling present.  Skin:    General: Skin is warm.     Findings: No rash.  Neurological:     Mental Status: She is alert.     Data Reviewed: Basic Metabolic Panel: Recent Labs  Lab 11/20/20 1508 11/21/20 0251  NA 135 134*  K 4.2 3.6  CL 100 98  CO2 28 27  GLUCOSE 125* 88  BUN 21 16  CREATININE 0.88 0.82  CALCIUM 8.2* 8.2*  MG 1.9  --    Liver Function Tests: Recent Labs  Lab 11/20/20 1508  AST 22  ALT 14  ALKPHOS 178*  BILITOT 0.5  PROT 5.6*  ALBUMIN 2.6*   CBC: Recent Labs  Lab 11/20/20 1508 11/21/20 0251  WBC 8.8 9.0  NEUTROABS 7.0  --   HGB 9.6* 9.9*  HCT 31.4* 31.9*  MCV 96.0 95.8  PLT 443* 421*    BNP (last 3 results) Recent Labs    10/12/20 1112  BNP 1,331.8*     Recent Results (from the past 240 hour(s))  Resp Panel by RT-PCR (Flu A&B, Covid) Nasopharyngeal Swab     Status: None   Collection Time: 11/21/20 12:23 AM   Specimen: Nasopharyngeal Swab; Nasopharyngeal(NP) swabs in vial transport medium  Result Value Ref Range Status   SARS Coronavirus 2 by RT PCR NEGATIVE NEGATIVE Final    Comment: (NOTE) SARS-CoV-2 target nucleic acids are NOT DETECTED.  The SARS-CoV-2 RNA is generally detectable in upper  respiratory specimens during the acute phase of infection. The lowest concentration of SARS-CoV-2 viral copies this assay can detect is 138 copies/mL. A negative result does not preclude SARS-Cov-2 infection and should not be used as the sole basis for treatment or other patient management decisions. A negative result may occur with  improper specimen collection/handling, submission of specimen other than nasopharyngeal swab, presence of viral mutation(s) within the areas targeted by this assay, and inadequate number of viral copies(<138 copies/mL). A negative result must be combined with clinical observations, patient history, and epidemiological information. The expected result is Negative.  Fact Sheet for Patients:   EntrepreneurPulse.com.au  Fact Sheet for Healthcare Providers:  IncredibleEmployment.be  This test is no t yet approved or cleared by the [place] FDA and  has been authorized for detection and/or diagnosis of SARS-CoV-2 by FDA under an Emergency Use Authorization (EUA). This EUA will remain  in effect (meaning this test can be used) for the duration of the COVID-19 declaration under Section 564(b)(1) of the Act, 21 U.S.C.section 360bbb-3(b)(1), unless the authorization is terminated  or revoked sooner.       Influenza A by PCR NEGATIVE NEGATIVE Final   Influenza B by PCR NEGATIVE NEGATIVE Final    Comment: (NOTE) The Xpert Xpress SARS-CoV-2/FLU/RSV plus assay is intended as an aid in the diagnosis of influenza from Nasopharyngeal swab specimens and should not be used as a sole basis for treatment. Nasal washings and aspirates are unacceptable for Xpert Xpress SARS-CoV-2/FLU/RSV testing.  Fact Sheet for Patients: EntrepreneurPulse.com.au  Fact Sheet for Healthcare Providers: IncredibleEmployment.be  This test is not yet approved or cleared by the [place] FDA and has been authorized for detection and/or diagnosis of SARS-CoV-2 by FDA under an Emergency Use Authorization (EUA). This EUA will remain in effect (meaning this test can be used) for the duration of the COVID-19 declaration under Section 564(b)(1) of the Act, 21 U.S.C. section 360bbb-3(b)(1), unless the authorization is terminated or revoked.  Performed at [facility], [address] [place], [place] [address]   Culture, blood (x 2)     Status: None (Preliminary result)   Collection Time: 11/21/20  2:51 AM   Specimen: BLOOD  Result Value Ref Range Status   Specimen Description BLOOD BLOOD RIGHT HAND  Final   Special Requests   Final    BOTTLES DRAWN AEROBIC AND ANAEROBIC Blood Culture adequate volume   Culture   Final     NO GROWTH < 12 HOURS Performed at [facility], [address] [place], [place] [address]    Report Status PENDING  Incomplete  Culture, blood (x 2)     Status: None (Preliminary result)   Collection Time: 11/21/20  2:57 AM   Specimen: BLOOD  Result Value Ref Range Status   Specimen Description BLOOD BLOOD RIGHT HAND  Final   Special Requests   Final    BOTTLES DRAWN AEROBIC AND ANAEROBIC Blood Culture adequate volume   Culture   Final    NO GROWTH < 12 HOURS Performed at [facility], [address] [place], [place] [address]    Report Status PENDING  Incomplete     Studies: MR THORACIC SPINE WO CONTRAST  Result Date: 11/20/2020 CLINICAL DATA:  Initial evaluation for acute low back pain, fracture. EXAM: MRI THORACIC AND LUMBAR SPINE WITHOUT CONTRAST TECHNIQUE: Multiplanar and multiecho pulse sequences of the thoracic and lumbar spine were obtained without intravenous contrast. COMPARISON:  Prior CT from earlier the same day.  FINDINGS: MRI THORACIC SPINE FINDINGS Alignment: Straightening of the normal thoracic kyphosis with underlying sigmoid scoliotic curvature. No listhesis. Vertebrae: Acute to subacute compression fracture seen involving the superior endplate of T5. Associated height loss measures up to 20% without bony retropulsion. This is benign/mechanical in appearance. No other acute or subacute compression fracture within the thoracic spine. Chronic compression deformity with sequelae of prior vertebral augmentation noted at T6. Underlying bone marrow signal intensity within normal limits. No discrete or worrisome osseous lesions. Cord:  Normal signal and morphology. Paraspinal and other soft tissues: Paraspinous soft tissues demonstrate no acute finding. Extensive post treatment changes noted within the right lung, grossly similar to previous CT. Moderate right with small left layering pleural effusions. Disc levels: Ordinary for age multilevel disc  desiccation seen throughout the thoracic spine. No significant disc bulge or focal disc herniation. Scattered facet hypertrophy noted within the lower thoracic spine. No significant spinal stenosis. Mild to moderate bilateral foraminal narrowing noted on the right at T2-3, T3-4, T8-9, T9-10, and T10-11, and on the left at T6-7 through T9-10. Changes are largely due to facet spurring. MRI LUMBAR SPINE FINDINGS Segmentation:  Standard. Alignment:  Levo scoliosis.  3 mm retrolisthesis of L4 on L5. Vertebrae: Acute to subacute compression fracture extends through the inferior endplate of L1 with 44% central height loss without bony retropulsion. This is benign/mechanical in appearance. Additionally, there is an acute to subacute to insufficiency type fracture involving the left sacral ala (series 19, image 17), partially visualized. Chronic L4 compression fracture with up to 70% height loss and 4 mm bony retropulsion. Vertebral body height otherwise maintained. Bone marrow signal intensity within normal limits. No discrete or worrisome osseous lesions. No other abnormal marrow edema. Conus medullaris and cauda equina: Conus extends to the L1 level. Conus and cauda equina appear normal. Paraspinal and other soft tissues: Aortic aneurysm with stent endograft in place, better evaluated on prior CT. Visualized visceral structures otherwise unremarkable. Paraspinous soft tissues demonstrate no other acute finding. Disc levels: L1-2: Disc bulge with disc desiccation. Minimal facet spurring. No spinal stenosis. Foramina remain patent. L2-3: Degenerative intervertebral disc space narrowing with mild diffuse disc bulge and disc desiccation. Mild facet hypertrophy. No spinal stenosis. Foramina remain patent. L3-4: Diffuse disc bulge with disc desiccation. 4 mm bony retropulsion related to the chronic L4 compression fracture. Mild to moderate facet hypertrophy. Resultant mild canal with bilateral lateral recess stenosis. Mild  bilateral L3 foraminal narrowing. L4-5: Mild disc bulge with disc desiccation. Moderate facet hypertrophy. No significant spinal stenosis. Moderate left greater than right L4 foraminal narrowing. L5-S1: Disc desiccation with mild disc bulge. Mild facet hypertrophy. No more than mild narrowing of the lateral recesses bilaterally, slightly worse on the left. Central canal remains patent. No significant foraminal stenosis. IMPRESSION: MRI THORACIC SPINE IMPRESSION: 1. Acute to subacute compression fracture involving the superior endplate of T5 with up to 20% height loss without bony retropulsion. 2. No other acute abnormality within the thoracic spine. 3. Chronic compression deformity with sequelae of prior vertebral augmentation at T6. 4. Moderate right with small left layering pleural effusions, with extensive post radiation changes within the right lung. MR LUMBAR SPINE IMPRESSION: 1. Acute to subacute compression fracture involving the inferior endplate of L1 with 01% central height loss without bony retropulsion. 2. Acute insufficiency fracture involving the left sacral ala. 3. Chronic L4 compression fracture. 4. Underlying multilevel lumbar spondylosis with resultant mild spinal stenosis at L3-4, with mild to moderate bilateral L3 and L4 foraminal narrowing.  Electronically Signed   By: Jeannine Boga M.D.   On: 11/20/2020 22:54   MR LUMBAR SPINE WO CONTRAST  Result Date: 11/20/2020 CLINICAL DATA:  Initial evaluation for acute low back pain, fracture. EXAM: MRI THORACIC AND LUMBAR SPINE WITHOUT CONTRAST TECHNIQUE: Multiplanar and multiecho pulse sequences of the thoracic and lumbar spine were obtained without intravenous contrast. COMPARISON:  Prior CT from earlier the same day. FINDINGS: MRI THORACIC SPINE FINDINGS Alignment: Straightening of the normal thoracic kyphosis with underlying sigmoid scoliotic curvature. No listhesis. Vertebrae: Acute to subacute compression fracture seen involving the  superior endplate of T5. Associated height loss measures up to 20% without bony retropulsion. This is benign/mechanical in appearance. No other acute or subacute compression fracture within the thoracic spine. Chronic compression deformity with sequelae of prior vertebral augmentation noted at T6. Underlying bone marrow signal intensity within normal limits. No discrete or worrisome osseous lesions. Cord:  Normal signal and morphology. Paraspinal and other soft tissues: Paraspinous soft tissues demonstrate no acute finding. Extensive post treatment changes noted within the right lung, grossly similar to previous CT. Moderate right with small left layering pleural effusions. Disc levels: Ordinary for age multilevel disc desiccation seen throughout the thoracic spine. No significant disc bulge or focal disc herniation. Scattered facet hypertrophy noted within the lower thoracic spine. No significant spinal stenosis. Mild to moderate bilateral foraminal narrowing noted on the right at T2-3, T3-4, T8-9, T9-10, and T10-11, and on the left at T6-7 through T9-10. Changes are largely due to facet spurring. MRI LUMBAR SPINE FINDINGS Segmentation:  Standard. Alignment:  Levo scoliosis.  3 mm retrolisthesis of L4 on L5. Vertebrae: Acute to subacute compression fracture extends through the inferior endplate of L1 with 05% central height loss without bony retropulsion. This is benign/mechanical in appearance. Additionally, there is an acute to subacute to insufficiency type fracture involving the left sacral ala (series 19, image 17), partially visualized. Chronic L4 compression fracture with up to 70% height loss and 4 mm bony retropulsion. Vertebral body height otherwise maintained. Bone marrow signal intensity within normal limits. No discrete or worrisome osseous lesions. No other abnormal marrow edema. Conus medullaris and cauda equina: Conus extends to the L1 level. Conus and cauda equina appear normal. Paraspinal and  other soft tissues: Aortic aneurysm with stent endograft in place, better evaluated on prior CT. Visualized visceral structures otherwise unremarkable. Paraspinous soft tissues demonstrate no other acute finding. Disc levels: L1-2: Disc bulge with disc desiccation. Minimal facet spurring. No spinal stenosis. Foramina remain patent. L2-3: Degenerative intervertebral disc space narrowing with mild diffuse disc bulge and disc desiccation. Mild facet hypertrophy. No spinal stenosis. Foramina remain patent. L3-4: Diffuse disc bulge with disc desiccation. 4 mm bony retropulsion related to the chronic L4 compression fracture. Mild to moderate facet hypertrophy. Resultant mild canal with bilateral lateral recess stenosis. Mild bilateral L3 foraminal narrowing. L4-5: Mild disc bulge with disc desiccation. Moderate facet hypertrophy. No significant spinal stenosis. Moderate left greater than right L4 foraminal narrowing. L5-S1: Disc desiccation with mild disc bulge. Mild facet hypertrophy. No more than mild narrowing of the lateral recesses bilaterally, slightly worse on the left. Central canal remains patent. No significant foraminal stenosis. IMPRESSION: MRI THORACIC SPINE IMPRESSION: 1. Acute to subacute compression fracture involving the superior endplate of T5 with up to 20% height loss without bony retropulsion. 2. No other acute abnormality within the thoracic spine. 3. Chronic compression deformity with sequelae of prior vertebral augmentation at T6. 4. Moderate right with small left layering pleural  effusions, with extensive post radiation changes within the right lung. MR LUMBAR SPINE IMPRESSION: 1. Acute to subacute compression fracture involving the inferior endplate of L1 with 23% central height loss without bony retropulsion. 2. Acute insufficiency fracture involving the left sacral ala. 3. Chronic L4 compression fracture. 4. Underlying multilevel lumbar spondylosis with resultant mild spinal stenosis at L3-4,  with mild to moderate bilateral L3 and L4 foraminal narrowing. Electronically Signed   By: Jeannine Boga M.D.   On: 11/20/2020 22:54   CT ABDOMEN PELVIS W CONTRAST  Result Date: 11/20/2020 CLINICAL DATA:  Suspected abdominal infection or abscess in a 75 year old female. EXAM: CT ABDOMEN AND PELVIS WITH CONTRAST TECHNIQUE: Multidetector CT imaging of the abdomen and pelvis was performed using the standard protocol following bolus administration of intravenous contrast. CONTRAST:  16mL OMNIPAQUE IOHEXOL 300 MG/ML  SOLN COMPARISON:  May 04, 2020. FINDINGS: Lower chest: Small bilateral pleural effusions. Pulmonary emphysema. Hepatobiliary: Hepatic steatosis. No focal hepatic lesion. The portal vein is patent. No pericholecystic stranding. No biliary duct dilation. Pancreas: Mild pancreatic atrophy. No focal lesion, inflammation or ductal dilation. Spleen: Normal spleen. Adrenals/Urinary Tract: Normal adrenal glands. Symmetric renal enhancement. No hydronephrosis. Urinary bladder with smooth contours. Stomach/Bowel: No acute gastrointestinal process. Small bowel is normal caliber. The appendix is normal. Stool fills much of the colon. No pericolonic inflammation. Colonic diverticulosis and diverticular disease mainly in the sigmoid colon where this is moderate to marked. No free air. Vascular/Lymphatic: Signs of aorto bi-iliac endograft placement. Similar appearance of the graft and the endo sac which measures approximately 3.3 x 2.9 cm previously 3.5 x 3.1 cm greatest axial dimension. Abdominal vasculature is patent. Smooth contour of the IVC. There is no gastrohepatic or hepatoduodenal ligament lymphadenopathy. No retroperitoneal or mesenteric lymphadenopathy. No pelvic sidewall lymphadenopathy. Reproductive: Unremarkable, no adnexal mass. Other: No free air. Musculoskeletal: Osteopenia. Post LEFT hip arthroplasty. Fluid collection in the superficial soft tissues overlying the LEFT gluteal region and  superior LEFT hip measuring 7.0 x 6.6 cm. Fracture of the LEFT inferior pubic ramus with callus formation compatible with subacute or chronic fracture. Signs of cement augmentation at the L4 level with similar loss of height when compared to previous imaging. New loss of height of L1 along the inferior endplate when compared to imaging from December of 2021, see dedicated lumbar spine obtained on November 21, 2019 as well and reported separately. Linear sclerosis of the LEFT sacral ala new from previous imaging. IMPRESSION: 1. No acute intra-abdominal or pelvic findings. 2. Fluid collection in the superficial soft tissues overlying the LEFT gluteal region and superior LEFT hip measuring 7.0 x 6.6 cm. This may represent a postoperative seroma, early infected collection is also considered. Correlate with any signs of infection in this area, this is immediately deep to the skin surface. 3. Fracture of the LEFT inferior pubic ramus with callus formation compatible with subacute fracture. 4. Linear sclerosis in the LEFT sacral ala is new compared to prior imaging likely reflecting an insufficiency fracture of the LEFT sacral ala. 5. New loss of height at L1 compatible with interval fracture of the inferior endplate of L1 since May of 2022. 6. Hepatic steatosis. 7. Small bilateral pleural effusions. 8. Similar appearance of aorto bi-iliac endograft. 9. Emphysema and aortic atherosclerosis. Aortic Atherosclerosis (ICD10-I70.0) and Emphysema (ICD10-J43.9). Electronically Signed   By: Zetta Bills M.D.   On: 11/20/2020 16:41   CT L-SPINE NO CHARGE  Result Date: 11/20/2020 CLINICAL DATA:  Back pain. EXAM: CT LUMBAR SPINE WITH CONTRAST  TECHNIQUE: Technique: Multiplanar CT images of the lumbar spine were reconstructed from contemporary CT of the Abdomen and Pelvis. CONTRAST:  100 mL Omnipaque 300 given for concurrent CT of the abdomen and pelvis COMPARISON:  CT abdomen and pelvis 05/04/2020. CTA chest 10/12/2020. FINDINGS:  Segmentation: 5 lumbar type vertebrae. Alignment: Mild lumbar levoscoliosis. Minimal retrolisthesis of L4 on L5. Vertebrae: Chronic L4 compression fracture with severe anterior vertebral body height loss, previously augmented. L1 inferior endplate compression fracture with 25% vertebral body height loss and mild sclerosis along the inferior endplate, new from last month's chest CTA. No suspicious osseous lesion. Paraspinal and other soft tissues: No significant paraspinal soft tissue edema associated with the L1 fracture. Intra-abdominal and pelvic contents reported separately. Disc levels: At L3-4, disc bulging, mild retropulsion of the L4 superior endplate, and facet hypertrophy result in mild spinal stenosis. At L4-5, disc bulging and facet and ligamentum flavum hypertrophy result in borderline spinal stenosis. There is mild-to-moderate neural foraminal stenosis from L2-3 to L5-S1. IMPRESSION: 1. Acute or subacute L1 compression fracture with 25% height loss. 2. Chronic L4 compression fracture. 3. Mild spinal stenosis at L3-4. 4. Mild-to-moderate multilevel neural foraminal stenosis. Electronically Signed   By: Logan Bores M.D.   On: 11/20/2020 16:44    Scheduled Meds:  albuterol  2.5 mg Nebulization TID   aspirin EC  81 mg Oral Daily   atorvastatin  10 mg Oral QHS   COVID-19 mRNA Vac-TriS (Pf[employer])  0.3 mL Intramuscular Once   enoxaparin  40 mg Subcutaneous Q24H   [START ON 11/22/2020] furosemide  20 mg Oral Daily   ipratropium  0.5 mg Nebulization BID   metoprolol succinate  25 mg Oral Daily   mometasone-formoterol  2 puff Inhalation BID   nicotine  14 mg Transdermal Daily   pantoprazole  40 mg Oral Daily   vitamin B-12  1,000 mcg Oral Daily   Continuous Infusions:  cefTRIAXone (ROCEPHIN)  IV      Assessment/Plan:  Acute hypoxic respiratory failure pulse ox 89% on room air.  With noises in the lungs I will discontinue IV fluids give a dose of IV Lasix obtain a chest x-ray.  Patient has a  history of chronic systolic congestive heart failure. Fluid overload and acute on chronic systolic congestive heart failure.  Last EF 25 to 30%.  Give IV Lasix.  Continue Toprol. Back pain.  CT scan showing T5 and L1 compression fractures.  Likely from fall a few weeks ago.  Patient not having that much pain.  I will give Miacalcin nasal spray, sometimes helps out with pain. Diarrhea.  Stool studies if has any more bowel movements.  Stopping anticonstipation meds should help. Acute cystitis without hematuria.  Started on Rocephin.  Follow-up urine culture.  Sepsis ruled out. COPD.  Makes nebulizer standing dose Daughter requested COVID-19 booster. Vitamin B12 deficiency.  Oral B12 supplementation Chronic small vessel ischemia seen on prior MRI.  On aspirin Recent hip fracture.  Postoperative seroma. We will get palliative care consultation Hyperlipidemia unspecified on atorvastatin History of small cell lung cancer by biopsy in 2018, history of breast cancer         Code Status:     Code Status Orders  (From admission, onward)           Start     Ordered   11/21/20 0003  Do not attempt resuscitation (DNR)  Continuous       Question Answer Comment  In the event of cardiac or respiratory ARREST  Do not call a "code blue"   In the event of cardiac or respiratory ARREST Do not perform Intubation, CPR, defibrillation or ACLS   In the event of cardiac or respiratory ARREST Use medication by any route, position, wound care, and other measures to relive pain and suffering. May use oxygen, suction and manual treatment of airway obstruction as needed for comfort.   Comments CODE STATUS was discussed with patient at the bedside and she wishes to be placed on  a DO NOT RESUSCITATE status.      11/21/20 0002           Code Status History     Date Active Date Inactive Code Status Order ID Comments User Context   10/24/2020 1133 10/30/2020 1927 DNR [id number]  Collier Bullock, MD ED    10/24/2020 0812 10/24/2020 1133 Full Code [id number]  Collier Bullock, MD ED   10/12/2020 1303 10/17/2020 1936 Full Code [id number]  Acheampong, Warnell Bureau, MD ED   09/07/2020 1248 09/09/2020 1954 DNR [id number]  Ivor Costa, MD ED   12/16/2019 1633 12/16/2019 2251 Full Code [id number]  Hessie Knows, MD Inpatient   06/11/2018 1740 06/11/2018 2133 Full Code [id number]  Hessie Knows, MD Inpatient   10/29/2017 0804 10/30/2017 1743 Full Code [id number]  Gladstone Lighter, MD Inpatient   04/28/2017 1146 05/02/2017 1531 DNR [id number]  Nicholes Mango, MD ED   12/06/2016 1117 12/07/2016 1547 Full Code [id number]  Algernon Huxley, MD Inpatient      Advance Directive Documentation    Flowsheet Row Most Recent Value  Type of Advance Directive Healthcare Power of Attorney  Pre-existing out of facility DNR order (yellow form or pink MOST form) --  "MOST" Form in Place? --      Family Communication: Spoke with daughter at the bedside Disposition Plan: Status is: Inpatient  Dispo: The patient is from: Rehab              Anticipated d/c is to: Rehab and then conversion to long-term care as per daughter              Patient currently being treated for acute cystitis, fluid overload and CHF.  Also had diarrhea.   Difficult to place patient.  Hopefully not  Time spent: 32 minutes  [place]

## 2020-11-21 NOTE — ED Notes (Signed)
Report to lorrie, rn.  

## 2020-11-21 NOTE — H&P (Signed)
[place]   PATIENT NAME: Roberta [name]    MR#:  [id number]  DATE OF BIRTH:  1945-07-24  DATE OF ADMISSION:  11/20/2020  PRIMARY CARE PHYSICIAN: Gayland Curry, MD   Patient is coming from: Home  REQUESTING/REFERRING PHYSICIAN: Marjean Donna, MD  CHIEF COMPLAINT:   Chief Complaint  Patient presents with  . Diarrhea    HISTORY OF PRESENT ILLNESS:  Roberta [name] is a 75 y.o. Caucasian female with medical history significant for combined systolic diastolic CHF, COPD, coronary artery disease, who recently had a left hip fracture for which she was admitted here on 5/29 and underwent left hemiarthroplasty then was discharged to rehab on 6/5.  She has been there till few days ago when she was discharged home from rehab.  She has been experiencing foul-smelling urine and urinary and stool incontinence with diarrhea.  No reported fever or chills.  No reported nausea or vomiting.  He has been noticed to have tarry stools.  She admits to intermittent abdominal pain mainly in the left lower quadrant.  No chest pain or dyspnea or cough or wheezing.  She has chronic back pain.  No new trauma or falls.  She admits to headache without dizziness or blurred vision.  No paresthesias or focal muscle weakness.  She has chronic back pain without recent worsening.  ED Course: When she came to the ER heart rate was 117 with otherwise normal vital signs.  Later heart rate was down to 96 respiratory rate was 24 with a heart rate of 96.  Labs revealed a UA that was positive for UTI.  CMP revealed an alk phos of 178 with albumin of 2.6 and total protein 5.6 and CBC showed anemia better than previous levels. EKG as reviewed by me : EKG showed sinus rhythm with a rate of 93 with left atrial enlargement. Imaging: Abdominal pelvic CT scan revealed the following: 1. No acute intra-abdominal or pelvic findings. 2. Fluid collection in the superficial soft tissues overlying the LEFT gluteal region and  superior LEFT hip measuring 7.0 x 6.6 cm. This may represent a postoperative seroma, early infected collection is also considered. Correlate with any signs of infection in this area, this is immediately deep to the skin surface. 3. Fracture of the LEFT inferior pubic ramus with callus formation compatible with subacute fracture. 4. Linear sclerosis in the LEFT sacral ala is new compared to prior imaging likely reflecting an insufficiency fracture of the LEFT sacral ala. 5. New loss of height at L1 compatible with interval fracture of the inferior endplate of L1 since May of 2022. 6. Hepatic steatosis. 7. Small bilateral pleural effusions. 8. Similar appearance of aorto bi-iliac endograft. 9. Emphysema and aortic atherosclerosis.  MRI of thoracic spine showed the following: 1. Acute to subacute compression fracture involving the superior endplate of T5 with up to 20% height loss without bony retropulsion. 2. No other acute abnormality within the thoracic spine. 3. Chronic compression deformity with sequelae of prior vertebral augmentation at T6. 4. Moderate right with small left layering pleural effusions, with extensive post radiation changes within the right lung.  MRI of the lumbar spine showed the following: 1. Acute to subacute compression fracture involving the inferior endplate of L1 with 59% central height loss without bony retropulsion. 2. Acute insufficiency fracture involving the left sacral ala. 3. Chronic L4 compression fracture. 4. Underlying multilevel lumbar spondylosis with resultant mild spinal stenosis at L3-4, with mild to moderate bilateral L3 and L4 foraminal  narrowing.  The patient was given 1 g of IV ceftriaxone, 50 mcg of IV fentanyl and 4 mg of IV Zofran.  She will be admitted to a medical bed for further evaluation and management. PAST MEDICAL HISTORY:   Past Medical History:  Diagnosis Date  . AAA (abdominal aortic aneurysm) ([place])   . Anemia   .  Breast cancer ([place]) 2002   left  . Cardiomyopathy ([place])    a. 09/2020 Echo: EF 25-30%, glob HK, Gr2 DD, mod reduced RV fxn. Mod dil LA. Mild to mod MR. Mod AS.  Marland Kitchen Chronic combined systolic and diastolic CHF (congestive heart failure) ([place])    a. 09/2020 Echo: EF 25-30%, glob HK, gr2 DD.  Marland Kitchen COPD (chronic obstructive pulmonary disease) ([place])   . Coronary artery calcification seen on CT scan   . Dyspnea   . Headache   . High cholesterol   . History of stress test    a. 10/2016 Low risk MV. EF 60%.  . Mitral regurgitation   . Moderate aortic stenosis   . Personal history of chemotherapy   . Small cell lung cancer ([place])   . Tobacco abuse     PAST SURGICAL HISTORY:   Past Surgical History:  Procedure Laterality Date  . ABDOMINAL HYSTERECTOMY    . ARTERY BIOPSY Right 12/05/2017   Procedure: BIOPSY TEMPORAL ARTERY;  Surgeon: Margaretha Sheffield, MD;  Location: [facility];  Service: ENT;  Laterality: Right;  . ENDOBRONCHIAL ULTRASOUND N/A 02/19/2017   Procedure: ENDOBRONCHIAL ULTRASOUND;  Surgeon: Flora Lipps, MD;  Location: [facility];  Service: Cardiopulmonary;  Laterality: N/A;  . ENDOVASCULAR REPAIR/STENT GRAFT N/A 12/06/2016   Procedure: Endovascular Repair/Stent Graft;  Surgeon: Katha Cabal, MD;  Location: [facility];  Service: Cardiovascular;  Laterality: N/A;  . HIP ARTHROPLASTY Left 10/26/2020   Procedure: ARTHROPLASTY BIPOLAR HIP (HEMIARTHROPLASTY);  Surgeon: Renee Harder, MD;  Location: [facility];  Service: Orthopedics;  Laterality: Left;  . IR FLUORO GUIDE PORT INSERTION RIGHT  03/02/2017  . KYPHOPLASTY N/A 06/11/2018   Procedure: KYPHOPLASTY L4 BIOPSY WITH RFA;  Surgeon: Hessie Knows, MD;  Location: [facility];  Service: Orthopedics;  Laterality: N/A;  . KYPHOPLASTY N/A 12/16/2019   Procedure: T6 KYPHOPLASTY;  Surgeon: Hessie Knows, MD;  Location: [facility];  Service: Orthopedics;  Laterality: N/A;  . MASTECTOMY Left 2002   with sentinel node     SOCIAL HISTORY:    Social History   Tobacco Use  . Smoking status: Former    Packs/day: 1.50    Years: 50.00    Pack years: 75.00    Types: Cigarettes  . Smokeless tobacco: Never  Substance Use Topics  . Alcohol use: No    FAMILY HISTORY:   Family History  Problem Relation Age of Onset  . CAD Mother   . Colon cancer Brother   . Breast cancer Neg Hx     DRUG ALLERGIES:  No Known Allergies  REVIEW OF SYSTEMS:   ROS As per history of present illness. All pertinent systems were reviewed above. Constitutional, HEENT, cardiovascular, respiratory, GI, GU, musculoskeletal, neuro, psychiatric, endocrine, integumentary and hematologic systems were reviewed and are otherwise negative/unremarkable except for positive findings mentioned above in the HPI.   MEDICATIONS AT HOME:   Prior to Admission medications   Medication Sig Start Date End Date Taking? Authorizing Provider  acetaminophen (TYLENOL) 325 MG tablet Take 650 mg by mouth every 6 (six) hours as needed for mild pain.     [provider]  albuterol (PROVENTIL HFA;VENTOLIN HFA) 108 (90 Base) MCG/ACT inhaler Inhale 2 puffs into the lungs every 6 (six) hours as needed for wheezing or shortness of breath. 03/23/16   Frederich Cha, MD  aspirin EC 81 MG EC tablet Take 1 tablet (81 mg total) by mouth daily. Swallow whole. 09/09/20   Bonnielee Haff, MD  atorvastatin (LIPITOR) 10 MG tablet Take 10 mg by mouth at bedtime.  08/11/16   [provider]  enoxaparin (LOVENOX) 40 MG/0.4ML injection Inject 0.4 mLs (40 mg total) into the skin daily for 14 days. 10/29/20 11/12/20  Wyvonnia Dusky, MD  fluticasone-salmeterol (ADVAIR HFA) [phone] MCG/ACT inhaler Inhale 2 puffs into the lungs 2 (two) times daily.    [provider]  furosemide (LASIX) 20 MG tablet Take 1 tablet (20 mg total) by mouth every Monday, Wednesday, and Friday. 10/18/20   Ralene Muskrat B, MD  ipratropium (ATROVENT) 0.02 % nebulizer solution Take 3 mLs by  nebulization in the morning, at noon, in the evening, and at bedtime. 10/07/20 10/07/21  [provider]  metoprolol succinate (TOPROL-XL) 25 MG 24 hr tablet Take 1 tablet (25 mg total) by mouth daily. 10/18/20 11/17/20  Sidney Ace, MD  nicotine (NICODERM CQ - DOSED IN MG/24 HOURS) 14 mg/24hr patch Place 1 patch (14 mg total) onto the skin daily. 10/18/20   Sidney Ace, MD  omeprazole (PRILOSEC) 20 MG capsule Take 1 capsule (20 mg total) by mouth daily. 09/27/20   Jacquelin Hawking, NP  vitamin B-12 (CYANOCOBALAMIN) 1000 MCG tablet Take 1 tablet (1,000 mcg total) by mouth daily. 09/09/20   Bonnielee Haff, MD      VITAL SIGNS:  Blood pressure 136/86, pulse 96, temperature (!) 97.5 F (36.4 C), temperature source Oral, resp. rate 15, height '5\' 7"'  (1.702 m), weight 66.7 kg, SpO2 98 %.  PHYSICAL EXAMINATION:  Physical Exam  GENERAL:  74 y.o.-year-old Caucasian female patient lying in the bed with no acute distress.  EYES: Pupils equal, round, reactive to light and accommodation. No scleral icterus. Extraocular muscles intact.  HEENT: Head atraumatic, normocephalic. Oropharynx and nasopharynx clear.  NECK:  Supple, no jugular venous distention. No thyroid enlargement, no tenderness.  LUNGS: Normal breath sounds bilaterally, no wheezing, rales,rhonchi or crepitation. No use of accessory muscles of respiration.  CARDIOVASCULAR: Regular rate and rhythm, S1, S2 normal. No murmurs, rubs, or gallops.  ABDOMEN: Soft, nondistended, nontender. Bowel sounds present. No organomegaly or mass.  EXTREMITIES: No pedal edema, cyanosis, or clubbing.  NEUROLOGIC: Cranial nerves II through XII are intact. Muscle strength 5/5 in all extremities. Sensation intact. Gait not checked.  PSYCHIATRIC: The patient is alert and oriented x 3.  Normal affect and good eye contact. SKIN: No obvious rash, lesion, or ulcer.   LABORATORY PANEL:   CBC Recent Labs  Lab 11/20/20 1508  WBC 8.8  HGB 9.6*   HCT 31.4*  PLT 443*   ------------------------------------------------------------------------------------------------------------------  Chemistries  Recent Labs  Lab 11/20/20 1508  NA 135  K 4.2  CL 100  CO2 28  GLUCOSE 125*  BUN 21  CREATININE 0.88  CALCIUM 8.2*  MG 1.9  AST 22  ALT 14  ALKPHOS 178*  BILITOT 0.5   ------------------------------------------------------------------------------------------------------------------  Cardiac Enzymes No results for input(s): TROPONINI in the last 168 hours. ------------------------------------------------------------------------------------------------------------------  RADIOLOGY:  MR THORACIC SPINE WO CONTRAST  Result Date: 11/20/2020 CLINICAL DATA:  Initial evaluation for acute low back pain, fracture. EXAM: MRI THORACIC AND LUMBAR SPINE WITHOUT CONTRAST TECHNIQUE:  Multiplanar and multiecho pulse sequences of the thoracic and lumbar spine were obtained without intravenous contrast. COMPARISON:  Prior CT from earlier the same day. FINDINGS: MRI THORACIC SPINE FINDINGS Alignment: Straightening of the normal thoracic kyphosis with underlying sigmoid scoliotic curvature. No listhesis. Vertebrae: Acute to subacute compression fracture seen involving the superior endplate of T5. Associated height loss measures up to 20% without bony retropulsion. This is benign/mechanical in appearance. No other acute or subacute compression fracture within the thoracic spine. Chronic compression deformity with sequelae of prior vertebral augmentation noted at T6. Underlying bone marrow signal intensity within normal limits. No discrete or worrisome osseous lesions. Cord:  Normal signal and morphology. Paraspinal and other soft tissues: Paraspinous soft tissues demonstrate no acute finding. Extensive post treatment changes noted within the right lung, grossly similar to previous CT. Moderate right with small left layering pleural effusions. Disc levels:  Ordinary for age multilevel disc desiccation seen throughout the thoracic spine. No significant disc bulge or focal disc herniation. Scattered facet hypertrophy noted within the lower thoracic spine. No significant spinal stenosis. Mild to moderate bilateral foraminal narrowing noted on the right at T2-3, T3-4, T8-9, T9-10, and T10-11, and on the left at T6-7 through T9-10. Changes are largely due to facet spurring. MRI LUMBAR SPINE FINDINGS Segmentation:  Standard. Alignment:  Levo scoliosis.  3 mm retrolisthesis of L4 on L5. Vertebrae: Acute to subacute compression fracture extends through the inferior endplate of L1 with 57% central height loss without bony retropulsion. This is benign/mechanical in appearance. Additionally, there is an acute to subacute to insufficiency type fracture involving the left sacral ala (series 19, image 17), partially visualized. Chronic L4 compression fracture with up to 70% height loss and 4 mm bony retropulsion. Vertebral body height otherwise maintained. Bone marrow signal intensity within normal limits. No discrete or worrisome osseous lesions. No other abnormal marrow edema. Conus medullaris and cauda equina: Conus extends to the L1 level. Conus and cauda equina appear normal. Paraspinal and other soft tissues: Aortic aneurysm with stent endograft in place, better evaluated on prior CT. Visualized visceral structures otherwise unremarkable. Paraspinous soft tissues demonstrate no other acute finding. Disc levels: L1-2: Disc bulge with disc desiccation. Minimal facet spurring. No spinal stenosis. Foramina remain patent. L2-3: Degenerative intervertebral disc space narrowing with mild diffuse disc bulge and disc desiccation. Mild facet hypertrophy. No spinal stenosis. Foramina remain patent. L3-4: Diffuse disc bulge with disc desiccation. 4 mm bony retropulsion related to the chronic L4 compression fracture. Mild to moderate facet hypertrophy. Resultant mild canal with bilateral  lateral recess stenosis. Mild bilateral L3 foraminal narrowing. L4-5: Mild disc bulge with disc desiccation. Moderate facet hypertrophy. No significant spinal stenosis. Moderate left greater than right L4 foraminal narrowing. L5-S1: Disc desiccation with mild disc bulge. Mild facet hypertrophy. No more than mild narrowing of the lateral recesses bilaterally, slightly worse on the left. Central canal remains patent. No significant foraminal stenosis. IMPRESSION: MRI THORACIC SPINE IMPRESSION: 1. Acute to subacute compression fracture involving the superior endplate of T5 with up to 20% height loss without bony retropulsion. 2. No other acute abnormality within the thoracic spine. 3. Chronic compression deformity with sequelae of prior vertebral augmentation at T6. 4. Moderate right with small left layering pleural effusions, with extensive post radiation changes within the right lung. MR LUMBAR SPINE IMPRESSION: 1. Acute to subacute compression fracture involving the inferior endplate of L1 with 32% central height loss without bony retropulsion. 2. Acute insufficiency fracture involving the left sacral ala. 3. Chronic  L4 compression fracture. 4. Underlying multilevel lumbar spondylosis with resultant mild spinal stenosis at L3-4, with mild to moderate bilateral L3 and L4 foraminal narrowing. Electronically Signed   By: Jeannine Boga M.D.   On: 11/20/2020 22:54   MR LUMBAR SPINE WO CONTRAST  Result Date: 11/20/2020 CLINICAL DATA:  Initial evaluation for acute low back pain, fracture. EXAM: MRI THORACIC AND LUMBAR SPINE WITHOUT CONTRAST TECHNIQUE: Multiplanar and multiecho pulse sequences of the thoracic and lumbar spine were obtained without intravenous contrast. COMPARISON:  Prior CT from earlier the same day. FINDINGS: MRI THORACIC SPINE FINDINGS Alignment: Straightening of the normal thoracic kyphosis with underlying sigmoid scoliotic curvature. No listhesis. Vertebrae: Acute to subacute compression  fracture seen involving the superior endplate of T5. Associated height loss measures up to 20% without bony retropulsion. This is benign/mechanical in appearance. No other acute or subacute compression fracture within the thoracic spine. Chronic compression deformity with sequelae of prior vertebral augmentation noted at T6. Underlying bone marrow signal intensity within normal limits. No discrete or worrisome osseous lesions. Cord:  Normal signal and morphology. Paraspinal and other soft tissues: Paraspinous soft tissues demonstrate no acute finding. Extensive post treatment changes noted within the right lung, grossly similar to previous CT. Moderate right with small left layering pleural effusions. Disc levels: Ordinary for age multilevel disc desiccation seen throughout the thoracic spine. No significant disc bulge or focal disc herniation. Scattered facet hypertrophy noted within the lower thoracic spine. No significant spinal stenosis. Mild to moderate bilateral foraminal narrowing noted on the right at T2-3, T3-4, T8-9, T9-10, and T10-11, and on the left at T6-7 through T9-10. Changes are largely due to facet spurring. MRI LUMBAR SPINE FINDINGS Segmentation:  Standard. Alignment:  Levo scoliosis.  3 mm retrolisthesis of L4 on L5. Vertebrae: Acute to subacute compression fracture extends through the inferior endplate of L1 with 07% central height loss without bony retropulsion. This is benign/mechanical in appearance. Additionally, there is an acute to subacute to insufficiency type fracture involving the left sacral ala (series 19, image 17), partially visualized. Chronic L4 compression fracture with up to 70% height loss and 4 mm bony retropulsion. Vertebral body height otherwise maintained. Bone marrow signal intensity within normal limits. No discrete or worrisome osseous lesions. No other abnormal marrow edema. Conus medullaris and cauda equina: Conus extends to the L1 level. Conus and cauda equina appear  normal. Paraspinal and other soft tissues: Aortic aneurysm with stent endograft in place, better evaluated on prior CT. Visualized visceral structures otherwise unremarkable. Paraspinous soft tissues demonstrate no other acute finding. Disc levels: L1-2: Disc bulge with disc desiccation. Minimal facet spurring. No spinal stenosis. Foramina remain patent. L2-3: Degenerative intervertebral disc space narrowing with mild diffuse disc bulge and disc desiccation. Mild facet hypertrophy. No spinal stenosis. Foramina remain patent. L3-4: Diffuse disc bulge with disc desiccation. 4 mm bony retropulsion related to the chronic L4 compression fracture. Mild to moderate facet hypertrophy. Resultant mild canal with bilateral lateral recess stenosis. Mild bilateral L3 foraminal narrowing. L4-5: Mild disc bulge with disc desiccation. Moderate facet hypertrophy. No significant spinal stenosis. Moderate left greater than right L4 foraminal narrowing. L5-S1: Disc desiccation with mild disc bulge. Mild facet hypertrophy. No more than mild narrowing of the lateral recesses bilaterally, slightly worse on the left. Central canal remains patent. No significant foraminal stenosis. IMPRESSION: MRI THORACIC SPINE IMPRESSION: 1. Acute to subacute compression fracture involving the superior endplate of T5 with up to 20% height loss without bony retropulsion. 2. No other acute  abnormality within the thoracic spine. 3. Chronic compression deformity with sequelae of prior vertebral augmentation at T6. 4. Moderate right with small left layering pleural effusions, with extensive post radiation changes within the right lung. MR LUMBAR SPINE IMPRESSION: 1. Acute to subacute compression fracture involving the inferior endplate of L1 with 45% central height loss without bony retropulsion. 2. Acute insufficiency fracture involving the left sacral ala. 3. Chronic L4 compression fracture. 4. Underlying multilevel lumbar spondylosis with resultant mild  spinal stenosis at L3-4, with mild to moderate bilateral L3 and L4 foraminal narrowing. Electronically Signed   By: Jeannine Boga M.D.   On: 11/20/2020 22:54   CT ABDOMEN PELVIS W CONTRAST  Result Date: 11/20/2020 CLINICAL DATA:  Suspected abdominal infection or abscess in a 75 year old female. EXAM: CT ABDOMEN AND PELVIS WITH CONTRAST TECHNIQUE: Multidetector CT imaging of the abdomen and pelvis was performed using the standard protocol following bolus administration of intravenous contrast. CONTRAST:  131m OMNIPAQUE IOHEXOL 300 MG/ML  SOLN COMPARISON:  May 04, 2020. FINDINGS: Lower chest: Small bilateral pleural effusions. Pulmonary emphysema. Hepatobiliary: Hepatic steatosis. No focal hepatic lesion. The portal vein is patent. No pericholecystic stranding. No biliary duct dilation. Pancreas: Mild pancreatic atrophy. No focal lesion, inflammation or ductal dilation. Spleen: Normal spleen. Adrenals/Urinary Tract: Normal adrenal glands. Symmetric renal enhancement. No hydronephrosis. Urinary bladder with smooth contours. Stomach/Bowel: No acute gastrointestinal process. Small bowel is normal caliber. The appendix is normal. Stool fills much of the colon. No pericolonic inflammation. Colonic diverticulosis and diverticular disease mainly in the sigmoid colon where this is moderate to marked. No free air. Vascular/Lymphatic: Signs of aorto bi-iliac endograft placement. Similar appearance of the graft and the endo sac which measures approximately 3.3 x 2.9 cm previously 3.5 x 3.1 cm greatest axial dimension. Abdominal vasculature is patent. Smooth contour of the IVC. There is no gastrohepatic or hepatoduodenal ligament lymphadenopathy. No retroperitoneal or mesenteric lymphadenopathy. No pelvic sidewall lymphadenopathy. Reproductive: Unremarkable, no adnexal mass. Other: No free air. Musculoskeletal: Osteopenia. Post LEFT hip arthroplasty. Fluid collection in the superficial soft tissues overlying the  LEFT gluteal region and superior LEFT hip measuring 7.0 x 6.6 cm. Fracture of the LEFT inferior pubic ramus with callus formation compatible with subacute or chronic fracture. Signs of cement augmentation at the L4 level with similar loss of height when compared to previous imaging. New loss of height of L1 along the inferior endplate when compared to imaging from December of 2021, see dedicated lumbar spine obtained on November 21, 2019 as well and reported separately. Linear sclerosis of the LEFT sacral ala new from previous imaging. IMPRESSION: 1. No acute intra-abdominal or pelvic findings. 2. Fluid collection in the superficial soft tissues overlying the LEFT gluteal region and superior LEFT hip measuring 7.0 x 6.6 cm. This may represent a postoperative seroma, early infected collection is also considered. Correlate with any signs of infection in this area, this is immediately deep to the skin surface. 3. Fracture of the LEFT inferior pubic ramus with callus formation compatible with subacute fracture. 4. Linear sclerosis in the LEFT sacral ala is new compared to prior imaging likely reflecting an insufficiency fracture of the LEFT sacral ala. 5. New loss of height at L1 compatible with interval fracture of the inferior endplate of L1 since May of 2022. 6. Hepatic steatosis. 7. Small bilateral pleural effusions. 8. Similar appearance of aorto bi-iliac endograft. 9. Emphysema and aortic atherosclerosis. Aortic Atherosclerosis (ICD10-I70.0) and Emphysema (ICD10-J43.9). Electronically Signed   By: GJewel BaizeD.  On: 11/20/2020 16:41   CT L-SPINE NO CHARGE  Result Date: 11/20/2020 CLINICAL DATA:  Back pain. EXAM: CT LUMBAR SPINE WITH CONTRAST TECHNIQUE: Technique: Multiplanar CT images of the lumbar spine were reconstructed from contemporary CT of the Abdomen and Pelvis. CONTRAST:  100 mL Omnipaque 300 given for concurrent CT of the abdomen and pelvis COMPARISON:  CT abdomen and pelvis 05/04/2020. CTA chest  10/12/2020. FINDINGS: Segmentation: 5 lumbar type vertebrae. Alignment: Mild lumbar levoscoliosis. Minimal retrolisthesis of L4 on L5. Vertebrae: Chronic L4 compression fracture with severe anterior vertebral body height loss, previously augmented. L1 inferior endplate compression fracture with 25% vertebral body height loss and mild sclerosis along the inferior endplate, new from last month's chest CTA. No suspicious osseous lesion. Paraspinal and other soft tissues: No significant paraspinal soft tissue edema associated with the L1 fracture. Intra-abdominal and pelvic contents reported separately. Disc levels: At L3-4, disc bulging, mild retropulsion of the L4 superior endplate, and facet hypertrophy result in mild spinal stenosis. At L4-5, disc bulging and facet and ligamentum flavum hypertrophy result in borderline spinal stenosis. There is mild-to-moderate neural foraminal stenosis from L2-3 to L5-S1. IMPRESSION: 1. Acute or subacute L1 compression fracture with 25% height loss. 2. Chronic L4 compression fracture. 3. Mild spinal stenosis at L3-4. 4. Mild-to-moderate multilevel neural foraminal stenosis. Electronically Signed   By: Logan Bores M.D.   On: 11/20/2020 16:44      IMPRESSION AND PLAN:  Active Problems:   UTI (urinary tract infection)  1.  UTI with subsequent mild sepsis as manifested by tachycardia and tachypnea. - The patient will be admitted to a medical bed. - We will continue antibiotic therapy with IV Rocephin. - We will follow urine and blood cultures.  2.  Diarrhea. - Abdominal and pelvic CT scan revealed no acute source. - Should be placed on as needed Imodium.  3.  Chronic back pain. - T5 and L1 compression fractures are likely subacute from her previous fall few weeks ago. - Physical therapy consult to be obtained.  4.  Dyslipidemia. - We will continue statin therapy.  5.  Chronic systolic CHF. - We will continue Farxiga and Lasix as well as Toprol-XL.  6.  COPD  without exacerbation. - We will continue her inhalers.  DVT prophylaxis: Lovenox.  Code Status: The patient is DNR/DNI.  Family Communication:  The plan of care was discussed in details with the patient (and family). I answered all questions. The patient agreed to proceed with the above mentioned plan. Further management will depend upon hospital course. Disposition Plan: Back to previous home environment Consults called: none.  All the records are reviewed and case discussed with ED provider.  Status is: Inpatient  Remains inpatient appropriate because:Ongoing active pain requiring inpatient pain management, Ongoing diagnostic testing needed not appropriate for outpatient work up, Unsafe d/c plan, IV treatments appropriate due to intensity of illness or inability to take PO, and Inpatient level of care appropriate due to severity of illness  Dispo: The patient is from: Home              Anticipated d/c is to: Home              Patient currently is not medically stable to d/c.   Difficult to place patient No    TOTAL TIME TAKING CARE OF THIS PATIENT: 55 minutes.    Christel Mormon M.D on 11/21/2020 at 12:07 AM  [facility]   From 7 PM-7 AM, contact night-coverage [email or web address]  CC: Primary care physician; Gayland Curry, MD

## 2020-11-22 ENCOUNTER — Other Ambulatory Visit: Payer: Self-pay | Admitting: *Deleted

## 2020-11-22 DIAGNOSIS — I5023 Acute on chronic systolic (congestive) heart failure: Secondary | ICD-10-CM

## 2020-11-22 DIAGNOSIS — J449 Chronic obstructive pulmonary disease, unspecified: Secondary | ICD-10-CM

## 2020-11-22 DIAGNOSIS — Z515 Encounter for palliative care: Secondary | ICD-10-CM

## 2020-11-22 DIAGNOSIS — K2100 Gastro-esophageal reflux disease with esophagitis, without bleeding: Secondary | ICD-10-CM

## 2020-11-22 DIAGNOSIS — Z7189 Other specified counseling: Secondary | ICD-10-CM

## 2020-11-22 DIAGNOSIS — N3001 Acute cystitis with hematuria: Principal | ICD-10-CM

## 2020-11-22 DIAGNOSIS — I5021 Acute systolic (congestive) heart failure: Secondary | ICD-10-CM

## 2020-11-22 DIAGNOSIS — S32019S Unspecified fracture of first lumbar vertebra, sequela: Secondary | ICD-10-CM

## 2020-11-22 LAB — CBC
HCT: 35.2 % — ABNORMAL LOW (ref 36.0–46.0)
Hemoglobin: 10.8 g/dL — ABNORMAL LOW (ref 12.0–15.0)
MCH: 29.3 pg (ref 26.0–34.0)
MCHC: 30.7 g/dL (ref 30.0–36.0)
MCV: 95.7 fL (ref 80.0–100.0)
Platelets: 358 10*3/uL (ref 150–400)
RBC: 3.68 MIL/uL — ABNORMAL LOW (ref 3.87–5.11)
RDW: 17.4 % — ABNORMAL HIGH (ref 11.5–15.5)
WBC: 5.3 10*3/uL (ref 4.0–10.5)
nRBC: 0 % (ref 0.0–0.2)

## 2020-11-22 LAB — BASIC METABOLIC PANEL
Anion gap: 10 (ref 5–15)
BUN: 16 mg/dL (ref 8–23)
CO2: 27 mmol/L (ref 22–32)
Calcium: 8.2 mg/dL — ABNORMAL LOW (ref 8.9–10.3)
Chloride: 98 mmol/L (ref 98–111)
Creatinine, Ser: 0.75 mg/dL (ref 0.44–1.00)
GFR, Estimated: >60 mL/min (ref >60)
Glucose, Bld: 95 mg/dL (ref 70–99)
Potassium: 3.5 mmol/L (ref 3.5–5.1)
Sodium: 135 mmol/L (ref 135–145)

## 2020-11-22 MED ORDER — OMEPRAZOLE 20 MG PO CPDR: 20.0000 mg | DELAYED_RELEASE_CAPSULE | Freq: Every day | ORAL | 1 refills | Status: AC

## 2020-11-22 MED ORDER — SACUBITRIL-VALSARTAN 24-26 MG PO TABS
1.0000 | ORAL_TABLET | Freq: Two times a day (BID) | ORAL | Status: DC
  Administered 2020-11-22 (×2): 1 via ORAL
  Filled 2020-11-22 (×3): qty 1

## 2020-11-22 NOTE — Progress Notes (Signed)
Patient ID: Roberta [name], female   DOB: April 04, 1946, 75 y.o.   MRN: [id number] [facility] PROGRESS NOTE  Roberta [name]:[id number] DOB: 1945-06-07 DOA: 11/20/2020 PCP: Gayland Curry, MD  HPI/Subjective: Patient feels okay.  No complaints of back pain.  Breathing okay.  No cough.  No shortness of breath.  Admitted with UTI and diarrhea.  Daughter concerned that patient was supposed to have a cardiology work-up as outpatient but has been in the hospital numerous times.  Asked if we can get a cardiology consultation here.  Objective: Vitals:   11/22/20 1345 11/22/20 1556  BP:  100/61  Pulse:  96  Resp:  18  Temp:  97.7 F (36.5 C)  SpO2: 96% 94%    Intake/Output Summary (Last 24 hours) at 11/22/2020 1621 Last data filed at 11/22/2020 1358 Gross per 24 hour  Intake 460.06 ml  Output 2100 ml  Net -1639.94 ml   Filed Weights   11/20/20 1457  Weight: 66.7 kg    ROS: Review of Systems  Respiratory:  Negative for cough and shortness of breath.   Cardiovascular:  Negative for chest pain.  Gastrointestinal:  Negative for abdominal pain, nausea and vomiting.  Musculoskeletal:  Negative for back pain.  Exam: Physical Exam HENT:     Head: Normocephalic.     Mouth/Throat:     Pharynx: No oropharyngeal exudate.  Eyes:     General: Lids are normal.     Conjunctiva/sclera: Conjunctivae normal.  Cardiovascular:     Rate and Rhythm: Normal rate and regular rhythm.     Heart sounds: Normal heart sounds, S1 normal and S2 normal.  Pulmonary:     Breath sounds: Examination of the right-lower field reveals decreased breath sounds and rhonchi. Examination of the left-lower field reveals decreased breath sounds and rhonchi. Decreased breath sounds and rhonchi present. No wheezing or rales.  Abdominal:     Palpations: Abdomen is soft.     Tenderness: There is no abdominal tenderness.  Musculoskeletal:     Right lower leg: No swelling.     Left lower leg: No swelling.   Skin:    General: Skin is warm.     Findings: No rash.  Neurological:     Mental Status: She is alert and oriented to person, place, and time.     Data Reviewed: Basic Metabolic Panel: Recent Labs  Lab 11/20/20 1508 11/21/20 0251 11/22/20 0713  NA 135 134* 135  K 4.2 3.6 3.5  CL 100 98 98  CO2 28 27 27   GLUCOSE 125* 88 95  BUN 21 16 16   CREATININE 0.88 0.82 0.75  CALCIUM 8.2* 8.2* 8.2*  MG 1.9  --   --    Liver Function Tests: Recent Labs  Lab 11/20/20 1508  AST 22  ALT 14  ALKPHOS 178*  BILITOT 0.5  PROT 5.6*  ALBUMIN 2.6*   CBC: Recent Labs  Lab 11/20/20 1508 11/21/20 0251 11/22/20 0713  WBC 8.8 9.0 5.3  NEUTROABS 7.0  --   --   HGB 9.6* 9.9* 10.8*  HCT 31.4* 31.9* 35.2*  MCV 96.0 95.8 95.7  PLT 443* 421* 358    BNP (last 3 results) Recent Labs    10/12/20 1112  BNP 1,331.8*     Recent Results (from the past 240 hour(s))  Urine culture     Status: Abnormal (Preliminary result)   Collection Time: 11/20/20  3:14 PM   Specimen: Urine, Random  Result Value Ref Range Status  Specimen Description   Final    URINE, RANDOM Performed at [facility], [address], [place], [place] [address]    Special Requests   Final    NONE Performed at [facility], [address] [place], [place] [address]    Culture (A)  Final    >=100,000 COLONIES/mL KLEBSIELLA PNEUMONIAE SUSCEPTIBILITIES TO FOLLOW Performed at [facility], [place] [address], [place], [place] [address]    Report Status PENDING  Incomplete  Resp Panel by RT-PCR (Flu A&B, Covid) Nasopharyngeal Swab     Status: None   Collection Time: 11/21/20 12:23 AM   Specimen: Nasopharyngeal Swab; Nasopharyngeal(NP) swabs in vial transport medium  Result Value Ref Range Status   SARS Coronavirus 2 by RT PCR NEGATIVE NEGATIVE Final    Comment: (NOTE) SARS-CoV-2 target nucleic acids are NOT DETECTED.  The SARS-CoV-2 RNA is generally detectable in upper  respiratory specimens during the acute phase of infection. The lowest concentration of SARS-CoV-2 viral copies this assay can detect is 138 copies/mL. A negative result does not preclude SARS-Cov-2 infection and should not be used as the sole basis for treatment or other patient management decisions. A negative result may occur with  improper specimen collection/handling, submission of specimen other than nasopharyngeal swab, presence of viral mutation(s) within the areas targeted by this assay, and inadequate number of viral copies(<138 copies/mL). A negative result must be combined with clinical observations, patient history, and epidemiological information. The expected result is Negative.  Fact Sheet for Patients:  EntrepreneurPulse.com.au  Fact Sheet for Healthcare Providers:  IncredibleEmployment.be  This test is no t yet approved or cleared by the [place] FDA and  has been authorized for detection and/or diagnosis of SARS-CoV-2 by FDA under an Emergency Use Authorization (EUA). This EUA will remain  in effect (meaning this test can be used) for the duration of the COVID-19 declaration under Section 564(b)(1) of the Act, 21 U.S.C.section 360bbb-3(b)(1), unless the authorization is terminated  or revoked sooner.       Influenza A by PCR NEGATIVE NEGATIVE Final   Influenza B by PCR NEGATIVE NEGATIVE Final    Comment: (NOTE) The Xpert Xpress SARS-CoV-2/FLU/RSV plus assay is intended as an aid in the diagnosis of influenza from Nasopharyngeal swab specimens and should not be used as a sole basis for treatment. Nasal washings and aspirates are unacceptable for Xpert Xpress SARS-CoV-2/FLU/RSV testing.  Fact Sheet for Patients: EntrepreneurPulse.com.au  Fact Sheet for Healthcare Providers: IncredibleEmployment.be  This test is not yet approved or cleared by the [place] FDA and has been  authorized for detection and/or diagnosis of SARS-CoV-2 by FDA under an Emergency Use Authorization (EUA). This EUA will remain in effect (meaning this test can be used) for the duration of the COVID-19 declaration under Section 564(b)(1) of the Act, 21 U.S.C. section 360bbb-3(b)(1), unless the authorization is terminated or revoked.  Performed at [facility], [address] [place], [place] [address]   Culture, blood (x 2)     Status: None (Preliminary result)   Collection Time: 11/21/20  2:51 AM   Specimen: BLOOD  Result Value Ref Range Status   Specimen Description BLOOD BLOOD RIGHT HAND  Final   Special Requests   Final    BOTTLES DRAWN AEROBIC AND ANAEROBIC Blood Culture adequate volume   Culture   Final    NO GROWTH 1 DAY Performed at [facility], [address] [place], [place] [address]    Report Status PENDING  Incomplete  Culture, blood (x 2)     Status: None (Preliminary result)   Collection Time: 11/21/20  2:57 AM   Specimen: BLOOD  Result Value Ref Range Status   Specimen Description BLOOD BLOOD RIGHT HAND  Final   Special Requests   Final    BOTTLES DRAWN AEROBIC AND ANAEROBIC Blood Culture adequate volume   Culture   Final    NO GROWTH 1 DAY Performed at [facility], [address] [place], [place] [address]    Report Status PENDING  Incomplete     Studies: MR THORACIC SPINE WO CONTRAST  Result Date: 11/20/2020 CLINICAL DATA:  Initial evaluation for acute low back pain, fracture. EXAM: MRI THORACIC AND LUMBAR SPINE WITHOUT CONTRAST TECHNIQUE: Multiplanar and multiecho pulse sequences of the thoracic and lumbar spine were obtained without intravenous contrast. COMPARISON:  Prior CT from earlier the same day. FINDINGS: MRI THORACIC SPINE FINDINGS Alignment: Straightening of the normal thoracic kyphosis with underlying sigmoid scoliotic curvature. No listhesis. Vertebrae: Acute to subacute compression fracture seen involving the  superior endplate of T5. Associated height loss measures up to 20% without bony retropulsion. This is benign/mechanical in appearance. No other acute or subacute compression fracture within the thoracic spine. Chronic compression deformity with sequelae of prior vertebral augmentation noted at T6. Underlying bone marrow signal intensity within normal limits. No discrete or worrisome osseous lesions. Cord:  Normal signal and morphology. Paraspinal and other soft tissues: Paraspinous soft tissues demonstrate no acute finding. Extensive post treatment changes noted within the right lung, grossly similar to previous CT. Moderate right with small left layering pleural effusions. Disc levels: Ordinary for age multilevel disc desiccation seen throughout the thoracic spine. No significant disc bulge or focal disc herniation. Scattered facet hypertrophy noted within the lower thoracic spine. No significant spinal stenosis. Mild to moderate bilateral foraminal narrowing noted on the right at T2-3, T3-4, T8-9, T9-10, and T10-11, and on the left at T6-7 through T9-10. Changes are largely due to facet spurring. MRI LUMBAR SPINE FINDINGS Segmentation:  Standard. Alignment:  Levo scoliosis.  3 mm retrolisthesis of L4 on L5. Vertebrae: Acute to subacute compression fracture extends through the inferior endplate of L1 with 33% central height loss without bony retropulsion. This is benign/mechanical in appearance. Additionally, there is an acute to subacute to insufficiency type fracture involving the left sacral ala (series 19, image 17), partially visualized. Chronic L4 compression fracture with up to 70% height loss and 4 mm bony retropulsion. Vertebral body height otherwise maintained. Bone marrow signal intensity within normal limits. No discrete or worrisome osseous lesions. No other abnormal marrow edema. Conus medullaris and cauda equina: Conus extends to the L1 level. Conus and cauda equina appear normal. Paraspinal and  other soft tissues: Aortic aneurysm with stent endograft in place, better evaluated on prior CT. Visualized visceral structures otherwise unremarkable. Paraspinous soft tissues demonstrate no other acute finding. Disc levels: L1-2: Disc bulge with disc desiccation. Minimal facet spurring. No spinal stenosis. Foramina remain patent. L2-3: Degenerative intervertebral disc space narrowing with mild diffuse disc bulge and disc desiccation. Mild facet hypertrophy. No spinal stenosis. Foramina remain patent. L3-4: Diffuse disc bulge with disc desiccation. 4 mm bony retropulsion related to the chronic L4 compression fracture. Mild to moderate facet hypertrophy. Resultant mild canal with bilateral lateral recess stenosis. Mild bilateral L3 foraminal narrowing. L4-5: Mild disc bulge with disc desiccation. Moderate facet hypertrophy. No significant spinal stenosis. Moderate left greater than right L4 foraminal narrowing. L5-S1: Disc desiccation with mild  disc bulge. Mild facet hypertrophy. No more than mild narrowing of the lateral recesses bilaterally, slightly worse on the left. Central canal remains patent. No significant foraminal stenosis. IMPRESSION: MRI THORACIC SPINE IMPRESSION: 1. Acute to subacute compression fracture involving the superior endplate of T5 with up to 20% height loss without bony retropulsion. 2. No other acute abnormality within the thoracic spine. 3. Chronic compression deformity with sequelae of prior vertebral augmentation at T6. 4. Moderate right with small left layering pleural effusions, with extensive post radiation changes within the right lung. MR LUMBAR SPINE IMPRESSION: 1. Acute to subacute compression fracture involving the inferior endplate of L1 with 78% central height loss without bony retropulsion. 2. Acute insufficiency fracture involving the left sacral ala. 3. Chronic L4 compression fracture. 4. Underlying multilevel lumbar spondylosis with resultant mild spinal stenosis at L3-4,  with mild to moderate bilateral L3 and L4 foraminal narrowing. Electronically Signed   By: Jeannine Boga M.D.   On: 11/20/2020 22:54   MR LUMBAR SPINE WO CONTRAST  Result Date: 11/20/2020 CLINICAL DATA:  Initial evaluation for acute low back pain, fracture. EXAM: MRI THORACIC AND LUMBAR SPINE WITHOUT CONTRAST TECHNIQUE: Multiplanar and multiecho pulse sequences of the thoracic and lumbar spine were obtained without intravenous contrast. COMPARISON:  Prior CT from earlier the same day. FINDINGS: MRI THORACIC SPINE FINDINGS Alignment: Straightening of the normal thoracic kyphosis with underlying sigmoid scoliotic curvature. No listhesis. Vertebrae: Acute to subacute compression fracture seen involving the superior endplate of T5. Associated height loss measures up to 20% without bony retropulsion. This is benign/mechanical in appearance. No other acute or subacute compression fracture within the thoracic spine. Chronic compression deformity with sequelae of prior vertebral augmentation noted at T6. Underlying bone marrow signal intensity within normal limits. No discrete or worrisome osseous lesions. Cord:  Normal signal and morphology. Paraspinal and other soft tissues: Paraspinous soft tissues demonstrate no acute finding. Extensive post treatment changes noted within the right lung, grossly similar to previous CT. Moderate right with small left layering pleural effusions. Disc levels: Ordinary for age multilevel disc desiccation seen throughout the thoracic spine. No significant disc bulge or focal disc herniation. Scattered facet hypertrophy noted within the lower thoracic spine. No significant spinal stenosis. Mild to moderate bilateral foraminal narrowing noted on the right at T2-3, T3-4, T8-9, T9-10, and T10-11, and on the left at T6-7 through T9-10. Changes are largely due to facet spurring. MRI LUMBAR SPINE FINDINGS Segmentation:  Standard. Alignment:  Levo scoliosis.  3 mm retrolisthesis of L4 on  L5. Vertebrae: Acute to subacute compression fracture extends through the inferior endplate of L1 with 46% central height loss without bony retropulsion. This is benign/mechanical in appearance. Additionally, there is an acute to subacute to insufficiency type fracture involving the left sacral ala (series 19, image 17), partially visualized. Chronic L4 compression fracture with up to 70% height loss and 4 mm bony retropulsion. Vertebral body height otherwise maintained. Bone marrow signal intensity within normal limits. No discrete or worrisome osseous lesions. No other abnormal marrow edema. Conus medullaris and cauda equina: Conus extends to the L1 level. Conus and cauda equina appear normal. Paraspinal and other soft tissues: Aortic aneurysm with stent endograft in place, better evaluated on prior CT. Visualized visceral structures otherwise unremarkable. Paraspinous soft tissues demonstrate no other acute finding. Disc levels: L1-2: Disc bulge with disc desiccation. Minimal facet spurring. No spinal stenosis. Foramina remain patent. L2-3: Degenerative intervertebral disc space narrowing with mild diffuse disc bulge and disc desiccation. Mild facet  hypertrophy. No spinal stenosis. Foramina remain patent. L3-4: Diffuse disc bulge with disc desiccation. 4 mm bony retropulsion related to the chronic L4 compression fracture. Mild to moderate facet hypertrophy. Resultant mild canal with bilateral lateral recess stenosis. Mild bilateral L3 foraminal narrowing. L4-5: Mild disc bulge with disc desiccation. Moderate facet hypertrophy. No significant spinal stenosis. Moderate left greater than right L4 foraminal narrowing. L5-S1: Disc desiccation with mild disc bulge. Mild facet hypertrophy. No more than mild narrowing of the lateral recesses bilaterally, slightly worse on the left. Central canal remains patent. No significant foraminal stenosis. IMPRESSION: MRI THORACIC SPINE IMPRESSION: 1. Acute to subacute compression  fracture involving the superior endplate of T5 with up to 20% height loss without bony retropulsion. 2. No other acute abnormality within the thoracic spine. 3. Chronic compression deformity with sequelae of prior vertebral augmentation at T6. 4. Moderate right with small left layering pleural effusions, with extensive post radiation changes within the right lung. MR LUMBAR SPINE IMPRESSION: 1. Acute to subacute compression fracture involving the inferior endplate of L1 with 81% central height loss without bony retropulsion. 2. Acute insufficiency fracture involving the left sacral ala. 3. Chronic L4 compression fracture. 4. Underlying multilevel lumbar spondylosis with resultant mild spinal stenosis at L3-4, with mild to moderate bilateral L3 and L4 foraminal narrowing. Electronically Signed   By: Jeannine Boga M.D.   On: 11/20/2020 22:54   DG Chest Port 1 View  Result Date: 11/21/2020 CLINICAL DATA:  Short of breath with decreased breath sounds and wheezing. EXAM: PORTABLE CHEST 1 VIEW COMPARISON:  10/24/2020 and older studies. FINDINGS: Cardiac silhouette normal in size. No there is focal opacity projecting over the lower right paratracheal mediastinum and contiguous right hilum, with linear type opacities extending superiorly and laterally, and retraction of the hilum superiorly. Findings are stable consistent with post treatment related scarring. Left hilum and remainder of the mediastinum are unremarkable. Left anterior chest wall Port-A-Cath is stable. Lungs are hyperexpanded, but otherwise clear. No convincing pleural effusion and no pneumothorax. Previous vertebroplasty performed in midthoracic vertebra. No gross acute skeletal abnormality. IMPRESSION: 1. No acute cardiopulmonary disease. 2. Stable post treatment scarring on the right with superior right hilar retraction. 3. Hyperexpanded lungs consistent with underlying COPD. Electronically Signed   By: Lajean Manes M.D.   On: 11/21/2020 15:24     Scheduled Meds:  aspirin EC  81 mg Oral Daily   atorvastatin  10 mg Oral QHS   calcitonin (salmon)  1 spray Alternating Nares Daily   enoxaparin  40 mg Subcutaneous Q24H   furosemide  20 mg Oral Daily   ipratropium-albuterol  3 mL Nebulization TID   metoprolol succinate  25 mg Oral Daily   mometasone-formoterol  2 puff Inhalation BID   nicotine  14 mg Transdermal Daily   pantoprazole  40 mg Oral Daily   sacubitril-valsartan  1 tablet Oral BID   vitamin B-12  1,000 mcg Oral Daily   Continuous Infusions:  cefTRIAXone (ROCEPHIN)  IV Stopped (11/21/20 2354)    Assessment/Plan:  Fluid overload and acute on chronic systolic congestive heart failure.  Last EF 25 to 30%.  Changed over to oral Lasix.  Continue Toprol.  [place] cardiology consultation they added Entresto hopefully blood pressure will be able to handle the Entresto. Acute hypoxic respiratory failure improved. COPD.  Lungs sound better today after standing dose nebulizer. Acute cystitis with hematuria.  Started on Rocephin.  Klebsiella growing out of urine culture.  Sepsis ruled out. Diarrhea resolved with holding  anticonstipation medications. Back pain with compression fracture T5 and L1.  Not having much pain.  Continue Miacalcin nasal spray. Vitamin B12 deficiency on oral B12 Chronic small vessel ischemia seen on prior MRI of the brain.  On aspirin Recent hip fracture and with postoperative seroma Hyperlipidemia unspecified on atorvastatin History of small cell lung cancer by biopsy in 2018, history of breast cancer Appreciate palliative care consultation.  Patient already a DNR     Code Status:     Code Status Orders  (From admission, onward)           Start     Ordered   11/21/20 0003  Do not attempt resuscitation (DNR)  Continuous       Question Answer Comment  In the event of cardiac or respiratory ARREST Do not call a "code blue"   In the event of cardiac or respiratory ARREST Do not perform  Intubation, CPR, defibrillation or ACLS   In the event of cardiac or respiratory ARREST Use medication by any route, position, wound care, and other measures to relive pain and suffering. May use oxygen, suction and manual treatment of airway obstruction as needed for comfort.   Comments CODE STATUS was discussed with patient at the bedside and she wishes to be placed on  a DO NOT RESUSCITATE status.      11/21/20 0002           Code Status History     Date Active Date Inactive Code Status Order ID Comments User Context   10/24/2020 1133 10/30/2020 1927 DNR [id number]  Collier Bullock, MD ED   10/24/2020 0812 10/24/2020 1133 Full Code [id number]  Collier Bullock, MD ED   10/12/2020 1303 10/17/2020 1936 Full Code [id number]  Acheampong, Warnell Bureau, MD ED   09/07/2020 1248 09/09/2020 1954 DNR [id number]  Ivor Costa, MD ED   12/16/2019 1633 12/16/2019 2251 Full Code [id number]  Hessie Knows, MD Inpatient   06/11/2018 1740 06/11/2018 2133 Full Code [id number]  Hessie Knows, MD Inpatient   10/29/2017 0804 10/30/2017 1743 Full Code [id number]  Gladstone Lighter, MD Inpatient   04/28/2017 1146 05/02/2017 1531 DNR [id number]  Nicholes Mango, MD ED   12/06/2016 1117 12/07/2016 1547 Full Code [id number]  Algernon Huxley, MD Inpatient      Advance Directive Documentation    Flowsheet Row Most Recent Value  Type of Advance Directive Healthcare Power of Attorney  Pre-existing out of facility DNR order (yellow form or pink MOST form) --  "MOST" Form in Place? --      Family Communication: Spoke with daughter at the bedside Disposition Plan: Status is: Inpatient  Dispo: The patient is from: Home              Anticipated d/c is to: Rehab to long-term care              Patient currently feeling okay.  Treated for fluid overload yesterday.   Difficult to place patient.  No.  Consultants: Cardiology Palliative care  Time spent: 28 minutes  [place]

## 2020-11-22 NOTE — Progress Notes (Signed)
Patient is stable and no reactions to [employer] vaccine.

## 2020-11-22 NOTE — Consult Note (Signed)
Cardiology Consultation:   Patient ID: Roberta [name]; [id number]; 06/15/1945   Admit date: 11/20/2020 Date of Consult: 11/22/2020  Primary Care Provider: Gayland Curry, MD Primary Cardiologist: Rockey Situ Primary Electrophysiologist:  None   Patient Profile:   Roberta [name] is a 75 y.o. female with a hx of cardiomyopathy of uncertain etiology, lung cancer, breast cancer, moderate aortic stenosis and mitral regurgitation, AAA s/p endovascular repair, COPD, tobacco use quitting in 09/2020, and HLD who is being seen today for the evaluation of cardiomyopathy at the request of Dr. Leslye Peer.  History of Present Illness:   Ms. Hursey previously underwent nuclear stress testing 10/2016, for preoperative cardiac restratification, through [facility] clinic, which showed no evidence of ischemia with an LVEF of 60%.  Following this, she subsequently underwent endovascular repair of her AAA along with aortic, iliac, and right renal artery stenting.  She was admitted to [facility] in 09/2020 with progressive dyspnea and orthopnea with imaging showing pulmonary edema and a right pleural effusion.  Echo showed an EF of 25 to 30%.  High-sensitivity troponin was minimally elevated and flat trending at 52.  She was aggressively diuresed.  GDMT was limited in the setting of relative hypotension.  Outpatient work-up of her cardiomyopathy was recommended.  She was admitted again in late 09/2020 with a mechanical fall and found to have a left subcapital hip fracture.  She was felt to be acceptable risk for surgery with continued plans for outpatient ischemic work-up, and underwent surgical repair of her femoral neck fracture.  She was discharged to rehab  She was readmitted on 11/21/2020 with mild sepsis in the setting of UTI and diarrhea.  CT of the abdomen pelvis showed no acute pathology.  She has been treated with IV Rocephin per internal medicine.  Initial labs showed an albumin of 2.6, alkaline phosphatase  1.78, potassium 4.2, BUN 21, serum creatinine 0.88, Hgb 9.6, PLT 443, magnesium 1.9, COVID and influenza negative.  Blood culture with no growth to date x2 at 1 day.  EKG showed sinus rhythm with LVH.  In the ED she received IV fluids.  On 6/26 she was noted to be hypoxic with O2 saturation of 89% on room air leading to the discontinuation of IV fluids.  She was given a dose of IV Lasix with documented urine output  2.4 L for the past 24 hours and 2.5 L negative for the admission.  Chest x-ray showed no acute cardiopulmonary disease with stable posttreatment scarring on the right with superior right hilar retraction as well as hyperinflation consistent with known COPD.  Currently, feels like she is back to baseline from a respiratory perspective. She has been weaned back to room air.    Past Medical History:  Diagnosis Date   AAA (abdominal aortic aneurysm) ([place])    Anemia    Breast cancer ([place]) 2002   left   Cardiomyopathy ([place])    a. 09/2020 Echo: EF 25-30%, glob HK, Gr2 DD, mod reduced RV fxn. Mod dil LA. Mild to mod MR. Mod AS.   Chronic combined systolic and diastolic CHF (congestive heart failure) ([place])    a. 09/2020 Echo: EF 25-30%, glob HK, gr2 DD.   COPD (chronic obstructive pulmonary disease) ([facility])    Coronary artery calcification seen on CT scan    Dyspnea    Headache    High cholesterol    History of stress test    a. 10/2016 Low risk MV. EF 60%.   Mitral regurgitation  Moderate aortic stenosis    Personal history of chemotherapy    Small cell lung cancer ([place])    Tobacco abuse     Past Surgical History:  Procedure Laterality Date   ABDOMINAL HYSTERECTOMY     ARTERY BIOPSY Right 12/05/2017   Procedure: BIOPSY TEMPORAL ARTERY;  Surgeon: Margaretha Sheffield, MD;  Location: [facility] ORS;  Service: ENT;  Laterality: Right;   ENDOBRONCHIAL ULTRASOUND N/A 02/19/2017   Procedure: ENDOBRONCHIAL ULTRASOUND;  Surgeon: Flora Lipps, MD;  Location: [facility] ORS;  Service: Cardiopulmonary;   Laterality: N/A;   ENDOVASCULAR REPAIR/STENT GRAFT N/A 12/06/2016   Procedure: Endovascular Repair/Stent Graft;  Surgeon: Katha Cabal, MD;  Location: [place] [facility];  Service: Cardiovascular;  Laterality: N/A;   HIP ARTHROPLASTY Left 10/26/2020   Procedure: ARTHROPLASTY BIPOLAR HIP (HEMIARTHROPLASTY);  Surgeon: Renee Harder, MD;  Location: [facility] ORS;  Service: Orthopedics;  Laterality: Left;   IR FLUORO GUIDE PORT INSERTION RIGHT  03/02/2017   KYPHOPLASTY N/A 06/11/2018   Procedure: KYPHOPLASTY L4 BIOPSY WITH RFA;  Surgeon: Hessie Knows, MD;  Location: [facility] ORS;  Service: Orthopedics;  Laterality: N/A;   KYPHOPLASTY N/A 12/16/2019   Procedure: T6 KYPHOPLASTY;  Surgeon: Hessie Knows, MD;  Location: [facility] OR[facility];  Service: Orthopedics;  Laterality: N/A;   MASTECTOMY Left 2002   with sentinel node      Home Meds: Prior to Admission medications   Medication Sig Start Date End Date Taking? Authorizing Provider  acetaminophen (TYLENOL) 325 MG tablet Take 650 mg by mouth every 6 (six) hours as needed for mild pain.     [provider]  albuterol (PROVENTIL HFA;VENTOLIN HFA) 108 (90 Base) MCG/ACT inhaler Inhale 2 puffs into the lungs every 6 (six) hours as needed for wheezing or shortness of breath. 03/23/16   Frederich Cha, MD  aspirin EC 81 MG EC tablet Take 1 tablet (81 mg total) by mouth daily. Swallow whole. 09/09/20   Bonnielee Haff, MD  atorvastatin (LIPITOR) 10 MG tablet Take 10 mg by mouth at bedtime.  08/11/16   [provider]  enoxaparin (LOVENOX) 40 MG/0.4ML injection Inject 0.4 mLs (40 mg total) into the skin daily for 14 days. 10/29/20 11/12/20  Wyvonnia Dusky, MD  fluticasone-salmeterol (ADVAIR HFA) [phone] MCG/ACT inhaler Inhale 2 puffs into the lungs 2 (two) times daily.    [provider]  furosemide (LASIX) 20 MG tablet Take 1 tablet (20 mg total) by mouth every Monday, Wednesday, and Friday. 10/18/20   Ralene Muskrat B, MD  ipratropium  (ATROVENT) 0.02 % nebulizer solution Take 3 mLs by nebulization in the morning, at noon, in the evening, and at bedtime. 10/07/20 10/07/21  [provider]  metoprolol succinate (TOPROL-XL) 25 MG 24 hr tablet Take 1 tablet (25 mg total) by mouth daily. 10/18/20 11/17/20  Sidney Ace, MD  nicotine (NICODERM CQ - DOSED IN MG/24 HOURS) 14 mg/24hr patch Place 1 patch (14 mg total) onto the skin daily. 10/18/20   Sidney Ace, MD  omeprazole (PRILOSEC) 20 MG capsule Take 1 capsule (20 mg total) by mouth daily. 11/22/20   Sindy Guadeloupe, MD  vitamin B-12 (CYANOCOBALAMIN) 1000 MCG tablet Take 1 tablet (1,000 mcg total) by mouth daily. 09/09/20   Bonnielee Haff, MD    Inpatient Medications: Scheduled Meds:  aspirin EC  81 mg Oral Daily   atorvastatin  10 mg Oral QHS   calcitonin (salmon)  1 spray Alternating Nares Daily   enoxaparin  40 mg Subcutaneous Q24H   furosemide  20 mg Oral Daily   ipratropium-albuterol  3 mL Nebulization TID   metoprolol succinate  25 mg Oral Daily   mometasone-formoterol  2 puff Inhalation BID   nicotine  14 mg Transdermal Daily   pantoprazole  40 mg Oral Daily   vitamin B-12  1,000 mcg Oral Daily   Continuous Infusions:  cefTRIAXone (ROCEPHIN)  IV Stopped (11/21/20 2354)   PRN Meds: acetaminophen **OR** acetaminophen, liver oil-zinc oxide, loperamide, magnesium hydroxide, morphine injection, ondansetron **OR** ondansetron (ZOFRAN) IV, traZODone  Allergies:  No Known Allergies  Social History:   Social History   Socioeconomic History   Marital status: Widowed    Spouse name: Not on file   Number of children: Not on file   Years of education: Not on file   Highest education level: Not on file  Occupational History   Not on file  Tobacco Use   Smoking status: Former    Packs/day: 1.50    Years: 50.00    Pack years: 75.00    Types: Cigarettes   Smokeless tobacco: Never  Vaping Use   Vaping Use: Never used  Substance and Sexual  Activity   Alcohol use: No   Drug use: No   Sexual activity: Never  Other Topics Concern   Not on file  Social History Narrative   Not on file   Social Determinants of Health   Financial Resource Strain: Not on file  Food Insecurity: Not on file  Transportation Needs: Not on file  Physical Activity: Not on file  Stress: Not on file  Social Connections: Not on file  Intimate Partner Violence: Not on file     Family History:   Family History  Problem Relation Age of Onset   CAD Mother    Colon cancer Brother    Breast cancer Neg Hx     ROS:  Review of Systems  Constitutional:  Positive for malaise/fatigue. Negative for chills, diaphoresis, fever and weight loss.  HENT:  Negative for congestion.   Eyes:  Negative for discharge and redness.  Respiratory:  Negative for cough, sputum production, shortness of breath and wheezing.   Cardiovascular:  Negative for chest pain, palpitations, orthopnea, claudication, leg swelling and PND.  Gastrointestinal:  Positive for diarrhea. Negative for abdominal pain, blood in stool, constipation, heartburn, nausea and vomiting.  Genitourinary:  Positive for dysuria, frequency and urgency.  Musculoskeletal:  Negative for falls and myalgias.  Skin:  Negative for rash.  Neurological:  Positive for weakness. Negative for dizziness, tingling, tremors, sensory change, speech change, focal weakness and loss of consciousness.  Endo/Heme/Allergies:  Does not bruise/bleed easily.  Psychiatric/Behavioral:  Negative for substance abuse. The patient is not nervous/anxious.   All other systems reviewed and are negative.    Physical Exam/Data:   Vitals:   11/22/20 0522 11/22/20 0742 11/22/20 0747 11/22/20 1124  BP: (!) 139/57  136/79 125/70  Pulse: 79  80 83  Resp: 16  20 20   Temp: 97.8 F (36.6 C)  (!) 97.5 F (36.4 C) 97.7 F (36.5 C)  TempSrc:   Oral   SpO2: 99% 98% 100% 96%  Weight:      Height:        Intake/Output Summary (Last 24  hours) at 11/22/2020 1142 Last data filed at 11/22/2020 1017 Gross per 24 hour  Intake 340.06 ml  Output 2100 ml  Net -1759.94 ml   Filed Weights   11/20/20 1457  Weight: 66.7 kg   Body mass index is 23.02  kg/m.   Physical Exam: General: Well developed, well nourished, in no acute distress. Elderly appearing.  Head: Normocephalic, atraumatic, sclera non-icteric, no xanthomas, nares without discharge.  Neck: Negative for carotid bruits. JVD not elevated. Lungs: Clear bilaterally to auscultation without wheezes, rales, or rhonchi. Breathing is unlabored. Heart: RRR with S1 S2. I/Vi systolic murmur LSB, no rubs, or gallops appreciated. Abdomen: Soft, non-tender, non-distended with normoactive bowel sounds. No hepatomegaly. No rebound/guarding. No obvious abdominal masses. Msk:  Strength and tone appear normal for age. Extremities: No clubbing or cyanosis. No edema. Distal pedal pulses are 2+ and equal bilaterally. Neuro: Alert and oriented X 3. No facial asymmetry. No focal deficit. Moves all extremities spontaneously. Psych:  Responds to questions appropriately with a normal affect.   EKG:  The EKG was personally reviewed and demonstrates: NSR, 93 bpm, LVH, no acute st/t changes Telemetry:  Telemetry was personally reviewed and demonstrates: Not on telemetry  Weights: Filed Weights   11/20/20 1457  Weight: 66.7 kg    Relevant CV Studies:  2D echo 10/13/2020: 1. Left ventricular ejection fraction, by estimation, is 25 to 30%. Left  ventricular ejection fraction by 2D MOD biplane is 29.6 %. The left  ventricle has severely decreased function. The left ventricle demonstrates  global hypokinesis. The left  ventricular internal cavity size was mildly to moderately dilated. Left  ventricular diastolic parameters are consistent with Grade II diastolic  dysfunction (pseudonormalization).   2. Right ventricular systolic function is moderately reduced. The right  ventricular size is  normal.   3. Left atrial size was moderately dilated.   4. The mitral valve is degenerative. Mild to moderate mitral valve  regurgitation. Severe mitral annular calcification.   5. The aortic valve is calcified. Aortic valve regurgitation is not  visualized. Moderate aortic valve stenosis. __________  Nuclear stress test 10/2016 [facility]): No evidence of ischemia, LVEF 60%   Laboratory Data:  Chemistry Recent Labs  Lab 11/20/20 1508 11/21/20 0251 11/22/20 0713  NA 135 134* 135  K 4.2 3.6 3.5  CL 100 98 98  CO2 28 27 27   GLUCOSE 125* 88 95  BUN 21 16 16   CREATININE 0.88 0.82 0.75  CALCIUM 8.2* 8.2* 8.2*  GFRNONAA >60 >60 >60  ANIONGAP 7 9 10     Recent Labs  Lab 11/20/20 1508  PROT 5.6*  ALBUMIN 2.6*  AST 22  ALT 14  ALKPHOS 178*  BILITOT 0.5   Hematology Recent Labs  Lab 11/20/20 1508 11/21/20 0251 11/22/20 0713  WBC 8.8 9.0 5.3  RBC 3.27* 3.33* 3.68*  HGB 9.6* 9.9* 10.8*  HCT 31.4* 31.9* 35.2*  MCV 96.0 95.8 95.7  MCH 29.4 29.7 29.3  MCHC 30.6 31.0 30.7  RDW 17.7* 17.4* 17.4*  PLT 443* 421* 358   Cardiac EnzymesNo results for input(s): TROPONINI in the last 168 hours. No results for input(s): TROPIPOC in the last 168 hours.  BNPNo results for input(s): BNP, PROBNP in the last 168 hours.  DDimer No results for input(s): DDIMER in the last 168 hours.  Radiology/Studies:  MR THORACIC SPINE WO CONTRAST  Result Date: 11/20/2020  IMPRESSION: MRI THORACIC SPINE IMPRESSION: 1. Acute to subacute compression fracture involving the superior endplate of T5 with up to 20% height loss without bony retropulsion. 2. No other acute abnormality within the thoracic spine. 3. Chronic compression deformity with sequelae of prior vertebral augmentation at T6. 4. Moderate right with small left layering pleural effusions, with extensive post radiation changes within the right lung. MR  LUMBAR SPINE IMPRESSION: 1. Acute to subacute compression fracture involving the inferior  endplate of L1 with 67% central height loss without bony retropulsion. 2. Acute insufficiency fracture involving the left sacral ala. 3. Chronic L4 compression fracture. 4. Underlying multilevel lumbar spondylosis with resultant mild spinal stenosis at L3-4, with mild to moderate bilateral L3 and L4 foraminal narrowing. Electronically Signed   By: Jeannine Boga M.D.   On: 11/20/2020 22:54   MR LUMBAR SPINE WO CONTRAST  Result Date: 11/20/2020 IMPRESSION: MRI THORACIC SPINE IMPRESSION: 1. Acute to subacute compression fracture involving the superior endplate of T5 with up to 20% height loss without bony retropulsion. 2. No other acute abnormality within the thoracic spine. 3. Chronic compression deformity with sequelae of prior vertebral augmentation at T6. 4. Moderate right with small left layering pleural effusions, with extensive post radiation changes within the right lung. MR LUMBAR SPINE IMPRESSION: 1. Acute to subacute compression fracture involving the inferior endplate of L1 with 12% central height loss without bony retropulsion. 2. Acute insufficiency fracture involving the left sacral ala. 3. Chronic L4 compression fracture. 4. Underlying multilevel lumbar spondylosis with resultant mild spinal stenosis at L3-4, with mild to moderate bilateral L3 and L4 foraminal narrowing. Electronically Signed   By: Jeannine Boga M.D.   On: 11/20/2020 22:54   CT ABDOMEN PELVIS W CONTRAST  Result Date: 11/20/2020 IMPRESSION: 1. No acute intra-abdominal or pelvic findings. 2. Fluid collection in the superficial soft tissues overlying the LEFT gluteal region and superior LEFT hip measuring 7.0 x 6.6 cm. This may represent a postoperative seroma, early infected collection is also considered. Correlate with any signs of infection in this area, this is immediately deep to the skin surface. 3. Fracture of the LEFT inferior pubic ramus with callus formation compatible with subacute fracture. 4. Linear  sclerosis in the LEFT sacral ala is new compared to prior imaging likely reflecting an insufficiency fracture of the LEFT sacral ala. 5. New loss of height at L1 compatible with interval fracture of the inferior endplate of L1 since May of 2022. 6. Hepatic steatosis. 7. Small bilateral pleural effusions. 8. Similar appearance of aorto bi-iliac endograft. 9. Emphysema and aortic atherosclerosis. Aortic Atherosclerosis (ICD10-I70.0) and Emphysema (ICD10-J43.9). Electronically Signed   By: Zetta Bills M.D.   On: 11/20/2020 16:41   CT L-SPINE NO CHARGE  Result Date: 11/20/2020 IMPRESSION: 1. Acute or subacute L1 compression fracture with 25% height loss. 2. Chronic L4 compression fracture. 3. Mild spinal stenosis at L3-4. 4. Mild-to-moderate multilevel neural foraminal stenosis. Electronically Signed   By: Logan Bores M.D.   On: 11/20/2020 16:44   DG Chest Port 1 View  Result Date: 11/21/2020 IMPRESSION: 1. No acute cardiopulmonary disease. 2. Stable post treatment scarring on the right with superior right hilar retraction. 3. Hyperexpanded lungs consistent with underlying COPD. Electronically Signed   By: Lajean Manes M.D.   On: 11/21/2020 15:24    Assessment and Plan:   1.  Acute on chronic cardiomyopathy: -Uncertain etiology -Previously volume overloaded in the context of IV for sepsis with UTI and diarrhea  -Outpatient ischemic work-up has been delayed due to recurrent hospital admissions -Currently without symptoms concerning for ischemia -GDMT has been limited secondary to relative hypotension, though BP is improved this admission -Continue current GDMT including Toprol-XL and Lasix -SGLT2i has been held upon admission with UTI, resume prior to discharge -BP has been on the high side, add Entresto 24/26 mg bid to further optimize medical therapy -Look to add  MRA prior to discharge or in follow up -No plans for inpatient ischemic evaluation at this time in the context of her acute  illness, outpatient follow up  2. AAA with prior repair of iliac and right renal artery: -Followed by vascular surgery -ASA -Lipitor   For questions or updates, please contact [place] Please consult [email or web address] for contact info under Cardiology/STEMI.   Signed, Christell Faith, PA-C [facility] Pager: [phone] 11/22/2020, 11:42 AM

## 2020-11-22 NOTE — Consult Note (Signed)
Consultation Note Date: 11/22/2020   Patient Name: Roberta [name]  DOB: [birth year]  MRN: [id number]  Age / Sex: 75 y.o., female  PCP: Gayland Curry, MD Referring Physician: Loletha Grayer, MD  Reason for Consultation: Establishing goals of care  HPI/Patient Profile: Roberta [name] is a 75 y.o. Caucasian female with medical history significant for combined systolic diastolic CHF, COPD, coronary artery disease, who recently had a left hip fracture for which she was admitted here on 5/29 and underwent left hemiarthroplasty then was discharged to rehab on 6/5.  She has been there till few days ago when she was discharged home from rehab.  She has been experiencing foul-smelling urine and urinary and stool incontinence with diarrhea.  Clinical Assessment and Goals of Care: Patient is resting in bed. No family at bedside. She is using her phone and states sh is playing on facebook.   She states she was widowed in 1992. She lived with her sister for a while, then with her son and his girlfriend until they were evicted from their home, and then she went to live with her daughter. She states prior to her hip fracture she was independent with her ADL's. She states following her hip repair she went to rehab. She was only home a couple days before this admission. She states she developed diarrhea upon discharge from the SNF.   We discussed her diagnoses, prognosis, GOC, EOL wishes disposition and options.  A detailed discussion was had today regarding advanced directives.  Concepts specific to code status, artifical feeding and hydration, IV antibiotics and rehospitalization were discussed.  The difference between an aggressive medical intervention path and a comfort care path was discussed.  Values and goals of care important to patient and family were attempted to be elicited.  Discussed limitations of medical  interventions to prolong quality of life in some situations and discussed the concept of human mortality.  She states she wants to do what she can to try to live longer for her family. She would like to treat the treatable. She confirms DNR/DNI, no feeding tube. She states she is not really "okay" with the idea of going to a facility to live permanently, but understands her daughter can no longer care for her.   Recommend palliative to follow at D/C.       SUMMARY OF RECOMMENDATIONS   Recommend outpatient palliative.  Prognosis:  Poor overall      Primary Diagnoses: Present on Admission:  UTI (urinary tract infection)   I have reviewed the medical record, interviewed the patient and family, and examined the patient. The following aspects are pertinent.  Past Medical History:  Diagnosis Date   AAA (abdominal aortic aneurysm) ([place])    Anemia    Breast cancer ([place]) 2002   left   Cardiomyopathy ([place])    a. 09/2020 Echo: EF 25-30%, glob HK, Gr2 DD, mod reduced RV fxn. Mod dil LA. Mild to mod MR. Mod AS.   Chronic combined systolic and diastolic CHF (congestive heart failure) ([place])  a. 09/2020 Echo: EF 25-30%, glob HK, gr2 DD.   COPD (chronic obstructive pulmonary disease) ([facility])    Coronary artery calcification seen on CT scan    Dyspnea    Headache    High cholesterol    History of stress test    a. 10/2016 Low risk MV. EF 60%.   Mitral regurgitation    Moderate aortic stenosis    Personal history of chemotherapy    Small cell lung cancer ([place])    Tobacco abuse    Social History   Socioeconomic History   Marital status: Widowed    Spouse name: Not on file   Number of children: Not on file   Years of education: Not on file   Highest education level: Not on file  Occupational History   Not on file  Tobacco Use   Smoking status: Former    Packs/day: 1.50    Years: 50.00    Pack years: 75.00    Types: Cigarettes   Smokeless tobacco: Never  Vaping Use   Vaping  Use: Never used  Substance and Sexual Activity   Alcohol use: No   Drug use: No   Sexual activity: Never  Other Topics Concern   Not on file  Social History Narrative   Not on file   Social Determinants of Health   Financial Resource Strain: Not on file  Food Insecurity: Not on file  Transportation Needs: Not on file  Physical Activity: Not on file  Stress: Not on file  Social Connections: Not on file   Family History  Problem Relation Age of Onset   CAD Mother    Colon cancer Brother    Breast cancer Neg Hx    Scheduled Meds:  aspirin EC  81 mg Oral Daily   atorvastatin  10 mg Oral QHS   calcitonin (salmon)  1 spray Alternating Nares Daily   enoxaparin  40 mg Subcutaneous Q24H   furosemide  20 mg Oral Daily   ipratropium-albuterol  3 mL Nebulization TID   metoprolol succinate  25 mg Oral Daily   mometasone-formoterol  2 puff Inhalation BID   nicotine  14 mg Transdermal Daily   pantoprazole  40 mg Oral Daily   sacubitril-valsartan  1 tablet Oral BID   vitamin B-12  1,000 mcg Oral Daily   Continuous Infusions:  cefTRIAXone (ROCEPHIN)  IV Stopped (11/21/20 2354)   PRN Meds:.acetaminophen **OR** acetaminophen, liver oil-zinc oxide, loperamide, magnesium hydroxide, morphine injection, ondansetron **OR** ondansetron (ZOFRAN) IV, traZODone Medications Prior to Admission:  Prior to Admission medications   Medication Sig Start Date End Date Taking? Authorizing Provider  acetaminophen (TYLENOL) 325 MG tablet Take 650 mg by mouth every 6 (six) hours as needed for mild pain.     [provider]  albuterol (PROVENTIL HFA;VENTOLIN HFA) 108 (90 Base) MCG/ACT inhaler Inhale 2 puffs into the lungs every 6 (six) hours as needed for wheezing or shortness of breath. 03/23/16   Frederich Cha, MD  aspirin EC 81 MG EC tablet Take 1 tablet (81 mg total) by mouth daily. Swallow whole. 09/09/20   Bonnielee Haff, MD  atorvastatin (LIPITOR) 10 MG tablet Take 10 mg by mouth at bedtime.   08/11/16   [provider]  enoxaparin (LOVENOX) 40 MG/0.4ML injection Inject 0.4 mLs (40 mg total) into the skin daily for 14 days. 10/29/20 11/12/20  Wyvonnia Dusky, MD  fluticasone-salmeterol (ADVAIR HFA) [phone] MCG/ACT inhaler Inhale 2 puffs into the lungs 2 (two) times daily.  [provider]  furosemide (LASIX) 20 MG tablet Take 1 tablet (20 mg total) by mouth every Monday, Wednesday, and Friday. 10/18/20   Ralene Muskrat B, MD  ipratropium (ATROVENT) 0.02 % nebulizer solution Take 3 mLs by nebulization in the morning, at noon, in the evening, and at bedtime. 10/07/20 10/07/21  [provider]  metoprolol succinate (TOPROL-XL) 25 MG 24 hr tablet Take 1 tablet (25 mg total) by mouth daily. 10/18/20 11/17/20  Sidney Ace, MD  nicotine (NICODERM CQ - DOSED IN MG/24 HOURS) 14 mg/24hr patch Place 1 patch (14 mg total) onto the skin daily. 10/18/20   Sidney Ace, MD  omeprazole (PRILOSEC) 20 MG capsule Take 1 capsule (20 mg total) by mouth daily. 11/22/20   Sindy Guadeloupe, MD  vitamin B-12 (CYANOCOBALAMIN) 1000 MCG tablet Take 1 tablet (1,000 mcg total) by mouth daily. 09/09/20   Bonnielee Haff, MD   No Known Allergies Review of Systems  All other systems reviewed and are negative.  Physical Exam Pulmonary:     Effort: Pulmonary effort is normal.  Neurological:     Mental Status: She is alert.    Vital Signs: BP 125/70 (BP Location: Right Arm)   Pulse 83   Temp 97.7 F (36.5 C)   Resp 20   Ht 5\' 7"  (1.702 m)   Wt 66.7 kg   SpO2 96%   BMI 23.02 kg/m  Pain Scale: 0-10   Pain Score: 5    SpO2: SpO2: 96 % O2 Device:SpO2: 96 % O2 Flow Rate: .   IO: Intake/output summary:  Intake/Output Summary (Last 24 hours) at 11/22/2020 1451 Last data filed at 11/22/2020 1358 Gross per 24 hour  Intake 460.06 ml  Output 2100 ml  Net -1639.94 ml    LBM: Last BM Date: 11/20/20 Baseline Weight: Weight: 66.7 kg Most recent weight: Weight: 66.7 kg        Time In: 2:15 Time Out: 2:45 Time Total: 30 min Greater than 50%  of this time was spent counseling and coordinating care related to the above assessment and plan.  Signed by: Asencion Gowda, NP   Please contact Palliative Medicine Team phone at [phone] for questions and concerns.  For individual provider: See Shea Evans

## 2020-11-22 NOTE — Progress Notes (Signed)
Physical Therapy Treatment Patient Details Name: Roberta [name] MRN: [id number] DOB: [birth year] Today's Date: 11/22/2020    History of Present Illness Patient is a 75 y.o. Caucasian female with medical history significant for combined systolic diastolic CHF, COPD, coronary artery disease, who recently had a left hip fracture for which she was admitted here on 5/29 and underwent left hemiarthroplasty then was discharged to rehab on 6/5.  She has been there till few days ago when she was discharged home from rehab.  She has been experiencing foul-smelling urine and urinary and stool incontinence with diarrhea.    PT Comments    Patient making progress towards meeting PT goals. Encouragement required for participation. Patient fatigued after walking a short bout in the room with mild shortness of breath noted. Sp02 99% on room air and heart rate 110bpm after walking. No pain is reported. Recommend PT to maximize independence and address functional limitations listed below. Family has concerns about taking patient home and being unable to care for her per chart. Recommend SNF at discharge.     Follow Up Recommendations  SNF     Equipment Recommendations  None recommended by PT    Recommendations for Other Services       Precautions / Restrictions Precautions Precautions: Posterior Hip;Fall Restrictions Weight Bearing Restrictions: Yes LLE Weight Bearing: Weight bearing as tolerated    Mobility  Bed Mobility Overal bed mobility: Needs Assistance Bed Mobility: Supine to Sit;Sit to Supine     Supine to sit: Modified independent (Device/Increase time) Sit to supine: Supervision   General bed mobility comments: increased time required to complete tasks. no pain is reported with activity    Transfers Overall transfer level: Needs assistance Equipment used: Rolling walker (2 wheeled) Transfers: Sit to/from Stand Sit to Stand: Min guard         General transfer comment:  verbal cues for technique and safety. decreased eccentric control with sitting.  Ambulation/Gait Ambulation/Gait assistance: Min guard Gait Distance (Feet): 20 Feet Assistive device: Rolling walker (2 wheeled) Gait Pattern/deviations: Step-to pattern Gait velocity: decreased   General Gait Details: verbal cues to keep rolling walker closer to base of support. patient fatigued with mininmal activity. mild shortness of breath noted with activity, Sp02 99% on room air and heart rate 110bpm after walking   Stairs             Wheelchair Mobility    Modified Rankin (Stroke Patients Only)       Balance Overall balance assessment: Needs assistance Sitting-balance support: No upper extremity supported;Feet supported Sitting balance-Leahy Scale: Good                                      Cognition Arousal/Alertness: Awake/alert Behavior During Therapy: WFL for tasks assessed/performed Overall Cognitive Status: Within Functional Limits for tasks assessed                                        Exercises      General Comments        Pertinent Vitals/Pain Pain Assessment: No/denies pain    Home Living                      Prior Function            PT Goals (current  goals can now be found in the care plan section) Acute Rehab PT Goals Patient Stated Goal: to return home PT Goal Formulation: With patient Time For Goal Achievement: 12/05/20 Potential to Achieve Goals: Good Progress towards PT goals: Progressing toward goals    Frequency    Min 2X/week      PT Plan Current plan remains appropriate    Co-evaluation              AM-PAC PT "6 Clicks" Mobility   Outcome Measure  Help needed turning from your back to your side while in a flat bed without using bedrails?: None Help needed moving from lying on your back to sitting on the side of a flat bed without using bedrails?: A Little Help needed moving to and  from a bed to a chair (including a wheelchair)?: A Little Help needed standing up from a chair using your arms (e.g., wheelchair or bedside chair)?: A Little Help needed to walk in hospital room?: A Little Help needed climbing 3-5 steps with a railing? : A Little 6 Click Score: 19    End of Session Equipment Utilized During Treatment: Gait belt Activity Tolerance: Patient tolerated treatment well Patient left: in bed;with call bell/phone within reach;with bed alarm set Nurse Communication: Mobility status PT Visit Diagnosis: Unsteadiness on feet (R26.81);Muscle weakness (generalized) (M62.81)     Time: 1419-1430 PT Time Calculation (min) (ACUTE ONLY): 11 min  Charges:  $Gait Training: 8-22 mins                     Minna Merritts, PT, MPT    Percell Locus 11/22/2020, 2:52 PM

## 2020-11-22 NOTE — TOC Progression Note (Signed)
Transition of Care [facility]) - Progression Note    Patient Details  Name: Roberta [name] MRN: [id number] Date of Birth: 1946/05/13  Transition of Care [facility]) CM/SW [place], RN Phone Number: 11/22/2020, 12:03 PM  Clinical Narrative:   RNCM in to see patient, daughter at bedside.  Patient came home from [facility] for 2 days.  Daughter unable to care for patient at home, as she reports patient is now incontinent.   Daughter would like to apply for medicaid, Financial Services made aware.  Daughter states she would like patient to go to [facility] or [facility].  TOC contact information given, TOC to follow to discharge.         Expected Discharge Plan and Services                                                 Social Determinants of Health (SDOH) Interventions    Readmission Risk Interventions Readmission Risk Prevention Plan 10/25/2020  Transportation Screening Complete  PCP or Specialist Appt within 5-7 Days Complete  Home Care Screening Complete  Medication Review (RN CM) Complete  Some recent data might be hidden

## 2020-11-23 DIAGNOSIS — I5022 Chronic systolic (congestive) heart failure: Secondary | ICD-10-CM

## 2020-11-23 DIAGNOSIS — E538 Deficiency of other specified B group vitamins: Secondary | ICD-10-CM

## 2020-11-23 DIAGNOSIS — R410 Disorientation, unspecified: Secondary | ICD-10-CM

## 2020-11-23 LAB — BASIC METABOLIC PANEL
Anion gap: 10 (ref 5–15)
BUN: 15 mg/dL (ref 8–23)
CO2: 30 mmol/L (ref 22–32)
Calcium: 8.2 mg/dL — ABNORMAL LOW (ref 8.9–10.3)
Chloride: 93 mmol/L — ABNORMAL LOW (ref 98–111)
Creatinine, Ser: 1.05 mg/dL — ABNORMAL HIGH (ref 0.44–1.00)
GFR, Estimated: 56 mL/min — ABNORMAL LOW (ref >60)
Glucose, Bld: 102 mg/dL — ABNORMAL HIGH (ref 70–99)
Potassium: 3.4 mmol/L — ABNORMAL LOW (ref 3.5–5.1)
Sodium: 133 mmol/L — ABNORMAL LOW (ref 135–145)

## 2020-11-23 LAB — URINE CULTURE: Culture: >=100000 — AB

## 2020-11-23 MED ORDER — FUROSEMIDE 20 MG PO TABS
20.0000 mg | ORAL_TABLET | Freq: Every day | ORAL | Status: DC
  Administered 2020-11-24 – 2020-11-25 (×2): 20 mg via ORAL
  Filled 2020-11-23 (×2): qty 1

## 2020-11-23 MED ORDER — METOPROLOL SUCCINATE ER 50 MG PO TB24
25.0000 mg | ORAL_TABLET | Freq: Every day | ORAL | Status: DC
  Administered 2020-11-24: 12:00:00 25 mg via ORAL
  Filled 2020-11-23: qty 1

## 2020-11-23 MED ORDER — HALOPERIDOL LACTATE 5 MG/ML IJ SOLN: 1.0000 mg | Freq: Four times a day (QID) | INTRAMUSCULAR | Status: DC | PRN

## 2020-11-23 MED ORDER — MAGNESIUM SULFATE 2 GM/50ML IV SOLN
2.0000 g | Freq: Once | INTRAVENOUS | Status: AC
  Administered 2020-11-23: 2 g via INTRAVENOUS
  Filled 2020-11-23: qty 50

## 2020-11-23 MED ORDER — TRAZODONE HCL 50 MG PO TABS
50.0000 mg | ORAL_TABLET | Freq: Every day | ORAL | Status: DC
  Administered 2020-11-23 – 2020-11-24 (×2): 50 mg via ORAL
  Filled 2020-11-23 (×2): qty 1

## 2020-11-23 MED ORDER — KETOROLAC TROMETHAMINE 30 MG/ML IJ SOLN
15.0000 mg | Freq: Once | INTRAMUSCULAR | Status: AC
  Administered 2020-11-23: 12:00:00 15 mg via INTRAVENOUS
  Filled 2020-11-23: qty 1

## 2020-11-23 MED ORDER — SACUBITRIL-VALSARTAN 24-26 MG PO TABS
1.0000 | ORAL_TABLET | Freq: Two times a day (BID) | ORAL | Status: DC
  Filled 2020-11-23: qty 1

## 2020-11-23 MED ORDER — CEPHALEXIN 500 MG PO CAPS
500.0000 mg | ORAL_CAPSULE | Freq: Two times a day (BID) | ORAL | Status: AC
  Administered 2020-11-23 – 2020-11-24 (×4): 500 mg via ORAL
  Filled 2020-11-23 (×4): qty 1

## 2020-11-23 NOTE — Progress Notes (Signed)
   11/23/20 0714  Assess: MEWS Score  Temp (!) 97.2 F (36.2 C)  BP (!) 93/57  Pulse Rate (!) 109  Resp 16  SpO2 100 %  O2 Device Room Air  Assess: MEWS Score  MEWS Temp 0  MEWS Systolic 1  MEWS Pulse 1  MEWS RR 0  MEWS LOC 0  MEWS Score 2  MEWS Score Color Yellow  Assess: if the MEWS score is Yellow or Red  Were vital signs taken at a resting state? Yes  Focused Assessment Change from prior assessment (see assessment flowsheet)  Early Detection of Sepsis Score *See Row Information* Medium  MEWS guidelines implemented *See Row Information* Yes  Take Vital Signs  Increase Vital Sign Frequency  Yellow: Q 2hr X 2 then Q 4hr X 2, if remains yellow, continue Q 4hrs  Escalate  MEWS: Escalate Yellow: discuss with charge nurse/RN and consider discussing with provider and RRT  Notify: Charge Nurse/RN  Name of Charge Nurse/RN Notified Debi  Date Charge Nurse/RN Notified 11/23/20  Time Charge Nurse/RN Notified 0015  Notify: Provider  Provider Name/Title Dr Delfino Lovett  Date Provider Notified 11/23/20  Time Provider Notified [phone]  Notification Reason Change in status  Provider response See new orders  Date of Provider Response 11/23/20  Document  Patient Outcome Stabilized after interventions  Progress note created (see row info) Yes  Assess: SIRS CRITERIA  SIRS Temperature  0  SIRS Pulse 1  SIRS Respirations  0  SIRS WBC 0  SIRS Score Sum  1

## 2020-11-23 NOTE — TOC Progression Note (Addendum)
Transition of Care [facility]) - Progression Note    Patient Details  Name: Roberta [name] MRN: [id number] Date of Birth: [birth year]  Transition of [facility]) CM/SW [place], RN Phone Number: 11/23/2020, 10:16 AM  Clinical Narrative:   Addendum 1557:Leslie from [facility] working with patient's daughter.  Patient will return to [facility] upon medical discharge for LTC.   Magda Paganini from [facility] contacted [employer].  Patient's daughter contacted [facility] for a LTC bed.  Magda Paganini will contact patient's daughter re: Medicaid application at [facility] and LTC admission.  Per Magda Paganini:  Patient is on copay days for short term.  If patient discharges prior to 7/1, family will only have to pay for remaining days in June if they participate in [place] application.  RNCM awaiting response from daughter re: plan for placement.         Expected Discharge Plan and Services                                                 Social Determinants of Health (SDOH) Interventions    Readmission Risk Interventions Readmission Risk Prevention Plan 10/25/2020  Transportation Screening Complete  PCP or Specialist Appt within 5-7 Days Complete  Home Care Screening Complete  Medication Review (RN CM) Complete  Some recent data might be hidden

## 2020-11-23 NOTE — Progress Notes (Signed)
Progress Note  Patient Name: Roberta [name] Date of Encounter: 11/23/2020  Primary Cardiologist: Rockey Situ  Subjective   No chest pain, dyspnea, dizziness, palpitations, presyncope, or syncope. BP soft this morning in the 09N systolic, asymptomatic, improved to the 90s/60s currently. Toprol XL, Entresto, and Lasix have been held. Some confusion this morning, did not sleep overnight.   Inpatient Medications    Scheduled Meds:  aspirin EC  81 mg Oral Daily   atorvastatin  10 mg Oral QHS   calcitonin (salmon)  1 spray Alternating Nares Daily   enoxaparin  40 mg Subcutaneous Q24H   [START ON 11/24/2020] furosemide  20 mg Oral Daily   ipratropium-albuterol  3 mL Nebulization TID   ketorolac  15 mg Intravenous Once   [START ON 11/24/2020] metoprolol succinate  25 mg Oral Daily   mometasone-formoterol  2 puff Inhalation BID   nicotine  14 mg Transdermal Daily   pantoprazole  40 mg Oral Daily   traZODone  50 mg Oral QHS   vitamin B-12  1,000 mcg Oral Daily   Continuous Infusions:  cefTRIAXone (ROCEPHIN)  IV 1 g (11/22/20 2315)   magnesium sulfate bolus IVPB 2 g (11/23/20 0857)   PRN Meds: acetaminophen **OR** acetaminophen, haloperidol lactate, liver oil-zinc oxide, loperamide, magnesium hydroxide, ondansetron **OR** ondansetron (ZOFRAN) IV   Vital Signs    Vitals:   11/23/20 0302 11/23/20 0714 11/23/20 0754 11/23/20 0903  BP: (!) 86/48 (!) 93/57  90/68  Pulse: 90 (!) 109  (!) 108  Resp: 16 16  18   Temp: 98.3 F (36.8 C) (!) 97.2 F (36.2 C)  98.4 F (36.9 C)  TempSrc: Axillary Oral    SpO2: 95% 100% 94% 97%  Weight:      Height:        Intake/Output Summary (Last 24 hours) at 11/23/2020 0904 Last data filed at 11/23/2020 0430 Gross per 24 hour  Intake 700.06 ml  Output 1750 ml  Net -1049.94 ml   Filed Weights   11/20/20 1457  Weight: 66.7 kg    Telemetry    Not on telemetry - Personally Reviewed  ECG    No new tracings - Personally Reviewed  Physical  Exam   GEN: No acute distress.   Neck: No JVD. Cardiac: RRR, II/VI systolic murmur RUSB, no rubs, or gallops.  Respiratory: Clear to auscultation bilaterally.  GI: Soft, nontender, non-distended.   MS: No edema; No deformity. Neuro:  Alert and oriented x 3; Nonfocal.  Psych: Normal affect.  Labs    Chemistry Recent Labs  Lab 11/20/20 1508 11/21/20 0251 11/22/20 0713 11/23/20 0506  NA 135 134* 135 133*  K 4.2 3.6 3.5 3.4*  CL 100 98 98 93*  CO2 28 27 27 30   GLUCOSE 125* 88 95 102*  BUN 21 16 16 15   CREATININE 0.88 0.82 0.75 1.05*  CALCIUM 8.2* 8.2* 8.2* 8.2*  PROT 5.6*  --   --   --   ALBUMIN 2.6*  --   --   --   AST 22  --   --   --   ALT 14  --   --   --   ALKPHOS 178*  --   --   --   BILITOT 0.5  --   --   --   GFRNONAA >60 >60 >60 56*  ANIONGAP 7 9 10 10      Hematology Recent Labs  Lab 11/20/20 1508 11/21/20 0251 11/22/20 0713  WBC 8.8 9.0  5.3  RBC 3.27* 3.33* 3.68*  HGB 9.6* 9.9* 10.8*  HCT 31.4* 31.9* 35.2*  MCV 96.0 95.8 95.7  MCH 29.4 29.7 29.3  MCHC 30.6 31.0 30.7  RDW 17.7* 17.4* 17.4*  PLT 443* 421* 358    Cardiac EnzymesNo results for input(s): TROPONINI in the last 168 hours. No results for input(s): TROPIPOC in the last 168 hours.   BNPNo results for input(s): BNP, PROBNP in the last 168 hours.   DDimer No results for input(s): DDIMER in the last 168 hours.   Radiology    DG Chest Port 1 View  Result Date: 11/21/2020 IMPRESSION: 1. No acute cardiopulmonary disease. 2. Stable post treatment scarring on the right with superior right hilar retraction. 3. Hyperexpanded lungs consistent with underlying COPD. Electronically Signed   By: Lajean Manes M.D.   On: 11/21/2020 15:24    Cardiac Studies   2D echo 10/13/2020: 1. Left ventricular ejection fraction, by estimation, is 25 to 30%. Left  ventricular ejection fraction by 2D MOD biplane is 29.6 %. The left  ventricle has severely decreased function. The left ventricle demonstrates   global hypokinesis. The left  ventricular internal cavity size was mildly to moderately dilated. Left  ventricular diastolic parameters are consistent with Grade II diastolic  dysfunction (pseudonormalization).   2. Right ventricular systolic function is moderately reduced. The right  ventricular size is normal.   3. Left atrial size was moderately dilated.   4. The mitral valve is degenerative. Mild to moderate mitral valve  regurgitation. Severe mitral annular calcification.   5. The aortic valve is calcified. Aortic valve regurgitation is not  visualized. Moderate aortic valve stenosis. __________   Nuclear stress test 10/2016 Jef[facility] Bryant): No evidence of ischemia, LVEF 60%  Patient Profile     75 y.o. female with history of cardiomyopathy of uncertain etiology, lung cancer, breast cancer, moderate aortic stenosis and mitral regurgitation, AAA s/p endovascular repair, COPD, tobacco use quitting in 09/2020, and HLD who is being seen today for the evaluation of cardiomyopathy at the request of Dr. Leslye Peer.  Assessment & Plan    1. Acute on chronic cardiomyopathy: -Uncertain etiology -Previously volume overloaded in the context of IV hydration for sepsis with UTI and diarrhea -Volume status improved following IV diuresis  -Outpatient ischemic work-up has been delayed due to recurrent hospital admissions -Currently without symptoms concerning for ischemia -GDMT has been limited secondary to relative hypotension -She was started on Entresto 6/27, though this, along with Toprol XL and Lasix were held on 6/28 due to hypotension -Resume Toprol XL when able, possibly 6/29 -Look to escalate GDMT in the outpatient setting as able with ARB/MRA/SGLT2i as tolerated, she may not tolerate Entresto -No plans for inpatient ischemic evaluation at this time in the context of her acute illness, outpatient follow up   2. AAA with prior repair of iliac and right renal artery: -Followed by vascular  surgery -ASA -Lipitor  For questions or updates, please contact [place] Please consult [email or web address] for contact info under Cardiology/STEMI.    Signed, Christell Faith, PA-C [place] Pager: [phone] 11/23/2020, 9:04 AM

## 2020-11-23 NOTE — Progress Notes (Signed)
Patient ID: Roberta [name], female   DOB: Jul 20, 1945, 75 y.o.   MRN: [id number] [facility] PROGRESS NOTE  ALIZON SCHMELING KPT:[id number] DOB: 04-13-1946 DOA: 11/20/2020 PCP: Gayland Curry, MD  HPI/Subjective: Called by nursing staff that she had a rough night and did not sleep and pulled off all of her clothing.  When I walked in the room I had to reorient the patient and put her gown back on.  The patient was able to follow all commands when I was in the room.  Blood pressure went too low with cardiology starting Entresto yesterday so this medication was discontinued.  Objective: Vitals:   11/23/20 0903 11/23/20 1109  BP: 90/68 (!) 104/57  Pulse: (!) 108 97  Resp: 18 18  Temp: 98.4 F (36.9 C) 98 F (36.7 C)  SpO2: 97% 96%    Intake/Output Summary (Last 24 hours) at 11/23/2020 1315 Last data filed at 11/23/2020 1042 Gross per 24 hour  Intake 600 ml  Output 1750 ml  Net -1150 ml   Filed Weights   11/20/20 1457  Weight: 66.7 kg    ROS: Review of Systems  Respiratory:  Negative for shortness of breath.   Cardiovascular:  Negative for chest pain.  Gastrointestinal:  Negative for abdominal pain, nausea and vomiting.  Exam: Physical Exam HENT:     Head: Normocephalic.     Mouth/Throat:     Pharynx: No oropharyngeal exudate.  Eyes:     General: Lids are normal.     Conjunctiva/sclera: Conjunctivae normal.     Pupils: Pupils are equal, round, and reactive to light.  Cardiovascular:     Rate and Rhythm: Normal rate and regular rhythm.     Heart sounds: S1 normal and S2 normal. Murmur heard.  Systolic murmur is present with a grade of 2/6.  Pulmonary:     Breath sounds: Examination of the right-lower field reveals decreased breath sounds. Examination of the left-lower field reveals decreased breath sounds. Decreased breath sounds present. No wheezing, rhonchi or rales.  Abdominal:     Palpations: Abdomen is soft.     Tenderness: There is no abdominal  tenderness.  Musculoskeletal:     Right ankle: Swelling present.     Left ankle: Swelling present.  Skin:    General: Skin is warm.     Findings: No rash.  Neurological:     Mental Status: She is alert.     Comments: Able to follow some commands and straight leg raise.     Data Reviewed: Basic Metabolic Panel: Recent Labs  Lab 11/20/20 1508 11/21/20 0251 11/22/20 0713 11/23/20 0506  NA 135 134* 135 133*  K 4.2 3.6 3.5 3.4*  CL 100 98 98 93*  CO2 28 27 27 30   GLUCOSE 125* 88 95 102*  BUN 21 16 16 15   CREATININE 0.88 0.82 0.75 1.05*  CALCIUM 8.2* 8.2* 8.2* 8.2*  MG 1.9  --   --   --    Liver Function Tests: Recent Labs  Lab 11/20/20 1508  AST 22  ALT 14  ALKPHOS 178*  BILITOT 0.5  PROT 5.6*  ALBUMIN 2.6*   CBC: Recent Labs  Lab 11/20/20 1508 11/21/20 0251 11/22/20 0713  WBC 8.8 9.0 5.3  NEUTROABS 7.0  --   --   HGB 9.6* 9.9* 10.8*  HCT 31.4* 31.9* 35.2*  MCV 96.0 95.8 95.7  PLT 443* 421* 358   BNP (last 3 results) Recent Labs    10/12/20 1112  BNP 1,331.8*  Recent Results (from the past 240 hour(s))  Urine culture     Status: Abnormal   Collection Time: 11/20/20  3:14 PM   Specimen: Urine, Random  Result Value Ref Range Status   Specimen Description   Final    URINE, RANDOM Performed at [facility], [address] [place], [place] [address]    Special Requests   Final    NONE Performed at [facility], [place], [place] [address]    Culture >=100,000 COLONIES/mL KLEBSIELLA PNEUMONIAE (A)  Final   Report Status 11/23/2020 FINAL  Final   Organism ID, Bacteria KLEBSIELLA PNEUMONIAE (A)  Final      Susceptibility   Klebsiella pneumoniae - MIC*    AMPICILLIN >=32 RESISTANT Resistant     CEFAZOLIN <=4 SENSITIVE Sensitive     CEFEPIME <=0.12 SENSITIVE Sensitive     CEFTRIAXONE <=0.25 SENSITIVE Sensitive     CIPROFLOXACIN <=0.25 SENSITIVE Sensitive     GENTAMICIN <=1 SENSITIVE Sensitive     IMIPENEM  <=0.25 SENSITIVE Sensitive     NITROFURANTOIN 128 RESISTANT Resistant     TRIMETH/SULFA >=320 RESISTANT Resistant     AMPICILLIN/SULBACTAM 16 INTERMEDIATE Intermediate     PIP/TAZO 16 SENSITIVE Sensitive     * >=100,000 COLONIES/mL KLEBSIELLA PNEUMONIAE  Resp Panel by RT-PCR (Flu A&B, Covid) Nasopharyngeal Swab     Status: None   Collection Time: 11/21/20 12:23 AM   Specimen: Nasopharyngeal Swab; Nasopharyngeal(NP) swabs in vial transport medium  Result Value Ref Range Status   SARS Coronavirus 2 by RT PCR NEGATIVE NEGATIVE Final    Comment: (NOTE) SARS-CoV-2 target nucleic acids are NOT DETECTED.  The SARS-CoV-2 RNA is generally detectable in upper respiratory specimens during the acute phase of infection. The lowest concentration of SARS-CoV-2 viral copies this assay can detect is 138 copies/mL. A negative result does not preclude SARS-Cov-2 infection and should not be used as the sole basis for treatment or other patient management decisions. A negative result may occur with  improper specimen collection/handling, submission of specimen other than nasopharyngeal swab, presence of viral mutation(s) within the areas targeted by this assay, and inadequate number of viral copies(<138 copies/mL). A negative result must be combined with clinical observations, patient history, and epidemiological information. The expected result is Negative.  Fact Sheet for Patients:  EntrepreneurPulse.com.au  Fact Sheet for Healthcare Providers:  IncredibleEmployment.be  This test is no t yet approved or cleared by the [place] FDA and  has been authorized for detection and/or diagnosis of SARS-CoV-2 by FDA under an Emergency Use Authorization (EUA). This EUA will remain  in effect (meaning this test can be used) for the duration of the COVID-19 declaration under Section 564(b)(1) of the Act, 21 U.S.C.section 360bbb-3(b)(1), unless the authorization is  terminated  or revoked sooner.       Influenza A by PCR NEGATIVE NEGATIVE Final   Influenza B by PCR NEGATIVE NEGATIVE Final    Comment: (NOTE) The Xpert Xpress SARS-CoV-2/FLU/RSV plus assay is intended as an aid in the diagnosis of influenza from Nasopharyngeal swab specimens and should not be used as a sole basis for treatment. Nasal washings and aspirates are unacceptable for Xpert Xpress SARS-CoV-2/FLU/RSV testing.  Fact Sheet for Patients: EntrepreneurPulse.com.au  Fact Sheet for Healthcare Providers: IncredibleEmployment.be  This test is not yet approved or cleared by the [place] FDA and has been authorized for detection and/or diagnosis of SARS-CoV-2 by FDA under an Emergency Use Authorization (EUA). This EUA will remain in  effect (meaning this test can be used) for the duration of the COVID-19 declaration under Section 564(b)(1) of the Act, 21 U.S.C. section 360bbb-3(b)(1), unless the authorization is terminated or revoked.  Performed at [facility], [address] [place], [place] [address]   Culture, blood (x 2)     Status: None (Preliminary result)   Collection Time: 11/21/20  2:51 AM   Specimen: BLOOD  Result Value Ref Range Status   Specimen Description BLOOD BLOOD RIGHT HAND  Final   Special Requests   Final    BOTTLES DRAWN AEROBIC AND ANAEROBIC Blood Culture adequate volume   Culture   Final    NO GROWTH 2 DAYS Performed at [facility], [address], [place], [place] [address]    Report Status PENDING  Incomplete  Culture, blood (x 2)     Status: None (Preliminary result)   Collection Time: 11/21/20  2:57 AM   Specimen: BLOOD  Result Value Ref Range Status   Specimen Description BLOOD BLOOD RIGHT HAND  Final   Special Requests   Final    BOTTLES DRAWN AEROBIC AND ANAEROBIC Blood Culture adequate volume   Culture   Final    NO GROWTH 2 DAYS Performed at [facility], [address] [place],  [address]    Report Status PENDING  Incomplete     Studies: DG Chest Port 1 View  Result Date: 11/21/2020 CLINICAL DATA:  Short of breath with decreased breath sounds and wheezing. EXAM: PORTABLE CHEST 1 VIEW COMPARISON:  10/24/2020 and older studies. FINDINGS: Cardiac silhouette normal in size. No there is focal opacity projecting over the lower right paratracheal mediastinum and contiguous right hilum, with linear type opacities extending superiorly and laterally, and retraction of the hilum superiorly. Findings are stable consistent with post treatment related scarring. Left hilum and remainder of the mediastinum are unremarkable. Left anterior chest wall Port-A-Cath is stable. Lungs are hyperexpanded, but otherwise clear. No convincing pleural effusion and no pneumothorax. Previous vertebroplasty performed in midthoracic vertebra. No gross acute skeletal abnormality. IMPRESSION: 1. No acute cardiopulmonary disease. 2. Stable post treatment scarring on the right with superior right hilar retraction. 3. Hyperexpanded lungs consistent with underlying COPD. Electronically Signed   By: Lajean Manes M.D.   On: 11/21/2020 15:24    Scheduled Meds:  aspirin EC  81 mg Oral Daily   atorvastatin  10 mg Oral QHS   calcitonin (salmon)  1 spray Alternating Nares Daily   cephALEXin  500 mg Oral TID   enoxaparin  40 mg Subcutaneous Q24H   [START ON 11/24/2020] furosemide  20 mg Oral Daily   ipratropium-albuterol  3 mL Nebulization TID   [START ON 11/24/2020] metoprolol succinate  25 mg Oral Daily   mometasone-formoterol  2 puff Inhalation BID   nicotine  14 mg Transdermal Daily   pantoprazole  40 mg Oral Daily   traZODone  50 mg Oral QHS   vitamin B-12  1,000 mcg Oral Daily    Brief history.  Patient admitted 11/21/2020 with diarrhea and acute cystitis.  Patient started on antibiotics and medications for constipation were discontinued.  The patient was given fluids initially  and went into fluid overload.  Daughter requested cardiology consultation because they could not get to outpatient appointments.  Patient's daughter is interested in long-term placement.  Last night had acute delirium with not sleeping.  We will need to get her sleeping prior to getting out of the hospital.  Past medical history  of systolic congestive heart failure, COPD, aortic stenosis, history of breast cancer and small cell lung cancer.  Assessment/Plan:  Acute delirium with the patient having some confusion and taking off her close.  Continue to reorient.  Increase trazodone to 50 mg nightly.  As needed Haldol if agitated.  Need to get her sleeping at night. Acute on chronic systolic congestive heart failure.  Last EF 25 to 30%.  Daughter was concerned that patient never mated to outpatient cardiology appointments.  Cardiology tried to start [facility] yesterday but blood pressure went too low and medications had to be held.  Restart low-dose Lasix and Toprol tomorrow.  Entresto discontinued.  Can consider starting low-dose losartan if BP allows. COPD.  After standing dose nebulizer lungs sound better today. Acute hypoxic respiratory failure with one pulse ox of 89%.  This has improved Acute cystitis with hematuria.  Klebsiella growing out of urine culture.  Change Rocephin over to Keflex. Diarrhea had resolved with holding anticonstipation medications Back pain with compression fracture T5 and L1.  Not having much pain.  Miacalcin nasal spray Vitamin B12 deficiency on oral B12 supplementation Chronic small vessel ischemia seen on prior MRI of the brain on aspirin Recent hip fracture with postoperative seroma Hyperlipidemia unspecified on atorvastatin History of small cell lung cancer by biopsy in 2018 and history of breast cancer Appreciate palliative care consultation.  Patient is a DO NOT RESUSCITATE COVID booster given    Code Status:     Code Status Orders  (From admission, onward)            Start     Ordered   11/21/20 0003  Do not attempt resuscitation (DNR)  Continuous       Question Answer Comment  In the event of cardiac or respiratory ARREST Do not call a "code blue"   In the event of cardiac or respiratory ARREST Do not perform Intubation, CPR, defibrillation or ACLS   In the event of cardiac or respiratory ARREST Use medication by any route, position, wound care, and other measures to relive pain and suffering. May use oxygen, suction and manual treatment of airway obstruction as needed for comfort.   Comments CODE STATUS was discussed with patient at the bedside and she wishes to be placed on  a DO NOT RESUSCITATE status.      11/21/20 0002           Code Status History     Date Active Date Inactive Code Status Order ID Comments User Context   10/24/2020 1133 10/30/2020 1927 DNR [id number]  Collier Bullock, MD ED   10/24/2020 0812 10/24/2020 1133 Full Code [id number]  Collier Bullock, MD ED   10/12/2020 1303 10/17/2020 1936 Full Code [id number]  Acheampong, Warnell Bureau, MD ED   09/07/2020 1248 09/09/2020 1954 DNR [id number]  Ivor Costa, MD ED   12/16/2019 1633 12/16/2019 2251 Full Code [id number]  Hessie Knows, MD Inpatient   06/11/2018 1740 06/11/2018 2133 Full Code [id number]  Hessie Knows, MD Inpatient   10/29/2017 0804 10/30/2017 1743 Full Code [id number]  Gladstone Lighter, MD Inpatient   04/28/2017 1146 05/02/2017 1531 DNR [id number]  Nicholes Mango, MD ED   12/06/2016 1117 12/07/2016 1547 Full Code [id number]  Dew, Erskine Squibb, MD Inpatient      Advance Directive Documentation    Flowsheet Row Most Recent Value  Type of Advance Directive Healthcare Power of Attorney  Pre-existing out of facility DNR order (yellow form or pink MOST form) --  "MOST" Form in  Place? --      Family Communication: Spoke with daughter on the phone Disposition Plan: Status is: Inpatient  Dispo: The patient is from: Home              Anticipated d/c is to: Rehab versus long-term care               Patient currently with acute delirium this morning.  We will have to have acute delirium clear prior to disposition.   Difficult to place patient.  Hopefully not  Consultants: Cardiology Palliative care  Antibiotics: Switch Rocephin over to Keflex.  Time spent: 27 minutes  [place]

## 2020-11-24 DIAGNOSIS — R410 Disorientation, unspecified: Secondary | ICD-10-CM

## 2020-11-24 LAB — SARS CORONAVIRUS 2 (TAT 6-24 HRS): SARS Coronavirus 2: NEGATIVE

## 2020-11-24 MED ORDER — MELATONIN 5 MG PO TABS
2.5000 mg | ORAL_TABLET | Freq: Every day | ORAL | Status: DC
  Administered 2020-11-24: 2.5 mg via ORAL
  Filled 2020-11-24 (×2): qty 0.5

## 2020-11-24 NOTE — Care Management Important Message (Signed)
Important Message  Patient Details  Name: Roberta [name] MRN: [id number] Date of Birth: 1945-11-07   Medicare Important Message Given:  Yes  Patient was sleeping so I left a copy on her bedside table for her to review at her convenience.   Juliann Pulse A [place] Study 11/24/2020, 10:41 AM

## 2020-11-24 NOTE — Progress Notes (Signed)
PROGRESS NOTE  Roberta [name]  DOB: [birth year]  PCP: Gayland Curry, MD RFF:[id number]  DOA: 11/20/2020  LOS: 4 days  Hospital Day: 5   Chief Complaint  Patient presents with   Diarrhea    Brief narrative: Roberta [name] is a 75 y.o. female with PMH significant for Cardiomyopathy of uncertain etiology, moderate aortic stenosis, mitral regurgitation, AAA status post endovascular repair, COPD, history of breast cancer and small cell lung cancer. Patient presented to the ED on 6/25 with complaint of abdominal pain, diarrhea and also noted to have cystitis. She was admitted to hospital service and started on antibiotics, IV fluid. During the course of hospital stay, she started becoming fluid overloaded.  Cardiology consultation was obtained.  Medications adjusted.  She also had episodes of intermittent delirium. Currently waiting for placement.  Subjective: Patient was seen and examined this morning.  Pleasant elderly Caucasian female.  Sleeping.  Able to wake up on verbal command.  Knows she is in the hospital.  Speaks a few words.  Not in distress.  Not confused.  Assessment/Plan: Acute diarrhea and abdominal pain -Primary presentation.  Probably related to meds she was taking for constipation.  Clinically improving now after meds were held.  Klebsiella UTI -Currently on Keflex.  5 to 7-day course planned.  Acute on chronic systolic CHF -Last EF 25 to 30%. -Cardiology consultation obtained.  Medication adjustment being done.  Unable to tolerate Entresto because of hypotension. -Noted plan for Lasix and beta-blocker today.  Acute delirium -Intermittent.  Improving on trazodone 50 mg nightly.  As needed Haldol  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 -On aspirin and Lipitor  History of small cell lung cancer by biopsy in 2018 and  history of breast cancer  Mobility: Encourage ambulation.  Long-term treatment plan Code Status:   Code Status: DNR  Nutritional status: Body mass index is 23.02 kg/m.     Diet Order             Diet Heart Room service appropriate? Yes; Fluid consistency: Thin  Diet effective now                   DVT prophylaxis:  enoxaparin (LOVENOX) injection 40 mg Start: 11/21/20 0015   Antimicrobials: Oral Keflex Fluid: None Consultants: Cardiology Family Communication: None at bedside  Status is: Inpatient  Remains inpatient appropriate because: Pending placement  Dispo: The patient is from: Home              Anticipated d/c is to: Long-term placement when bed available              Patient currently is medically stable to d/c.   Difficult to place patient No     Infusions:    Scheduled Meds:  aspirin EC  81 mg Oral Daily   atorvastatin  10 mg Oral QHS   calcitonin (salmon)  1 spray Alternating Nares Daily   cephALEXin  500 mg Oral Q12H   enoxaparin  40 mg Subcutaneous Q24H   furosemide  20 mg Oral Daily   ipratropium-albuterol  3 mL Nebulization TID   melatonin  2.5 mg Oral QHS   metoprolol succinate  25 mg Oral Daily   mometasone-formoterol  2 puff Inhalation BID   nicotine  14 mg Transdermal Daily   pantoprazole  40 mg Oral Daily   traZODone  50 mg Oral QHS  vitamin B-12  1,000 mcg Oral Daily    Antimicrobials: Anti-infectives (From admission, onward)    Start     Dose/Rate Route Frequency Ordered Stop   11/23/20 1330  cephALEXin (KEFLEX) capsule 500 mg        500 mg Oral Every 12 hours 11/23/20 1314     11/21/20 2300  cefTRIAXone (ROCEPHIN) 1 g in sodium chloride 0.9 % 100 mL IVPB  Status:  Discontinued        1 g 200 mL/hr over 30 Minutes Intravenous Every 24 hours 11/21/20 0002 11/21/20 0130   11/21/20 2300  cefTRIAXone (ROCEPHIN) 1 g in sodium chloride 0.9 % 100 mL IVPB  Status:  Discontinued        1 g 200 mL/hr over 30 Minutes Intravenous Every 24  hours 11/21/20 0130 11/23/20 1314   11/20/20 2330  cefTRIAXone (ROCEPHIN) 1 g in sodium chloride 0.9 % 100 mL IVPB        1 g 200 mL/hr over 30 Minutes Intravenous  Once 11/20/20 2320 11/21/20 0007       PRN meds: acetaminophen **OR** acetaminophen, haloperidol lactate, liver oil-zinc oxide, loperamide, magnesium hydroxide, ondansetron **OR** ondansetron (ZOFRAN) IV   Objective: Vitals:   11/24/20 0030 11/24/20 0424  BP: 123/64 (!) 116/59  Pulse: 99 81  Resp: 18 16  Temp: 98 F (36.7 C) 98.7 F (37.1 C)  SpO2: 100% 97%    Intake/Output Summary (Last 24 hours) at 11/24/2020 0915 Last data filed at 11/24/2020 0430 Gross per 24 hour  Intake 480 ml  Output 750 ml  Net -270 ml   Filed Weights   11/20/20 1457  Weight: 66.7 kg   Weight change:  Body mass index is 23.02 kg/m.   Physical Exam: General exam: Pleasant, elderly Caucasian female.  Lying on bed.  Not in distress Skin: No rashes, lesions or ulcers. HEENT: Atraumatic, normocephalic, no obvious bleeding Lungs: Clear to auscultation bilaterally CVS: Regular rate and rhythm, no murmur GI/Abd soft, nontender, nondistended, bowel sound present CNS: Sleeping, able to wake up on verbal command.  Knows he is in the hospital.  Not confused.  Cheerful Psychiatry: Cheerful Extremities: No pedal edema, no calf tenderness  Data Review: I have personally reviewed the laboratory data and studies available.  Recent Labs  Lab 11/20/20 1508 11/21/20 0251 11/22/20 0713  WBC 8.8 9.0 5.3  NEUTROABS 7.0  --   --   HGB 9.6* 9.9* 10.8*  HCT 31.4* 31.9* 35.2*  MCV 96.0 95.8 95.7  PLT 443* 421* 358   Recent Labs  Lab 11/20/20 1508 11/21/20 0251 11/22/20 0713 11/23/20 0506  NA 135 134* 135 133*  K 4.2 3.6 3.5 3.4*  CL 100 98 98 93*  CO2 28 27 27 30   GLUCOSE 125* 88 95 102*  BUN 21 16 16 15   CREATININE 0.88 0.82 0.75 1.05*  CALCIUM 8.2* 8.2* 8.2* 8.2*  MG 1.9  --   --   --     F/u labs ordered. Unresulted Labs  (From admission, onward)     Start     Ordered   11/20/20 1514  C Difficile Quick Screen w PCR reflex  (C Difficile quick screen w PCR reflex panel )  Once, for 24 hours,   STAT       References:    CDiff Information Tool   11/20/20 1513   11/20/20 1513  Gastrointestinal Panel by PCR , Stool  (Gastrointestinal Panel by PCR, Stool                                                                                                                                                     **  Does Not include CLOSTRIDIUM DIFFICILE testing. **If CDIFF testing is needed, place order from the "C Difficile Testing" order set.**)  Once,   STAT        11/20/20 1513            Signed, Terrilee Croak, MD [facility] 11/24/2020

## 2020-11-24 NOTE — TOC Progression Note (Signed)
Transition of Care [facility]) - Progression Note    Patient Details  Name: Roberta [name] MRN: [id number] Date of Birth: Aug 02, 1945  Transition [facility]) CM/SW [place], RN Phone Number: 11/24/2020, 12:42 PM  Clinical Narrative:   Patient will have a bed at [place] for LTC, as per Magda Paganini from [facility].  COVID test ordered, family aware of transfer tomorrow.         Expected Discharge Plan and Services                                                 Social Determinants of Health (SDOH) Interventions    Readmission Risk Interventions Readmission Risk Prevention Plan 11/23/2020 10/25/2020  Transportation Screening Complete Complete  PCP or Specialist Appt within 5-7 Days - Complete  Home Care Screening - Complete  Medication Review (RN CM) - Complete  Medication Review Press photographer) Complete -  PCP or Specialist appointment within 3-5 days of discharge Complete -  [place] or Home Care Consult Complete -  SW Recovery Care/Counseling Consult Not Complete -  SW Consult Not Complete Comments RNCM assigned to case -  Palliative Care Screening Not Applicable -  Comments n/a -  Skilled Nursing Facility Complete -  Some recent data might be hidden

## 2020-11-25 DIAGNOSIS — R197 Diarrhea, unspecified: Secondary | ICD-10-CM

## 2020-11-25 MED ORDER — TRAZODONE HCL 50 MG PO TABS: 50.0000 mg | ORAL_TABLET | Freq: Every day | ORAL | Status: AC

## 2020-11-25 MED ORDER — LOPERAMIDE HCL 2 MG PO CAPS: 2.0000 mg | ORAL_CAPSULE | ORAL | 0 refills | Status: AC | PRN

## 2020-11-25 MED ORDER — MAGNESIUM HYDROXIDE 400 MG/5ML PO SUSP: 30.0000 mL | Freq: Every day | ORAL | 0 refills | Status: AC | PRN

## 2020-11-25 MED ORDER — MELATONIN 5 MG PO TABS: 2.5000 mg | ORAL_TABLET | Freq: Every day | ORAL | 0 refills | Status: AC

## 2020-11-25 NOTE — TOC Progression Note (Signed)
Transition of Care [facility]) - Progression Note    Patient Details  Name: Roberta [name] MRN: [id number] Date of Birth: [birth year]  Transition of Care [facility]) CM/SW [place], RN Phone Number: 11/25/2020, 10:17 AM  Clinical Narrative:   As per Magda Paganini at [facility], patient can go to room 106.  [facility] is able to transport patient at [place] test was negative, patient and family aware.         Expected Discharge Plan and Services           Expected Discharge Date: 11/25/20                                     Social Determinants of Health (SDOH) Interventions    Readmission Risk Interventions Readmission Risk Prevention Plan 11/23/2020 10/25/2020  Transportation Screening Complete Complete  PCP or Specialist Appt within 5-7 Days - Complete  Home Care Screening - Complete  Medication Review (RN CM) - Complete  Medication Review Press photographer) Complete -  PCP or Specialist appointment within 3-5 days of discharge Complete -  [place] or Home Care Consult Complete -  SW Recovery Care/Counseling Consult Not Complete -  SW Consult Not Complete Comments RNCM assigned to case -  Palliative Care Screening Not Applicable -  Comments n/a -  Skilled Nursing Facility Complete -  Some recent data might be hidden

## 2020-11-25 NOTE — Progress Notes (Signed)
Pt to be discharge to [facility]. Report called to [facility].

## 2020-11-25 NOTE — NC FL2 (Signed)
[place] LEVEL OF CARE SCREENING TOOL     IDENTIFICATION  Patient Name: Roberta [name] Birthdate: 1946-02-18 Sex: female Admission Date (Current Location): 11/20/2020  [facility] and [place] Number:  Engineering geologist and Address:  [facility], [address], [place], [place] [address]      Provider Number: [id number]  Attending Physician Name and Address:  Terrilee Croak, MD  Relative Name and Phone Number:  Lesli Albee (Daughter)   [phone] (Mobile)    Current Level of Care: Hospital Recommended Level of Care: [place] Prior Approval Number:    Date Approved/Denied:   PASRR Number: [id number]  Discharge Plan: SNF    Current Diagnoses: Patient Active Problem List   Diagnosis Date Noted   Delirium    B12 deficiency    Acute respiratory failure with hypoxia (HCC)    Hypervolemia    Acute systolic CHF (congestive heart failure) ([place])    Closed fracture of first lumbar vertebra (HCC)    Diarrhea    Anemia due to vitamin B12 deficiency    UTI (urinary tract infection) 11/20/2020   Malnutrition of moderate degree 10/25/2020   Closed left subtrochanteric femur fracture ([place]) 10/24/2020   Chronic HFrEF (heart failure with reduced ejection fraction) ([place])    COPD with acute exacerbation ([place]) 32/99/2426   Acute metabolic encephalopathy 83/41/9622   Elevated troponin 09/07/2020   AKI (acute kidney injury) ([place]) 09/07/2020   GERD (gastroesophageal reflux disease) 09/07/2020   Tobacco abuse 09/07/2020   Malignant neoplasm of unspecified part of unspecified bronchus or lung ([place]) 02/22/2020   Cough 02/12/2020   Rib pain on left side 11/29/2019   Compression fracture of T6 vertebra (HCC) 11/05/2019   Weight loss 07/29/2019   Age-related osteoporosis without current pathological fracture 05/21/2019   Compression fracture of T5 vertebra ([place]) 05/06/2019   Port-A-Cath in place 05/06/2019   Closed stable burst  fracture of fourth lumbar vertebra (HCC) 06/08/2018   Severe protein-calorie malnutrition ([place]) 10/29/2017   Sepsis ([place]) 04/28/2017   Small cell lung cancer, right upper lobe ([place]) 02/23/2017   Goals of care, counseling/discussion 02/23/2017   Lung mass    AAA (abdominal aortic aneurysm) without rupture ([place]) 11/02/2016   COPD (chronic obstructive pulmonary disease) ([place]) 11/02/2016   Hyperlipidemia 11/02/2016   PAD (peripheral artery disease) ([place]) 11/02/2016    Orientation RESPIRATION BLADDER Height & Weight     Self, Time, Situation, Place  Normal External catheter Weight: 66.7 kg Height:  5\' 7"  (170.2 cm)  BEHAVIORAL SYMPTOMS/MOOD NEUROLOGICAL BOWEL NUTRITION STATUS      Continent Diet (Heart Healthy)  AMBULATORY STATUS COMMUNICATION OF NEEDS Skin   Limited Assist Verbally Other (Comment) (1" wound skin tear and bruising r lower arm with dressing)                       Personal Care Assistance Level of Assistance  Bathing, Feeding, Dressing Bathing Assistance: Limited assistance Feeding assistance: Limited assistance Dressing Assistance: Limited assistance     Functional Limitations Info  Sight, Hearing, Speech Sight Info: Impaired Hearing Info: Impaired Speech Info: Impaired    SPECIAL CARE FACTORS FREQUENCY  PT (By licensed PT), OT (By licensed OT)     PT Frequency: min 5x weekly OT Frequency: min 5x weekly            Contractures Contractures Info: Not present    Additional Factors Info  Code Status, Allergies Code Status Info: DNR Allergies Info: No  Know Allergies           Current Medications (11/25/2020):  This is the current hospital active medication list Current Facility-Administered Medications  Medication Dose Route Frequency Provider Last Rate Last Admin   acetaminophen (TYLENOL) tablet 650 mg  650 mg Oral Q6H PRN Mansy, Jan A, MD   650 mg at 11/24/20 1346   Or   acetaminophen (TYLENOL) suppository 650 mg  650 mg Rectal Q6H PRN  Mansy, Jan A, MD       aspirin EC tablet 81 mg  81 mg Oral Daily Mansy, Jan A, MD   81 mg at 11/25/20 0933   atorvastatin (LIPITOR) tablet 10 mg  10 mg Oral QHS Mansy, Jan A, MD   10 mg at 11/24/20 2035   calcitonin (salmon) (MIACALCIN/FORTICAL) nasal spray 1 spray  1 spray Alternating Nares Daily Loletha Grayer, MD   1 spray at 11/24/20 1145   enoxaparin (LOVENOX) injection 40 mg  40 mg Subcutaneous Q24H Mansy, Jan A, MD   40 mg at 11/24/20 2306   furosemide (LASIX) tablet 20 mg  20 mg Oral Daily Loletha Grayer, MD   20 mg at 11/25/20 1448   haloperidol lactate (HALDOL) injection 1 mg  1 mg Intravenous Q6H PRN Loletha Grayer, MD       ipratropium-albuterol (DUONEB) 0.5-2.5 (3) MG/3ML nebulizer solution 3 mL  3 mL Nebulization TID Loletha Grayer, MD   3 mL at 11/25/20 0759   liver oil-zinc oxide (DESITIN) 40 % ointment   Topical PRN Loletha Grayer, MD       loperamide (IMODIUM) capsule 2 mg  2 mg Oral PRN Mansy, Jan A, MD       magnesium hydroxide (MILK OF MAGNESIA) suspension 30 mL  30 mL Oral Daily PRN Mansy, Jan A, MD       melatonin tablet 2.5 mg  2.5 mg Oral QHS Dahal, Binaya, MD   2.5 mg at 11/24/20 2036   mometasone-formoterol (DULERA) 200-5 MCG/ACT inhaler 2 puff  2 puff Inhalation BID Mansy, Jan A, MD   2 puff at 11/25/20 0857   nicotine (NICODERM CQ - dosed in mg/24 hours) patch 14 mg  14 mg Transdermal Daily Mansy, Jan A, MD   14 mg at 11/25/20 0932   ondansetron (ZOFRAN) tablet 4 mg  4 mg Oral Q6H PRN Mansy, Jan A, MD       Or   ondansetron [facility]) injection 4 mg  4 mg Intravenous Q6H PRN Mansy, Jan A, MD       pantoprazole (PROTONIX) EC tablet 40 mg  40 mg Oral Daily Mansy, Jan A, MD   40 mg at 11/25/20 1856   traZODone (DESYREL) tablet 50 mg  50 mg Oral QHS Wieting, Richard, MD   50 mg at 11/24/20 2035   vitamin B-12 (CYANOCOBALAMIN) tablet 1,000 mcg  1,000 mcg Oral Daily Mansy, Jan A, MD   1,000 mcg at 11/25/20 3149   Facility-Administered Medications Ordered in Other  Encounters  Medication Dose Route Frequency Provider Last Rate Last Admin   sodium chloride flush (NS) 0.9 % injection 10 mL  10 mL Intravenous PRN Sindy Guadeloupe, MD   10 mL at 03/26/17 0948   sodium chloride flush (NS) 0.9 % injection 10 mL  10 mL Intravenous PRN Sindy Guadeloupe, MD   10 mL at 04/16/17 1006   sodium chloride flush (NS) 0.9 % injection 10 mL  10 mL Intravenous PRN Sindy Guadeloupe, MD   10 mL  at 01/10/18 1410   sodium chloride flush (NS) 0.9 % injection 10 mL  10 mL Intravenous PRN Lequita Asal, MD   10 mL at 12/11/18 1033     Discharge Medications: Please see discharge summary for a list of discharge medications.  Relevant Imaging Results:  Relevant Lab Results:   Additional Information [id number]  Pete Pelt, RN

## 2020-11-25 NOTE — Discharge Summary (Signed)
Physician Discharge Summary  Branden Vine Thayne ZDG:[id number] DOB: 14-Mar-1946 DOA: 11/20/2020  PCP: Gayland Curry, MD  Admit date: 11/20/2020 Discharge date: 11/25/2020  Admitted From: Home Discharge disposition: Nursing facility -[facility]   Code Status: DNR  Diet Recommendation: Cardiac diet  Discharge Diagnosis:   Active Problems:   Chronic HFrEF (heart failure with reduced ejection fraction) (HCC)   UTI (urinary tract infection)   Acute respiratory failure with hypoxia (HCC)   Hypervolemia   Acute systolic CHF (congestive heart failure) (HCC)   Closed fracture of first lumbar vertebra (HCC)   Diarrhea   Anemia due to vitamin B12 deficiency   Delirium   B12 deficiency    Chief Complaint  Patient presents with   Diarrhea    Brief narrative: Roberta [name] is a 75 y.o. female with PMH significant for Cardiomyopathy of uncertain etiology, moderate aortic stenosis, mitral regurgitation, AAA status post endovascular repair, COPD, history of breast cancer and small cell lung cancer. Patient presented to the ED on 6/25 with complaint of abdominal pain, diarrhea and also noted to have cystitis. She was admitted to hospital service and started on antibiotics, IV fluid. During the course of hospital stay, she started becoming fluid overloaded.  Cardiology consultation was obtained.  Medications adjusted.  She also had episodes of intermittent delirium. Currently improved and ready for discharge to SNF  Subjective: Patient was seen and examined this morning.  Sitting up in bed.  Not in distress.  Nursing staff was taking orthostatic vital signs.  Blood pressure is stable.  No complaint of dizziness.  Hospital course: Acute diarrhea and abdominal pain -Primary presentation.  Probably related to meds she was taking for constipation.  Clinically improving now after meds were held.  Klebsiella UTI -Completed a course of antibiotics.  Acute on chronic systolic  CHF -Last EF 25 to 30%. -Cardiology consultation obtained.  Medication adjustment done.  Unable to tolerate Entresto because of hypotension. -Started on Lasix and beta-blocker yesterday.  Blood pressure dropped down to 80s in the night.  We will hold beta-blocker and continue Lasix only. -To follow-up with cardiology as an outpatient.  Currently [place] on hold.  Acute delirium -Intermittent.  Improving on trazodone 50 mg nightly.    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 -On aspirin and Lipitor  History of small cell lung cancer by biopsy in 2018 and history of breast cancer   Wound care: Incision - 1 Port Other (Comment)  (Active)  Placement Date/Time: 12/05/17 [phone]   Location of Ports: Other (Comment)  Port: (c)     Assessments 12/05/2017  9:03 AM  Port 1 Site Assessment Clean;Dry  Port 1 Drainage Amount None     No Linked orders to display     Incision (Closed) 12/05/17 Face Right (Active)  Date First Assessed/Time First Assessed: 12/05/17 0803   Location: Face  Location Orientation: Right    Assessments 12/05/2017  8:26 AM 12/05/2017  9:03 AM  Dressing Type Liquid skin adhesive Liquid skin adhesive  Dressing Clean;Dry Clean;Dry;Intact  Site / Wound Assessment Clean;Dry Clean;Dry  Closure Skin glue --  Drainage Amount None --     No Linked orders to display     Incision (Closed) 06/11/18 Back (Active)  Date First Assessed/Time First Assessed: 06/11/18 1627   Location: Back    Assessments 06/11/2018  5:24 PM 06/11/2018  5:56 PM  Dressing Type Adhesive bandage Adhesive bandage  Dressing Clean;Dry;Intact Clean;Dry;Intact     No Linked orders to display     Incision (Closed) 10/26/20 Hip Left (Active)  Date First Assessed/Time First Assessed: 10/26/20 1353   Location: Hip  Location Orientation: Left    Assessments  10/26/2020  3:36 PM 11/21/2020  7:44 AM  Dressing Type -- Foam - Lift dressing to assess site every shift  Dressing Clean;Dry;Intact --  Site / Wound Assessment Dressing in place / Unable to assess Clean;Dry  Drainage Amount None None     No Linked orders to display     Wound / Incision (Open or Dehisced) 11/24/20 Skin tear;Other (Comment) Arm Lower;Right;Posterior Skin tear and bruising (Active)  Date First Assessed/Time First Assessed: 11/24/20 0421   Wound Type: Skin tear;Other (Comment)  Location: Arm  Location Orientation: Lower;Right;Posterior  Wound Description (Comments): Skin tear and bruising  Present on Admission: No    Assessments 11/24/2020  4:23 AM 11/24/2020  9:00 PM  Dressing Type -- Transparent dressing  Dressing Status -- Clean;Dry  Wound Width (cm) 0 cm --  Wound Depth (cm) 0 cm --     No Linked orders to display    Discharge Exam:   Vitals:   11/25/20 0803 11/25/20 0906 11/25/20 0911 11/25/20 0921  BP: 104/76 (!) 82/35 (!) 132/59 93/76  Pulse: 80 96 (!) 103 90  Resp: 17     Temp: 98.5 F (36.9 C)     TempSrc:      SpO2: 100% 100%    Weight:      Height:        Body mass index is 23.02 kg/m.  General exam: Pleasant elderly Caucasian female.  Not in distress Skin: No rashes, lesions or ulcers. HEENT: Atraumatic, normocephalic, no obvious bleeding Lungs: Clear to auscultation bilaterally CVS: Regular rate and rhythm, no murmur GI/Abd soft, nontender, nondistended, bowel sound present CNS: Alert, awake, knows she is in the hospital. Psychiatry: Cheerful Extremities: No edema, no calf tenderness  Follow ups:   Discharge Instructions     Diet - low sodium heart healthy   Complete by: As directed    Increase activity slowly   Complete by: As directed    No wound care   Complete by: As directed        Follow-up Information     Gayland Curry, MD Follow up.   Specialty: Family Medicine Contact information: [place]  [id number] [phone]         Minna Merritts, MD .   Specialty: Cardiology Contact information: [place] [address] [phone]                 Recommendations for Outpatient Follow-Up:   Follow-up with PCP, cardiology as an outpatient  Discharge Instructions:  Follow with Primary MD Gayland Curry, MD in 7 days   Get CBC/BMP checked in next visit within 1 week by PCP or SNF MD ( we routinely change or add medications that can affect your baseline labs and fluid status, therefore we recommend that you get the mentioned basic workup next visit with your PCP, your PCP may decide not to get them or add new tests based on their clinical decision)  On your next visit with your PCP, please Get Medicines reviewed and adjusted.  Please request your PCP  to go over all Hospital Tests and Procedure/Radiological results at the follow up, please get all Hospital records sent  to your Prim MD by signing hospital release before you go home.  Activity: As tolerated with Full fall precautions use walker/cane & assistance as needed  For Heart failure patients - Check your Weight same time everyday, if you gain over 2 pounds, or you develop in leg swelling, experience more shortness of breath or chest pain, call your Primary MD immediately. Follow Cardiac Low Salt Diet and 1.5 lit/day fluid restriction.  If you have smoked or chewed Tobacco in the last 2 yrs please stop smoking, stop any regular Alcohol  and or any Recreational drug use.  If you experience worsening of your admission symptoms, develop shortness of breath, life threatening emergency, suicidal or homicidal thoughts you must seek medical attention immediately by calling 911 or calling your MD immediately  if symptoms less severe.  You Must read complete instructions/literature along with all the possible adverse reactions/side effects for all the Medicines you take and that have been  prescribed to you. Take any new Medicines after you have completely understood and accpet all the possible adverse reactions/side effects.   Do not drive, operate heavy machinery, perform activities at heights, swimming or participation in water activities or provide baby sitting services if your were admitted for syncope or siezures until you have seen by Primary MD or a Neurologist and advised to do so again.  Do not drive when taking Pain medications.  Do not take more than prescribed Pain, Sleep and Anxiety Medications  Wear Seat belts while driving.   Please note You were cared for by a hospitalist during your hospital stay. If you have any questions about your discharge medications or the care you received while you were in the hospital after you are discharged, you can call the unit and asked to speak with the hospitalist on call if the hospitalist that took care of you is not available. Once you are discharged, your primary care physician will handle any further medical issues. Please note that NO REFILLS for any discharge medications will be authorized once you are discharged, as it is imperative that you return to your primary care physician (or establish a relationship with a primary care physician if you do not have one) for your aftercare needs so that they can reassess your need for medications and monitor your lab values.    Allergies as of 11/25/2020   No Known Allergies      Medication List     STOP taking these medications    dapagliflozin propanediol 10 MG Tabs tablet Commonly known as: FARXIGA   enoxaparin 40 MG/0.4ML injection Commonly known as: LOVENOX   metoprolol succinate 25 MG 24 hr tablet Commonly known as: TOPROL-XL       TAKE these medications    acetaminophen 500 MG tablet Commonly known as: TYLENOL Take 1,000 mg by mouth every 8 (eight) hours as needed for mild pain.   albuterol 108 (90 Base) MCG/ACT inhaler Commonly known as: VENTOLIN  HFA Inhale 2 puffs into the lungs every 6 (six) hours as needed for wheezing or shortness of breath.   aspirin 81 MG EC tablet Take 1 tablet (81 mg total) by mouth daily. Swallow whole.   atorvastatin 10 MG tablet Commonly known as: LIPITOR Take 10 mg by mouth at bedtime.   fluticasone-salmeterol 115-21 MCG/ACT inhaler Commonly known as: ADVAIR HFA Inhale 2 puffs into the lungs 2 (two) times daily.   furosemide 20 MG tablet Commonly known as: LASIX Take 1 tablet (20 mg total) by mouth every  Monday, Wednesday, and Friday.   ipratropium 0.02 % nebulizer solution Commonly known as: ATROVENT Take 3 mLs by nebulization in the morning, at noon, in the evening, and at bedtime.   loperamide 2 MG capsule Commonly known as: IMODIUM Take 1 capsule (2 mg total) by mouth as needed for diarrhea or loose stools.   magnesium hydroxide 400 MG/5ML suspension Commonly known as: MILK OF MAGNESIA Take 30 mLs by mouth daily as needed for mild constipation.   melatonin 5 MG Tabs Take 0.5 tablets (2.5 mg total) by mouth at bedtime.   nicotine 14 mg/24hr patch Commonly known as: NICODERM CQ - dosed in mg/24 hours Place 1 patch (14 mg total) onto the skin daily.   omeprazole 20 MG capsule Commonly known as: PRILOSEC Take 1 capsule (20 mg total) by mouth daily.   traZODone 50 MG tablet Commonly known as: DESYREL Take 1 tablet (50 mg total) by mouth at bedtime.   vitamin B-12 1000 MCG tablet Commonly known as: CYANOCOBALAMIN Take 1 tablet (1,000 mcg total) by mouth daily.        Time coordinating discharge: 35 minutes  The results of significant diagnostics from this hospitalization (including imaging, microbiology, ancillary and laboratory) are listed below for reference.    Procedures and Diagnostic Studies:   MR THORACIC SPINE WO CONTRAST  Result Date: 11/20/2020 CLINICAL DATA:  Initial evaluation for acute low back pain, fracture. EXAM: MRI THORACIC AND LUMBAR SPINE WITHOUT  CONTRAST TECHNIQUE: Multiplanar and multiecho pulse sequences of the thoracic and lumbar spine were obtained without intravenous contrast. COMPARISON:  Prior CT from earlier the same day. FINDINGS: MRI THORACIC SPINE FINDINGS Alignment: Straightening of the normal thoracic kyphosis with underlying sigmoid scoliotic curvature. No listhesis. Vertebrae: Acute to subacute compression fracture seen involving the superior endplate of T5. Associated height loss measures up to 20% without bony retropulsion. This is benign/mechanical in appearance. No other acute or subacute compression fracture within the thoracic spine. Chronic compression deformity with sequelae of prior vertebral augmentation noted at T6. Underlying bone marrow signal intensity within normal limits. No discrete or worrisome osseous lesions. Cord:  Normal signal and morphology. Paraspinal and other soft tissues: Paraspinous soft tissues demonstrate no acute finding. Extensive post treatment changes noted within the right lung, grossly similar to previous CT. Moderate right with small left layering pleural effusions. Disc levels: Ordinary for age multilevel disc desiccation seen throughout the thoracic spine. No significant disc bulge or focal disc herniation. Scattered facet hypertrophy noted within the lower thoracic spine. No significant spinal stenosis. Mild to moderate bilateral foraminal narrowing noted on the right at T2-3, T3-4, T8-9, T9-10, and T10-11, and on the left at T6-7 through T9-10. Changes are largely due to facet spurring. MRI LUMBAR SPINE FINDINGS Segmentation:  Standard. Alignment:  Levo scoliosis.  3 mm retrolisthesis of L4 on L5. Vertebrae: Acute to subacute compression fracture extends through the inferior endplate of L1 with 93% central height loss without bony retropulsion. This is benign/mechanical in appearance. Additionally, there is an acute to subacute to insufficiency type fracture involving the left sacral ala (series 19,  image 17), partially visualized. Chronic L4 compression fracture with up to 70% height loss and 4 mm bony retropulsion. Vertebral body height otherwise maintained. Bone marrow signal intensity within normal limits. No discrete or worrisome osseous lesions. No other abnormal marrow edema. Conus medullaris and cauda equina: Conus extends to the L1 level. Conus and cauda equina appear normal. Paraspinal and other soft tissues: Aortic aneurysm with stent endograft  in place, better evaluated on prior CT. Visualized visceral structures otherwise unremarkable. Paraspinous soft tissues demonstrate no other acute finding. Disc levels: L1-2: Disc bulge with disc desiccation. Minimal facet spurring. No spinal stenosis. Foramina remain patent. L2-3: Degenerative intervertebral disc space narrowing with mild diffuse disc bulge and disc desiccation. Mild facet hypertrophy. No spinal stenosis. Foramina remain patent. L3-4: Diffuse disc bulge with disc desiccation. 4 mm bony retropulsion related to the chronic L4 compression fracture. Mild to moderate facet hypertrophy. Resultant mild canal with bilateral lateral recess stenosis. Mild bilateral L3 foraminal narrowing. L4-5: Mild disc bulge with disc desiccation. Moderate facet hypertrophy. No significant spinal stenosis. Moderate left greater than right L4 foraminal narrowing. L5-S1: Disc desiccation with mild disc bulge. Mild facet hypertrophy. No more than mild narrowing of the lateral recesses bilaterally, slightly worse on the left. Central canal remains patent. No significant foraminal stenosis. IMPRESSION: MRI THORACIC SPINE IMPRESSION: 1. Acute to subacute compression fracture involving the superior endplate of T5 with up to 20% height loss without bony retropulsion. 2. No other acute abnormality within the thoracic spine. 3. Chronic compression deformity with sequelae of prior vertebral augmentation at T6. 4. Moderate right with small left layering pleural effusions, with  extensive post radiation changes within the right lung. MR LUMBAR SPINE IMPRESSION: 1. Acute to subacute compression fracture involving the inferior endplate of L1 with 25% central height loss without bony retropulsion. 2. Acute insufficiency fracture involving the left sacral ala. 3. Chronic L4 compression fracture. 4. Underlying multilevel lumbar spondylosis with resultant mild spinal stenosis at L3-4, with mild to moderate bilateral L3 and L4 foraminal narrowing. Electronically Signed   By: Jeannine Boga M.D.   On: 11/20/2020 22:54   MR LUMBAR SPINE WO CONTRAST  Result Date: 11/20/2020 CLINICAL DATA:  Initial evaluation for acute low back pain, fracture. EXAM: MRI THORACIC AND LUMBAR SPINE WITHOUT CONTRAST TECHNIQUE: Multiplanar and multiecho pulse sequences of the thoracic and lumbar spine were obtained without intravenous contrast. COMPARISON:  Prior CT from earlier the same day. FINDINGS: MRI THORACIC SPINE FINDINGS Alignment: Straightening of the normal thoracic kyphosis with underlying sigmoid scoliotic curvature. No listhesis. Vertebrae: Acute to subacute compression fracture seen involving the superior endplate of T5. Associated height loss measures up to 20% without bony retropulsion. This is benign/mechanical in appearance. No other acute or subacute compression fracture within the thoracic spine. Chronic compression deformity with sequelae of prior vertebral augmentation noted at T6. Underlying bone marrow signal intensity within normal limits. No discrete or worrisome osseous lesions. Cord:  Normal signal and morphology. Paraspinal and other soft tissues: Paraspinous soft tissues demonstrate no acute finding. Extensive post treatment changes noted within the right lung, grossly similar to previous CT. Moderate right with small left layering pleural effusions. Disc levels: Ordinary for age multilevel disc desiccation seen throughout the thoracic spine. No significant disc bulge or focal disc  herniation. Scattered facet hypertrophy noted within the lower thoracic spine. No significant spinal stenosis. Mild to moderate bilateral foraminal narrowing noted on the right at T2-3, T3-4, T8-9, T9-10, and T10-11, and on the left at T6-7 through T9-10. Changes are largely due to facet spurring. MRI LUMBAR SPINE FINDINGS Segmentation:  Standard. Alignment:  Levo scoliosis.  3 mm retrolisthesis of L4 on L5. Vertebrae: Acute to subacute compression fracture extends through the inferior endplate of L1 with 42% central height loss without bony retropulsion. This is benign/mechanical in appearance. Additionally, there is an acute to subacute to insufficiency type fracture involving the left sacral ala (series  19, image 17), partially visualized. Chronic L4 compression fracture with up to 70% height loss and 4 mm bony retropulsion. Vertebral body height otherwise maintained. Bone marrow signal intensity within normal limits. No discrete or worrisome osseous lesions. No other abnormal marrow edema. Conus medullaris and cauda equina: Conus extends to the L1 level. Conus and cauda equina appear normal. Paraspinal and other soft tissues: Aortic aneurysm with stent endograft in place, better evaluated on prior CT. Visualized visceral structures otherwise unremarkable. Paraspinous soft tissues demonstrate no other acute finding. Disc levels: L1-2: Disc bulge with disc desiccation. Minimal facet spurring. No spinal stenosis. Foramina remain patent. L2-3: Degenerative intervertebral disc space narrowing with mild diffuse disc bulge and disc desiccation. Mild facet hypertrophy. No spinal stenosis. Foramina remain patent. L3-4: Diffuse disc bulge with disc desiccation. 4 mm bony retropulsion related to the chronic L4 compression fracture. Mild to moderate facet hypertrophy. Resultant mild canal with bilateral lateral recess stenosis. Mild bilateral L3 foraminal narrowing. L4-5: Mild disc bulge with disc desiccation. Moderate  facet hypertrophy. No significant spinal stenosis. Moderate left greater than right L4 foraminal narrowing. L5-S1: Disc desiccation with mild disc bulge. Mild facet hypertrophy. No more than mild narrowing of the lateral recesses bilaterally, slightly worse on the left. Central canal remains patent. No significant foraminal stenosis. IMPRESSION: MRI THORACIC SPINE IMPRESSION: 1. Acute to subacute compression fracture involving the superior endplate of T5 with up to 20% height loss without bony retropulsion. 2. No other acute abnormality within the thoracic spine. 3. Chronic compression deformity with sequelae of prior vertebral augmentation at T6. 4. Moderate right with small left layering pleural effusions, with extensive post radiation changes within the right lung. MR LUMBAR SPINE IMPRESSION: 1. Acute to subacute compression fracture involving the inferior endplate of L1 with 12% central height loss without bony retropulsion. 2. Acute insufficiency fracture involving the left sacral ala. 3. Chronic L4 compression fracture. 4. Underlying multilevel lumbar spondylosis with resultant mild spinal stenosis at L3-4, with mild to moderate bilateral L3 and L4 foraminal narrowing. Electronically Signed   By: Jeannine Boga M.D.   On: 11/20/2020 22:54   CT ABDOMEN PELVIS W CONTRAST  Result Date: 11/20/2020 CLINICAL DATA:  Suspected abdominal infection or abscess in a 75 year old female. EXAM: CT ABDOMEN AND PELVIS WITH CONTRAST TECHNIQUE: Multidetector CT imaging of the abdomen and pelvis was performed using the standard protocol following bolus administration of intravenous contrast. CONTRAST:  112mL OMNIPAQUE IOHEXOL 300 MG/ML  SOLN COMPARISON:  May 04, 2020. FINDINGS: Lower chest: Small bilateral pleural effusions. Pulmonary emphysema. Hepatobiliary: Hepatic steatosis. No focal hepatic lesion. The portal vein is patent. No pericholecystic stranding. No biliary duct dilation. Pancreas: Mild pancreatic  atrophy. No focal lesion, inflammation or ductal dilation. Spleen: Normal spleen. Adrenals/Urinary Tract: Normal adrenal glands. Symmetric renal enhancement. No hydronephrosis. Urinary bladder with smooth contours. Stomach/Bowel: No acute gastrointestinal process. Small bowel is normal caliber. The appendix is normal. Stool fills much of the colon. No pericolonic inflammation. Colonic diverticulosis and diverticular disease mainly in the sigmoid colon where this is moderate to marked. No free air. Vascular/Lymphatic: Signs of aorto bi-iliac endograft placement. Similar appearance of the graft and the endo sac which measures approximately 3.3 x 2.9 cm previously 3.5 x 3.1 cm greatest axial dimension. Abdominal vasculature is patent. Smooth contour of the IVC. There is no gastrohepatic or hepatoduodenal ligament lymphadenopathy. No retroperitoneal or mesenteric lymphadenopathy. No pelvic sidewall lymphadenopathy. Reproductive: Unremarkable, no adnexal mass. Other: No free air. Musculoskeletal: Osteopenia. Post LEFT hip arthroplasty. Fluid collection  in the superficial soft tissues overlying the LEFT gluteal region and superior LEFT hip measuring 7.0 x 6.6 cm. Fracture of the LEFT inferior pubic ramus with callus formation compatible with subacute or chronic fracture. Signs of cement augmentation at the L4 level with similar loss of height when compared to previous imaging. New loss of height of L1 along the inferior endplate when compared to imaging from December of 2021, see dedicated lumbar spine obtained on November 21, 2019 as well and reported separately. Linear sclerosis of the LEFT sacral ala new from previous imaging. IMPRESSION: 1. No acute intra-abdominal or pelvic findings. 2. Fluid collection in the superficial soft tissues overlying the LEFT gluteal region and superior LEFT hip measuring 7.0 x 6.6 cm. This may represent a postoperative seroma, early infected collection is also considered. Correlate with any  signs of infection in this area, this is immediately deep to the skin surface. 3. Fracture of the LEFT inferior pubic ramus with callus formation compatible with subacute fracture. 4. Linear sclerosis in the LEFT sacral ala is new compared to prior imaging likely reflecting an insufficiency fracture of the LEFT sacral ala. 5. New loss of height at L1 compatible with interval fracture of the inferior endplate of L1 since May of 2022. 6. Hepatic steatosis. 7. Small bilateral pleural effusions. 8. Similar appearance of aorto bi-iliac endograft. 9. Emphysema and aortic atherosclerosis. Aortic Atherosclerosis (ICD10-I70.0) and Emphysema (ICD10-J43.9). Electronically Signed   By: Zetta Bills M.D.   On: 11/20/2020 16:41   CT L-SPINE NO CHARGE  Result Date: 11/20/2020 CLINICAL DATA:  Back pain. EXAM: CT LUMBAR SPINE WITH CONTRAST TECHNIQUE: Technique: Multiplanar CT images of the lumbar spine were reconstructed from contemporary CT of the Abdomen and Pelvis. CONTRAST:  100 mL Omnipaque 300 given for concurrent CT of the abdomen and pelvis COMPARISON:  CT abdomen and pelvis 05/04/2020. CTA chest 10/12/2020. FINDINGS: Segmentation: 5 lumbar type vertebrae. Alignment: Mild lumbar levoscoliosis. Minimal retrolisthesis of L4 on L5. Vertebrae: Chronic L4 compression fracture with severe anterior vertebral body height loss, previously augmented. L1 inferior endplate compression fracture with 25% vertebral body height loss and mild sclerosis along the inferior endplate, new from last month's chest CTA. No suspicious osseous lesion. Paraspinal and other soft tissues: No significant paraspinal soft tissue edema associated with the L1 fracture. Intra-abdominal and pelvic contents reported separately. Disc levels: At L3-4, disc bulging, mild retropulsion of the L4 superior endplate, and facet hypertrophy result in mild spinal stenosis. At L4-5, disc bulging and facet and ligamentum flavum hypertrophy result in borderline spinal  stenosis. There is mild-to-moderate neural foraminal stenosis from L2-3 to L5-S1. IMPRESSION: 1. Acute or subacute L1 compression fracture with 25% height loss. 2. Chronic L4 compression fracture. 3. Mild spinal stenosis at L3-4. 4. Mild-to-moderate multilevel neural foraminal stenosis. Electronically Signed   By: Logan Bores M.D.   On: 11/20/2020 16:44   DG Chest Port 1 View  Result Date: 11/21/2020 CLINICAL DATA:  Short of breath with decreased breath sounds and wheezing. EXAM: PORTABLE CHEST 1 VIEW COMPARISON:  10/24/2020 and older studies. FINDINGS: Cardiac silhouette normal in size. No there is focal opacity projecting over the lower right paratracheal mediastinum and contiguous right hilum, with linear type opacities extending superiorly and laterally, and retraction of the hilum superiorly. Findings are stable consistent with post treatment related scarring. Left hilum and remainder of the mediastinum are unremarkable. Left anterior chest wall Port-A-Cath is stable. Lungs are hyperexpanded, but otherwise clear. No convincing pleural effusion and no pneumothorax. Previous  vertebroplasty performed in midthoracic vertebra. No gross acute skeletal abnormality. IMPRESSION: 1. No acute cardiopulmonary disease. 2. Stable post treatment scarring on the right with superior right hilar retraction. 3. Hyperexpanded lungs consistent with underlying COPD. Electronically Signed   By: Lajean Manes M.D.   On: 11/21/2020 15:24     Labs:   Basic Metabolic Panel: Recent Labs  Lab 11/20/20 1508 11/21/20 0251 11/22/20 0713 11/23/20 0506  NA 135 134* 135 133*  K 4.2 3.6 3.5 3.4*  CL 100 98 98 93*  CO2 28 27 27 30   GLUCOSE 125* 88 95 102*  BUN 21 16 16 15   CREATININE 0.88 0.82 0.75 1.05*  CALCIUM 8.2* 8.2* 8.2* 8.2*  MG 1.9  --   --   --    GFR Estimated Creatinine Clearance: 45.7 mL/min (A) (by C-G formula based on SCr of 1.05 mg/dL (H)). Liver Function Tests: Recent Labs  Lab 11/20/20 1508  AST  22  ALT 14  ALKPHOS 178*  BILITOT 0.5  PROT 5.6*  ALBUMIN 2.6*   No results for input(s): LIPASE, AMYLASE in the last 168 hours. No results for input(s): AMMONIA in the last 168 hours. Coagulation profile Recent Labs  Lab 11/21/20 0251  INR 1.0    CBC: Recent Labs  Lab 11/20/20 1508 11/21/20 0251 11/22/20 0713  WBC 8.8 9.0 5.3  NEUTROABS 7.0  --   --   HGB 9.6* 9.9* 10.8*  HCT 31.4* 31.9* 35.2*  MCV 96.0 95.8 95.7  PLT 443* 421* 358   Cardiac Enzymes: No results for input(s): CKTOTAL, CKMB, CKMBINDEX, TROPONINI in the last 168 hours. BNP: Invalid input(s): POCBNP CBG: No results for input(s): GLUCAP in the last 168 hours. D-Dimer No results for input(s): DDIMER in the last 72 hours. Hgb A1c No results for input(s): HGBA1C in the last 72 hours. Lipid Profile No results for input(s): CHOL, HDL, LDLCALC, TRIG, CHOLHDL, LDLDIRECT in the last 72 hours. Thyroid function studies No results for input(s): TSH, T4TOTAL, T3FREE, THYROIDAB in the last 72 hours.  Invalid input(s): FREET3 Anemia work up No results for input(s): VITAMINB12, FOLATE, FERRITIN, TIBC, IRON, RETICCTPCT in the last 72 hours. Microbiology Recent Results (from the past 240 hour(s))  Urine culture     Status: Abnormal   Collection Time: 11/20/20  3:14 PM   Specimen: Urine, Random  Result Value Ref Range Status   Specimen Description   Final    URINE, RANDOM Performed at [facility], [address] [place], [place] [address]    Special Requests   Final    NONE Performed at [facility], [place], [place] [address]    Culture >=100,000 COLONIES/mL KLEBSIELLA PNEUMONIAE (A)  Final   Report Status 11/23/2020 FINAL  Final   Organism ID, Bacteria KLEBSIELLA PNEUMONIAE (A)  Final      Susceptibility   Klebsiella pneumoniae - MIC*    AMPICILLIN >=32 RESISTANT Resistant     CEFAZOLIN <=4 SENSITIVE Sensitive     CEFEPIME <=0.12 SENSITIVE Sensitive      CEFTRIAXONE <=0.25 SENSITIVE Sensitive     CIPROFLOXACIN <=0.25 SENSITIVE Sensitive     GENTAMICIN <=1 SENSITIVE Sensitive     IMIPENEM <=0.25 SENSITIVE Sensitive     NITROFURANTOIN 128 RESISTANT Resistant     TRIMETH/SULFA >=320 RESISTANT Resistant     AMPICILLIN/SULBACTAM 16 INTERMEDIATE Intermediate     PIP/TAZO 16 SENSITIVE Sensitive     * >=100,000 COLONIES/mL KLEBSIELLA PNEUMONIAE  Resp Panel by RT-PCR (Flu A&B,  Covid) Nasopharyngeal Swab     Status: None   Collection Time: 11/21/20 12:23 AM   Specimen: Nasopharyngeal Swab; Nasopharyngeal(NP) swabs in vial transport medium  Result Value Ref Range Status   SARS Coronavirus 2 by RT PCR NEGATIVE NEGATIVE Final    Comment: (NOTE) SARS-CoV-2 target nucleic acids are NOT DETECTED.  The SARS-CoV-2 RNA is generally detectable in upper respiratory specimens during the acute phase of infection. The lowest concentration of SARS-CoV-2 viral copies this assay can detect is 138 copies/mL. A negative result does not preclude SARS-Cov-2 infection and should not be used as the sole basis for treatment or other patient management decisions. A negative result may occur with  improper specimen collection/handling, submission of specimen other than nasopharyngeal swab, presence of viral mutation(s) within the areas targeted by this assay, and inadequate number of viral copies(<138 copies/mL). A negative result must be combined with clinical observations, patient history, and epidemiological information. The expected result is Negative.  Fact Sheet for Patients:  EntrepreneurPulse.com.au  Fact Sheet for Healthcare Providers:  IncredibleEmployment.be  This test is no t yet approved or cleared by the [place] FDA and  has been authorized for detection and/or diagnosis of SARS-CoV-2 by FDA under an Emergency Use Authorization (EUA). This EUA will remain  in effect (meaning this test can be used) for the  duration of the COVID-19 declaration under Section 564(b)(1) of the Act, 21 U.S.C.section 360bbb-3(b)(1), unless the authorization is terminated  or revoked sooner.       Influenza A by PCR NEGATIVE NEGATIVE Final   Influenza B by PCR NEGATIVE NEGATIVE Final    Comment: (NOTE) The Xpert Xpress SARS-CoV-2/FLU/RSV plus assay is intended as an aid in the diagnosis of influenza from Nasopharyngeal swab specimens and should not be used as a sole basis for treatment. Nasal washings and aspirates are unacceptable for Xpert Xpress SARS-CoV-2/FLU/RSV testing.  Fact Sheet for Patients: EntrepreneurPulse.com.au  Fact Sheet for Healthcare Providers: IncredibleEmployment.be  This test is not yet approved or cleared by the [place] FDA and has been authorized for detection and/or diagnosis of SARS-CoV-2 by FDA under an Emergency Use Authorization (EUA). This EUA will remain in effect (meaning this test can be used) for the duration of the COVID-19 declaration under Section 564(b)(1) of the Act, 21 U.S.C. section 360bbb-3(b)(1), unless the authorization is terminated or revoked.  Performed at [facility], [address] [place], [place] [address]   Culture, blood (x 2)     Status: None (Preliminary result)   Collection Time: 11/21/20  2:51 AM   Specimen: BLOOD  Result Value Ref Range Status   Specimen Description BLOOD BLOOD RIGHT HAND  Final   Special Requests   Final    BOTTLES DRAWN AEROBIC AND ANAEROBIC Blood Culture adequate volume   Culture   Final    NO GROWTH 4 DAYS Performed at [facility], [address], [place], [place] [address]    Report Status PENDING  Incomplete  Culture, blood (x 2)     Status: None (Preliminary result)   Collection Time: 11/21/20  2:57 AM   Specimen: BLOOD  Result Value Ref Range Status   Specimen Description BLOOD BLOOD RIGHT HAND  Final   Special Requests   Final    BOTTLES DRAWN  AEROBIC AND ANAEROBIC Blood Culture adequate volume   Culture   Final    NO GROWTH 4 DAYS Performed at [facility], [address] [place],  [address]    Report  Status PENDING  Incomplete  SARS CORONAVIRUS 2 (TAT 6-24 HRS) Nasopharyngeal Nasopharyngeal Swab     Status: None   Collection Time: 11/24/20 12:50 PM   Specimen: Nasopharyngeal Swab  Result Value Ref Range Status   SARS Coronavirus 2 NEGATIVE NEGATIVE Final    Comment: (NOTE) SARS-CoV-2 target nucleic acids are NOT DETECTED.  The SARS-CoV-2 RNA is generally detectable in upper and lower respiratory specimens during the acute phase of infection. Negative results do not preclude SARS-CoV-2 infection, do not rule out co-infections with other pathogens, and should not be used as the sole basis for treatment or other patient management decisions. Negative results must be combined with clinical observations, patient history, and epidemiological information. The expected result is Negative.  Fact Sheet for Patients: SugarRoll.be  Fact Sheet for Healthcare Providers: https://www.woods-mathews.com/  This test is not yet approved or cleared by the [place] FDA and  has been authorized for detection and/or diagnosis of SARS-CoV-2 by FDA under an Emergency Use Authorization (EUA). This EUA will remain  in effect (meaning this test can be used) for the duration of the COVID-19 declaration under Se ction 564(b)(1) of the Act, 21 U.S.C. section 360bbb-3(b)(1), unless the authorization is terminated or revoked sooner.  Performed at [facility], [place] [address] [place], [place] [address]      Signed: Terrilee Croak  [facility] 11/25/2020, 10:01 AM

## 2020-11-25 NOTE — NC FL2 (Signed)
[place] LEVEL OF CARE SCREENING TOOL     IDENTIFICATION  Patient Name: Roberta [name] Birthdate: September 08, 1945 Sex: female Admission Date (Current Location): 11/20/2020  [facility] and [place] Number:  Engineering geologist and Address:  [facility], [address], [place], [place] [address]      Provider Number: [id number]  Attending Physician Name and Address:  Terrilee Croak, MD  Relative Name and Phone Number:  Lesli Albee (Daughter)   [phone] (Mobile)    Current Level of Care: Hospital Recommended Level of Care: [place] Prior Approval Number:    Date Approved/Denied:   PASRR Number: [id number]  Discharge Plan: SNF    Current Diagnoses: Patient Active Problem List   Diagnosis Date Noted   Delirium    B12 deficiency    Acute respiratory failure with hypoxia (HCC)    Hypervolemia    Acute systolic CHF (congestive heart failure) ([place])    Closed fracture of first lumbar vertebra (HCC)    Diarrhea    Anemia due to vitamin B12 deficiency    UTI (urinary tract infection) 11/20/2020   Malnutrition of moderate degree 10/25/2020   Closed left subtrochanteric femur fracture ([place]) 10/24/2020   Chronic HFrEF (heart failure with reduced ejection fraction) ([place])    COPD with acute exacerbation ([place]) 22/06/5425   Acute metabolic encephalopathy 11/19/7626   Elevated troponin 09/07/2020   AKI (acute kidney injury) ([place]) 09/07/2020   GERD (gastroesophageal reflux disease) 09/07/2020   Tobacco abuse 09/07/2020   Malignant neoplasm of unspecified part of unspecified bronchus or lung ([place]) 02/22/2020   Cough 02/12/2020   Rib pain on left side 11/29/2019   Compression fracture of T6 vertebra (HCC) 11/05/2019   Weight loss 07/29/2019   Age-related osteoporosis without current pathological fracture 05/21/2019   Compression fracture of T5 vertebra ([place]) 05/06/2019   Port-A-Cath in place 05/06/2019   Closed stable burst  fracture of fourth lumbar vertebra (HCC) 06/08/2018   Severe protein-calorie malnutrition ([place]) 10/29/2017   Sepsis ([place]) 04/28/2017   Small cell lung cancer, right upper lobe ([place]) 02/23/2017   Goals of care, counseling/discussion 02/23/2017   Lung mass    AAA (abdominal aortic aneurysm) without rupture ([place]) 11/02/2016   COPD (chronic obstructive pulmonary disease) ([place]) 11/02/2016   Hyperlipidemia 11/02/2016   PAD (peripheral artery disease) ([place]) 11/02/2016    Orientation RESPIRATION BLADDER Height & Weight     Self, Time, Situation, Place  Normal External catheter Weight: 66.7 kg Height:  5\' 7"  (170.2 cm)  BEHAVIORAL SYMPTOMS/MOOD NEUROLOGICAL BOWEL NUTRITION STATUS      Continent Diet (Heart Healthy)  AMBULATORY STATUS COMMUNICATION OF NEEDS Skin   Limited Assist Verbally Other (Comment) (1" wound skin tear and bruising r lower arm with dressing)                       Personal Care Assistance Level of Assistance  Bathing, Feeding, Dressing Bathing Assistance: Limited assistance Feeding assistance: Limited assistance Dressing Assistance: Limited assistance     Functional Limitations Info  Sight, Hearing, Speech Sight Info: Impaired Hearing Info: Impaired Speech Info: Impaired    SPECIAL CARE FACTORS FREQUENCY  PT (By licensed PT), OT (By licensed OT)     PT Frequency: min 5x weekly OT Frequency: min 5x weekly            Contractures Contractures Info: Not present    Additional Factors Info  Code Status, Allergies Code Status Info: DNR Allergies Info: No  Know Allergies           Current Medications (11/25/2020):  This is the current hospital active medication list Current Facility-Administered Medications  Medication Dose Route Frequency Provider Last Rate Last Admin   acetaminophen (TYLENOL) tablet 650 mg  650 mg Oral Q6H PRN Mansy, Jan A, MD   650 mg at 11/24/20 1346   Or   acetaminophen (TYLENOL) suppository 650 mg  650 mg Rectal Q6H PRN  Mansy, Jan A, MD       aspirin EC tablet 81 mg  81 mg Oral Daily Mansy, Jan A, MD   81 mg at 11/25/20 0933   atorvastatin (LIPITOR) tablet 10 mg  10 mg Oral QHS Mansy, Jan A, MD   10 mg at 11/24/20 2035   calcitonin (salmon) (MIACALCIN/FORTICAL) nasal spray 1 spray  1 spray Alternating Nares Daily Loletha Grayer, MD   1 spray at 11/24/20 1145   enoxaparin (LOVENOX) injection 40 mg  40 mg Subcutaneous Q24H Mansy, Jan A, MD   40 mg at 11/24/20 2306   furosemide (LASIX) tablet 20 mg  20 mg Oral Daily Loletha Grayer, MD   20 mg at 11/25/20 3382   haloperidol lactate (HALDOL) injection 1 mg  1 mg Intravenous Q6H PRN Loletha Grayer, MD       ipratropium-albuterol (DUONEB) 0.5-2.5 (3) MG/3ML nebulizer solution 3 mL  3 mL Nebulization TID Loletha Grayer, MD   3 mL at 11/25/20 0759   liver oil-zinc oxide (DESITIN) 40 % ointment   Topical PRN Loletha Grayer, MD       loperamide (IMODIUM) capsule 2 mg  2 mg Oral PRN Mansy, Jan A, MD       magnesium hydroxide (MILK OF MAGNESIA) suspension 30 mL  30 mL Oral Daily PRN Mansy, Jan A, MD       melatonin tablet 2.5 mg  2.5 mg Oral QHS Dahal, Binaya, MD   2.5 mg at 11/24/20 2036   mometasone-formoterol (DULERA) 200-5 MCG/ACT inhaler 2 puff  2 puff Inhalation BID Mansy, Jan A, MD   2 puff at 11/25/20 0857   nicotine (NICODERM CQ - dosed in mg/24 hours) patch 14 mg  14 mg Transdermal Daily Mansy, Jan A, MD   14 mg at 11/25/20 0932   ondansetron (ZOFRAN) tablet 4 mg  4 mg Oral Q6H PRN Mansy, Jan A, MD       Or   ondansetron [facility]) injection 4 mg  4 mg Intravenous Q6H PRN Mansy, Jan A, MD       pantoprazole (PROTONIX) EC tablet 40 mg  40 mg Oral Daily Mansy, Jan A, MD   40 mg at 11/25/20 5053   traZODone (DESYREL) tablet 50 mg  50 mg Oral QHS Wieting, Richard, MD   50 mg at 11/24/20 2035   vitamin B-12 (CYANOCOBALAMIN) tablet 1,000 mcg  1,000 mcg Oral Daily Mansy, Jan A, MD   1,000 mcg at 11/25/20 9767   Facility-Administered Medications Ordered in Other  Encounters  Medication Dose Route Frequency Provider Last Rate Last Admin   sodium chloride flush (NS) 0.9 % injection 10 mL  10 mL Intravenous PRN Sindy Guadeloupe, MD   10 mL at 03/26/17 0948   sodium chloride flush (NS) 0.9 % injection 10 mL  10 mL Intravenous PRN Sindy Guadeloupe, MD   10 mL at 04/16/17 1006   sodium chloride flush (NS) 0.9 % injection 10 mL  10 mL Intravenous PRN Sindy Guadeloupe, MD   10 mL  at 01/10/18 1410   sodium chloride flush (NS) 0.9 % injection 10 mL  10 mL Intravenous PRN Lequita Asal, MD   10 mL at 12/11/18 1033     Discharge Medications: Please see discharge summary for a list of discharge medications.  Relevant Imaging Results:  Relevant Lab Results:   Additional Information [id number]  Pete Pelt, RN

## 2020-11-25 NOTE — Progress Notes (Signed)
Physical Therapy Treatment Patient Details Name: Roberta [name] MRN: [id number] DOB: February 18, 1946 Today's Date: 11/25/2020    History of Present Illness Patient is a 75 y.o. Caucasian female with medical history significant for combined systolic diastolic CHF, COPD, coronary artery disease, who recently had a left hip fracture for which she was admitted here on 5/29 and underwent left hemiarthroplasty then was discharged to rehab on 6/5.  She has been there till few days ago when she was discharged home from rehab.  She has been experiencing foul-smelling urine and urinary and stool incontinence with diarrhea.    PT Comments    Patient having orthostatic vitals taken by nursing staff on arrival to room. Limited standing tolerance this session with activity tolerance limited by fatigue. Patient declined ambulation. Two bouts of standing performed with maximal encouragement and cues for technique. Patient is deconditioned and needs continued PT to maximize independence and to decrease caregiver burden. Recommend SNF placement at discharge.    Follow Up Recommendations  SNF     Equipment Recommendations  None recommended by PT    Recommendations for Other Services       Precautions / Restrictions Precautions Precautions: Posterior Hip;Fall Restrictions Weight Bearing Restrictions: Yes LLE Weight Bearing: Weight bearing as tolerated    Mobility  Bed Mobility Overal bed mobility: Needs Assistance Bed Mobility: Sit to Supine       Sit to supine: Min assist   General bed mobility comments: assistance for LE support to return to bed. tactile cues for technique    Transfers Overall transfer level: Needs assistance Equipment used: Rolling walker (2 wheeled) Transfers: Sit to/from Stand   Stand pivot transfers: Min assist       General transfer comment: lifting assistance reuqired for standing. decreased eccentric control with transfers  Ambulation/Gait              General Gait Details: patient declined walking this session. poor participation with standing, reqeusting to sit after standing briefly due to fatigue. 2 bouts of standing performed   Stairs             Wheelchair Mobility    Modified Rankin (Stroke Patients Only)       Balance                                            Cognition Arousal/Alertness: Awake/alert Behavior During Therapy: WFL for tasks assessed/performed Overall Cognitive Status: Within Functional Limits for tasks assessed                                 General Comments: patient able to follow single step commands with repetition and extra time      Exercises Total Joint Exercises Ankle Circles/Pumps: AAROM;Strengthening;Both;10 reps;Supine Heel Slides: AAROM;Strengthening;Both;10 reps;Supine Hip ABduction/ADduction: AAROM;Strengthening;Both;10 reps;Supine Other Exercises Other Exercises: verbal cues for exercise technique    General Comments        Pertinent Vitals/Pain Pain Assessment: No/denies pain    Home Living                      Prior Function            PT Goals (current goals can now be found in the care plan section) Acute Rehab PT Goals Patient Stated Goal: to return home PT Goal  Formulation: With patient Time For Goal Achievement: 12/05/20 Potential to Achieve Goals: Good Progress towards PT goals: Progressing toward goals    Frequency    Min 2X/week      PT Plan Current plan remains appropriate    Co-evaluation              AM-PAC PT "6 Clicks" Mobility   Outcome Measure  Help needed turning from your back to your side while in a flat bed without using bedrails?: None Help needed moving from lying on your back to sitting on the side of a flat bed without using bedrails?: A Little Help needed moving to and from a bed to a chair (including a wheelchair)?: A Little Help needed standing up from a chair using your  arms (e.g., wheelchair or bedside chair)?: A Little Help needed to walk in hospital room?: A Little Help needed climbing 3-5 steps with a railing? : A Little 6 Click Score: 19    End of Session   Activity Tolerance: Patient tolerated treatment well Patient left: in bed;with call bell/phone within reach;with bed alarm set Nurse Communication: Mobility status PT Visit Diagnosis: Unsteadiness on feet (R26.81);Muscle weakness (generalized) (M62.81)     Time: 3646-8032 PT Time Calculation (min) (ACUTE ONLY): 28 min  Charges:  $Therapeutic Activity: 23-37 mins                     Minna Merritts, PT, MPT    Percell Locus 11/25/2020, 12:30 PM

## 2020-11-26 LAB — CULTURE, BLOOD (ROUTINE X 2)
Culture: NO GROWTH
Culture: NO GROWTH
Special Requests: ADEQUATE
Special Requests: ADEQUATE

## 2020-12-07 ENCOUNTER — Inpatient Hospital Stay: Payer: Medicare HMO

## 2020-12-07 ENCOUNTER — Other Ambulatory Visit: Payer: Self-pay

## 2020-12-08 ENCOUNTER — Ambulatory Visit: Payer: Medicare HMO | Admitting: Oncology

## 2020-12-16 ENCOUNTER — Inpatient Hospital Stay: Payer: Medicare HMO | Attending: Oncology

## 2020-12-16 ENCOUNTER — Ambulatory Visit
Admission: RE | Admit: 2020-12-16 | Discharge: 2020-12-16 | Disposition: A | Discharge status: Home / Self Care | Payer: Medicare HMO | Source: Ambulatory Visit | Attending: Hematology and Oncology | Admitting: Hematology and Oncology

## 2020-12-16 ENCOUNTER — Other Ambulatory Visit: Payer: Self-pay

## 2020-12-16 DIAGNOSIS — Z85118 Personal history of other malignant neoplasm of bronchus and lung: Secondary | ICD-10-CM | POA: Diagnosis not present

## 2020-12-16 DIAGNOSIS — E538 Deficiency of other specified B group vitamins: Secondary | ICD-10-CM | POA: Diagnosis not present

## 2020-12-16 DIAGNOSIS — C3411 Malignant neoplasm of upper lobe, right bronchus or lung: Secondary | ICD-10-CM

## 2020-12-16 DIAGNOSIS — Z95828 Presence of other vascular implants and grafts: Secondary | ICD-10-CM

## 2020-12-16 DIAGNOSIS — D509 Iron deficiency anemia, unspecified: Secondary | ICD-10-CM | POA: Insufficient documentation

## 2020-12-16 LAB — CBC WITH DIFFERENTIAL/PLATELET
Abs Immature Granulocytes: 0.08 10*3/uL — ABNORMAL HIGH (ref 0.00–0.07)
Basophils Absolute: 0 10*3/uL (ref 0.0–0.1)
Basophils Relative: 0 %
Eosinophils Absolute: 0.1 10*3/uL (ref 0.0–0.5)
Eosinophils Relative: 1 %
HCT: 29.2 % — ABNORMAL LOW (ref 36.0–46.0)
Hemoglobin: 9 g/dL — ABNORMAL LOW (ref 12.0–15.0)
Immature Granulocytes: 1 %
Lymphocytes Relative: 16 %
Lymphs Abs: 1.3 10*3/uL (ref 0.7–4.0)
MCH: 27.9 pg (ref 26.0–34.0)
MCHC: 30.8 g/dL (ref 30.0–36.0)
MCV: 90.4 fL (ref 80.0–100.0)
Monocytes Absolute: 0.5 10*3/uL (ref 0.1–1.0)
Monocytes Relative: 6 %
Neutro Abs: 6.2 10*3/uL (ref 1.7–7.7)
Neutrophils Relative %: 76 %
Platelets: 307 10*3/uL (ref 150–400)
RBC: 3.23 MIL/uL — ABNORMAL LOW (ref 3.87–5.11)
RDW: 16.2 % — ABNORMAL HIGH (ref 11.5–15.5)
WBC: 8.2 10*3/uL (ref 4.0–10.5)
nRBC: 0 % (ref 0.0–0.2)

## 2020-12-16 LAB — COMPREHENSIVE METABOLIC PANEL
ALT: 11 U/L (ref 0–44)
AST: 18 U/L (ref 15–41)
Albumin: 3 g/dL — ABNORMAL LOW (ref 3.5–5.0)
Alkaline Phosphatase: 99 U/L (ref 38–126)
Anion gap: 7 (ref 5–15)
BUN: 18 mg/dL (ref 8–23)
CO2: 28 mmol/L (ref 22–32)
Calcium: 8.5 mg/dL — ABNORMAL LOW (ref 8.9–10.3)
Chloride: 101 mmol/L (ref 98–111)
Creatinine, Ser: 0.67 mg/dL (ref 0.44–1.00)
GFR, Estimated: >60 mL/min (ref >60)
Glucose, Bld: 108 mg/dL — ABNORMAL HIGH (ref 70–99)
Potassium: 3.5 mmol/L (ref 3.5–5.1)
Sodium: 136 mmol/L (ref 135–145)
Total Bilirubin: 0.6 mg/dL (ref 0.3–1.2)
Total Protein: 6 g/dL — ABNORMAL LOW (ref 6.5–8.1)

## 2020-12-16 MED ORDER — HEPARIN SOD (PORK) LOCK FLUSH 100 UNIT/ML IV SOLN
500.0000 [IU] | Freq: Once | INTRAVENOUS | Status: AC
  Administered 2020-12-16: 500 [IU] via INTRAVENOUS
  Filled 2020-12-16: qty 5

## 2020-12-16 MED ORDER — SODIUM CHLORIDE 0.9% FLUSH
10.0000 mL | INTRAVENOUS | Status: DC | PRN
  Filled 2020-12-16: qty 10

## 2020-12-16 MED ORDER — IOHEXOL 300 MG/ML  SOLN
75.0000 mL | Freq: Once | INTRAMUSCULAR | Status: AC | PRN
  Administered 2020-12-16: 75 mL via INTRAVENOUS

## 2020-12-16 MED ORDER — HEPARIN SOD (PORK) LOCK FLUSH 100 UNIT/ML IV SOLN
INTRAVENOUS | Status: AC
  Filled 2020-12-16: qty 5

## 2020-12-17 LAB — CEA: CEA: 2.2 ng/mL (ref 0.0–4.7)

## 2020-12-22 ENCOUNTER — Ambulatory Visit: Payer: Medicare HMO | Admitting: Oncology

## 2020-12-27 NOTE — Progress Notes (Signed)
Cardiology Office Note  Date:  12/28/2020   ID:  Roberta [name], Roberta [name] 1945-06-09, MRN [id number]  PCP:  Gayland Curry, MD   Chief Complaint  Patient presents with   Other    Follow up post [facility] -- patient c.o SOB with exertion. Meds reviewed verbally with patient.     HPI:  Roberta [name] is a 75 y.o. female with a hx of  cardiomyopathy of uncertain etiology,  lung cancer,  breast cancer,  moderate aortic stenosis and mitral regurgitation,  AAA s/p endovascular repair,  COPD,  tobacco use quitting in 09/2020,   HLD  who is being seen today for the evaluation of cardiomyopathy, systolic CHF  Recent procedures reviewed  endovascular repair of her AAA along with aortic, iliac, and right renal artery stenting.    admitted to [facility] in 09/2020 with progressive dyspnea and orthopnea with imaging showing pulmonary edema and a right pleural effusion.    Echo showed an EF of 25 to 30%.   GDMT was limited in the setting of relative hypotension.    admitted again in late 09/2020 with a mechanical fall and found to have a left subcapital hip fracture.  underwent surgical repair of her femoral neck fracture.  She was discharged to rehab   readmitted on 11/21/2020 with mild sepsis in the setting of UTI and diarrhea.   CT of the abdomen pelvis showed no acute pathology.  She has been treated with IV Rocephin    6/26 she was noted to be hypoxic with O2 saturation of 89% on room air leading to the discontinuation of IV fluids.   dose of IV Lasix with documented urine output  2.4 L for the past 24 hours and 2.5 L negative for the admission.   Chest x-ray showed no acute cardiopulmonary disease with stable posttreatment scarring on the right with superior right hilar retraction as well as hyperinflation consistent with known COPD.    In follow-up today she is recovering at [facility], doing well Family presents with her, denies significant chest pain, shortness of breath, no significant  leg edema On Lasix 20 mg 3 days a week Denies\orthostasis  EKG personally reviewed by myself on todays visit Normal sinus rhythm rate 86 bpm LVH  PMH:   has a past medical history of AAA (abdominal aortic aneurysm) ([place]), Anemia, Breast cancer ([place]) (2002), Cardiomyopathy ([place]), Chronic combined systolic and diastolic CHF (congestive heart failure) ([place]), COPD (chronic obstructive pulmonary disease) ([place]), Coronary artery calcification seen on CT scan, Dyspnea, Headache, High cholesterol, History of stress test, Mitral regurgitation, Moderate aortic stenosis, Personal history of chemotherapy, Small cell lung cancer ([place]), and Tobacco abuse.  PSH:    Past Surgical History:  Procedure Laterality Date   ABDOMINAL HYSTERECTOMY     ARTERY BIOPSY Right 12/05/2017   Procedure: BIOPSY TEMPORAL ARTERY;  Surgeon: Margaretha Sheffield, MD;  Location: [facility] ORS;  Service: ENT;  Laterality: Right;   ENDOBRONCHIAL ULTRASOUND N/A 02/19/2017   Procedure: ENDOBRONCHIAL ULTRASOUND;  Surgeon: Flora Lipps, MD;  Location: [facility] ORS;  Service: Cardiopulmonary;  Laterality: N/A;   ENDOVASCULAR REPAIR/STENT GRAFT N/A 12/06/2016   Procedure: Endovascular Repair/Stent Graft;  Surgeon: Katha Cabal, MD;  Location: [place] [facility];  Service: Cardiovascular;  Laterality: N/A;   HIP ARTHROPLASTY Left 10/26/2020   Procedure: ARTHROPLASTY BIPOLAR HIP (HEMIARTHROPLASTY);  Surgeon: Renee Harder, MD;  Location: [facility] ORS;  Service: Orthopedics;  Laterality: Left;   IR FLUORO GUIDE PORT INSERTION RIGHT  03/02/2017   KYPHOPLASTY N/A  06/11/2018   Procedure: KYPHOPLASTY L4 BIOPSY WITH RFA;  Surgeon: Hessie Knows, MD;  Location: [facility] ORS;  Service: Orthopedics;  Laterality: N/A;   KYPHOPLASTY N/A 12/16/2019   Procedure: T6 KYPHOPLASTY;  Surgeon: Hessie Knows, MD;  Location: [facility] ORS;  Service: Orthopedics;  Laterality: N/A;   MASTECTOMY Left 2002   with sentinel node     Current Outpatient Medications  Medication Sig  Dispense Refill   acetaminophen (TYLENOL) 500 MG tablet Take 1,000 mg by mouth every 8 (eight) hours as needed for mild pain.     albuterol (PROVENTIL HFA;VENTOLIN HFA) 108 (90 Base) MCG/ACT inhaler Inhale 2 puffs into the lungs every 6 (six) hours as needed for wheezing or shortness of breath. 1 Inhaler 1   aspirin EC 81 MG EC tablet Take 1 tablet (81 mg total) by mouth daily. Swallow whole. 30 tablet 11   atorvastatin (LIPITOR) 10 MG tablet Take 10 mg by mouth at bedtime.      fluticasone-salmeterol (ADVAIR HFA) 115-21 MCG/ACT inhaler Inhale 2 puffs into the lungs 2 (two) times daily.     furosemide (LASIX) 20 MG tablet Take 1 tablet (20 mg total) by mouth every Monday, Wednesday, and Friday. 30 tablet 0   ipratropium (ATROVENT) 0.02 % nebulizer solution Take 3 mLs by nebulization in the morning, at noon, in the evening, and at bedtime.     loperamide (IMODIUM) 2 MG capsule Take 1 capsule (2 mg total) by mouth as needed for diarrhea or loose stools. 30 capsule 0   magnesium hydroxide (MILK OF MAGNESIA) 400 MG/5ML suspension Take 30 mLs by mouth daily as needed for mild constipation. 355 mL 0   melatonin 5 MG TABS Take 0.5 tablets (2.5 mg total) by mouth at bedtime.  0   nicotine (NICODERM CQ - DOSED IN MG/24 HOURS) 14 mg/24hr patch Place 1 patch (14 mg total) onto the skin daily. 28 patch 0   omeprazole (PRILOSEC) 20 MG capsule Take 1 capsule (20 mg total) by mouth daily. 30 capsule 1   traZODone (DESYREL) 50 MG tablet Take 1 tablet (50 mg total) by mouth at bedtime.     vitamin B-12 (CYANOCOBALAMIN) 1000 MCG tablet Take 1 tablet (1,000 mcg total) by mouth daily. 30 tablet 3   No current facility-administered medications for this visit.   Facility-Administered Medications Ordered in Other Visits  Medication Dose Route Frequency Provider Last Rate Last Admin   sodium chloride flush (NS) 0.9 % injection 10 mL  10 mL Intravenous PRN Sindy Guadeloupe, MD   10 mL at 03/26/17 0948   sodium chloride  flush (NS) 0.9 % injection 10 mL  10 mL Intravenous PRN Sindy Guadeloupe, MD   10 mL at 04/16/17 1006   sodium chloride flush (NS) 0.9 % injection 10 mL  10 mL Intravenous PRN Sindy Guadeloupe, MD   10 mL at 01/10/18 1410   sodium chloride flush (NS) 0.9 % injection 10 mL  10 mL Intravenous PRN Lequita Asal, MD   10 mL at 12/11/18 1033     Allergies:   Patient has no known allergies.   Social History:  The patient  reports that she has quit smoking. Her smoking use included cigarettes. She has a 75.00 pack-year smoking history. She has never used smokeless tobacco. She reports that she does not drink alcohol and does not use drugs.   Family History:   family history includes CAD in her mother; Colon cancer in her brother.  Review of Systems: Review of Systems  Constitutional: Negative.   HENT: Negative.    Respiratory: Negative.    Cardiovascular: Negative.   Gastrointestinal: Negative.   Musculoskeletal: Negative.   Neurological: Negative.   Psychiatric/Behavioral: Negative.    All other systems reviewed and are negative.   PHYSICAL EXAM: VS:  BP 138/76 (BP Location: Right Arm, Patient Position: Sitting, Cuff Size: Normal)   Pulse 86   Ht 5\' 7"  (1.702 m)   Wt 132 lb (59.9 kg)   SpO2 97%   BMI 20.67 kg/m  , BMI Body mass index is 20.67 kg/m. GEN: Well nourished, well developed, in no acute distress HEENT: normal Neck: no JVD, carotid bruits, or masses Cardiac: RRR; no murmurs, rubs, or gallops,no edema  Respiratory:  clear to auscultation bilaterally, normal work of breathing GI: soft, nontender, nondistended, + BS MS: no deformity or atrophy Skin: warm and dry, no rash Neuro:  Strength and sensation are intact Psych: euthymic mood, full affect   Recent Labs: 09/08/2020: TSH 1.301 10/12/2020: B Natriuretic Peptide 1,331.8 11/20/2020: Magnesium 1.9 12/16/2020: ALT 11; BUN 18; Creatinine, Ser 0.67; Hemoglobin 9.0; Platelets 307; Potassium 3.5; Sodium 136    Lipid  Panel Lab Results  Component Value Date   CHOL 162 09/08/2020   HDL 49 09/08/2020   LDLCALC 93 09/08/2020   TRIG 98 09/08/2020     Wt Readings from Last 3 Encounters:  12/28/20 132 lb (59.9 kg)  11/20/20 147 lb (66.7 kg)  10/24/20 142 lb 10.2 oz (64.7 kg)     ASSESSMENT AND PLAN:  Problem List Items Addressed This Visit       Cardiology Problems   Chronic HFrEF (heart failure with reduced ejection fraction) (HCC) - Primary   PAD (peripheral artery disease) (HCC)   Hyperlipidemia   AAA (abdominal aortic aneurysm) without rupture (HCC)   Chronic systolic CHF/cardiomyopathy Continue Lasix 3 days a week Blood pressure stable we will add metoprolol succinate 12.5 mg daily, losartan 25 mg daily Follow-up in 3 months  Peripheral arterial disease AAA, endovascular repair  History of falls, hip fracture Recent surgical repair 1 month ago, doing well in recovery  Aortic valve stenosis Medical management at this time, Periodic echocardiography for further evaluation on annual basis    Total encounter time more than 35 minutes  Greater than 50% was spent in counseling and coordination of care with the patient    Signed, Esmond Plants, M.D., Ph.D. [place], [place]

## 2020-12-28 ENCOUNTER — Ambulatory Visit (INDEPENDENT_AMBULATORY_CARE_PROVIDER_SITE_OTHER): Payer: Medicare HMO | Admitting: Cardiovascular Disease

## 2020-12-28 ENCOUNTER — Telehealth: Payer: Self-pay | Admitting: Cardiovascular Disease

## 2020-12-28 ENCOUNTER — Encounter: Payer: Self-pay | Admitting: Cardiovascular Disease

## 2020-12-28 ENCOUNTER — Other Ambulatory Visit: Payer: Self-pay

## 2020-12-28 VITALS — BP 138/76 | HR 86 | Ht 67.0 in | Wt 132.0 lb

## 2020-12-28 DIAGNOSIS — I739 Peripheral vascular disease, unspecified: Secondary | ICD-10-CM

## 2020-12-28 DIAGNOSIS — I714 Abdominal aortic aneurysm, without rupture, unspecified: Secondary | ICD-10-CM

## 2020-12-28 DIAGNOSIS — I5022 Chronic systolic (congestive) heart failure: Secondary | ICD-10-CM | POA: Diagnosis not present

## 2020-12-28 DIAGNOSIS — E782 Mixed hyperlipidemia: Secondary | ICD-10-CM | POA: Diagnosis not present

## 2020-12-28 MED ORDER — LOSARTAN POTASSIUM 25 MG PO TABS: 25.0000 mg | ORAL_TABLET | Freq: Every day | ORAL | 3 refills | Status: AC

## 2020-12-28 MED ORDER — LOSARTAN POTASSIUM 25 MG PO TABS: 25.0000 mg | ORAL_TABLET | Freq: Every day | ORAL | 3 refills | Status: DC

## 2020-12-28 MED ORDER — METOPROLOL SUCCINATE ER 25 MG PO TB24: 12.5000 mg | ORAL_TABLET | Freq: Every day | ORAL | 3 refills | Status: AC

## 2020-12-28 MED ORDER — METOPROLOL SUCCINATE ER 25 MG PO TB24: 12.5000 mg | ORAL_TABLET | Freq: Every day | ORAL | 3 refills | Status: DC

## 2020-12-28 NOTE — Patient Instructions (Addendum)
Medication Instructions:  START metoprolol succinate 12.5 mg daily losartan 25 mg daily  If you need a refill on your cardiac medications before your next appointment, please call your pharmacy.    Lab work: No new labs needed  Testing/Procedures: No new testing needed  Follow-Up: At [facility], you and your health needs are our priority.  As part of our continuing mission to provide you with exceptional heart care, we have created designated Provider Care Teams.  These Care Teams include your primary Cardiologist (physician) and Advanced Practice Providers (APPs -  Physician Assistants and Nurse Practitioners) who all work together to provide you with the care you need, when you need it.  You will need a follow up appointment in 3 months  Providers on your designated Care Team:   Murray Hodgkins, NP Christell Faith, PA-C Marrianne Mood, PA-C Cadence Stiles, [place]  COVID-19 Vaccine Information can be found at: [email or web address] For questions related to vaccine distribution or appointments, please email [email or web address] or call [phone].

## 2020-12-28 NOTE — Telephone Encounter (Signed)
STAT if patient feels like he/she is going to faint   Are you dizzy now? yes  Do you feel faint or have you passed out? no  Do you have any other symptoms? No other symptoms  Have you checked your HR and BP (record if available)? BP is 107/67

## 2020-12-28 NOTE — Telephone Encounter (Signed)
Attempted to call pt at number provided that had called in, this number is not associated with Roberta [name], caller reports she does not know mrs. Mciver. Caller asked "are you a nurse trying to contact that person?", advised that I was, caller stated, Im sorry, but I am not her, hope you get in touch with her". Thank the caller for being kind and sorry for the miscommunication.  Mrs. Chilton was just seen in clinic today by Dr. Rockey Situ, was d/c 15 mins ago, no concerns at that time, considering the number was a miss call.

## 2020-12-29 ENCOUNTER — Inpatient Hospital Stay: Payer: Medicare HMO | Attending: Oncology | Admitting: Oncology

## 2020-12-29 ENCOUNTER — Other Ambulatory Visit: Payer: Self-pay | Admitting: Oncology

## 2020-12-29 ENCOUNTER — Encounter: Payer: Self-pay | Admitting: Oncology

## 2020-12-29 VITALS — BP 134/78 | HR 90 | Temp 97.9°F | Resp 14 | Wt 133.5 lb

## 2020-12-29 DIAGNOSIS — E538 Deficiency of other specified B group vitamins: Secondary | ICD-10-CM | POA: Insufficient documentation

## 2020-12-29 DIAGNOSIS — D649 Anemia, unspecified: Secondary | ICD-10-CM | POA: Diagnosis not present

## 2020-12-29 DIAGNOSIS — Z85118 Personal history of other malignant neoplasm of bronchus and lung: Secondary | ICD-10-CM | POA: Diagnosis not present

## 2020-12-29 DIAGNOSIS — Z08 Encounter for follow-up examination after completed treatment for malignant neoplasm: Secondary | ICD-10-CM | POA: Diagnosis not present

## 2020-12-29 DIAGNOSIS — Z9221 Personal history of antineoplastic chemotherapy: Secondary | ICD-10-CM | POA: Insufficient documentation

## 2020-12-29 NOTE — Progress Notes (Signed)
Hematology/Oncology Consult note [facility]  [phone] [phone]  Patient Care Team: Gayland Curry, MD as PCP - General (Family Medicine) Rockey Situ Kathlene November, MD as PCP - Cardiology (Cardiology) Gayland Curry, MD (Family Medicine) Telford Nab, RN as Registered Nurse Noreene Filbert, MD as Referring Physician (Radiation Oncology) Lequita Asal, MD as Consulting Physician (Hematology and Oncology) Hessie Knows, MD as Consulting Physician (Orthopedic Surgery)   Name of the patient: Roberta [name]  [id number]  [birth year]   Date of visit: 12/29/20  Diagnosis-history of small cell lung cancer in 2018  Chief complaint/ Reason for visit-routine follow-up of lung cancer    Heme/Onc history: patient is a 75 year old female with history of 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  She received concurrent chemoradiation with cisplatin and etoposide between October 2018 to December 2018.  She also received prophylactic cranial irradiation thereafter.  She has been in remission since then.  She does have ongoing problems with L4 and T5 compression fractures s/p kyphoplasty and most recently hip fracture as well as cardiomyopathy.  She is currently a resident of nursing home.  Interval history-patient is here with her daughter today.  She is doing well at her nursing home.  Reports baseline fatigue  ECOG PS- 3 Pain scale- 3   Review of systems- Review of Systems  Constitutional:  Positive for malaise/fatigue. Negative for chills, fever and weight loss.  HENT:  Negative for congestion, ear discharge and nosebleeds.   Eyes:  Negative for blurred vision.  Respiratory:  Negative for cough, hemoptysis, sputum production, shortness of breath and wheezing.   Cardiovascular:  Negative for chest pain, palpitations, orthopnea and claudication.  Gastrointestinal:  Negative for abdominal pain, blood in  stool, constipation, diarrhea, heartburn, melena, nausea and vomiting.  Genitourinary:  Negative for dysuria, flank pain, frequency, hematuria and urgency.  Musculoskeletal:  Positive for back pain and joint pain. Negative for myalgias.  Skin:  Negative for rash.  Neurological:  Negative for dizziness, tingling, focal weakness, seizures, weakness and headaches.  Endo/Heme/Allergies:  Does not bruise/bleed easily.  Psychiatric/Behavioral:  Negative for depression and suicidal ideas. The patient does not have insomnia.       No Known Allergies   Past Medical History:  Diagnosis Date   AAA (abdominal aortic aneurysm) ([place])    Anemia    Breast cancer ([place]) 2002   left   Cardiomyopathy ([place])    a. 09/2020 Echo: EF 25-30%, glob HK, Gr2 DD, mod reduced RV fxn. Mod dil LA. Mild to mod MR. Mod AS.   Chronic combined systolic and diastolic CHF (congestive heart failure) ([place])    a. 09/2020 Echo: EF 25-30%, glob HK, gr2 DD.   COPD (chronic obstructive pulmonary disease) (HCC)    Coronary artery calcification seen on CT scan    Dyspnea    Headache    High cholesterol    History of stress test    a. 10/2016 Low risk MV. EF 60%.   Mitral regurgitation    Moderate aortic stenosis    Personal history of chemotherapy    Small cell lung cancer ([place])    Tobacco abuse      Past Surgical History:  Procedure Laterality Date   ABDOMINAL HYSTERECTOMY     ARTERY BIOPSY Right 12/05/2017   Procedure: BIOPSY TEMPORAL ARTERY;  Surgeon: Margaretha Sheffield, MD;  Location: [facility] ORS;  Service: ENT;  Laterality: Right;   ENDOBRONCHIAL ULTRASOUND N/A 02/19/2017  Procedure: ENDOBRONCHIAL ULTRASOUND;  Surgeon: Flora Lipps, MD;  Location: [facility] ORS;  Service: Cardiopulmonary;  Laterality: N/A;   ENDOVASCULAR REPAIR/STENT GRAFT N/A 12/06/2016   Procedure: Endovascular Repair/Stent Graft;  Surgeon: Katha Cabal, MD;  Location: [place] [facility];  Service: Cardiovascular;  Laterality: N/A;   HIP ARTHROPLASTY  Left 10/26/2020   Procedure: ARTHROPLASTY BIPOLAR HIP (HEMIARTHROPLASTY);  Surgeon: Renee Harder, MD;  Location: [facility] ORS;  Service: Orthopedics;  Laterality: Left;   IR FLUORO GUIDE PORT INSERTION RIGHT  03/02/2017   KYPHOPLASTY N/A 06/11/2018   Procedure: KYPHOPLASTY L4 BIOPSY WITH RFA;  Surgeon: Hessie Knows, MD;  Location: [facility] ORS;  Service: Orthopedics;  Laterality: N/A;   KYPHOPLASTY N/A 12/16/2019   Procedure: T6 KYPHOPLASTY;  Surgeon: Hessie Knows, MD;  Location: [facility] ORS;  Service: Orthopedics;  Laterality: N/A;   MASTECTOMY Left 2002   with sentinel node     Social History   Socioeconomic History   Marital status: Widowed    Spouse name: Not on file   Number of children: Not on file   Years of education: Not on file   Highest education level: Not on file  Occupational History   Not on file  Tobacco Use   Smoking status: Former    Packs/day: 1.50    Years: 50.00    Pack years: 75.00    Types: Cigarettes   Smokeless tobacco: Never  Vaping Use   Vaping Use: Never used  Substance and Sexual Activity   Alcohol use: No   Drug use: No   Sexual activity: Never  Other Topics Concern   Not on file  Social History Narrative   Not on file   Social Determinants of Health   Financial Resource Strain: Not on file  Food Insecurity: Not on file  Transportation Needs: Not on file  Physical Activity: Not on file  Stress: Not on file  Social Connections: Not on file  Intimate Partner Violence: Not on file    Family History  Problem Relation Age of Onset   CAD Mother    Colon cancer Brother    Breast cancer Neg Hx      Current Outpatient Medications:    albuterol (PROVENTIL HFA;VENTOLIN HFA) 108 (90 Base) MCG/ACT inhaler, Inhale 2 puffs into the lungs every 6 (six) hours as needed for wheezing or shortness of breath., Disp: 1 Inhaler, Rfl: 1   aspirin EC 81 MG EC tablet, Take 1 tablet (81 mg total) by mouth daily. Swallow whole., Disp: 30 tablet, Rfl: 11    atorvastatin (LIPITOR) 10 MG tablet, Take 10 mg by mouth at bedtime. , Disp: , Rfl:    fluticasone-salmeterol (ADVAIR HFA) 115-21 MCG/ACT inhaler, Inhale 2 puffs into the lungs 2 (two) times daily., Disp: , Rfl:    furosemide (LASIX) 20 MG tablet, Take 1 tablet (20 mg total) by mouth every Monday, Wednesday, and Friday., Disp: 30 tablet, Rfl: 0   ipratropium (ATROVENT) 0.02 % nebulizer solution, Take 3 mLs by nebulization in the morning, at noon, in the evening, and at bedtime., Disp: , Rfl:    loperamide (IMODIUM) 2 MG capsule, Take 1 capsule (2 mg total) by mouth as needed for diarrhea or loose stools., Disp: 30 capsule, Rfl: 0   losartan (COZAAR) 25 MG tablet, Take 1 tablet (25 mg total) by mouth daily., Disp: 90 tablet, Rfl: 3   magnesium hydroxide (MILK OF MAGNESIA) 400 MG/5ML suspension, Take 30 mLs by mouth daily as needed for mild constipation., Disp: 355 mL, Rfl:  0   melatonin 5 MG TABS, Take 0.5 tablets (2.5 mg total) by mouth at bedtime., Disp: , Rfl: 0   metoprolol succinate (TOPROL XL) 25 MG 24 hr tablet, Take 0.5 tablets (12.5 mg total) by mouth daily., Disp: 45 tablet, Rfl: 3   nicotine (NICODERM CQ - DOSED IN MG/24 HOURS) 14 mg/24hr patch, Place 1 patch (14 mg total) onto the skin daily., Disp: 28 patch, Rfl: 0   omeprazole (PRILOSEC) 20 MG capsule, Take 1 capsule (20 mg total) by mouth daily., Disp: 30 capsule, Rfl: 1   traZODone (DESYREL) 50 MG tablet, Take 1 tablet (50 mg total) by mouth at bedtime., Disp: , Rfl:    vitamin B-12 (CYANOCOBALAMIN) 1000 MCG tablet, Take 1 tablet (1,000 mcg total) by mouth daily., Disp: 30 tablet, Rfl: 3   acetaminophen (TYLENOL) 500 MG tablet, Take 1,000 mg by mouth every 8 (eight) hours as needed for mild pain., Disp: , Rfl:  No current facility-administered medications for this visit.  Facility-Administered Medications Ordered in Other Visits:    sodium chloride flush (NS) 0.9 % injection 10 mL, 10 mL, Intravenous, PRN, Sindy Guadeloupe, MD, 10 mL at  03/26/17 0948   sodium chloride flush (NS) 0.9 % injection 10 mL, 10 mL, Intravenous, PRN, Sindy Guadeloupe, MD, 10 mL at 04/16/17 1006   sodium chloride flush (NS) 0.9 % injection 10 mL, 10 mL, Intravenous, PRN, Sindy Guadeloupe, MD, 10 mL at 01/10/18 1410   sodium chloride flush (NS) 0.9 % injection 10 mL, 10 mL, Intravenous, PRN, Mike Gip, Melissa C, MD, 10 mL at 12/11/18 1033  Physical exam:  Vitals:   12/29/20 1032  BP: 134/78  Pulse: 90  Resp: 14  Temp: 97.9 F (36.6 C)  SpO2: 98%  Weight: 133 lb 7.8 oz (60.6 kg)   Physical Exam Constitutional:      Comments: Patient is frail and elderly.  Sitting in a wheelchair.  Appears in no acute distress  Cardiovascular:     Rate and Rhythm: Normal rate and regular rhythm.     Heart sounds: Murmur heard.  Pulmonary:     Effort: Pulmonary effort is normal.     Breath sounds: Normal breath sounds.  Abdominal:     General: Bowel sounds are normal.     Palpations: Abdomen is soft.  Skin:    General: Skin is warm and dry.  Neurological:     Mental Status: She is alert and oriented to person, place, and time.     CMP Latest Ref Rng & Units 12/16/2020  Glucose 70 - 99 mg/dL 108(H)  BUN 8 - 23 mg/dL 18  Creatinine 0.44 - 1.00 mg/dL 0.67  Sodium 135 - 145 mmol/L 136  Potassium 3.5 - 5.1 mmol/L 3.5  Chloride 98 - 111 mmol/L 101  CO2 22 - 32 mmol/L 28  Calcium 8.9 - 10.3 mg/dL 8.5(L)  Total Protein 6.5 - 8.1 g/dL 6.0(L)  Total Bilirubin 0.3 - 1.2 mg/dL 0.6  Alkaline Phos 38 - 126 U/L 99  AST 15 - 41 U/L 18  ALT 0 - 44 U/L 11   CBC Latest Ref Rng & Units 12/16/2020  WBC 4.0 - 10.5 K/uL 8.2  Hemoglobin 12.0 - 15.0 g/dL 9.0(L)  Hematocrit 36.0 - 46.0 % 29.2(L)  Platelets 150 - 400 K/uL 307    No images are attached to the encounter.  CT CHEST W CONTRAST  Result Date: 12/17/2020 CLINICAL DATA:  Small cell lung cancer, assess treatment response. EXAM: CT  CHEST WITH CONTRAST TECHNIQUE: Multidetector CT imaging of the chest was  performed during intravenous contrast administration. CONTRAST:  15mL OMNIPAQUE IOHEXOL 300 MG/ML  SOLN COMPARISON:  Chest CT 10/12/2020 and 09/07/2020. FINDINGS: Cardiovascular: Extensive atherosclerosis of the aorta, great vessels and coronary arteries. No acute vascular findings are evident. There are calcifications of the aortic valve. Left IJ Port-A-Cath extends to the superior cavoatrial junction. The heart size is normal. There is no pericardial effusion. Mediastinum/Nodes: There are no enlarged mediastinal, hilar or axillary lymph nodes. The thyroid gland, trachea and esophagus demonstrate no significant findings. Lungs/Pleura: Previously demonstrated right-greater-than-left pleural effusions have decreased in volume. Interval improved aeration of both lungs. There are chronic radiation changes in the right perihilar region with associated architectural distortion and traction bronchiectasis. Additional scarring is present at both lung apices. No evidence of local recurrence or metastatic disease. Underlying mild centrilobular and paraseptal emphysema. Upper abdomen: The visualized upper abdomen appears stable without significant findings. Aortic atherosclerosis noted. Musculoskeletal/Chest wall: There is no chest wall mass or suspicious osseous finding. Previous spinal augmentation at T6. Chronic superior endplate compression fracture at T5 and stable straightening of the usual thoracic kyphosis. IMPRESSION: 1. Interval improvement in previously demonstrated bilateral pleural effusions with significant interval improvement in the pulmonary aeration bilaterally. 2. No evidence of local recurrence or metastatic disease. 3. Stable radiation changes in the right perihilar region. 4. Coronary and Aortic Atherosclerosis (ICD10-I70.0). Emphysema (ICD10-J43.9). Electronically Signed   By: Richardean Sale M.D.   On: 12/17/2020 15:39     Assessment and plan- Patient is a 75 y.o. female with history of stage III  limited stage small cell lung cancer in 2018 s/p concurrent chemoradiation now in remission here for routine follow-up  Recent CT chest from 12/16/2020 did not show any evidence of recurrence.  Improvement in bilateral pleural effusions and improvement in pulmonary aeration.  Radiation changes in the right perihilar region.  We will continue surveillance imaging every 6 months.  We will get her port out at this time.    Normocytic anemia: Likely secondary to chronic disease.  I will see her back in 6 months with CBC ferritin iron studies B12 and folate and CT chest prior   Visit Diagnosis 1. Encounter for follow-up surveillance of lung cancer   2. B12 deficiency   3. Normocytic anemia      Dr. Randa Evens, MD, MPH [facility] [phone] 12/29/2020 4:06 PM

## 2020-12-30 ENCOUNTER — Other Ambulatory Visit: Payer: Self-pay | Admitting: *Deleted

## 2020-12-30 DIAGNOSIS — Z85118 Personal history of other malignant neoplasm of bronchus and lung: Secondary | ICD-10-CM

## 2020-12-30 DIAGNOSIS — Z08 Encounter for follow-up examination after completed treatment for malignant neoplasm: Secondary | ICD-10-CM

## 2021-03-15 ENCOUNTER — Encounter: Payer: Self-pay | Admitting: Hematology and Oncology

## 2021-04-03 NOTE — Progress Notes (Deleted)
Cardiology Office Note  Date:  04/03/2021   ID:  Roberta [name] Oct 05, 1945, MRN [id number]  PCP:  Gayland Curry, MD   No chief complaint on file.   HPI:  Roberta [name] is a 75 y.o. female with a hx of  cardiomyopathy of uncertain etiology,  lung cancer,  breast cancer,  moderate aortic stenosis and mitral regurgitation,  AAA s/p endovascular repair,  COPD,  tobacco use quitting in 09/2020,   HLD  who is being seen today for the evaluation of cardiomyopathy, systolic CHF  Recent procedures reviewed  endovascular repair of her AAA along with aortic, iliac, and right renal artery stenting.    admitted to [facility] in 09/2020 with progressive dyspnea and orthopnea with imaging showing pulmonary edema and a right pleural effusion.    Echo showed an EF of 25 to 30%.   GDMT was limited in the setting of relative hypotension.    admitted again in late 09/2020 with a mechanical fall and found to have a left subcapital hip fracture.  underwent surgical repair of her femoral neck fracture.  She was discharged to rehab   readmitted on 11/21/2020 with mild sepsis in the setting of UTI and diarrhea.   CT of the abdomen pelvis showed no acute pathology.  She has been treated with IV Rocephin    6/26 she was noted to be hypoxic with O2 saturation of 89% on room air leading to the discontinuation of IV fluids.   dose of IV Lasix with documented urine output  2.4 L for the past 24 hours and 2.5 L negative for the admission.   Chest x-ray showed no acute cardiopulmonary disease with stable posttreatment scarring on the right with superior right hilar retraction as well as hyperinflation consistent with known COPD.    In follow-up today she is recovering at [facility], doing well Family presents with her, denies significant chest pain, shortness of breath, no significant leg edema On Lasix 20 mg 3 days a week Denies\orthostasis  EKG personally reviewed by myself on todays  visit Normal sinus rhythm rate 86 bpm LVH  PMH:   has a past medical history of AAA (abdominal aortic aneurysm) ([place]), Anemia, Breast cancer ([place]) (2002), Cardiomyopathy ([place]), Chronic combined systolic and diastolic CHF (congestive heart failure) ([place]), COPD (chronic obstructive pulmonary disease) ([place]), Coronary artery calcification seen on CT scan, Dyspnea, Headache, High cholesterol, History of stress test, Mitral regurgitation, Moderate aortic stenosis, Personal history of chemotherapy, Small cell lung cancer ([place]), and Tobacco abuse.  PSH:    Past Surgical History:  Procedure Laterality Date   ABDOMINAL HYSTERECTOMY     ARTERY BIOPSY Right 12/05/2017   Procedure: BIOPSY TEMPORAL ARTERY;  Surgeon: Margaretha Sheffield, MD;  Location: [facility]S;  Service: ENT;  Laterality: Right;   ENDOBRONCHIAL ULTRASOUND N/A 02/19/2017   Procedure: ENDOBRONCHIAL ULTRASOUND;  Surgeon: Flora Lipps, MD;  Location: [facility];  Service: Cardiopulmonary;  Laterality: N/A;   ENDOVASCULAR REPAIR/STENT GRAFT N/A 12/06/2016   Procedure: Endovascular Repair/Stent Graft;  Surgeon: Katha Cabal, MD;  Location: [place] [facility];  Service: Cardiovascular;  Laterality: N/A;   HIP ARTHROPLASTY Left 10/26/2020   Procedure: ARTHROPLASTY BIPOLAR HIP (HEMIARTHROPLASTY);  Surgeon: Renee Harder, MD;  Location: [facility] OR[facility];  Service: Orthopedics;  Laterality: Left;   IR FLUORO GUIDE PORT INSERTION RIGHT  03/02/2017   KYPHOPLASTY N/A 06/11/2018   Procedure: KYPHOPLASTY L4 BIOPSY WITH RFA;  Surgeon: Hessie Knows, MD;  Location: [facility] ORS;  Service: Orthopedics;  Laterality: N/A;  KYPHOPLASTY N/A 12/16/2019   Procedure: T6 KYPHOPLASTY;  Surgeon: Hessie Knows, MD;  Location: [facility];  Service: Orthopedics;  Laterality: N/A;   MASTECTOMY Left 2002   with sentinel node     Current Outpatient Medications  Medication Sig Dispense Refill   acetaminophen (TYLENOL) 500 MG tablet Take 1,000 mg by mouth every 8 (eight) hours as needed  for mild pain.     albuterol (PROVENTIL HFA;VENTOLIN HFA) 108 (90 Base) MCG/ACT inhaler Inhale 2 puffs into the lungs every 6 (six) hours as needed for wheezing or shortness of breath. 1 Inhaler 1   aspirin EC 81 MG EC tablet Take 1 tablet (81 mg total) by mouth daily. Swallow whole. 30 tablet 11   atorvastatin (LIPITOR) 10 MG tablet Take 10 mg by mouth at bedtime.      fluticasone-salmeterol (ADVAIR HFA) 115-21 MCG/ACT inhaler Inhale 2 puffs into the lungs 2 (two) times daily.     furosemide (LASIX) 20 MG tablet Take 1 tablet (20 mg total) by mouth every Monday, Wednesday, and Friday. 30 tablet 0   ipratropium (ATROVENT) 0.02 % nebulizer solution Take 3 mLs by nebulization in the morning, at noon, in the evening, and at bedtime.     loperamide (IMODIUM) 2 MG capsule Take 1 capsule (2 mg total) by mouth as needed for diarrhea or loose stools. 30 capsule 0   losartan (COZAAR) 25 MG tablet Take 1 tablet (25 mg total) by mouth daily. 90 tablet 3   magnesium hydroxide (MILK OF MAGNESIA) 400 MG/5ML suspension Take 30 mLs by mouth daily as needed for mild constipation. 355 mL 0   melatonin 5 MG TABS Take 0.5 tablets (2.5 mg total) by mouth at bedtime.  0   metoprolol succinate (TOPROL XL) 25 MG 24 hr tablet Take 0.5 tablets (12.5 mg total) by mouth daily. 45 tablet 3   nicotine (NICODERM CQ - DOSED IN MG/24 HOURS) 14 mg/24hr patch Place 1 patch (14 mg total) onto the skin daily. 28 patch 0   omeprazole (PRILOSEC) 20 MG capsule Take 1 capsule (20 mg total) by mouth daily. 30 capsule 1   traZODone (DESYREL) 50 MG tablet Take 1 tablet (50 mg total) by mouth at bedtime.     vitamin B-12 (CYANOCOBALAMIN) 1000 MCG tablet Take 1 tablet (1,000 mcg total) by mouth daily. 30 tablet 3   No current facility-administered medications for this visit.   Facility-Administered Medications Ordered in Other Visits  Medication Dose Route Frequency Provider Last Rate Last Admin   sodium chloride flush (NS) 0.9 % injection  10 mL  10 mL Intravenous PRN Sindy Guadeloupe, MD   10 mL at 03/26/17 0948   sodium chloride flush (NS) 0.9 % injection 10 mL  10 mL Intravenous PRN Sindy Guadeloupe, MD   10 mL at 04/16/17 1006   sodium chloride flush (NS) 0.9 % injection 10 mL  10 mL Intravenous PRN Sindy Guadeloupe, MD   10 mL at 01/10/18 1410   sodium chloride flush (NS) 0.9 % injection 10 mL  10 mL Intravenous PRN Lequita Asal, MD   10 mL at 12/11/18 1033     Allergies:   Patient has no known allergies.   Social History:  The patient  reports that she has quit smoking. Her smoking use included cigarettes. She has a 75.00 pack-year smoking history. She has never used smokeless tobacco. She reports that she does not drink alcohol and does not use drugs.   Family History:  family history includes CAD in her mother; Colon cancer in her brother.    Review of Systems: Review of Systems  Constitutional: Negative.   HENT: Negative.    Respiratory: Negative.    Cardiovascular: Negative.   Gastrointestinal: Negative.   Musculoskeletal: Negative.   Neurological: Negative.   Psychiatric/Behavioral: Negative.    All other systems reviewed and are negative.   PHYSICAL EXAM: VS:  There were no vitals taken for this visit. , BMI There is no height or weight on file to calculate BMI. GEN: Well nourished, well developed, in no acute distress HEENT: normal Neck: no JVD, carotid bruits, or masses Cardiac: RRR; no murmurs, rubs, or gallops,no edema  Respiratory:  clear to auscultation bilaterally, normal work of breathing GI: soft, nontender, nondistended, + BS MS: no deformity or atrophy Skin: warm and dry, no rash Neuro:  Strength and sensation are intact Psych: euthymic mood, full affect   Recent Labs: 09/08/2020: TSH 1.301 10/12/2020: B Natriuretic Peptide 1,331.8 11/20/2020: Magnesium 1.9 12/16/2020: ALT 11; BUN 18; Creatinine, Ser 0.67; Hemoglobin 9.0; Platelets 307; Potassium 3.5; Sodium 136    Lipid Panel Lab  Results  Component Value Date   CHOL 162 09/08/2020   HDL 49 09/08/2020   LDLCALC 93 09/08/2020   TRIG 98 09/08/2020     Wt Readings from Last 3 Encounters:  12/29/20 133 lb 7.8 oz (60.6 kg)  12/28/20 132 lb (59.9 kg)  11/20/20 147 lb (66.7 kg)     ASSESSMENT AND PLAN:  Problem List Items Addressed This Visit       Cardiology Problems   Chronic HFrEF (heart failure with reduced ejection fraction) (HCC) - Primary   PAD (peripheral artery disease) (HCC)   Hyperlipidemia   AAA (abdominal aortic aneurysm) without rupture   Chronic systolic CHF/cardiomyopathy Continue Lasix 3 days a week Blood pressure stable we will add metoprolol succinate 12.5 mg daily, losartan 25 mg daily Follow-up in 3 months  Peripheral arterial disease AAA, endovascular repair  History of falls, hip fracture Recent surgical repair 1 month ago, doing well in recovery  Aortic valve stenosis Medical management at this time, Periodic echocardiography for further evaluation on annual basis    Total encounter time more than 35 minutes  Greater than 50% was spent in counseling and coordination of care with the patient    Signed, Esmond Plants, M.D., Ph.D. [place], [place]

## 2021-04-04 ENCOUNTER — Ambulatory Visit: Payer: Medicare HMO | Admitting: Cardiovascular Disease

## 2021-04-04 DIAGNOSIS — I714 Abdominal aortic aneurysm, without rupture, unspecified: Secondary | ICD-10-CM

## 2021-04-04 DIAGNOSIS — I739 Peripheral vascular disease, unspecified: Secondary | ICD-10-CM

## 2021-04-04 DIAGNOSIS — E782 Mixed hyperlipidemia: Secondary | ICD-10-CM

## 2021-04-04 DIAGNOSIS — I5022 Chronic systolic (congestive) heart failure: Secondary | ICD-10-CM

## 2021-05-09 ENCOUNTER — Ambulatory Visit: Payer: Medicare HMO | Admitting: Cardiovascular Disease

## 2021-05-09 ENCOUNTER — Telehealth: Payer: Self-pay | Admitting: Cardiovascular Disease

## 2021-05-09 NOTE — Telephone Encounter (Signed)
Patient daughter came into office states that the new medication is giving her mom leg craps could not recall name, please call for name of meds & to discuss

## 2021-05-09 NOTE — Telephone Encounter (Signed)
Was able to reach back out to pt's daughter Roberta [name] (DPR approved), she reports Roberta [name] missed her appt today with Dr. Rockey Situ d/t transportation issues at the facility she resides at since her hip sx. Roberta [name] reports facility prefers that medical staff transport and not family d/t circumstances.   While visiting Roberta [name] this am, she reported leg cramps and soreness. Roberta [name] voiced to Roberta [name] she concern that it is from new meds started in Aug: metoprolol and losartan. Typically these two do not cause leg cramping or soreness, but pt is on a low dose statin drug "atorvastatin" 10 mg HS. This could be the cause to her symptoms, however pt is on low dose and has been on it for several years.   Roberta [name] thinks Roberta [name] leg symptoms are from her being more bed ridden since her hip sx, could be cause of cramping and soreness as they are not being used as prior to fall. Roberta [name] would like to see if therapy/rehab will help ease the discomfort, if not will discuss at upcoming appt that was reschedule to 01/03 with Dr. Rockey Situ. Rehab/Therapy should start this week and hopefully that will help Roberta [name].   Roberta [name] very tankful for the return call and wanted to give Roberta [name] of Roberta [name] as her appt was missed today, will call back with anything further and will see Roberta [name] at upcoming appt in 3 weeks.

## 2021-05-09 NOTE — Progress Notes (Deleted)
Cardiology Office Note  Date:  05/09/2021   ID:  [name], [name] 1945-10-26, MRN [id number]  PCP:  Gayland Curry, MD   No chief complaint on file.   HPI:  Roberta [name] is a 75 y.o. female with a hx of  cardiomyopathy of uncertain etiology,  lung cancer,  breast cancer,  moderate aortic stenosis and mitral regurgitation,  AAA s/p endovascular repair,  COPD,  tobacco use quitting in 09/2020,   HLD  who is being seen today for the evaluation of cardiomyopathy, systolic CHF  Last seen in clinic by myself August 2022   Recent procedures reviewed  endovascular repair of her AAA along with aortic, iliac, and right renal artery stenting.    admitted to [facility] in 09/2020 with progressive dyspnea and orthopnea with imaging showing pulmonary edema and a right pleural effusion.    Echo showed an EF of 25 to 30%.   GDMT was limited in the setting of relative hypotension.    admitted again in late 09/2020 with a mechanical fall and found to have a left subcapital hip fracture.  underwent surgical repair of her femoral neck fracture.  She was discharged to rehab   readmitted on 11/21/2020 with mild sepsis in the setting of UTI and diarrhea.   CT of the abdomen pelvis showed no acute pathology.  She has been treated with IV Rocephin    6/26 she was noted to be hypoxic with O2 saturation of 89% on room air leading to the discontinuation of IV fluids.   dose of IV Lasix with documented urine output  2.4 L for the past 24 hours and 2.5 L negative for the admission.   Chest x-ray showed no acute cardiopulmonary disease with stable posttreatment scarring on the right with superior right hilar retraction as well as hyperinflation consistent with known COPD.    In follow-up today she is recovering at [facility], doing well Family presents with her, denies significant chest pain, shortness of breath, no significant leg edema On Lasix 20 mg 3 days a week Denies\orthostasis  EKG  personally reviewed by myself on todays visit Normal sinus rhythm rate 86 bpm LVH  PMH:   has a past medical history of AAA (abdominal aortic aneurysm) ([place]), Anemia, Breast cancer ([place]) (2002), Cardiomyopathy ([place]), Chronic combined systolic and diastolic CHF (congestive heart failure) ([place]), COPD (chronic obstructive pulmonary disease) ([place]), Coronary artery calcification seen on CT scan, Dyspnea, Headache, High cholesterol, History of stress test, Mitral regurgitation, Moderate aortic stenosis, Personal history of chemotherapy, Small cell lung cancer ([place]), and Tobacco abuse.  PSH:    Past Surgical History:  Procedure Laterality Date   ABDOMINAL HYSTERECTOMY     ARTERY BIOPSY Right 12/05/2017   Procedure: BIOPSY TEMPORAL ARTERY;  Surgeon: Margaretha Sheffield, MD;  Location: [facility];  Service: ENT;  Laterality: Right;   ENDOBRONCHIAL ULTRASOUND N/A 02/19/2017   Procedure: ENDOBRONCHIAL ULTRASOUND;  Surgeon: Flora Lipps, MD;  Location: [facility];  Service: Cardiopulmonary;  Laterality: N/A;   ENDOVASCULAR REPAIR/STENT GRAFT N/A 12/06/2016   Procedure: Endovascular Repair/Stent Graft;  Surgeon: Katha Cabal, MD;  Location: [place] [facility];  Service: Cardiovascular;  Laterality: N/A;   HIP ARTHROPLASTY Left 10/26/2020   Procedure: ARTHROPLASTY BIPOLAR HIP (HEMIARTHROPLASTY);  Surgeon: Renee Harder, MD;  Location: [facility];  Service: Orthopedics;  Laterality: Left;   IR FLUORO GUIDE PORT INSERTION RIGHT  03/02/2017   KYPHOPLASTY N/A 06/11/2018   Procedure: KYPHOPLASTY L4 BIOPSY WITH RFA;  Surgeon: Hessie Knows, MD;  Location: [facility];  Service: Orthopedics;  Laterality: N/A;   KYPHOPLASTY N/A 12/16/2019   Procedure: T6 KYPHOPLASTY;  Surgeon: Hessie Knows, MD;  Location: [facility];  Service: Orthopedics;  Laterality: N/A;   MASTECTOMY Left 2002   with sentinel node     Current Outpatient Medications  Medication Sig Dispense Refill   acetaminophen (TYLENOL) 500 MG tablet Take 1,000 mg  by mouth every 8 (eight) hours as needed for mild pain.     albuterol (PROVENTIL HFA;VENTOLIN HFA) 108 (90 Base) MCG/ACT inhaler Inhale 2 puffs into the lungs every 6 (six) hours as needed for wheezing or shortness of breath. 1 Inhaler 1   aspirin EC 81 MG EC tablet Take 1 tablet (81 mg total) by mouth daily. Swallow whole. 30 tablet 11   atorvastatin (LIPITOR) 10 MG tablet Take 10 mg by mouth at bedtime.      fluticasone-salmeterol (ADVAIR HFA) 115-21 MCG/ACT inhaler Inhale 2 puffs into the lungs 2 (two) times daily.     furosemide (LASIX) 20 MG tablet Take 1 tablet (20 mg total) by mouth every Monday, Wednesday, and Friday. 30 tablet 0   ipratropium (ATROVENT) 0.02 % nebulizer solution Take 3 mLs by nebulization in the morning, at noon, in the evening, and at bedtime.     loperamide (IMODIUM) 2 MG capsule Take 1 capsule (2 mg total) by mouth as needed for diarrhea or loose stools. 30 capsule 0   losartan (COZAAR) 25 MG tablet Take 1 tablet (25 mg total) by mouth daily. 90 tablet 3   magnesium hydroxide (MILK OF MAGNESIA) 400 MG/5ML suspension Take 30 mLs by mouth daily as needed for mild constipation. 355 mL 0   melatonin 5 MG TABS Take 0.5 tablets (2.5 mg total) by mouth at bedtime.  0   metoprolol succinate (TOPROL XL) 25 MG 24 hr tablet Take 0.5 tablets (12.5 mg total) by mouth daily. 45 tablet 3   nicotine (NICODERM CQ - DOSED IN MG/24 HOURS) 14 mg/24hr patch Place 1 patch (14 mg total) onto the skin daily. 28 patch 0   omeprazole (PRILOSEC) 20 MG capsule Take 1 capsule (20 mg total) by mouth daily. 30 capsule 1   traZODone (DESYREL) 50 MG tablet Take 1 tablet (50 mg total) by mouth at bedtime.     vitamin B-12 (CYANOCOBALAMIN) 1000 MCG tablet Take 1 tablet (1,000 mcg total) by mouth daily. 30 tablet 3   No current facility-administered medications for this visit.   Facility-Administered Medications Ordered in Other Visits  Medication Dose Route Frequency Provider Last Rate Last Admin    sodium chloride flush (NS) 0.9 % injection 10 mL  10 mL Intravenous PRN Sindy Guadeloupe, MD   10 mL at 03/26/17 0948   sodium chloride flush (NS) 0.9 % injection 10 mL  10 mL Intravenous PRN Sindy Guadeloupe, MD   10 mL at 04/16/17 1006   sodium chloride flush (NS) 0.9 % injection 10 mL  10 mL Intravenous PRN Sindy Guadeloupe, MD   10 mL at 01/10/18 1410   sodium chloride flush (NS) 0.9 % injection 10 mL  10 mL Intravenous PRN Lequita Asal, MD   10 mL at 12/11/18 1033     Allergies:   Patient has no known allergies.   Social History:  The patient  reports that she has quit smoking. Her smoking use included cigarettes. She has a 75.00 pack-year smoking history. She has never used smokeless tobacco. She reports that she does not drink  alcohol and does not use drugs.   Family History:   family history includes CAD in her mother; Colon cancer in her brother.    Review of Systems: Review of Systems  Constitutional: Negative.   HENT: Negative.    Respiratory: Negative.    Cardiovascular: Negative.   Gastrointestinal: Negative.   Musculoskeletal: Negative.   Neurological: Negative.   Psychiatric/Behavioral: Negative.    All other systems reviewed and are negative.   PHYSICAL EXAM: VS:  There were no vitals taken for this visit. , BMI There is no height or weight on file to calculate BMI. GEN: Well nourished, well developed, in no acute distress HEENT: normal Neck: no JVD, carotid bruits, or masses Cardiac: RRR; no murmurs, rubs, or gallops,no edema  Respiratory:  clear to auscultation bilaterally, normal work of breathing GI: soft, nontender, nondistended, + BS MS: no deformity or atrophy Skin: warm and dry, no rash Neuro:  Strength and sensation are intact Psych: euthymic mood, full affect   Recent Labs: 09/08/2020: TSH 1.301 10/12/2020: B Natriuretic Peptide 1,331.8 11/20/2020: Magnesium 1.9 12/16/2020: ALT 11; BUN 18; Creatinine, Ser 0.67; Hemoglobin 9.0; Platelets 307;  Potassium 3.5; Sodium 136    Lipid Panel Lab Results  Component Value Date   CHOL 162 09/08/2020   HDL 49 09/08/2020   LDLCALC 93 09/08/2020   TRIG 98 09/08/2020     Wt Readings from Last 3 Encounters:  12/29/20 133 lb 7.8 oz (60.6 kg)  12/28/20 132 lb (59.9 kg)  11/20/20 147 lb (66.7 kg)     ASSESSMENT AND PLAN:  Problem List Items Addressed This Visit       Cardiology Problems   Chronic HFrEF (heart failure with reduced ejection fraction) (HCC)   PAD (peripheral artery disease) (HCC) - Primary   Hyperlipidemia   AAA (abdominal aortic aneurysm) without rupture   Chronic systolic CHF/cardiomyopathy Continue Lasix 3 days a week Blood pressure stable we will add metoprolol succinate 12.5 mg daily, losartan 25 mg daily Follow-up in 3 months  Peripheral arterial disease AAA, endovascular repair  History of falls, hip fracture Recent surgical repair 1 month ago, doing well in recovery  Aortic valve stenosis Medical management at this time, Periodic echocardiography for further evaluation on annual basis    Total encounter time more than 35 minutes  Greater than 50% was spent in counseling and coordination of care with the patient    Signed, Esmond Plants, M.D., Ph.D. [place], [place]

## 2021-05-15 ENCOUNTER — Encounter: Payer: Self-pay | Admitting: Oncology

## 2021-05-16 NOTE — Telephone Encounter (Signed)
Roberta [name],   Can you contact surgeon to have her port removed?  [name], NP 05/16/2021 3:42 PM

## 2021-05-24 ENCOUNTER — Other Ambulatory Visit: Payer: Self-pay

## 2021-05-24 NOTE — Progress Notes (Signed)
Error

## 2021-05-24 NOTE — Telephone Encounter (Signed)
I got the message and the orders were put in the computer. We changed the date for the d/c port and sent another paper to IR and usually they call us with date and time. I will give them 2 days and then I will check on IR to see what we can do. Thanks sherry

## 2021-05-30 NOTE — Progress Notes (Signed)
Cardiology Office Note  Date:  05/31/2021   ID:  Minnie, Legros 1945/06/18, MRN [id number]  PCP:  Gayland Curry, MD   Chief Complaint  Patient presents with   3 month follow up     Patient c/o restless leg syndrome. Medications reviewed by the patient verbally.      HPI:  LACI FRENKEL is a 76 y.o. female with a hx of  cardiomyopathy of uncertain etiology,  lung cancer,  breast cancer,  moderate aortic stenosis and mitral regurgitation,  AAA s/p endovascular repair,  COPD,  tobacco use quitting in 09/2020,   HLD  who is being seen today for the evaluation of cardiomyopathy, systolic CHF, PAD  Last seen in clinic by myself August 2022  Weight is stable at 138 to 140 pounds Family presents with her today, Reports that she is sedentary, No motivation to get up and ambulate, needs following Eating ok Has some jerking of her legs at night, family thinks that his restless leg  Denies claudication symptoms, no chest pain Continues to smoke approximately 3 cigarettes/day, 1 carton per month  Denies orthostasis symptoms blood pressure low today 90 systolic Thinks it is because she did not have any lunch, Was rushed getting over here  EKG personally reviewed by myself on todays visit Normal sinus rhythm rate 83 bpm no significant ST or T wave changes  Other past medical history reviewed endovascular repair of her AAA along with aortic, iliac, and right renal artery stenting.    admitted to [facility] in 09/2020 with progressive dyspnea and orthopnea with imaging showing pulmonary edema and a right pleural effusion.    Echo showed an EF of 25 to 30%.   GDMT was limited in the setting of relative hypotension.    admitted again in late 09/2020 with a mechanical fall and found to have a left subcapital hip fracture.  underwent surgical repair of her femoral neck fracture.  She was discharged to rehab   readmitted on 11/21/2020 with mild sepsis in the setting of UTI and  diarrhea.   CT of the abdomen pelvis showed no acute pathology.  She has been treated with IV Rocephin    6/26 she was noted to be hypoxic with O2 saturation of 89% on room air leading to the discontinuation of IV fluids.   dose of IV Lasix with documented urine output  2.4 L for the past 24 hours and 2.5 L negative for the admission.   Chest x-ray showed no acute cardiopulmonary disease with stable posttreatment scarring on the right with superior right hilar retraction as well as hyperinflation consistent with known COPD.    PMH:   has a past medical history of AAA (abdominal aortic aneurysm), Anemia, Breast cancer ([place]) (2002), Cardiomyopathy ([place]), Chronic combined systolic and diastolic CHF (congestive heart failure) ([place]), COPD (chronic obstructive pulmonary disease) ([place]), Coronary artery calcification seen on CT scan, Dyspnea, Headache, High cholesterol, History of stress test, Mitral regurgitation, Moderate aortic stenosis, Personal history of chemotherapy, Small cell lung cancer ([place]), and Tobacco abuse.  PSH:    Past Surgical History:  Procedure Laterality Date   ABDOMINAL HYSTERECTOMY     ARTERY BIOPSY Right 12/05/2017   Procedure: BIOPSY TEMPORAL ARTERY;  Surgeon: Margaretha Sheffield, MD;  Location: [facility] ORS;  Service: ENT;  Laterality: Right;   ENDOBRONCHIAL ULTRASOUND N/A 02/19/2017   Procedure: ENDOBRONCHIAL ULTRASOUND;  Surgeon: Flora Lipps, MD;  Location: [facility] ORS;  Service: Cardiopulmonary;  Laterality: N/A;   ENDOVASCULAR REPAIR/STENT GRAFT N/A  12/06/2016   Procedure: Endovascular Repair/Stent Graft;  Surgeon: Katha Cabal, MD;  Location: [facility];  Service: Cardiovascular;  Laterality: N/A;   HIP ARTHROPLASTY Left 10/26/2020   Procedure: ARTHROPLASTY BIPOLAR HIP (HEMIARTHROPLASTY);  Surgeon: Renee Harder, MD;  Location: [facility] ORS;  Service: Orthopedics;  Laterality: Left;   IR FLUORO GUIDE PORT INSERTION RIGHT  03/02/2017   KYPHOPLASTY N/A 06/11/2018    Procedure: KYPHOPLASTY L4 BIOPSY WITH RFA;  Surgeon: Hessie Knows, MD;  Location: [facility] ORS;  Service: Orthopedics;  Laterality: N/A;   KYPHOPLASTY N/A 12/16/2019   Procedure: T6 KYPHOPLASTY;  Surgeon: Hessie Knows, MD;  Location: [facility] ORS;  Service: Orthopedics;  Laterality: N/A;   MASTECTOMY Left 2002   with sentinel node     Current Outpatient Medications  Medication Sig Dispense Refill   acetaminophen (TYLENOL) 500 MG tablet Take 1,000 mg by mouth every 8 (eight) hours as needed for mild pain.     albuterol (PROVENTIL HFA;VENTOLIN HFA) 108 (90 Base) MCG/ACT inhaler Inhale 2 puffs into the lungs every 6 (six) hours as needed for wheezing or shortness of breath. 1 Inhaler 1   aspirin EC 81 MG EC tablet Take 1 tablet (81 mg total) by mouth daily. Swallow whole. 30 tablet 11   atorvastatin (LIPITOR) 10 MG tablet Take 10 mg by mouth at bedtime.      fluticasone-salmeterol (ADVAIR HFA) 115-21 MCG/ACT inhaler Inhale 2 puffs into the lungs 2 (two) times daily.     furosemide (LASIX) 20 MG tablet Take 1 tablet (20 mg total) by mouth every Monday, Wednesday, and Friday. 30 tablet 0   ipratropium (ATROVENT) 0.02 % nebulizer solution Take 3 mLs by nebulization in the morning, at noon, in the evening, and at bedtime.     loperamide (IMODIUM) 2 MG capsule Take 1 capsule (2 mg total) by mouth as needed for diarrhea or loose stools. 30 capsule 0   losartan (COZAAR) 25 MG tablet Take 1 tablet (25 mg total) by mouth daily. 90 tablet 3   magnesium hydroxide (MILK OF MAGNESIA) 400 MG/5ML suspension Take 30 mLs by mouth daily as needed for mild constipation. 355 mL 0   melatonin 5 MG TABS Take 0.5 tablets (2.5 mg total) by mouth at bedtime.  0   metoprolol succinate (TOPROL XL) 25 MG 24 hr tablet Take 0.5 tablets (12.5 mg total) by mouth daily. 45 tablet 3   omeprazole (PRILOSEC) 20 MG capsule Take 1 capsule (20 mg total) by mouth daily. 30 capsule 1   traZODone (DESYREL) 50 MG tablet Take 1 tablet (50 mg  total) by mouth at bedtime.     vitamin B-12 (CYANOCOBALAMIN) 1000 MCG tablet Take 1 tablet (1,000 mcg total) by mouth daily. 30 tablet 3   nicotine (NICODERM CQ - DOSED IN MG/24 HOURS) 14 mg/24hr patch Place 1 patch (14 mg total) onto the skin daily. (Patient not taking: Reported on 05/31/2021) 28 patch 0   No current facility-administered medications for this visit.   Facility-Administered Medications Ordered in Other Visits  Medication Dose Route Frequency Provider Last Rate Last Admin   sodium chloride flush (NS) 0.9 % injection 10 mL  10 mL Intravenous PRN Sindy Guadeloupe, MD   10 mL at 03/26/17 0948   sodium chloride flush (NS) 0.9 % injection 10 mL  10 mL Intravenous PRN Sindy Guadeloupe, MD   10 mL at 04/16/17 1006   sodium chloride flush (NS) 0.9 % injection 10 mL  10 mL Intravenous PRN Janese Banks,  Weston Anna, MD   10 mL at 01/10/18 1410   sodium chloride flush (NS) 0.9 % injection 10 mL  10 mL Intravenous PRN Lequita Asal, MD   10 mL at 12/11/18 1033     Allergies:   Patient has no known allergies.   Social History:  The patient  reports that she has quit smoking. Her smoking use included cigarettes. She has a 75.00 pack-year smoking history. She has never used smokeless tobacco. She reports that she does not drink alcohol and does not use drugs.   Family History:   family history includes CAD in her mother; Colon cancer in her brother.    Review of Systems: Review of Systems  Constitutional: Negative.   HENT: Negative.    Respiratory: Negative.    Cardiovascular: Negative.   Gastrointestinal: Negative.   Musculoskeletal: Negative.   Neurological: Negative.   Psychiatric/Behavioral: Negative.    All other systems reviewed and are negative.   PHYSICAL EXAM: VS:  BP 90/60 (BP Location: Left Arm, Patient Position: Sitting, Cuff Size: Normal)    Pulse 83    Ht 5\' 7"  (1.702 m)    Wt 141 lb (64 kg)    SpO2 96%    BMI 22.08 kg/m  , BMI Body mass index is 22.08  kg/m. Constitutional:  oriented to person, place, and time. No distress.  Presenting in a wheelchair, pale HENT:  Head: Grossly normal Eyes:  no discharge. No scleral icterus.  Neck: No JVD, no carotid bruits  Cardiovascular: Regular rate and rhythm, no murmurs appreciated Pulmonary/Chest: Clear to auscultation bilaterally, no wheezes or rails Abdominal: Soft.  no distension.  no tenderness.  Musculoskeletal: Normal range of motion Neurological:  normal muscle tone. Coordination normal. No atrophy Skin: Skin warm and dry Psychiatric: normal affect, pleasant   Recent Labs: 09/08/2020: TSH 1.301 10/12/2020: B Natriuretic Peptide 1,331.8 11/20/2020: Magnesium 1.9 12/16/2020: ALT 11; BUN 18; Creatinine, Ser 0.67; Hemoglobin 9.0; Platelets 307; Potassium 3.5; Sodium 136    Lipid Panel Lab Results  Component Value Date   CHOL 162 09/08/2020   HDL 49 09/08/2020   LDLCALC 93 09/08/2020   TRIG 98 09/08/2020     Wt Readings from Last 3 Encounters:  05/31/21 141 lb (64 kg)  12/29/20 133 lb 7.8 oz (60.6 kg)  12/28/20 132 lb (59.9 kg)     ASSESSMENT AND PLAN:  Problem List Items Addressed This Visit       Cardiology Problems   Chronic HFrEF (heart failure with reduced ejection fraction) (HCC)   PAD (peripheral artery disease) (HCC) - Primary   Hyperlipidemia   AAA (abdominal aortic aneurysm) without rupture  Chronic systolic CHF/cardiomyopathy Continue Lasix 3 days a week Followed by large-volume diuresis when she takes Lasix, legs weak, relatively immobile Pressure low but for now we will continue metoprolol succinate 12.5 mg daily, losartan 25 mg daily.  No room on blood pressure to add additional agents Repeat complete echocardiogram ordered In May 2022 EF 25 to 30%, significant stressors at that time Will also evaluate RV function  Peripheral arterial disease AAA, endovascular repair Denies claudication symptoms On aspirin Lipitor  History of falls, hip  fracture Completed surgical repair Stressed importance of walking daily, leg strengthening exercises  Aortic valve stenosis Medical management at this time, Moderate stenosis May 2022    Total encounter time more than 35 minutes  Greater than 50% was spent in counseling and coordination of care with the patient    Signed, Esmond Plants, M.D.,  Ph.D. [facility] [place], [place] [phone]

## 2021-05-31 ENCOUNTER — Other Ambulatory Visit: Payer: Self-pay

## 2021-05-31 ENCOUNTER — Encounter: Payer: Self-pay | Admitting: Cardiovascular Disease

## 2021-05-31 ENCOUNTER — Ambulatory Visit (INDEPENDENT_AMBULATORY_CARE_PROVIDER_SITE_OTHER): Payer: Medicare HMO | Admitting: Cardiovascular Disease

## 2021-05-31 ENCOUNTER — Encounter: Payer: Self-pay | Admitting: Hematology and Oncology

## 2021-05-31 VITALS — BP 90/60 | HR 83 | Ht 67.0 in | Wt 141.0 lb

## 2021-05-31 DIAGNOSIS — I5022 Chronic systolic (congestive) heart failure: Secondary | ICD-10-CM

## 2021-05-31 DIAGNOSIS — I714 Abdominal aortic aneurysm, without rupture, unspecified: Secondary | ICD-10-CM | POA: Diagnosis not present

## 2021-05-31 DIAGNOSIS — D508 Other iron deficiency anemias: Secondary | ICD-10-CM

## 2021-05-31 DIAGNOSIS — I739 Peripheral vascular disease, unspecified: Secondary | ICD-10-CM

## 2021-05-31 DIAGNOSIS — E782 Mixed hyperlipidemia: Secondary | ICD-10-CM

## 2021-05-31 DIAGNOSIS — I428 Other cardiomyopathies: Secondary | ICD-10-CM | POA: Diagnosis not present

## 2021-05-31 NOTE — Patient Instructions (Addendum)
Medication Instructions:  No changes  If you need a refill on your cardiac medications before your next appointment, please call your pharmacy.    Lab work: CBC, CMP, iron studies  If you have labs (blood work) drawn today and your tests are completely normal, you will receive your results only by: [place] (if you have MyChart) OR A paper copy in the mail If you have any lab test that is abnormal or we need to change your treatment, we will call you to review the results.   Testing/Procedures: Your physician has requested that you have an echocardiogram. Echocardiography is a painless test that uses sound waves to create images of your heart. It provides your doctor with information about the size and shape of your heart and how well your hearts chambers and valves are working. This procedure takes approximately one hour. There are no restrictions for this procedure.  There is a possibility that an IV may need to be started during your test to inject an image enhancing agent. This is done to obtain more optimal pictures of your heart. Therefore we ask that you do at least drink some water prior to coming in to hydrate your veins.    Follow-Up: At [facility], you and your health needs are our priority.  As part of our continuing mission to provide you with exceptional heart care, we have created designated Provider Care Teams.  These Care Teams include your primary Cardiologist (physician) and Advanced Practice Providers (APPs -  Physician Assistants and Nurse Practitioners) who all work together to provide you with the care you need, when you need it.  You will need a follow up appointment in 6 months  Providers on your designated Care Team:   Murray Hodgkins, NP Christell Faith, PA-C Cadence Kathlen Mody, [place]  COVID-19 Vaccine Information can be found at: ShippingScam.co.uk For questions related to vaccine distribution or  appointments, please email vaccine@[place] .com or call [phone].

## 2021-06-02 ENCOUNTER — Telehealth: Payer: Self-pay

## 2021-06-02 DIAGNOSIS — R5383 Other fatigue: Secondary | ICD-10-CM

## 2021-06-02 DIAGNOSIS — I428 Other cardiomyopathies: Secondary | ICD-10-CM

## 2021-06-02 DIAGNOSIS — I5022 Chronic systolic (congestive) heart failure: Secondary | ICD-10-CM

## 2021-06-02 DIAGNOSIS — D649 Anemia, unspecified: Secondary | ICD-10-CM

## 2021-06-02 DIAGNOSIS — Z79899 Other long term (current) drug therapy: Secondary | ICD-10-CM

## 2021-06-02 NOTE — Telephone Encounter (Signed)
Per [facility] patients blood will not be resulted and will need to be recollected.

## 2021-06-02 NOTE — Telephone Encounter (Signed)
Was able to reach out to Roberta [name]'s daughter Roberta [name] [facility] approved). Advised need to repeat labs drawn at last OV this past Tuesday, explained there was lab error at [facility] and they request redraw.  Roberta [name] verbalized understanding, Roberta [name] lives a SNF, will need transportation to have her come in for labs. Pt does have an appt for ECHO on 1/30 at 10:30 am, will make same day lab appt that day at 10:00 for pt convenience. Roberta [name] very thankful and reaching out regarding matter.   Repeat labs 1/30 at 10:00 am CBC CMP Iron Ferritin .

## 2021-06-05 ENCOUNTER — Encounter: Payer: Self-pay | Admitting: *Deleted

## 2021-06-09 NOTE — Progress Notes (Signed)
Patient on schedule for Port removal 06/14/2021, called and spoke with [name] on phone with pre procedure instructions given. Made aware to be here At 0900, NPO after MN prior to procedure. And driver post procedure/recovery/discharge, stated understanding. Attempted to contact [facility] regarding instructions, left message for them to call back with questions regarding instructions.

## 2021-06-13 ENCOUNTER — Other Ambulatory Visit: Payer: Self-pay | Admitting: Radiology

## 2021-06-14 ENCOUNTER — Encounter: Payer: Self-pay | Admitting: Radiology

## 2021-06-14 ENCOUNTER — Ambulatory Visit
Admission: RE | Admit: 2021-06-14 | Discharge: 2021-06-14 | Disposition: A | Discharge status: Home / Self Care | Payer: Medicare HMO | Source: Ambulatory Visit | Attending: Oncology | Admitting: Oncology

## 2021-06-14 ENCOUNTER — Other Ambulatory Visit: Payer: Self-pay

## 2021-06-14 DIAGNOSIS — Z85118 Personal history of other malignant neoplasm of bronchus and lung: Secondary | ICD-10-CM | POA: Insufficient documentation

## 2021-06-14 DIAGNOSIS — Z08 Encounter for follow-up examination after completed treatment for malignant neoplasm: Secondary | ICD-10-CM

## 2021-06-14 DIAGNOSIS — Z452 Encounter for adjustment and management of vascular access device: Secondary | ICD-10-CM | POA: Diagnosis present

## 2021-06-14 DIAGNOSIS — Z9221 Personal history of antineoplastic chemotherapy: Secondary | ICD-10-CM | POA: Insufficient documentation

## 2021-06-14 HISTORY — PX: IR REMOVAL TUN ACCESS W/ PORT W/O FL MOD SED: IMG2290

## 2021-06-14 MED ORDER — FENTANYL CITRATE (PF) 100 MCG/2ML IJ SOLN
INTRAMUSCULAR | Status: AC
  Filled 2021-06-14: qty 2

## 2021-06-14 MED ORDER — LIDOCAINE-EPINEPHRINE 1 %-1:100000 IJ SOLN
INTRAMUSCULAR | Status: AC
  Administered 2021-06-14: 8 mL
  Filled 2021-06-14: qty 1

## 2021-06-14 MED ORDER — MIDAZOLAM HCL 2 MG/2ML IJ SOLN
INTRAMUSCULAR | Status: AC
  Filled 2021-06-14: qty 2

## 2021-06-14 MED ORDER — FENTANYL CITRATE (PF) 100 MCG/2ML IJ SOLN
INTRAMUSCULAR | Status: AC | PRN
  Administered 2021-06-14: 50 ug via INTRAVENOUS

## 2021-06-14 MED ORDER — MIDAZOLAM HCL 2 MG/2ML IJ SOLN
INTRAMUSCULAR | Status: AC | PRN
  Administered 2021-06-14: .5 mg via INTRAVENOUS

## 2021-06-14 MED ORDER — SODIUM CHLORIDE 0.9 % IV SOLN
INTRAVENOUS | Status: DC
  Filled 2021-06-14: qty 1000

## 2021-06-14 NOTE — Progress Notes (Signed)
Patient clinically stable post Port removal per Dr El-Abd,tolerated well. Denies complaints post procedure. Awake/alert and oriented post procedure. Received Versed 0.5 mg along with Fentanyl 50 mcg IV for procedure. Report given to [facility] post procedure.

## 2021-06-14 NOTE — H&P (Signed)
Chief Complaint: Patient was seen in consultation today for port removal at the request of [place] [facility]  Referring Physician(s): Rao,Archana C  Supervising Physician: Juliet Rude  Patient Status: [facility] - Out-pt  History of Present Illness: Roberta [name] is a 76 y.o. female with PMHx significant for lung cancer who follows with Dr. Janese Banks and has underwent treatment no longer requiring treatment with imaging done and no evidence of recurrence. The patient had a left sided port a catheter placed by [place] in 2018 and request now received for removal.   The patient denies any current chest pain or shortness of breath. She denies any current blood thinner use, denies any known bleeding or clotting disorder. The patient denies any recent infections, fever or chills. The patient denies any history of sleep apnea or chronic oxygen use. She has no known complications to sedation.    Past Medical History:  Diagnosis Date   AAA (abdominal aortic aneurysm)    Anemia    Breast cancer ([place]) 2002   left   Cardiomyopathy ([place])    a. 09/2020 Echo: EF 25-30%, glob HK, Gr2 DD, mod reduced RV fxn. Mod dil LA. Mild to mod MR. Mod AS.   Chronic combined systolic and diastolic CHF (congestive heart failure) ([place])    a. 09/2020 Echo: EF 25-30%, glob HK, gr2 DD.   COPD (chronic obstructive pulmonary disease) (HCC)    Coronary artery calcification seen on CT scan    Dyspnea    Headache    High cholesterol    History of stress test    a. 10/2016 Low risk MV. EF 60%.   Mitral regurgitation    Moderate aortic stenosis    Personal history of chemotherapy    Small cell lung cancer ([place])    Tobacco abuse     Past Surgical History:  Procedure Laterality Date   ABDOMINAL HYSTERECTOMY     ARTERY BIOPSY Right 12/05/2017   Procedure: BIOPSY TEMPORAL ARTERY;  Surgeon: Margaretha Sheffield, MD;  Location: [facility] ORS;  Service: ENT;  Laterality: Right;   ENDOBRONCHIAL ULTRASOUND N/A 02/19/2017   Procedure:  ENDOBRONCHIAL ULTRASOUND;  Surgeon: Flora Lipps, MD;  Location: [facility] ORS;  Service: Cardiopulmonary;  Laterality: N/A;   ENDOVASCULAR REPAIR/STENT GRAFT N/A 12/06/2016   Procedure: Endovascular Repair/Stent Graft;  Surgeon: Katha Cabal, MD;  Location: [facility];  Service: Cardiovascular;  Laterality: N/A;   HIP ARTHROPLASTY Left 10/26/2020   Procedure: ARTHROPLASTY BIPOLAR HIP (HEMIARTHROPLASTY);  Surgeon: Renee Harder, MD;  Location: [facility] ORS;  Service: Orthopedics;  Laterality: Left;   IR FLUORO GUIDE PORT INSERTION RIGHT  03/02/2017   KYPHOPLASTY N/A 06/11/2018   Procedure: KYPHOPLASTY L4 BIOPSY WITH RFA;  Surgeon: Hessie Knows, MD;  Location: [facility] ORS;  Service: Orthopedics;  Laterality: N/A;   KYPHOPLASTY N/A 12/16/2019   Procedure: T6 KYPHOPLASTY;  Surgeon: Hessie Knows, MD;  Location: [facility] OR[facility];  Service: Orthopedics;  Laterality: N/A;   MASTECTOMY Left 2002   with sentinel node     Allergies: Patient has no known allergies.  Medications: Prior to Admission medications   Medication Sig Start Date End Date Taking? Authorizing Provider  acetaminophen (TYLENOL) 500 MG tablet Take 1,000 mg by mouth every 8 (eight) hours as needed for mild pain.   Yes [provider]  aspirin EC 81 MG EC tablet Take 1 tablet (81 mg total) by mouth daily. Swallow whole. 09/09/20  Yes Bonnielee Haff, MD  fluticasone-salmeterol (ADVAIR HFA) [phone] MCG/ACT inhaler Inhale 2 puffs  into the lungs 2 (two) times daily.   Yes [provider]  furosemide (LASIX) 20 MG tablet Take 1 tablet (20 mg total) by mouth every Monday, Wednesday, and Friday. 10/18/20  Yes Sreenath, Sudheer B, MD  ipratropium (ATROVENT) 0.02 % nebulizer solution Take 3 mLs by nebulization in the morning, at noon, in the evening, and at bedtime. 10/07/20 10/07/21 Yes [provider]  losartan (COZAAR) 25 MG tablet Take 1 tablet (25 mg total) by mouth daily. 12/28/20 06/14/21 Yes Gollan, Kathlene November, MD   metoprolol succinate (TOPROL XL) 25 MG 24 hr tablet Take 0.5 tablets (12.5 mg total) by mouth daily. 12/28/20  Yes Minna Merritts, MD  omeprazole (PRILOSEC) 20 MG capsule Take 1 capsule (20 mg total) by mouth daily. 11/22/20  Yes Sindy Guadeloupe, MD  rOPINIRole (REQUIP) 0.5 MG tablet Take 0.5 mg by mouth at bedtime.   Yes [provider]  vitamin B-12 (CYANOCOBALAMIN) 1000 MCG tablet Take 1 tablet (1,000 mcg total) by mouth daily. 09/09/20  Yes Bonnielee Haff, MD  albuterol (PROVENTIL HFA;VENTOLIN HFA) 108 (90 Base) MCG/ACT inhaler Inhale 2 puffs into the lungs every 6 (six) hours as needed for wheezing or shortness of breath. 03/23/16   Frederich Cha, MD  atorvastatin (LIPITOR) 10 MG tablet Take 10 mg by mouth at bedtime.  08/11/16   [provider]  loperamide (IMODIUM) 2 MG capsule Take 1 capsule (2 mg total) by mouth as needed for diarrhea or loose stools. 11/25/20   Terrilee Croak, MD  magnesium hydroxide (MILK OF MAGNESIA) 400 MG/5ML suspension Take 30 mLs by mouth daily as needed for mild constipation. 11/25/20   Dahal, Marlowe Aschoff, MD  melatonin 5 MG TABS Take 0.5 tablets (2.5 mg total) by mouth at bedtime. 11/25/20   Dahal, Marlowe Aschoff, MD  nicotine (NICODERM CQ - DOSED IN MG/24 HOURS) 14 mg/24hr patch Place 1 patch (14 mg total) onto the skin daily. Patient not taking: Reported on 05/31/2021 10/18/20   Sidney Ace, MD  traZODone (DESYREL) 50 MG tablet Take 1 tablet (50 mg total) by mouth at bedtime. 11/25/20   Terrilee Croak, MD     Family History  Problem Relation Age of Onset   CAD Mother    Colon cancer Brother    Breast cancer Neg Hx     Social History   Socioeconomic History   Marital status: Widowed    Spouse name: Not on file   Number of children: Not on file   Years of education: Not on file   Highest education level: Not on file  Occupational History   Not on file  Tobacco Use   Smoking status: Former    Packs/day: 1.50    Years: 50.00    Pack years: 75.00     Types: Cigarettes   Smokeless tobacco: Never  Vaping Use   Vaping Use: Never used  Substance and Sexual Activity   Alcohol use: No   Drug use: No   Sexual activity: Never  Other Topics Concern   Not on file  Social History Narrative   Not on file   Social Determinants of Health   Financial Resource Strain: Not on file  Food Insecurity: Not on file  Transportation Needs: Not on file  Physical Activity: Not on file  Stress: Not on file  Social Connections: Not on file   Review of Systems: A 12 point ROS discussed and pertinent positives are indicated in the HPI above.  All other systems are negative.  Review of Systems  Vital Signs: BP 111/60    Pulse 85    Temp 97.7 F (36.5 C) (Oral)    Resp 20    Ht 5\' 7"  (1.702 m)    Wt 138 lb (62.6 kg)    SpO2 100%    BMI 21.61 kg/m   Physical Exam Constitutional:      Appearance: Normal appearance.  Cardiovascular:     Rate and Rhythm: Normal rate and regular rhythm.  Pulmonary:     Effort: Pulmonary effort is normal. No respiratory distress.     Comments: Coarse BS bilaterally  Skin:    General: Skin is warm and dry.  Neurological:     Mental Status: She is alert and oriented to person, place, and time.  Left sided chest port a catheter in place with no surrounding skin changes  Imaging: No results found.  Labs:  CBC: Recent Labs    11/20/20 1508 11/21/20 0251 11/22/20 0713 12/16/20 0959  WBC 8.8 9.0 5.3 8.2  HGB 9.6* 9.9* 10.8* 9.0*  HCT 31.4* 31.9* 35.2* 29.2*  PLT 443* 421* 358 307    COAGS: Recent Labs    10/13/20 0223 10/24/20 0459 11/21/20 0251  INR 1.0 0.9 1.0  APTT 36 23* 23*    BMP: Recent Labs    11/21/20 0251 11/22/20 0713 11/23/20 0506 12/16/20 0959  NA 134* 135 133* 136  K 3.6 3.5 3.4* 3.5  CL 98 98 93* 101  CO2 27 27 30 28   GLUCOSE 88 95 102* 108*  BUN 16 16 15 18   CALCIUM 8.2* 8.2* 8.2* 8.5*  CREATININE 0.82 0.75 1.05* 0.67  GFRNONAA >60 >60 56* >60    LIVER FUNCTION  TESTS: Recent Labs    10/13/20 0223 10/24/20 0459 11/20/20 1508 12/16/20 0959  BILITOT 0.8 0.7 0.5 0.6  AST 22 30 22 18   ALT 16 55* 14 11  ALKPHOS 94 79 178* 99  PROT 6.4* 6.5 5.6* 6.0*  ALBUMIN 3.0* 3.0* 2.6* 3.0*    Assessment and Plan: 76 year old female with PMHx significant for small cell lung cancer diagnosed in 2018, patient follows with Dr. Janese Banks and has underwent treatment and is no longer requiring treatment. Recent imaging done 11/2020 and no evidence of recurrence. The patient had a left sided port a catheter placed by [place] in 2018 and request now received for removal.   The patient has been NPO, no blood thinners taken, labs and vitals have been reviewed.  Risks and benefits of port removal was discussed with the patient and/or patient's family including, but not limited to bleeding, infection, damage to adjacent structures.  All of the questions were answered and there is agreement to proceed.  Consent signed and in chart.  Thank you for this interesting consult.  I greatly enjoyed meeting Roberta [name] and look forward to participating in their care.  A copy of this report was sent to the requesting provider on this date.  Electronically Signed: Hedy Jacob, PA-C 06/14/2021, 10:28 AM   I spent a total of 15 Minutes  in face to face in clinical consultation, greater than 50% of which was counseling/coordinating care for port removal.

## 2021-06-27 ENCOUNTER — Other Ambulatory Visit: Payer: Self-pay

## 2021-06-27 ENCOUNTER — Other Ambulatory Visit: Payer: Medicare HMO

## 2021-06-27 ENCOUNTER — Ambulatory Visit (INDEPENDENT_AMBULATORY_CARE_PROVIDER_SITE_OTHER): Payer: Medicare HMO

## 2021-06-27 ENCOUNTER — Encounter: Payer: Self-pay | Admitting: Cardiovascular Disease

## 2021-06-27 DIAGNOSIS — I5022 Chronic systolic (congestive) heart failure: Secondary | ICD-10-CM

## 2021-06-27 DIAGNOSIS — I428 Other cardiomyopathies: Secondary | ICD-10-CM

## 2021-06-27 LAB — ECHOCARDIOGRAM COMPLETE
AR max vel: 1.72 cm2
AV Area VTI: 1.53 cm2
AV Area mean vel: 1.51 cm2
AV Mean grad: 9.5 mmHg
AV Peak grad: 18.6 mmHg
Ao pk vel: 2.16 m/s
Area-P 1/2: 4.89 cm2
Calc EF: 49.4 %
S' Lateral: 4 cm
Single Plane A2C EF: 49 %
Single Plane A4C EF: 49.2 %

## 2021-06-30 ENCOUNTER — Other Ambulatory Visit: Payer: Self-pay | Admitting: *Deleted

## 2021-06-30 DIAGNOSIS — E538 Deficiency of other specified B group vitamins: Secondary | ICD-10-CM

## 2021-06-30 DIAGNOSIS — D649 Anemia, unspecified: Secondary | ICD-10-CM

## 2021-07-01 ENCOUNTER — Other Ambulatory Visit: Payer: Medicare HMO

## 2021-07-03 ENCOUNTER — Encounter: Payer: Self-pay | Admitting: Oncology

## 2021-07-04 ENCOUNTER — Telehealth: Payer: Self-pay

## 2021-07-04 NOTE — Telephone Encounter (Signed)
Able to reach pt's daughter [name] (DPR approved) regarding Mrs. Lalani recent ECHO,  Dr. Rockey Situ had a chance to review her results and advised   "Echocardiogram  Ejection fraction 45 to 50%  Right ventricle normal size and function mild to moderate mitral valve regurgitation  Mild to moderate tricuspid valve regurgitation  Mild aortic valve stenosis   Compared to prior echocardiogram ejection fraction has improved, previously 25 to 30%"  [name] very thankful for the phone call of her mother's results, all questions and concerns were address with nothing further at this time. Will see at next schedule f/u appt.

## 2021-07-06 ENCOUNTER — Ambulatory Visit: Payer: Medicare HMO

## 2021-07-07 ENCOUNTER — Other Ambulatory Visit: Payer: Medicare HMO

## 2021-07-07 ENCOUNTER — Ambulatory Visit: Payer: Medicare HMO

## 2021-07-13 ENCOUNTER — Ambulatory Visit: Payer: Medicare HMO | Admitting: Oncology

## 2021-07-21 ENCOUNTER — Other Ambulatory Visit: Payer: Self-pay

## 2021-07-21 ENCOUNTER — Emergency Department: Payer: Medicare HMO

## 2021-07-21 ENCOUNTER — Encounter: Payer: Self-pay | Admitting: Emergency Medicine

## 2021-07-21 ENCOUNTER — Inpatient Hospital Stay
Admission: EM | Admit: 2021-07-21 | Discharge: 2021-07-23 | DRG: 809 | Disposition: A | Discharge status: Skilled Nursing Facility | Payer: Medicare HMO | Attending: Family Medicine | Admitting: Family Medicine

## 2021-07-21 DIAGNOSIS — E785 Hyperlipidemia, unspecified: Secondary | ICD-10-CM | POA: Diagnosis present

## 2021-07-21 DIAGNOSIS — D696 Thrombocytopenia, unspecified: Secondary | ICD-10-CM | POA: Diagnosis present

## 2021-07-21 DIAGNOSIS — Z66 Do not resuscitate: Secondary | ICD-10-CM | POA: Diagnosis present

## 2021-07-21 DIAGNOSIS — I959 Hypotension, unspecified: Secondary | ICD-10-CM | POA: Diagnosis present

## 2021-07-21 DIAGNOSIS — I714 Abdominal aortic aneurysm, without rupture, unspecified: Secondary | ICD-10-CM | POA: Diagnosis present

## 2021-07-21 DIAGNOSIS — R053 Chronic cough: Secondary | ICD-10-CM | POA: Diagnosis present

## 2021-07-21 DIAGNOSIS — D61818 Other pancytopenia: Principal | ICD-10-CM | POA: Diagnosis present

## 2021-07-21 DIAGNOSIS — E7800 Pure hypercholesterolemia, unspecified: Secondary | ICD-10-CM | POA: Diagnosis present

## 2021-07-21 DIAGNOSIS — Z9221 Personal history of antineoplastic chemotherapy: Secondary | ICD-10-CM

## 2021-07-21 DIAGNOSIS — I2510 Atherosclerotic heart disease of native coronary artery without angina pectoris: Secondary | ICD-10-CM | POA: Diagnosis present

## 2021-07-21 DIAGNOSIS — N179 Acute kidney failure, unspecified: Secondary | ICD-10-CM | POA: Diagnosis present

## 2021-07-21 DIAGNOSIS — Z9981 Dependence on supplemental oxygen: Secondary | ICD-10-CM

## 2021-07-21 DIAGNOSIS — I10 Essential (primary) hypertension: Secondary | ICD-10-CM | POA: Diagnosis present

## 2021-07-21 DIAGNOSIS — I5042 Chronic combined systolic (congestive) and diastolic (congestive) heart failure: Secondary | ICD-10-CM | POA: Diagnosis present

## 2021-07-21 DIAGNOSIS — K219 Gastro-esophageal reflux disease without esophagitis: Secondary | ICD-10-CM | POA: Diagnosis present

## 2021-07-21 DIAGNOSIS — C3411 Malignant neoplasm of upper lobe, right bronchus or lung: Secondary | ICD-10-CM | POA: Diagnosis present

## 2021-07-21 DIAGNOSIS — D638 Anemia in other chronic diseases classified elsewhere: Secondary | ICD-10-CM | POA: Diagnosis present

## 2021-07-21 DIAGNOSIS — D519 Vitamin B12 deficiency anemia, unspecified: Secondary | ICD-10-CM | POA: Diagnosis present

## 2021-07-21 DIAGNOSIS — Z85118 Personal history of other malignant neoplasm of bronchus and lung: Secondary | ICD-10-CM

## 2021-07-21 DIAGNOSIS — Z800 Family history of malignant neoplasm of digestive organs: Secondary | ICD-10-CM

## 2021-07-21 DIAGNOSIS — Z20822 Contact with and (suspected) exposure to covid-19: Secondary | ICD-10-CM | POA: Diagnosis present

## 2021-07-21 DIAGNOSIS — J449 Chronic obstructive pulmonary disease, unspecified: Secondary | ICD-10-CM | POA: Diagnosis present

## 2021-07-21 DIAGNOSIS — D649 Anemia, unspecified: Secondary | ICD-10-CM | POA: Diagnosis present

## 2021-07-21 DIAGNOSIS — Z8249 Family history of ischemic heart disease and other diseases of the circulatory system: Secondary | ICD-10-CM

## 2021-07-21 DIAGNOSIS — Z923 Personal history of irradiation: Secondary | ICD-10-CM

## 2021-07-21 DIAGNOSIS — Z87891 Personal history of nicotine dependence: Secondary | ICD-10-CM

## 2021-07-21 DIAGNOSIS — Z9012 Acquired absence of left breast and nipple: Secondary | ICD-10-CM

## 2021-07-21 DIAGNOSIS — Z853 Personal history of malignant neoplasm of breast: Secondary | ICD-10-CM

## 2021-07-21 DIAGNOSIS — I110 Hypertensive heart disease with heart failure: Secondary | ICD-10-CM | POA: Diagnosis present

## 2021-07-21 DIAGNOSIS — Z96642 Presence of left artificial hip joint: Secondary | ICD-10-CM | POA: Diagnosis present

## 2021-07-21 DIAGNOSIS — Z7982 Long term (current) use of aspirin: Secondary | ICD-10-CM

## 2021-07-21 DIAGNOSIS — E871 Hypo-osmolality and hyponatremia: Secondary | ICD-10-CM | POA: Diagnosis present

## 2021-07-21 DIAGNOSIS — C9500 Acute leukemia of unspecified cell type not having achieved remission: Secondary | ICD-10-CM | POA: Diagnosis present

## 2021-07-21 DIAGNOSIS — F32A Depression, unspecified: Secondary | ICD-10-CM | POA: Diagnosis present

## 2021-07-21 DIAGNOSIS — J9611 Chronic respiratory failure with hypoxia: Secondary | ICD-10-CM | POA: Diagnosis present

## 2021-07-21 DIAGNOSIS — Z79899 Other long term (current) drug therapy: Secondary | ICD-10-CM

## 2021-07-21 DIAGNOSIS — I429 Cardiomyopathy, unspecified: Secondary | ICD-10-CM | POA: Diagnosis present

## 2021-07-21 LAB — BASIC METABOLIC PANEL
Anion gap: 7 (ref 5–15)
BUN: 53 mg/dL — ABNORMAL HIGH (ref 8–23)
CO2: 24 mmol/L (ref 22–32)
Calcium: 7.1 mg/dL — ABNORMAL LOW (ref 8.9–10.3)
Chloride: 103 mmol/L (ref 98–111)
Creatinine, Ser: 1.74 mg/dL — ABNORMAL HIGH (ref 0.44–1.00)
GFR, Estimated: 30 mL/min — ABNORMAL LOW (ref >60)
Glucose, Bld: 119 mg/dL — ABNORMAL HIGH (ref 70–99)
Potassium: 3.9 mmol/L (ref 3.5–5.1)
Sodium: 134 mmol/L — ABNORMAL LOW (ref 135–145)

## 2021-07-21 LAB — CBC
HCT: 10.9 % — ABNORMAL LOW (ref 36.0–46.0)
Hemoglobin: 3.3 g/dL — CL (ref 12.0–15.0)
MCH: 32.7 pg (ref 26.0–34.0)
MCHC: 30.3 g/dL (ref 30.0–36.0)
MCV: 107.9 fL — ABNORMAL HIGH (ref 80.0–100.0)
Platelets: 27 10*3/uL — CL (ref 150–400)
RBC: 1.01 MIL/uL — ABNORMAL LOW (ref 3.87–5.11)
RDW: 20.4 % — ABNORMAL HIGH (ref 11.5–15.5)
WBC: 5.3 10*3/uL (ref 4.0–10.5)
nRBC: 2.5 % — ABNORMAL HIGH (ref 0.0–0.2)

## 2021-07-21 MED ORDER — SODIUM CHLORIDE 0.9 % IV SOLN
10.0000 mL/h | Freq: Once | INTRAVENOUS | Status: AC
  Administered 2021-07-21 (×2): 10 mL/h via INTRAVENOUS

## 2021-07-21 MED ORDER — SODIUM CHLORIDE 0.9 % IV SOLN
10.0000 mL/h | Freq: Once | INTRAVENOUS | Status: AC
  Administered 2021-07-21: 10 mL/h via INTRAVENOUS

## 2021-07-21 NOTE — ED Notes (Signed)
Patient transported to CT 

## 2021-07-21 NOTE — ED Triage Notes (Signed)
Pt arrived via [facility] from [place] started on oxygen yesterday, c/o feeling cold, was sent to ED tonight due to hemoglobin of 3, pt hypotensive with EMS 93/37, received 1L NS with EMS.

## 2021-07-21 NOTE — ED Notes (Signed)
Per Dr Jacqualine Code run [facility] first unit emergent blood wide open

## 2021-07-21 NOTE — ED Notes (Signed)
Notified Dr. Jacqualine Code of patient's critical labs: Hgb 3.3, Hematocrit 10.9, and platelets 27.

## 2021-07-21 NOTE — ED Notes (Signed)
Pt tolerated first unit of blood well, no s/s of reaction

## 2021-07-21 NOTE — ED Provider Notes (Signed)
[facility] Provider Note    Event Date/Time   First MD Initiated Contact with Patient 07/21/21 2224     (approximate)   History   Anemia   HPI  DEVEN AUDI is a 76 y.o. female history of cardiomyopathy, previous lung cancer, coronary disease COPD CHF  She has been developing weakness fatigue and increasing shortness of breath over about 1 week's time.  She had labs drawn that resulted with a hemoglobin of less than 5.  I called and confirmed with her healthcare facility and they advised she had a hemoglobin of 3.8.  Based on this they have felt that she needed to be evaluated in the ER and EMS was called.  EMS advised patient was hypotensive, given fluid bolus.  Facility advises currently patient takes baby aspirin, midodrine, and metoprolol  Patient denies being in pain.  Reports fatigue shortness of breath worsening over about 1 week's time.  No cough no fevers no chills.  No nausea or vomiting  The patient has a gold DNR form with her signed by her clinician  Past Medical History:  Diagnosis Date   AAA (abdominal aortic aneurysm)    Anemia    Breast cancer ([place]) 2002   left   Cardiomyopathy ([place])    a. 09/2020 Echo: EF 25-30%, glob HK, Gr2 DD, mod reduced RV fxn. Mod dil LA. Mild to mod MR. Mod AS.   Chronic combined systolic and diastolic CHF (congestive heart failure) ([place])    a. 09/2020 Echo: EF 25-30%, glob HK, gr2 DD.   COPD (chronic obstructive pulmonary disease) (HCC)    Coronary artery calcification seen on CT scan    Dyspnea    Headache    High cholesterol    History of stress test    a. 10/2016 Low risk MV. EF 60%.   Mitral regurgitation    Moderate aortic stenosis    Personal history of chemotherapy    Small cell lung cancer (HCC)    Tobacco abuse           Physical Exam   Triage Vital Signs: ED Triage Vitals  Enc Vitals Group     BP 07/21/21 2216 (!) 83/39     Pulse Rate 07/21/21 2216 (!) 59     Resp --       Temp 07/21/21 2216 97.8 F (36.6 C)     Temp Source 07/21/21 2216 Oral     SpO2 07/21/21 2216 99 %     Weight 07/21/21 2215 143 lb 4.8 oz (65 kg)     Height 07/21/21 2215 5\' 7"  (1.702 m)     Head Circumference --      Peak Flow --      Pain Score 07/21/21 2214 0     Pain Loc --      Pain Edu? --      Excl. in [place]? --     Most recent vital signs: Vitals:   07/21/21 2355 07/22/21 0001  BP: (!) 89/40 (!) 82/39  Pulse: 82 81  Resp: (!) 21 20  Temp:  98 F (36.7 C)  SpO2: 100% 100%     General: Awake, no distress.  Appears pale, ill but in no acute distress.  She is alert and very well oriented.  I discussed that her chart mentions that she has a history of dementia from her nursing facility, but patient's daughter reports that was actually due to a B12 or vitamin deficiency and that the  patient is been alert well oriented and does not have dementia CV:  Good peripheral perfusion.  Normal heart rate.  Normal heart tones no murmurs or gallops. Resp:  Normal effort.  Normal rate.  Clear lung sounds bilaterally. Abd:  No distention.  Denies pain to palpation in any quadrant. Other:  No lower extremity edema Complexion very pale.  Noted hypotension   ED Results / Procedures / Treatments   Labs (all labs ordered are listed, but only abnormal results are displayed) Labs Reviewed  BASIC METABOLIC PANEL - Abnormal; Notable for the following components:      Result Value   Sodium 134 (*)    Glucose, Bld 119 (*)    BUN 53 (*)    Creatinine, Ser 1.74 (*)    Calcium 7.1 (*)    GFR, Estimated 30 (*)    All other components within normal limits  CBC - Abnormal; Notable for the following components:   RBC 1.01 (*)    Hemoglobin 3.3 (*)    HCT 10.9 (*)    MCV 107.9 (*)    RDW 20.4 (*)    Platelets 27 (*)    nRBC 2.5 (*)    All other components within normal limits  URINALYSIS, ROUTINE W REFLEX MICROSCOPIC  PROTIME-INR  APTT  PATHOLOGIST SMEAR REVIEW  LACTATE DEHYDROGENASE   RETICULOCYTES  COMP PANEL: LEUKEMIA/LYMPHOMA  HAPTOGLOBIN  FOLATE  VITAMIN B12  FERRITIN  IRON AND TIBC  CBC WITH DIFFERENTIAL/PLATELET  TYPE AND SCREEN  PREPARE RBC (CROSSMATCH)  PREPARE PLATELET PHERESIS     EKG  Is reviewed inter by me at 2320 Heart rate 80 QRS 100 QTc 490 Normal sinus rhythm, mild prolongation of QT interval.  No evidence of acute ischemia   RADIOLOGY    Personally viewed and interpreted the patient's chest x-ray, concern for a right pleural effusion and also possible interstitial disease noted in the lung fields.   CT chest abdomen pelvis is pending at the time of transfer of care to Dr. Alfred Levins we will follow-up on this result   PROCEDURES:  Critical Care performed: Yes, see critical care procedure note(s)  Procedures CRITICAL CARE Performed by: Delman Kitten   Total critical care time: 45 minutes  Critical care time was exclusive of separately billable procedures and treating other patients.  Critical care was necessary to treat or prevent imminent or life-threatening deterioration.  Critical care was time spent personally by me on the following activities: development of treatment plan with patient and/or surrogate as well as nursing, discussions with consultants, evaluation of patient's response to treatment, examination of patient, obtaining history from patient or surrogate, ordering and performing treatments and interventions, ordering and review of laboratory studies, ordering and review of radiographic studies, pulse oximetry and re-evaluation of patient's condition.   MEDICATIONS ORDERED IN ED: Medications  0.9 %  sodium chloride infusion (0 mL/hr Intravenous Stopped 07/21/21 2330)  0.9 %  sodium chloride infusion (10 mL/hr Intravenous New Bag/Given 07/21/21 2356)     IMPRESSION / MDM / ASSESSMENT AND PLAN / ED COURSE  I reviewed the triage vital signs and the nursing notes.                              Differential  diagnosis includes, but is not limited to, acute blood loss, underlying acute hematologic or oncologic process such as leukemia, other causes of reduced blood count, TTP etc.  On rectal exam the patient  does not have blood in her stool.  Brown formed testing negative for blood  Patient's platelet count is notably reduced along with her white cell count mildly reduced and also significant anemia.  She has reduction in multiple blood lines.  Her case and care was discussed with Dr. Janese Banks who also called the blood bank and discussed smear results.  The patient is on the cardiac monitor to evaluate for evidence of arrhythmia and/or significant heart rate changes.   Personally reviewed and interpreted the patient's imaging of the chest.  Also reviewed her laboratory results and finding  Clinical Course as of 07/22/21 0005  Thu Jul 21, 2021  2331 Case discussed with Dr. Janese Banks patient's oncologist.  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  She is reviewed the patient's clinical history and we have discussed lab results, and Dr. Janese Banks advises at present presentation and labs are not consistent with TTP.  Recommends and agrees with current plan for blood and platelet transfusion recommends admission to the hospitalist service oncology will see and evaluate the patient tomorrow.  Understands peripheral smear currently in process.  [MQ]  2333 I spoke with the patient's daughter [name], and also with the patient.  Both daughter and patient affirmed that she is previous cancer patient and she has a DO NOT RESUSCITATE.  The patient herself is alert and well-oriented and advises she would not wish to receive CPR or intubation.  She understands the severity of her condition,  [MQ]  2333 critical nature of her disease.  If her condition were to decompensate to a critical level she would not  wish for intubation or CPR [MQ]  2347 Lab called, advised schistocytes are present on the peripheral smear.  I have notified Dr. Janese Banks and she will be calling the blood bank to discus further laboratory findings and smear [MQ]    Clinical Course User Index [MQ] Delman Kitten, MD   ----------------------------------------- 12:03 AM on 07/22/2021 ----------------------------------------- Ongoing care and disposition assigned to Dr. Alfred Levins.  Dr. Janese Banks actively following and has discussed results with blood bank.  We are currently awaiting results of additional testing including coags and LDH.  Dr. Janese Banks advises the presentation seems most likely consistent with picture of perhaps new or acute leukemia, but cannot yet rule out TTP.  If concern for TTP based on additional testing including pending LDH then the patient would require transfer to a center capable of performing a pheresis, but at this point await further testing including coags and LDH to determine if transfer will be required.  Dr. Janese Banks will continue to follow her labs as a result and advise [place] on appropriate disposition  Awaiting results of CT chest abdomen and pelvis.  Vitals:   07/21/21 2355 07/22/21 0001  BP: (!) 89/40 (!) 82/39  Pulse: 82 81  Resp: (!) 21 20  Temp:  98 F (36.7 C)  SpO2: 100% 100%    Patient continues to demonstrate hypotension, but is a fully alert and oriented.  Receiving blood transfusion additional blood at this time and platelets are pending.  Patient alert well oriented seems to be very tolerant of the hypotension at this point, and I suspect that her hypotension is related to acute anemia.  She will be receiving blood and platelets, anticipate that as we restore volume we should begin to see improvement in her blood pressures.  The  patient is clearly clinically ill, will require hospitalization to this point still waiting final recommendations from oncology Dr. Janese Banks regarding admission here at [facility]  for further work-up versus need for transfer. Dr. Alfred Levins to follow-up on pending labs and disposition   FINAL CLINICAL IMPRESSION(S) / ED DIAGNOSES   Final diagnoses:  Acute anemia  Thrombocytopenia ([place])     Rx / DC Orders   ED Discharge Orders     None        Note:  This document was prepared using Dragon voice recognition software and may include unintentional dictation errors.   Delman Kitten, MD 07/22/21 [phone]

## 2021-07-22 ENCOUNTER — Other Ambulatory Visit: Payer: Medicare HMO

## 2021-07-22 ENCOUNTER — Inpatient Hospital Stay: Payer: Medicare HMO

## 2021-07-22 ENCOUNTER — Encounter: Payer: Self-pay | Admitting: Oncology

## 2021-07-22 DIAGNOSIS — I429 Cardiomyopathy, unspecified: Secondary | ICD-10-CM | POA: Diagnosis present

## 2021-07-22 DIAGNOSIS — Z853 Personal history of malignant neoplasm of breast: Secondary | ICD-10-CM | POA: Diagnosis not present

## 2021-07-22 DIAGNOSIS — C9500 Acute leukemia of unspecified cell type not having achieved remission: Secondary | ICD-10-CM | POA: Diagnosis present

## 2021-07-22 DIAGNOSIS — I10 Essential (primary) hypertension: Secondary | ICD-10-CM | POA: Diagnosis present

## 2021-07-22 DIAGNOSIS — I959 Hypotension, unspecified: Secondary | ICD-10-CM | POA: Diagnosis present

## 2021-07-22 DIAGNOSIS — Z66 Do not resuscitate: Secondary | ICD-10-CM | POA: Diagnosis present

## 2021-07-22 DIAGNOSIS — Z20822 Contact with and (suspected) exposure to covid-19: Secondary | ICD-10-CM | POA: Diagnosis present

## 2021-07-22 DIAGNOSIS — I714 Abdominal aortic aneurysm, without rupture, unspecified: Secondary | ICD-10-CM | POA: Diagnosis present

## 2021-07-22 DIAGNOSIS — J449 Chronic obstructive pulmonary disease, unspecified: Secondary | ICD-10-CM | POA: Diagnosis present

## 2021-07-22 DIAGNOSIS — Z800 Family history of malignant neoplasm of digestive organs: Secondary | ICD-10-CM | POA: Diagnosis not present

## 2021-07-22 DIAGNOSIS — I5042 Chronic combined systolic (congestive) and diastolic (congestive) heart failure: Secondary | ICD-10-CM | POA: Diagnosis present

## 2021-07-22 DIAGNOSIS — C3411 Malignant neoplasm of upper lobe, right bronchus or lung: Secondary | ICD-10-CM

## 2021-07-22 DIAGNOSIS — E871 Hypo-osmolality and hyponatremia: Secondary | ICD-10-CM | POA: Diagnosis present

## 2021-07-22 DIAGNOSIS — D649 Anemia, unspecified: Secondary | ICD-10-CM | POA: Diagnosis not present

## 2021-07-22 DIAGNOSIS — D61818 Other pancytopenia: Secondary | ICD-10-CM | POA: Diagnosis present

## 2021-07-22 DIAGNOSIS — D696 Thrombocytopenia, unspecified: Secondary | ICD-10-CM | POA: Diagnosis not present

## 2021-07-22 DIAGNOSIS — Z9981 Dependence on supplemental oxygen: Secondary | ICD-10-CM | POA: Diagnosis not present

## 2021-07-22 DIAGNOSIS — Z515 Encounter for palliative care: Secondary | ICD-10-CM | POA: Insufficient documentation

## 2021-07-22 DIAGNOSIS — D519 Vitamin B12 deficiency anemia, unspecified: Secondary | ICD-10-CM | POA: Diagnosis present

## 2021-07-22 DIAGNOSIS — E7800 Pure hypercholesterolemia, unspecified: Secondary | ICD-10-CM | POA: Diagnosis present

## 2021-07-22 DIAGNOSIS — J9611 Chronic respiratory failure with hypoxia: Secondary | ICD-10-CM | POA: Diagnosis present

## 2021-07-22 DIAGNOSIS — D638 Anemia in other chronic diseases classified elsewhere: Secondary | ICD-10-CM | POA: Diagnosis present

## 2021-07-22 DIAGNOSIS — Z9012 Acquired absence of left breast and nipple: Secondary | ICD-10-CM | POA: Diagnosis not present

## 2021-07-22 DIAGNOSIS — E785 Hyperlipidemia, unspecified: Secondary | ICD-10-CM

## 2021-07-22 DIAGNOSIS — F32A Depression, unspecified: Secondary | ICD-10-CM | POA: Diagnosis present

## 2021-07-22 DIAGNOSIS — N179 Acute kidney failure, unspecified: Secondary | ICD-10-CM | POA: Diagnosis present

## 2021-07-22 DIAGNOSIS — I110 Hypertensive heart disease with heart failure: Secondary | ICD-10-CM | POA: Diagnosis present

## 2021-07-22 DIAGNOSIS — Z9221 Personal history of antineoplastic chemotherapy: Secondary | ICD-10-CM | POA: Diagnosis not present

## 2021-07-22 DIAGNOSIS — Z87891 Personal history of nicotine dependence: Secondary | ICD-10-CM | POA: Diagnosis not present

## 2021-07-22 DIAGNOSIS — Z8249 Family history of ischemic heart disease and other diseases of the circulatory system: Secondary | ICD-10-CM | POA: Diagnosis not present

## 2021-07-22 LAB — BRAIN NATRIURETIC PEPTIDE: B Natriuretic Peptide: 248.7 pg/mL — ABNORMAL HIGH (ref 0.0–100.0)

## 2021-07-22 LAB — CBC WITH DIFFERENTIAL/PLATELET
Abs Immature Granulocytes: 0.4 10*3/uL — ABNORMAL HIGH (ref 0.00–0.07)
Abs Immature Granulocytes: 1.04 10*3/uL — ABNORMAL HIGH (ref 0.00–0.07)
Basophils Absolute: 0 10*3/uL (ref 0.0–0.1)
Basophils Absolute: 0.1 10*3/uL (ref 0.0–0.1)
Basophils Relative: 1 %
Basophils Relative: 1 %
Eosinophils Absolute: 0 10*3/uL (ref 0.0–0.5)
Eosinophils Absolute: 0 10*3/uL (ref 0.0–0.5)
Eosinophils Relative: 1 %
Eosinophils Relative: 1 %
HCT: 10.9 % — CL (ref 36.0–46.0)
HCT: 26.7 % — ABNORMAL LOW (ref 36.0–46.0)
Hemoglobin: 3.4 g/dL — CL (ref 12.0–15.0)
Hemoglobin: 8.9 g/dL — ABNORMAL LOW (ref 12.0–15.0)
Immature Granulocytes: 17 %
Lymphocytes Relative: 54 %
Lymphocytes Relative: 14 %
Lymphs Abs: 2.6 10*3/uL (ref 0.7–4.0)
Lymphs Abs: 0.9 10*3/uL (ref 0.7–4.0)
MCH: 33 pg (ref 26.0–34.0)
MCH: 31.2 pg (ref 26.0–34.0)
MCHC: 31.2 g/dL (ref 30.0–36.0)
MCHC: 33.3 g/dL (ref 30.0–36.0)
MCV: 105.8 fL — ABNORMAL HIGH (ref 80.0–100.0)
MCV: 93.7 fL (ref 80.0–100.0)
Metamyelocytes Relative: 3 %
Monocytes Absolute: 0.2 10*3/uL (ref 0.1–1.0)
Monocytes Absolute: 1.2 10*3/uL — ABNORMAL HIGH (ref 0.1–1.0)
Monocytes Relative: 5 %
Monocytes Relative: 19 %
Myelocytes: 5 %
Neutro Abs: 1.5 10*3/uL — ABNORMAL LOW (ref 1.7–7.7)
Neutro Abs: 3 10*3/uL (ref 1.7–7.7)
Neutrophils Relative %: 31 %
Neutrophils Relative %: 48 %
Platelets: 24 10*3/uL — CL (ref 150–400)
Platelets: 24 10*3/uL — CL (ref 150–400)
RBC: 1.03 MIL/uL — ABNORMAL LOW (ref 3.87–5.11)
RBC: 2.85 MIL/uL — ABNORMAL LOW (ref 3.87–5.11)
RDW: 20.8 % — ABNORMAL HIGH (ref 11.5–15.5)
RDW: 18.5 % — ABNORMAL HIGH (ref 11.5–15.5)
Smear Review: DECREASED
WBC: 4.9 10*3/uL (ref 4.0–10.5)
WBC: 6.2 10*3/uL (ref 4.0–10.5)
nRBC: 2 /100 WBC — ABNORMAL HIGH
nRBC: 2.7 % — ABNORMAL HIGH (ref 0.0–0.2)
nRBC: 2.7 % — ABNORMAL HIGH (ref 0.0–0.2)

## 2021-07-22 LAB — MAGNESIUM: Magnesium: 2.2 mg/dL (ref 1.7–2.4)

## 2021-07-22 LAB — RESP PANEL BY RT-PCR (FLU A&B, COVID) ARPGX2
Influenza A by PCR: NEGATIVE
Influenza B by PCR: NEGATIVE
SARS Coronavirus 2 by RT PCR: NEGATIVE

## 2021-07-22 LAB — PROTIME-INR
INR: 1.3 — ABNORMAL HIGH (ref 0.8–1.2)
Prothrombin Time: 16 seconds — ABNORMAL HIGH (ref 11.4–15.2)

## 2021-07-22 LAB — HEPATIC FUNCTION PANEL
ALT: 14 U/L (ref 0–44)
AST: 27 U/L (ref 15–41)
Albumin: 2.3 g/dL — ABNORMAL LOW (ref 3.5–5.0)
Alkaline Phosphatase: 120 U/L (ref 38–126)
Bilirubin, Direct: 0.4 mg/dL — ABNORMAL HIGH (ref 0.0–0.2)
Indirect Bilirubin: 1.4 mg/dL — ABNORMAL HIGH (ref 0.3–0.9)
Total Bilirubin: 1.8 mg/dL — ABNORMAL HIGH (ref 0.3–1.2)
Total Protein: 4.8 g/dL — ABNORMAL LOW (ref 6.5–8.1)

## 2021-07-22 LAB — URINALYSIS, ROUTINE W REFLEX MICROSCOPIC
Bilirubin Urine: NEGATIVE
Glucose, UA: NEGATIVE mg/dL
Hgb urine dipstick: NEGATIVE
Ketones, ur: NEGATIVE mg/dL
Nitrite: POSITIVE — AB
Protein, ur: NEGATIVE mg/dL
Specific Gravity, Urine: 1.02 (ref 1.005–1.030)
WBC, UA: >50 WBC/hpf — ABNORMAL HIGH (ref 0–5)
pH: 5 (ref 5.0–8.0)

## 2021-07-22 LAB — CBC
HCT: 26.3 % — ABNORMAL LOW (ref 36.0–46.0)
Hemoglobin: 8.8 g/dL — ABNORMAL LOW (ref 12.0–15.0)
MCH: 32 pg (ref 26.0–34.0)
MCHC: 33.5 g/dL (ref 30.0–36.0)
MCV: 95.6 fL (ref 80.0–100.0)
Platelets: 21 10*3/uL — CL (ref 150–400)
RBC: 2.75 MIL/uL — ABNORMAL LOW (ref 3.87–5.11)
RDW: 19.1 % — ABNORMAL HIGH (ref 11.5–15.5)
WBC: 5.3 10*3/uL (ref 4.0–10.5)
nRBC: 2.3 % — ABNORMAL HIGH (ref 0.0–0.2)

## 2021-07-22 LAB — HEMOGLOBIN AND HEMATOCRIT, BLOOD
HCT: 26.1 % — ABNORMAL LOW (ref 36.0–46.0)
Hemoglobin: 8.8 g/dL — ABNORMAL LOW (ref 12.0–15.0)

## 2021-07-22 LAB — COMPREHENSIVE METABOLIC PANEL
ALT: 14 U/L (ref 0–44)
AST: 20 U/L (ref 15–41)
Albumin: 2.4 g/dL — ABNORMAL LOW (ref 3.5–5.0)
Alkaline Phosphatase: 110 U/L (ref 38–126)
Anion gap: 8 (ref 5–15)
BUN: 48 mg/dL — ABNORMAL HIGH (ref 8–23)
CO2: 22 mmol/L (ref 22–32)
Calcium: 7.4 mg/dL — ABNORMAL LOW (ref 8.9–10.3)
Chloride: 102 mmol/L (ref 98–111)
Creatinine, Ser: 1.42 mg/dL — ABNORMAL HIGH (ref 0.44–1.00)
GFR, Estimated: 39 mL/min — ABNORMAL LOW (ref >60)
Glucose, Bld: 110 mg/dL — ABNORMAL HIGH (ref 70–99)
Potassium: 4.1 mmol/L (ref 3.5–5.1)
Sodium: 132 mmol/L — ABNORMAL LOW (ref 135–145)
Total Bilirubin: 1.3 mg/dL — ABNORMAL HIGH (ref 0.3–1.2)
Total Protein: 5.1 g/dL — ABNORMAL LOW (ref 6.5–8.1)

## 2021-07-22 LAB — FERRITIN: Ferritin: 53 ng/mL (ref 11–307)

## 2021-07-22 LAB — VITAMIN B12: Vitamin B-12: 3776 pg/mL — ABNORMAL HIGH (ref 180–914)

## 2021-07-22 LAB — FOLATE: Folate: 9.3 ng/mL (ref >5.9)

## 2021-07-22 LAB — RETICULOCYTES
Immature Retic Fract: 11.4 % (ref 2.3–15.9)
RBC.: 1.85 MIL/uL — ABNORMAL LOW (ref 3.87–5.11)
Retic Count, Absolute: 40.1 10*3/uL (ref 19.0–186.0)
Retic Ct Pct: 2.2 % (ref 0.4–3.1)

## 2021-07-22 LAB — IRON AND TIBC
Iron: 182 ug/dL — ABNORMAL HIGH (ref 28–170)
Saturation Ratios: 93 % — ABNORMAL HIGH (ref 10.4–31.8)
TIBC: 196 ug/dL — ABNORMAL LOW (ref 250–450)
UIBC: 14 ug/dL

## 2021-07-22 LAB — LACTATE DEHYDROGENASE: LDH: 343 U/L — ABNORMAL HIGH (ref 98–192)

## 2021-07-22 LAB — PREPARE RBC (CROSSMATCH)

## 2021-07-22 LAB — APTT: aPTT: 29 seconds (ref 24–36)

## 2021-07-22 LAB — PATHOLOGIST SMEAR REVIEW

## 2021-07-22 LAB — IMMATURE PLATELET FRACTION: Immature Platelet Fraction: 25.8 % — ABNORMAL HIGH (ref 1.2–8.6)

## 2021-07-22 MED ORDER — FENTANYL CITRATE (PF) 100 MCG/2ML IJ SOLN
INTRAMUSCULAR | Status: AC | PRN
  Administered 2021-07-22: 25 ug via INTRAVENOUS

## 2021-07-22 MED ORDER — PANTOPRAZOLE SODIUM 40 MG PO TBEC
40.0000 mg | DELAYED_RELEASE_TABLET | Freq: Every day | ORAL | Status: DC
  Administered 2021-07-22 – 2021-07-23 (×2): 40 mg via ORAL
  Filled 2021-07-22 (×2): qty 1

## 2021-07-22 MED ORDER — MELATONIN 5 MG PO TABS
10.0000 mg | ORAL_TABLET | Freq: Every day | ORAL | Status: DC
  Administered 2021-07-22: 10 mg via ORAL
  Filled 2021-07-22: qty 2

## 2021-07-22 MED ORDER — MIDODRINE HCL 5 MG PO TABS
10.0000 mg | ORAL_TABLET | Freq: Three times a day (TID) | ORAL | Status: DC
Start: 2021-07-22 — End: 2021-07-23
  Administered 2021-07-22 – 2021-07-23 (×3): 10 mg via ORAL
  Filled 2021-07-22 (×3): qty 2

## 2021-07-22 MED ORDER — DM-GUAIFENESIN ER 30-600 MG PO TB12: 1.0000 | ORAL_TABLET | Freq: Two times a day (BID) | ORAL | Status: DC | PRN

## 2021-07-22 MED ORDER — SODIUM CHLORIDE 0.9% IV SOLUTION: Freq: Once | INTRAVENOUS | Status: AC

## 2021-07-22 MED ORDER — HEPARIN SOD (PORK) LOCK FLUSH 100 UNIT/ML IV SOLN
INTRAVENOUS | Status: AC
  Filled 2021-07-22: qty 5

## 2021-07-22 MED ORDER — CYANOCOBALAMIN 500 MCG PO TABS
500.0000 ug | ORAL_TABLET | Freq: Every day | ORAL | Status: DC
  Administered 2021-07-22 – 2021-07-23 (×2): 500 ug via ORAL
  Filled 2021-07-22 (×2): qty 1

## 2021-07-22 MED ORDER — ACETAMINOPHEN 325 MG PO TABS
650.0000 mg | ORAL_TABLET | Freq: Four times a day (QID) | ORAL | Status: DC | PRN
  Administered 2021-07-22 – 2021-07-23 (×2): 650 mg via ORAL
  Filled 2021-07-22 (×2): qty 2

## 2021-07-22 MED ORDER — ATORVASTATIN CALCIUM 20 MG PO TABS
10.0000 mg | ORAL_TABLET | Freq: Every day | ORAL | Status: DC
  Administered 2021-07-22: 10 mg via ORAL
  Filled 2021-07-22: qty 1

## 2021-07-22 MED ORDER — LOPERAMIDE HCL 2 MG PO CAPS: 2.0000 mg | ORAL_CAPSULE | ORAL | Status: DC | PRN

## 2021-07-22 MED ORDER — MAGNESIUM HYDROXIDE 400 MG/5ML PO SUSP
30.0000 mL | Freq: Every day | ORAL | Status: DC | PRN
  Administered 2021-07-23: 30 mL via ORAL
  Filled 2021-07-22: qty 30

## 2021-07-22 MED ORDER — MIDODRINE HCL 5 MG PO TABS
5.0000 mg | ORAL_TABLET | Freq: Two times a day (BID) | ORAL | Status: DC
  Administered 2021-07-22: 5 mg via ORAL
  Filled 2021-07-22: qty 1

## 2021-07-22 MED ORDER — FENTANYL CITRATE (PF) 100 MCG/2ML IJ SOLN
INTRAMUSCULAR | Status: AC
  Filled 2021-07-22: qty 2

## 2021-07-22 MED ORDER — ALBUTEROL SULFATE (2.5 MG/3ML) 0.083% IN NEBU: 3.0000 mL | INHALATION_SOLUTION | RESPIRATORY_TRACT | Status: DC | PRN

## 2021-07-22 MED ORDER — FOLIC ACID 1 MG PO TABS
0.5000 mg | ORAL_TABLET | Freq: Every day | ORAL | Status: DC
  Administered 2021-07-22 – 2021-07-23 (×2): 0.5 mg via ORAL
  Filled 2021-07-22 (×2): qty 1

## 2021-07-22 MED ORDER — ACETAMINOPHEN 650 MG RE SUPP: 650.0000 mg | Freq: Four times a day (QID) | RECTAL | Status: DC | PRN

## 2021-07-22 MED ORDER — SENNOSIDES-DOCUSATE SODIUM 8.6-50 MG PO TABS
1.0000 | ORAL_TABLET | Freq: Every day | ORAL | Status: DC
  Administered 2021-07-22: 1 via ORAL
  Filled 2021-07-22: qty 1

## 2021-07-22 MED ORDER — TRAZODONE HCL 50 MG PO TABS
50.0000 mg | ORAL_TABLET | Freq: Every day | ORAL | Status: DC
Start: 2021-07-22 — End: 2021-07-23
  Administered 2021-07-22: 50 mg via ORAL
  Filled 2021-07-22: qty 1

## 2021-07-22 MED ORDER — IPRATROPIUM BROMIDE 0.02 % IN SOLN
3.0000 mL | Freq: Four times a day (QID) | RESPIRATORY_TRACT | Status: DC
  Administered 2021-07-22 – 2021-07-23 (×5): 0.6 mg via RESPIRATORY_TRACT
  Filled 2021-07-22 (×4): qty 5

## 2021-07-22 MED ORDER — MOMETASONE FURO-FORMOTEROL FUM 200-5 MCG/ACT IN AERO
2.0000 | INHALATION_SPRAY | Freq: Two times a day (BID) | RESPIRATORY_TRACT | Status: DC
  Filled 2021-07-22: qty 8.8

## 2021-07-22 MED ORDER — MIDAZOLAM HCL 2 MG/2ML IJ SOLN
INTRAMUSCULAR | Status: AC
  Filled 2021-07-22: qty 2

## 2021-07-22 MED ORDER — ROPINIROLE HCL 0.25 MG PO TABS
0.5000 mg | ORAL_TABLET | Freq: Every day | ORAL | Status: DC
Start: 2021-07-22 — End: 2021-07-23
  Administered 2021-07-22: 0.5 mg via ORAL
  Filled 2021-07-22 (×2): qty 2

## 2021-07-22 MED ORDER — ONDANSETRON HCL 4 MG/2ML IJ SOLN: 4.0000 mg | Freq: Three times a day (TID) | INTRAMUSCULAR | Status: DC | PRN

## 2021-07-22 NOTE — Consult Note (Signed)
[place] [facility] [phone] [phone]   Name: Roberta [name] Date: 07/22/2021 MRN: [id number]  DOB: 02-18-1946  Patient Care Team: Gayland Curry, MD as PCP - General (Family Medicine) Rockey Situ, Kathlene November, MD as PCP - Cardiology (Cardiology) Gayland Curry, MD (Family Medicine) Telford Nab, RN as Registered Nurse Noreene Filbert, MD as Referring Physician (Radiation Oncology) Lequita Asal, MD (Inactive) as Consulting Physician (Hematology and Oncology) Hessie Knows, MD as Consulting Physician (Orthopedic Surgery)    REASON FOR CONSULTATION: Roberta [name] is a 76 y.o. female with multiple medical problems including cardiomyopathy, moderate valvular heart disease, AAA status postrepair, COPD on 2 L O2, and history of breast cancer and small cell lung cancer currently in remission.  Patient presented to the ED on 07/21/2021 with worsening fatigue and shortness of breath and was found to have severe symptomatic anemia requiring transfusions.  She is status post bone marrow biopsy.  Palliative care was consulted up address goals.  SOCIAL HISTORY:     reports that she has quit smoking. Her smoking use included cigarettes. She has a 75.00 pack-year smoking history. She has never used smokeless tobacco. She reports that she does not drink alcohol and does not use drugs.  Patient is widowed.  She was previously living at home with a daughter but has lived at [facility] since October 2022.  She has a daughter in [place] and another daughter and son who lives in [place].  ADVANCE DIRECTIVES:  Not on file  CODE STATUS: DNR  PAST MEDICAL HISTORY: Past Medical History:  Diagnosis Date   AAA (abdominal aortic aneurysm)    Anemia    Breast cancer ([place]) 2002   left   Cardiomyopathy ([place])    a. 09/2020 Echo: EF 25-30%, glob HK, Gr2 DD, mod reduced RV fxn. Mod dil LA. Mild to mod MR. Mod AS.    Chronic combined systolic and diastolic CHF (congestive heart failure) ([place])    a. 09/2020 Echo: EF 25-30%, glob HK, gr2 DD.   COPD (chronic obstructive pulmonary disease) (HCC)    Coronary artery calcification seen on CT scan    Dyspnea    Headache    High cholesterol    History of stress test    a. 10/2016 Low risk MV. EF 60%.   Mitral regurgitation    Moderate aortic stenosis    Personal history of chemotherapy    Small cell lung cancer ([place])    Tobacco abuse     PAST SURGICAL HISTORY:  Past Surgical History:  Procedure Laterality Date   ABDOMINAL HYSTERECTOMY     ARTERY BIOPSY Right 12/05/2017   Procedure: BIOPSY TEMPORAL ARTERY;  Surgeon: Margaretha Sheffield, MD;  Location: [facility] ORS;  Service: ENT;  Laterality: Right;   ENDOBRONCHIAL ULTRASOUND N/A 02/19/2017   Procedure: ENDOBRONCHIAL ULTRASOUND;  Surgeon: Flora Lipps, MD;  Location: [facility] ORS;  Service: Cardiopulmonary;  Laterality: N/A;   ENDOVASCULAR REPAIR/STENT GRAFT N/A 12/06/2016   Procedure: Endovascular Repair/Stent Graft;  Surgeon: Katha Cabal, MD;  Location: [place] CV LAB;  Service: Cardiovascular;  Laterality: N/A;   HIP ARTHROPLASTY Left 10/26/2020   Procedure: ARTHROPLASTY BIPOLAR HIP (HEMIARTHROPLASTY);  Surgeon: Renee Harder, MD;  Location: [facility] ORS;  Service: Orthopedics;  Laterality: Left;   IR FLUORO GUIDE PORT INSERTION RIGHT  03/02/2017   IR REMOVAL TUN ACCESS W/ PORT W/O FL MOD SED  06/14/2021   KYPHOPLASTY N/A 06/11/2018   Procedure: KYPHOPLASTY L4  BIOPSY WITH RFA;  Surgeon: Hessie Knows, MD;  Location: [facility] ORS;  Service: Orthopedics;  Laterality: N/A;   KYPHOPLASTY N/A 12/16/2019   Procedure: T6 KYPHOPLASTY;  Surgeon: Hessie Knows, MD;  Location: [facility] ORS;  Service: Orthopedics;  Laterality: N/A;   MASTECTOMY Left 2002   with sentinel node     HEMATOLOGY/ONCOLOGY HISTORY:  Oncology History   No history exists.    ALLERGIES:  has No Known Allergies.  MEDICATIONS:  Current  Facility-Administered Medications  Medication Dose Route Frequency Provider Last Rate Last Admin   acetaminophen (TYLENOL) tablet 650 mg  650 mg Oral Q6H PRN Howerter, Justin B, DO       Or   acetaminophen (TYLENOL) suppository 650 mg  650 mg Rectal Q6H PRN Howerter, Justin B, DO       albuterol (PROVENTIL) (2.5 MG/3ML) 0.083% nebulizer solution 3 mL  3 mL Inhalation Q4H PRN Ivor Costa, MD       atorvastatin (LIPITOR) tablet 10 mg  10 mg Oral QHS Ivor Costa, MD       dextromethorphan-guaiFENesin ([place] DM) 30-600 MG per 12 hr tablet 1 tablet  1 tablet Oral BID PRN Ivor Costa, MD       fentaNYL (SUBLIMAZE) 100 MCG/2ML injection            folic acid (FOLVITE) tablet 0.5 mg  0.5 mg Oral Daily Ivor Costa, MD   0.5 mg at 07/22/21 1042   heparin lock flush 100 UNIT/ML injection            ipratropium (ATROVENT) nebulizer solution 0.6 mg  3 mL Nebulization Q6H Ivor Costa, MD   0.6 mg at 07/22/21 1044   loperamide (IMODIUM) capsule 2 mg  2 mg Oral PRN Ivor Costa, MD       magnesium hydroxide (MILK OF MAGNESIA) suspension 30 mL  30 mL Oral Daily PRN Ivor Costa, MD       melatonin tablet 10 mg  10 mg Oral QHS Ivor Costa, MD       midodrine (PROAMATINE) tablet 5 mg  5 mg Oral BID WC Ivor Costa, MD   5 mg at 07/22/21 1043   [START ON 07/23/2021] mometasone-formoterol (DULERA) 200-5 MCG/ACT inhaler 2 puff  2 puff Inhalation BID Ivor Costa, MD       ondansetron [facility]) injection 4 mg  4 mg Intravenous Q8H PRN Ivor Costa, MD       pantoprazole (PROTONIX) EC tablet 40 mg  40 mg Oral Daily Ivor Costa, MD   40 mg at 07/22/21 1042   rOPINIRole (REQUIP) tablet 0.5 mg  0.5 mg Oral QHS Ivor Costa, MD       senna-docusate (Senokot-S) tablet 1 tablet  1 tablet Oral QHS Ivor Costa, MD       traZODone (DESYREL) tablet 50 mg  50 mg Oral QHS Ivor Costa, MD       vitamin B-12 (CYANOCOBALAMIN) tablet 500 mcg  500 mcg Oral Daily Ivor Costa, MD   500 mcg at 07/22/21 1107   Current Outpatient Medications  Medication Sig  Dispense Refill   acetaminophen (TYLENOL) 500 MG tablet Take 1,000 mg by mouth every 8 (eight) hours as needed for mild pain.     aspirin EC 81 MG EC tablet Take 1 tablet (81 mg total) by mouth daily. Swallow whole. 30 tablet 11   atorvastatin (LIPITOR) 10 MG tablet Take 10 mg by mouth at bedtime.      Cobalamin Combinations (VITAMIN B12-FOLIC ACID) 235-361 MCG TABS Take  1 tablet by mouth daily.     fluticasone-salmeterol (ADVAIR HFA) 115-21 MCG/ACT inhaler Inhale 2 puffs into the lungs 2 (two) times daily.     ipratropium (ATROVENT) 0.02 % nebulizer solution Take 3 mLs by nebulization in the morning, at noon, in the evening, and at bedtime.     melatonin 3 MG TABS tablet Take 9 mg by mouth at bedtime.     midodrine (PROAMATINE) 5 MG tablet Take 5 mg by mouth 2 (two) times daily with a meal.     omeprazole (PRILOSEC) 20 MG capsule Take 1 capsule (20 mg total) by mouth daily. 30 capsule 1   rOPINIRole (REQUIP) 0.5 MG tablet Take 0.5 mg by mouth at bedtime.     senna-docusate (SENOKOT-S) 8.6-50 MG tablet Take 1 tablet by mouth at bedtime.     traZODone (DESYREL) 50 MG tablet Take 1 tablet (50 mg total) by mouth at bedtime.     albuterol (PROVENTIL HFA;VENTOLIN HFA) 108 (90 Base) MCG/ACT inhaler Inhale 2 puffs into the lungs every 6 (six) hours as needed for wheezing or shortness of breath. 1 Inhaler 1   furosemide (LASIX) 20 MG tablet Take 1 tablet (20 mg total) by mouth every Monday, Wednesday, and Friday. 30 tablet 0   loperamide (IMODIUM) 2 MG capsule Take 1 capsule (2 mg total) by mouth as needed for diarrhea or loose stools. 30 capsule 0   losartan (COZAAR) 25 MG tablet Take 1 tablet (25 mg total) by mouth daily. 90 tablet 3   magnesium hydroxide (MILK OF MAGNESIA) 400 MG/5ML suspension Take 30 mLs by mouth daily as needed for mild constipation. 355 mL 0   melatonin 5 MG TABS Take 0.5 tablets (2.5 mg total) by mouth at bedtime. (Patient not taking: Reported on 07/22/2021)  0   metoprolol  succinate (TOPROL XL) 25 MG 24 hr tablet Take 0.5 tablets (12.5 mg total) by mouth daily. 45 tablet 3   nicotine (NICODERM CQ - DOSED IN MG/24 HOURS) 14 mg/24hr patch Place 1 patch (14 mg total) onto the skin daily. (Patient not taking: Reported on 05/31/2021) 28 patch 0   vitamin B-12 (CYANOCOBALAMIN) 1000 MCG tablet Take 1 tablet (1,000 mcg total) by mouth daily. (Patient not taking: Reported on 07/22/2021) 30 tablet 3   Facility-Administered Medications Ordered in Other Encounters  Medication Dose Route Frequency Provider Last Rate Last Admin   sodium chloride flush (NS) 0.9 % injection 10 mL  10 mL Intravenous PRN Sindy Guadeloupe, MD   10 mL at 03/26/17 0948   sodium chloride flush (NS) 0.9 % injection 10 mL  10 mL Intravenous PRN Sindy Guadeloupe, MD   10 mL at 04/16/17 1006   sodium chloride flush (NS) 0.9 % injection 10 mL  10 mL Intravenous PRN Sindy Guadeloupe, MD   10 mL at 01/10/18 1410   sodium chloride flush (NS) 0.9 % injection 10 mL  10 mL Intravenous PRN Nolon Stalls C, MD   10 mL at 12/11/18 1033    VITAL SIGNS: BP (!) 104/56    Pulse 84    Temp 98.1 F (36.7 C) (Oral)    Resp 20    Ht 5' 7" (1.702 m)    Wt 143 lb 4.8 oz (65 kg)    SpO2 100%    BMI 22.44 kg/m  Filed Weights   07/21/21 2215  Weight: 143 lb 4.8 oz (65 kg)    Estimated body mass index is 22.44 kg/m as calculated from the  following:   Height as of this encounter: 5' 7" (1.702 m).   Weight as of this encounter: 143 lb 4.8 oz (65 kg).  LABS: CBC:    Component Value Date/Time   WBC 6.2 07/22/2021 0327   HGB 8.9 (L) 07/22/2021 0327   HCT 26.7 (L) 07/22/2021 0327   PLT 24 (LL) 07/22/2021 0327   MCV 93.7 07/22/2021 0327   NEUTROABS 3.0 07/22/2021 0327   LYMPHSABS 0.9 07/22/2021 0327   MONOABS 1.2 (H) 07/22/2021 0327   EOSABS 0.0 07/22/2021 0327   BASOSABS 0.1 07/22/2021 0327   Comprehensive Metabolic Panel:    Component Value Date/Time   NA 132 (L) 07/22/2021 0327   K 4.1 07/22/2021 0327   CL 102  07/22/2021 0327   CO2 22 07/22/2021 0327   BUN 48 (H) 07/22/2021 0327   CREATININE 1.42 (H) 07/22/2021 0327   GLUCOSE 110 (H) 07/22/2021 0327   CALCIUM 7.4 (L) 07/22/2021 0327   AST 20 07/22/2021 0327   ALT 14 07/22/2021 0327   ALKPHOS 110 07/22/2021 0327   BILITOT 1.3 (H) 07/22/2021 0327   PROT 5.1 (L) 07/22/2021 0327   ALBUMIN 2.4 (L) 07/22/2021 0327    RADIOGRAPHIC STUDIES: DG Chest Portable 1 View  Result Date: 07/21/2021 CLINICAL DATA:  Acute hypotension and anemia. EXAM: PORTABLE CHEST 1 VIEW COMPARISON:  Chest radiograph dated 11/21/2020 and CT dated 12/16/2020. FINDINGS: Interval removal of the previously seen Port-A-Cath. Postradiation changes and scarring in the right upper lobe. Interval development of small right pleural effusion. There is diffuse chronic interstitial coarsening. However, slight increase in interstitial markings compared to prior radiograph which may represent edema. Pneumonia isn't excluded. No pneumothorax. Stable cardiomediastinal silhouette. Atherosclerotic calcification of the aorta. Osteopenia with degenerative changes of the spine and midthoracic vertebroplasty. No acute osseous pathology. IMPRESSION: 1. Interval development of small right pleural effusion. 2. Slight increase in the interstitial markings compared to prior radiograph which may represent edema. Pneumonia is not excluded. Electronically Signed   By: Anner Crete M.D.   On: 07/21/2021 23:20   ECHOCARDIOGRAM COMPLETE  Result Date: 06/27/2021    ECHOCARDIOGRAM REPORT   Patient Name:   Roberta [name] Date of Exam: 06/27/2021 Medical Rec #:  [id number]         Height:       67.0 in Accession #:    [id number]        Weight:       138.0 lb Date of Birth:  Jun 14, 1945         BSA:          1.727 m Patient Age:    75 years          BP:           130/76 mmHg Patient Gender: F                 HR:           84 bpm. Exam Location:  [facility] Procedure: 2D Echo, Color Doppler and Limited Color Doppler  Indications:    I50.20* Unspecified systolic (congestive) heart failure  History:        Patient has prior history of Echocardiogram examinations, most                 recent 10/13/2020. Cardiomyopathy and CHF, AAA, PAD and COPD,                 Aortic Valve Disease; Risk Factors:Dyslipidemia and Former  Smoker.  Sonographer:    Pilar Jarvis RDMS, RVT, RDCS Referring Phys: [place] Comments: Technically challenging study due to limited acoustic windows. IMPRESSIONS  1. Left ventricular ejection fraction, by estimation, is 45 to 50%. Left ventricular ejection fraction by 2D MOD biplane is 49.4 %. The left ventricle has mildly decreased function. The left ventricle demonstrates global hypokinesis. There is mild left ventricular hypertrophy. Left ventricular diastolic parameters are consistent with Grade I diastolic dysfunction (impaired relaxation).  2. Right ventricular systolic function is normal. The right ventricular size is normal.  3. The mitral valve is degenerative. Mild to moderate mitral valve regurgitation. Severe mitral annular calcification.  4. Tricuspid valve regurgitation is mild to moderate.  5. The aortic valve is calcified. Aortic valve regurgitation is not visualized. Mild aortic valve stenosis. Comparison(s): Previous AV pressure gradients with an LVEF of 25-30%: Max 13 mmHg, mean 88mHg. FINDINGS  Left Ventricle: Left ventricular ejection fraction, by estimation, is 45 to 50%. Left ventricular ejection fraction by 2D MOD biplane is 49.4 %. The left ventricle has mildly decreased function. The left ventricle demonstrates global hypokinesis. The left ventricular internal cavity size was normal in size. There is mild left ventricular hypertrophy. Left ventricular diastolic parameters are consistent with Grade I diastolic dysfunction (impaired relaxation). Right Ventricle: The right ventricular size is normal. No increase in right ventricular wall thickness.  Right ventricular systolic function is normal. Left Atrium: Left atrial size was normal in size. Right Atrium: Right atrial size was normal in size. Pericardium: There is no evidence of pericardial effusion. Mitral Valve: The mitral valve is degenerative in appearance. Severe mitral annular calcification. Mild to moderate mitral valve regurgitation. Tricuspid Valve: The tricuspid valve is normal in structure. Tricuspid valve regurgitation is mild to moderate. Aortic Valve: The aortic valve is calcified. Aortic valve regurgitation is not visualized. Mild aortic stenosis is present. Aortic valve mean gradient measures 9.5 mmHg. Aortic valve peak gradient measures 18.6 mmHg. Aortic valve area, by VTI measures 1.53 cm. Pulmonic Valve: The pulmonic valve was normal in structure. Pulmonic valve regurgitation is mild. Aorta: The aortic root and ascending aorta are structurally normal, with no evidence of dilitation. Venous: The inferior vena cava was not well visualized. IAS/Shunts: No atrial level shunt detected by color flow Doppler.  LEFT VENTRICLE PLAX 2D                        Biplane EF (MOD) LVIDd:         4.55 cm         LV Biplane EF:   Left LVIDs:         4.00 cm                          ventricular LV PW:         0.90 cm                          ejection LV IVS:        0.90 cm                          fraction by LVOT diam:     2.00 cm                          2D MOD LV SV:  59                               biplane is LV SV Index:   34                               49.4 %. LVOT Area:     3.14 cm                                Diastology                                LV e' medial:    6.96 cm/s LV Volumes (MOD)               LV E/e' medial:  14.0 LV vol d, MOD    70.0 ml       LV e' lateral:   8.59 cm/s A2C:                           LV E/e' lateral: 11.3 LV vol d, MOD    72.3 ml A4C: LV vol s, MOD    35.7 ml A2C: LV vol s, MOD    36.7 ml A4C: LV SV MOD A2C:   34.3 ml LV SV MOD A4C:   72.3 ml LV SV MOD  BP:    36.5 ml RIGHT VENTRICLE             IVC RV Basal diam:  3.10 cm     IVC diam: 1.80 cm RV Mid diam:    2.40 cm RV S prime:     11.60 cm/s TAPSE (M-mode): 2.4 cm LEFT ATRIUM             Index        RIGHT ATRIUM           Index LA diam:        3.00 cm 1.74 cm/m   RA Area:     12.20 cm LA Vol (A2C):   36.5 ml 21.13 ml/m  RA Volume:   24.70 ml  14.30 ml/m LA Vol (A4C):   28.0 ml 16.21 ml/m LA Biplane Vol: 33.3 ml 19.28 ml/m  AORTIC VALVE                     PULMONIC VALVE AV Area (Vmax):    1.72 cm      PV Vmax:          1.26 m/s AV Area (Vmean):   1.51 cm      PV Peak grad:     6.4 mmHg AV Area (VTI):     1.53 cm      PR End Diast Vel: 4.84 msec AV Vmax:           215.50 cm/s AV Vmean:          142.000 cm/s AV VTI:            0.389 m AV Peak Grad:      18.6 mmHg AV Mean Grad:      9.5 mmHg LVOT Vmax:         118.00 cm/s LVOT Vmean:        68.300  cm/s LVOT VTI:          0.189 m LVOT/AV VTI ratio: 0.49  AORTA Ao Root diam: 3.00 cm Ao Asc diam:  2.40 cm Ao Arch diam: 2.4 cm MITRAL VALVE                TRICUSPID VALVE MV Area (PHT): 4.89 cm     TR Peak grad:   38.9 mmHg MV Decel Time: 155 msec     TR Vmax:        312.00 cm/s MV E velocity: 97.40 cm/s MV A velocity: 132.00 cm/s  SHUNTS MV E/A ratio:  0.74         Systemic VTI:  0.19 m                             Systemic Diam: 2.00 cm Kate Sable MD Electronically signed by Kate Sable MD Signature Date/Time: 06/27/2021/5:39:16 PM    Final    CT CHEST ABDOMEN PELVIS WO CONTRAST  Result Date: 07/22/2021 CLINICAL DATA:  Chronic dyspnea. Anemia and thrombocytopenia. History of small cell lung cancer. EXAM: CT CHEST, ABDOMEN AND PELVIS WITHOUT CONTRAST TECHNIQUE: Multidetector CT imaging of the chest, abdomen and pelvis was performed following the standard protocol without IV contrast. RADIATION DOSE REDUCTION: This exam was performed according to the departmental dose-optimization program which includes automated exposure control, adjustment of  the mA and/or kV according to patient size and/or use of iterative reconstruction technique. COMPARISON:  Chest radiograph dated 07/21/2021 and CT dated 12/16/2020. CT abdomen pelvis dated 11/20/2020. FINDINGS: Evaluation of this exam is limited in the absence of intravenous contrast. CT CHEST FINDINGS Cardiovascular: There is no cardiomegaly or pericardial effusion. Coronary vascular calcification of the LAD. There is calcification of the mitral annulus. There is hypoattenuation of the cardiac blood pool suggestive of anemia. Clinical correlation is recommended. Moderate atherosclerotic calcification of the thoracic aorta. No aneurysmal dilatation. The central pulmonary arteries are grossly unremarkable on this noncontrast CT. Mediastinum/Nodes: No hilar or mediastinal adenopathy. Evaluation however is very limited in the absence of contrast and due to post radiation changes of the right upper lobe. The esophagus is grossly unremarkable. No mediastinal fluid collection. Lungs/Pleura: Interval increase in the right pleural effusion since the prior CT with compressive atelectasis of the majority of the right lower lobe. Small left pleural effusion. Post radiation changes in the right upper lobe and perihilar region with architectural distortion, traction bronchiectasis, and volume loss. There is overall interval increase in the right perihilar soft tissue thickening since the prior CT. Recurrent disease is not excluded. PET-CT may provide better evaluation. Biapical subpleural scarring. No pneumothorax. 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 Musculoskeletal: Degenerative changes of the spine and osteopenia. Mild chronic compression fracture of superior endplate of T5. T6 vertebroplasty. No acute osseous pathology. CT ABDOMEN PELVIS FINDINGS No intra-abdominal free air or free fluid. Hepatobiliary: The liver is unremarkable. No intrahepatic biliary ductal  dilatation. The gallbladder is unremarkable. Pancreas: Unremarkable. No pancreatic ductal dilatation or surrounding inflammatory changes. Spleen: Normal in size without focal abnormality. Adrenals/Urinary Tract: The adrenal glands unremarkable. There is no hydronephrosis or nephrolithiasis on either side. The visualized ureters appear unremarkable. The urinary bladder is suboptimally visualized due to streak artifact caused by left hip arthroplasty. Stomach/Bowel: There is sigmoid diverticulosis with muscular hypertrophy. No definite active inflammatory changes. There is no bowel obstruction or active inflammation. The appendix is  normal. Vascular/Lymphatic: An aorto bi iliac endovascular stent graft repair noted. Evaluation is limited in the absence of contrast. The aorta is tortuous. There is moderate atherosclerotic calcification of the aorta. The IVC is grossly unremarkable. No portal venous gas. There is no adenopathy. Reproductive: The uterus is grossly unremarkable. No adnexal masses. Other: None Musculoskeletal: Osteopenia with scoliosis and degenerative changes of the spine. Old L4 compression fracture with anterior wedging and vertebroplasty as seen on the CT of 11/20/2020. Similar appearance of compression fracture of the inferior endplate of L1. No acute osseous pathology. Left hip arthroplasty. IMPRESSION: 1. Interval increase in the right pleural effusion since the prior CT with compressive atelectasis of the majority of the right lower lobe. Small left pleural effusion. 2. Post radiation changes in the right upper lobe and perihilar region. There is overall interval increase in the right perihilar soft tissue thickening since the prior CT. Recurrent disease is not excluded. PET-CT may provide better evaluation. 3. Sigmoid diverticulosis. No bowel obstruction. Normal appendix. 4. Aortic Atherosclerosis (ICD10-I70.0). Electronically Signed   By: Anner Crete M.D.   On: 07/22/2021 00:04     PERFORMANCE STATUS (ECOG) : 3 - Symptomatic, >50% confined to bed  Review of Systems Unless otherwise noted, a complete review of systems is negative.  Physical Exam General: Frail appearing Cardiovascular: regular rate and rhythm Pulmonary: clear ant fields, on O2 Abdomen: soft, nontender, + bowel sounds GU: no suprapubic tenderness Extremities: no edema, no joint deformities Skin: no rashes Neurological: Weakness but otherwise nonfocal  IMPRESSION: Patient seen in the ED.  She was somewhat groggy but wakes easily with stimulation and attributes her sleepiness to a long night in the emergency department.  Patient states that she is aware that her blood counts are low and this could reflect a possible cancer diagnosis.  Patient says that her priority is to "go home."  She says that she is feeling better and hopes that she will be able to return to the SNF soon as possible.  Patient requested that I call her daughter [name].  I spoke with her daughter by phone.  [name] says that she has been updated by the hospitalist and is aware that work-up is concerning for possible hematological malignancy.  Daughter says that she has talked with her siblings and that she does not think that family nor patient would opt to pursue further cancer treatment.  Patient has previously received treatment with chemo and radiation for breast and lung cancer.  However, daughter reports that patient is significantly more frail now and she thinks that a comfort/quality of life approach would likely be the better option.  I did discuss the option of hospice should patient and family opted to forego cancer treatment.  We agreed that we could discuss goals in more detail once results of bone marrow biopsy are finalized.  Patient is a DNR/DNI.  At baseline, patient lives at [facility].  She is mostly in the wheelchair and dependent upon staff for assistance with ADLs.  PLAN: -Continue current scope of  treatment -Awaiting results of bone marrow biopsy -DNR/DNI  Case and plan discussed with Dr. Janese Banks   Time Total: 60 minutes  Visit consisted of counseling and education dealing with the complex and emotionally intense issues of symptom management and palliative care in the setting of serious and potentially life-threatening illness.Greater than 50%  of this time was spent counseling and coordinating care related to the above assessment and plan.  Signed by: Altha Harm, PhD, NP-C

## 2021-07-22 NOTE — Consult Note (Signed)
Chief Complaint: Patient was seen in consultation today for  Chief Complaint  Patient presents with   Anemia    Referring Physician(s): Dr. Janese Banks  Supervising Physician: Mir, Sharen Heck  Patient Status: [facility] - ED  History of Present Illness: Roberta [name] is a 76 y.o. female with a medical history significant for cardiomyopathy, COPD on 2 L continuous O2, CHF and lung cancer currently in remission. She is familiar to IR from port removal 06/14/21. She presented to the [facility] ED 07/21/21 with worsening fatigue and shortness of breath and was found to have a hemoglobin less than 5. She was admitted for further treatment including PRBC infusions. Negative hemoccult and no acute findings on CT chest/abdomen/pelvis.   Interventional Radiology has been asked to evaluate this patient for an image-guided bone marrow biopsy with aspiration.   Past Medical History:  Diagnosis Date   AAA (abdominal aortic aneurysm)    Anemia    Breast cancer ([place]) 2002   left   Cardiomyopathy ([place])    a. 09/2020 Echo: EF 25-30%, glob HK, Gr2 DD, mod reduced RV fxn. Mod dil LA. Mild to mod MR. Mod AS.   Chronic combined systolic and diastolic CHF (congestive heart failure) ([place])    a. 09/2020 Echo: EF 25-30%, glob HK, gr2 DD.   COPD (chronic obstructive pulmonary disease) (HCC)    Coronary artery calcification seen on CT scan    Dyspnea    Headache    High cholesterol    History of stress test    a. 10/2016 Low risk MV. EF 60%.   Mitral regurgitation    Moderate aortic stenosis    Personal history of chemotherapy    Small cell lung cancer ([place])    Tobacco abuse     Past Surgical History:  Procedure Laterality Date   ABDOMINAL HYSTERECTOMY     ARTERY BIOPSY Right 12/05/2017   Procedure: BIOPSY TEMPORAL ARTERY;  Surgeon: Margaretha Sheffield, MD;  Location: [facility] ORS;  Service: ENT;  Laterality: Right;   ENDOBRONCHIAL ULTRASOUND N/A 02/19/2017   Procedure: ENDOBRONCHIAL ULTRASOUND;  Surgeon: Flora Lipps, MD;   Location: [facility] ORS;  Service: Cardiopulmonary;  Laterality: N/A;   ENDOVASCULAR REPAIR/STENT GRAFT N/A 12/06/2016   Procedure: Endovascular Repair/Stent Graft;  Surgeon: Katha Cabal, MD;  Location: [facility];  Service: Cardiovascular;  Laterality: N/A;   HIP ARTHROPLASTY Left 10/26/2020   Procedure: ARTHROPLASTY BIPOLAR HIP (HEMIARTHROPLASTY);  Surgeon: Renee Harder, MD;  Location: [facility] ORS;  Service: Orthopedics;  Laterality: Left;   IR FLUORO GUIDE PORT INSERTION RIGHT  03/02/2017   IR REMOVAL TUN ACCESS W/ PORT W/O FL MOD SED  06/14/2021   KYPHOPLASTY N/A 06/11/2018   Procedure: KYPHOPLASTY L4 BIOPSY WITH RFA;  Surgeon: Hessie Knows, MD;  Location: [facility] ORS;  Service: Orthopedics;  Laterality: N/A;   KYPHOPLASTY N/A 12/16/2019   Procedure: T6 KYPHOPLASTY;  Surgeon: Hessie Knows, MD;  Location: [facility] ORS;  Service: Orthopedics;  Laterality: N/A;   MASTECTOMY Left 2002   with sentinel node     Allergies: Patient has no known allergies.  Medications: Prior to Admission medications   Medication Sig Start Date End Date Taking? Authorizing Provider  acetaminophen (TYLENOL) 500 MG tablet Take 1,000 mg by mouth every 8 (eight) hours as needed for mild pain.    [provider]  albuterol (PROVENTIL HFA;VENTOLIN HFA) 108 (90 Base) MCG/ACT inhaler Inhale 2 puffs into the lungs every 6 (six) hours as needed for wheezing or shortness of breath. 03/23/16  Frederich Cha, MD  aspirin EC 81 MG EC tablet Take 1 tablet (81 mg total) by mouth daily. Swallow whole. 09/09/20   Bonnielee Haff, MD  atorvastatin (LIPITOR) 10 MG tablet Take 10 mg by mouth at bedtime.  08/11/16   [provider]  fluticasone-salmeterol (ADVAIR HFA) 115-21 MCG/ACT inhaler Inhale 2 puffs into the lungs 2 (two) times daily.    [provider]  furosemide (LASIX) 20 MG tablet Take 1 tablet (20 mg total) by mouth every Monday, Wednesday, and Friday. 10/18/20   Ralene Muskrat B, MD   ipratropium (ATROVENT) 0.02 % nebulizer solution Take 3 mLs by nebulization in the morning, at noon, in the evening, and at bedtime. 10/07/20 10/07/21  [provider]  loperamide (IMODIUM) 2 MG capsule Take 1 capsule (2 mg total) by mouth as needed for diarrhea or loose stools. 11/25/20   Terrilee Croak, MD  losartan (COZAAR) 25 MG tablet Take 1 tablet (25 mg total) by mouth daily. 12/28/20 06/14/21  Minna Merritts, MD  magnesium hydroxide (MILK OF MAGNESIA) 400 MG/5ML suspension Take 30 mLs by mouth daily as needed for mild constipation. 11/25/20   Dahal, Marlowe Aschoff, MD  melatonin 5 MG TABS Take 0.5 tablets (2.5 mg total) by mouth at bedtime. 11/25/20   Terrilee Croak, MD  metoprolol succinate (TOPROL XL) 25 MG 24 hr tablet Take 0.5 tablets (12.5 mg total) by mouth daily. 12/28/20   Minna Merritts, MD  nicotine (NICODERM CQ - DOSED IN MG/24 HOURS) 14 mg/24hr patch Place 1 patch (14 mg total) onto the skin daily. Patient not taking: Reported on 05/31/2021 10/18/20   Sidney Ace, MD  omeprazole (PRILOSEC) 20 MG capsule Take 1 capsule (20 mg total) by mouth daily. 11/22/20   Sindy Guadeloupe, MD  rOPINIRole (REQUIP) 0.5 MG tablet Take 0.5 mg by mouth at bedtime.    [provider]  traZODone (DESYREL) 50 MG tablet Take 1 tablet (50 mg total) by mouth at bedtime. 11/25/20   Terrilee Croak, MD  vitamin B-12 (CYANOCOBALAMIN) 1000 MCG tablet Take 1 tablet (1,000 mcg total) by mouth daily. 09/09/20   Bonnielee Haff, MD     Family History  Problem Relation Age of Onset   CAD Mother    Colon cancer Brother    Breast cancer Neg Hx     Social History   Socioeconomic History   Marital status: Widowed    Spouse name: Not on file   Number of children: Not on file   Years of education: Not on file   Highest education level: Not on file  Occupational History   Not on file  Tobacco Use   Smoking status: Former    Packs/day: 1.50    Years: 50.00    Pack years: 75.00    Types: Cigarettes    Smokeless tobacco: Never  Vaping Use   Vaping Use: Never used  Substance and Sexual Activity   Alcohol use: No   Drug use: No   Sexual activity: Never  Other Topics Concern   Not on file  Social History Narrative   Not on file   Social Determinants of Health   Financial Resource Strain: Not on file  Food Insecurity: Not on file  Transportation Needs: Not on file  Physical Activity: Not on file  Stress: Not on file  Social Connections: Not on file    Review of Systems: A 12 point ROS discussed and pertinent positives are indicated in the HPI above.  All other  systems are negative.  Review of Systems  Unable to perform ROS: Mental status change   Vital Signs: BP 102/60    Pulse 81    Temp 98.1 F (36.7 C) (Oral)    Resp 20    Ht _0  (1.702 m)    Wt 143 lb 4.8 oz (65 kg)    SpO2 100%    BMI 22.44 kg/m   Physical Exam Constitutional:      Appearance: She is ill-appearing.     Comments: Very hard of hearing, only able to answer simple questions and follow simple commands.   HENT:     Mouth/Throat:     Mouth: Mucous membranes are dry.     Pharynx: Oropharynx is clear.  Cardiovascular:     Rate and Rhythm: Normal rate and regular rhythm.     Pulses: Normal pulses.     Heart sounds: Normal heart sounds.  Pulmonary:     Effort: Pulmonary effort is normal.     Breath sounds: Normal breath sounds.  Abdominal:     General: Bowel sounds are normal.     Palpations: Abdomen is soft.  Musculoskeletal:     Right lower leg: No edema.     Left lower leg: No edema.  Skin:    Findings: Bruising present.     Comments: Numerous scattered bruises, some large.   Neurological:     Mental Status: She is disoriented.    Imaging: DG Chest Portable 1 View  Result Date: 07/21/2021 CLINICAL DATA:  Acute hypotension and anemia. EXAM: PORTABLE CHEST 1 VIEW COMPARISON:  Chest radiograph dated 11/21/2020 and CT dated 12/16/2020. FINDINGS: Interval removal of the previously seen  Port-A-Cath. Postradiation changes and scarring in the right upper lobe. Interval development of small right pleural effusion. There is diffuse chronic interstitial coarsening. However, slight increase in interstitial markings compared to prior radiograph which may represent edema. Pneumonia isn't excluded. No pneumothorax. Stable cardiomediastinal silhouette. Atherosclerotic calcification of the aorta. Osteopenia with degenerative changes of the spine and midthoracic vertebroplasty. No acute osseous pathology. IMPRESSION: 1. Interval development of small right pleural effusion. 2. Slight increase in the interstitial markings compared to prior radiograph which may represent edema. Pneumonia is not excluded. Electronically Signed   By: Anner Crete M.D.   On: 07/21/2021 23:20   ECHOCARDIOGRAM COMPLETE  Result Date: 06/27/2021    ECHOCARDIOGRAM REPORT   Patient Name:   Roberta [name] Date of Exam: 06/27/2021 Medical Rec #:  [id number]         Height:       67.0 in Accession #:    [id number]        Weight:       138.0 lb Date of Birth:  04-Feb-1946         BSA:          1.727 m Patient Age:    41 years          BP:           130/76 mmHg Patient Gender: F                 HR:           84 bpm. Exam Location:  [facility] Procedure: 2D Echo, Color Doppler and Limited Color Doppler Indications:    I50.20* Unspecified systolic (congestive) heart failure  History:        Patient has prior history of Echocardiogram examinations, most  recent 10/13/2020. Cardiomyopathy and CHF, AAA, PAD and COPD,                 Aortic Valve Disease; Risk Factors:Dyslipidemia and Former                 Smoker.  Sonographer:    Pilar Jarvis RDMS, RVT, RDCS Referring Phys: [place] Comments: Technically challenging study due to limited acoustic windows. IMPRESSIONS  1. Left ventricular ejection fraction, by estimation, is 45 to 50%. Left ventricular ejection fraction by 2D MOD biplane is 49.4 %. The  left ventricle has mildly decreased function. The left ventricle demonstrates global hypokinesis. There is mild left ventricular hypertrophy. Left ventricular diastolic parameters are consistent with Grade I diastolic dysfunction (impaired relaxation).  2. Right ventricular systolic function is normal. The right ventricular size is normal.  3. The mitral valve is degenerative. Mild to moderate mitral valve regurgitation. Severe mitral annular calcification.  4. Tricuspid valve regurgitation is mild to moderate.  5. The aortic valve is calcified. Aortic valve regurgitation is not visualized. Mild aortic valve stenosis. Comparison(s): Previous AV pressure gradients with an LVEF of 25-30%: Max 13 mmHg, mean 25mHg. FINDINGS  Left Ventricle: Left ventricular ejection fraction, by estimation, is 45 to 50%. Left ventricular ejection fraction by 2D MOD biplane is 49.4 %. The left ventricle has mildly decreased function. The left ventricle demonstrates global hypokinesis. The left ventricular internal cavity size was normal in size. There is mild left ventricular hypertrophy. Left ventricular diastolic parameters are consistent with Grade I diastolic dysfunction (impaired relaxation). Right Ventricle: The right ventricular size is normal. No increase in right ventricular wall thickness. Right ventricular systolic function is normal. Left Atrium: Left atrial size was normal in size. Right Atrium: Right atrial size was normal in size. Pericardium: There is no evidence of pericardial effusion. Mitral Valve: The mitral valve is degenerative in appearance. Severe mitral annular calcification. Mild to moderate mitral valve regurgitation. Tricuspid Valve: The tricuspid valve is normal in structure. Tricuspid valve regurgitation is mild to moderate. Aortic Valve: The aortic valve is calcified. Aortic valve regurgitation is not visualized. Mild aortic stenosis is present. Aortic valve mean gradient measures 9.5 mmHg. Aortic valve  peak gradient measures 18.6 mmHg. Aortic valve area, by VTI measures 1.53 cm. Pulmonic Valve: The pulmonic valve was normal in structure. Pulmonic valve regurgitation is mild. Aorta: The aortic root and ascending aorta are structurally normal, with no evidence of dilitation. Venous: The inferior vena cava was not well visualized. IAS/Shunts: No atrial level shunt detected by color flow Doppler.  LEFT VENTRICLE PLAX 2D                        Biplane EF (MOD) LVIDd:         4.55 cm         LV Biplane EF:   Left LVIDs:         4.00 cm                          ventricular LV PW:         0.90 cm                          ejection LV IVS:        0.90 cm  fraction by LVOT diam:     2.00 cm                          2D MOD LV SV:         59                               biplane is LV SV Index:   34                               49.4 %. LVOT Area:     3.14 cm                                Diastology                                LV e' medial:    6.96 cm/s LV Volumes (MOD)               LV E/e' medial:  14.0 LV vol d, MOD    70.0 ml       LV e' lateral:   8.59 cm/s A2C:                           LV E/e' lateral: 11.3 LV vol d, MOD    72.3 ml A4C: LV vol s, MOD    35.7 ml A2C: LV vol s, MOD    36.7 ml A4C: LV SV MOD A2C:   34.3 ml LV SV MOD A4C:   72.3 ml LV SV MOD BP:    36.5 ml RIGHT VENTRICLE             IVC RV Basal diam:  3.10 cm     IVC diam: 1.80 cm RV Mid diam:    2.40 cm RV S prime:     11.60 cm/s TAPSE (M-mode): 2.4 cm LEFT ATRIUM             Index        RIGHT ATRIUM           Index LA diam:        3.00 cm 1.74 cm/m   RA Area:     12.20 cm LA Vol (A2C):   36.5 ml 21.13 ml/m  RA Volume:   24.70 ml  14.30 ml/m LA Vol (A4C):   28.0 ml 16.21 ml/m LA Biplane Vol: 33.3 ml 19.28 ml/m  AORTIC VALVE                     PULMONIC VALVE AV Area (Vmax):    1.72 cm      PV Vmax:          1.26 m/s AV Area (Vmean):   1.51 cm      PV Peak grad:     6.4 mmHg AV Area (VTI):     1.53 cm      PR End  Diast Vel: 4.84 msec AV Vmax:           215.50 cm/s AV Vmean:          142.000 cm/s AV VTI:  0.389 m AV Peak Grad:      18.6 mmHg AV Mean Grad:      9.5 mmHg LVOT Vmax:         118.00 cm/s LVOT Vmean:        68.300 cm/s LVOT VTI:          0.189 m LVOT/AV VTI ratio: 0.49  AORTA Ao Root diam: 3.00 cm Ao Asc diam:  2.40 cm Ao Arch diam: 2.4 cm MITRAL VALVE                TRICUSPID VALVE MV Area (PHT): 4.89 cm     TR Peak grad:   38.9 mmHg MV Decel Time: 155 msec     TR Vmax:        312.00 cm/s MV E velocity: 97.40 cm/s MV A velocity: 132.00 cm/s  SHUNTS MV E/A ratio:  0.74         Systemic VTI:  0.19 m                             Systemic Diam: 2.00 cm Kate Sable MD Electronically signed by Kate Sable MD Signature Date/Time: 06/27/2021/5:39:16 PM    Final    CT CHEST ABDOMEN PELVIS WO CONTRAST  Result Date: 07/22/2021 CLINICAL DATA:  Chronic dyspnea. Anemia and thrombocytopenia. History of small cell lung cancer. EXAM: CT CHEST, ABDOMEN AND PELVIS WITHOUT CONTRAST TECHNIQUE: Multidetector CT imaging of the chest, abdomen and pelvis was performed following the standard protocol without IV contrast. RADIATION DOSE REDUCTION: This exam was performed according to the departmental dose-optimization program which includes automated exposure control, adjustment of the mA and/or kV according to patient size and/or use of iterative reconstruction technique. COMPARISON:  Chest radiograph dated 07/21/2021 and CT dated 12/16/2020. CT abdomen pelvis dated 11/20/2020. FINDINGS: Evaluation of this exam is limited in the absence of intravenous contrast. CT CHEST FINDINGS Cardiovascular: There is no cardiomegaly or pericardial effusion. Coronary vascular calcification of the LAD. There is calcification of the mitral annulus. There is hypoattenuation of the cardiac blood pool suggestive of anemia. Clinical correlation is recommended. Moderate atherosclerotic calcification of the thoracic aorta. No aneurysmal  dilatation. The central pulmonary arteries are grossly unremarkable on this noncontrast CT. Mediastinum/Nodes: No hilar or mediastinal adenopathy. Evaluation however is very limited in the absence of contrast and due to post radiation changes of the right upper lobe. The esophagus is grossly unremarkable. No mediastinal fluid collection. Lungs/Pleura: Interval increase in the right pleural effusion since the prior CT with compressive atelectasis of the majority of the right lower lobe. Small left pleural effusion. Post radiation changes in the right upper lobe and perihilar region with architectural distortion, traction bronchiectasis, and volume loss. There is overall interval increase in the right perihilar soft tissue thickening since the prior CT. Recurrent disease is not excluded. PET-CT may provide better evaluation. Biapical subpleural scarring. No pneumothorax. 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 Musculoskeletal: Degenerative changes of the spine and osteopenia. Mild chronic compression fracture of superior endplate of T5. T6 vertebroplasty. No acute osseous pathology. CT ABDOMEN PELVIS FINDINGS No intra-abdominal free air or free fluid. Hepatobiliary: The liver is unremarkable. No intrahepatic biliary ductal dilatation. The gallbladder is unremarkable. Pancreas: Unremarkable. No pancreatic ductal dilatation or surrounding inflammatory changes. Spleen: Normal in size without focal abnormality. Adrenals/Urinary Tract: The adrenal glands unremarkable. There is no hydronephrosis or nephrolithiasis on either side. The  visualized ureters appear unremarkable. The urinary bladder is suboptimally visualized due to streak artifact caused by left hip arthroplasty. Stomach/Bowel: There is sigmoid diverticulosis with muscular hypertrophy. No definite active inflammatory changes. There is no bowel obstruction or active inflammation. The appendix is normal.  Vascular/Lymphatic: An aorto bi iliac endovascular stent graft repair noted. Evaluation is limited in the absence of contrast. The aorta is tortuous. There is moderate atherosclerotic calcification of the aorta. The IVC is grossly unremarkable. No portal venous gas. There is no adenopathy. Reproductive: The uterus is grossly unremarkable. No adnexal masses. Other: None Musculoskeletal: Osteopenia with scoliosis and degenerative changes of the spine. Old L4 compression fracture with anterior wedging and vertebroplasty as seen on the CT of 11/20/2020. Similar appearance of compression fracture of the inferior endplate of L1. No acute osseous pathology. Left hip arthroplasty. IMPRESSION: 1. Interval increase in the right pleural effusion since the prior CT with compressive atelectasis of the majority of the right lower lobe. Small left pleural effusion. 2. Post radiation changes in the right upper lobe and perihilar region. There is overall interval increase in the right perihilar soft tissue thickening since the prior CT. Recurrent disease is not excluded. PET-CT may provide better evaluation. 3. Sigmoid diverticulosis. No bowel obstruction. Normal appendix. 4. Aortic Atherosclerosis (ICD10-I70.0). Electronically Signed   By: Anner Crete M.D.   On: 07/22/2021 00:04    Labs:  CBC: Recent Labs    11/22/20 0713 12/16/20 0959 07/21/21 2225 07/22/21 0327  WBC 5.3 8.2 4.9   5.3 6.2  HGB 10.8* 9.0* 3.4*   3.3* 8.9*  HCT 35.2* 29.2* 10.9*   10.9* 26.7*  PLT 358 307 24*   27* 24*    COAGS: Recent Labs    10/13/20 0223 10/24/20 0459 11/21/20 0251 07/22/21 0016  INR 1.0 0.9 1.0 1.3*  APTT 36 23* 23* 29    BMP: Recent Labs    11/22/20 0713 11/23/20 0506 12/16/20 0959 07/21/21 2225  NA 135 133* 136 134*  K 3.5 3.4* 3.5 3.9  CL 98 93* 101 103  CO2 _0 GLUCOSE 95 102* 108* 119*  BUN _1 53*  CALCIUM 8.2* 8.2* 8.5* 7.1*  CREATININE 0.75 1.05* 0.67 1.74*  GFRNONAA >60 56*  >60 30*    LIVER FUNCTION TESTS: Recent Labs    10/24/20 0459 11/20/20 1508 12/16/20 0959 07/22/21 0016  BILITOT 0.7 0.5 0.6 1.8*  AST _2 ALT 55* _3 ALKPHOS 79 178* 99 120  PROT 6.5 5.6* 6.0* 4.8*  ALBUMIN 3.0* 2.6* 3.0* 2.3*    TUMOR MARKERS: No results for input(s): AFPTM, CEA, CA199, CHROMGRNA in the last 8760 hours.  Assessment and Plan:  Anemia: Roberta [name], 76 year old female, is scheduled today for an image-guided bone marrow biopsy with aspiration. The procedure was discussed and consent obtained over the phone with the patient's daughter Lesli Albee.  Risks and benefits of this procedure were discussed with the patient and/or patient's family including, but not limited to bleeding, infection, damage to adjacent structures or low yield requiring additional tests.  All of the questions were answered and there is agreement to proceed. She has been NPO.   Consent signed and in chart.  Thank you for this interesting consult.  I greatly enjoyed meeting DEBORH PENSE and look forward to participating in their care.  A copy of this report was sent to the requesting provider on this date.  Electronically Signed: Roselyn Reef  Manuella Ghazi [phone] 07/22/2021, 8:57 AM   I spent a total of 20 Minutes    in face to face in clinical consultation, greater than 50% of which was counseling/coordinating care for bone marrow biopsy with aspiration

## 2021-07-22 NOTE — ED Provider Notes (Signed)
Called, updated daughter to care at this point. Understands critical nature and possible need for transfer (depending on Dr. Elroy Channel recommendations after furhter lab review)  [place] requests a phone call if patient will need transfer.    Delman Kitten, MD 07/22/21 [phone]

## 2021-07-22 NOTE — ED Provider Notes (Signed)
Labs reviewed by Dr. Janese Banks who recommends admission to [facility], no need to transfer. She recommended blood transfusion but no platelets transfusion at this time. Vitals improving. Hospitalist consulted and after discussion has accepted patient to their service.   Alfred Levins, [place], MD 07/22/21 [phone]

## 2021-07-22 NOTE — ED Notes (Signed)
Pt fully cleaned, changed into fresh gown/brief, linen changed, chuck changed, given warm blanket, and hooked back up to monitoring equipment with the help of RN Ria Comment. Redness noted to sacral area with dimpling area in medial sacrum, MD Niu notified of findings, sacral pressure wound dressing applied to area.

## 2021-07-22 NOTE — Progress Notes (Signed)
°  Carryover admission to the Day Admitter.  I discussed this case with the EDP, Dr.Veronese.  Per these discussions:   This is a 76 year old female with history of lung cancer, chronic hypoxic respiratory failure on 2 L continuous nasal cannula, who is being admitted for symptomatic acute anemia.  The patient reportedly presented complaining of several days of shortness of breath associated with fatigue/generalized weakness in the absence of any melena, hematochezia, hematemesis.  Presenting hemoglobin noted to be 3.8 relative to baseline hemoglobin range of 9-10, with most recent prior hemoglobin 9.0 in July 2022.  Presenting platelet count noted to be 27.  DRE reportedly revealed brown-colored stool with Hemoccult negative finding.   Dr. Janese Banks Of heme-onc has been consulted, and has reviewed the patient's preliminary labs, including review of peripheral smear.  Dr. Janese Banks Does not feel that presentation is consistent with acute leukemia.  She recommends admission to the hospitalists at [facility], and will formally see the patient in the morning, recommending interval PRBC transfusion, while also recommending that we refrain from platelet transfusion given no evidence of bleed.   Thus far, the patient has received 2 units PRBC, with improvement in initial systolic blood pressures from the 80s/low 90s mmHg, now into the low 100's mmHg.   I have placed an order to admit to pcu for further evaluation and management of acute symptomatic anemia. I have also placed some preliminary admit orders via the adult multi morbid admission order set.  I have ordered an additional 1 unit PRBC to be transfused, with morning labs to be collected following completion of this transfusion.  Have also ordered a repeat H&H to occur at [place], DO Hospitalist

## 2021-07-22 NOTE — ED Notes (Signed)
Pt sleeping at this time. NAD noted, respirations even and unlabored. °

## 2021-07-22 NOTE — H&P (Addendum)
History and Physical    Roberta [name] HTD:[id number] DOB: 11-05-1945 DOA: 07/21/2021  Referring MD/NP/PA:   PCP: Gayland Curry, MD   Patient coming from:  The patient is coming from home.  At baseline, pt is independent for most of ADL.        Chief Complaint: weakness and fatigue, SOB  HPI: Roberta [name] is a 76 y.o. female with medical history significant of lung cancer, breast cancer, hypertension, hyperlipidemia, COPD on 2 L oxygen, GERD, depression, tobacco abuse, mitral valve regurgitation, aortic valve stenosis, AAA, anemia, CHF with EF of 45-50%, who presents with weakness and fatigue.  Patient states that she has not been feeling well in the past several days.  She has generalized weakness and fatigue, which has been progressively worsening.  Patient also has worsening shortness of breath.  She has chronic dry cough due to COPD, which has not changed.  No chest pain.  No fever or chills.  Denies dark stool or rectal bleeding.  No unilateral numbness or tinglings in extremities.  No facial droop or slurred speech.   Patient was initially hypotensive with blood pressure 81/27, which improved to 101/61 after giving 2 units of blood transfusion.  Data Reviewed and ED Course: pt was found to have hemoglobin dropped from 9.0 on 12/16/20 to 3.3 today, negative FOBT per EDP's note, platelet 27, WBC 5.3, negative COVID PCR, LDH 342, anemia panel not consistent with iron deficiency.  Vitamin B12 is elevated.  INR 1.3, PTT 29, BNP 248, AKI with Cre 1.42, BUN 48 and GFR 39 (baseline creatinine 0.67 on 12/16/2020), temperature normal, heart rate 59-80s, RR 27, oxygen saturation 100% on 3 L oxygen. Pt is admitted to PCU as inpt. Dr. Janese Banks of hematology is consulted.  CXR: 1. Interval development of small right pleural effusion. 2. Slight increase in the interstitial markings compared to prior radiograph which may represent edema. Pneumonia is not excluded.  CT-chest/abd/pelvis: 1.  Interval increase in the right pleural effusion since the prior CT with compressive atelectasis of the majority of the right lower lobe. Small left pleural effusion. 2. Post radiation changes in the right upper lobe and perihilar region. There is overall interval increase in the right perihilar soft tissue thickening since the prior CT. Recurrent disease is not excluded. PET-CT may provide better evaluation. 3. Sigmoid diverticulosis. No bowel obstruction. Normal appendix. 4. Aortic Atherosclerosis (ICD10-I70.0).   EKG: I have personally reviewed.  Sinus rhythm, QTc 493, low voltage, left atrial enlargement.  Review of Systems:   General: no fevers, chills, no body weight gain,  has fatigue HEENT: no blurry vision, hearing changes or sore throat Respiratory: has dyspnea, coughing, no wheezing CV: no chest pain, no palpitations GI: no nausea, vomiting, abdominal pain, diarrhea, constipation GU: no dysuria, burning on urination, increased urinary frequency, hematuria  Ext: has leg edema Neuro: no unilateral weakness, numbness, or tingling, no vision change or hearing loss Skin: no rash, no skin tear. MSK: No muscle spasm, no deformity, no limitation of range of movement in spin Heme: No easy bruising.  Travel history: No recent long distant travel.   Allergy: No Known Allergies  Past Medical History:  Diagnosis Date   AAA (abdominal aortic aneurysm)    Anemia    Breast cancer ([place]) 2002   left   Cardiomyopathy ([place])    a. 09/2020 Echo: EF 25-30%, glob HK, Gr2 DD, mod reduced RV fxn. Mod dil LA. Mild to mod MR. Mod AS.   Chronic combined systolic  and diastolic CHF (congestive heart failure) ([place])    a. 09/2020 Echo: EF 25-30%, glob HK, gr2 DD.   COPD (chronic obstructive pulmonary disease) (HCC)    Coronary artery calcification seen on CT scan    Dyspnea    Headache    High cholesterol    History of stress test    a. 10/2016 Low risk MV. EF 60%.   Mitral regurgitation     Moderate aortic stenosis    Personal history of chemotherapy    Small cell lung cancer ([place])    Tobacco abuse     Past Surgical History:  Procedure Laterality Date   ABDOMINAL HYSTERECTOMY     ARTERY BIOPSY Right 12/05/2017   Procedure: BIOPSY TEMPORAL ARTERY;  Surgeon: Margaretha Sheffield, MD;  Location: [facility];  Service: ENT;  Laterality: Right;   ENDOBRONCHIAL ULTRASOUND N/A 02/19/2017   Procedure: ENDOBRONCHIAL ULTRASOUND;  Surgeon: Flora Lipps, MD;  Location: [facility];  Service: Cardiopulmonary;  Laterality: N/A;   ENDOVASCULAR REPAIR/STENT GRAFT N/A 12/06/2016   Procedure: Endovascular Repair/Stent Graft;  Surgeon: Katha Cabal, MD;  Location: [facility];  Service: Cardiovascular;  Laterality: N/A;   HIP ARTHROPLASTY Left 10/26/2020   Procedure: ARTHROPLASTY BIPOLAR HIP (HEMIARTHROPLASTY);  Surgeon: Renee Harder, MD;  Location: [facility];  Service: Orthopedics;  Laterality: Left;   IR FLUORO GUIDE PORT INSERTION RIGHT  03/02/2017   IR REMOVAL TUN ACCESS W/ PORT W/O FL MOD SED  06/14/2021   KYPHOPLASTY N/A 06/11/2018   Procedure: KYPHOPLASTY L4 BIOPSY WITH RFA;  Surgeon: Hessie Knows, MD;  Location: [facility];  Service: Orthopedics;  Laterality: N/A;   KYPHOPLASTY N/A 12/16/2019   Procedure: T6 KYPHOPLASTY;  Surgeon: Hessie Knows, MD;  Location: [facility];  Service: Orthopedics;  Laterality: N/A;   MASTECTOMY Left 2002   with sentinel node     Social History:  reports that she has quit smoking. Her smoking use included cigarettes. She has a 75.00 pack-year smoking history. She has never used smokeless tobacco. She reports that she does not drink alcohol and does not use drugs.  Family History:  Family History  Problem Relation Age of Onset   CAD Mother    Colon cancer Brother    Breast cancer Neg Hx      Prior to Admission medications   Medication Sig Start Date End Date Taking? Authorizing Provider  acetaminophen (TYLENOL) 500 MG tablet Take 1,000 mg by mouth  every 8 (eight) hours as needed for mild pain.   Yes [provider]  aspirin EC 81 MG EC tablet Take 1 tablet (81 mg total) by mouth daily. Swallow whole. 09/09/20  Yes Bonnielee Haff, MD  atorvastatin (LIPITOR) 10 MG tablet Take 10 mg by mouth at bedtime.  08/11/16  Yes [provider]  Cobalamin Combinations (VITAMIN B12-FOLIC ACID) 322-025 MCG TABS Take 1 tablet by mouth daily.   Yes [provider]  fluticasone-salmeterol (ADVAIR HFA) 115-21 MCG/ACT inhaler Inhale 2 puffs into the lungs 2 (two) times daily.   Yes [provider]  ipratropium (ATROVENT) 0.02 % nebulizer solution Take 3 mLs by nebulization in the morning, at noon, in the evening, and at bedtime. 10/07/20 10/07/21 Yes [provider]  melatonin 3 MG TABS tablet Take 9 mg by mouth at bedtime.   Yes [provider]  midodrine (PROAMATINE) 5 MG tablet Take 5 mg by mouth 2 (two) times daily with a meal.   Yes [provider]  omeprazole (PRILOSEC) 20 MG capsule  Take 1 capsule (20 mg total) by mouth daily. 11/22/20  Yes Sindy Guadeloupe, MD  rOPINIRole (REQUIP) 0.5 MG tablet Take 0.5 mg by mouth at bedtime.   Yes [provider]  senna-docusate (SENOKOT-S) 8.6-50 MG tablet Take 1 tablet by mouth at bedtime.   Yes [provider]  traZODone (DESYREL) 50 MG tablet Take 1 tablet (50 mg total) by mouth at bedtime. 11/25/20  Yes Dahal, Marlowe Aschoff, MD  albuterol (PROVENTIL HFA;VENTOLIN HFA) 108 (90 Base) MCG/ACT inhaler Inhale 2 puffs into the lungs every 6 (six) hours as needed for wheezing or shortness of breath. 03/23/16   Frederich Cha, MD  furosemide (LASIX) 20 MG tablet Take 1 tablet (20 mg total) by mouth every Monday, Wednesday, and Friday. 10/18/20   Sidney Ace, MD  loperamide (IMODIUM) 2 MG capsule Take 1 capsule (2 mg total) by mouth as needed for diarrhea or loose stools. 11/25/20   Terrilee Croak, MD  losartan (COZAAR) 25 MG tablet Take 1 tablet (25 mg  total) by mouth daily. 12/28/20 06/14/21  Minna Merritts, MD  magnesium hydroxide (MILK OF MAGNESIA) 400 MG/5ML suspension Take 30 mLs by mouth daily as needed for mild constipation. 11/25/20   Dahal, Marlowe Aschoff, MD  melatonin 5 MG TABS Take 0.5 tablets (2.5 mg total) by mouth at bedtime. Patient not taking: Reported on 07/22/2021 11/25/20   Terrilee Croak, MD  metoprolol succinate (TOPROL XL) 25 MG 24 hr tablet Take 0.5 tablets (12.5 mg total) by mouth daily. 12/28/20   Minna Merritts, MD  nicotine (NICODERM CQ - DOSED IN MG/24 HOURS) 14 mg/24hr patch Place 1 patch (14 mg total) onto the skin daily. Patient not taking: Reported on 05/31/2021 10/18/20   Sidney Ace, MD  vitamin B-12 (CYANOCOBALAMIN) 1000 MCG tablet Take 1 tablet (1,000 mcg total) by mouth daily. Patient not taking: Reported on 07/22/2021 09/09/20   Bonnielee Haff, MD    Physical Exam: Vitals:   07/22/21 1036 07/22/21 1108 07/22/21 1130 07/22/21 1200  BP: (!) 105/57 (!) 104/56 107/60 (!) 104/49  Pulse: 83 84 79 80  Resp: _0 Temp:      TempSrc:      SpO2: 99% 100% 98% 98%  Weight:      Height:       General: Not in acute distress. Pale looking HEENT:       Eyes: PERRL, EOMI, no scleral icterus.       ENT: No discharge from the ears and nose, no pharynx injection, no tonsillar enlargement.        Neck: No JVD, no bruit, no mass felt. Heme: No neck lymph node enlargement. Cardiac: S1/S2, RRR, No gallops or rubs. Respiratory: No rales, wheezing, rhonchi or rubs. GI: Soft, nondistended, nontender, no rebound pain, no organomegaly, BS present. GU: No hematuria Ext: 1+ pitting leg edema bilaterally. 1+DP/PT pulse bilaterally. Musculoskeletal: No joint deformities, No joint redness or warmth, no limitation of ROM in spin. Skin: No rashes.  Has bruise in left arm Neuro: Alert, oriented X3, cranial nerves II-XII grossly intact, moves all extremities normally.  Psych: Patient is not psychotic, no suicidal or hemocidal  ideation.  Labs on Admission: I have personally reviewed following labs and imaging studies  CBC: Recent Labs  Lab 07/21/21 2225 07/22/21 0327  WBC 4.9   5.3 6.2  NEUTROABS 1.5* 3.0  HGB 3.4*   3.3* 8.9*  HCT 10.9*   10.9* 26.7*  MCV 105.8*   107.9* 93.7  PLT 24*   27* 24*   Basic Metabolic Panel: Recent Labs  Lab 07/21/21 2225 07/22/21 0327  NA 134* 132*  K 3.9 4.1  CL 103 102  CO2 24 22  GLUCOSE 119* 110*  BUN 53* 48*  CREATININE 1.74* 1.42*  CALCIUM 7.1* 7.4*  MG  --  2.2   GFR: Estimated Creatinine Clearance: 33.8 mL/min (A) (by C-G formula based on SCr of 1.42 mg/dL (H)). Liver Function Tests: Recent Labs  Lab 07/22/21 0016 07/22/21 0327  AST 27 20  ALT 14 14  ALKPHOS 120 110  BILITOT 1.8* 1.3*  PROT 4.8* 5.1*  ALBUMIN 2.3* 2.4*   No results for input(s): LIPASE, AMYLASE in the last 168 hours. No results for input(s): AMMONIA in the last 168 hours. Coagulation Profile: Recent Labs  Lab 07/22/21 0016  INR 1.3*   Cardiac Enzymes: No results for input(s): CKTOTAL, CKMB, CKMBINDEX, TROPONINI in the last 168 hours. BNP (last 3 results) No results for input(s): PROBNP in the last 8760 hours. HbA1C: No results for input(s): HGBA1C in the last 72 hours. CBG: No results for input(s): GLUCAP in the last 168 hours. Lipid Profile: No results for input(s): CHOL, HDL, LDLCALC, TRIG, CHOLHDL, LDLDIRECT in the last 72 hours. Thyroid Function Tests: No results for input(s): TSH, T4TOTAL, FREET4, T3FREE, THYROIDAB in the last 72 hours. Anemia Panel: Recent Labs    07/22/21 0016  VITAMINB12 3,776*  FOLATE 9.3  FERRITIN 53  TIBC 196*  IRON 182*  RETICCTPCT 2.2   Urine analysis:    Component Value Date/Time   COLORURINE YELLOW (A) 11/20/2020 1514   APPEARANCEUR HAZY (A) 11/20/2020 1514   LABSPEC 1.039 (H) 11/20/2020 1514   PHURINE 7.0 11/20/2020 1514   GLUCOSEU 150 (A) 11/20/2020 1514   HGBUR NEGATIVE 11/20/2020 1514   BILIRUBINUR NEGATIVE  11/20/2020 1514   KETONESUR NEGATIVE 11/20/2020 1514   PROTEINUR NEGATIVE 11/20/2020 1514   NITRITE POSITIVE (A) 11/20/2020 1514   LEUKOCYTESUR LARGE (A) 11/20/2020 1514   Sepsis Labs: _0 (procalcitonin:4,lacticidven:4) ) Recent Results (from the past 240 hour(s))  Resp Panel by RT-PCR (Flu A&B, Covid) Nasopharyngeal Swab     Status: None   Collection Time: 07/22/21  3:27 AM   Specimen: Nasopharyngeal Swab; Nasopharyngeal(NP) swabs in vial transport medium  Result Value Ref Range Status   SARS Coronavirus 2 by RT PCR NEGATIVE NEGATIVE Final    Comment: (NOTE) SARS-CoV-2 target nucleic acids are NOT DETECTED.  The SARS-CoV-2 RNA is generally detectable in upper respiratory specimens during the acute phase of infection. The lowest concentration of SARS-CoV-2 viral copies this assay can detect is 138 copies/mL. A negative result does not preclude SARS-Cov-2 infection and should not be used as the sole basis for treatment or other patient management decisions. A negative result may occur with  improper specimen collection/handling, submission of specimen other than nasopharyngeal swab, presence of viral mutation(s) within the areas targeted by this assay, and inadequate number of viral copies(<138 copies/mL). A negative result must be combined with clinical observations, patient history, and epidemiological information. The expected result is Negative.  Fact Sheet for Patients:  EntrepreneurPulse.com.au  Fact Sheet for Healthcare Providers:  IncredibleEmployment.be  This test is no t yet approved or cleared by the [place] FDA and  has been authorized for detection and/or diagnosis of SARS-CoV-2 by FDA under an Emergency Use Authorization (EUA). This EUA will remain  in effect (meaning this test can be used) for the duration of the COVID-19 declaration under Section 564(b)(1)  of the Act, 21 U.S.C.section 360bbb-3(b)(1), unless the  authorization is terminated  or revoked sooner.       Influenza A by PCR NEGATIVE NEGATIVE Final   Influenza B by PCR NEGATIVE NEGATIVE Final    Comment: (NOTE) The Xpert Xpress SARS-CoV-2/FLU/RSV plus assay is intended as an aid in the diagnosis of influenza from Nasopharyngeal swab specimens and should not be used as a sole basis for treatment. Nasal washings and aspirates are unacceptable for Xpert Xpress SARS-CoV-2/FLU/RSV testing.  Fact Sheet for Patients: EntrepreneurPulse.com.au  Fact Sheet for Healthcare Providers: IncredibleEmployment.be  This test is not yet approved or cleared by the [place] FDA and has been authorized for detection and/or diagnosis of SARS-CoV-2 by FDA under an Emergency Use Authorization (EUA). This EUA will remain in effect (meaning this test can be used) for the duration of the COVID-19 declaration under Section 564(b)(1) of the Act, 21 U.S.C. section 360bbb-3(b)(1), unless the authorization is terminated or revoked.  Performed at [facility], [address] [place], [place] [address]      Radiological Exams on Admission: DG Chest Portable 1 View  Result Date: 07/21/2021 CLINICAL DATA:  Acute hypotension and anemia. EXAM: PORTABLE CHEST 1 VIEW COMPARISON:  Chest radiograph dated 11/21/2020 and CT dated 12/16/2020. FINDINGS: Interval removal of the previously seen Port-A-Cath. Postradiation changes and scarring in the right upper lobe. Interval development of small right pleural effusion. There is diffuse chronic interstitial coarsening. However, slight increase in interstitial markings compared to prior radiograph which may represent edema. Pneumonia isn't excluded. No pneumothorax. Stable cardiomediastinal silhouette. Atherosclerotic calcification of the aorta. Osteopenia with degenerative changes of the spine and midthoracic vertebroplasty. No acute osseous pathology. IMPRESSION: 1. Interval  development of small right pleural effusion. 2. Slight increase in the interstitial markings compared to prior radiograph which may represent edema. Pneumonia is not excluded. Electronically Signed   By: Anner Crete M.D.   On: 07/21/2021 23:20   CT CHEST ABDOMEN PELVIS WO CONTRAST  Result Date: 07/22/2021 CLINICAL DATA:  Chronic dyspnea. Anemia and thrombocytopenia. History of small cell lung cancer. EXAM: CT CHEST, ABDOMEN AND PELVIS WITHOUT CONTRAST TECHNIQUE: Multidetector CT imaging of the chest, abdomen and pelvis was performed following the standard protocol without IV contrast. RADIATION DOSE REDUCTION: This exam was performed according to the departmental dose-optimization program which includes automated exposure control, adjustment of the mA and/or kV according to patient size and/or use of iterative reconstruction technique. COMPARISON:  Chest radiograph dated 07/21/2021 and CT dated 12/16/2020. CT abdomen pelvis dated 11/20/2020. FINDINGS: Evaluation of this exam is limited in the absence of intravenous contrast. CT CHEST FINDINGS Cardiovascular: There is no cardiomegaly or pericardial effusion. Coronary vascular calcification of the LAD. There is calcification of the mitral annulus. There is hypoattenuation of the cardiac blood pool suggestive of anemia. Clinical correlation is recommended. Moderate atherosclerotic calcification of the thoracic aorta. No aneurysmal dilatation. The central pulmonary arteries are grossly unremarkable on this noncontrast CT. Mediastinum/Nodes: No hilar or mediastinal adenopathy. Evaluation however is very limited in the absence of contrast and due to post radiation changes of the right upper lobe. The esophagus is grossly unremarkable. No mediastinal fluid collection. Lungs/Pleura: Interval increase in the right pleural effusion since the prior CT with compressive atelectasis of the majority of the right lower lobe. Small left pleural effusion. Post radiation  changes in the right upper lobe and perihilar region with architectural distortion, traction bronchiectasis, and volume loss. There is overall interval increase in the right perihilar  soft tissue thickening since the prior CT. Recurrent disease is not excluded. PET-CT may provide better evaluation. Biapical subpleural scarring. No pneumothorax. 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 Musculoskeletal: Degenerative changes of the spine and osteopenia. Mild chronic compression fracture of superior endplate of T5. T6 vertebroplasty. No acute osseous pathology. CT ABDOMEN PELVIS FINDINGS No intra-abdominal free air or free fluid. Hepatobiliary: The liver is unremarkable. No intrahepatic biliary ductal dilatation. The gallbladder is unremarkable. Pancreas: Unremarkable. No pancreatic ductal dilatation or surrounding inflammatory changes. Spleen: Normal in size without focal abnormality. Adrenals/Urinary Tract: The adrenal glands unremarkable. There is no hydronephrosis or nephrolithiasis on either side. The visualized ureters appear unremarkable. The urinary bladder is suboptimally visualized due to streak artifact caused by left hip arthroplasty. Stomach/Bowel: There is sigmoid diverticulosis with muscular hypertrophy. No definite active inflammatory changes. There is no bowel obstruction or active inflammation. The appendix is normal. Vascular/Lymphatic: An aorto bi iliac endovascular stent graft repair noted. Evaluation is limited in the absence of contrast. The aorta is tortuous. There is moderate atherosclerotic calcification of the aorta. The IVC is grossly unremarkable. No portal venous gas. There is no adenopathy. Reproductive: The uterus is grossly unremarkable. No adnexal masses. Other: None Musculoskeletal: Osteopenia with scoliosis and degenerative changes of the spine. Old L4 compression fracture with anterior wedging and vertebroplasty as seen on the  CT of 11/20/2020. Similar appearance of compression fracture of the inferior endplate of L1. No acute osseous pathology. Left hip arthroplasty. IMPRESSION: 1. Interval increase in the right pleural effusion since the prior CT with compressive atelectasis of the majority of the right lower lobe. Small left pleural effusion. 2. Post radiation changes in the right upper lobe and perihilar region. There is overall interval increase in the right perihilar soft tissue thickening since the prior CT. Recurrent disease is not excluded. PET-CT may provide better evaluation. 3. Sigmoid diverticulosis. No bowel obstruction. Normal appendix. 4. Aortic Atherosclerosis (ICD10-I70.0). Electronically Signed   By: Anner Crete M.D.   On: 07/22/2021 00:04      Assessment/Plan Principal Problem:   Symptomatic anemia Active Problems:   COPD (chronic obstructive pulmonary disease) (HCC)   Hyperlipidemia   Small cell lung cancer, right upper lobe (HCC)   AKI (acute kidney injury) (HCC)   Chronic combined systolic and diastolic heart failure (HCC)   HTN (hypertension)   Depression   Thrombocytopenia (HCC)   Hypotension   Symptomatic anemia and Thrombocytopenia: Hgb 9.0 --> 3.3. platelet 27. No active bleeding.  FOBT negative, less likely to have upper GI bleeding.  Etiology is not clear.  Dr. Janese Banks of hematology is consulted.  Bone marrow biopsy was performed.  LDH 342.  Anemia panel showed no iron deficiency or vitamin B12 deficiency.  -Admitted to PCU as inpatient -Transfuse 3 unit of blood -Follow-up CBC every 8 hours -Peripheral smear  Chronic combined systolic and diastolic heart failure: 2D echo on 06/27/2020 showed EF of 45-50% with grade 1 diastolic dysfunction.  Patient has 1+ leg edema, elevated BNP 248.  Patient also complains of shortness breath, which likely multifactorial etiology including worsening anemia.  Patient has fluid overload, but due to hypotension, cannot start diuretics. -Watch volume  status closely  COPD (chronic obstructive pulmonary disease) ([place]): No wheezing or rhonchi on auscultation. -Bronchodilators  Hyperlipidemia -Lipitor  Small cell lung cancer, right upper lobe [facility]): s/p of XRT -f/u with oncology  AKI (acute kidney injury) ([place]): May be due to ATN secondary to  hypotension.  Blood pressure has improved with blood transfusion. -receiving blood transfusion -Hold Cozaar, Lasix  HTN (hypertension) -Hold blood pressure medications due to hypotension  Depression -Continue home medications  Hypotension: Initial blood pressure 81/27, which improved to 101/61.  Possibly due to volume depletion secondary to severe anemia -Hold blood pressure medications -Patient is receiving blood transfusion -Midodrine 10 mg tid        DVT ppx: SCD  Code Status: DNR per her daughter  Family Communication:  Yes, patient's   daughter  by phone  Disposition Plan:  Anticipate discharge back to previous environment  Consults called:  Dr. Janese Banks of hematology  Admission status and Level of care: Progressive:   as inpt         Severity of Illness:  The appropriate patient status for this patient is INPATIENT. Inpatient status is judged to be reasonable and necessary in order to provide the required intensity of service to ensure the patient's safety. The patient's presenting symptoms, physical exam findings, and initial radiographic and laboratory data in the context of their chronic comorbidities is felt to place them at high risk for further clinical deterioration. Furthermore, it is not anticipated that the patient will be medically stable for discharge from the hospital within 2 midnights of admission.   * I certify that at the point of admission it is my clinical judgment that the patient will require inpatient hospital care spanning beyond 2 midnights from the point of admission due to high intensity of service, high risk for further deterioration and high  frequency of surveillance required.*       Date of Service 07/22/2021    Ivor Costa [facility]   If 7PM-7AM, please contact night-coverage [email or web address] 07/22/2021, 12:51 PM

## 2021-07-22 NOTE — Procedures (Signed)
Interventional Radiology Procedure Note  Indication: Anemia  Procedure: CT guided aspirate and core biopsy of right iliac bone  Complications: None  Bleeding: Minimal  Miachel Roux, MD [phone]

## 2021-07-22 NOTE — ED Notes (Signed)
Blood transfusion completed at this time. Labs to be drawn in 1 hour.

## 2021-07-22 NOTE — ED Notes (Signed)
Blood transfusion (last unit) almost complete. When transfusion is completed, morning labs are to be drawn 1 hour after completion per lab.

## 2021-07-22 NOTE — ED Notes (Signed)
MD Niu at bedside  

## 2021-07-22 NOTE — ED Notes (Signed)
Pt taken to specials at this time

## 2021-07-22 NOTE — Consult Note (Signed)
Hematology/Oncology Consult note [facility] Telephone:([phone] [phone]  Patient Care Team: Gayland Curry, MD as PCP - General (Family Medicine) Rockey Situ Kathlene November, MD as PCP - Cardiology (Cardiology) Gayland Curry, MD (Family Medicine) Telford Nab, RN as Registered Nurse Noreene Filbert, MD as Referring Physician (Radiation Oncology) Lequita Asal, MD (Inactive) as Consulting Physician (Hematology and Oncology) Hessie Knows, MD as Consulting Physician (Orthopedic Surgery)   Name of the patient: Roberta [name]  [id number]  11-15-1945    Reason for consult: Anemia and thrombocytopenia   Requesting physician: Dr. Delman Kitten  Date of visit: 07/22/2021    History of presenting illness-patient is a 76 year old female with a history of limited stage small cell lung cancer treated with concurrent chemoradiation with carboplatin and etoposideBack in 2018.  She was in remission for over 4 years from that standpoint and recently had her port taken out on 06/14/2021.  At baseline patient has anemia of chronic disease and her hemoglobin runs around 9.  She is a resident of nursing home and in general having more declining overall health.  She presented to the ER with worsening shortness of breath and was found to have a hemoglobin of 3.3 with platelet count of 27.  Denies any bleeding.  ECOG PS- 4  Pain scale- 0   Review of systems- Review of Systems  Constitutional:  Positive for malaise/fatigue. Negative for chills, fever and weight loss.  HENT:  Negative for congestion, ear discharge and nosebleeds.   Eyes:  Negative for blurred vision.  Respiratory:  Negative for cough, hemoptysis, sputum production, shortness of breath and wheezing.   Cardiovascular:  Negative for chest pain, palpitations, orthopnea and claudication.  Gastrointestinal:  Negative for abdominal pain, blood in stool, constipation, diarrhea, heartburn, melena, nausea  and vomiting.  Genitourinary:  Negative for dysuria, flank pain, frequency, hematuria and urgency.  Musculoskeletal:  Negative for back pain, joint pain and myalgias.  Skin:  Negative for rash.  Neurological:  Negative for dizziness, tingling, focal weakness, seizures, weakness and headaches.  Endo/Heme/Allergies:  Does not bruise/bleed easily.  Psychiatric/Behavioral:  Negative for depression and suicidal ideas. The patient does not have insomnia.    No Known Allergies  Patient Active Problem List   Diagnosis Date Noted   Acute anemia 07/22/2021   Symptomatic anemia 07/22/2021   Chronic combined systolic and diastolic heart failure ([place]) 07/22/2021   HTN (hypertension) 07/22/2021   Depression 07/22/2021   Hypotension 07/22/2021   Thrombocytopenia (HCC)    Palliative care encounter    Delirium    B12 deficiency    Acute respiratory failure with hypoxia (HCC)    Hypervolemia    Acute systolic CHF (congestive heart failure) (HCC)    Closed fracture of first lumbar vertebra (HCC)    Diarrhea    Anemia due to vitamin B12 deficiency    UTI (urinary tract infection) 11/20/2020   Malnutrition of moderate degree 10/25/2020   Closed left subtrochanteric femur fracture (HCC) 10/24/2020   Chronic HFrEF (heart failure with reduced ejection fraction) (HCC)    COPD with acute exacerbation ([place]) 44/96/7591   Acute metabolic encephalopathy 63/84/6659   Elevated troponin 09/07/2020   AKI (acute kidney injury) ([place]) 09/07/2020   GERD (gastroesophageal reflux disease) 09/07/2020   Tobacco abuse 09/07/2020   Malignant neoplasm of unspecified part of unspecified bronchus or lung ([place]) 02/22/2020   Cough 02/12/2020   Rib pain on left side 11/29/2019   Compression fracture of T6 vertebra ([place]) 11/05/2019   Weight  loss 07/29/2019   Age-related osteoporosis without current pathological fracture 05/21/2019   Compression fracture of T5 vertebra ([place]) 05/06/2019   Port-A-Cath in place 05/06/2019    Closed stable burst fracture of fourth lumbar vertebra ([place]) 06/08/2018   Severe protein-calorie malnutrition ([place]) 10/29/2017   Sepsis ([place]) 04/28/2017   Small cell lung cancer, right upper lobe ([place]) 02/23/2017   Goals of care, counseling/discussion 02/23/2017   Lung mass    AAA (abdominal aortic aneurysm) without rupture 11/02/2016   COPD (chronic obstructive pulmonary disease) ([place]) 11/02/2016   Hyperlipidemia 11/02/2016   PAD (peripheral artery disease) ([place]) 11/02/2016     Past Medical History:  Diagnosis Date   AAA (abdominal aortic aneurysm)    Anemia    Breast cancer ([place]) 2002   left   Cardiomyopathy ([place])    a. 09/2020 Echo: EF 25-30%, glob HK, Gr2 DD, mod reduced RV fxn. Mod dil LA. Mild to mod MR. Mod AS.   Chronic combined systolic and diastolic CHF (congestive heart failure) ([place])    a. 09/2020 Echo: EF 25-30%, glob HK, gr2 DD.   COPD (chronic obstructive pulmonary disease) (HCC)    Coronary artery calcification seen on CT scan    Dyspnea    Headache    High cholesterol    History of stress test    a. 10/2016 Low risk MV. EF 60%.   Mitral regurgitation    Moderate aortic stenosis    Personal history of chemotherapy    Small cell lung cancer ([place])    Tobacco abuse      Past Surgical History:  Procedure Laterality Date   ABDOMINAL HYSTERECTOMY     ARTERY BIOPSY Right 12/05/2017   Procedure: BIOPSY TEMPORAL ARTERY;  Surgeon: Margaretha Sheffield, MD;  Location: [facility];  Service: ENT;  Laterality: Right;   ENDOBRONCHIAL ULTRASOUND N/A 02/19/2017   Procedure: ENDOBRONCHIAL ULTRASOUND;  Surgeon: Flora Lipps, MD;  Location: [facility];  Service: Cardiopulmonary;  Laterality: N/A;   ENDOVASCULAR REPAIR/STENT GRAFT N/A 12/06/2016   Procedure: Endovascular Repair/Stent Graft;  Surgeon: Katha Cabal, MD;  Location: [facility];  Service: Cardiovascular;  Laterality: N/A;   HIP ARTHROPLASTY Left 10/26/2020   Procedure: ARTHROPLASTY BIPOLAR HIP (HEMIARTHROPLASTY);   Surgeon: Renee Harder, MD;  Location: [facility];  Service: Orthopedics;  Laterality: Left;   IR FLUORO GUIDE PORT INSERTION RIGHT  03/02/2017   IR REMOVAL TUN ACCESS W/ PORT W/O FL MOD SED  06/14/2021   KYPHOPLASTY N/A 06/11/2018   Procedure: KYPHOPLASTY L4 BIOPSY WITH RFA;  Surgeon: Hessie Knows, MD;  Location: [facility];  Service: Orthopedics;  Laterality: N/A;   KYPHOPLASTY N/A 12/16/2019   Procedure: T6 KYPHOPLASTY;  Surgeon: Hessie Knows, MD;  Location: [facility];  Service: Orthopedics;  Laterality: N/A;   MASTECTOMY Left 2002   with sentinel node     Social History   Socioeconomic History   Marital status: Widowed    Spouse name: Not on file   Number of children: Not on file   Years of education: Not on file   Highest education level: Not on file  Occupational History   Not on file  Tobacco Use   Smoking status: Former    Packs/day: 1.50    Years: 50.00    Pack years: 75.00    Types: Cigarettes   Smokeless tobacco: Never  Vaping Use   Vaping Use: Never used  Substance and Sexual Activity   Alcohol use: No   Drug use: No   Sexual activity:  Never  Other Topics Concern   Not on file  Social History Narrative   Not on file   Social Determinants of Health   Financial Resource Strain: Not on file  Food Insecurity: Not on file  Transportation Needs: Not on file  Physical Activity: Not on file  Stress: Not on file  Social Connections: Not on file  Intimate Partner Violence: Not on file     Family History  Problem Relation Age of Onset   CAD Mother    Colon cancer Brother    Breast cancer Neg Hx      Current Facility-Administered Medications:    acetaminophen (TYLENOL) tablet 650 mg, 650 mg, Oral, Q6H PRN **OR** acetaminophen (TYLENOL) suppository 650 mg, 650 mg, Rectal, Q6H PRN, Howerter, Justin B, DO   albuterol (PROVENTIL) (2.5 MG/3ML) 0.083% nebulizer solution 3 mL, 3 mL, Inhalation, Q4H PRN, Ivor Costa, MD   atorvastatin (LIPITOR) tablet 10 mg, 10 mg,  Oral, QHS, Ivor Costa, MD   dextromethorphan-guaiFENesin (Chaves DM) 30-600 MG per 12 hr tablet 1 tablet, 1 tablet, Oral, BID PRN, Ivor Costa, MD   fentaNYL (SUBLIMAZE) 100 MCG/2ML injection, , , ,    folic acid (FOLVITE) tablet 0.5 mg, 0.5 mg, Oral, Daily, Ivor Costa, MD, 0.5 mg at 07/22/21 1042   heparin lock flush 100 UNIT/ML injection, , , ,    ipratropium (ATROVENT) nebulizer solution 0.6 mg, 3 mL, Nebulization, Q6H, Ivor Costa, MD, 0.6 mg at 07/22/21 1044   loperamide (IMODIUM) capsule 2 mg, 2 mg, Oral, PRN, Ivor Costa, MD   magnesium hydroxide (MILK OF MAGNESIA) suspension 30 mL, 30 mL, Oral, Daily PRN, Ivor Costa, MD   melatonin tablet 10 mg, 10 mg, Oral, QHS, Ivor Costa, MD   midodrine (PROAMATINE) tablet 5 mg, 5 mg, Oral, BID WC, Ivor Costa, MD, 5 mg at 07/22/21 1043   [START ON 07/23/2021] mometasone-formoterol (DULERA) 200-5 MCG/ACT inhaler 2 puff, 2 puff, Inhalation, BID, Ivor Costa, MD   ondansetron (ZOFRAN) injection 4 mg, 4 mg, Intravenous, Q8H PRN, Ivor Costa, MD   pantoprazole (PROTONIX) EC tablet 40 mg, 40 mg, Oral, Daily, Ivor Costa, MD, 40 mg at 07/22/21 1042   rOPINIRole (REQUIP) tablet 0.5 mg, 0.5 mg, Oral, QHS, Ivor Costa, MD   senna-docusate (Senokot-S) tablet 1 tablet, 1 tablet, Oral, QHS, Ivor Costa, MD   traZODone (DESYREL) tablet 50 mg, 50 mg, Oral, QHS, Ivor Costa, MD   vitamin B-12 (CYANOCOBALAMIN) tablet 500 mcg, 500 mcg, Oral, Daily, Ivor Costa, MD, 500 mcg at 07/22/21 1107  Current Outpatient Medications:    acetaminophen (TYLENOL) 500 MG tablet, Take 1,000 mg by mouth every 8 (eight) hours as needed for mild pain., Disp: , Rfl:    aspirin EC 81 MG EC tablet, Take 1 tablet (81 mg total) by mouth daily. Swallow whole., Disp: 30 tablet, Rfl: 11   atorvastatin (LIPITOR) 10 MG tablet, Take 10 mg by mouth at bedtime. , Disp: , Rfl:    Cobalamin Combinations (VITAMIN B12-FOLIC ACID) 801-655 MCG TABS, Take 1 tablet by mouth daily., Disp: , Rfl:     fluticasone-salmeterol (ADVAIR HFA) 115-21 MCG/ACT inhaler, Inhale 2 puffs into the lungs 2 (two) times daily., Disp: , Rfl:    ipratropium (ATROVENT) 0.02 % nebulizer solution, Take 3 mLs by nebulization in the morning, at noon, in the evening, and at bedtime., Disp: , Rfl:    melatonin 3 MG TABS tablet, Take 9 mg by mouth at bedtime., Disp: , Rfl:    midodrine (PROAMATINE) 5  MG tablet, Take 5 mg by mouth 2 (two) times daily with a meal., Disp: , Rfl:    omeprazole (PRILOSEC) 20 MG capsule, Take 1 capsule (20 mg total) by mouth daily., Disp: 30 capsule, Rfl: 1   rOPINIRole (REQUIP) 0.5 MG tablet, Take 0.5 mg by mouth at bedtime., Disp: , Rfl:    senna-docusate (SENOKOT-S) 8.6-50 MG tablet, Take 1 tablet by mouth at bedtime., Disp: , Rfl:    traZODone (DESYREL) 50 MG tablet, Take 1 tablet (50 mg total) by mouth at bedtime., Disp: , Rfl:    albuterol (PROVENTIL HFA;VENTOLIN HFA) 108 (90 Base) MCG/ACT inhaler, Inhale 2 puffs into the lungs every 6 (six) hours as needed for wheezing or shortness of breath., Disp: 1 Inhaler, Rfl: 1   furosemide (LASIX) 20 MG tablet, Take 1 tablet (20 mg total) by mouth every Monday, Wednesday, and Friday., Disp: 30 tablet, Rfl: 0   loperamide (IMODIUM) 2 MG capsule, Take 1 capsule (2 mg total) by mouth as needed for diarrhea or loose stools., Disp: 30 capsule, Rfl: 0   losartan (COZAAR) 25 MG tablet, Take 1 tablet (25 mg total) by mouth daily., Disp: 90 tablet, Rfl: 3   magnesium hydroxide (MILK OF MAGNESIA) 400 MG/5ML suspension, Take 30 mLs by mouth daily as needed for mild constipation., Disp: 355 mL, Rfl: 0   melatonin 5 MG TABS, Take 0.5 tablets (2.5 mg total) by mouth at bedtime. (Patient not taking: Reported on 07/22/2021), Disp: , Rfl: 0   metoprolol succinate (TOPROL XL) 25 MG 24 hr tablet, Take 0.5 tablets (12.5 mg total) by mouth daily., Disp: 45 tablet, Rfl: 3   nicotine (NICODERM CQ - DOSED IN MG/24 HOURS) 14 mg/24hr patch, Place 1 patch (14 mg total) onto the  skin daily. (Patient not taking: Reported on 05/31/2021), Disp: 28 patch, Rfl: 0   vitamin B-12 (CYANOCOBALAMIN) 1000 MCG tablet, Take 1 tablet (1,000 mcg total) by mouth daily. (Patient not taking: Reported on 07/22/2021), Disp: 30 tablet, Rfl: 3  Facility-Administered Medications Ordered in Other Encounters:    sodium chloride flush (NS) 0.9 % injection 10 mL, 10 mL, Intravenous, PRN, Sindy Guadeloupe, MD, 10 mL at 03/26/17 0948   sodium chloride flush (NS) 0.9 % injection 10 mL, 10 mL, Intravenous, PRN, Sindy Guadeloupe, MD, 10 mL at 04/16/17 1006   sodium chloride flush (NS) 0.9 % injection 10 mL, 10 mL, Intravenous, PRN, Sindy Guadeloupe, MD, 10 mL at 01/10/18 1410   sodium chloride flush (NS) 0.9 % injection 10 mL, 10 mL, Intravenous, PRN, Nolon Stalls C, MD, 10 mL at 12/11/18 1033   Physical exam:  Vitals:   07/22/21 1036 07/22/21 1108 07/22/21 1130 07/22/21 1200  BP: (!) 105/57 (!) 104/56 107/60 (!) 104/49  Pulse: 83 84 79 80  Resp: $Remo'18 20 18 19  'RYWdN$ Temp:      TempSrc:      SpO2: 99% 100% 98% 98%  Weight:      Height:       Physical Exam Constitutional:      Comments: Elderly frail woman on 2 L of oxygen.  Appears fatigued  Cardiovascular:     Rate and Rhythm: Normal rate and regular rhythm.     Heart sounds: Normal heart sounds.  Pulmonary:     Effort: Pulmonary effort is normal.     Breath sounds: Normal breath sounds.  Abdominal:     General: Bowel sounds are normal.     Palpations: Abdomen is soft.  Lymphadenopathy:  Comments: No palpable cervical, supraclavicular, axillary or inguinal adenopathy    Skin:    General: Skin is warm and dry.  Neurological:     Mental Status: She is alert and oriented to person, place, and time.       CMP Latest Ref Rng & Units 07/22/2021  Glucose 70 - 99 mg/dL 518(U)  BUN 8 - 23 mg/dL 59(I)  Creatinine 3.96 - 1.00 mg/dL 2.18(V)  Sodium 968 - 279 mmol/L 132(L)  Potassium 3.5 - 5.1 mmol/L 4.1  Chloride 98 - 111 mmol/L 102  CO2 22  - 32 mmol/L 22  Calcium 8.9 - 10.3 mg/dL 7.4(L)  Total Protein 6.5 - 8.1 g/dL 5.1(L)  Total Bilirubin 0.3 - 1.2 mg/dL 9.6(P)  Alkaline Phos 38 - 126 U/L 110  AST 15 - 41 U/L 20  ALT 0 - 44 U/L 14   CBC Latest Ref Rng & Units 07/22/2021  WBC 4.0 - 10.5 K/uL 6.2  Hemoglobin 12.0 - 15.0 g/dL 7.3(O)  Hematocrit 01.3 - 46.0 % 26.7(L)  Platelets 150 - 400 K/uL 24(LL)    @IMAGES @  DG Chest Portable 1 View  Result Date: 07/21/2021 CLINICAL DATA:  Acute hypotension and anemia. EXAM: PORTABLE CHEST 1 VIEW COMPARISON:  Chest radiograph dated 11/21/2020 and CT dated 12/16/2020. FINDINGS: Interval removal of the previously seen Port-A-Cath. Postradiation changes and scarring in the right upper lobe. Interval development of small right pleural effusion. There is diffuse chronic interstitial coarsening. However, slight increase in interstitial markings compared to prior radiograph which may represent edema. Pneumonia isn't excluded. No pneumothorax. Stable cardiomediastinal silhouette. Atherosclerotic calcification of the aorta. Osteopenia with degenerative changes of the spine and midthoracic vertebroplasty. No acute osseous pathology. IMPRESSION: 1. Interval development of small right pleural effusion. 2. Slight increase in the interstitial markings compared to prior radiograph which may represent edema. Pneumonia is not excluded. Electronically Signed   By: 12/18/2020 M.D.   On: 07/21/2021 23:20   ECHOCARDIOGRAM COMPLETE  Result Date: 06/27/2021    ECHOCARDIOGRAM REPORT   Patient Name:   KEANNA TUGWELL Date of Exam: 06/27/2021 Medical Rec #:  06/29/2021         Height:       67.0 in Accession #:    [id number]        Weight:       138.0 lb Date of Birth:  03-Aug-1945         BSA:          1.727 m Patient Age:    75 years          BP:           130/76 mmHg Patient Gender: F                 HR:           84 bpm. Exam Location:  [facility] Procedure: 2D Echo, Color Doppler and Limited Color Doppler  Indications:    I50.20* Unspecified systolic (congestive) heart failure  History:        Patient has prior history of Echocardiogram examinations, most                 recent 10/13/2020. Cardiomyopathy and CHF, AAA, PAD and COPD,                 Aortic Valve Disease; Risk Factors:Dyslipidemia and Former                 Smoker.  Sonographer:  Pilar Jarvis RDMS, RVT, RDCS Referring Phys: [place] Comments: Technically challenging study due to limited acoustic windows. IMPRESSIONS  1. Left ventricular ejection fraction, by estimation, is 45 to 50%. Left ventricular ejection fraction by 2D MOD biplane is 49.4 %. The left ventricle has mildly decreased function. The left ventricle demonstrates global hypokinesis. There is mild left ventricular hypertrophy. Left ventricular diastolic parameters are consistent with Grade I diastolic dysfunction (impaired relaxation).  2. Right ventricular systolic function is normal. The right ventricular size is normal.  3. The mitral valve is degenerative. Mild to moderate mitral valve regurgitation. Severe mitral annular calcification.  4. Tricuspid valve regurgitation is mild to moderate.  5. The aortic valve is calcified. Aortic valve regurgitation is not visualized. Mild aortic valve stenosis. Comparison(s): Previous AV pressure gradients with an LVEF of 25-30%: Max 13 mmHg, mean 35mmHg. FINDINGS  Left Ventricle: Left ventricular ejection fraction, by estimation, is 45 to 50%. Left ventricular ejection fraction by 2D MOD biplane is 49.4 %. The left ventricle has mildly decreased function. The left ventricle demonstrates global hypokinesis. The left ventricular internal cavity size was normal in size. There is mild left ventricular hypertrophy. Left ventricular diastolic parameters are consistent with Grade I diastolic dysfunction (impaired relaxation). Right Ventricle: The right ventricular size is normal. No increase in right ventricular wall thickness.  Right ventricular systolic function is normal. Left Atrium: Left atrial size was normal in size. Right Atrium: Right atrial size was normal in size. Pericardium: There is no evidence of pericardial effusion. Mitral Valve: The mitral valve is degenerative in appearance. Severe mitral annular calcification. Mild to moderate mitral valve regurgitation. Tricuspid Valve: The tricuspid valve is normal in structure. Tricuspid valve regurgitation is mild to moderate. Aortic Valve: The aortic valve is calcified. Aortic valve regurgitation is not visualized. Mild aortic stenosis is present. Aortic valve mean gradient measures 9.5 mmHg. Aortic valve peak gradient measures 18.6 mmHg. Aortic valve area, by VTI measures 1.53 cm. Pulmonic Valve: The pulmonic valve was normal in structure. Pulmonic valve regurgitation is mild. Aorta: The aortic root and ascending aorta are structurally normal, with no evidence of dilitation. Venous: The inferior vena cava was not well visualized. IAS/Shunts: No atrial level shunt detected by color flow Doppler.  LEFT VENTRICLE PLAX 2D                        Biplane EF (MOD) LVIDd:         4.55 cm         LV Biplane EF:   Left LVIDs:         4.00 cm                          ventricular LV PW:         0.90 cm                          ejection LV IVS:        0.90 cm                          fraction by LVOT diam:     2.00 cm                          2D MOD LV SV:  59                               biplane is LV SV Index:   34                               49.4 %. LVOT Area:     3.14 cm                                Diastology                                LV e' medial:    6.96 cm/s LV Volumes (MOD)               LV E/e' medial:  14.0 LV vol d, MOD    70.0 ml       LV e' lateral:   8.59 cm/s A2C:                           LV E/e' lateral: 11.3 LV vol d, MOD    72.3 ml A4C: LV vol s, MOD    35.7 ml A2C: LV vol s, MOD    36.7 ml A4C: LV SV MOD A2C:   34.3 ml LV SV MOD A4C:   72.3 ml LV SV MOD  BP:    36.5 ml RIGHT VENTRICLE             IVC RV Basal diam:  3.10 cm     IVC diam: 1.80 cm RV Mid diam:    2.40 cm RV S prime:     11.60 cm/s TAPSE (M-mode): 2.4 cm LEFT ATRIUM             Index        RIGHT ATRIUM           Index LA diam:        3.00 cm 1.74 cm/m   RA Area:     12.20 cm LA Vol (A2C):   36.5 ml 21.13 ml/m  RA Volume:   24.70 ml  14.30 ml/m LA Vol (A4C):   28.0 ml 16.21 ml/m LA Biplane Vol: 33.3 ml 19.28 ml/m  AORTIC VALVE                     PULMONIC VALVE AV Area (Vmax):    1.72 cm      PV Vmax:          1.26 m/s AV Area (Vmean):   1.51 cm      PV Peak grad:     6.4 mmHg AV Area (VTI):     1.53 cm      PR End Diast Vel: 4.84 msec AV Vmax:           215.50 cm/s AV Vmean:          142.000 cm/s AV VTI:            0.389 m AV Peak Grad:      18.6 mmHg AV Mean Grad:      9.5 mmHg LVOT Vmax:         118.00 cm/s LVOT Vmean:        68.300  cm/s LVOT VTI:          0.189 m LVOT/AV VTI ratio: 0.49  AORTA Ao Root diam: 3.00 cm Ao Asc diam:  2.40 cm Ao Arch diam: 2.4 cm MITRAL VALVE                TRICUSPID VALVE MV Area (PHT): 4.89 cm     TR Peak grad:   38.9 mmHg MV Decel Time: 155 msec     TR Vmax:        312.00 cm/s MV E velocity: 97.40 cm/s MV A velocity: 132.00 cm/s  SHUNTS MV E/A ratio:  0.74         Systemic VTI:  0.19 m                             Systemic Diam: 2.00 cm Kate Sable MD Electronically signed by Kate Sable MD Signature Date/Time: 06/27/2021/5:39:16 PM    Final    CT CHEST ABDOMEN PELVIS WO CONTRAST  Result Date: 07/22/2021 CLINICAL DATA:  Chronic dyspnea. Anemia and thrombocytopenia. History of small cell lung cancer. EXAM: CT CHEST, ABDOMEN AND PELVIS WITHOUT CONTRAST TECHNIQUE: Multidetector CT imaging of the chest, abdomen and pelvis was performed following the standard protocol without IV contrast. RADIATION DOSE REDUCTION: This exam was performed according to the departmental dose-optimization program which includes automated exposure control, adjustment of  the mA and/or kV according to patient size and/or use of iterative reconstruction technique. COMPARISON:  Chest radiograph dated 07/21/2021 and CT dated 12/16/2020. CT abdomen pelvis dated 11/20/2020. FINDINGS: Evaluation of this exam is limited in the absence of intravenous contrast. CT CHEST FINDINGS Cardiovascular: There is no cardiomegaly or pericardial effusion. Coronary vascular calcification of the LAD. There is calcification of the mitral annulus. There is hypoattenuation of the cardiac blood pool suggestive of anemia. Clinical correlation is recommended. Moderate atherosclerotic calcification of the thoracic aorta. No aneurysmal dilatation. The central pulmonary arteries are grossly unremarkable on this noncontrast CT. Mediastinum/Nodes: No hilar or mediastinal adenopathy. Evaluation however is very limited in the absence of contrast and due to post radiation changes of the right upper lobe. The esophagus is grossly unremarkable. No mediastinal fluid collection. Lungs/Pleura: Interval increase in the right pleural effusion since the prior CT with compressive atelectasis of the majority of the right lower lobe. Small left pleural effusion. Post radiation changes in the right upper lobe and perihilar region with architectural distortion, traction bronchiectasis, and volume loss. There is overall interval increase in the right perihilar soft tissue thickening since the prior CT. Recurrent disease is not excluded. PET-CT may provide better evaluation. Biapical subpleural scarring. No pneumothorax. 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 Musculoskeletal: Degenerative changes of the spine and osteopenia. Mild chronic compression fracture of superior endplate of T5. T6 vertebroplasty. No acute osseous pathology. CT ABDOMEN PELVIS FINDINGS No intra-abdominal free air or free fluid. Hepatobiliary: The liver is unremarkable. No intrahepatic biliary ductal  dilatation. The gallbladder is unremarkable. Pancreas: Unremarkable. No pancreatic ductal dilatation or surrounding inflammatory changes. Spleen: Normal in size without focal abnormality. Adrenals/Urinary Tract: The adrenal glands unremarkable. There is no hydronephrosis or nephrolithiasis on either side. The visualized ureters appear unremarkable. The urinary bladder is suboptimally visualized due to streak artifact caused by left hip arthroplasty. Stomach/Bowel: There is sigmoid diverticulosis with muscular hypertrophy. No definite active inflammatory changes. There is no bowel obstruction or active inflammation. The appendix is  normal. Vascular/Lymphatic: An aorto bi iliac endovascular stent graft repair noted. Evaluation is limited in the absence of contrast. The aorta is tortuous. There is moderate atherosclerotic calcification of the aorta. The IVC is grossly unremarkable. No portal venous gas. There is no adenopathy. Reproductive: The uterus is grossly unremarkable. No adnexal masses. Other: None Musculoskeletal: Osteopenia with scoliosis and degenerative changes of the spine. Old L4 compression fracture with anterior wedging and vertebroplasty as seen on the CT of 11/20/2020. Similar appearance of compression fracture of the inferior endplate of L1. No acute osseous pathology. Left hip arthroplasty. IMPRESSION: 1. Interval increase in the right pleural effusion since the prior CT with compressive atelectasis of the majority of the right lower lobe. Small left pleural effusion. 2. Post radiation changes in the right upper lobe and perihilar region. There is overall interval increase in the right perihilar soft tissue thickening since the prior CT. Recurrent disease is not excluded. PET-CT may provide better evaluation. 3. Sigmoid diverticulosis. No bowel obstruction. Normal appendix. 4. Aortic Atherosclerosis (ICD10-I70.0). Electronically Signed   By: Anner Crete M.D.   On: 07/22/2021 00:04     Assessment and plan- Patient is a 76 y.o. female with history of limited stage small cell lung cancer in 2018 s/p concurrent chemoradiation admitted for severe anemia and thrombocytopenia  I saw the patient around midnight and reviewed her peripheral smear as well.  There are occasional fragmented cells noted but no significant schistocytes noted in every high-power field.  Reticulocyte count is normal and LDH is mildly elevated.  Patient is not presenting with any thrombotic symptoms.  Her clinical picture is therefore not consistent with TTP.  I have also discussed her peripheral smear findings with Dr. Reuel Derby who reviewed her smear this morning.  Anemia work-up was otherwise unremarkable.  Although there were no blasts noted in her peripheral smear I will be sending a peripheral flow cytometry and I did speak to interventional radiology this morning to get a bone marrow biopsy done while she is inpatient.  Given that she has received etoposide based chemotherapy in the past, we could be dealing with therapy-related acute leukemia or MDS which we will know from bone marrow biopsy results.Patient has already received 3 units of PRBC transfusion since overnight and her hemoglobin has improved to 8.9.  Platelets remain in the 20s.  No active bleeding.  Okay to transfuse 1 unit of platelets prior to discharge  Would recommend monitoring the patient for the next 24 to 48 hours and if her counts remain stable she can be discharged and I will follow-up with her as an outpatient to discuss bone marrow biopsy results and further management.  At baseline patient's health is poor and her performance status is 3-4.  She is a DNR and does not desire any active chemotherapy as such even if this were to be a leukemia.  I did have palliative care see her while she is inpatient and we will continue to have goals of care conversation with her as an outpatient   Total face to face encounter time for this patient visit  was 50 min.  Total non face to face encounter time for this patient on the day of the visit was 20 min. Time spent in reviewing outside records as well as discussing patients case with Dr. Reuel Derby, Dr. Dwaine Gale and coordinating her overall care   Visit Diagnosis 1. Acute anemia   2. Anemia   3. Thrombocytopenia ([place])     Dr. Randa Evens, MD,  [facility] [place] [facility] [phone] 07/22/2021

## 2021-07-22 NOTE — Progress Notes (Signed)
Full note to follow. Pancytopenia concerning for mds or acute leukemia. Hopefully marrow today. Smear and clinical picture not consistent with TTP. No need for transfer. Will involve palliative care for goals of care discussion as well  Dr. Randa Evens, MD, MPH [facility] at [facility] Pager- [phone] 07/22/2021 8:49 AM

## 2021-07-23 DIAGNOSIS — J449 Chronic obstructive pulmonary disease, unspecified: Secondary | ICD-10-CM

## 2021-07-23 DIAGNOSIS — N179 Acute kidney failure, unspecified: Secondary | ICD-10-CM

## 2021-07-23 DIAGNOSIS — I5042 Chronic combined systolic (congestive) and diastolic (congestive) heart failure: Secondary | ICD-10-CM

## 2021-07-23 DIAGNOSIS — D61818 Other pancytopenia: Secondary | ICD-10-CM | POA: Diagnosis present

## 2021-07-23 DIAGNOSIS — E871 Hypo-osmolality and hyponatremia: Secondary | ICD-10-CM

## 2021-07-23 LAB — CBC
HCT: 25.9 % — ABNORMAL LOW (ref 36.0–46.0)
Hemoglobin: 8.7 g/dL — ABNORMAL LOW (ref 12.0–15.0)
MCH: 32.1 pg (ref 26.0–34.0)
MCHC: 33.6 g/dL (ref 30.0–36.0)
MCV: 95.6 fL (ref 80.0–100.0)
Platelets: 16 10*3/uL — CL (ref 150–400)
RBC: 2.71 MIL/uL — ABNORMAL LOW (ref 3.87–5.11)
RDW: 18.4 % — ABNORMAL HIGH (ref 11.5–15.5)
WBC: 4.3 10*3/uL (ref 4.0–10.5)
nRBC: 1.2 % — ABNORMAL HIGH (ref 0.0–0.2)

## 2021-07-23 LAB — BPAM PLATELET PHERESIS
Blood Product Expiration Date: 202302242359
Unit Type and Rh: 6200

## 2021-07-23 LAB — BASIC METABOLIC PANEL
Anion gap: 8 (ref 5–15)
BUN: 49 mg/dL — ABNORMAL HIGH (ref 8–23)
CO2: 23 mmol/L (ref 22–32)
Calcium: 7.7 mg/dL — ABNORMAL LOW (ref 8.9–10.3)
Chloride: 101 mmol/L (ref 98–111)
Creatinine, Ser: 1.09 mg/dL — ABNORMAL HIGH (ref 0.44–1.00)
GFR, Estimated: 53 mL/min — ABNORMAL LOW (ref >60)
Glucose, Bld: 100 mg/dL — ABNORMAL HIGH (ref 70–99)
Potassium: 3.9 mmol/L (ref 3.5–5.1)
Sodium: 132 mmol/L — ABNORMAL LOW (ref 135–145)

## 2021-07-23 LAB — PREPARE PLATELET PHERESIS: Unit division: 0

## 2021-07-23 LAB — HAPTOGLOBIN: Haptoglobin: 124 mg/dL (ref 42–346)

## 2021-07-23 MED ORDER — FOLIC ACID 1 MG PO TABS: 0.5000 mg | ORAL_TABLET | Freq: Every day | ORAL | Status: AC

## 2021-07-23 MED ORDER — ENSURE ENLIVE PO LIQD
237.0000 mL | Freq: Two times a day (BID) | ORAL | Status: DC
  Administered 2021-07-23 (×2): 237 mL via ORAL

## 2021-07-23 MED ORDER — ADULT MULTIVITAMIN W/MINERALS CH
1.0000 | ORAL_TABLET | Freq: Every day | ORAL | Status: DC
  Administered 2021-07-23: 1 via ORAL
  Filled 2021-07-23: qty 1

## 2021-07-23 MED ORDER — SODIUM CHLORIDE 0.9% IV SOLUTION
Freq: Once | INTRAVENOUS | Status: AC
  Filled 2021-07-23: qty 250

## 2021-07-23 NOTE — Progress Notes (Addendum)
Initial Nutrition Assessment  DOCUMENTATION CODES:   Not applicable  INTERVENTION:   -Ensure Enlive po BID, each supplement provides 350 kcal and 20 grams of protein -MVI with minerals daily -Liberalize diet to regular to provide widest variety of meal selections  NUTRITION DIAGNOSIS:   Increased nutrient needs related to chronic illness (lung cancer) as evidenced by estimated needs.  GOAL:   Patient will meet greater than or equal to 90% of their needs  MONITOR:   PO intake, Supplement acceptance, Diet advancement, Labs, Weight trends, Skin, I & O's  REASON FOR ASSESSMENT:   Consult Assessment of nutrition requirement/status  ASSESSMENT:   Roberta [name] is a 76 y.o. female with medical history significant of lung cancer, breast cancer, hypertension, hyperlipidemia, COPD on 2 L oxygen, GERD, depression, tobacco abuse, mitral valve regurgitation, aortic valve stenosis, AAA, anemia, CHF with EF of 45-50%, who presents with weakness and fatigue.  Pt admitted with symptomatic anemia and thrombocytopenia.   Reviewed I/O's: +280 ml x 24 hours and +1.6 L since admission  Pt unavailable at time of visit. RD unable to obtain further nutrition-related history or complete nutrition-focused physical exam at this time.     Pt currently on a heart healthy diet. No meal completions data available to assess at this time. RD will liberalize diet due to advanced age, multiple co-morbidities, and to offer widest variety of meal completions.  Reviewed wt hx; wt has been stable over the past year.   Per palliative care notes, work-up concerning for possible hematological malignancy. Plan further goals of care discussions once results of bone marrow biopsy are finalized. Pt's family may be leaning towards comfort care.   Medications reviewed and include melatonin, senokot, and vitamin B-12.   Labs reviewed: Na: 132.    Diet Order:   Diet Order             Diet Heart Room service  appropriate? Yes; Fluid consistency: Thin  Diet effective now                   EDUCATION NEEDS:   No education needs have been identified at this time  Skin:  Skin Assessment: Reviewed RN Assessment  Last BM:  Unknown  Height:   Ht Readings from Last 1 Encounters:  07/21/21 _0  (1.702 m)    Weight:   Wt Readings from Last 1 Encounters:  07/21/21 65 kg    Ideal Body Weight:  61.4 kg  BMI:  Body mass index is 22.44 kg/m.  Estimated Nutritional Needs:   Kcal:  1650-1850  Protein:  85-100 grams  Fluid:  > 1.6 L    Loistine Chance, RD, LDN, [place] Registered Dietitian II Certified Diabetes Care and Education Specialist Please refer to [facility] for RD and/or RD on-call/weekend/after hours pager

## 2021-07-23 NOTE — Hospital Course (Signed)
76 y.o. F with COPD, chronic respiratory failure on 2L home O2, HTN, hx AV stenosis and MV regurg, AAA status postrepair, anemia, CHF with EF of 45-50% and history of breast cancer and small cell lung cancer currently in remission.  Patient presented to the ED on 07/21/2021 with worsening fatigue and shortness of breath for several days and was found to have severe symptomatic anemia and thrombocytopenia.  2/24: Underwent bone marrow biopsy, Oncology consulted.

## 2021-07-23 NOTE — Evaluation (Signed)
Physical Therapy Evaluation Patient Details Name: Roberta [name] MRN: [id number] DOB: [birth year] Today's Date: 07/23/2021  History of Present Illness  Pt is a 76 y/o F admitted on 07/21/21 after presenting with c/o weakness, fatigue & SOB. Pt was initially hypotensive with low Hgb & pt received blood transfusion. Etiology unclear for symptomatic anemia & thrombocytopenia. Hematology was consulted & bone marrow biopsy was performed. PMH: lung CA, breast CA, HTN, HLD, COPD on 2L O2, GERD, depression, tobacco abuse, MVR, aortic valve stenosis, AAA, anemia, CHF  Clinical Impression  Pt seen for PT evaluation with pt reporting prior to admission she was residing at [facility], where she moved about 6 months ago. Pt reports she was ambulatory with a RW. On this date, pt requires min assist for bed mobility with bed rails & HOB elevated and as little as supervision for static sitting EOB but does not tolerate much mobility at all 2/2 fatigue & feeling "weak". Pt sits EOB <10 seconds & looks visibly exhausted. Pt does engage in BLE strengthening exercises with multimodal cuing for technique. Will continue to follow pt acutely to progress gait, endurance, transfers, & strengthening as able. Pt would benefit from STR upon d/c to maximize independence with functional mobility & reduce fall risk.      Recommendations for follow up therapy are one component of a multi-disciplinary discharge planning process, led by the attending physician.  Recommendations may be updated based on patient status, additional functional criteria and insurance authorization.  Follow Up Recommendations Skilled nursing-short term rehab (<3 hours/day)    Assistance Recommended at Discharge Frequent or constant Supervision/Assistance  Patient can return home with the following  A lot of help with walking and/or transfers;A lot of help with bathing/dressing/bathroom;Assistance with cooking/housework;Direct supervision/assist for  medications management    Equipment Recommendations None recommended by PT  Recommendations for Other Services       Functional Status Assessment Patient has had a recent decline in their functional status and demonstrates the ability to make significant improvements in function in a reasonable and predictable amount of time.     Precautions / Restrictions Precautions Precautions: Fall Restrictions Weight Bearing Restrictions: No      Mobility  Bed Mobility Overal bed mobility: Needs Assistance Bed Mobility: Supine to Sit, Sit to Supine, Rolling Rolling: Supervision (use of bed rails)   Supine to sit: Min assist, HOB elevated Sit to supine: Supervision, HOB elevated   General bed mobility comments: max assist to scoot to [place] with bed in trendelenburg position    Transfers                        Ambulation/Gait                  Stairs            Wheelchair Mobility    Modified Rankin (Stroke Patients Only)       Balance Overall balance assessment: Needs assistance Sitting-balance support: Feet unsupported, Bilateral upper extremity supported Sitting balance-Leahy Scale: Fair Sitting balance - Comments: Pt is able to sit at EOB briefly (<10 seconds) with close supervision. Pt with poor endurance & lies down 2/2 fatigue & feeling "weak".                                     Pertinent Vitals/Pain Pain Assessment Pain Assessment: No/denies pain    Home  Living Family/patient expects to be discharged to:: Skilled nursing facility                   Additional Comments: Pt reports she was living with her daughter & granddaughter but recently moved to [place] ~6 months ago.    Prior Function               Mobility Comments: Pt reports she was ambulatory with RW and would go outside to smoke.       Hand Dominance        Extremity/Trunk Assessment   Upper Extremity Assessment Upper Extremity Assessment:  Generalized weakness    Lower Extremity Assessment Lower Extremity Assessment: Generalized weakness    Cervical / Trunk Assessment Cervical / Trunk Assessment: Kyphotic (rounded shoulders, forward head)  Communication   Communication: HOH  Cognition Arousal/Alertness: Awake/alert Behavior During Therapy: WFL for tasks assessed/performed Overall Cognitive Status: Within Functional Limits for tasks assessed                                          General Comments      Exercises General Exercises - Lower Extremity Heel Slides: AAROM, Strengthening, Both, 10 reps, Supine Hip ABduction/ADduction: AROM, Strengthening, Both, 10 reps, Supine (hip adduction pillow squeezes) Straight Leg Raises: AAROM, Strengthening, Both, 10 reps, Supine   Assessment/Plan    PT Assessment Patient needs continued PT services  PT Problem List Decreased strength;Decreased coordination;Cardiopulmonary status limiting activity;Decreased activity tolerance;Decreased balance;Decreased mobility;Decreased safety awareness;Decreased knowledge of use of DME       PT Treatment Interventions DME instruction;Therapeutic exercise;Gait training;Balance training;Neuromuscular re-education;Functional mobility training;Therapeutic activities;Patient/family education    PT Goals (Current goals can be found in the Care Plan section)  Acute Rehab PT Goals Patient Stated Goal: get better PT Goal Formulation: With patient Time For Goal Achievement: 08/06/21 Potential to Achieve Goals: Fair    Frequency Min 2X/week     Co-evaluation               AM-PAC PT "6 Clicks" Mobility  Outcome Measure Help needed turning from your back to your side while in a flat bed without using bedrails?: A Little Help needed moving from lying on your back to sitting on the side of a flat bed without using bedrails?: A Lot Help needed moving to and from a bed to a chair (including a wheelchair)?: A Lot Help  needed standing up from a chair using your arms (e.g., wheelchair or bedside chair)?: A Lot Help needed to walk in hospital room?: A Lot Help needed climbing 3-5 steps with a railing? : Total 6 Click Score: 12    End of Session   Activity Tolerance: Patient limited by fatigue Patient left: in bed;with call bell/phone within reach Nurse Communication: Mobility status PT Visit Diagnosis: Difficulty in walking, not elsewhere classified (R26.2);Muscle weakness (generalized) (M62.81)    Time: 8638-1771 PT Time Calculation (min) (ACUTE ONLY): 13 min   Charges:   PT Evaluation $PT Eval Moderate Complexity: [place], PT, DPT 07/23/21, 2:36 PM   Waunita Schooner 07/23/2021, 2:34 PM

## 2021-07-23 NOTE — ED Notes (Signed)
Pt sleeping at this time. NAD noted.

## 2021-07-23 NOTE — TOC Transition Note (Signed)
Transition of Care [facility]) - CM/SW Discharge Note   Patient Details  Name: Roberta [name] MRN: [id number] Date of Birth: 1946-03-23  Transition of Care [facility]) CM/SW Contact:  Janyth Contes, [place] Phone Number: 07/23/2021, 3:10 PM   Clinical Narrative:     CSW spoke to [place] at [place] [phone]) letting her know the patient is ready for discharge. [name] confirmed and reported the patient will be in [place] in room 12B at [facility]. The patient's nurse in the facility will be [place].   CSW arranged transport with [facility]. No further TOC needs.          Patient Goals and CMS Choice        Discharge Placement                       Discharge Plan and Services                                     Social Determinants of Health (SDOH) Interventions     Readmission Risk Interventions Readmission Risk Prevention Plan 11/23/2020 10/25/2020  Transportation Screening Complete Complete  PCP or Specialist Appt within 5-7 Days - Complete  Home Care Screening - Complete  Medication Review (RN CM) - Complete  Medication Review [employer]) Complete -  PCP or Specialist appointment within 3-5 days of discharge Complete -  [place] or Home Care Consult Complete -  SW Recovery Care/Counseling Consult Not Complete -  SW Consult Not Complete Comments RNCM assigned to case -  Palliative Care Screening Not Applicable -  Comments n/a -  Skilled Nursing Facility Complete -  Some recent data might be hidden

## 2021-07-23 NOTE — Discharge Summary (Signed)
Physician Discharge Summary   Patient: Roberta [name] MRN: [id number] DOB: January 18, 1946  Admit date:     07/21/2021  Discharge date: 07/23/21  Discharge Physician: Edwin Dada   PCP: Gayland Curry, MD   Recommendations at discharge:  Follow up with Oncology Dr. Janese Banks next week Compass: Please schedule follow up appointment with Dr. Janese Banks next Tuesday or Wednesday Compass: Any reasonable fall precautions are recommended, in particular, low bed, assistance with mobility and/or wheelchair.       Discharge Diagnoses: Principal Problem:   Pancytopenia ([place]) Active Problems:   AKI (acute kidney injury) ([place])   COPD (chronic obstructive pulmonary disease) (HCC)   Hyperlipidemia   Small cell lung cancer, right upper lobe (HCC)   Chronic combined systolic and diastolic heart failure (HCC)   HTN (hypertension)   Depression   Hypotension   Hyponatremia       Hospital Course: 76 y.o. F with COPD, chronic respiratory failure on 2L home O2, HTN, hx AV stenosis and MV regurg, AAA status postrepair, anemia, CHF with EF of 45-50% and history of breast cancer and small cell lung cancer currently in remission.  Patient presented to the ED on 07/21/2021 with worsening fatigue and shortness of breath for several days and was found to have severe symptomatic anemia and thrombocytopenia.  Transfused 3 units PRBCs and post-transfusion H/H improved >8 g/dL.  Oncology and Palliative Care were consulted.  Aspirin stopped.  Smear ruled out TTP.  The patient underwent Bone marrow biopsy, which is pending and likely will return next week.    She was transfused 1 unit platelets. Oncology suspect leukemia or MDS.  Risks and benefits of treatment were discussed with patient, and in joint decision making, it was agreed that chemotherapy would likely be more harmful than helpful if the results of BMB confirm leukemia.    Hematology recommended close follow up, no further inpatietn treatments  needed, PT and fall precautions.         Pain control - [employer] Controlled Substance Reporting System database was reviewed.   Consultants: Hematology Palliative Care   Procedures performed: Bone marrow biopsy  Disposition: Skilled nursing facility     DISCHARGE MEDICATION: Allergies as of 07/23/2021   No Known Allergies      Medication List     STOP taking these medications    aspirin 81 MG EC tablet       TAKE these medications    acetaminophen 500 MG tablet Commonly known as: TYLENOL Take 1,000 mg by mouth every 8 (eight) hours as needed for mild pain.   albuterol 108 (90 Base) MCG/ACT inhaler Commonly known as: VENTOLIN HFA Inhale 2 puffs into the lungs every 6 (six) hours as needed for wheezing or shortness of breath.   atorvastatin 10 MG tablet Commonly known as: LIPITOR Take 10 mg by mouth at bedtime.   fluticasone-salmeterol 115-21 MCG/ACT inhaler Commonly known as: ADVAIR HFA Inhale 2 puffs into the lungs 2 (two) times daily.   folic acid 1 MG tablet Commonly known as: FOLVITE Take 0.5 tablets (0.5 mg total) by mouth daily. Start taking on: July 24, 2021   furosemide 20 MG tablet Commonly known as: LASIX Take 1 tablet (20 mg total) by mouth every Monday, Wednesday, and Friday.   ipratropium 0.02 % nebulizer solution Commonly known as: ATROVENT Take 3 mLs by nebulization in the morning, at noon, in the evening, and at bedtime.   loperamide 2 MG capsule Commonly known as: IMODIUM Take 1  capsule (2 mg total) by mouth as needed for diarrhea or loose stools.   losartan 25 MG tablet Commonly known as: COZAAR Take 1 tablet (25 mg total) by mouth daily.   magnesium hydroxide 400 MG/5ML suspension Commonly known as: MILK OF MAGNESIA Take 30 mLs by mouth daily as needed for mild constipation.   melatonin 3 MG Tabs tablet Take 9 mg by mouth at bedtime.   melatonin 5 MG Tabs Take 0.5 tablets (2.5 mg total) by mouth at  bedtime.   metoprolol succinate 25 MG 24 hr tablet Commonly known as: Toprol XL Take 0.5 tablets (12.5 mg total) by mouth daily.   midodrine 5 MG tablet Commonly known as: PROAMATINE Take 5 mg by mouth 2 (two) times daily with a meal.   nicotine 14 mg/24hr patch Commonly known as: NICODERM CQ - dosed in mg/24 hours Place 1 patch (14 mg total) onto the skin daily.   omeprazole 20 MG capsule Commonly known as: PRILOSEC Take 1 capsule (20 mg total) by mouth daily.   rOPINIRole 0.5 MG tablet Commonly known as: REQUIP Take 0.5 mg by mouth at bedtime.   senna-docusate 8.6-50 MG tablet Commonly known as: Senokot-S Take 1 tablet by mouth at bedtime.   traZODone 50 MG tablet Commonly known as: DESYREL Take 1 tablet (50 mg total) by mouth at bedtime.   vitamin B-12 1000 MCG tablet Commonly known as: CYANOCOBALAMIN Take 1 tablet (1,000 mcg total) by mouth daily.   Vitamin B12-Folic Acid 270-786 MCG Tabs Take 1 tablet by mouth daily.          Discharge Exam: Filed Weights   07/21/21 2215  Weight: 65 kg   General: Pt is alert, awake, not in acute distress Skin/HEENT: No pallor.  Scattered purpura.  No epistaxis or gums bleeding Cardiovascular: RRR, nl S1-S2, no murmurs appreciated.   No LE edema.   Respiratory: Normal respiratory rate and rhythm.  CTAB without rales or wheezes. Abdominal: Abdomen soft and non-tender.  No distension or HSM.   Neuro/Psych: Strength symmetric in upper and lower extremities.  Judgment and insight appear mildly impaired.   Condition at discharge: stable  The results of significant diagnostics from this hospitalization (including imaging, microbiology, ancillary and laboratory) are listed below for reference.   Imaging Studies: DG Chest Portable 1 View  Result Date: 07/21/2021 CLINICAL DATA:  Acute hypotension and anemia. EXAM: PORTABLE CHEST 1 VIEW COMPARISON:  Chest radiograph dated 11/21/2020 and CT dated 12/16/2020. FINDINGS: Interval  removal of the previously seen Port-A-Cath. Postradiation changes and scarring in the right upper lobe. Interval development of small right pleural effusion. There is diffuse chronic interstitial coarsening. However, slight increase in interstitial markings compared to prior radiograph which may represent edema. Pneumonia isn't excluded. No pneumothorax. Stable cardiomediastinal silhouette. Atherosclerotic calcification of the aorta. Osteopenia with degenerative changes of the spine and midthoracic vertebroplasty. No acute osseous pathology. IMPRESSION: 1. Interval development of small right pleural effusion. 2. Slight increase in the interstitial markings compared to prior radiograph which may represent edema. Pneumonia is not excluded. Electronically Signed   By: Anner Crete M.D.   On: 07/21/2021 23:20   CT BONE MARROW BIOPSY  Result Date: 07/22/2021 INDICATION: Anemia EXAM: CT GUIDED BONE MARROW ASPIRATES AND BIOPSY MEDICATIONS: Fentanyl 25 mg IV ANESTHESIA/SEDATION: None COMPLICATIONS: None immediate. PROCEDURE: The procedure was explained to the patient. The risks and benefits of the procedure were discussed and the patient's questions were addressed. Informed consent was obtained from the patient. The patient was  placed prone on CT table. Images of the pelvis were obtained. The right side of back was prepped and draped in sterile fashion. The skin and right posterior ilium were anesthetized with 1% lidocaine. 11 gauge bone needle was directed into the right ilium with CT guidance. Two aspirates and 1 core biopsy were obtained. Bandage placed over the puncture site. IMPRESSION: CT guided bone marrow aspiration and core biopsy. Electronically Signed   By: Miachel Roux M.D.   On: 07/22/2021 14:19   ECHOCARDIOGRAM COMPLETE  Result Date: 06/27/2021    ECHOCARDIOGRAM REPORT   Patient Name:   EMILEY DIGIACOMO Date of Exam: 06/27/2021 Medical Rec #:  [id number]         Height:       67.0 in Accession #:     [id number]        Weight:       138.0 lb Date of Birth:  [birth year]         BSA:          1.727 m Patient Age:    57 years          BP:           130/76 mmHg Patient Gender: F                 HR:           84 bpm. Exam Location:  [facility] Procedure: 2D Echo, Color Doppler and Limited Color Doppler Indications:    I50.20* Unspecified systolic (congestive) heart failure  History:        Patient has prior history of Echocardiogram examinations, most                 recent 10/13/2020. Cardiomyopathy and CHF, AAA, PAD and COPD,                 Aortic Valve Disease; Risk Factors:Dyslipidemia and Former                 Smoker.  Sonographer:    Pilar Jarvis RDMS, RVT, RDCS Referring Phys: [place] Comments: Technically challenging study due to limited acoustic windows. IMPRESSIONS  1. Left ventricular ejection fraction, by estimation, is 45 to 50%. Left ventricular ejection fraction by 2D MOD biplane is 49.4 %. The left ventricle has mildly decreased function. The left ventricle demonstrates global hypokinesis. There is mild left ventricular hypertrophy. Left ventricular diastolic parameters are consistent with Grade I diastolic dysfunction (impaired relaxation).  2. Right ventricular systolic function is normal. The right ventricular size is normal.  3. The mitral valve is degenerative. Mild to moderate mitral valve regurgitation. Severe mitral annular calcification.  4. Tricuspid valve regurgitation is mild to moderate.  5. The aortic valve is calcified. Aortic valve regurgitation is not visualized. Mild aortic valve stenosis. Comparison(s): Previous AV pressure gradients with an LVEF of 25-30%: Max 13 mmHg, mean 89mHg. FINDINGS  Left Ventricle: Left ventricular ejection fraction, by estimation, is 45 to 50%. Left ventricular ejection fraction by 2D MOD biplane is 49.4 %. The left ventricle has mildly decreased function. The left ventricle demonstrates global hypokinesis. The left ventricular  internal cavity size was normal in size. There is mild left ventricular hypertrophy. Left ventricular diastolic parameters are consistent with Grade I diastolic dysfunction (impaired relaxation). Right Ventricle: The right ventricular size is normal. No increase in right ventricular wall thickness. Right ventricular systolic function is normal. Left Atrium: Left atrial size was normal in size. Right  Atrium: Right atrial size was normal in size. Pericardium: There is no evidence of pericardial effusion. Mitral Valve: The mitral valve is degenerative in appearance. Severe mitral annular calcification. Mild to moderate mitral valve regurgitation. Tricuspid Valve: The tricuspid valve is normal in structure. Tricuspid valve regurgitation is mild to moderate. Aortic Valve: The aortic valve is calcified. Aortic valve regurgitation is not visualized. Mild aortic stenosis is present. Aortic valve mean gradient measures 9.5 mmHg. Aortic valve peak gradient measures 18.6 mmHg. Aortic valve area, by VTI measures 1.53 cm. Pulmonic Valve: The pulmonic valve was normal in structure. Pulmonic valve regurgitation is mild. Aorta: The aortic root and ascending aorta are structurally normal, with no evidence of dilitation. Venous: The inferior vena cava was not well visualized. IAS/Shunts: No atrial level shunt detected by color flow Doppler.  LEFT VENTRICLE PLAX 2D                        Biplane EF (MOD) LVIDd:         4.55 cm         LV Biplane EF:   Left LVIDs:         4.00 cm                          ventricular LV PW:         0.90 cm                          ejection LV IVS:        0.90 cm                          fraction by LVOT diam:     2.00 cm                          2D MOD LV SV:         59                               biplane is LV SV Index:   34                               49.4 %. LVOT Area:     3.14 cm                                Diastology                                LV e' medial:    6.96 cm/s LV Volumes  (MOD)               LV E/e' medial:  14.0 LV vol d, MOD    70.0 ml       LV e' lateral:   8.59 cm/s A2C:                           LV E/e' lateral: 11.3 LV vol d, MOD    72.3 ml A4C: LV vol s, MOD    35.7 ml A2C: LV  vol s, MOD    36.7 ml A4C: LV SV MOD A2C:   34.3 ml LV SV MOD A4C:   72.3 ml LV SV MOD BP:    36.5 ml RIGHT VENTRICLE             IVC RV Basal diam:  3.10 cm     IVC diam: 1.80 cm RV Mid diam:    2.40 cm RV S prime:     11.60 cm/s TAPSE (M-mode): 2.4 cm LEFT ATRIUM             Index        RIGHT ATRIUM           Index LA diam:        3.00 cm 1.74 cm/m   RA Area:     12.20 cm LA Vol (A2C):   36.5 ml 21.13 ml/m  RA Volume:   24.70 ml  14.30 ml/m LA Vol (A4C):   28.0 ml 16.21 ml/m LA Biplane Vol: 33.3 ml 19.28 ml/m  AORTIC VALVE                     PULMONIC VALVE AV Area (Vmax):    1.72 cm      PV Vmax:          1.26 m/s AV Area (Vmean):   1.51 cm      PV Peak grad:     6.4 mmHg AV Area (VTI):     1.53 cm      PR End Diast Vel: 4.84 msec AV Vmax:           215.50 cm/s AV Vmean:          142.000 cm/s AV VTI:            0.389 m AV Peak Grad:      18.6 mmHg AV Mean Grad:      9.5 mmHg LVOT Vmax:         118.00 cm/s LVOT Vmean:        68.300 cm/s LVOT VTI:          0.189 m LVOT/AV VTI ratio: 0.49  AORTA Ao Root diam: 3.00 cm Ao Asc diam:  2.40 cm Ao Arch diam: 2.4 cm MITRAL VALVE                TRICUSPID VALVE MV Area (PHT): 4.89 cm     TR Peak grad:   38.9 mmHg MV Decel Time: 155 msec     TR Vmax:        312.00 cm/s MV E velocity: 97.40 cm/s MV A velocity: 132.00 cm/s  SHUNTS MV E/A ratio:  0.74         Systemic VTI:  0.19 m                             Systemic Diam: 2.00 cm Kate Sable MD Electronically signed by Kate Sable MD Signature Date/Time: 06/27/2021/5:39:16 PM    Final    CT CHEST ABDOMEN PELVIS WO CONTRAST  Result Date: 07/22/2021 CLINICAL DATA:  Chronic dyspnea. Anemia and thrombocytopenia. History of small cell lung cancer. EXAM: CT CHEST, ABDOMEN AND PELVIS WITHOUT  CONTRAST TECHNIQUE: Multidetector CT imaging of the chest, abdomen and pelvis was performed following the standard protocol without IV contrast. RADIATION DOSE REDUCTION: This exam was performed according to the departmental dose-optimization program which includes automated exposure control, adjustment of the mA  and/or kV according to patient size and/or use of iterative reconstruction technique. COMPARISON:  Chest radiograph dated 07/21/2021 and CT dated 12/16/2020. CT abdomen pelvis dated 11/20/2020. FINDINGS: Evaluation of this exam is limited in the absence of intravenous contrast. CT CHEST FINDINGS Cardiovascular: There is no cardiomegaly or pericardial effusion. Coronary vascular calcification of the LAD. There is calcification of the mitral annulus. There is hypoattenuation of the cardiac blood pool suggestive of anemia. Clinical correlation is recommended. Moderate atherosclerotic calcification of the thoracic aorta. No aneurysmal dilatation. The central pulmonary arteries are grossly unremarkable on this noncontrast CT. Mediastinum/Nodes: No hilar or mediastinal adenopathy. Evaluation however is very limited in the absence of contrast and due to post radiation changes of the right upper lobe. The esophagus is grossly unremarkable. No mediastinal fluid collection. Lungs/Pleura: Interval increase in the right pleural effusion since the prior CT with compressive atelectasis of the majority of the right lower lobe. Small left pleural effusion. Post radiation changes in the right upper lobe and perihilar region with architectural distortion, traction bronchiectasis, and volume loss. There is overall interval increase in the right perihilar soft tissue thickening since the prior CT. Recurrent disease is not excluded. PET-CT may provide better evaluation. Biapical subpleural scarring. No pneumothorax. 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  Musculoskeletal: Degenerative changes of the spine and osteopenia. Mild chronic compression fracture of superior endplate of T5. T6 vertebroplasty. No acute osseous pathology. CT ABDOMEN PELVIS FINDINGS No intra-abdominal free air or free fluid. Hepatobiliary: The liver is unremarkable. No intrahepatic biliary ductal dilatation. The gallbladder is unremarkable. Pancreas: Unremarkable. No pancreatic ductal dilatation or surrounding inflammatory changes. Spleen: Normal in size without focal abnormality. Adrenals/Urinary Tract: The adrenal glands unremarkable. There is no hydronephrosis or nephrolithiasis on either side. The visualized ureters appear unremarkable. The urinary bladder is suboptimally visualized due to streak artifact caused by left hip arthroplasty. Stomach/Bowel: There is sigmoid diverticulosis with muscular hypertrophy. No definite active inflammatory changes. There is no bowel obstruction or active inflammation. The appendix is normal. Vascular/Lymphatic: An aorto bi iliac endovascular stent graft repair noted. Evaluation is limited in the absence of contrast. The aorta is tortuous. There is moderate atherosclerotic calcification of the aorta. The IVC is grossly unremarkable. No portal venous gas. There is no adenopathy. Reproductive: The uterus is grossly unremarkable. No adnexal masses. Other: None Musculoskeletal: Osteopenia with scoliosis and degenerative changes of the spine. Old L4 compression fracture with anterior wedging and vertebroplasty as seen on the CT of 11/20/2020. Similar appearance of compression fracture of the inferior endplate of L1. No acute osseous pathology. Left hip arthroplasty. IMPRESSION: 1. Interval increase in the right pleural effusion since the prior CT with compressive atelectasis of the majority of the right lower lobe. Small left pleural effusion. 2. Post radiation changes in the right upper lobe and perihilar region. There is overall interval increase in the right  perihilar soft tissue thickening since the prior CT. Recurrent disease is not excluded. PET-CT may provide better evaluation. 3. Sigmoid diverticulosis. No bowel obstruction. Normal appendix. 4. Aortic Atherosclerosis (ICD10-I70.0). Electronically Signed   By: Anner Crete M.D.   On: 07/22/2021 00:04    Microbiology: Results for orders placed or performed during the hospital encounter of 07/21/21  Resp Panel by RT-PCR (Flu A&B, Covid) Nasopharyngeal Swab     Status: None   Collection Time: 07/22/21  3:27 AM   Specimen: Nasopharyngeal Swab; Nasopharyngeal(NP) swabs in vial transport medium  Result Value Ref  Range Status   SARS Coronavirus 2 by RT PCR NEGATIVE NEGATIVE Final    Comment: (NOTE) SARS-CoV-2 target nucleic acids are NOT DETECTED.  The SARS-CoV-2 RNA is generally detectable in upper respiratory specimens during the acute phase of infection. The lowest concentration of SARS-CoV-2 viral copies this assay can detect is 138 copies/mL. A negative result does not preclude SARS-Cov-2 infection and should not be used as the sole basis for treatment or other patient management decisions. A negative result may occur with  improper specimen collection/handling, submission of specimen other than nasopharyngeal swab, presence of viral mutation(s) within the areas targeted by this assay, and inadequate number of viral copies(<138 copies/mL). A negative result must be combined with clinical observations, patient history, and epidemiological information. The expected result is Negative.  Fact Sheet for Patients:  EntrepreneurPulse.com.au  Fact Sheet for Healthcare Providers:  IncredibleEmployment.be  This test is no t yet approved or cleared by the [place] FDA and  has been authorized for detection and/or diagnosis of SARS-CoV-2 by FDA under an Emergency Use Authorization (EUA). This EUA will remain  in effect (meaning this test can be used)  for the duration of the COVID-19 declaration under Section 564(b)(1) of the Act, 21 U.S.C.section 360bbb-3(b)(1), unless the authorization is terminated  or revoked sooner.       Influenza A by PCR NEGATIVE NEGATIVE Final   Influenza B by PCR NEGATIVE NEGATIVE Final    Comment: (NOTE) The Xpert Xpress SARS-CoV-2/FLU/RSV plus assay is intended as an aid in the diagnosis of influenza from Nasopharyngeal swab specimens and should not be used as a sole basis for treatment. Nasal washings and aspirates are unacceptable for Xpert Xpress SARS-CoV-2/FLU/RSV testing.  Fact Sheet for Patients: EntrepreneurPulse.com.au  Fact Sheet for Healthcare Providers: IncredibleEmployment.be  This test is not yet approved or cleared by the [place] FDA and has been authorized for detection and/or diagnosis of SARS-CoV-2 by FDA under an Emergency Use Authorization (EUA). This EUA will remain in effect (meaning this test can be used) for the duration of the COVID-19 declaration under Section 564(b)(1) of the Act, 21 U.S.C. section 360bbb-3(b)(1), unless the authorization is terminated or revoked.  Performed at [facility], [address] [place], [place] [address]     Labs: CBC: Recent Labs  Lab 07/21/21 2225 07/22/21 0327 07/22/21 1800 07/22/21 1808 07/23/21 0622  WBC 4.9   5.3 6.2 5.3  --  4.3  NEUTROABS 1.5* 3.0  --   --   --   HGB 3.4*   3.3* 8.9* 8.8* 8.8* 8.7*  HCT 10.9*   10.9* 26.7* 26.3* 26.1* 25.9*  MCV 105.8*   107.9* 93.7 95.6  --  95.6  PLT 24*   27* 24* 21*  --  16*   Basic Metabolic Panel: Recent Labs  Lab 07/21/21 2225 07/22/21 0327 07/23/21 0622  NA 134* 132* 132*  K 3.9 4.1 3.9  CL 103 102 101  CO2 _0 GLUCOSE 119* 110* 100*  BUN 53* 48* 49*  CREATININE 1.74* 1.42* 1.09*  CALCIUM 7.1* 7.4* 7.7*  MG  --  2.2  --    Liver Function Tests: Recent Labs  Lab 07/22/21 0016 07/22/21 0327  AST 27 20   ALT 14 14  ALKPHOS 120 110  BILITOT 1.8* 1.3*  PROT 4.8* 5.1*  ALBUMIN 2.3* 2.4*   CBG: No results for input(s): GLUCAP in the last 168 hours.  Discharge time spent: 40 minutes.  Signed: Edwin Dada,  MD [facility] 07/23/2021

## 2021-07-24 LAB — PREPARE PLATELET PHERESIS: Unit division: 0

## 2021-07-24 LAB — BPAM PLATELET PHERESIS
Blood Product Expiration Date: 202302252359
ISSUE DATE / TIME: 202302251055
Unit Type and Rh: 5100

## 2021-07-25 LAB — COMP PANEL: LEUKEMIA/LYMPHOMA: Immunophenotypic Profile: 6

## 2021-07-29 ENCOUNTER — Other Ambulatory Visit: Payer: Self-pay | Admitting: Hospice and Palliative Medicine

## 2021-07-29 ENCOUNTER — Other Ambulatory Visit: Payer: Self-pay

## 2021-07-29 ENCOUNTER — Telehealth: Payer: Self-pay

## 2021-07-29 ENCOUNTER — Inpatient Hospital Stay: Payer: Medicare HMO | Admitting: Hospice and Palliative Medicine

## 2021-07-29 ENCOUNTER — Inpatient Hospital Stay: Payer: Medicare HMO | Attending: Oncology | Admitting: Oncology

## 2021-07-29 ENCOUNTER — Ambulatory Visit: Payer: Medicare HMO | Admitting: Oncology

## 2021-07-29 ENCOUNTER — Encounter: Payer: Self-pay | Admitting: Hospice and Palliative Medicine

## 2021-07-29 ENCOUNTER — Other Ambulatory Visit: Payer: Medicare HMO

## 2021-07-29 DIAGNOSIS — Z515 Encounter for palliative care: Secondary | ICD-10-CM

## 2021-07-29 LAB — SURGICAL PATHOLOGY

## 2021-07-29 NOTE — Telephone Encounter (Signed)
Spoke with patient's daughter.  Reportedly, patient is rapidly declining and is now mostly lethargic and bedbound.  Daughter does not think that patient will be able to participate in future decision making.  Daughter met today with nursing facility staff with plan to initiate hospice at SNF and focus on comfort care.  Daughter is interested in learning the results of the pathology when a final report is known. ?

## 2021-07-29 NOTE — Telephone Encounter (Signed)
I spoke with patient's nurse from the nursing facility and she stated that would like for [place] to fax over the results from the bone marrow biopsy and also her most recent xrays. Fax number [phone] ?

## 2021-07-31 ENCOUNTER — Encounter: Payer: Self-pay | Admitting: Oncology

## 2021-08-01 ENCOUNTER — Telehealth: Payer: Self-pay | Admitting: *Deleted

## 2021-08-01 ENCOUNTER — Encounter (HOSPITAL_COMMUNITY): Payer: Self-pay | Admitting: Oncology

## 2021-08-01 ENCOUNTER — Encounter: Payer: Self-pay | Admitting: Oncology

## 2021-08-01 NOTE — Telephone Encounter (Signed)
Daughter [name] called stating that no one ever gave patient/ family the bone Marrow results and hospice is saying that patient only has 2 weeks left to live and they would really like to know what her health problem is that is causing her illness. Please return her call. She states that patient is not always coherent. ? ?Component 10 d ago  ?SURGICAL PATHOLOGY Surgical Pathology  ?CASE: [id number]  ?PATIENT: Roberta [name]  ?Flow Pathology Report  ? ? ? ? ?Clinical history: Anemia  ? ? ?DIAGNOSIS:  ? ?-  Small kappa restricted B-cell population  ?-  Relative increase in NK-cells  ?-  No distinct blast population identified  ?-  See comment  ? ?COMMENT:  ? ?Overall, there is not a distinct atypical cellular population identified  ?by flow cytometry.  However, a kappa restricted B-cell population  ?without expression of CD5 or CD10 (7% of lymphocytes) was identified,  ?the significance of which is uncertain.  Additionally, CD56 positive  ?natural killer cells comprise 67% of all lymphocytes.  Please see  ?concurrent bone marrow biopsy ([phone]) for final diagnosis.  ? ?GATING AND PHENOTYPIC ANALYSIS:  ? ?Gated population: Flow cytometric immunophenotyping is performed using  ?antibodies to the antigens listed in the table below. Electronic gates  ?are placed around a cell cluster displaying light scatter properties  ?corresponding to: lymphocytes and blasts  ? ?Abnormal Cells in gated population: N/A  ? ?Phenotype of Abnormal Cells: N/A  ? ? ? ?                   Lymphoid Antigens       Myeloid Antigens  ?Miscellaneous  ?CD2  tested    CD10 tested    CD11b     tested    CD45 tested  ?CD3  tested    CD19 tested    CD11c     ND   HLA-Dr    tested  ?CD4  tested    CD20 tested    CD13 tested    CD34 tested  ?CD5  tested    CD22 ND   CD14 tested    CD38 tested  ?CD7  tested    CD79b     ND   CD15 tested    CD138     ND  ?CD8  tested    CD103     ND   CD16 tested    TdT  ND  ?CD25 ND   CD200     tested    CD33  tested    CD123     tested  ?TCRab     ND   sKappa    tested    CD64 tested    CD41 ND  ?TCRgd     tested    sLambda   tested    CD117     tested    CD61 ND  ?CD56 tested    cKappa    ND   MPO  ND   CD71 ND  ?CD57 ND   cLambda   ND             CD235a    ND  ? ? ?GROSS DESCRIPTION:  ? ?Reference bone marrow case [id number].  ? ? ? ?Final Diagnosis performed by Thressa Sheller, MD.   Electronically signed  ?07/29/2021  ?Technical component performed at [facility], [place]  ?[address] [place], [place] [address].  ? Professional component performed at [employer]. [facility],  ?  [address], [place], [place] [address].   ?Resulting Agency [facility]  ?  ? ?  ?  ?Specimen Collected: 07/22/21 Last Resulted: 07/29/21 13:46  ?  ?  ? ?Component 10 d ago  ?SURGICAL PATHOLOGY Surgical Pathology  ?CASE: [id number]  ?PATIENT: Roberta [name]  ?Bone Marrow Report  ? ? ? ? ? ? ? ? ? ?DIAGNOSIS:  ? ?BONE MARROW, ASPIRATE, CLOT, CORE:  ?-  Hypercellular marrow with increased blasts (15-20%), ring  ?sideroblasts and multilineage dyspoieisis  ?-  See comment and microscopic description below  ? ?PERIPHERAL BLOOD:  ?-  Normocytic anemia and thrombocytopenia  ?-  See complete blood cell count  ? ?COMMENT:  ? 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  Correlation with NGS/cytogenetics is recommended.  ? ?MICROSCOPIC DESCRIPTION:  ? ?PERIPHERAL BLOOD SMEAR: The peripheral blood has a normocytic anemia  ?with anisopoikilocytosis.  There is also a thrombocytopenia with  ?unremarkable-appearing platelets.  There are circulating blasts but no  ?Auer rods are identified.  Mild atypia is present in the maturing  ?granulocytes.  ? ?BONE MARROW ASPIRATE: Spicular, cellular and adequate for evaluation  ?Erythroid precursors: There is a  marked increase in erythroid precursors  ?with a skewed towards immaturity.  There is also marked atypia with  ?cytoplasmic vacuolization, nuclear irregularities,  late and atypical  ?mitotic activity.  ?Granulocytic precursors: Increased blasts (9% by manual differential  ?count) the blasts are small to medium in size with scant bluish-gray  ?cytoplasm, open chromatin and inconspicuous nucleoli.  Auer rods are not  ?identified late phase granulocytes are hypogranular.  ?Megakaryocytes: Present  ?Lymphocytes/plasma cells: No lymphocytosis or plasmacytosis  ? ?TOUCH PREPARATIONS: Cellular, with predominance of erythroid precursors  ?and blasts  ? ?CLOT AND BIOPSY: Examination of the bone marrow core biopsy and clot  ?section reveals a hypercellular marrow for age (> 95%) with erythroid  ?predominant hematopoiesis, increased immature mononuclear cells and  ?markedly atypical, hyperchromatic megakaryocytes.  E-cadherin highlights  ?the marked predominance of erythroid precursors while MPO shows limited  ?myeloid elements.  CD34 immunohistochemistry highlights increased blasts  ?(15 to 20%) and also labels some megakaryocytes.  TdT is negative.  ?PAX5 and CD20 highlights scattered B cells while CD3 shows a  ?predominance of T cells, no significant clustering of lymphocytes is  ?appreciated.  There are no granulomas identified.  ? ?IRON STAIN: Iron stains are performed on a bone marrow aspirate or touch  ?imprint smear and section of clot. The controls stained appropriately.  ? ?     Storage Iron: Present  ?     Ring Sideroblasts: Present (> 15%)  ? ?ADDITIONAL DATA/TESTING: NGS (myeloid panel and cytogenetics are pending  ?and will be reported in an addendum.  Flow cytometry performed on the  ?case ([id number]) did not highlight a significant blast population.  ? ?CELL COUNT DATA:  ? ?Bone Marrow count performed on 500 cells shows:  ?Blasts:   3%   Myeloid:  21%  ?Promyelocytes: 0%   Erythroid:     73%  ?Myelocytes:     12%  Lymphocytes:   3%  ?Metamyelocytes:     2%   Plasma cells:  2%  ?Bands:    0%  ?Neutrophils:   2%   M:E ratio:     0.29  ?Eosinophils:   2%  ?Basophils:  0%  ?Monocytes:     1%  ? ?Lab Data: CBC performed on 07/22/2021 shows:  ?WBC: 6.2 k/uL  Neutrophils:   40%  ?Hgb: 8.9 g/dL  Lymphocytes:   31%  ?HCT: 26.7 %    Monocytes:     13%  ?MCV: 93.7 fL   Eosinophils:   0%  ?RDW: 18.5 %    Basophils:     0%  ?PLT: 24 k/uL  ? ?GROSS DESCRIPTION:  ? ?A. Aspirate smear.  ? ?B. Received in B-plus labeled with the patient's name and DOB is a 2.7 x  ?0.8 x 0.4 cm blood clot, submitted in toto in cassette B1.  ? ?C. Received in B-plus labeled with the patient's name and DOB is a 1.8 x  ?0.2 cm core(s) of tan-brown, trabeculated, firm bone, submitted in toto  ?in cassette C1 after decalcification in Immunocal.  ? ?(LEF 07/22/2021)  ? ? ?Final Diagnosis performed by Thressa Sheller, MD.   Electronically signed  ?07/29/2021  ?Technical component performed at [facility], [place]  ?[address] [place], [place] [address].  ? Professional component performed at [facility] [facility]. [place] [address] [place], [place] [address].  ? Immunohistochemistry Technical component (if applicable) was performed  ?at [facility]. [place], [address],  ?[place],  [address].   IMMUNOHISTOCHEMISTRY DISCLAIMER (if applicable):  ?Some of these immunohistochemical stains may have been developed and the  ?performance characteristics determine by [facility]. Some  ?may not have been cleared or approved by the U.S. Food and Drug  ?Administration. The FDA has determined that such clearance or approval  ?is not necessary. This test is used for clinical purposes. It should not  ?be regarded as investigational or for research. This laboratory is  ?certified under the Clinical Laboratory Improvement Amendments of 1988  ?(CLIA-88) as qualified to perform high complexity clinical laboratory   ?testing.  The controls stained appropriately.   ?Resulting Agency [facility]  ?  ? ?  ?  ?Specimen Collected: 07/22/21 Last Resulted: 07/29/21 13:45  ?  ?  ? ?

## 2021-08-01 NOTE — Telephone Encounter (Signed)
Dr Janese Banks did not have the bone marrow results coming to her inbasket until today, she called the daughter and daughter agreeable for hospice and pt was added hospice today in nursing home ?

## 2021-08-03 LAB — TYPE AND SCREEN
ABO/RH(D): A NEG
Antibody Screen: NEGATIVE
Unit division: 0
Unit division: 0
Unit division: 0

## 2021-08-03 LAB — BPAM RBC
Blood Product Expiration Date: 202303082359
Blood Product Expiration Date: 202303082359
Blood Product Expiration Date: 202303272359
ISSUE DATE / TIME: 202302232247
ISSUE DATE / TIME: 202302240008
ISSUE DATE / TIME: 202302240418
Unit Type and Rh: 600
Unit Type and Rh: 600
Unit Type and Rh: 600

## 2021-08-03 LAB — PREPARE RBC (CROSSMATCH)

## 2021-08-08 ENCOUNTER — Encounter (HOSPITAL_COMMUNITY): Payer: Self-pay | Admitting: Oncology

## 2021-08-10 LAB — SURGICAL PATHOLOGY

## 2021-08-27 DEATH — deceased
